# Patient Record
Sex: Female | Born: 1950
Health system: Southern US, Community
[De-identification: ages and names within clinical notes are randomized; demographics above are authoritative.]

## PROBLEM LIST (undated history)

## (undated) DIAGNOSIS — E7800 Pure hypercholesterolemia, unspecified: Secondary | ICD-10-CM

## (undated) DIAGNOSIS — A048 Other specified bacterial intestinal infections: Secondary | ICD-10-CM

## (undated) DIAGNOSIS — Z923 Personal history of irradiation: Secondary | ICD-10-CM

## (undated) DIAGNOSIS — J302 Other seasonal allergic rhinitis: Secondary | ICD-10-CM

## (undated) DIAGNOSIS — M4800 Spinal stenosis, site unspecified: Secondary | ICD-10-CM

## (undated) DIAGNOSIS — M5136 Other intervertebral disc degeneration, lumbar region: Secondary | ICD-10-CM

## (undated) DIAGNOSIS — I10 Essential (primary) hypertension: Secondary | ICD-10-CM

## (undated) DIAGNOSIS — R7303 Prediabetes: Secondary | ICD-10-CM

## (undated) DIAGNOSIS — E785 Hyperlipidemia, unspecified: Secondary | ICD-10-CM

## (undated) DIAGNOSIS — D126 Benign neoplasm of colon, unspecified: Secondary | ICD-10-CM

## (undated) DIAGNOSIS — IMO0001 Reserved for inherently not codable concepts without codable children: Secondary | ICD-10-CM

## (undated) DIAGNOSIS — C801 Malignant (primary) neoplasm, unspecified: Secondary | ICD-10-CM

## (undated) DIAGNOSIS — D563 Thalassemia minor: Secondary | ICD-10-CM

## (undated) DIAGNOSIS — R112 Nausea with vomiting, unspecified: Secondary | ICD-10-CM

## (undated) DIAGNOSIS — D259 Leiomyoma of uterus, unspecified: Secondary | ICD-10-CM

## (undated) DIAGNOSIS — L509 Urticaria, unspecified: Secondary | ICD-10-CM

## (undated) DIAGNOSIS — E039 Hypothyroidism, unspecified: Secondary | ICD-10-CM

## (undated) DIAGNOSIS — D242 Benign neoplasm of left breast: Secondary | ICD-10-CM

## (undated) DIAGNOSIS — M5430 Sciatica, unspecified side: Secondary | ICD-10-CM

## (undated) DIAGNOSIS — M51369 Other intervertebral disc degeneration, lumbar region without mention of lumbar back pain or lower extremity pain: Secondary | ICD-10-CM

## (undated) DIAGNOSIS — T7840XA Allergy, unspecified, initial encounter: Secondary | ICD-10-CM

## (undated) DIAGNOSIS — I4891 Unspecified atrial fibrillation: Secondary | ICD-10-CM

## (undated) DIAGNOSIS — K219 Gastro-esophageal reflux disease without esophagitis: Secondary | ICD-10-CM

## (undated) DIAGNOSIS — Z9889 Other specified postprocedural states: Secondary | ICD-10-CM

## (undated) HISTORY — DX: Nausea with vomiting, unspecified: R11.2

## (undated) HISTORY — DX: Gastro-esophageal reflux disease without esophagitis: K21.9

## (undated) HISTORY — DX: Thalassemia minor: D56.3

## (undated) HISTORY — DX: Other intervertebral disc degeneration, lumbar region: M51.36

## (undated) HISTORY — DX: Hyperlipidemia, unspecified: E78.5

## (undated) HISTORY — DX: Other specified postprocedural states: Z98.890

## (undated) HISTORY — DX: Reserved for inherently not codable concepts without codable children: IMO0001

## (undated) HISTORY — DX: Spinal stenosis, site unspecified: M48.00

## (undated) HISTORY — DX: Hypothyroidism, unspecified: E03.9

## (undated) HISTORY — DX: Personal history of irradiation: Z92.3

## (undated) HISTORY — DX: Allergy, unspecified, initial encounter: T78.40XA

## (undated) HISTORY — DX: Prediabetes: R73.03

## (undated) HISTORY — DX: Urticaria, unspecified: L50.9

## (undated) HISTORY — PX: POLYPECTOMY: SHX149

## (undated) HISTORY — DX: Benign neoplasm of colon, unspecified: D12.6

## (undated) HISTORY — DX: Unspecified atrial fibrillation: I48.91

## (undated) HISTORY — DX: Other seasonal allergic rhinitis: J30.2

## (undated) HISTORY — DX: Leiomyoma of uterus, unspecified: D25.9

## (undated) HISTORY — DX: Benign neoplasm of left breast: D24.2

## (undated) HISTORY — DX: Essential (primary) hypertension: I10

## (undated) HISTORY — DX: Other intervertebral disc degeneration, lumbar region without mention of lumbar back pain or lower extremity pain: M51.369

## (undated) HISTORY — PX: CHOLECYSTECTOMY: SHX55

## (undated) HISTORY — PX: COLONOSCOPY: SHX174

## (undated) HISTORY — DX: Sciatica, unspecified side: M54.30

## (undated) HISTORY — DX: Other specified bacterial intestinal infections: A04.8

---
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
## 2002-01-28 ENCOUNTER — Emergency Department (HOSPITAL_COMMUNITY): Admission: EM | Admit: 2002-01-28 | Discharge: 2002-01-29 | Payer: Self-pay | Admitting: Emergency Medicine

## 2002-01-28 ENCOUNTER — Encounter: Payer: Self-pay | Admitting: Emergency Medicine

## 2002-07-07 ENCOUNTER — Other Ambulatory Visit: Admission: RE | Admit: 2002-07-07 | Discharge: 2002-07-07 | Payer: Self-pay | Admitting: Obstetrics and Gynecology

## 2002-07-27 ENCOUNTER — Emergency Department (HOSPITAL_COMMUNITY): Admission: EM | Admit: 2002-07-27 | Discharge: 2002-07-27 | Payer: Self-pay | Admitting: Emergency Medicine

## 2002-07-27 ENCOUNTER — Encounter: Payer: Self-pay | Admitting: Emergency Medicine

## 2003-04-15 ENCOUNTER — Emergency Department (HOSPITAL_COMMUNITY): Admission: EM | Admit: 2003-04-15 | Discharge: 2003-04-15 | Payer: Self-pay | Admitting: Emergency Medicine

## 2003-04-15 ENCOUNTER — Encounter: Payer: Self-pay | Admitting: Emergency Medicine

## 2003-08-01 ENCOUNTER — Encounter: Payer: Self-pay | Admitting: Emergency Medicine

## 2003-08-01 ENCOUNTER — Emergency Department (HOSPITAL_COMMUNITY): Admission: AD | Admit: 2003-08-01 | Discharge: 2003-08-01 | Payer: Self-pay

## 2003-12-02 ENCOUNTER — Emergency Department (HOSPITAL_COMMUNITY): Admission: EM | Admit: 2003-12-02 | Discharge: 2003-12-03 | Payer: Self-pay

## 2003-12-30 ENCOUNTER — Encounter: Admission: RE | Admit: 2003-12-30 | Discharge: 2003-12-30 | Payer: Self-pay | Admitting: Internal Medicine

## 2004-05-31 ENCOUNTER — Encounter: Admission: RE | Admit: 2004-05-31 | Discharge: 2004-05-31 | Payer: Self-pay | Admitting: Internal Medicine

## 2004-06-01 ENCOUNTER — Encounter: Admission: RE | Admit: 2004-06-01 | Discharge: 2004-06-01 | Payer: Self-pay | Admitting: Internal Medicine

## 2004-10-13 ENCOUNTER — Ambulatory Visit: Payer: Self-pay | Admitting: Internal Medicine

## 2004-12-01 ENCOUNTER — Ambulatory Visit: Payer: Self-pay | Admitting: Internal Medicine

## 2005-03-05 ENCOUNTER — Ambulatory Visit: Payer: Self-pay | Admitting: Internal Medicine

## 2005-07-10 ENCOUNTER — Emergency Department (HOSPITAL_COMMUNITY): Admission: EM | Admit: 2005-07-10 | Discharge: 2005-07-11 | Payer: Self-pay | Admitting: Emergency Medicine

## 2005-07-17 ENCOUNTER — Ambulatory Visit: Payer: Self-pay

## 2005-07-19 ENCOUNTER — Ambulatory Visit: Payer: Self-pay | Admitting: Internal Medicine

## 2005-07-26 ENCOUNTER — Ambulatory Visit: Payer: Self-pay | Admitting: Internal Medicine

## 2005-10-29 ENCOUNTER — Ambulatory Visit: Payer: Self-pay | Admitting: Internal Medicine

## 2005-12-17 ENCOUNTER — Ambulatory Visit: Payer: Self-pay | Admitting: Internal Medicine

## 2005-12-19 ENCOUNTER — Ambulatory Visit: Payer: Self-pay | Admitting: Internal Medicine

## 2006-02-04 ENCOUNTER — Ambulatory Visit: Payer: Self-pay | Admitting: Internal Medicine

## 2006-03-01 ENCOUNTER — Ambulatory Visit: Payer: Self-pay | Admitting: Internal Medicine

## 2006-03-06 ENCOUNTER — Ambulatory Visit: Payer: Self-pay | Admitting: Internal Medicine

## 2006-03-13 ENCOUNTER — Ambulatory Visit: Payer: Self-pay | Admitting: Gastroenterology

## 2006-04-18 ENCOUNTER — Ambulatory Visit: Payer: Self-pay | Admitting: Internal Medicine

## 2006-04-26 ENCOUNTER — Ambulatory Visit: Payer: Self-pay | Admitting: Internal Medicine

## 2006-05-28 ENCOUNTER — Ambulatory Visit: Payer: Self-pay | Admitting: Internal Medicine

## 2006-09-17 ENCOUNTER — Ambulatory Visit: Payer: Self-pay | Admitting: Internal Medicine

## 2006-10-10 DIAGNOSIS — E669 Obesity, unspecified: Secondary | ICD-10-CM | POA: Insufficient documentation

## 2006-10-10 DIAGNOSIS — E785 Hyperlipidemia, unspecified: Secondary | ICD-10-CM | POA: Insufficient documentation

## 2006-10-10 DIAGNOSIS — K8020 Calculus of gallbladder without cholecystitis without obstruction: Secondary | ICD-10-CM | POA: Insufficient documentation

## 2006-10-10 DIAGNOSIS — D259 Leiomyoma of uterus, unspecified: Secondary | ICD-10-CM | POA: Insufficient documentation

## 2006-11-05 ENCOUNTER — Emergency Department (HOSPITAL_COMMUNITY): Admission: EM | Admit: 2006-11-05 | Discharge: 2006-11-06 | Payer: Self-pay | Admitting: Emergency Medicine

## 2006-11-08 ENCOUNTER — Encounter (INDEPENDENT_AMBULATORY_CARE_PROVIDER_SITE_OTHER): Payer: Self-pay | Admitting: *Deleted

## 2006-11-08 ENCOUNTER — Ambulatory Visit: Payer: Self-pay | Admitting: Internal Medicine

## 2006-11-08 LAB — CONVERTED CEMR LAB
ALT: 12 units/L (ref 0–35)
AST: 19 units/L (ref 0–37)
Basophils Absolute: 0 10*3/uL (ref 0.0–0.1)
Basophils percent auto: 0 % (ref 0–1)
Creatinine, Ser: 1 mg/dL (ref 0.40–1.20)
Eosinophils Relative: 1 % (ref 0–4)
HCT: 38.9 % (ref 34.4–43.3)
Hemoglobin: 12.7 g/dL (ref 11.7–14.8)
MCHC: 32.6 g/dL — ABNORMAL LOW (ref 33.1–35.4)
Monocytes Absolute: 0.4 10*3/uL (ref 0.2–0.7)
Neutro Abs: 2.4 10*3/uL (ref 1.8–6.8)
RDW: 18.7 % — ABNORMAL HIGH (ref 11.5–15.3)
Total Bilirubin: 0.8 mg/dL (ref 0.3–1.2)

## 2006-11-11 ENCOUNTER — Encounter (INDEPENDENT_AMBULATORY_CARE_PROVIDER_SITE_OTHER): Payer: Self-pay | Admitting: *Deleted

## 2006-11-11 ENCOUNTER — Ambulatory Visit: Payer: Self-pay | Admitting: Hospitalist

## 2006-11-11 LAB — CONVERTED CEMR LAB
Total CHOL/HDL Ratio: 4.8
VLDL: 13 mg/dL (ref 0–40)

## 2006-12-02 ENCOUNTER — Ambulatory Visit: Payer: Self-pay | Admitting: Internal Medicine

## 2007-01-28 DIAGNOSIS — K219 Gastro-esophageal reflux disease without esophagitis: Secondary | ICD-10-CM | POA: Insufficient documentation

## 2007-03-05 ENCOUNTER — Ambulatory Visit: Payer: Self-pay | Admitting: Internal Medicine

## 2007-05-06 ENCOUNTER — Ambulatory Visit: Payer: Self-pay | Admitting: Internal Medicine

## 2007-05-06 ENCOUNTER — Encounter (INDEPENDENT_AMBULATORY_CARE_PROVIDER_SITE_OTHER): Payer: Self-pay | Admitting: *Deleted

## 2007-05-06 DIAGNOSIS — M25569 Pain in unspecified knee: Secondary | ICD-10-CM | POA: Insufficient documentation

## 2007-05-06 LAB — CONVERTED CEMR LAB
AST: 13 units/L (ref 0–37)
Albumin: 4.3 g/dL (ref 3.5–5.2)
Alkaline Phosphatase: 91 units/L (ref 39–117)
BUN: 12 mg/dL (ref 6–23)
HDL: 72 mg/dL (ref >39)
Ketones, ur: NEGATIVE mg/dL
LDL Cholesterol: 187 mg/dL — ABNORMAL HIGH (ref 0–99)
Nitrite: NEGATIVE
Potassium: 3.7 meq/L (ref 3.5–5.3)
Protein, ur: NEGATIVE mg/dL
Sodium: 140 meq/L (ref 135–145)
Specific Gravity, Urine: 1.025 (ref 1.005–1.03)
TSH: 3.03 microintl units/mL (ref 0.350–5.50)
Total Protein: 7.5 g/dL (ref 6.0–8.3)
Urobilinogen, UA: 1 (ref 0.0–1.0)
VLDL: 16 mg/dL (ref 0–40)

## 2007-05-07 ENCOUNTER — Ambulatory Visit (HOSPITAL_COMMUNITY): Admission: RE | Admit: 2007-05-07 | Discharge: 2007-05-07 | Payer: Self-pay | Admitting: Internal Medicine

## 2007-07-11 ENCOUNTER — Encounter (INDEPENDENT_AMBULATORY_CARE_PROVIDER_SITE_OTHER): Payer: Self-pay | Admitting: *Deleted

## 2007-07-11 ENCOUNTER — Ambulatory Visit: Payer: Self-pay | Admitting: Internal Medicine

## 2007-12-11 ENCOUNTER — Ambulatory Visit: Payer: Self-pay | Admitting: Infectious Diseases

## 2007-12-11 ENCOUNTER — Encounter (INDEPENDENT_AMBULATORY_CARE_PROVIDER_SITE_OTHER): Payer: Self-pay | Admitting: *Deleted

## 2007-12-11 LAB — CONVERTED CEMR LAB
AST: 16 units/L (ref 0–37)
Albumin: 3.9 g/dL (ref 3.5–5.2)
Alkaline Phosphatase: 79 units/L (ref 39–117)
BUN: 15 mg/dL (ref 6–23)
Creatinine, Ser: 0.89 mg/dL (ref 0.40–1.20)
HDL: 71 mg/dL (ref >39)
LDL Cholesterol: 163 mg/dL — ABNORMAL HIGH (ref 0–99)
Potassium: 4.2 meq/L (ref 3.5–5.3)
Total Bilirubin: 0.5 mg/dL (ref 0.3–1.2)
Total CHOL/HDL Ratio: 3.5
VLDL: 13 mg/dL (ref 0–40)

## 2007-12-18 ENCOUNTER — Encounter (INDEPENDENT_AMBULATORY_CARE_PROVIDER_SITE_OTHER): Payer: Self-pay | Admitting: *Deleted

## 2007-12-18 ENCOUNTER — Ambulatory Visit: Payer: Self-pay | Admitting: Hospitalist

## 2007-12-29 ENCOUNTER — Telehealth (INDEPENDENT_AMBULATORY_CARE_PROVIDER_SITE_OTHER): Payer: Self-pay | Admitting: *Deleted

## 2008-01-06 ENCOUNTER — Encounter (INDEPENDENT_AMBULATORY_CARE_PROVIDER_SITE_OTHER): Payer: Self-pay | Admitting: *Deleted

## 2008-02-19 ENCOUNTER — Encounter (INDEPENDENT_AMBULATORY_CARE_PROVIDER_SITE_OTHER): Payer: Self-pay | Admitting: *Deleted

## 2008-02-19 ENCOUNTER — Ambulatory Visit: Payer: Self-pay | Admitting: Internal Medicine

## 2008-02-23 ENCOUNTER — Ambulatory Visit (HOSPITAL_COMMUNITY): Admission: RE | Admit: 2008-02-23 | Discharge: 2008-02-23 | Payer: Self-pay | Admitting: *Deleted

## 2008-02-23 LAB — CONVERTED CEMR LAB
ALT: 10 units/L (ref 0–35)
Alkaline Phosphatase: 83 units/L (ref 39–117)
Creatinine, Ser: 0.93 mg/dL (ref 0.40–1.20)
LDL Cholesterol: 150 mg/dL — ABNORMAL HIGH (ref 0–99)
Sodium: 141 meq/L (ref 135–145)
Total Bilirubin: 0.7 mg/dL (ref 0.3–1.2)
Total CHOL/HDL Ratio: 3.4
Total Protein: 7.1 g/dL (ref 6.0–8.3)
VLDL: 12 mg/dL (ref 0–40)

## 2008-03-03 ENCOUNTER — Encounter (INDEPENDENT_AMBULATORY_CARE_PROVIDER_SITE_OTHER): Payer: Self-pay | Admitting: *Deleted

## 2008-03-31 ENCOUNTER — Ambulatory Visit: Payer: Self-pay | Admitting: Internal Medicine

## 2008-05-03 ENCOUNTER — Telehealth: Payer: Self-pay | Admitting: Internal Medicine

## 2008-06-17 ENCOUNTER — Telehealth: Payer: Self-pay | Admitting: Internal Medicine

## 2008-06-24 ENCOUNTER — Encounter (INDEPENDENT_AMBULATORY_CARE_PROVIDER_SITE_OTHER): Payer: Self-pay | Admitting: *Deleted

## 2008-06-24 ENCOUNTER — Ambulatory Visit: Payer: Self-pay | Admitting: Internal Medicine

## 2008-06-24 LAB — CONVERTED CEMR LAB
AST: 16 units/L (ref 0–37)
Albumin: 4.2 g/dL (ref 3.5–5.2)
Alkaline Phosphatase: 81 units/L (ref 39–117)
BUN: 14 mg/dL (ref 6–23)
HDL: 67 mg/dL (ref >39)
LDL Cholesterol: 143 mg/dL — ABNORMAL HIGH (ref 0–99)
Potassium: 4.1 meq/L (ref 3.5–5.3)
Sodium: 141 meq/L (ref 135–145)
Total Bilirubin: 0.6 mg/dL (ref 0.3–1.2)
Total Protein: 7.1 g/dL (ref 6.0–8.3)
Triglycerides: 61 mg/dL (ref <150)
VLDL: 12 mg/dL (ref 0–40)

## 2008-07-14 ENCOUNTER — Ambulatory Visit: Payer: Self-pay | Admitting: Internal Medicine

## 2008-08-30 ENCOUNTER — Telehealth (INDEPENDENT_AMBULATORY_CARE_PROVIDER_SITE_OTHER): Payer: Self-pay | Admitting: *Deleted

## 2008-10-04 ENCOUNTER — Emergency Department (HOSPITAL_COMMUNITY): Admission: EM | Admit: 2008-10-04 | Discharge: 2008-10-04 | Payer: Self-pay | Admitting: Emergency Medicine

## 2008-10-04 ENCOUNTER — Ambulatory Visit: Payer: Self-pay | Admitting: *Deleted

## 2008-10-29 ENCOUNTER — Telehealth (INDEPENDENT_AMBULATORY_CARE_PROVIDER_SITE_OTHER): Payer: Self-pay | Admitting: *Deleted

## 2008-11-23 ENCOUNTER — Ambulatory Visit: Payer: Self-pay | Admitting: *Deleted

## 2009-03-12 ENCOUNTER — Emergency Department (HOSPITAL_COMMUNITY): Admission: EM | Admit: 2009-03-12 | Discharge: 2009-03-12 | Payer: Self-pay | Admitting: Family Medicine

## 2009-03-17 ENCOUNTER — Telehealth: Payer: Self-pay | Admitting: *Deleted

## 2009-05-31 ENCOUNTER — Ambulatory Visit: Payer: Self-pay | Admitting: Internal Medicine

## 2009-05-31 ENCOUNTER — Encounter (INDEPENDENT_AMBULATORY_CARE_PROVIDER_SITE_OTHER): Payer: Self-pay | Admitting: *Deleted

## 2009-06-10 ENCOUNTER — Ambulatory Visit: Payer: Self-pay | Admitting: Internal Medicine

## 2009-06-10 ENCOUNTER — Encounter (INDEPENDENT_AMBULATORY_CARE_PROVIDER_SITE_OTHER): Payer: Self-pay | Admitting: *Deleted

## 2009-06-10 LAB — CONVERTED CEMR LAB
CO2: 25 meq/L (ref 19–32)
Calcium: 9.1 mg/dL (ref 8.4–10.5)
Cholesterol: 221 mg/dL — ABNORMAL HIGH (ref 0–200)
Creatinine, Ser: 0.96 mg/dL (ref 0.40–1.20)
GFR calc non Af Amer: 60 mL/min — ABNORMAL LOW (ref >60)
Sodium: 144 meq/L (ref 135–145)
Triglycerides: 65 mg/dL (ref <150)
VLDL: 13 mg/dL (ref 0–40)

## 2009-06-13 ENCOUNTER — Ambulatory Visit: Payer: Self-pay | Admitting: Internal Medicine

## 2009-07-01 ENCOUNTER — Telehealth: Payer: Self-pay | Admitting: *Deleted

## 2009-07-01 ENCOUNTER — Ambulatory Visit: Payer: Self-pay | Admitting: Infectious Diseases

## 2009-07-01 ENCOUNTER — Encounter: Payer: Self-pay | Admitting: Internal Medicine

## 2009-07-01 DIAGNOSIS — R109 Unspecified abdominal pain: Secondary | ICD-10-CM | POA: Insufficient documentation

## 2009-07-01 LAB — CONVERTED CEMR LAB
Bilirubin Urine: NEGATIVE
Gardnerella vaginalis: NEGATIVE
Ketones, ur: NEGATIVE mg/dL
Protein, ur: NEGATIVE mg/dL
Urobilinogen, UA: 0.2 (ref 0.0–1.0)

## 2009-07-18 ENCOUNTER — Encounter: Payer: Self-pay | Admitting: Internal Medicine

## 2009-08-04 ENCOUNTER — Ambulatory Visit (HOSPITAL_COMMUNITY): Admission: RE | Admit: 2009-08-04 | Discharge: 2009-08-04 | Payer: Self-pay | Admitting: Internal Medicine

## 2009-08-09 ENCOUNTER — Telehealth: Payer: Self-pay | Admitting: Internal Medicine

## 2009-09-13 ENCOUNTER — Encounter: Payer: Self-pay | Admitting: Internal Medicine

## 2009-11-08 ENCOUNTER — Telehealth: Payer: Self-pay | Admitting: Internal Medicine

## 2009-11-29 ENCOUNTER — Encounter: Payer: Self-pay | Admitting: Internal Medicine

## 2010-02-23 ENCOUNTER — Ambulatory Visit: Payer: Self-pay | Admitting: Internal Medicine

## 2010-02-23 DIAGNOSIS — M545 Low back pain, unspecified: Secondary | ICD-10-CM | POA: Insufficient documentation

## 2010-02-23 LAB — CONVERTED CEMR LAB
Albumin: 4 g/dL (ref 3.5–5.2)
CO2: 27 meq/L (ref 19–32)
Cholesterol: 219 mg/dL — ABNORMAL HIGH (ref 0–200)
Glucose, Bld: 82 mg/dL (ref 70–99)
LDL Cholesterol: 146 mg/dL — ABNORMAL HIGH (ref 0–99)
Potassium: 4.3 meq/L (ref 3.5–5.3)
Sodium: 139 meq/L (ref 135–145)
Total Protein: 6.9 g/dL (ref 6.0–8.3)
Triglycerides: 58 mg/dL (ref <150)

## 2010-03-24 ENCOUNTER — Telehealth: Payer: Self-pay | Admitting: Internal Medicine

## 2010-04-07 ENCOUNTER — Telehealth: Payer: Self-pay | Admitting: Internal Medicine

## 2010-06-20 ENCOUNTER — Encounter: Payer: Self-pay | Admitting: Internal Medicine

## 2010-07-27 ENCOUNTER — Encounter: Admission: RE | Admit: 2010-07-27 | Discharge: 2010-07-27 | Payer: Self-pay | Admitting: Internal Medicine

## 2010-09-14 ENCOUNTER — Ambulatory Visit: Payer: Self-pay | Admitting: Internal Medicine

## 2010-09-14 LAB — CONVERTED CEMR LAB
HDL: 67 mg/dL (ref >39)
Total CHOL/HDL Ratio: 3.5
Triglycerides: 73 mg/dL (ref <150)

## 2011-01-04 ENCOUNTER — Emergency Department (HOSPITAL_COMMUNITY)
Admission: EM | Admit: 2011-01-04 | Discharge: 2011-01-04 | Payer: Self-pay | Source: Home / Self Care | Admitting: Emergency Medicine

## 2011-01-08 ENCOUNTER — Ambulatory Visit: Admission: RE | Admit: 2011-01-08 | Discharge: 2011-01-08 | Payer: Self-pay | Source: Home / Self Care

## 2011-01-08 DIAGNOSIS — G5620 Lesion of ulnar nerve, unspecified upper limb: Secondary | ICD-10-CM | POA: Insufficient documentation

## 2011-01-08 DIAGNOSIS — J069 Acute upper respiratory infection, unspecified: Secondary | ICD-10-CM | POA: Insufficient documentation

## 2011-01-22 LAB — CONVERTED CEMR LAB
ALT: 12 units/L (ref 0–35)
AST: 15 units/L (ref 0–37)
Alkaline Phosphatase: 90 units/L (ref 39–117)
CO2: 27 meq/L (ref 19–32)
Cholesterol: 245 mg/dL — ABNORMAL HIGH (ref 0–200)
LDL Cholesterol: 168 mg/dL — ABNORMAL HIGH (ref 0–99)
MCV: 75.8 fL — ABNORMAL LOW (ref 78.0–100.0)
Platelets: 241 10*3/uL (ref 150–400)
RDW: 16.3 % — ABNORMAL HIGH (ref 11.5–15.5)
Sodium: 141 meq/L (ref 135–145)
Total Bilirubin: 0.5 mg/dL (ref 0.3–1.2)
Total CHOL/HDL Ratio: 4
Total Protein: 6.8 g/dL (ref 6.0–8.3)
VLDL: 15 mg/dL (ref 0–40)
WBC: 5.2 10*3/uL (ref 4.0–10.5)

## 2011-01-30 NOTE — Medication Information (Signed)
Summary: [facility]   Imported By: Margie Billet 07/10/2010 15:58:32  _____________________________________________________________________  External Attachment:    Type:   Image     Comment:   External Document

## 2011-01-30 NOTE — Assessment & Plan Note (Signed)
Summary: CHECKUP/SB.   Vital Signs:  Patient profile:   61 year old female Menstrual status:  postmenopausal Height:      64 inches Weight:      247.4 pounds BMI:     42.62 Temp:     97.8 degrees F oral Pulse rate:   80 / minute BP sitting:   143 / 88  (right arm)  Vitals Entered By: Filomena Jungling NT II (September 14, 2010 1:45 PM) CC: checkup Is Patient Diabetic? No Pain Assessment Patient in pain? no      Nutritional Status BMI of > 30 = obese  Have you ever been in a relationship where you felt threatened, hurt or afraid?No   Does patient need assistance? Functional Status Self care Ambulation Normal   CC:  checkup.  History of Present Illness: Tina Stone is a 61 year old woman with pmh significant for Uterine Fibroids, HLD and GERD who presents to the clinic today for a general check-up.   Pt states she is feeling great. She has started swimming every morning. She also changed her diet to fish and chicken and increased her vegetable intake for the past 3 weeks.  She is at a new job and is enjoying it. No further episodes of back pain.      Preventive Screening-Counseling & Management  Alcohol-Tobacco     Alcohol drinks/day: 0     Smoking Status: quit     Year Quit: years ago-teenager  Caffeine-Diet-Exercise     Caffeine use/day: very little     Does Patient Exercise: yes before the back pain staarted     Type of exercise: walking,jogging     Exercise (avg: min/session): 30-60     Times/week: 5  Current Medications (verified): 1)  Pravachol 10 Mg Tabs (Pravastatin Sodium) .... Take 1 Tablet By Mouth Once A Day 2)  Calcium Carbonate 600 Mg Tabs (Calcium Carbonate) 3)  Garlic Oil 500 Mg Tabs (Garlic) .... Once Daily  Allergies (verified): 1)  ! * Shellfish  Past History:  Past Medical History: Last updated: 07/01/2009 GERD      resolved w/ PPI but stopped Prilosec b/c concerned about possible heart side-effects (TV)      resolved with lose of  weight Hyperlipidemia      Hypertension      Resolved after losing weight Cardiomegaly as per x-ray 2004 Cholelithiasis      Mild biliary colic Obesity      losing wt. 253 (Mar 2008) 245 (Jul 08) fibroids Health screening      Scheduling a Colonoscopy Mar 09  Family History: Last updated: 07/01/2009 Family History Diabetes 1st degree relative Family History Hypertension  Social History: Last updated: 09/14/2010 Widow/Widower Currently employed as caregiver at [employer] No tobacco, EtOH, or illicit drug use Regular exercise  Risk Factors: Alcohol Use: 0 (09/14/2010) Caffeine Use: very little (09/14/2010) Exercise: yes before the back pain staarted (09/14/2010)  Risk Factors: Smoking Status: quit (09/14/2010)  Social History: Widow/Widower Currently employed as caregiver at [employer] No tobacco, EtOH, or illicit drug use Regular exercise  Review of Systems      See HPI  Physical Exam  General:  alert and well-developed.   Head:  normocephalic and atraumatic.   Neck:  supple.   Lungs:  normal respiratory effort and normal breath sounds.   Heart:  normal rate and regular rhythm.   Abdomen:  soft, non-tender, and normal bowel sounds.   Msk:  normal ROM.  Pulses:  equal bilat Extremities:  no edema  Psych:  normally interactive and good eye contact.     Impression & Recommendations:  Problem # 1:  BACK PAIN, LUMBAR (ICD-724.2) resolved. Was work related, now at different job.   The following medications were removed from the medication list:    Flexeril 5 Mg Tabs (Cyclobenzaprine hcl) .Marland Kitchen... Take 1 tab by mouth at bedtime  Problem # 2:  HYPERLIPIDEMIA (ICD-272.4) Check lipid profile today and continue current regimen.   Her updated medication list for this problem includes:    Pravachol 10 Mg Tabs (Pravastatin sodium) .Marland Kitchen... Take 1 tablet by mouth once a day  Orders: T-Lipid Profile [phone])  Labs Reviewed: SGOT: 18  (02/23/2010)   SGPT: 12 (02/23/2010)   HDL:61 (02/23/2010), 62 (06/10/2009)  LDL:146 (02/23/2010), 146 (06/10/2009)  Chol:219 (02/23/2010), 221 (06/10/2009)  Trig:58 (02/23/2010), 65 (06/10/2009)  Problem # 3:  Preventive Health Care (ICD-V70.0) flu shot today PAP will be scheduled at next office visit.   Complete Medication List: 1)  Pravachol 10 Mg Tabs (Pravastatin sodium) .... Take 1 tablet by mouth once a day 2)  Calcium Carbonate 600 Mg Tabs (Calcium carbonate) 3)  Garlic Oil 500 Mg Tabs (Garlic) .... Once daily  Patient Instructions: 1)  Please schedule a follow-up appointment in 6 months. 2)  Please come in fasting for your appointment. 3)  Please have PAP Smear done at next office visit.  Process Orders Check Orders Results:     [facility]: ABN not required for this insurance Tests Sent for requisitioning (September 14, 2010 2:14 PM):     09/14/2010: [facility] -- T-Lipid Profile [phone] (signed)     Process Orders Check Orders Results:     [facility]: ABN not required for this insurance Tests Sent for requisitioning (September 14, 2010 2:14 PM):     09/14/2010: [facility] -- T-Lipid Profile [phone] (signed)     Prevention & Chronic Care Immunizations   Influenza vaccine: Not documented   Influenza vaccine deferral: Refused  (02/23/2010)    Tetanus booster: Not documented    Pneumococcal vaccine: Not documented  Colorectal Screening   Hemoccult: Not documented    Colonoscopy: Not documented  Other Screening   Pap smear: Not documented   Pap smear action/deferral: Refused  (09/14/2010)    Mammogram: BI-RADS CATEGORY 2:  Benign finding(s).^MM DIGITAL DIAGNOSTIC BILAT  (07/27/2010)   Mammogram action/deferral: Deferred  (02/23/2010)   Smoking status: quit  (09/14/2010)    Screening comments: States she will get one the next time she comes  Lipids   Total Cholesterol: 219   (02/23/2010)   Lipid panel action/deferral: Lipid Panel ordered   LDL: 146  (02/23/2010)   LDL Direct: Not documented   HDL: 61  (02/23/2010)   Triglycerides: 58  (02/23/2010)    SGOT (AST): 18  (02/23/2010)   BMP action: Ordered   SGPT (ALT): 12  (02/23/2010)   Alkaline phosphatase: 76  (02/23/2010)   Total bilirubin: 0.6  (02/23/2010)    Lipid flowsheet reviewed?: Yes   Progress toward LDL goal: At goal  Self-Management Support :   Personal Goals (by the next clinic visit) :      Personal LDL goal: 160  (02/23/2010)    Lipid self-management support: Written self-care plan, Education handout, Resources for patients handout  (02/23/2010)    Nursing Instructions: Give Flu vaccine today   Appended Document: flu vaccine//kg    Nurse Visit   Allergies: 1)  ! *  Shellfish  Orders Added: 1)  Admin 1st Vaccine [90471] 2)  Flu Vaccine 58yrs + [16109] Flu Vaccine Consent Questions     Do you have a history of severe allergic reactions to this vaccine? no    Any prior history of allergic reactions to egg and/or gelatin? no    Do you have a sensitivity to the preservative Thimersol? no    Do you have a past history of Guillan-Barre Syndrome? no    Do you currently have an acute febrile illness? no    Have you ever had a severe reaction to latex? no    Vaccine information given and explained to patient? yes    Are you currently pregnant? no    Lot Number:[id number]   Exp Date:06/30/2011   Manufacturer: [employer]    Site Given  Right Deltoid IM .Cynda Familia [facility])  September 14, 2010 2:15 PM  .opcflu

## 2011-01-30 NOTE — Progress Notes (Signed)
Summary: mammogram referral/gp  Phone Note Call from Patient   Summary of Call: Pt. called requesting a mammogram referral which is due in April.  Thanks Initial call taken by: Chinita Pester RN,  March 24, 2010 3:30 PM  Follow-up for Phone Call        Order given for mammogram.  Follow-up by: Melida Quitter MD,  March 26, 2010 11:06 PM  Additional Follow-up for Phone Call Additional follow up Details #1::        I talked to pt. She had been going to [facility] since the program thru the [facility] has no money, she wanted the mammogram schedu[facility]. Yolanda Bonine stated her last one was July 20,2010. Appt[facility]scheduled  at [facility] July 28. Additional Follow-up by: Chinita Pester RN,  March 29, 2010 2:08 PM

## 2011-01-30 NOTE — Assessment & Plan Note (Signed)
Summary: CHECKUP/SB.   Vital Signs:  Patient profile:   61 year old female Menstrual status:  postmenopausal Height:      64 inches (162.56 cm) Weight:      242.7 pounds (110.32 kg) BMI:     41.81 Temp:     97.0 degrees F (36.11 degrees C) oral Pulse rate:   78 / minute BP sitting:   140 / 84  (right arm)  Vitals Entered By: Chinita Pester RN (February 23, 2010 10:35 AM) CC: Lower back pain; started new job 4 weeks ago - lifting heavy items. Is Patient Diabetic? No Pain Assessment Patient in pain? yes     Location: back Intensity: 5 Type: aching Onset of pain  Intermittent Nutritional Status BMI of > 30 = obese  Have you ever been in a relationship where you felt threatened, hurt or afraid?No   Does patient need assistance? Functional Status Self care Ambulation Normal   CC:  Lower back pain; started new job 4 weeks ago - lifting heavy items..  History of Present Illness: Tina Stone is a 61 year old woman with pmh significant for Uterine Fibroids, HLD and GERD who presents to the clinic today for a general check-up.  1) Uterine Fibroids - Transvaginal and pelvic U/S revealed fibroid uterus, endometrium not visualized due to fibroids, and ovaries not visualized also. No other intervention was done. No further bleeding or spotting. Pt currently not experiencing any pain.   2) Left breast mass - Intraductal papilloma as seen by U/S guided biopsy. Patient was referred to [facility] [place] [facility] for further evaluation where excision was recommended. However, patient did not follow-up with excision. Pt scheduled to be seen by breast center April 2011.   3) Back pain - Patient reports she started a new job at [employer] where she is lifting 30-50 pounds daily. Patient reports she has been working for 4 weeks now. The pain is worse at night. Located over lower back, and in knees. Patient has support hoes and good shoes. Pt went to HR and there is no light duty for the job. She also  reports using both heating pads and cold packs, and the pain would temporarily be relieved but as soon as she is at work, the pain starts back up. Patient describes the pain as more achy than crampy, or spasm like. Pain is better when walking, worse with bending and getting up from chair. No fevers, chills. Patient reports she is lifting properly, and not straining her back.   Preventive Screening-Counseling & Management  Alcohol-Tobacco     Alcohol drinks/day: 0     Smoking Status: quit     Year Quit: years ago-teenager  Caffeine-Diet-Exercise     Caffeine use/day: very little     Does Patient Exercise: yes before the back pain staarted     Type of exercise: walking,jogging     Exercise (avg: min/session): 30-60     Times/week: 5  Current Medications (verified): 1)  Pravachol 10 Mg Tabs (Pravastatin Sodium) .... Take 1 Tablet By Mouth Once A Day 2)  Calcium Carbonate 600 Mg Tabs (Calcium Carbonate) 3)  Garlic Oil 500 Mg Tabs (Garlic) .... Once Daily 4)  Flexeril 5 Mg Tabs (Cyclobenzaprine Hcl) .... Take 1 Tab By Mouth At Bedtime  Allergies (verified): 1)  ! * Shellfish  Social History: Widow/Widower Currently employed at [employer], Orthoptist storageDoes Patient Exercise:  yes before the back pain staarted  Review of Systems General:  Denies chills, fatigue,  and fever. CV:  Denies chest pain or discomfort, difficulty breathing at night, difficulty breathing while lying down, and swelling of feet. GI:  Denies abdominal pain, change in bowel habits, dark tarry stools, diarrhea, nausea, and vomiting. GU:  Denies abnormal vaginal bleeding, dysuria, urinary frequency, and urinary hesitancy. MS:  Complains of low back pain, muscle aches, stiffness, and thoracic pain. Neuro:  Denies numbness and tingling.  Physical Exam  General:  alert and well-developed.   Head:  normocephalic and atraumatic.   Eyes:  vision grossly intact, pupils equal, pupils round, and pupils reactive  to light.   Mouth:  good dentition and pharynx pink and moist.   Neck:  supple, full ROM, and no masses.   Lungs:  normal respiratory effort and normal breath sounds.   Heart:  normal rate, regular rhythm, no murmur, no gallop, no rub, and no JVD.   Abdomen:  soft, non-tender, normal bowel sounds, no distention, no masses, and no guarding.   Msk:  difficulty getting up from chair no joint tenderness, no joint swelling, no joint warmth, no redness over joints, and no joint deformities.   Pulses:  R radial normal and L radial normal.   Extremities:  no lower extremity edema noted  Neurologic:  alert & oriented X3, cranial nerves II-XII intact, strength normal in all extremities, and sensation intact to light touch.     Impression & Recommendations:  Problem # 1:  BACK PAIN, LUMBAR (ICD-724.2) Assessment New Back pain is secondary to lumbar strain due to nature of her job. Patient is required to lift 30-50lbs as part of her job. As mentioned in HPI, there is no light duty available and patient would have to resign, and reapply for another job that is not strenous on her back and joints. Patient is aware of her options, and states she will likely resign, as her back pain is too significant. It is not associated with any neurologic deficits, no numbness, tingling, sensation is intact, gait normal, no urinary incontinence, no fever or chills. No imaging study is required at this time. Advised patient to continue using heating pads, as well as Ibuprofen for pain management and inflammation. Will also give patient Flexeril.  A work note was also provided to the patient today.   Her updated medication list for this problem includes:    Flexeril 5 Mg Tabs (Cyclobenzaprine hcl) .Marland Kitchen... Take 1 tab by mouth at bedtime  Problem # 2:  HYPERLIPIDEMIA (ICD-272.4) Assessment: Improved Lipids are well controlled, will check Lipid panel and LFTs today.   Her updated medication list for this problem includes:     Pravachol 10 Mg Tabs (Pravastatin sodium) .Marland Kitchen... Take 1 tablet by mouth once a day  Orders: T-Lipid Profile ([id number])  Problem # 3:  PELVIC  PAIN (ICD-789.09) Assessment: Comment Only Uterine fibroids as indicated by U/S. Patient reports pelvic pain and bleeding has resolved. No further intervention was done, however patient reports using herbal supplements which has controlled her symptoms pretty well.   Her updated medication list for this problem includes:    Flexeril 5 Mg Tabs (Cyclobenzaprine hcl) .Marland Kitchen... Take 1 tab by mouth at bedtime  Problem # 4:  Preventive Health Care (ICD-V70.0) Immunizations and screening tests reviewed. PAP last done 06/2009. Patient is followed by breast center and is scheduled for mammogram and check-up in April 2011.   Complete Medication List: 1)  Pravachol 10 Mg Tabs (Pravastatin sodium) .... Take 1 tablet by mouth once a day 2)  Calcium  Carbonate 600 Mg Tabs (Calcium carbonate) 3)  Garlic Oil 500 Mg Tabs (Garlic) .... Once daily 4)  Flexeril 5 Mg Tabs (Cyclobenzaprine hcl) .... Take 1 tab by mouth at bedtime  Other Orders: T-Comprehensive Metabolic Panel ([id number])  Patient Instructions: 1)  Please schedule a follow-up appointment in 6 months. Prescriptions: FLEXERIL 5 MG TABS (CYCLOBENZAPRINE HCL) Take 1 tab by mouth at bedtime  #30 x 0   Entered and Authorized by:   Melida Quitter MD   Signed by:   Melida Quitter MD on 02/23/2010   Method used:   Print then Give to Patient   RxID:   [id number]   Prevention & Chronic Care Immunizations   Influenza vaccine: Not documented   Influenza vaccine deferral: Refused  (02/23/2010)    Tetanus booster: Not documented    Pneumococcal vaccine: Not documented  Colorectal Screening   Hemoccult: Not documented    Colonoscopy: Not documented  Other Screening   Pap smear: Not documented   Pap smear action/deferral: Deferred  (02/23/2010)    Mammogram: Not documented   Mammogram  action/deferral: Deferred  (02/23/2010)   Smoking status: quit  (02/23/2010)  Lipids   Total Cholesterol: 221  (06/10/2009)   Lipid panel action/deferral: Lipid Panel ordered   LDL: 146  (06/10/2009)   LDL Direct: Not documented   HDL: 62  (06/10/2009)   Triglycerides: 65  (06/10/2009)    SGOT (AST): 16  (06/24/2008)   BMP action: Ordered   SGPT (ALT): 12  (06/24/2008) CMP ordered    Alkaline phosphatase: 81  (06/24/2008)   Total bilirubin: 0.6  (06/24/2008)    Lipid flowsheet reviewed?: Yes   Progress toward LDL goal: Unchanged  Self-Management Support :   Personal Goals (by the next clinic visit) :      Personal LDL goal: 160  (02/23/2010)    Patient will work on the following items until the next clinic visit to reach self-care goals:     Medications and monitoring: take my medicines every day  (02/23/2010)     Eating: drink diet soda or water instead of juice or soda, eat more vegetables, use fresh or frozen vegetables, eat foods that are low in salt, eat baked foods instead of fried foods, eat fruit for snacks and desserts, limit or avoid alcohol  (02/23/2010)    Lipid self-management support: Written self-care plan, Education handout, Resources for patients handout  (02/23/2010)   Lipid self-care plan printed.   Lipid education handout printed      Resource handout printed.  Process Orders Check Orders Results:     [facility]: ABN not required for this insurance Tests Sent for requisitioning (February 23, 2010 11:23 AM):     02/23/2010: [facility] -- T-Lipid Profile [phone] (signed)     02/23/2010: [facility] -- T-Comprehensive Metabolic Panel [phone] (signed)   Process Orders Check Orders Results:     [facility]: ABN not required for this insurance Tests Sent for requisitioning (February 23, 2010 11:23 AM):     02/23/2010: [facility] -- T-Lipid Profile  [phone] (signed)     02/23/2010: [facility] -- T-Comprehensive Metabolic Panel [phone] (signed)

## 2011-01-30 NOTE — Progress Notes (Signed)
Summary: med refill/gp  Phone Note Refill Request Message from:  Patient on April 07, 2010 4:10 PM  Refills Requested: Medication #1:  PRAVACHOL 10 MG TABS Take 1 tablet by mouth once a day  Method Requested: Electronic Initial call taken by: Chinita Pester RN,  April 07, 2010 4:10 PM  Follow-up for Phone Call        Rx called to pharmacy Follow-up by: Melida Quitter MD,  April 07, 2010 7:54 PM    Prescriptions: PRAVACHOL 10 MG TABS (PRAVASTATIN SODIUM) Take 1 tablet by mouth once a day  #30 x 11   Entered and Authorized by:   Melida Quitter MD   Signed by:   Melida Quitter MD on 04/07/2010   Method used:   Electronically to        [facility]  [address] [phone]* (retail)       [address]       [place], [place]  [address]       Ph: [phone]       Fax: [phone]   RxID:   [id number]

## 2011-02-01 NOTE — Assessment & Plan Note (Addendum)
Summary: LUMP IN BREAST AND TENDERNESS/SIDHU/DS   Vital Signs:  Patient profile:   61 year old female Menstrual status:  postmenopausal Height:      64 inches (162.56 cm) Weight:      255.8 pounds (116.27 kg) BMI:     44.07 Temp:     98.5 degrees F (36.94 degrees C) oral Pulse rate:   72 / minute BP sitting:   149 / 87  (left arm)  Vitals Entered By: Stanton Kidney Ditzler RN (January 08, 2011 8:51 AM) Is Patient Diabetic? No Pain Assessment Patient in pain? no      Nutritional Status BMI of > 30 = obese Nutritional Status Detail appetite good  Have you ever been in a relationship where you felt threatened, hurt or afraid?denies   Does patient need assistance? Functional Status Self care Ambulation Normal Comments ER FU last week - numbness left arm. Pt is fasting - wants labs. Past 2 days hoarse and clear to gray productive cough. About 1 1/2 weeks ago tenderness left breast - better.   Primary Care Provider:  Melida Quitter MD   History of Present Illness: 61yo W with HL, obesity, GERD presents for evaluation of: 1. Cold. Sore throat, cough, sinus congestion for past 3-4 days. Recently saw cousin who has similar symptoms. Cough is productive of scant clear-gray sputum and is increasingly bothersome, making her hoarse and making it difficult to sleep at night.   2. L arm tingling. Tingling/numbness in L hand/forearm has occurred intermittently for past few weeks. These episodes typically occur after she has been resting on her L elbow. The sensation in that arm returns to normal in about . She went to the ED last week for evaluation of these symptoms but has had no more such episodes in the past few days. She is left hand dominant and reports heavy use of that arm (does lots of swimming).  3. L breast tenderness. Made the appointment because of L breast tenderness for the past few weeks. No masses, skin changes, nipple discharge, etc. She had a normal mammogram a few months ago (she  gets mammograms annually). She now attributes the breast tenderness to caffeine use, as the tenderness resolved several days ago after quitting caffeine.   Depression History:      The patient denies a depressed mood most of the day and a diminished interest in her usual daily activities.         Preventive Screening-Counseling & Management  Alcohol-Tobacco     Alcohol drinks/day: 0     Smoking Status: quit     Year Quit: years ago-teenager  Caffeine-Diet-Exercise     Caffeine use/day: very little     Does Patient Exercise: yes before the back pain staarted     Type of exercise: walking,jogging     Exercise (avg: min/session): 30-60     Times/week: 5  Current Medications (verified): 1)  Pravachol 10 Mg Tabs (Pravastatin Sodium) .... Take 1 Tablet By Mouth Once A Day 2)  Calcium Carbonate 600 Mg Tabs (Calcium Carbonate) 3)  Garlic Oil 500 Mg Tabs (Garlic) .... Once Daily 4)  Cheratussin Ac 100-10 Mg/1ml Syrp (Guaifenesin-Codeine) .... Take 5ml By Mouth Before Bed As Needed For Cough  Allergies: 1)  ! * Shellfish  Past History:  Past Medical History: Last updated: 07/01/2009 GERD      resolved w/ PPI but stopped Prilosec b/c concerned about possible heart side-effects (TV)      resolved with lose of weight  Hyperlipidemia      Hypertension      Resolved after losing weight Cardiomegaly as per x-ray 2004 Cholelithiasis      Mild biliary colic Obesity      losing wt. 253 (Mar 2008) 245 (Jul 08) fibroids Health screening      Scheduling a Colonoscopy Mar 09  Family History: Last updated: 07/01/2009 Family History Diabetes 1st degree relative Family History Hypertension  Social History: Last updated: 09/14/2010 Widow/Widower Currently employed as caregiver at [employer] No tobacco, EtOH, or illicit drug use Regular exercise  Review of Systems      See HPI General:  Complains of malaise; denies chills and fever. ENT:  Complains of nasal congestion,  sinus pressure, and sore throat. CV:  Denies chest pain or discomfort. Resp:  Complains of cough; denies shortness of breath. GI:  Denies abdominal pain and change in bowel habits. Neuro:  Complains of numbness and tingling; denies brief paralysis, visual disturbances, and weakness.  Physical Exam  General:  alert and cooperative to examination.   Head:  normocephalic and atraumatic.   Eyes:  vision grossly intact, pupils equal, pupils round, and pupils reactive to light.   Mouth:  pharynx pink and moist, no erythema, and no exudates.   Neck:  supple and no masses.   Breasts:  skin/areolae normal, no masses, and no tenderness.   Lungs:  normal breath sounds, no crackles, and no wheezes.   Heart:  normal rate, regular rhythm, no murmur, no gallop, and no rub.   Abdomen:  soft and non-tender.   Extremities:  No edema.  Neurologic:  alert & oriented X3, cranial nervesgrossly intact, strength normal in all extremities, and sensation intact to light touch.   Skin:  turgor normal and no rashes.   Psych:  Oriented X3, memory intact for recent and remote, normally interactive, good eye contact, not anxious appearing, and not depressed appearing.     Impression & Recommendations:  Problem # 1:  UPPER RESPIRATORY INFECTION (ICD-465.9) Symptoms consistent with common cold. Advised patient to rest, drink lots of fluids. Will prescribe Cheratussin to help relieve coughing at night. She is instructed to call our office if symptoms do not improve.   Her updated medication list for this problem includes:    Cheratussin Ac 100-10 Mg/66ml Syrp (Guaifenesin-codeine) .Marland Kitchen... Take 5ml by mouth before bed as needed for cough  Problem # 2:  ULNAR NERVE ENTRAPMENT (ICD-354.2) Symptoms of transient, intermittent numbness/tingling in L hand that is positional (occurs after resting on elbow) is consistent with mild ulnar nerve compression. Patient was advised to avoid overuse of that arm and avoid positions that  cause paresthesias. Will monitor conservatively at this point.   Problem # 3:  HYPERLIPIDEMIA (ICD-272.4) Patient fasting today. Lipids elevated when last checked but patient reports not fasting at that time. Will recheck lipid panel, CMET today.   Her updated medication list for this problem includes:    Pravachol 10 Mg Tabs (Pravastatin sodium) .Marland Kitchen... Take 1 tablet by mouth once a day  Orders: T-Lipid Profile ([id number]) T-Comprehensive Metabolic Panel ([id number])  Problem # 4:  Preventive Health Care (ICD-V70.0) Breast tenderness has resolved completely. No abnormalities on breast exam. Patient reassured and encouraged to continue annual screening mammography.   Complete Medication List: 1)  Pravachol 10 Mg Tabs (Pravastatin sodium) .... Take 1 tablet by mouth once a day 2)  Calcium Carbonate 600 Mg Tabs (Calcium carbonate) 3)  Garlic Oil 500 Mg Tabs (Garlic) .... Once daily 4)  Cheratussin Ac 100-10 Mg/46ml Syrp (Guaifenesin-codeine) .... Take 5ml by mouth before bed as needed for cough  Other Orders: T-CBC No Diff ([id number])  Patient Instructions: 1)  Please call our office if your symptoms worsen or do not get better over the next 1-2 weeks. 2)  Get plenty of rest, drink lots of clear liquids (broth based soups are great!), and use Tylenol or Ibuprofen for fever and comfort. Return in 7-10 days if you're not better:sooner if you're feeling worse. 3)  Over the counter cough syrups that contain dextromethorphan may also help your cough.  Prescriptions: CHERATUSSIN AC 100-10 MG/5ML SYRP (GUAIFENESIN-CODEINE) Take 5mL by mouth before bed as needed for cough  #1 bottle x 0   Entered and Authorized by:   Whitney Post MD   Signed by:   Whitney Post MD on 01/08/2011   Method used:   Print then Give to Patient   RxID:   [id number]    Orders Added: 1)  T-Lipid Profile [[id number]] 2)  T-Comprehensive Metabolic Panel [[id number]] 3)  T-CBC No Diff [[id number]] 4)   Est. Patient Level IV [44010]   Process Orders Check Orders Results:     [facility]: ABN not required for this insurance Tests Sent for requisitioning (January 08, 2011 9:01 PM):     01/08/2011: [facility] -- T-Lipid Profile [id number] (signed)     01/08/2011: [facility] -- T-Comprehensive Metabolic Panel [[id number]] (signed)     01/08/2011: [facility] -- T-CBC No Diff [[id number]] (signed)     Prevention & Chronic Care Immunizations   Influenza vaccine: Fluvax 3+  (09/14/2010)   Influenza vaccine deferral: Refused  (02/23/2010)    Tetanus booster: Not documented    Pneumococcal vaccine: Not documented  Colorectal Screening   Hemoccult: Not documented    Colonoscopy: Not documented  Other Screening   Pap smear: Not documented   Pap smear action/deferral: Refused  (09/14/2010)    Mammogram: BI-RADS CATEGORY 2:  Benign finding(s).^MM DIGITAL DIAGNOSTIC BILAT  (07/27/2010)   Mammogram action/deferral: Deferred  (02/23/2010)   Smoking status: quit  (01/08/2011)  Lipids   Total Cholesterol: 236  (09/14/2010)   Lipid panel action/deferral: Lipid Panel ordered   LDL: 154  (09/14/2010)   LDL Direct: Not documented   HDL: 67  (09/14/2010)   Triglycerides: 73  (09/14/2010)    SGOT (AST): 18  (02/23/2010)   BMP action: Ordered   SGPT (ALT): 12  (02/23/2010) CMP ordered    Alkaline phosphatase: 76  (02/23/2010)   Total bilirubin: 0.6  (02/23/2010)    Lipid flowsheet reviewed?: Yes   Progress toward LDL goal: Unchanged  Self-Management Support :   Personal Goals (by the next clinic visit) :      Personal LDL goal: 160  (02/23/2010)    Patient will work on the following items until the next clinic visit to reach self-care goals:     Medications and monitoring: take my medicines every day, check my blood pressure, bring all of my medications to every visit  (01/08/2011)     Eating: eat more  vegetables, use fresh or frozen vegetables, eat foods that are low in salt, eat baked foods instead of fried foods, eat fruit for snacks and desserts  (01/08/2011)     Activity: take a 30 minute walk every day  (01/08/2011)    Lipid self-management support: Written self-care plan, Education handout, Resources for patients handout  (01/08/2011)   Lipid self-care plan printed.   Lipid education  handout printed      Resource handout printed.  Process Orders Check Orders Results:     [facility]: ABN not required for this insurance Tests Sent for requisitioning (January 08, 2011 9:01 PM):     01/08/2011: [facility] -- T-Lipid Profile [id number] (signed)     01/08/2011: [facility] -- T-Comprehensive Metabolic Panel [id number] (signed)     01/08/2011: [facility] -- T-CBC No Diff [[id number]] (signed)     Appended Document: LUMP IN BREAST AND TENDERNESS/SIDHU/DS Fasting lipid panel demonstrated significantly elevated total and LDL cholesterol (245 and 168, respectively). Called patient to discuss results. She is on a very low dose of pravastatin (10mg ). I recommended that she increase this dose; however, patient refused and insisted that she would rather continue current dose and try to lower cholesterol through lifestyle modifications. I recommend that she have her lipids checked again at follow-up and that, if they remain high, that she again consider increasing dose of pravastatin.   She requested that we mail a copy of her lab results to her address: [address] [place] [address]

## 2011-05-08 ENCOUNTER — Other Ambulatory Visit: Payer: Self-pay | Admitting: Internal Medicine

## 2011-06-01 ENCOUNTER — Encounter: Payer: Self-pay | Admitting: Internal Medicine

## 2011-06-18 ENCOUNTER — Other Ambulatory Visit: Payer: Self-pay | Admitting: Internal Medicine

## 2011-06-18 DIAGNOSIS — Z1231 Encounter for screening mammogram for malignant neoplasm of breast: Secondary | ICD-10-CM

## 2011-07-12 ENCOUNTER — Ambulatory Visit (INDEPENDENT_AMBULATORY_CARE_PROVIDER_SITE_OTHER): Payer: Self-pay | Admitting: Internal Medicine

## 2011-07-12 ENCOUNTER — Encounter: Payer: Self-pay | Admitting: Internal Medicine

## 2011-07-12 ENCOUNTER — Other Ambulatory Visit (HOSPITAL_COMMUNITY)
Admission: RE | Admit: 2011-07-12 | Discharge: 2011-07-12 | Disposition: A | Discharge status: Home / Self Care | Payer: Self-pay | Source: Ambulatory Visit | Attending: Internal Medicine | Admitting: Internal Medicine

## 2011-07-12 VITALS — BP 132/88 | HR 77 | Temp 97.9°F | Ht 64.0 in | Wt 256.7 lb

## 2011-07-12 DIAGNOSIS — Z0000 Encounter for general adult medical examination without abnormal findings: Secondary | ICD-10-CM

## 2011-07-12 DIAGNOSIS — Z01419 Encounter for gynecological examination (general) (routine) without abnormal findings: Secondary | ICD-10-CM | POA: Insufficient documentation

## 2011-07-12 DIAGNOSIS — E785 Hyperlipidemia, unspecified: Secondary | ICD-10-CM

## 2011-07-12 LAB — LIPID PANEL
Cholesterol: 230 mg/dL — ABNORMAL HIGH (ref 0–200)
HDL: 69 mg/dL (ref >39)
Triglycerides: 66 mg/dL (ref <150)

## 2011-07-12 MED ORDER — PRAVASTATIN SODIUM 10 MG PO TABS: 10.0000 mg | ORAL_TABLET | Freq: Every day | ORAL | Status: DC

## 2011-07-12 NOTE — Progress Notes (Signed)
  Subjective:    Patient ID: Tina Stone, female    DOB: 02-18-1950, 61 y.o.   MRN: [id number]  HPI  Tina Stone is a pleasant 61 year old woman with pmh significant for HLD, and GERD who presents to the clinic today for general follow-up.  1.) HLD - On pravastatin for several years. Denies side effects of muscle aches/pain related to medications. States she is working on her diet, and swims regularly for exercise.   2.) Preventative Care - mammogram scheduled for 08/30/2011, and would like PAP today.   Patient has no other complaints or concerns today. She denies chest pain, cough, sob, headache, N/V, changes in abdominal and urinary character.   Review of Systems  All other systems reviewed and are negative.       Objective:   Physical Exam  Constitutional: She is oriented to person, place, and time. She appears well-developed.  HENT:  Head: Normocephalic and atraumatic.  Eyes: Pupils are equal, round, and reactive to light.  Neck: Normal range of motion. Neck supple.  Cardiovascular: Normal rate and regular rhythm.   Pulmonary/Chest: Effort normal and breath sounds normal.  Abdominal: Soft. Bowel sounds are normal. She exhibits no distension and no mass.  Musculoskeletal: Normal range of motion.  Neurological: She is alert and oriented to person, place, and time.          Assessment & Plan:

## 2011-07-12 NOTE — Assessment & Plan Note (Signed)
Routine PAP performed today.  Colonoscopy will be scheduled today. Mammogram already scheduled for 08/30/2011

## 2011-07-12 NOTE — Patient Instructions (Signed)
Please follow up in 6 months to 1 year. Please continue current medications. Please follow up with colonoscopy and mammogram scheduled.

## 2011-07-12 NOTE — Progress Notes (Signed)
  Subjective:    Patient ID: Tina Stone, female    DOB: 1950/03/08, 61 y.o.   MRN: [id number]  HPI   Tina Stone is a pleasant 61 year old woman with pmh significant for HLD, and GERD who presents to the clinic today for general follow-up.  1.) HLD - On pravastatin for several years. Denies side effects of muscle aches/pain related to medications. States she is working on her diet, and swims regularly for exercise.   2.) Preventative Care - mammogram scheduled for 08/30/2011, and would like PAP today. Would also like colonoscopy in near future.   Patient has no other complaints or concerns today. She denies chest pain, cough, sob, headache, N/V, changes in abdominal and urinary character.   Review of Systems  All other systems reviewed and are negative.       Objective:   Physical Exam  Constitutional: She is oriented to person, place, and time. She appears well-developed.  HENT:  Head: Normocephalic and atraumatic.  Eyes: Pupils are equal, round, and reactive to light.  Neck: Normal range of motion. Neck supple.  Cardiovascular: Normal rate and regular rhythm.   Pulmonary/Chest: Effort normal and breath sounds normal.  Abdominal: Soft. Bowel sounds are normal. She exhibits no distension and no mass.  Genitourinary: Vagina normal and uterus normal. No vaginal discharge found.  Musculoskeletal: Normal range of motion.  Neurological: She is alert and oriented to person, place, and time.          Assessment & Plan:

## 2011-07-12 NOTE — Assessment & Plan Note (Signed)
Subjective:    Tina Stone is here for follow up of dyslipidemia.  Compliance with treatment has been good. The patient exercises daily. Patient denies muscle pain associated with her medications.  Lab Review Lab Results  Component Value Date   CHOL 245* 01/08/2011   CHOL 236* 09/14/2010   CHOL 219* 02/23/2010   TRIG 74 01/08/2011   TRIG 73 09/14/2010   TRIG 58 02/23/2010   HDL 62 01/08/2011   HDL 67 09/14/2010   HDL 61 1/61/0960      Assessment:    Dyslipidemia under good control.    Plan:    1. Continue dietary measures. 2. Continue regular exercise. 3. Lipid-lowering medications: Pravastatin at current dose.  4. Check CMP and FLP today since patient is fasting.

## 2011-07-13 LAB — COMPLETE METABOLIC PANEL WITH GFR
BUN: 11 mg/dL (ref 6–23)
CO2: 28 mEq/L (ref 19–32)
Calcium: 9.2 mg/dL (ref 8.4–10.5)
Chloride: 102 mEq/L (ref 96–112)
Creat: 0.86 mg/dL (ref 0.50–1.10)
GFR, Est African American: >60 mL/min (ref >60)
GFR, Est Non African American: >60 mL/min (ref >60)
Glucose, Bld: 87 mg/dL (ref 70–99)
Total Bilirubin: 0.7 mg/dL (ref 0.3–1.2)

## 2011-07-18 ENCOUNTER — Telehealth: Payer: Self-pay | Admitting: *Deleted

## 2011-07-18 NOTE — Telephone Encounter (Signed)
Pt requested lab results from 07/12/11; results given.

## 2011-08-02 ENCOUNTER — Ambulatory Visit: Payer: Self-pay

## 2011-08-30 ENCOUNTER — Ambulatory Visit
Admission: RE | Admit: 2011-08-30 | Discharge: 2011-08-30 | Disposition: A | Discharge status: Home / Self Care | Payer: Self-pay | Source: Ambulatory Visit | Attending: Internal Medicine | Admitting: Internal Medicine

## 2011-08-30 DIAGNOSIS — Z1231 Encounter for screening mammogram for malignant neoplasm of breast: Secondary | ICD-10-CM

## 2011-10-11 ENCOUNTER — Ambulatory Visit: Payer: Self-pay

## 2011-10-25 ENCOUNTER — Ambulatory Visit (INDEPENDENT_AMBULATORY_CARE_PROVIDER_SITE_OTHER): Payer: Self-pay | Admitting: *Deleted

## 2011-10-25 DIAGNOSIS — Z23 Encounter for immunization: Secondary | ICD-10-CM

## 2011-11-21 ENCOUNTER — Telehealth: Payer: Self-pay | Admitting: *Deleted

## 2011-11-21 NOTE — Telephone Encounter (Signed)
Needs to go to UC-cannot treat over the phone.

## 2011-11-21 NOTE — Telephone Encounter (Signed)
Returned pt's call. States she might have an UTI or Vaginal infection. Symptoms are:  Pain lower abd/"uterus" area, slight discharge w/sl odor x 1.5 weeks. No pain/burning upon urination.  States she has had these symptoms before.  Please advise.  Thanks

## 2011-11-21 NOTE — Telephone Encounter (Signed)
Pt was called and instructed to go to Urgent Care; she agreed.

## 2011-12-06 ENCOUNTER — Ambulatory Visit (INDEPENDENT_AMBULATORY_CARE_PROVIDER_SITE_OTHER): Payer: Self-pay | Admitting: Internal Medicine

## 2011-12-06 ENCOUNTER — Encounter: Payer: Self-pay | Admitting: Internal Medicine

## 2011-12-06 VITALS — BP 139/84 | HR 76 | Temp 97.2°F | Ht 64.0 in | Wt 263.3 lb

## 2011-12-06 DIAGNOSIS — N898 Other specified noninflammatory disorders of vagina: Secondary | ICD-10-CM

## 2011-12-06 NOTE — Progress Notes (Signed)
  Subjective:    Patient ID: Tina Stone, female    DOB: 12-16-1950, 61 y.o.   MRN: [id number]  HPI  Patient is 61 year old female with a past medical history listed below, presents to the outpatient clinic with complaints of vaginal discharge for the past 3 weeks. She describes the discharge as clear to brown no associated with any specific odor, denies any abdominal pain. Denies any recent sexual contacts, and states that she has not had sex since her husband died 9 years ago. Denies any fever or chills  Patient Active Problem List  Diagnoses  . FIBROIDS, UTERUS  . HYPERLIPIDEMIA  . OBESITY  . GERD  . CHOLELITHIASIS  . Pain in Joint, Lower Leg  . BACK PAIN, LUMBAR  . PELVIC  PAIN  . ULNAR NERVE ENTRAPMENT  . UPPER RESPIRATORY INFECTION  . Preventative health care   Current Outpatient Prescriptions on File Prior to Visit  Medication Sig Dispense Refill  . pravastatin (PRAVACHOL) 10 MG tablet Take 1 tablet (10 mg total) by mouth daily.  30 tablet  6   Allergies  Allergen Reactions  . Shellfish Allergy Nausea Only     Review of Systems  All other systems reviewed and are negative.       Objective:   Physical Exam  Vitals reviewed. Constitutional: She is oriented to person, place, and time. She appears well-developed and well-nourished.  HENT:  Head: Normocephalic and atraumatic.  Eyes: Pupils are equal, round, and reactive to light.  Neck: Normal range of motion. Neck supple. No JVD present. No thyromegaly present.  Cardiovascular: Normal rate, regular rhythm and normal heart sounds.   No murmur heard. Pulmonary/Chest: Effort normal and breath sounds normal. She has no wheezes. She has no rales.  Abdominal: Soft. Bowel sounds are normal.  Genitourinary: Vaginal discharge found.  Musculoskeletal: Normal range of motion. She exhibits no edema.  Neurological: She is alert and oriented to person, place, and time.  Skin: Skin is warm and dry.          Assessment  & Plan:

## 2011-12-06 NOTE — Assessment & Plan Note (Signed)
Patient has been having vaginal discharge last 3 weeks describes it as clear to brown, denies sexual intercourse since her husband died 9 years ago. Wet prep performed today, based on the results we'll manage further accordingly

## 2011-12-06 NOTE — Patient Instructions (Signed)
We will call you in about 2 days with the results of the wet prep.

## 2011-12-07 LAB — WET PREP BY MOLECULAR PROBE
Candida species: NEGATIVE
Trichomonas vaginosis: NEGATIVE

## 2012-05-28 ENCOUNTER — Encounter: Payer: Self-pay | Admitting: Internal Medicine

## 2012-05-28 ENCOUNTER — Encounter: Payer: Self-pay | Admitting: Gastroenterology

## 2012-05-28 ENCOUNTER — Ambulatory Visit (INDEPENDENT_AMBULATORY_CARE_PROVIDER_SITE_OTHER): Payer: Self-pay | Admitting: Internal Medicine

## 2012-05-28 VITALS — BP 127/82 | HR 74 | Temp 96.6°F | Ht 64.0 in | Wt 254.7 lb

## 2012-05-28 DIAGNOSIS — J302 Other seasonal allergic rhinitis: Secondary | ICD-10-CM | POA: Insufficient documentation

## 2012-05-28 DIAGNOSIS — E785 Hyperlipidemia, unspecified: Secondary | ICD-10-CM

## 2012-05-28 DIAGNOSIS — D259 Leiomyoma of uterus, unspecified: Secondary | ICD-10-CM

## 2012-05-28 DIAGNOSIS — Z0000 Encounter for general adult medical examination without abnormal findings: Secondary | ICD-10-CM

## 2012-05-28 DIAGNOSIS — Z79899 Other long term (current) drug therapy: Secondary | ICD-10-CM

## 2012-05-28 LAB — LIPID PANEL
LDL Cholesterol: 193 mg/dL — ABNORMAL HIGH (ref 0–99)
Triglycerides: 58 mg/dL (ref <150)
VLDL: 12 mg/dL (ref 0–40)

## 2012-05-28 LAB — POCT GLYCOSYLATED HEMOGLOBIN (HGB A1C): Hemoglobin A1C: 5.8

## 2012-05-28 LAB — COMPREHENSIVE METABOLIC PANEL
ALT: 10 U/L (ref 0–35)
AST: 14 U/L (ref 0–37)
CO2: 28 mEq/L (ref 19–32)
Calcium: 8.9 mg/dL (ref 8.4–10.5)
Chloride: 103 mEq/L (ref 96–112)
Creat: 0.92 mg/dL (ref 0.50–1.10)
Sodium: 140 mEq/L (ref 135–145)
Total Bilirubin: 0.5 mg/dL (ref 0.3–1.2)
Total Protein: 6.9 g/dL (ref 6.0–8.3)

## 2012-05-28 MED ORDER — FLUTICASONE PROPIONATE 50 MCG/ACT NA SUSP: 1.0000 | Freq: Every day | NASAL | Status: DC

## 2012-05-28 NOTE — Assessment & Plan Note (Signed)
Lab Results  Component Value Date   CHOL 230* 07/12/2011   HDL 69 07/12/2011   LDLCALC 148* 07/12/2011   TRIG 66 07/12/2011   CHOLHDL 3.3 07/12/2011   Lipids are under good control, at goal. Will check lipid profile today. Patient would also like for A1C to be checked given family history, and obesity.

## 2012-05-28 NOTE — Assessment & Plan Note (Signed)
Will schedule colonoscopy for patient today. Mammogram due in August, and PAP up to date.

## 2012-05-28 NOTE — Progress Notes (Signed)
Patient ID: Tina Stone, female   DOB: Feb 25, 1950, 62 y.o.   MRN: [id number]  Subjective:    Patient ID: Tina Stone, female    DOB: 17-Sep-1950, 62 y.o.   MRN: [id number]  HPI  Tina Stone is a pleasant 62 year old woman with pmh significant for HLD, and GERD who presents to the clinic today for general follow-up.  1.) HLD - On pravastatin for several years. Denies side effects of muscle aches/pain related to medications. States she is working on her diet, and swims regularly for exercise. Has also started taking herbal supplement with apple cider and vinegar that is supposed to help reduce cholesterol.   2.) Preventative Care - mammogram due in August, and PAP done in 12/2011. Would also like colonoscopy scheduled.  Patient has no other complaints or concerns today. She denies chest pain, cough, sob, headache, N/V, changes in abdominal and urinary character.   Review of Systems  All other systems reviewed and are negative.       Objective:   Physical Exam  Constitutional: She is oriented to person, place, and time. She appears well-developed.  HENT:  Head: Normocephalic and atraumatic.  Mouth/Throat: No oropharyngeal exudate.       Nasal turbinates inflamed  Eyes: Pupils are equal, round, and reactive to light.  Neck: Normal range of motion. Neck supple.  Cardiovascular: Normal rate and regular rhythm.   Pulmonary/Chest: Effort normal and breath sounds normal.  Abdominal: Soft. Bowel sounds are normal. She exhibits no distension and no mass.  Genitourinary: Vagina normal and uterus normal. No vaginal discharge found.  Musculoskeletal: Normal range of motion.  Neurological: She is alert and oriented to person, place, and time.          Assessment & Plan:

## 2012-05-28 NOTE — Patient Instructions (Signed)
Please follow up in 1 year, or as needed.

## 2012-05-28 NOTE — Assessment & Plan Note (Signed)
Stable, no evidence of uterine bleeding.

## 2012-06-30 NOTE — Addendum Note (Signed)
Addended by: Neomia Dear on: 06/30/2012 04:09 PM   Modules accepted: Orders

## 2012-07-10 ENCOUNTER — Encounter: Payer: Self-pay | Admitting: Gastroenterology

## 2012-07-22 ENCOUNTER — Other Ambulatory Visit: Payer: Self-pay | Admitting: *Deleted

## 2012-07-22 DIAGNOSIS — E785 Hyperlipidemia, unspecified: Secondary | ICD-10-CM

## 2012-07-23 MED ORDER — PRAVASTATIN SODIUM 10 MG PO TABS: 10.0000 mg | ORAL_TABLET | Freq: Every day | ORAL | Status: DC

## 2012-08-08 ENCOUNTER — Other Ambulatory Visit: Payer: Self-pay | Admitting: Internal Medicine

## 2012-08-08 DIAGNOSIS — Z1231 Encounter for screening mammogram for malignant neoplasm of breast: Secondary | ICD-10-CM

## 2012-09-03 ENCOUNTER — Ambulatory Visit
Admission: RE | Admit: 2012-09-03 | Discharge: 2012-09-03 | Disposition: A | Discharge status: Home / Self Care | Payer: Self-pay | Source: Ambulatory Visit | Attending: Internal Medicine | Admitting: Internal Medicine

## 2012-09-03 ENCOUNTER — Ambulatory Visit: Payer: Self-pay

## 2012-09-03 DIAGNOSIS — Z1231 Encounter for screening mammogram for malignant neoplasm of breast: Secondary | ICD-10-CM

## 2012-11-19 ENCOUNTER — Ambulatory Visit (INDEPENDENT_AMBULATORY_CARE_PROVIDER_SITE_OTHER): Payer: Self-pay | Admitting: Internal Medicine

## 2012-11-19 ENCOUNTER — Encounter: Payer: Self-pay | Admitting: Internal Medicine

## 2012-11-19 VITALS — BP 137/85 | HR 74 | Temp 97.2°F | Ht 64.0 in | Wt 257.9 lb

## 2012-11-19 DIAGNOSIS — Z0000 Encounter for general adult medical examination without abnormal findings: Secondary | ICD-10-CM

## 2012-11-19 DIAGNOSIS — L989 Disorder of the skin and subcutaneous tissue, unspecified: Secondary | ICD-10-CM | POA: Insufficient documentation

## 2012-11-19 DIAGNOSIS — E785 Hyperlipidemia, unspecified: Secondary | ICD-10-CM

## 2012-11-19 DIAGNOSIS — IMO0001 Reserved for inherently not codable concepts without codable children: Secondary | ICD-10-CM

## 2012-11-19 DIAGNOSIS — E669 Obesity, unspecified: Secondary | ICD-10-CM

## 2012-11-19 HISTORY — DX: Reserved for inherently not codable concepts without codable children: IMO0001

## 2012-11-19 NOTE — Assessment & Plan Note (Signed)
Patient has a skin bruise on right forearm as described in history of present illness and physical exam. Old bruise.  Patient reports having about 2 skin lesions like this every year for past 3 years or so. No other bleeding or history of prolonged bleeding. No family history of prolonged bleeding. Discussed with her about benign etiology and possible association with unwitnessed mild trauma. Discussed to watch out for new lesions and come to clinic to check it if she has more or has any other bleeding from other sites. Will need to get PT/INR and further workup in that case if needed. She verbalized understanding.

## 2012-11-19 NOTE — Assessment & Plan Note (Addendum)
Patient trying to lose weight with herbal medication. Tina Stone.  Reviewed it on line on Web M.D. with patient. There is no mention of significant side effects except nausea and GI problems. It seems to be safe up to 12 weeks. Asked patient to make her own choice and discussed with her that I would not recommend taking or continuing this herbal medication- but is up to her. Also I cannot say that there will be no interaction with her other medications as we don't have any data.

## 2012-11-19 NOTE — Assessment & Plan Note (Signed)
Patient still has not done colonoscopy. She said she will make an appointment.

## 2012-11-19 NOTE — Progress Notes (Signed)
  Subjective:    Patient ID: Tina Stone, female    DOB: April 02, 1950, 62 y.o.   MRN: [id number]  HPI patient is a pleasant 62 year old woman with hyperlipidemia, fibroid uterus and other medical problems as per problem list who comes the clinic for skin bruising. She has right forearm skin breaks since Sunday. She is unclear if she hit her forearm anywhere. She says it started with blue color and now it's brown. Does not have any itching, pain. She reports having This kind of bruising about 2 times a year and mostly in an and around knees. Does not remember if it's associated trauma, but she says it might be. She does not have history of epistaxis, GI bleeding or prolonged bleeding. Also does not have any family history of prolonged bleeding.  She denies any fever, chills, nausea vomiting or abdominal pain, chest pain, short of breath, diarrhea, palpitations.  She also was wondering if her new herbal medication for weight loss- Felipa Furnace- would have an interaction with her current medications.   Review of Systems    as per history of present illness. Objective:   Physical Exam  General: NAD. HEENT: PERRL, EOMI, no scleral icterus Cardiac: S1, S2, RRR, no rubs, murmurs or gallops Pulm: clear to auscultation bilaterally, moving normal volumes of air Abd: soft, nontender, nondistended, BS present Ext: warm and well perfused, no pedal edema Skin: Right mid forearm 2 x 2 centimeter old Bruise- brown color. No other skin lesions seen on leg, knees, back. No tenderness, itching. Neuro: alert and oriented X3, cranial nerves II-XII grossly intact       Assessment & Plan:

## 2012-11-19 NOTE — Patient Instructions (Signed)
Followup with your primary Dr. In January 2014. Your bruises seems to be normal. If you have multiple bruises or you start bleeding from somewhere else- make an early appointment. Continue taking all medications regularly.

## 2012-11-19 NOTE — Assessment & Plan Note (Signed)
-  Continue Pravachol ?

## 2012-12-31 HISTORY — PX: BREAST EXCISIONAL BIOPSY: SUR124

## 2013-01-06 ENCOUNTER — Encounter: Payer: Self-pay | Admitting: Internal Medicine

## 2013-01-12 ENCOUNTER — Telehealth: Payer: Self-pay | Admitting: Internal Medicine

## 2013-01-12 NOTE — Telephone Encounter (Signed)
Rec'd phone call from the patient stating that she wishes not to keep her appointment with you on 01/13/2013 as she now has insurance.  Patient stated that she now has the Affordable Care Act "[employer]" insurance.  She stated that she understood that she will be changing providers and we are no longer her PCP as of this notice from her.  Encouraged patient keep the [facility] as her provider until she knows who her new Dr. Stann Mainland be.  She refused.  Will remove your name as PCP per the patient.

## 2013-01-13 ENCOUNTER — Encounter: Payer: Self-pay | Admitting: Internal Medicine

## 2013-03-10 ENCOUNTER — Encounter: Payer: Self-pay | Admitting: Internal Medicine

## 2013-03-10 ENCOUNTER — Encounter: Payer: Self-pay | Admitting: Gastroenterology

## 2013-03-10 ENCOUNTER — Ambulatory Visit (INDEPENDENT_AMBULATORY_CARE_PROVIDER_SITE_OTHER): Payer: Self-pay | Admitting: Internal Medicine

## 2013-03-10 VITALS — BP 130/78 | HR 91 | Temp 97.2°F | Ht 64.0 in | Wt 258.4 lb

## 2013-03-10 DIAGNOSIS — E785 Hyperlipidemia, unspecified: Secondary | ICD-10-CM

## 2013-03-10 DIAGNOSIS — Z0000 Encounter for general adult medical examination without abnormal findings: Secondary | ICD-10-CM

## 2013-03-10 DIAGNOSIS — E669 Obesity, unspecified: Secondary | ICD-10-CM

## 2013-03-10 LAB — LIPID PANEL
HDL: 62 mg/dL (ref >39)
LDL Cholesterol: 168 mg/dL — ABNORMAL HIGH (ref 0–99)
Triglycerides: 75 mg/dL (ref <150)
VLDL: 15 mg/dL (ref 0–40)

## 2013-03-10 NOTE — Assessment & Plan Note (Signed)
Referral made to GI today for screening colonoscopy. Pt in agreement. Pap, MMG up to date.

## 2013-03-10 NOTE — Assessment & Plan Note (Signed)
Patient continues to be frustrated with weight loss attempts. Aware of poor habits (including nighttime snacking, unhealthy foods). Discussed importance of diet and exercise. Offered referral to [facility] for further discussion of lifestyle modifications and interventions to assist with weight loss. Patient refused at this time.  Patient was counseled extensively on healthy eating/exercise habits. Pamphlets on diet/exercise provided. Encouraged to call clinic if changes mind and interested in meeting w Lupita Leash Plyler or to follow up on weight loss strategies.

## 2013-03-10 NOTE — Assessment & Plan Note (Addendum)
Lab Results  Component Value Date   CHOL 266* 05/28/2012   HDL 61 05/28/2012   LDLCALC 193* 05/28/2012   TRIG 58 05/28/2012   CHOLHDL 4.4 05/28/2012    Patient on pravachol 10mg  daily. Will check lipid panel today. Patient was educated that she may need uptitration of therapy or change to more potent statin pending results. Framingham CV risk is low (3%).  ADDENDUM Cholesterol panel returned: Lab Results  Component Value Date   CHOL 245* 03/10/2013   HDL 62 03/10/2013   LDLCALC 168* 03/10/2013   TRIG 75 03/10/2013   CHOLHDL 4.0 03/10/2013   Discussed results with patient. Instructed patient to increase pravastatin dose from 10 to 20 mg daily. Sent to pharmacy. Pt in agreement.

## 2013-03-10 NOTE — Progress Notes (Signed)
Patient ID: Tina Stone, female   DOB: Jun 04, 1950, 63 y.o.   MRN: [id number]  Subjective:   Patient ID: Tina Stone female   DOB: 21-Oct-1950 63 y.o.   MRN: [id number]  HPI: [name] is a 63 y.o. female w pmh of HLD and GERD presenting today for routine follow-up. She has no complaints today.  Continues to take pravachol 10mg  daily for hyperlipidemia. Is not on any therapy for GERD currently. She reports she sometimes feels bloated or gaseous after meals but that these symptoms only occur 1-2 days a month. Denies any heartburn pain, dysphagia, abdominal pain, diarrhea, constipation, hematochezia or melena.    She reports some frustration with difficulty achieving weight loss, but acknowledges that often snacks nightly and doesn't eat foods that she knows are healthy for her. Says that she gets exercise with work as Secretary/administrator, but no exercise outside of work. Weight unchanged from last visit. Is not ready to meet with nutritionist at this time.  In terms of age-appropriate screening, she is up to date with mammography and Pap smear. She has not yet received screening for colon cancer and wishes to schedule screening today.   Past Medical History  Diagnosis Date  . HLD (hyperlipidemia)   . GERD (gastroesophageal reflux disease)   . Cholelithiasis    Current Outpatient Prescriptions  Medication Sig Dispense Refill  . fluticasone (FLONASE) 50 MCG/ACT nasal spray Place 1 spray into the nose daily.  16 g  2  . pravastatin (PRAVACHOL) 10 MG tablet Take 1 tablet (10 mg total) by mouth daily.  90 tablet  3   No current facility-administered medications for this visit.   Family History  Problem Relation Age of Onset  . Diabetes Mother   . Diabetes Father    History   Social History  . Marital Status: Widowed    Spouse Name: N/A    Number of Children: N/A  . Years of Education: N/A   Social History Main Topics  . Smoking status: Never Smoker   . Smokeless tobacco: None  .  Alcohol Use: No  . Drug Use: No  . Sexually Active: None   Other Topics Concern  . 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  Patient is an overweight female in no acute distress and cooperative with exam. Alert and oriented x3.  Head: Normocephalic and atraumatic Mouth: no erythema or exudates, MMM Eyes: PERRL, EOMI, conjunctivae normal, No scleral icterus.  Neck: Supple, Trachea midline normal ROM, No JVD, mass, thyromegaly Cardiovascular: RRR, S1 normal, S2 normal, no MRG, pulses symmetric and intact bilaterally Pulmonary/Chest: CTAB, no wheezes, rales, or rhonchi Abdominal: Soft. Non-tender, non-distended, bowel sounds are normal, no masses, organomegaly, or guarding present.  GU: no CVA tenderness Musculoskeletal: No joint deformities, erythema, or stiffness, ROM full and no nontender Hematology: no cervical LAD Neurological: A&O x3, Strength is normal and symmetric bilaterally, cranial nerve II-XII are grossly intact, no focal motor deficit, sensory intact to light touch bilaterally.  Skin: Warm, dry and  intact. No rash, cyanosis, or clubbing.  Psychiatric: Normal mood and affect. speech and behavior is normal. Judgment and thought content normal. Cognition and memory are normal.   Assessment & Plan:

## 2013-03-10 NOTE — Patient Instructions (Addendum)
1. You will be called to schedule an appointment for a screening colonoscopy. 2. Keep taking your pravastatin. 3. Keep up your efforts at diet change and weight loss. Please call us if you would like to schedule an appointment with our nutritionist to further discuss  strategies for weight loss.   It was a pleasure to meet you. Come back for a routine exam in a year.

## 2013-03-11 MED ORDER — PRAVASTATIN SODIUM 10 MG PO TABS: 20.0000 mg | ORAL_TABLET | Freq: Every day | ORAL | Status: DC

## 2013-03-11 NOTE — Addendum Note (Signed)
Addended by: Ignacia Palma on: 03/11/2013 11:57 AM   Modules accepted: Orders

## 2013-03-30 ENCOUNTER — Encounter: Payer: Self-pay | Admitting: Gastroenterology

## 2013-03-30 ENCOUNTER — Ambulatory Visit (AMBULATORY_SURGERY_CENTER): Payer: BC Managed Care – PPO | Admitting: *Deleted

## 2013-03-30 VITALS — Ht 64.0 in | Wt 258.0 lb

## 2013-03-30 DIAGNOSIS — Z1211 Encounter for screening for malignant neoplasm of colon: Secondary | ICD-10-CM

## 2013-03-30 MED ORDER — NA SULFATE-K SULFATE-MG SULF 17.5-3.13-1.6 GM/177ML PO SOLN: ORAL | Status: DC

## 2013-04-14 ENCOUNTER — Encounter: Payer: Self-pay | Admitting: Gastroenterology

## 2013-06-02 ENCOUNTER — Telehealth: Payer: Self-pay | Admitting: Gastroenterology

## 2013-06-02 NOTE — Telephone Encounter (Signed)
Phone call returned to patient. Will leave free prep for patient at the 4th floor check in for pickup. Pt will be by today or tomorrow to pick up.

## 2013-06-09 ENCOUNTER — Encounter: Payer: Self-pay | Admitting: Gastroenterology

## 2013-07-09 ENCOUNTER — Encounter: Payer: Self-pay | Admitting: Gastroenterology

## 2013-07-28 ENCOUNTER — Encounter: Payer: Self-pay | Admitting: Gastroenterology

## 2013-08-04 ENCOUNTER — Encounter: Payer: Self-pay | Admitting: Internal Medicine

## 2013-08-04 ENCOUNTER — Other Ambulatory Visit: Payer: Self-pay | Admitting: Internal Medicine

## 2013-08-04 ENCOUNTER — Ambulatory Visit (INDEPENDENT_AMBULATORY_CARE_PROVIDER_SITE_OTHER): Payer: BC Managed Care – PPO | Admitting: Internal Medicine

## 2013-08-04 VITALS — BP 129/79 | HR 66 | Temp 97.1°F | Ht 64.0 in | Wt 257.6 lb

## 2013-08-04 DIAGNOSIS — Z0000 Encounter for general adult medical examination without abnormal findings: Secondary | ICD-10-CM

## 2013-08-04 DIAGNOSIS — E669 Obesity, unspecified: Secondary | ICD-10-CM

## 2013-08-04 DIAGNOSIS — H6120 Impacted cerumen, unspecified ear: Secondary | ICD-10-CM

## 2013-08-04 DIAGNOSIS — N63 Unspecified lump in unspecified breast: Secondary | ICD-10-CM

## 2013-08-04 DIAGNOSIS — E785 Hyperlipidemia, unspecified: Secondary | ICD-10-CM

## 2013-08-04 DIAGNOSIS — H6121 Impacted cerumen, right ear: Secondary | ICD-10-CM

## 2013-08-04 DIAGNOSIS — N6320 Unspecified lump in the left breast, unspecified quadrant: Secondary | ICD-10-CM

## 2013-08-04 DIAGNOSIS — Z23 Encounter for immunization: Secondary | ICD-10-CM

## 2013-08-04 LAB — CBC
HCT: 37.2 % (ref 36.0–46.0)
MCHC: 32.8 g/dL (ref 30.0–36.0)
Platelets: 239 10*3/uL (ref 150–400)
RDW: 17.5 % — ABNORMAL HIGH (ref 11.5–15.5)
WBC: 4.5 10*3/uL (ref 4.0–10.5)

## 2013-08-04 MED ORDER — ZOSTER VACCINE LIVE 19400 UNT/0.65ML ~~LOC~~ SOLR: 0.6500 mL | Freq: Once | SUBCUTANEOUS | Status: DC

## 2013-08-04 NOTE — Assessment & Plan Note (Signed)
Assessment:  Patient found lump in her left breast a couple of months ago. She has not had any pain, discharge, erythema, or skin changes. She had her last screening mammogram in 9/13 which was normal.  Her mammogram from 7/11 notes a heterogeneous hypoechoic mass with lobulated margins at the 7 o'clock position 3 cm from the left nipple (same location as the current lump), considered benign.  On exam today, a marble-sized lump inferior to 7 o'clock position of left breast was appreciated, no skin changes.   Plan:Will set up diagnostic left breast, screening right breast mammograms today.

## 2013-08-04 NOTE — Assessment & Plan Note (Addendum)
Patient given Tdap in clinic today, given prescription for Zostavax to take to her pharmacy and have administered there.  Patient will get flu shot there next month as well.  Patient has colonoscopy scheduled for 9/28, states she has had to reschedule several times due to needing someone to drive her home after the procedure.  We will get her left diagnostic, right screening mammograms set up today.

## 2013-08-04 NOTE — Assessment & Plan Note (Signed)
Assessment: Patient BMI is 44.3 today, unchanged from 3/14. She continues to be frustrated with her weight loss attempts and was asking about a diet pill that she could take. We discussed healthy eating at length today, including a sample daily diet, and the importance of regular exercise. Patient is already exercising regularly, mainly by swimming. We discussed orlistat as a pharmacologic therapy but that this medication is not appropriate given that she is on pravastatin.  She is not interested in meeting with Norm Parcel for discussion of healthy lifestyle choices.  Plan: Patient will continue to work on her eating habits and exercise. She was encouraged to call if she would like to meet with the dietitian but if not, we will reevaluate at her checkup in approximately one year.

## 2013-08-04 NOTE — Progress Notes (Signed)
Patient ID: Tina Stone, female   DOB: [birth year], 63 y.o.   MRN: [id number]   Subjective:   Patient ID: Tina Stone female   DOB: [birth year] 63 y.o.   MRN: [id number]  HPI: [name] is a 63 y.o. woman with past medical history of hyperlipidemia, GERD, obesity who presents for her annual checkup. Patient's only complaint is that she is concerned about a lump she found in her left breast during self breast exam a couple of months ago. She has not noticed any pain, discharge, erythema, or skin changes; pt is post-menopausal. She had her last screening mammogram in 9/13 which was normal. She notes that she had a biopsy several years ago and states that a marker was placed in the location of the current lump. Her mammogram from 7/11 notes a heterogeneous hypoechoic mass with lobulated margins at the 7 o'clock position 3 cm from the left nipple (same location as the current lump).  Patient also endorses occasional right ear pain.  She states that she is swimming regularly and notices that it is worse after swimming.  The pain only lasts several seconds and then goes away and does not happen often.  She has not had any ear pain or drainage.   Patient states she is trying hard to lose weight and is asking if there is a diet pill she could take. She is not interested in meeting with the dietitian as she feels that she is already aware of healthy food choices. She is also exercising by swimming regularly.  Patient is also very on top of her health maintenance. She has a colonoscopy scheduled for 09/27/13 and she is interested in having "lab work" today. She is concerned about her A1C and TSH given her family history of diabetes and hypothyroidism.  She would also like to get T.dap and Zostavax today and wants to schedule her annual mammogram.   Past Medical History  Diagnosis Date  . HLD (hyperlipidemia)   . GERD (gastroesophageal reflux disease)   . Cholelithiasis   . Allergy    Current  Outpatient Prescriptions  Medication Sig Dispense Refill  . pravastatin (PRAVACHOL) 10 MG tablet Take 2 tablets (20 mg total) by mouth daily.  90 tablet  3  . Red Yeast Rice Extract (RED YEAST RICE PO) Take 2 tablets by mouth daily.       No current facility-administered medications for this visit.   Family History  Problem Relation Age of Onset  . Diabetes Mother   . Hypertension Mother   . Colon polyps Mother 34  . Diabetes Father   . Pancreatic cancer Maternal Aunt   . Colon cancer Neg Hx    History   Social History  . Marital Status: Widowed    Spouse Name: N/A    Number of Children: N/A  . Years of Education: N/A   Social History Main Topics  . Smoking status: Never Smoker   . Smokeless tobacco: Never Used  . Alcohol Use: No  . Drug Use: No  . Sexually Active: None   Other Topics Concern  . 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  HENT: Positive for ear pain. Negative for hearing loss, congestion, sore throat, neck pain and ear discharge.   Eyes: Negative for blurred vision.  Respiratory: Negative for cough and shortness of breath.   Cardiovascular: Negative for chest pain and palpitations.  Gastrointestinal: Negative for heartburn, nausea, vomiting, abdominal pain, diarrhea and constipation.  Genitourinary: Negative for dysuria, urgency and frequency.  Musculoskeletal: Negative for joint pain.  Skin: Negative for rash.  Neurological: Negative for dizziness, tingling, weakness and headaches.  Endo/Heme/Allergies: Negative for polydipsia.    Objective:  Physical Exam: Filed Vitals:   08/04/13 0829  BP: 129/79  Pulse: 66  Temp: 97.1 F (36.2 C)  TempSrc: Oral  Height: 5\' 4"  (1.626 m)  Weight: 257 lb 9.6 oz (116.847 kg)  SpO2: 100%   General:very pleasant, alert,  cooperative, and in no apparent distress HEENT: vision grossly intact, oropharynx clear and non-erythematous; right ear with large amount of cerumen obstructing view of TM Neck: supple, no lymphadenopathy, JVD, or carotid bruits Lungs: clear to ascultation bilaterally, normal work of respiration, no wheezes, rales, ronchi Heart: regular rate and rhythm, no murmurs, gallops, or rubs Breast: exam of left breast with marble-sized rubbery mass inferior to the 7 o'clock position of the left nipple, no retraction or other skin changes; exam of the right breast was unremarkable Abdomen: soft, non-tender, non-distended, normal bowel sounds Extremities: 2+ DP/PT pulses bilaterally, no cyanosis, clubbing, or edema Neurologic: alert & oriented X3, cranial nerves II-XII intact, strength grossly intact, sensation intact to light touch  Assessment & Plan:  Patient discussed with Dr. Criselda Peaches.  Please see problem-based assessment and plan.

## 2013-08-04 NOTE — Assessment & Plan Note (Addendum)
Assessment: Patient is complaining of intermittent right ear pain, mainly after swimming. No erythema, drainage, or tenderness to touch. On exam, there was a large amount of cerumen in the right ear canal which is likely blocking drainage of water out of the ear after swimming, leading to her pain.  Plan: Patient had ear wax removed by the nurse in clinic today. She states that her ear felt much better afterwards and will call if she has continued pain.

## 2013-08-04 NOTE — Assessment & Plan Note (Signed)
Assessment: Patient on pravastatin 20 mg daily and tolerating well. Dosage was increased from 10 mg to 20 mg at last visit in 3/14. Patient would like to have routine lab work done today. She is specifically requesting to have her A1c and TSH checked; however she has had two normal A1Cs since 2012 and two normal TSHs since 2008 thus we will not check these today.  This reasoning was explained to the patient who voiced understanding.  We will provide her with symptoms of hypothyroidism in her AVS, and she will let us know before her next visit if she experiences any of these.   Plan: -CBC, CMP today  -FLP at next visit

## 2013-08-04 NOTE — Patient Instructions (Addendum)
Follow-up with Dr. Aundria Rud in about a year.  Keep up the good work! We are getting your mammogram set up, and you will get a call with the appointment time.  Be sure to go to your colonoscopy in September. We are giving you a prescription for Zostavax today, go to your pharmacy and have it administered whenever is convenient for you. Please also get your flu shot when they arrive (usually by September), you can go to your pharmacy or call and make a nurse appointment to have it done here if you would like.  Symptoms of low thyroid are below, feel free to call if you experience any of these before your next appoitment.   Calorie Counting Diet A calorie counting diet requires you to eat the number of calories that are right for you in a day. Calories are the measurement of how much energy you get from the food you eat. Eating the right amount of calories is important for staying at a healthy weight. If you eat too many calories, your body will store them as fat and you may gain weight. If you eat too few calories, you may lose weight. Counting the number of calories you eat during a day will help you know if you are eating the right amount. A Registered Dietitian can determine how many calories you need in a day. The amount of calories needed varies from person to person. If your goal is to lose weight, you will need to eat fewer calories. Losing weight can benefit you if you are overweight or have health problems such as heart disease, high blood pressure, or diabetes. If your goal is to gain weight, you will need to eat more calories. Gaining weight may be necessary if you have a certain health problem that causes your body to need more energy. TIPS Whether you are increasing or decreasing the number of calories you eat during a day, it may be hard to get used to changes in what you eat and drink. The following are tips to help you keep track of the number of calories you eat.  Measure foods at home with  measuring cups. This helps you know the amount of food and number of calories you are eating.  Restaurants often serve food in amounts that are larger than 1 serving. While eating out, estimate how many servings of a food you are given. For example, a serving of cooked rice is  cup or about the size of half of a fist. Knowing serving sizes will help you be aware of how much food you are eating at restaurants.  Ask for smaller portion sizes or child-size portions at restaurants.  Plan to eat half of a meal at a restaurant. Take the rest home or share the other half with a friend.  Read the Nutrition Facts panel on food labels for calorie content and serving size. You can find out how many servings are in a package, the size of a serving, and the number of calories each serving has.  For example, a package might contain 3 cookies. The Nutrition Facts panel on that package says that 1 serving is 1 cookie. Below that, it will say there are 3 servings in the container. The calories section of the Nutrition Facts label says there are 90 calories. This means there are 90 calories in 1 cookie (1 serving). If you eat 1 cookie you have eaten 90 calories. If you eat all 3 cookies, you have eaten 270 calories (  3 servings x 90 calories = 270 calories). The list below tells you how big or small some common portion sizes are.  1 oz.........4 stacked dice.  3 oz........Marland KitchenDeck of cards.  1 tsp.......Marland KitchenTip of little finger.  1 tbs......Marland KitchenMarland KitchenThumb.  2 tbs.......Marland KitchenGolf ball.   cup......Marland KitchenHalf of a fist.  1 cup.......Marland KitchenA fist. KEEP A FOOD LOG Write down every food item you eat, the amount you eat, and the number of calories in each food you eat during the day. At the end of the day, you can add up the total number of calories you have eaten. It may help to keep a list like the one below. Find out the calorie information by reading the Nutrition Facts panel on food labels. Breakfast  Bran cereal (1 cup, 110  calories).  Fat-free milk ( cup, 45 calories). Snack  Apple (1 medium, 80 calories). Lunch  Spinach (1 cup, 20 calories).  Tomato ( medium, 20 calories).  Chicken breast strips (3 oz, 165 calories).  Shredded cheddar cheese ( cup, 110 calories).  Light [place] dressing (2 tbs, 60 calories).  Whole-wheat bread (1 slice, 80 calories).  Tub margarine (1 tsp, 35 calories).  Vegetable soup (1 cup, 160 calories). Dinner  Pork chop (3 oz, 190 calories).  Brown rice (1 cup, 215 calories).  Steamed broccoli ( cup, 20 calories).  Strawberries (1  cup, 65 calories).  Whipped cream (1 tbs, 50 calories). Daily Calorie Total: 1425 Document Released: 12/17/2005 Document Revised: 03/10/2012 Document Reviewed: 06/13/2007 [facility] Patient Information 2014 [place], [place].  Hypothyroidism Hypothyroidism is a condition due to under activity of the thyroid gland. CAUSES  Hypothyroidism may be due to thyroid surgery or disease.  SYMPTOMS  Symptoms are often vague. They may include:  Fatigue.  Mental dullness.  Weakness.  Hoarseness.  Constipation.  Swelling in the face, hands or feet.  Dry skin.  Hair loss.  Sensitivity to cold.  Menstrual problems.  Patients with hypothyroidism are also more likely to have problems with infections. DIAGNOSIS  The diagnosis is confirmed by lab tests for levels of thyroid hormones (TSH, T3, and T4). These tests are also used to monitor treatment. TREATMENT  Treatment includes the gradual replacement of thyroid hormones. It is very important that you take your thyroid medicine as prescribed daily, even after you begin to feel better. You will probably require thyroid medicine for the rest of your life. To reduce constipation, eat a diet high in fruits and vegetables. Try to get some exercise every day. Please see your doctor for follow up care as recommended so your treatment can be adjusted as your condition improves.  SEEK  IMMEDIATE MEDICAL CARE IF:  You have chest pain, palpitations, shortness of breath, or any other serious symptoms. Document Released: 01/24/2005 Document Revised: 03/10/2012 Document Reviewed: 12/17/2005 [facility] Patient Information 2014 [place], [place].

## 2013-08-05 LAB — COMPLETE METABOLIC PANEL WITH GFR
ALT: 13 U/L (ref 0–35)
AST: 18 U/L (ref 0–37)
Albumin: 3.8 g/dL (ref 3.5–5.2)
Alkaline Phosphatase: 89 U/L (ref 39–117)
Calcium: 9 mg/dL (ref 8.4–10.5)
Chloride: 102 mEq/L (ref 96–112)
Potassium: 4 mEq/L (ref 3.5–5.3)
Sodium: 138 mEq/L (ref 135–145)
Total Protein: 6.6 g/dL (ref 6.0–8.3)

## 2013-08-05 NOTE — Progress Notes (Signed)
I saw and evaluated the patient.  I personally confirmed the key portions of the history and exam documented by Dr. Aundria Rud and I reviewed pertinent patient test results.  The assessment, diagnosis, and plan were formulated together and I agree with the documentation in the resident's note.  There is no direct contraindication to Orlistat therapy and taking a statin, though Orlistat will likely not give the results she desires; she can attempt the lower dose of Alli if desired (OTC), however, continued focus on daily physical activity and dietary changes is the most appropriate option for weight loss.

## 2013-08-12 ENCOUNTER — Telehealth: Payer: Self-pay | Admitting: *Deleted

## 2013-08-12 NOTE — Telephone Encounter (Signed)
Discuss CBC and CMP results with pt per Dr Judie Petit. Tina Stone - pleased with results. Stanton Kidney Magalie Almon RN 08/12/13 11AM

## 2013-08-17 ENCOUNTER — Ambulatory Visit
Admission: RE | Admit: 2013-08-17 | Discharge: 2013-08-17 | Disposition: A | Discharge status: Home / Self Care | Payer: BC Managed Care – PPO | Source: Ambulatory Visit | Attending: Internal Medicine | Admitting: Internal Medicine

## 2013-08-17 DIAGNOSIS — N6320 Unspecified lump in the left breast, unspecified quadrant: Secondary | ICD-10-CM

## 2013-08-19 ENCOUNTER — Telehealth: Payer: Self-pay | Admitting: Internal Medicine

## 2013-08-19 NOTE — Telephone Encounter (Signed)
A user error has taken place: encounter opened in error, closed for administrative reasons.

## 2013-09-01 ENCOUNTER — Encounter (INDEPENDENT_AMBULATORY_CARE_PROVIDER_SITE_OTHER): Payer: Self-pay | Admitting: Surgery

## 2013-09-01 ENCOUNTER — Ambulatory Visit (INDEPENDENT_AMBULATORY_CARE_PROVIDER_SITE_OTHER): Payer: BC Managed Care – PPO | Admitting: Surgery

## 2013-09-01 ENCOUNTER — Telehealth (INDEPENDENT_AMBULATORY_CARE_PROVIDER_SITE_OTHER): Payer: Self-pay | Admitting: Surgery

## 2013-09-01 VITALS — BP 142/86 | HR 80 | Temp 97.9°F | Resp 16 | Ht 64.0 in | Wt 256.2 lb

## 2013-09-01 DIAGNOSIS — N6320 Unspecified lump in the left breast, unspecified quadrant: Secondary | ICD-10-CM

## 2013-09-01 DIAGNOSIS — N63 Unspecified lump in unspecified breast: Secondary | ICD-10-CM

## 2013-09-01 NOTE — Telephone Encounter (Signed)
Patient met with surgery scheduling, went over financial responsibility, placed orders in pending, face sheet placed in pending.

## 2013-09-01 NOTE — Progress Notes (Signed)
Patient ID: Tina Stone, female   DOB: 05/24/1950, 62 y.o.   MRN: [id number]  Chief Complaint  Patient presents with  . New Evaluation    eval lt br papilloma    HPI Tina Stone is a 62 y.o. female.  Referred by Dr. Morgan for evaluation of enlarging left breast mass HPI This is a 62-year-old female in good health who presents with a 4 year history of a palpable mass in her lower inner quadrant of her left breast. This was biopsied in 2010 and was shown to be A. Intraductal papilloma with usual ductal hyperplasia. The decision was made that time not to resect this area. It has enlarged slightly as confirmed by mammogram and ultrasound.  It now measures 1.5 x 1.3 x 1.2 cm at the 8:00 position 2 cm from the nipple. There is an adjacent 0.5 x 0.5 x 0.4 cm mass. She presents now to discuss excision.  Past Medical History  Diagnosis Date  . HLD (hyperlipidemia)   . GERD (gastroesophageal reflux disease)   . Cholelithiasis   . Allergy     Past Surgical History  Procedure Laterality Date  . Ankle fracture surgery  1966    right    Family History  Problem Relation Age of Onset  . Diabetes Mother   . Hypertension Mother   . Colon polyps Mother 80  . Diabetes Father   . Pancreatic cancer Maternal Aunt   . Colon cancer Neg Hx     Social History History  Substance Use Topics  . Smoking status: Never Smoker   . Smokeless tobacco: Never Used  . Alcohol Use: No    Allergies  Allergen Reactions  . Shellfish Allergy Nausea Only    Current Outpatient Prescriptions  Medication Sig Dispense Refill  . NON FORMULARY Raw vinegar      . Red Yeast Rice Extract (RED YEAST RICE PO) Take 2 tablets by mouth daily.       No current facility-administered medications for this visit.    Review of Systems Review of Systems  Constitutional: Negative for fever, chills and unexpected weight change.  HENT: Negative for hearing loss, congestion, sore throat, trouble swallowing and voice  change.   Eyes: Negative for visual disturbance.  Respiratory: Negative for cough and wheezing.   Cardiovascular: Negative for chest pain, palpitations and leg swelling.  Gastrointestinal: Negative for nausea, vomiting, abdominal pain, diarrhea, constipation, blood in stool, abdominal distention and anal bleeding.  Genitourinary: Negative for hematuria, vaginal bleeding and difficulty urinating.  Musculoskeletal: Negative for arthralgias.  Skin: Negative for rash and wound.  Neurological: Negative for seizures, syncope and headaches.  Hematological: Negative for adenopathy. Does not bruise/bleed easily.  Psychiatric/Behavioral: Negative for confusion.    Blood pressure 142/86, pulse 80, temperature 97.9 F (36.6 C), temperature source Oral, resp. rate 16, height 5' 4" (1.626 m), weight 256 lb 3.2 oz (116.212 kg).  Menarche age 14 First pregnancy age 34 Breastfeed no Hormones - no Family history - no  Physical Exam Physical Exam WDWN in NAD HEENT:  EOMI, sclera anicteric Neck:  No masses, no thyromegaly Lungs:  CTA bilaterally; normal respiratory effort Breasts:  Symmetric; no lymphadenopathy; no palpable masses in right breast; left breast at 8:00 - small 1 cm palpable mass;  No nipple discharge or retraction CV:  Regular rate and rhythm; no murmurs Abd:  +bowel sounds, soft, non-tender, no masses Ext:  Well-perfused; no edema Skin:  Warm, dry; no sign of jaundice  Data Reviewed RADIOLOGY REPORT*    Clinical Data: Mass felt by the patient in the lower inner left  breast in the region of a previously biopsied mass. The patient  has not been able to feel a mass at that location before. The mass  was biopsied at [facility] on 08/01/2009 with a  pathological diagnosis of intraductal papilloma with usual ductal  hyperplasia.  DIGITAL DIAGNOSTIC LEFT MAMMOGRAM WITH CAD AND LEFT BREAST  ULTRASOUND:  Comparison: Previous examinations, the most recent dated  09/03/2012.   Findings:  ACR Breast Density Category a: The breast tissue is almost  entirely fatty.  A minimally lobulated and slightly ill-defined rounded mass  containing a biopsy marker clip in the lower inner left breast is  minimally larger compared to the previous examinations. There has  also been a minimal increase in size of an adjacent small, rounded  mass. These are at the location of the mass felt by the patient,  marked with a metallic marker.  Mammographic images were processed with CAD.  On physical exam, there is a faintly palpable 1 cm rounded mass in  the 8 o'clock position of the left breast 2 cm from the nipple.  Ultrasound is performed, showing a 1.5 x 1.3 x 1.2 cm rounded,  lobulated, smoothly marginated solid mass in the 8 o'clock position  of the left breast, 2 cm from the nipple. This measured 1.4 x 1.1  x 1.0 cm on 07/27/2010. There is an adjacent 0.5 x 0.5 x 0.4 cm  similar appearing mass.  IMPRESSION:  Minimal interval increase in size of the previously biopsied left  breast intraductal papilloma and adjacent small satellite lesion.  Surgical consultation for possible surgical excision is  recommended. This has been discussed with the patient and she was  given an appointment with Dr. Kamaljit Hizer at 9:30 a.m. on 09/01/2013.  She will be due for a bilateral screening mammogram in 1 month.  RECOMMENDATION:  1. Surgical consultation for possible surgical excision of the  previously biopsied papilloma in the left breast. This has been  arranged, as described above.  2. Bilateral screening mammogram in 1 month.  I have discussed the findings and recommendations with the patient.  Results were also provided in writing at the conclusion of the  visit. If applicable, a reminder letter will be sent to the  patient regarding the next appointment.  BI-RADS CATEGORY 4: Suspicious abnormality - biopsy should be  considered.  Original Report Authenticated By: Steven Reid,  M.D.   Assessment    Left breast mass     Plan    Left needle-localized lumpectomy.  The surgical procedure has been discussed with the patient.  Potential risks, benefits, alternative treatments, and expected outcomes have been explained.  All of the patient's questions at this time have been answered.  The likelihood of reaching the patient's treatment goal is good.  The patient understand the proposed surgical procedure and wishes to proceed.         Everett Ehrler K. 09/01/2013, 9:44 AM    

## 2013-09-02 ENCOUNTER — Encounter (HOSPITAL_COMMUNITY): Payer: Self-pay | Admitting: Pharmacy Technician

## 2013-09-04 ENCOUNTER — Other Ambulatory Visit: Payer: Self-pay

## 2013-09-04 DIAGNOSIS — Z1231 Encounter for screening mammogram for malignant neoplasm of breast: Secondary | ICD-10-CM

## 2013-09-08 ENCOUNTER — Encounter (HOSPITAL_COMMUNITY): Payer: Self-pay

## 2013-09-08 ENCOUNTER — Encounter (HOSPITAL_COMMUNITY)
Admission: RE | Admit: 2013-09-08 | Discharge: 2013-09-08 | Disposition: A | Discharge status: Home / Self Care | Payer: BC Managed Care – PPO | Source: Ambulatory Visit | Attending: Surgery | Admitting: Surgery

## 2013-09-08 HISTORY — DX: Malignant (primary) neoplasm, unspecified: C80.1

## 2013-09-08 LAB — CBC
MCH: 24.7 pg — ABNORMAL LOW (ref 26.0–34.0)
Platelets: 211 10*3/uL (ref 150–400)
RBC: 4.82 MIL/uL (ref 3.87–5.11)
RDW: 16.1 % — ABNORMAL HIGH (ref 11.5–15.5)
WBC: 5.5 10*3/uL (ref 4.0–10.5)

## 2013-09-08 LAB — BASIC METABOLIC PANEL
CO2: 25 mEq/L (ref 19–32)
Calcium: 9.1 mg/dL (ref 8.4–10.5)
Chloride: 102 mEq/L (ref 96–112)
Creatinine, Ser: 0.98 mg/dL (ref 0.50–1.10)
GFR calc Af Amer: 71 mL/min — ABNORMAL LOW (ref >90)
Sodium: 137 mEq/L (ref 135–145)

## 2013-09-08 MED ORDER — CEFAZOLIN SODIUM-DEXTROSE 2-3 GM-% IV SOLR
2.0000 g | INTRAVENOUS | Status: AC
  Administered 2013-09-09: 2 g via INTRAVENOUS
  Filled 2013-09-08: qty 50

## 2013-09-08 NOTE — Pre-Procedure Instructions (Signed)
Halo Shevlin  09/08/2013   Your procedure is scheduled on:  09/09/13  Report to [facility] at 745 AM.  Call this number if you have problems the morning of surgery: [phone]   Remember:   Do not eat food or drink liquids after midnight.   Take these medicines the morning of surgery with A SIP OF WATER: none   Do not wear jewelry, make-up or nail polish.  Do not wear lotions, powders, or perfumes. You may wear deodorant.  Do not shave 48 hours prior to surgery. Men may shave face and neck.  Do not bring valuables to the hospital.  [facility] is not responsible                   for any belongings or valuables.  Contacts, dentures or bridgework may not be worn into surgery.  Leave suitcase in the car. After surgery it may be brought to your room.  For patients admitted to the hospital, checkout time is 11:00 AM the day of  discharge.   Patients discharged the day of surgery will not be allowed to drive  home.  Name and phone number of your driver: family  Special Instructions: Incentive Spirometry - Practice and bring it with you on the day of surgery.   Please read over the following fact sheets that you were given: Pain Booklet, Coughing and Deep Breathing and Surgical Site Infection Prevention

## 2013-09-09 ENCOUNTER — Ambulatory Visit (HOSPITAL_COMMUNITY): Payer: BC Managed Care – PPO | Admitting: Certified Registered"

## 2013-09-09 ENCOUNTER — Ambulatory Visit
Admission: RE | Admit: 2013-09-09 | Discharge: 2013-09-09 | Disposition: A | Discharge status: Home / Self Care | Payer: BC Managed Care – PPO | Source: Ambulatory Visit | Attending: Surgery | Admitting: Surgery

## 2013-09-09 ENCOUNTER — Encounter (HOSPITAL_COMMUNITY)
Admission: RE | Disposition: A | Discharge status: Home / Self Care | Payer: Self-pay | Source: Ambulatory Visit | Attending: Surgery

## 2013-09-09 ENCOUNTER — Encounter (HOSPITAL_COMMUNITY): Payer: Self-pay | Admitting: Certified Registered"

## 2013-09-09 ENCOUNTER — Ambulatory Visit (HOSPITAL_COMMUNITY)
Admission: RE | Admit: 2013-09-09 | Discharge: 2013-09-09 | Disposition: A | Discharge status: Home / Self Care | Payer: BC Managed Care – PPO | Source: Ambulatory Visit | Attending: Surgery | Admitting: Surgery

## 2013-09-09 ENCOUNTER — Encounter (HOSPITAL_COMMUNITY): Payer: Self-pay | Admitting: *Deleted

## 2013-09-09 DIAGNOSIS — D249 Benign neoplasm of unspecified breast: Secondary | ICD-10-CM

## 2013-09-09 DIAGNOSIS — N6320 Unspecified lump in the left breast, unspecified quadrant: Secondary | ICD-10-CM

## 2013-09-09 DIAGNOSIS — K219 Gastro-esophageal reflux disease without esophagitis: Secondary | ICD-10-CM | POA: Insufficient documentation

## 2013-09-09 DIAGNOSIS — E785 Hyperlipidemia, unspecified: Secondary | ICD-10-CM | POA: Insufficient documentation

## 2013-09-09 HISTORY — PX: BREAST LUMPECTOMY WITH NEEDLE LOCALIZATION: SHX5759

## 2013-09-09 SURGERY — BREAST LUMPECTOMY WITH NEEDLE LOCALIZATION
Anesthesia: General | Site: Breast | Laterality: Left | Wound class: Clean

## 2013-09-09 MED ORDER — CHLORHEXIDINE GLUCONATE 4 % EX LIQD: 1.0000 "application " | Freq: Once | CUTANEOUS | Status: DC

## 2013-09-09 MED ORDER — HYDROMORPHONE HCL PF 1 MG/ML IJ SOLN
INTRAMUSCULAR | Status: AC
  Filled 2013-09-09: qty 1

## 2013-09-09 MED ORDER — LACTATED RINGERS IV SOLN
INTRAVENOUS | Status: DC
  Administered 2013-09-09 (×2): via INTRAVENOUS

## 2013-09-09 MED ORDER — MORPHINE SULFATE 2 MG/ML IJ SOLN: 2.0000 mg | INTRAMUSCULAR | Status: DC | PRN

## 2013-09-09 MED ORDER — OXYCODONE HCL 5 MG PO TABS: 5.0000 mg | ORAL_TABLET | Freq: Once | ORAL | Status: DC | PRN

## 2013-09-09 MED ORDER — PROPOFOL 10 MG/ML IV BOLUS
INTRAVENOUS | Status: DC | PRN
  Administered 2013-09-09: 200 mg via INTRAVENOUS

## 2013-09-09 MED ORDER — FENTANYL CITRATE 0.05 MG/ML IJ SOLN
INTRAMUSCULAR | Status: DC | PRN
  Administered 2013-09-09: 100 ug via INTRAVENOUS

## 2013-09-09 MED ORDER — ONDANSETRON HCL 4 MG/2ML IJ SOLN
4.0000 mg | Freq: Once | INTRAMUSCULAR | Status: AC | PRN
  Administered 2013-09-09: 4 mg via INTRAVENOUS

## 2013-09-09 MED ORDER — HYDROCODONE-ACETAMINOPHEN 5-325 MG PO TABS: 1.0000 | ORAL_TABLET | ORAL | Status: DC | PRN

## 2013-09-09 MED ORDER — HYDROMORPHONE HCL PF 1 MG/ML IJ SOLN
0.2500 mg | INTRAMUSCULAR | Status: DC | PRN
  Administered 2013-09-09: 0.5 mg via INTRAVENOUS

## 2013-09-09 MED ORDER — BUPIVACAINE-EPINEPHRINE PF 0.25-1:200000 % IJ SOLN
INTRAMUSCULAR | Status: AC
  Filled 2013-09-09: qty 30

## 2013-09-09 MED ORDER — OXYCODONE HCL 5 MG/5ML PO SOLN: 5.0000 mg | Freq: Once | ORAL | Status: DC | PRN

## 2013-09-09 MED ORDER — MEPERIDINE HCL 25 MG/ML IJ SOLN: 6.2500 mg | INTRAMUSCULAR | Status: DC | PRN

## 2013-09-09 MED ORDER — ONDANSETRON HCL 4 MG/2ML IJ SOLN: 4.0000 mg | INTRAMUSCULAR | Status: DC | PRN

## 2013-09-09 MED ORDER — ONDANSETRON HCL 4 MG/2ML IJ SOLN
INTRAMUSCULAR | Status: DC | PRN
  Administered 2013-09-09: 4 mg via INTRAVENOUS

## 2013-09-09 MED ORDER — MIDAZOLAM HCL 5 MG/5ML IJ SOLN
INTRAMUSCULAR | Status: DC | PRN
  Administered 2013-09-09: 2 mg via INTRAVENOUS

## 2013-09-09 MED ORDER — 0.9 % SODIUM CHLORIDE (POUR BTL) OPTIME
TOPICAL | Status: DC | PRN
  Administered 2013-09-09: 1000 mL

## 2013-09-09 MED ORDER — LIDOCAINE HCL (CARDIAC) 20 MG/ML IV SOLN
INTRAVENOUS | Status: DC | PRN
  Administered 2013-09-09: 100 mg via INTRAVENOUS

## 2013-09-09 MED ORDER — BUPIVACAINE-EPINEPHRINE 0.25% -1:200000 IJ SOLN
INTRAMUSCULAR | Status: DC | PRN
  Administered 2013-09-09: 10 mL

## 2013-09-09 MED ORDER — ONDANSETRON HCL 4 MG/2ML IJ SOLN
INTRAMUSCULAR | Status: AC
  Administered 2013-09-09: 4 mg via INTRAVENOUS
  Filled 2013-09-09: qty 2

## 2013-09-09 SURGICAL SUPPLY — 47 items
APL SKNCLS STERI-STRIP NONHPOA (GAUZE/BANDAGES/DRESSINGS) ×1
APPLIER CLIP 9.375 MED OPEN (MISCELLANEOUS)
APR CLP MED 9.3 20 MLT OPN (MISCELLANEOUS)
BENZOIN TINCTURE PRP APPL 2/3 (GAUZE/BANDAGES/DRESSINGS) ×2 IMPLANT
BINDER BREAST LRG (GAUZE/BANDAGES/DRESSINGS) IMPLANT
BINDER BREAST XLRG (GAUZE/BANDAGES/DRESSINGS) IMPLANT
BLADE SURG ROTATE 9660 (MISCELLANEOUS) IMPLANT
CHLORAPREP W/TINT 26ML (MISCELLANEOUS) ×2 IMPLANT
CLIP APPLIE 9.375 MED OPEN (MISCELLANEOUS) IMPLANT
CLIP TI WIDE RED SMALL 24 (CLIP) IMPLANT
CLOTH BEACON ORANGE TIMEOUT ST (SAFETY) ×2 IMPLANT
CONT SPEC 4OZ CLIKSEAL STRL BL (MISCELLANEOUS) ×2 IMPLANT
COVER SURGICAL LIGHT HANDLE (MISCELLANEOUS) ×2 IMPLANT
DECANTER SPIKE VIAL GLASS SM (MISCELLANEOUS) IMPLANT
DEVICE DUBIN SPECIMEN MAMMOGRA (MISCELLANEOUS) IMPLANT
DRAPE LAPAROTOMY TRNSV 102X78 (DRAPE) ×2 IMPLANT
DRAPE UTILITY 15X26 W/TAPE STR (DRAPE) ×4 IMPLANT
DRSG TEGADERM 4X4.75 (GAUZE/BANDAGES/DRESSINGS) ×1 IMPLANT
ELECT REM PT RETURN 9FT ADLT (ELECTROSURGICAL) ×2
ELECTRODE REM PT RTRN 9FT ADLT (ELECTROSURGICAL) ×1 IMPLANT
GAUZE SPONGE 4X4 16PLY XRAY LF (GAUZE/BANDAGES/DRESSINGS) ×2 IMPLANT
GLOVE BIO SURGEON STRL SZ7 (GLOVE) ×2 IMPLANT
GLOVE BIO SURGEON STRL SZ7.5 (GLOVE) ×1 IMPLANT
GLOVE BIOGEL PI IND STRL 7.0 (GLOVE) IMPLANT
GLOVE BIOGEL PI IND STRL 7.5 (GLOVE) ×1 IMPLANT
GLOVE BIOGEL PI INDICATOR 7.0 (GLOVE) ×1
GLOVE BIOGEL PI INDICATOR 7.5 (GLOVE) ×2
GOWN STRL NON-REIN LRG LVL3 (GOWN DISPOSABLE) ×4 IMPLANT
KIT BASIN OR (CUSTOM PROCEDURE TRAY) ×2 IMPLANT
KIT MARKER MARGIN INK (KITS) IMPLANT
KIT ROOM TURNOVER OR (KITS) ×2 IMPLANT
NDL HYPO 25GX1X1/2 BEV (NEEDLE) ×1 IMPLANT
NEEDLE HYPO 25GX1X1/2 BEV (NEEDLE) ×2 IMPLANT
NS IRRIG 1000ML POUR BTL (IV SOLUTION) ×2 IMPLANT
PACK GENERAL/GYN (CUSTOM PROCEDURE TRAY) ×2 IMPLANT
PAD ARMBOARD 7.5X6 YLW CONV (MISCELLANEOUS) ×4 IMPLANT
SPECIMEN JAR MEDIUM (MISCELLANEOUS) ×2 IMPLANT
SPONGE GAUZE 4X4 12PLY (GAUZE/BANDAGES/DRESSINGS) ×2 IMPLANT
STAPLER VISISTAT 35W (STAPLE) ×2 IMPLANT
STRIP CLOSURE SKIN 1/2X4 (GAUZE/BANDAGES/DRESSINGS) ×2 IMPLANT
SUT MNCRL AB 4-0 PS2 18 (SUTURE) ×2 IMPLANT
SUT VIC AB 3-0 SH 27 (SUTURE) ×2
SUT VIC AB 3-0 SH 27X BRD (SUTURE) ×1 IMPLANT
SYR CONTROL 10ML LL (SYRINGE) ×2 IMPLANT
TOWEL OR 17X24 6PK STRL BLUE (TOWEL DISPOSABLE) ×2 IMPLANT
TOWEL OR 17X26 10 PK STRL BLUE (TOWEL DISPOSABLE) ×2 IMPLANT
WATER STERILE IRR 1000ML POUR (IV SOLUTION) IMPLANT

## 2013-09-09 NOTE — Transfer of Care (Signed)
Immediate Anesthesia Transfer of Care Note  Patient: Tina Stone  Procedure(s) Performed: Procedure(s): BREAST LUMPECTOMY WITH NEEDLE LOCALIZATION (Left)  Patient Location: PACU  Anesthesia Type:General  Level of Consciousness: awake, alert , oriented and patient cooperative  Airway & Oxygen Therapy: Patient Spontanous Breathing and Patient connected to nasal cannula oxygen  Post-op Assessment: Report given to PACU RN, Post -op Vital signs reviewed and stable and Patient moving all extremities  Post vital signs: Reviewed and stable  Complications: No apparent anesthesia complications

## 2013-09-09 NOTE — Anesthesia Procedure Notes (Signed)
Procedure Name: LMA Insertion Date/Time: 09/09/2013 9:28 AM Performed by: Jerilee Hoh Pre-anesthesia Checklist: Patient identified, Emergency Drugs available, Suction available and Patient being monitored Patient Re-evaluated:Patient Re-evaluated prior to inductionOxygen Delivery Method: Circle system utilized Preoxygenation: Pre-oxygenation with 100% oxygen Intubation Type: IV induction LMA: LMA inserted LMA Size: 4.0 Tube type: Oral Number of attempts: 1 Placement Confirmation: positive ETCO2 and breath sounds checked- equal and bilateral Tube secured with: Tape Dental Injury: Teeth and Oropharynx as per pre-operative assessment

## 2013-09-09 NOTE — Preoperative (Signed)
Beta Blockers   Reason not to administer Beta Blockers:Not Applicable 

## 2013-09-09 NOTE — Interval H&P Note (Signed)
History and Physical Interval Note:  09/09/2013 8:01 AM  Tina Stone  has presented today for surgery, with the diagnosis of left breast mass  The various methods of treatment have been discussed with the patient and family. After consideration of risks, benefits and other options for treatment, the patient has consented to  Procedure(s): BREAST LUMPECTOMY WITH NEEDLE LOCALIZATION (Left) as a surgical intervention .  The patient's history has been reviewed, patient examined, no change in status, stable for surgery.  I have reviewed the patient's chart and labs.  Questions were answered to the patient's satisfaction.     [name]

## 2013-09-09 NOTE — Anesthesia Postprocedure Evaluation (Signed)
Anesthesia Post Note  Patient: Tina Stone  Procedure(s) Performed: Procedure(s) (LRB): BREAST LUMPECTOMY WITH NEEDLE LOCALIZATION (Left)  Anesthesia type: general  Patient location: PACU  Post pain: Pain level controlled  Post assessment: Patient's Cardiovascular Status Stable  Last Vitals:  Filed Vitals:   09/09/13 1130  BP: 162/70  Pulse: 73  Temp:   Resp: 19    Post vital signs: Reviewed and stable  Level of consciousness: sedated  Complications: No apparent anesthesia complications

## 2013-09-09 NOTE — H&P (View-Only) (Signed)
Patient ID: Tina Stone, female   DOB: [birth year], 63 y.o.   MRN: [id number]  Chief Complaint  Patient presents with  . New Evaluation    eval lt br papilloma    HPI Tina Stone is a 63 y.o. female.  Referred by Dr. Lequita Halt for evaluation of enlarging left breast mass HPI This is a 63 year old female in good health who presents with a 4 year history of a palpable mass in her lower inner quadrant of her left breast. This was biopsied in 2010 and was shown to be A. Intraductal papilloma with usual ductal hyperplasia. The decision was made that time not to resect this area. It has enlarged slightly as confirmed by mammogram and ultrasound.  It now measures 1.5 x 1.3 x 1.2 cm at the 8:00 position 2 cm from the nipple. There is an adjacent 0.5 x 0.5 x 0.4 cm mass. She presents now to discuss excision.  Past Medical History  Diagnosis Date  . HLD (hyperlipidemia)   . GERD (gastroesophageal reflux disease)   . Cholelithiasis   . Allergy     Past Surgical History  Procedure Laterality Date  . Ankle fracture surgery  1966    right    Family History  Problem Relation Age of Onset  . Diabetes Mother   . Hypertension Mother   . Colon polyps Mother 36  . Diabetes Father   . Pancreatic cancer Maternal Aunt   . Colon cancer Neg Hx     Social History History  Substance Use Topics  . Smoking status: Never Smoker   . Smokeless tobacco: Never Used  . Alcohol Use: No    Allergies  Allergen Reactions  . Shellfish Allergy Nausea Only    Current Outpatient Prescriptions  Medication Sig Dispense Refill  . NON FORMULARY Raw vinegar      . Red Yeast Rice Extract (RED YEAST RICE PO) Take 2 tablets by mouth daily.       No current facility-administered medications for this visit.    Review of Systems Review of Systems  Constitutional: Negative for fever, chills and unexpected weight change.  HENT: Negative for hearing loss, congestion, sore throat, trouble swallowing and voice  change.   Eyes: Negative for visual disturbance.  Respiratory: Negative for cough and wheezing.   Cardiovascular: Negative for chest pain, palpitations and leg swelling.  Gastrointestinal: Negative for nausea, vomiting, abdominal pain, diarrhea, constipation, blood in stool, abdominal distention and anal bleeding.  Genitourinary: Negative for hematuria, vaginal bleeding and difficulty urinating.  Musculoskeletal: Negative for arthralgias.  Skin: Negative for rash and wound.  Neurological: Negative for seizures, syncope and headaches.  Hematological: Negative for adenopathy. Does not bruise/bleed easily.  Psychiatric/Behavioral: Negative for confusion.    Blood pressure 142/86, pulse 80, temperature 97.9 F (36.6 C), temperature source Oral, resp. rate 16, height 5\' 4"  (1.626 m), weight 256 lb 3.2 oz (116.212 kg).  Menarche age 43 First pregnancy age 65 Breastfeed no Hormones - no Family history - no  Physical Exam Physical Exam WDWN in NAD HEENT:  EOMI, sclera anicteric Neck:  No masses, no thyromegaly Lungs:  CTA bilaterally; normal respiratory effort Breasts:  Symmetric; no lymphadenopathy; no palpable masses in right breast; left breast at 8:00 - small 1 cm palpable mass;  No nipple discharge or retraction CV:  Regular rate and rhythm; no murmurs Abd:  +bowel sounds, soft, non-tender, no masses Ext:  Well-perfused; no edema Skin:  Warm, dry; no sign of jaundice  Data Reviewed RADIOLOGY REPORT*  Clinical Data: Mass felt by the patient in the lower inner left  breast in the region of a previously biopsied mass. The patient  has not been able to feel a mass at that location before. The mass  was biopsied at [facility] on 08/01/2009 with a  pathological diagnosis of intraductal papilloma with usual ductal  hyperplasia.  DIGITAL DIAGNOSTIC LEFT MAMMOGRAM WITH CAD AND LEFT BREAST  ULTRASOUND:  Comparison: Previous examinations, the most recent dated  09/03/2012.   Findings:  ACR Breast Density Category a: The breast tissue is almost  entirely fatty.  A minimally lobulated and slightly ill-defined rounded mass  containing a biopsy marker clip in the lower inner left breast is  minimally larger compared to the previous examinations. There has  also been a minimal increase in size of an adjacent small, rounded  mass. These are at the location of the mass felt by the patient,  marked with a metallic marker.  Mammographic images were processed with CAD.  On physical exam, there is a faintly palpable 1 cm rounded mass in  the 8 o'clock position of the left breast 2 cm from the nipple.  Ultrasound is performed, showing a 1.5 x 1.3 x 1.2 cm rounded,  lobulated, smoothly marginated solid mass in the 8 o'clock position  of the left breast, 2 cm from the nipple. This measured 1.4 x 1.1  x 1.0 cm on 07/27/2010. There is an adjacent 0.5 x 0.5 x 0.4 cm  similar appearing mass.  IMPRESSION:  Minimal interval increase in size of the previously biopsied left  breast intraductal papilloma and adjacent small satellite lesion.  Surgical consultation for possible surgical excision is  recommended. This has been discussed with the patient and she was  given an appointment with Dr. Corliss Skains at 9:30 a.m. on 09/01/2013.  She will be due for a bilateral screening mammogram in 1 month.  RECOMMENDATION:  1. Surgical consultation for possible surgical excision of the  previously biopsied papilloma in the left breast. This has been  arranged, as described above.  2. Bilateral screening mammogram in 1 month.  I have discussed the findings and recommendations with the patient.  Results were also provided in writing at the conclusion of the  visit. If applicable, a reminder letter will be sent to the  patient regarding the next appointment.  BI-RADS CATEGORY 4: Suspicious abnormality - biopsy should be  considered.  Original Report Authenticated By: Beckie Salts,  M.D.   Assessment    Left breast mass     Plan    Left needle-localized lumpectomy.  The surgical procedure has been discussed with the patient.  Potential risks, benefits, alternative treatments, and expected outcomes have been explained.  All of the patient's questions at this time have been answered.  The likelihood of reaching the patient's treatment goal is good.  The patient understand the proposed surgical procedure and wishes to proceed.         Lonisha Bobby K. 09/01/2013, 9:44 AM

## 2013-09-09 NOTE — Anesthesia Preprocedure Evaluation (Signed)
Anesthesia Evaluation  Patient identified by MRN, date of birth, ID band Patient awake    Reviewed: Allergy & Precautions, H&P , NPO status , Patient's Chart, lab work & pertinent test results  Airway Mallampati: I TM Distance: >3 FB Neck ROM: Full    Dental   Pulmonary          Cardiovascular     Neuro/Psych    GI/Hepatic GERD-  Medicated and Controlled,  Endo/Other    Renal/GU      Musculoskeletal   Abdominal   Peds  Hematology   Anesthesia Other Findings   Reproductive/Obstetrics                           Anesthesia Physical Anesthesia Plan  ASA: II  Anesthesia Plan: General   Post-op Pain Management:    Induction: Intravenous  Airway Management Planned: LMA  Additional Equipment:   Intra-op Plan:   Post-operative Plan: Extubation in OR  Informed Consent: I have reviewed the patients History and Physical, chart, labs and discussed the procedure including the risks, benefits and alternatives for the proposed anesthesia with the patient or authorized representative who has indicated his/her understanding and acceptance.     Plan Discussed with: CRNA and Surgeon  Anesthesia Plan Comments:         Anesthesia Quick Evaluation  

## 2013-09-09 NOTE — Op Note (Signed)
Pre-op Diagnosis:  Enlarging intraductal papilloma - left breast Post-op Diagnosis:  Same Procedure:  Left needle-localized lumpectomy Surgeon:  Maysin Carstens K. Anesthesia: Gen. Via LMA Indications: This is a 64 year old female who presents with a 4 year history of an enlarging mass in the left lower inner quadrant of the breast. A previous biopsy showed this to be an intraductal papilloma with usual ductal hyperplasia. It has enlarged slightly and there is a new adjacent 5 mm mass. She comes in today for excision of both of these. A wire was placed by radiology to the smaller mass. The original mass is anterior to the wire.  Description of procedure: The patient brought to the operating room and placed in supine position on the operating room table. After an adequate level of general anesthesia was obtained the patient's left breast was prepped with chlor prep and draped in sterile fashion. A timeout was taken to ensure the proper patient and proper procedure. The wire is just above the inframammary crease oriented in a inferior to superior medial to lateral direction. We made a transverse incision around the wire. We raised skin flaps superiorly. We cut out a generous cylinder of breast tissue around the wire extending for a depth of about 6 cm. The lumpectomy was removed and was oriented with a paint kit. A specimen mammogram showed that the tip of the wire within the specimen as well as the previously biopsied papilloma. We inspected for hemostasis. We placed clips in the five margins of the biopsy cavity. We closed the wound with a deep layer of 3-0 Vicryl. 4-0 Monocryl was used to close the skin. Steri-Strips and clean dressings were applied. The patient was extubated and brought to the recovery room in stable condition. All sponge, instrument, and needle counts are correct.   Tina Stone. Corliss Skains, MD, [facility] Surgery  General/ Trauma Surgery  09/09/2013 10:37 AM

## 2013-09-11 ENCOUNTER — Encounter (HOSPITAL_COMMUNITY): Payer: Self-pay | Admitting: Surgery

## 2013-09-11 ENCOUNTER — Telehealth (INDEPENDENT_AMBULATORY_CARE_PROVIDER_SITE_OTHER): Payer: Self-pay | Admitting: General Surgery

## 2013-09-11 NOTE — Telephone Encounter (Signed)
Called patient to let her know that her path results is normal no sign of cancer. I remind her of her apt with Dr Corliss Skains on 09-24-13

## 2013-09-23 ENCOUNTER — Ambulatory Visit
Admission: RE | Admit: 2013-09-23 | Discharge: 2013-09-23 | Disposition: A | Discharge status: Home / Self Care | Payer: BC Managed Care – PPO | Source: Ambulatory Visit

## 2013-09-23 DIAGNOSIS — Z1231 Encounter for screening mammogram for malignant neoplasm of breast: Secondary | ICD-10-CM

## 2013-09-24 ENCOUNTER — Encounter (INDEPENDENT_AMBULATORY_CARE_PROVIDER_SITE_OTHER): Payer: Self-pay | Admitting: Surgery

## 2013-09-24 ENCOUNTER — Ambulatory Visit (INDEPENDENT_AMBULATORY_CARE_PROVIDER_SITE_OTHER): Payer: BC Managed Care – PPO | Admitting: Surgery

## 2013-09-24 VITALS — BP 122/76 | HR 68 | Temp 98.0°F | Resp 14 | Ht 64.0 in | Wt 254.6 lb

## 2013-09-24 DIAGNOSIS — D242 Benign neoplasm of left breast: Secondary | ICD-10-CM | POA: Insufficient documentation

## 2013-09-24 DIAGNOSIS — D249 Benign neoplasm of unspecified breast: Secondary | ICD-10-CM

## 2013-09-24 NOTE — Progress Notes (Signed)
Status post left needle localized lumpectomy on 09/09/13. The pathology showed only intraductal papilloma with no sign of malignancy. The incision is well healed. Minimal tenderness. She may resume full duty. She may have her regular mammogram in about a month. Follow-up PRN.  Wilmon Arms. Corliss Skains, MD, [facility] Surgery  General/ Trauma Surgery  09/24/2013 9:25 AM

## 2013-09-28 ENCOUNTER — Encounter: Payer: Self-pay | Admitting: Gastroenterology

## 2013-09-28 ENCOUNTER — Ambulatory Visit (AMBULATORY_SURGERY_CENTER): Payer: BC Managed Care – PPO | Admitting: Gastroenterology

## 2013-09-28 ENCOUNTER — Telehealth (INDEPENDENT_AMBULATORY_CARE_PROVIDER_SITE_OTHER): Payer: Self-pay | Admitting: General Surgery

## 2013-09-28 VITALS — BP 120/74 | HR 58 | Temp 98.8°F | Resp 14 | Ht 64.0 in | Wt 258.0 lb

## 2013-09-28 DIAGNOSIS — D126 Benign neoplasm of colon, unspecified: Secondary | ICD-10-CM

## 2013-09-28 DIAGNOSIS — Z1211 Encounter for screening for malignant neoplasm of colon: Secondary | ICD-10-CM

## 2013-09-28 MED ORDER — SODIUM CHLORIDE 0.9 % IV SOLN: 500.0000 mL | INTRAVENOUS | Status: DC

## 2013-09-28 NOTE — Telephone Encounter (Signed)
Pt called to report the small lump in her breast, noted by Dr. Corliss Skains at OV last week, has "blown up" to the "size of a baseball" since Saturday.  It is not painful, but is hard.  Offered appt at Urgent office, but was declined for today because she just had a colonoscopy.  Pt will call in early a m to schedule into Urgent for tomorrow.

## 2013-09-28 NOTE — Progress Notes (Signed)
Pt. Had lumpectomy 09/09/13 of left breast.  Breast is swollen.

## 2013-09-28 NOTE — Op Note (Signed)
[facility] [address] [place], [address]   COLONOSCOPY PROCEDURE REPORT  PATIENT: Tina Stone, Tina Stone  MR#: [id number] BIRTHDATE: Dec 01, 1950 , 62  yrs. old GENDER: Female ENDOSCOPIST: Louis Meckel, MD REFERRED [id number] Izora Ribas, M.D. PROCEDURE DATE:  09/28/2013 PROCEDURE:   Colonoscopy with cold biopsy polypectomy and Colonoscopy with snare polypectomy First Screening Colonoscopy - Avg.  risk and is 50 yrs.  old or older Yes.  Prior Negative Screening - Now for repeat screening. N/A  History of Adenoma - Now for follow-up colonoscopy & has been > or = to 3 yrs.  N/A  Polyps Removed Today? Yes. ASA CLASS:   Class II INDICATIONS:Average risk patient for colon cancer. MEDICATIONS: MAC sedation, administered by CRNA and propofol (Diprivan) 250mg  IV  DESCRIPTION OF PROCEDURE:   After the risks benefits and alternatives of the procedure were thoroughly explained, informed consent was obtained.  A digital rectal exam revealed no abnormalities of the rectum.   The [id number]  endoscope was introduced through the anus and advanced to the cecum, which was identified by both the appendix and ileocecal valve. No adverse events experienced.   The quality of the prep was excellent using Suprep  The instrument was then slowly withdrawn as the colon was fully examined.      COLON FINDINGS: A sessile polyp measuring 2 mm in size was found at the cecum.  A polypectomy was performed with cold forceps.   A sessile polyp measuring 3 mm in size was found in the descending colon.  A polypectomy was performed with a cold snare.  The resection was complete and the polyp tissue was completely retrieved.   Mild diverticulosis was noted in the ascending colon. The colon mucosa was otherwise normal.  Retroflexed views revealed no abnormalities. The time to cecum=2 minutes 79 seconds. Withdrawal time=14 minutes 06 seconds.  The scope was withdrawn and the procedure  completed. COMPLICATIONS: There were no complications.  ENDOSCOPIC IMPRESSION: 1.   Sessile polyp measuring 2 mm in size was found at the cecum; polypectomy was performed with cold forceps 2.   Sessile polyp measuring 3 mm in size was found in the descending colon; polypectomy was performed with a cold snare 3.   Mild diverticulosis was noted in the ascending colon 4.   The colon mucosa was otherwise normal  RECOMMENDATIONS: If the polyp(s) removed today are proven to be adenomatous (pre-cancerous) polyps, you will need a repeat colonoscopy in 5 years.  Otherwise you should continue to follow colorectal cancer screening guidelines for "routine risk" patients with colonoscopy in 10 years.  You will receive a letter within 1-2 weeks with the results of your biopsy as well as final recommendations.  Please call my office if you have not received a letter after 3 weeks.   eSigned:  Louis Meckel, MD 09/28/2013 10:59 AM   cc:   PATIENT NAME:  [name], [name] MR#: [id number]

## 2013-09-28 NOTE — Progress Notes (Signed)
Patient did not experience any of the following events: a burn prior to discharge; a fall within the facility; wrong site/side/patient/procedure/implant event; or a hospital transfer or hospital admission upon discharge from the facility. (G8907) Patient did not have preoperative order for IV antibiotic SSI prophylaxis. ([id number])  

## 2013-09-28 NOTE — Progress Notes (Signed)
Called to room to assist during endoscopic procedure.  Patient ID and intended procedure confirmed with present staff. Received instructions for my participation in the procedure from the performing physician.  

## 2013-09-28 NOTE — Patient Instructions (Addendum)
YOU HAD AN ENDOSCOPIC PROCEDURE TODAY AT THE [facility]: Refer to the procedure report that was given to you for any specific questions about what was found during the examination.  If the procedure report does not answer your questions, please call your gastroenterologist to clarify.  If you requested that your care partner not be given the details of your procedure findings, then the procedure report has been included in a sealed envelope for you to review at your convenience later.  YOU SHOULD EXPECT: Some feelings of bloating in the abdomen. Passage of more gas than usual.  Walking can help get rid of the air that was put into your GI tract during the procedure and reduce the bloating. If you had a lower endoscopy (such as a colonoscopy or flexible sigmoidoscopy) you may notice spotting of blood in your stool or on the toilet paper. If you underwent a bowel prep for your procedure, then you may not have a normal bowel movement for a few days.  DIET: Your first meal following the procedure should be a light meal and then it is ok to progress to your normal diet.  A half-sandwich or bowl of soup is an example of a good first meal.  Heavy or fried foods are harder to digest and may make you feel nauseous or bloated.  Likewise meals heavy in dairy and vegetables can cause extra gas to form and this can also increase the bloating.  Drink plenty of fluids but you should avoid alcoholic beverages for 24 hours.  ACTIVITY: Your care partner should take you home directly after the procedure.  You should plan to take it easy, moving slowly for the rest of the day.  You can resume normal activity the day after the procedure however you should NOT DRIVE or use heavy machinery for 24 hours (because of the sedation medicines used during the test).    SYMPTOMS TO REPORT IMMEDIATELY: A gastroenterologist can be reached at any hour.  During normal business hours, 8:30 AM to 5:00 PM Monday through Friday,  call [phone].  After hours and on weekends, please call the GI answering service at [phone] who will take a message and have the physician on call contact you.   Following lower endoscopy (colonoscopy or flexible sigmoidoscopy):  Excessive amounts of blood in the stool  Significant tenderness or worsening of abdominal pains  Swelling of the abdomen that is new, acute  Fever of 100F or higher   FOLLOW UP: If any biopsies were taken you will be contacted by phone or by letter within the next 1-3 weeks.  Call your gastroenterologist if you have not heard about the biopsies in 3 weeks.  Our staff will call the home number listed on your records the next business day following your procedure to check on you and address any questions or concerns that you may have at that time regarding the information given to you following your procedure. This is a courtesy call and so if there is no answer at the home number and we have not heard from you through the emergency physician on call, we will assume that you have returned to your regular daily activities without incident.  SIGNATURES/CONFIDENTIALITY: You and/or your care partner have signed paperwork which will be entered into your electronic medical record.  These signatures attest to the fact that that the information above on your After Visit Summary has been reviewed and is understood.  Full responsibility of the confidentiality of   this discharge information lies with you and/or your care-partner.   Resume medications. Information given on polyps,diverticulosis and high fiber diet with discharge instructions. 

## 2013-09-29 ENCOUNTER — Telehealth: Payer: Self-pay | Admitting: *Deleted

## 2013-09-29 ENCOUNTER — Ambulatory Visit (INDEPENDENT_AMBULATORY_CARE_PROVIDER_SITE_OTHER): Payer: BC Managed Care – PPO | Admitting: Surgery

## 2013-09-29 ENCOUNTER — Encounter (INDEPENDENT_AMBULATORY_CARE_PROVIDER_SITE_OTHER): Payer: Self-pay | Admitting: Surgery

## 2013-09-29 VITALS — BP 120/80 | HR 66 | Temp 99.0°F | Resp 14 | Ht 64.0 in | Wt 253.4 lb

## 2013-09-29 DIAGNOSIS — IMO0002 Reserved for concepts with insufficient information to code with codable children: Secondary | ICD-10-CM | POA: Insufficient documentation

## 2013-09-29 DIAGNOSIS — IMO0001 Reserved for inherently not codable concepts without codable children: Secondary | ICD-10-CM

## 2013-09-29 NOTE — Progress Notes (Signed)
The patient returns 3 weeks after a left breast lumpectomy by Dr. Corliss Skains  For papilloma. Over the weekend she developed left breast swelling and pain. No fever or chills. No drainage from incision.  Exam: She is afebrile stable vital signs.  Left breast shows baseball size seroma. No redness or drainage. I discussed aspiration and she agreed to let me do that. Under sterile conditions 55 cc of serous fluid was drawn off. She felt better after this.  Impression: Postop left breast seroma  Plan: Return next week for recheck. May require re\re aspiration. Wear supportive wall.

## 2013-09-29 NOTE — Patient Instructions (Signed)
WEAR COMPRESSIVE BRA.RETURN 1 WEEK.

## 2013-09-29 NOTE — Telephone Encounter (Signed)
  Follow up Call-  Call back number 09/28/2013  Post procedure Call Back phone  # [phone]  Permission to leave phone message Yes     Patient questions:  Do you have a fever, pain , or abdominal swelling? no Pain Score  0 *  Have you tolerated food without any problems? yes  Have you been able to return to your normal activities? yes  Do you have any questions about your discharge instructions: Diet   no Medications  no Follow up visit  no  Do you have questions or concerns about your Care? no  Actions: * If pain score is 4 or above: No action needed, pain <4.

## 2013-09-30 ENCOUNTER — Telehealth (INDEPENDENT_AMBULATORY_CARE_PROVIDER_SITE_OTHER): Payer: Self-pay | Admitting: *Deleted

## 2013-09-30 NOTE — Telephone Encounter (Signed)
Patient called to report that she was in late yesterday afternoon to have some fluid drained from her breast.  Patient state they told her it may have to be drained again.  Patient states this morning it is already up to a golf ball size but no pain.  Patient asking if she can wait until next week when she has an appt to see Dr. Corliss Skains 10/7.  Explained to patient to keep a close eye on the area but if it continues to retain fluid of golf ball size each day she will probably need to come in before then to have it redrained.  Patient states understanding and agreeable at this time.

## 2013-10-01 ENCOUNTER — Ambulatory Visit (INDEPENDENT_AMBULATORY_CARE_PROVIDER_SITE_OTHER): Payer: BC Managed Care – PPO | Admitting: General Surgery

## 2013-10-01 ENCOUNTER — Encounter (INDEPENDENT_AMBULATORY_CARE_PROVIDER_SITE_OTHER): Payer: Self-pay | Admitting: General Surgery

## 2013-10-01 VITALS — BP 132/78 | HR 68 | Temp 97.8°F | Resp 14 | Ht 64.0 in | Wt 253.4 lb

## 2013-10-01 DIAGNOSIS — Z09 Encounter for follow-up examination after completed treatment for conditions other than malignant neoplasm: Secondary | ICD-10-CM | POA: Insufficient documentation

## 2013-10-01 NOTE — Progress Notes (Signed)
The patient comes in today having redeveloped a seroma at the incision site from a previous left wire localization breast biopsy. Approximately 3 days ago she had 55 cc of serosanguineous fluid drained from the same site. Over a short period of time the fluid has reaccumulated.  The patient comes in asymptomatic however because the balloon out over a short period of time she wants to have it reevaluated. On examination there is a ballotable medial aspect of the left breast with no evidence of erythema, skin changes, or infection. It is completely nontender. The incision site is well-healed and there is no drainage from the breast at all.  My recommendation to the patient was to not drain the origination of the stroma and then likely would reaccumulate and that over time these tend to go away as the body reabsorbs the fluid. She should continue to wear her brassiere as she normally has, and keep her appointment to see her primary surgeon next Tuesday. She does not require any further pain medication.

## 2013-10-02 ENCOUNTER — Encounter: Payer: Self-pay | Admitting: Internal Medicine

## 2013-10-02 DIAGNOSIS — D126 Benign neoplasm of colon, unspecified: Secondary | ICD-10-CM | POA: Insufficient documentation

## 2013-10-05 ENCOUNTER — Encounter: Payer: Self-pay | Admitting: Gastroenterology

## 2013-10-06 ENCOUNTER — Encounter (INDEPENDENT_AMBULATORY_CARE_PROVIDER_SITE_OTHER): Payer: Self-pay | Admitting: Surgery

## 2013-10-06 ENCOUNTER — Ambulatory Visit (INDEPENDENT_AMBULATORY_CARE_PROVIDER_SITE_OTHER): Payer: BC Managed Care – PPO | Admitting: Surgery

## 2013-10-06 VITALS — BP 110/80 | HR 88 | Temp 99.2°F | Resp 14 | Ht 64.0 in | Wt 253.0 lb

## 2013-10-06 DIAGNOSIS — IMO0001 Reserved for inherently not codable concepts without codable children: Secondary | ICD-10-CM

## 2013-10-06 DIAGNOSIS — Z5189 Encounter for other specified aftercare: Secondary | ICD-10-CM

## 2013-10-06 NOTE — Progress Notes (Signed)
Recurrence of seroma in left breast. We prepped with alcohol and anesthetized with 1% lidocaine. We aspirated 120 cc of clear straw-colored fluid. There was complete resolution of the seroma. We'll recheck her in one week. She will call us sooner if her reaccumulate. Otherwise the incision looks great with no sign of infection.  Wilmon Arms. Corliss Skains, MD, [facility]  General/ Trauma Surgery  10/06/2013 10:53 AM

## 2013-10-06 NOTE — Patient Instructions (Addendum)
Call [phone] and ask for [place] if the fluid reaccumulates this Friday.

## 2013-10-13 ENCOUNTER — Ambulatory Visit (INDEPENDENT_AMBULATORY_CARE_PROVIDER_SITE_OTHER): Payer: BC Managed Care – PPO | Admitting: Surgery

## 2013-10-13 ENCOUNTER — Encounter (INDEPENDENT_AMBULATORY_CARE_PROVIDER_SITE_OTHER): Payer: Self-pay | Admitting: Surgery

## 2013-10-13 VITALS — BP 120/74 | HR 68 | Temp 98.3°F | Resp 16 | Ht 64.0 in | Wt 256.4 lb

## 2013-10-13 DIAGNOSIS — D249 Benign neoplasm of unspecified breast: Secondary | ICD-10-CM

## 2013-10-13 DIAGNOSIS — D242 Benign neoplasm of left breast: Secondary | ICD-10-CM

## 2013-10-13 DIAGNOSIS — Z5189 Encounter for other specified aftercare: Secondary | ICD-10-CM

## 2013-10-13 DIAGNOSIS — IMO0001 Reserved for inherently not codable concepts without codable children: Secondary | ICD-10-CM

## 2013-10-13 NOTE — Progress Notes (Signed)
Followup of her left breast seroma. It has reaccumulated slightly. It is still smaller than previously. We prepped with alcohol and aspirated 45 cc of serosanguineous fluid. No sign of infection. The wound is completely healed. She may followup with [place] as needed if the seroma recurs.    Continue annual mammograms.  Wilmon Arms. Corliss Skains, MD, [facility] Surgery  General/ Trauma Surgery  10/13/2013 9:27 AM

## 2013-10-14 ENCOUNTER — Ambulatory Visit (INDEPENDENT_AMBULATORY_CARE_PROVIDER_SITE_OTHER): Payer: BC Managed Care – PPO

## 2013-10-14 DIAGNOSIS — Z23 Encounter for immunization: Secondary | ICD-10-CM

## 2013-11-02 ENCOUNTER — Ambulatory Visit
Admission: RE | Admit: 2013-11-02 | Discharge: 2013-11-02 | Disposition: A | Discharge status: Home / Self Care | Payer: BC Managed Care – PPO | Source: Ambulatory Visit | Attending: Internal Medicine | Admitting: Internal Medicine

## 2013-11-02 DIAGNOSIS — N6320 Unspecified lump in the left breast, unspecified quadrant: Secondary | ICD-10-CM

## 2013-11-04 ENCOUNTER — Encounter: Payer: Self-pay | Admitting: Internal Medicine

## 2013-11-04 ENCOUNTER — Ambulatory Visit (INDEPENDENT_AMBULATORY_CARE_PROVIDER_SITE_OTHER): Payer: BC Managed Care – PPO | Admitting: Internal Medicine

## 2013-11-04 DIAGNOSIS — R030 Elevated blood-pressure reading, without diagnosis of hypertension: Secondary | ICD-10-CM

## 2013-11-04 NOTE — Progress Notes (Signed)
Case discussed with Dr. Kollar at the time of the visit.  We reviewed the resident's history and exam and pertinent patient test results.  I agree with the assessment, diagnosis, and plan of care documented in the resident's note.     

## 2013-11-04 NOTE — Patient Instructions (Signed)
We think that your blood pressure was elevated due to increased salt and exercise but do not think that you have high blood pressure. Good job on exercising and combining this with diet will help you to work on your weight loss goals.   Come back as previously scheduled and call with questions or problems at [phone].  1500 Calorie Diet The 1500 calorie diet limits calories to 1500 each day. Following this diet and making healthy meal choices can help improve overall health. It controls blood glucose (sugar) levels and can also help lower blood pressure and cholesterol.  SERVING SIZES Measuring foods and serving sizes helps to make sure you are getting the right amount of food. The list below tells how big or small some common serving sizes are.  1 oz.........4 stacked dice.  3 oz........Marland KitchenDeck of cards.  1 tsp.......Marland KitchenTip of little finger.  1 tbs......Marland KitchenMarland KitchenThumb.  2 tbs.......Marland KitchenGolf ball.   cup......Marland KitchenHalf of a fist.  1 cup.......Marland KitchenA fist. GUIDELINES FOR CHOOSING FOODS The goal of this diet is to eat a variety of foods and limit calories to 1500 each day. This can be done by choosing foods that are low in calories and fat. The diet also suggests eating small amounts of food frequently. Doing this helps control your blood glucose levels, so they do not get too high or too low. Each meal or snack may include a protein food source to help you feel more satisfied. Try to eat about the same amount of food around the same time each day. This includes weekend days, travel days, and days off work. Space your meals about 4 to 5 hours apart, and add a snack between them, if you wish.  For example, a daily food plan could include breakfast, a morning snack, lunch, dinner, and an evening snack. Healthy meals and snacks have different types of foods, including whole grains, vegetables, fruits, lean meats, poultry, fish, and dairy products. As you plan your meals, select a variety of foods. Choose from the  bread and starch, vegetable, fruit, dairy, and meat/protein groups. Examples of foods from each group are listed below, with their suggested serving sizes. Use measuring cups and spoons to become familiar with what a healthy portion looks like. Bread and Starch Each serving equals 15 grams of carbohydrate.  1 slice bread.   bagel.   cup cold cereal (unsweetened).   cup hot cereal or mashed potatoes.  1 small potato (size of a computer mouse).   cup cooked pasta or rice.   English muffin.  1 cup broth-based soup.  3 cups of popcorn.  4 to 6 whole-wheat crackers.   cup cooked beans, peas, or corn. Vegetables Each serving equals 5 grams of carbohydrate.   cup cooked vegetables.  1 cup raw vegetables.   cup tomato or vegetable juice. Fruit Each serving equals 15 grams of carbohydrate.  1 small apple or orange.  1  cup watermelon or strawberries.   cup applesauce (no sugar added).  2 tbs raisins.   banana.   cup canned fruit, packed in water or in its own juice.   cup unsweetened fruit juice. Dairy Each serving equals 12 to 15 grams of carbohydrate.  1 cup fat-free milk.  6 oz artificially sweetened yogurt or plain yogurt.  1 cup low-fat buttermilk.  1 cup soy milk.  1 cup almond milk. Meat/Protein  1 large egg.  2 to 3 oz meat, poultry, or fish.   cup low-fat cottage cheese.  1 tbs peanut butter.  1 oz low-fat cheese.   cup tuna, packed in water.   cup tofu. Fat  1 tsp oil.  1 tsp trans-fat-free margarine.  1 tsp butter.  1 tsp mayonnaise.  2 tbs avocado.  1 tbs salad dressing.  1 tbs cream cheese.  2 tbs sour cream. SAMPLE 1500 CALORIE DIET PLAN Breakfast   whole-wheat English muffin (1 carb serving).  1 tsp trans-fat-free margarine.  1 scrambled egg.  1 cup fat-free milk (1 carb serving).  1 small orange (1 carb serving). Lunch  Chicken wrap.  1 whole-wheat tortilla, 8-inch (1 carb  servings).  2 oz chicken breast, sliced.  2 tbs low-fat salad dressing, such as [place].   cup shredded lettuce.  2 slices tomato.   cup carrot sticks.  1 small apple (1 carb serving). Afternoon Snack  3 graham cracker squares (1 carb serving).  1 tbs peanut butter. Dinner  2 oz lean pork chop, broiled.  1 cup brown rice (3 carb servings).   cup steamed carrots.   cup green beans.  1 cup fat-free milk (1 carb serving).  1 tsp trans-fat-free margarine. Evening Snack   cup low-fat cottage cheese.  1 small peach or pear, sliced (or  cup canned in water) (1 carb serving). MEAL PLAN You can use this worksheet to help you make a daily meal plan based on the 1500 calorie diabetic diet suggestions. If you are using this plan to help you control your blood glucose, you may interchange carbohydrate containing foods (dairy, starches, and fruits). Select a variety of fresh foods of varying colors and flavors. The total amount of carbohydrate in your meals or snacks is more important than making sure you include all of the food groups every time you eat. You can choose from approximately this many of the following foods to build your day's meals:  6 Starches.  3 Vegetables.  2 Fruits.  2 Dairy.  4 to 6 oz Meat/Protein.  Up to 3 Fats. Your dietician can use this worksheet to help you decide how many servings and which types of foods are right for you. BREAKFAST Food Group and Servings / Food Choice Starch _________________________________________________________ Dairy __________________________________________________________ Fruit ___________________________________________________________ Meat/Protein____________________________________________________ Fat ____________________________________________________________ LUNCH Food Group and Servings / Food Choice  Starch _________________________________________________________ Meat/Protein  ___________________________________________________ Vegetables _____________________________________________________ Fruit __________________________________________________________ Dairy __________________________________________________________ Fat ____________________________________________________________ Aura Fey Food Group and Servings / Food Choice Dairy __________________________________________________________ Starch _________________________________________________________ Meat/Protein____________________________________________________ Zada Girt ___________________________________________________________ Laural Golden Food Group and Servings / Food Choice Starch _________________________________________________________ Meat/Protein ___________________________________________________ Dairy __________________________________________________________ Vegetable ______________________________________________________ Fruit ___________________________________________________________ Fat ____________________________________________________________ Lollie Sails Food Group and Servings / Food Choice Fruit ___________________________________________________________ Meat/Protein ____________________________________________________ Dairy __________________________________________________________ Starch __________________________________________________________ DAILY TOTALS Starches _________________________ Vegetables _______________________ Fruits ____________________________ Dairy ____________________________ Meat/Protein_____________________ Fats _____________________________ Document Released: 07/09/2005 Document Revised: 03/10/2012 Document Reviewed: 11/03/2009 [employer] Patient Information 2014 [employer].   Calorie Counting Diet A calorie counting diet requires you to eat the number of calories that are right for you in a day. Calories are the measurement of how much energy  you get from the food you eat. Eating the right amount of calories is important for staying at a healthy weight. If you eat too many calories, your body will store them as fat and you may gain weight. If you eat too few calories, you may lose weight. Counting the number of calories you eat during a day will help you know if you are eating the right amount. A Registered Dietitian can determine how many calories you need in a day. The  amount of calories needed varies from person to person. If your goal is to lose weight, you will need to eat fewer calories. Losing weight can benefit you if you are overweight or have health problems such as heart disease, high blood pressure, or diabetes. If your goal is to gain weight, you will need to eat more calories. Gaining weight may be necessary if you have a certain health problem that causes your body to need more energy. TIPS Whether you are increasing or decreasing the number of calories you eat during a day, it may be hard to get used to changes in what you eat and drink. The following are tips to help you keep track of the number of calories you eat.  Measure foods at home with measuring cups. This helps you know the amount of food and number of calories you are eating.  Restaurants often serve food in amounts that are larger than 1 serving. While eating out, estimate how many servings of a food you are given. For example, a serving of cooked rice is  cup or about the size of half of a fist. Knowing serving sizes will help you be aware of how much food you are eating at restaurants.  Ask for smaller portion sizes or child-size portions at restaurants.  Plan to eat half of a meal at a restaurant. Take the rest home or share the other half with a friend.  Read the Nutrition Facts panel on food labels for calorie content and serving size. You can find out how many servings are in a package, the size of a serving, and the number of calories each serving  has.  For example, a package might contain 3 cookies. The Nutrition Facts panel on that package says that 1 serving is 1 cookie. Below that, it will say there are 3 servings in the container. The calories section of the Nutrition Facts label says there are 90 calories. This means there are 90 calories in 1 cookie (1 serving). If you eat 1 cookie you have eaten 90 calories. If you eat all 3 cookies, you have eaten 270 calories (3 servings x 90 calories = 270 calories). The list below tells you how big or small some common portion sizes are.  1 oz.........4 stacked dice.  3 oz........Marland KitchenDeck of cards.  1 tsp.......Marland KitchenTip of little finger.  1 tbs......Marland KitchenMarland KitchenThumb.  2 tbs.......Marland KitchenGolf ball.   cup......Marland KitchenHalf of a fist.  1 cup.......Marland KitchenA fist. KEEP A FOOD LOG Write down every food item you eat, the amount you eat, and the number of calories in each food you eat during the day. At the end of the day, you can add up the total number of calories you have eaten. It may help to keep a list like the one below. Find out the calorie information by reading the Nutrition Facts panel on food labels. Breakfast  Bran cereal (1 cup, 110 calories).  Fat-free milk ( cup, 45 calories). Snack  Apple (1 medium, 80 calories). Lunch  Spinach (1 cup, 20 calories).  Tomato ( medium, 20 calories).  Chicken breast strips (3 oz, 165 calories).  Shredded cheddar cheese ( cup, 110 calories).  Light [place] dressing (2 tbs, 60 calories).  Whole-wheat bread (1 slice, 80 calories).  Tub margarine (1 tsp, 35 calories).  Vegetable soup (1 cup, 160 calories). Dinner  Pork chop (3 oz, 190 calories).  Brown rice (1 cup, 215 calories).  Steamed broccoli ( cup, 20 calories).  Strawberries (1  cup, 65 calories).  Whipped cream (1 tbs, 50 calories). Daily Calorie Total: 1425 Document Released: 12/17/2005 Document Revised: 03/10/2012 Document Reviewed: 06/13/2007 [facility] Patient Information 2014 [place],  [place].

## 2013-11-04 NOTE — Progress Notes (Signed)
Subjective:     Patient ID: Tina Stone, female   DOB: 1950/12/29, 63 y.o.   MRN: [id number]  HPI The patient is a 63 YO female who is coming in today for an acute visit for some high BP after exercising. She states that she was eating a lot of extra salty foods this weekend and was not drinking enough water today. When she was done exercising (she does water stuff for 30 minutes then jogging for 15 minutes) her BP was 163/103. She then got concerned and came into the clinic. She feels well otherwise. She is not a smoker and takes no medications. She has been trying to lose weight and has increased her exercise to 4 days a week however has not changed her diet significantly. She does not have palpitations with exercise and no chest tightness or discomfort.   Review of Systems  Constitutional: Positive for activity change. Negative for fever, chills, diaphoresis, appetite change and fatigue.       Exercising more  HENT: Negative.   Eyes: Negative.   Respiratory: Negative for cough, chest tightness, shortness of breath and wheezing.   Cardiovascular: Negative for chest pain, palpitations and leg swelling.  Gastrointestinal: Negative for nausea, vomiting, abdominal pain, diarrhea and constipation.  Endocrine: Negative.   Musculoskeletal: Negative.   Skin: Negative.   Neurological: Negative.  Negative for dizziness, tremors, syncope, weakness, numbness and headaches.       Objective:   Physical Exam  Constitutional: She is oriented to person, place, and time. She appears well-developed and well-nourished. No distress.  HENT:  Head: Normocephalic and atraumatic.  Eyes: EOM are normal. Pupils are equal, round, and reactive to light.  Neck: Normal range of motion. Neck supple.  Cardiovascular: Normal rate and regular rhythm.   No murmur heard. Pulmonary/Chest: Effort normal and breath sounds normal. No respiratory distress. She has no wheezes. She has no rales. She exhibits no tenderness.   Abdominal: Soft. Bowel sounds are normal. She exhibits no distension. There is no tenderness. There is no rebound and no guarding.  Musculoskeletal: Normal range of motion. She exhibits no edema and no tenderness.  Neurological: She is alert and oriented to person, place, and time. No cranial nerve deficit.  Skin: Skin is warm and dry. She is not diaphoretic.  Psychiatric: She has a normal mood and affect.       Assessment/Plan:   1. Acute High BP - initial BP 153/96 and recheck after sitting was 126/84. Will not start on BP medication today as she has not had high BP before. She is already scheduled visit right before Thanksgiving with her PCP and will have her keep appointment at that time and if BP high may need to start medication (if she will agree, she seems as though she does not like to take medications).   2. Disposition - Patient has already had flu shot and no other health maintenance concerns addressed today. She will come back as previously scheduled with PCP.

## 2013-11-14 ENCOUNTER — Emergency Department (HOSPITAL_COMMUNITY)
Admission: EM | Admit: 2013-11-14 | Discharge: 2013-11-14 | Disposition: A | Discharge status: Home / Self Care | Payer: BC Managed Care – PPO | Attending: Emergency Medicine | Admitting: Emergency Medicine

## 2013-11-14 ENCOUNTER — Encounter (HOSPITAL_COMMUNITY): Payer: Self-pay | Admitting: Emergency Medicine

## 2013-11-14 DIAGNOSIS — R1084 Generalized abdominal pain: Secondary | ICD-10-CM | POA: Insufficient documentation

## 2013-11-14 DIAGNOSIS — R197 Diarrhea, unspecified: Secondary | ICD-10-CM | POA: Insufficient documentation

## 2013-11-14 DIAGNOSIS — Z853 Personal history of malignant neoplasm of breast: Secondary | ICD-10-CM | POA: Insufficient documentation

## 2013-11-14 DIAGNOSIS — R110 Nausea: Secondary | ICD-10-CM | POA: Insufficient documentation

## 2013-11-14 DIAGNOSIS — Z8719 Personal history of other diseases of the digestive system: Secondary | ICD-10-CM | POA: Insufficient documentation

## 2013-11-14 DIAGNOSIS — Z8639 Personal history of other endocrine, nutritional and metabolic disease: Secondary | ICD-10-CM | POA: Insufficient documentation

## 2013-11-14 DIAGNOSIS — Z862 Personal history of diseases of the blood and blood-forming organs and certain disorders involving the immune mechanism: Secondary | ICD-10-CM | POA: Insufficient documentation

## 2013-11-14 LAB — URINE MICROSCOPIC-ADD ON

## 2013-11-14 LAB — COMPREHENSIVE METABOLIC PANEL
ALT: 11 U/L (ref 0–35)
AST: 16 U/L (ref 0–37)
Albumin: 3.5 g/dL (ref 3.5–5.2)
Alkaline Phosphatase: 102 U/L (ref 39–117)
BUN: 9 mg/dL (ref 6–23)
CO2: 23 mEq/L (ref 19–32)
Calcium: 8.9 mg/dL (ref 8.4–10.5)
Chloride: 102 mEq/L (ref 96–112)
Creatinine, Ser: 0.89 mg/dL (ref 0.50–1.10)
GFR calc Af Amer: 80 mL/min — ABNORMAL LOW (ref >90)
GFR calc non Af Amer: 69 mL/min — ABNORMAL LOW (ref >90)
Glucose, Bld: 96 mg/dL (ref 70–99)
Potassium: 3.6 mEq/L (ref 3.5–5.1)
Sodium: 136 mEq/L (ref 135–145)
Total Bilirubin: 0.3 mg/dL (ref 0.3–1.2)
Total Protein: 7.3 g/dL (ref 6.0–8.3)

## 2013-11-14 LAB — CBC WITH DIFFERENTIAL/PLATELET
Basophils Absolute: 0 10*3/uL (ref 0.0–0.1)
Basophils Relative: 0 % (ref 0–1)
Eosinophils Absolute: 0.1 10*3/uL (ref 0.0–0.7)
Eosinophils Relative: 2 % (ref 0–5)
HCT: 36.8 % (ref 36.0–46.0)
Hemoglobin: 12.1 g/dL (ref 12.0–15.0)
Lymphocytes Relative: 33 % (ref 12–46)
Lymphs Abs: 1.1 10*3/uL (ref 0.7–4.0)
MCH: 24.3 pg — ABNORMAL LOW (ref 26.0–34.0)
MCHC: 32.9 g/dL (ref 30.0–36.0)
MCV: 74 fL — ABNORMAL LOW (ref 78.0–100.0)
Monocytes Absolute: 0.3 10*3/uL (ref 0.1–1.0)
Monocytes Relative: 8 % (ref 3–12)
Neutro Abs: 1.8 10*3/uL (ref 1.7–7.7)
Neutrophils Relative %: 57 % (ref 43–77)
Platelets: 214 10*3/uL (ref 150–400)
RBC: 4.97 MIL/uL (ref 3.87–5.11)
RDW: 16.2 % — ABNORMAL HIGH (ref 11.5–15.5)
WBC: 3.2 10*3/uL — ABNORMAL LOW (ref 4.0–10.5)

## 2013-11-14 LAB — URINALYSIS, ROUTINE W REFLEX MICROSCOPIC
Bilirubin Urine: NEGATIVE
Glucose, UA: NEGATIVE mg/dL
Ketones, ur: NEGATIVE mg/dL
Nitrite: NEGATIVE
Protein, ur: NEGATIVE mg/dL
Specific Gravity, Urine: 1.022 (ref 1.005–1.030)
Urobilinogen, UA: 0.2 mg/dL (ref 0.0–1.0)
pH: 5.5 (ref 5.0–8.0)

## 2013-11-14 LAB — LIPASE, BLOOD: Lipase: 30 U/L (ref 11–59)

## 2013-11-14 MED ORDER — ONDANSETRON HCL 4 MG PO TABS
4.0000 mg | ORAL_TABLET | Freq: Once | ORAL | Status: AC
  Administered 2013-11-14: 4 mg via ORAL
  Filled 2013-11-14: qty 1

## 2013-11-14 MED ORDER — OMEPRAZOLE 20 MG PO CPDR: 20.0000 mg | DELAYED_RELEASE_CAPSULE | Freq: Two times a day (BID) | ORAL | Status: DC

## 2013-11-14 NOTE — ED Notes (Signed)
Pt c/o gen abd cramping and diarrhea since eating a piece of fish on Thursday.

## 2013-11-14 NOTE — ED Provider Notes (Signed)
CSN: [id number]     Arrival date & time 11/14/13  4098 History   First MD Initiated Contact with Patient 11/14/13 [phone]     Chief Complaint  Patient presents with  . Abdominal Pain  . Diarrhea   (Consider location/radiation/quality/duration/timing/severity/associated sxs/prior Treatment) Patient is a 63 y.o. female presenting with abdominal pain and diarrhea. The history is provided by the patient. No language interpreter was used.  Abdominal Pain Pain location:  Generalized Pain quality: cramping   Pain radiates to:  Does not radiate Pain severity:  Mild Timing:  Intermittent Progression:  Waxing and waning Associated symptoms: diarrhea and nausea   Associated symptoms: no chills, no dysuria, no fever and no vomiting   Diarrhea:    Quality:  Mucous   Duration:  2 days Nausea:    Severity:  Moderate   Onset quality:  Gradual   Duration:  2 days Diarrhea Associated symptoms: abdominal pain   Associated symptoms: no chills, no fever and no vomiting   pt is a 63 year old female who presents with a 2-day history of crampy, diarrhea for the last two days after eating some fish Thursday evening. She reports that the diarrhea started approx to 1 hour after she ate some fish. She reports that he diarrhea is foul smelling and loose. She also reports that she has been having nausea. She reports a periodic cramping that waxes and wanes over the last couple days and slight abdominal tenderness. She denies fever, chills, vomiting or bloody stools. She thinks that it is probably the fish that she ate a couple days ago and has started to have a gradual slowing of diarrhea and started to feel a little better.     Past Medical History  Diagnosis Date  . HLD (hyperlipidemia)   . GERD (gastroesophageal reflux disease)   . Cholelithiasis   . Allergy   . Cancer     breast   Past Surgical History  Procedure Laterality Date  . Ankle fracture surgery  1966    right  . Breast lumpectomy  with needle localization Left 09/09/2013    Procedure: BREAST LUMPECTOMY WITH NEEDLE LOCALIZATION;  Surgeon: Wilmon Arms. Corliss Skains, MD;  Location: [facility] OR;  Service: General;  Laterality: Left;   Family History  Problem Relation Age of Onset  . Diabetes Mother   . Hypertension Mother   . Colon polyps Mother 44  . Diabetes Father   . Pancreatic cancer Maternal Aunt   . Colon cancer Neg Hx    History  Substance Use Topics  . Smoking status: Never Smoker   . Smokeless tobacco: Never Used  . Alcohol Use: No   OB History   Grav Para Term Preterm Abortions TAB SAB Ect Mult Living                 Review of Systems  Constitutional: Negative for fever and chills.  Gastrointestinal: Positive for nausea, abdominal pain and diarrhea. Negative for vomiting.  Genitourinary: Negative for dysuria.  Neurological: Negative for weakness.  All other systems reviewed and are negative.    Allergies  Shellfish allergy  Home Medications   Current Outpatient Rx  Name  Route  Sig  Dispense  Refill  . APPLE CIDER VINEGAR PO   Oral   Take by mouth.          BP 154/91  Pulse 85  Temp(Src) 97.2 F (36.2 C) (Oral)  Resp 16  SpO2 100% Physical Exam  Nursing note and vitals  reviewed. Constitutional: She is oriented to person, place, and time. She appears well-developed and well-nourished. No distress.  Well-appearing  HENT:  Head: Normocephalic.  Mouth/Throat: Oropharynx is clear and moist.  Eyes: EOM are normal.  Neck: Normal range of motion. Neck supple. No JVD present. No tracheal deviation present. No thyromegaly present.  Cardiovascular: Normal rate, regular rhythm and normal heart sounds.   Pulmonary/Chest: Effort normal and breath sounds normal. No respiratory distress. She has no wheezes.  Abdominal: Soft. Bowel sounds are normal. She exhibits no distension. There is no hepatosplenomegaly. There is generalized tenderness. There is no rigidity, no rebound, no guarding, no tenderness at  McBurney's point and negative Murphy's sign.  Musculoskeletal: Normal range of motion.  Lymphadenopathy:    She has no cervical adenopathy.  Neurological: She is alert and oriented to person, place, and time.  Skin: Skin is warm and dry.  Psychiatric: She has a normal mood and affect. Her behavior is normal. Judgment and thought content normal.    ED Course  Procedures (including critical care time) Labs Review Labs Reviewed  COMPREHENSIVE METABOLIC PANEL  CBC WITH DIFFERENTIAL  LIPASE, BLOOD  URINALYSIS, ROUTINE W REFLEX MICROSCOPIC   Imaging Review No results found.  EKG Interpretation   None       MDM   1. Diarrhea     History of diarrhea for 2 days, started abruptly after eating some "bad fish". Abdominal exam reassuring. Generalized tenderness and periodic cramps. No peritoneal signs. No fever, chills or vomiting. No exposure to recent illness. Non-toxic appearing. Labs unremarkable. Increase oral fluids and advance diet as tolerated.       Irish Elders, NP 11/14/13 1030

## 2013-11-15 LAB — URINE CULTURE

## 2013-11-15 NOTE — ED Provider Notes (Signed)
Medical screening examination/treatment/procedure(s) were performed by non-physician practitioner and as supervising physician I was immediately available for consultation/collaboration.  EKG Interpretation   None        Raeford Razor, MD 11/15/13 [phone]

## 2013-11-25 ENCOUNTER — Ambulatory Visit (INDEPENDENT_AMBULATORY_CARE_PROVIDER_SITE_OTHER): Payer: BC Managed Care – PPO | Admitting: Internal Medicine

## 2013-11-25 ENCOUNTER — Encounter: Payer: Self-pay | Admitting: Internal Medicine

## 2013-11-25 VITALS — BP 132/91 | HR 76 | Temp 98.2°F | Wt 256.6 lb

## 2013-11-25 DIAGNOSIS — E785 Hyperlipidemia, unspecified: Secondary | ICD-10-CM

## 2013-11-25 DIAGNOSIS — K219 Gastro-esophageal reflux disease without esophagitis: Secondary | ICD-10-CM

## 2013-11-25 DIAGNOSIS — D563 Thalassemia minor: Secondary | ICD-10-CM | POA: Insufficient documentation

## 2013-11-25 DIAGNOSIS — D126 Benign neoplasm of colon, unspecified: Secondary | ICD-10-CM

## 2013-11-25 DIAGNOSIS — R718 Other abnormality of red blood cells: Secondary | ICD-10-CM

## 2013-11-25 DIAGNOSIS — D242 Benign neoplasm of left breast: Secondary | ICD-10-CM

## 2013-11-25 DIAGNOSIS — R110 Nausea: Secondary | ICD-10-CM | POA: Insufficient documentation

## 2013-11-25 HISTORY — DX: Thalassemia minor: D56.3

## 2013-11-25 MED ORDER — OMEPRAZOLE 20 MG PO CPDR: 20.0000 mg | DELAYED_RELEASE_CAPSULE | Freq: Every day | ORAL | Status: DC

## 2013-11-25 NOTE — Assessment & Plan Note (Signed)
CBC:    Component Value Date/Time   WBC 3.2* 11/14/2013 0929   HGB 12.1 11/14/2013 0929   HCT 36.8 11/14/2013 0929   PLT 214 11/14/2013 0929   MCV 74.0* 11/14/2013 0929   NEUTROABS 1.8 11/14/2013 0929   LYMPHSABS 1.1 11/14/2013 0929   MONOABS 0.3 11/14/2013 0929   EOSABS 0.1 11/14/2013 0929   EOSABS 0.1 K/UL 11/08/2006 1431   BASOSABS 0.0 11/14/2013 0929    Assessment: A recent CBC done 11/14/2013 showed a normal hemoglobin of 12.1, with a low MCV of 74 and a mildly low WBC of 3.2.  Patient has had low MCV values in the past.  She reports a history of uterine fibroids with heavy menstrual bleeding when she was premenopausal.  She denies any current or recent vaginal bleeding.  Her microcytosis is likely due to iron deficiency.  Plan: Will obtain a CBC with differential, ferritin, serum iron, and TIBC when patient returns for labs next week.

## 2013-11-25 NOTE — Assessment & Plan Note (Signed)
Assessment: Patient reports mild episodes of nausea that occur after eating, last for 5-10 minutes, and then resolve.  These may be due to the apple cider vinegar that she has been taking before meals, and I advised her to stop taking the vinegar.  She has a history of GERD, and she stopped her omeprazole on her own several months ago.  It is possible that her symptoms relate to her GERD.  Plan: I advised patient to stop taking apple supper vinegar with meals; the plan is to resume omeprazole 20 mg once daily.

## 2013-11-25 NOTE — Assessment & Plan Note (Signed)
Assessment: Patient underwent left needle-localized lumpectomy by Dr. Wilmon Arms. [place] on 09/09/2013; pathology showed intraductal papilloma with no atypia or malignancy identified.  Plan: Continue annual mammography screening.

## 2013-11-25 NOTE — Patient Instructions (Signed)
Please stop taking the apple cider vinegar. Start omeprazole (Prilosec) 20 mg once a day. Please return next week for fasting lab work.

## 2013-11-25 NOTE — Assessment & Plan Note (Signed)
Assessment: Patient had a colon polyp removed on screening colonoscopy by Dr. Melvia Heaps 09/28/2013; pathology showed a tubular adenoma.  Plan: Dr. Arlyce Dice recommends followup colonoscopy in 5 years.

## 2013-11-25 NOTE — Assessment & Plan Note (Signed)
Assessment: Patient has been off of omeprazole for several months; she stopped that medication on her own.  Her recent episodes of nausea after eating might be related to GERD.  Plan: Resume omeprazole 20 mg daily.

## 2013-11-25 NOTE — Assessment & Plan Note (Signed)
Lipids:    Component Value Date/Time   CHOL 245* 03/10/2013 1444   TRIG 75 03/10/2013 1444   HDL 62 03/10/2013 1444   LDLCALC 168* 03/10/2013 1444   VLDL 15 03/10/2013 1444   CHOLHDL 4.0 03/10/2013 1444    Assessment: Patient stopped taking pravastatin on her own a few months ago, because of reported muscle aches that she felt might have been a side effect of the pravastatin.  Plan: Patient will return next week for a fasting lipid panel; will then treat based upon that result.  I discussed with her the option of using a different statin at a low dose with close attention for any possible side effects, if indicated by the lipid panel.

## 2013-11-25 NOTE — Progress Notes (Signed)
  Subjective:    Patient ID: Tina Stone, female    DOB: 03-28-1950, 63 y.o.   MRN: [id number]  HPI This is the first visit to my clinic for Tina Stone, a 63 year old woman with GERD, hyperlipidemia, and other problems as outlined in the problem list and medical history who was previously followed by Dr. Heloise Beecham in our clinic.  Patient reports that she has been doing well; her only complaint today is some episodes of mild nausea which occur after eating, last for 5-10 minutes, and then resolve.  Patient has been taking a teaspoon of apple cider vinegar before meals, and she raised the question of whether that might be provoking her nausea.  She was previously on omeprazole for GERD, but reports that she has not taken the omeprazole for several months.  Patient was seen in the emergency department on 11/14/2013 with complaint of diarrhea; she says that the diarrhea has completely resolved.  She has been prescribed pravastatin for hypercholesterolemia, but discontinued that medication on her own about 3 months ago; she reports that she stopped the pravastatin because of some muscle aches which she thought might have been related to the medication.  She has no other complaints today.  Current medications, past medical history, past surgical history, family history, and social history were reviewed and updated.  Review of Systems  Constitutional: Negative for fever, chills, diaphoresis, appetite change and unexpected weight change.  HENT: Negative for ear discharge, ear pain, hearing loss and sinus pressure.   Eyes: Negative for pain and visual disturbance.  Respiratory: Negative for cough, chest tightness, shortness of breath and wheezing.   Cardiovascular: Negative for chest pain, palpitations and leg swelling.  Gastrointestinal: Positive for nausea (Mild nausea after eating for about 2 months, 2/10, lasts 5-10 minutes then goes away). Negative for vomiting, abdominal pain, diarrhea, constipation and  blood in stool.  Endocrine: Negative for polydipsia, polyphagia and polyuria.  Genitourinary: Negative for dysuria, frequency, vaginal bleeding, vaginal discharge, difficulty urinating and pelvic pain.  Musculoskeletal: Negative for arthralgias and myalgias.  Skin: Negative for rash.  Neurological: Negative for dizziness, seizures, syncope, weakness, light-headedness, numbness and headaches.       Objective:   Physical Exam  Constitutional: No distress.  HENT:  Head: Normocephalic and atraumatic.  Neck: Normal range of motion. Neck supple. No thyromegaly present.  Cardiovascular: Normal rate, regular rhythm and normal heart sounds.  Exam reveals no gallop and no friction rub.   No murmur heard. Pulmonary/Chest: Effort normal and breath sounds normal. No respiratory distress. She has no wheezes. She has no rales.  Abdominal: Soft. Bowel sounds are normal. She exhibits no distension. There is no hepatosplenomegaly. There is no tenderness. There is no rebound and no guarding.  Musculoskeletal: She exhibits no edema.  Lymphadenopathy:    She has no cervical adenopathy.       Assessment & Plan:

## 2013-12-02 ENCOUNTER — Other Ambulatory Visit (INDEPENDENT_AMBULATORY_CARE_PROVIDER_SITE_OTHER): Payer: BC Managed Care – PPO

## 2013-12-02 DIAGNOSIS — E785 Hyperlipidemia, unspecified: Secondary | ICD-10-CM

## 2013-12-02 DIAGNOSIS — R110 Nausea: Secondary | ICD-10-CM

## 2013-12-02 DIAGNOSIS — R718 Other abnormality of red blood cells: Secondary | ICD-10-CM

## 2013-12-02 LAB — IRON AND TIBC
%SAT: 22 % (ref 20–55)
TIBC: 314 ug/dL (ref 250–470)

## 2013-12-02 LAB — LIPID PANEL
Cholesterol: 263 mg/dL — ABNORMAL HIGH (ref 0–200)
HDL: 61 mg/dL (ref >39)
LDL Cholesterol: 186 mg/dL — ABNORMAL HIGH (ref 0–99)
Triglycerides: 80 mg/dL (ref <150)
VLDL: 16 mg/dL (ref 0–40)

## 2013-12-02 LAB — CBC WITH DIFFERENTIAL/PLATELET
Basophils Absolute: 0 10*3/uL (ref 0.0–0.1)
Basophils Relative: 0 % (ref 0–1)
Eosinophils Absolute: 0.1 10*3/uL (ref 0.0–0.7)
HCT: 38.5 % (ref 36.0–46.0)
Lymphocytes Relative: 40 % (ref 12–46)
MCH: 24.3 pg — ABNORMAL LOW (ref 26.0–34.0)
MCHC: 32.5 g/dL (ref 30.0–36.0)
MCV: 74.8 fL — ABNORMAL LOW (ref 78.0–100.0)
Monocytes Absolute: 0.3 10*3/uL (ref 0.1–1.0)
Monocytes Relative: 6 % (ref 3–12)
Neutro Abs: 2.3 10*3/uL (ref 1.7–7.7)
RDW: 17.3 % — ABNORMAL HIGH (ref 11.5–15.5)

## 2013-12-03 ENCOUNTER — Telehealth: Payer: Self-pay | Admitting: Internal Medicine

## 2013-12-03 DIAGNOSIS — E785 Hyperlipidemia, unspecified: Secondary | ICD-10-CM

## 2013-12-03 LAB — URINALYSIS, ROUTINE W REFLEX MICROSCOPIC
Bilirubin Urine: NEGATIVE
Glucose, UA: NEGATIVE mg/dL
Hgb urine dipstick: NEGATIVE
Ketones, ur: NEGATIVE mg/dL
Leukocytes, UA: NEGATIVE
Protein, ur: NEGATIVE mg/dL
pH: 8 (ref 5.0–8.0)

## 2013-12-03 NOTE — Telephone Encounter (Signed)
Lipids:    Component Value Date/Time   CHOL 263* 12/02/2013 0925   TRIG 80 12/02/2013 0925   HDL 61 12/02/2013 0925   LDLCALC 186* 12/02/2013 0925   VLDL 16 12/02/2013 0925   CHOLHDL 4.3 12/02/2013 0925   I called patient and discussed her lipid panel results with her.  According to current AHA guidelines for lipid management, her 10 year risk is estimated at 6.8%, and the guideline recommendation is to discuss risks and benefits of statin therapy with the patient and consider moderate intensity statin therapy.  According to the ATP III calculator, her 10 year risk is 3%, and the recommendation is also to consider statin therapy.  I discussed this at length with patient, and discussed the alternative of therapeutic lifestyle changes including exercise and low-fat diet, or starting a low dose of a statin other than pravastatin with close attention to any potential side effects.  Patient prefers at this time to try therapeutic lifestyle changes but not to start an alternative statin at this time.  I think this is reasonable plan, and we will recheck a lipid panel in about 6 months.

## 2013-12-03 NOTE — Assessment & Plan Note (Signed)
Telephone Contact Note  Lipids:    Component Value Date/Time   CHOL 263* 12/02/2013 0925   TRIG 80 12/02/2013 0925   HDL 61 12/02/2013 0925   LDLCALC 186* 12/02/2013 0925   VLDL 16 12/02/2013 0925   CHOLHDL 4.3 12/02/2013 0925    I called patient and discussed her lipid panel results with her.  According to current AHA guidelines for lipid management, her 10 year risk is estimated at 6.8%, and the guideline recommendation is to discuss risks and benefits of statin therapy with the patient and consider moderate intensity statin therapy.  According to the ATP III calculator, her 10 year risk is 3%, and the recommendation is also to consider statin therapy.  I discussed this at length with patient, and discussed the alternative of therapeutic lifestyle changes including exercise and low-fat diet, or starting a low dose of a statin other than pravastatin with close attention to any potential side effects.  Patient prefers at this time to try therapeutic lifestyle changes but not to start an alternative statin at this time.  I think this is reasonable plan, and we will recheck a lipid panel in about 6 months.

## 2013-12-15 ENCOUNTER — Encounter: Payer: Self-pay | Admitting: *Deleted

## 2014-01-14 ENCOUNTER — Ambulatory Visit: Payer: BC Managed Care – PPO | Admitting: Internal Medicine

## 2014-02-02 ENCOUNTER — Ambulatory Visit (INDEPENDENT_AMBULATORY_CARE_PROVIDER_SITE_OTHER): Payer: BC Managed Care – PPO | Admitting: Internal Medicine

## 2014-02-02 VITALS — BP 127/88 | HR 74 | Temp 97.1°F | Wt 255.2 lb

## 2014-02-02 DIAGNOSIS — R059 Cough, unspecified: Secondary | ICD-10-CM

## 2014-02-02 DIAGNOSIS — J309 Allergic rhinitis, unspecified: Secondary | ICD-10-CM

## 2014-02-02 DIAGNOSIS — R051 Acute cough: Secondary | ICD-10-CM | POA: Insufficient documentation

## 2014-02-02 DIAGNOSIS — J302 Other seasonal allergic rhinitis: Secondary | ICD-10-CM

## 2014-02-02 DIAGNOSIS — E785 Hyperlipidemia, unspecified: Secondary | ICD-10-CM

## 2014-02-02 DIAGNOSIS — R05 Cough: Secondary | ICD-10-CM | POA: Insufficient documentation

## 2014-02-02 DIAGNOSIS — G8918 Other acute postprocedural pain: Secondary | ICD-10-CM | POA: Insufficient documentation

## 2014-02-02 MED ORDER — FLUTICASONE PROPIONATE 50 MCG/ACT NA SUSP: 2.0000 | Freq: Every day | NASAL | Status: DC

## 2014-02-02 NOTE — Assessment & Plan Note (Signed)
Lipids:    Component Value Date/Time   CHOL 263* 12/02/2013 0925   TRIG 80 12/02/2013 0925   HDL 61 12/02/2013 0925   LDLCALC 186* 12/02/2013 0925   VLDL 16 12/02/2013 0925   CHOLHDL 4.3 12/02/2013 0925    Assessment: As previously noted, according to current AHA guidelines for lipid management, patient's 10 year risk is estimated at 6.8%, and the guideline recommendation is to discuss risks and benefits of statin therapy with the patient and consider moderate intensity statin therapy.  According to the ATP III calculator, her 10 year risk is 3%, and the recommendation is also to consider statin therapy.  I previously discussed this at length with patient, and I discussed the alternatives of therapeutic lifestyle changes including exercise and low-fat diet, or starting a low dose of a statin other than pravastatin with close attention to any potential side effects.  Patient decided to try therapeutic lifestyle changes.  We discussed this plan again today.  Plan: Therapeutic lifestyle changes; will recheck a lipid panel in about 4 months.

## 2014-02-02 NOTE — Patient Instructions (Signed)
Start fluticasone (Flonase) nasal spray 2 sprays in each nostril once a day for allergic rhinitis. Please call or return if your cough worsens or does not improve within 3 weeks.

## 2014-02-02 NOTE — Assessment & Plan Note (Signed)
Assessment: Patient reports intermittent brief episodes of mild pain at the site of a left breast biopsy.  Exam is unremarkable, and shows a well-healed surgical scar with no tenderness, induration, erythema, warmth, or palpable abnormality.  Plan: Plan is symptomatic treatment; I advised patient to schedule a visit with her surgeon if the pain persists or worsens.

## 2014-02-02 NOTE — Assessment & Plan Note (Signed)
Assessment: Patient complains of a nonproductive cough which has persisted for about one month, with associated symptoms of sinus drainage.  She is afebrile, and her lung exam is clear.  Her cough is likely due to postnasal drip; she has a history of allergic rhinitis but is currently on no medications.  Plan: Plan is to treat presumptively for allergic rhinitis with fluticasone nasal spray.  I advised her to call or return if her symptoms persist or worsen.

## 2014-02-02 NOTE — Assessment & Plan Note (Signed)
Assessment: Patient complains of a nonproductive cough which has persisted for about one month, with associated symptoms of sinus drainage.  She is afebrile, and her lung exam is clear.  Her cough is likely due to postnasal drip; she has a history of allergic rhinitis but is currently on no medications.  Plan: Plan is to treat presumptively for allergic rhinitis with fluticasone nasal spray.  I advised her to call or return if her symptoms persist or worsen.  

## 2014-02-02 NOTE — Progress Notes (Signed)
   Subjective:    Patient ID: Tina Stone, female    DOB: 10-May-1950, 64 y.o.   MRN: [id number]  HPI Patient returns for followup of her hyperlipidemia, GERD, and other chronic medical problems.  Today her main complaint is a persisting cough which began about one month ago with associated symptoms of congestion; the cough is nonproductive, and bothers her mainly in the late evening or early morning.  She reports that the cough is improving.  She does have sinus drainage, and thinks that may be triggering the cough; she denies sinus pain, sore throat, myalgias, fever, chills, sweats, shortness of breath, or chest pain.  Patient also complains of occasional brief pains at the healed site of a left breast biopsy; these do not occur on a regular or daily basis, and when the pain occurs it is mild (1 - 2 out of 10) and lasts only for a couple of minutes.  She has had no associated redness, swelling, or discharge, and no palpable abnormality.  She reports that her nausea, noted at the time of her last visit, has resolved.  She has not been taking omeprazole.   Review of Systems  Constitutional: Negative for fever, chills and diaphoresis.  Respiratory: Negative for shortness of breath.   Cardiovascular: Negative for chest pain and leg swelling.  Gastrointestinal: Negative for nausea, vomiting and abdominal pain.  Genitourinary: Negative for dysuria and difficulty urinating.       Objective:   Physical Exam  Constitutional: No distress.  HENT:  Head: Normocephalic and atraumatic.  Nose: Right sinus exhibits no maxillary sinus tenderness and no frontal sinus tenderness. Left sinus exhibits no maxillary sinus tenderness and no frontal sinus tenderness.  Mouth/Throat: Oropharynx is clear and moist. No oropharyngeal exudate.  Neck: Neck supple.  Cardiovascular: Normal rate, regular rhythm and normal heart sounds.  Exam reveals no gallop and no friction rub.   No murmur heard. Pulmonary/Chest:  Effort normal and breath sounds normal. No respiratory distress. She has no wheezes. She has no rales.    Abdominal: Soft. Bowel sounds are normal. She exhibits no distension. There is no tenderness. There is no guarding.  Musculoskeletal: She exhibits no edema.        Assessment & Plan:

## 2014-02-08 ENCOUNTER — Encounter: Payer: Self-pay | Admitting: Internal Medicine

## 2014-02-18 ENCOUNTER — Encounter: Payer: Self-pay | Admitting: Internal Medicine

## 2014-02-18 NOTE — Telephone Encounter (Signed)
Spoke to pt via ph, she is going to thoroughly read the email and decide what she feels best with and then call back

## 2014-02-18 NOTE — Telephone Encounter (Signed)
I am answering on your PCP, Dr. Marinda Elk behalf as he is away for the time being. I will try to answer your query to the best of my knowledge.   Adults need to be vaccinated for measles if they fall under high risk category. High risk category includes -   People who are mingling a lot, like college students.  People who do patient care like doctors, nurses etc.  International travelers to countries where measles is highly present.  People who might have children who can be infected around.  Most adults in [place] born before 1957 might have natural immunity to measles because it was highly present in [place] at that time. Also, if you have had measles as a kid, then you are immune.   If you really want to get the vaccine (as per the nature of your job and your discussion with Dr. Marinda Elk), then we can first check for your immunity status by doing a simple blood test. The test is expensive. If we find that you do not have much immunity, we can go ahead and vaccinate you. If you are severely allergic to gelatin, or neomycin, you will not be a candidate for the vaccine.

## 2014-02-24 ENCOUNTER — Ambulatory Visit: Payer: BC Managed Care – PPO | Admitting: Internal Medicine

## 2014-03-02 ENCOUNTER — Encounter: Payer: Self-pay | Admitting: Internal Medicine

## 2014-03-05 ENCOUNTER — Telehealth: Payer: Self-pay | Admitting: Internal Medicine

## 2014-03-05 NOTE — Telephone Encounter (Signed)
Patient sent a my chart message (see below) raising a concern about the billing she received for an office visit on 02/02/2014.  I called her Wednesday afternoon 03/03/2014 and discussed this with her.  I explained the coding for the level of visit and the problems addressed.  I encouraged her to follow up with [facility] regarding their review of the billing to make sure it was correct.  She seemed satisfied with the explanation.  ----- Message ----- From: Tina Stone Sent: 03/02/2014 9:37 AM To: [facility] Triage Nurse Pool Subject: Non-Urgent Medical Question Dear Dr Marinda Elk, I recently reviewed my [facility] insurance claims and found that my last visit with you was coded for billing of $122.00. I was under the impression that this outpatient was a routine preventive visit with a complaint of a cough. I called [facility] about this concern and it is under review. Please get back with me regarding this matter. Thanks. Tina Stone

## 2014-06-01 ENCOUNTER — Encounter: Payer: Self-pay | Admitting: *Deleted

## 2014-06-02 ENCOUNTER — Ambulatory Visit: Payer: BC Managed Care – PPO | Admitting: Internal Medicine

## 2014-06-09 ENCOUNTER — Encounter: Payer: Self-pay | Admitting: Internal Medicine

## 2014-06-09 ENCOUNTER — Ambulatory Visit (INDEPENDENT_AMBULATORY_CARE_PROVIDER_SITE_OTHER): Payer: BC Managed Care – PPO | Admitting: Internal Medicine

## 2014-06-09 VITALS — BP 130/80 | HR 70 | Temp 97.2°F | Ht 64.0 in | Wt 256.1 lb

## 2014-06-09 DIAGNOSIS — J302 Other seasonal allergic rhinitis: Secondary | ICD-10-CM

## 2014-06-09 DIAGNOSIS — Z0000 Encounter for general adult medical examination without abnormal findings: Secondary | ICD-10-CM

## 2014-06-09 DIAGNOSIS — G8918 Other acute postprocedural pain: Secondary | ICD-10-CM

## 2014-06-09 DIAGNOSIS — E785 Hyperlipidemia, unspecified: Secondary | ICD-10-CM

## 2014-06-09 DIAGNOSIS — J309 Allergic rhinitis, unspecified: Secondary | ICD-10-CM

## 2014-06-09 LAB — LIPID PANEL
Cholesterol: 249 mg/dL — ABNORMAL HIGH (ref 0–200)
HDL: 58 mg/dL (ref >39)
LDL CALC: 174 mg/dL — AB (ref 0–99)
TRIGLYCERIDES: 86 mg/dL (ref <150)
Total CHOL/HDL Ratio: 4.3 Ratio
VLDL: 17 mg/dL (ref 0–40)

## 2014-06-09 NOTE — Assessment & Plan Note (Signed)
Assessment: Patient had previously asked whether she should get the MMR vaccine (see MyChart discussion).  The current CDC recommendation is that adults born before 1955 are generally considered immune to measles and mumps. However, the CDC advises that vaccination should be considered for unvaccinated health care personnel born before 1955 who lack laboratory evidence of immunity to measles, mumps, and/or rubella, assuming there is no contraindication to the vaccine. 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  We discussed this again today, and she would like to proceed with antibody testing for immunity to measles,mumps, and rubella.  Plan: Check antibody panel for immunity to measles, mumps, and rubella.

## 2014-06-09 NOTE — Progress Notes (Signed)
   Subjective:    Patient ID: Tina Stone, female    DOB: [birth year], 64 y.o.   MRN: [id number]  HPI Patient returns for followup and management of her seasonal allergic rhinitis, hyperlipidemia, and other medical problems.  She reports improvement in her seasonal allergies symptoms on the fluticasone nasal spray; she uses this at times when her allergy symptoms are worse but not continually.  Her cough has resolved.  The previously noted pain at the side of her breast biopsy has also resolved.  Overall, she is doing well.     Review of Systems  Respiratory: Negative for cough, shortness of breath and wheezing.   Cardiovascular: Negative for chest pain and leg swelling.  Gastrointestinal: Negative for nausea, vomiting and abdominal pain.    I reviewed and updated the medication list, allergies, past medical history, and past surgical history.     Objective:   Physical Exam  Constitutional: No distress.  Cardiovascular: Normal rate and regular rhythm.  Exam reveals no gallop and no friction rub.   No murmur heard. Pulmonary/Chest: Effort normal and breath sounds normal. No respiratory distress. She has no wheezes. She has no rales.  Abdominal: Soft. Bowel sounds are normal. She exhibits no distension. There is no tenderness. There is no rebound and no guarding.  Musculoskeletal: She exhibits no edema.        Assessment & Plan:

## 2014-06-09 NOTE — Assessment & Plan Note (Addendum)
Assessment: Patient reports improvement in her symptoms and resolution of her cough on fluticasone nasal spray since her last visit.  She is only using the nasal spray during periods when her allergies are active.  Plan: Plan is continue fluticasone nasal spray during allergy season.

## 2014-06-09 NOTE — Assessment & Plan Note (Signed)
Assessment: Patient's mild pain at the site of a left breast biopsy has resolved completely.  Plan: No further workup unless pain recurs.

## 2014-06-09 NOTE — Assessment & Plan Note (Signed)
Lipids:    Component Value Date/Time   CHOL 263* 12/02/2013 0925   TRIG 80 12/02/2013 0925   HDL 61 12/02/2013 0925   LDLCALC 186* 12/02/2013 0925   VLDL 16 12/02/2013 0925   CHOLHDL 4.3 12/02/2013 0925    Assessment: Based upon her last lipid panel results, current guidelines recommend considering moderate intensity statin therapy after discussing the risks and benefits of statin therapy with patient.  We previously discussed this, and patient elected therapeutic lifestyle changes.  She reports muscle aching when taking pravastatin in the past.  She reports exercising regularly and following a low-fat diet. We discussed this plan again today.  Plan: Continue therapeutic lifestyle changes; recheck lipid panel today (patient reports that she is fasting).

## 2014-06-10 LAB — MEASLES/MUMPS/RUBELLA IMMUNITY
Mumps IgG: >300 AU/mL — ABNORMAL HIGH (ref <9.00)
Rubella: 26.1 Index — ABNORMAL HIGH (ref <0.90)
Rubeola IgG: >300 AU/mL — ABNORMAL HIGH (ref <25.00)

## 2014-06-24 ENCOUNTER — Other Ambulatory Visit: Payer: Self-pay

## 2014-06-24 DIAGNOSIS — Z1231 Encounter for screening mammogram for malignant neoplasm of breast: Secondary | ICD-10-CM

## 2014-06-30 ENCOUNTER — Encounter: Payer: Self-pay | Admitting: *Deleted

## 2014-07-13 ENCOUNTER — Ambulatory Visit
Admission: RE | Admit: 2014-07-13 | Discharge: 2014-07-13 | Disposition: A | Discharge status: Home / Self Care | Payer: BC Managed Care – PPO | Source: Ambulatory Visit

## 2014-07-13 ENCOUNTER — Telehealth: Payer: Self-pay | Admitting: Internal Medicine

## 2014-07-13 DIAGNOSIS — Z1231 Encounter for screening mammogram for malignant neoplasm of breast: Secondary | ICD-10-CM

## 2014-07-13 DIAGNOSIS — E785 Hyperlipidemia, unspecified: Secondary | ICD-10-CM

## 2014-07-13 NOTE — Telephone Encounter (Signed)
Telephone Contact Note  Lipids:    Component Value Date/Time   CHOL 249* 06/09/2014 0916   TRIG 86 06/09/2014 0916   HDL 58 06/09/2014 0916   LDLCALC 174* 06/09/2014 0916   VLDL 17 06/09/2014 0916   CHOLHDL 4.3 06/09/2014 0916    Assessment: Lipid panel shows some improvement in total cholesterol and LDL on therapeutic lifestyle management; based on estimated 10-yr risk of 6.9% of ASCVD, current ACC guidelines recommend considering moderate intensity statin therapy after discussing the risks and benefits of statin therapy with patient.  I discussed this again with patient, including the option of using a different statin since she reports muscle aching when taking pravastatin in the past.  She again elected therapeutic lifestyle changes.  I advised her to continue exercising regularly and following a low-fat diet.   Plan: Continue therapeutic lifestyle changes including regular exercise and a low fat diet; I recommended that we recheck lipid panel in about 6 months.

## 2014-07-13 NOTE — Assessment & Plan Note (Signed)
Telephone Contact Note  Lipids:    Component Value Date/Time   CHOL 249* 06/09/2014 0916   TRIG 86 06/09/2014 0916   HDL 58 06/09/2014 0916   LDLCALC 174* 06/09/2014 0916   VLDL 17 06/09/2014 0916   CHOLHDL 4.3 06/09/2014 0916    Assessment: Lipid panel shows some improvement in total cholesterol and LDL on therapeutic lifestyle management; based on estimated 10-yr risk of 6.9% of ASCVD, current ACC guidelines recommend considering moderate intensity statin therapy after discussing the risks and benefits of statin therapy with patient.  I discussed this again with patient, including the option of using a different statin since she reports muscle aching when taking pravastatin in the past.  She again elected therapeutic lifestyle changes.  I advised her to continue exercising regularly and following a low-fat diet.   Plan: Continue therapeutic lifestyle changes including regular exercise and a low fat diet; I recommended that we recheck lipid panel in about 6 months.

## 2014-07-13 NOTE — Progress Notes (Signed)
Quick Note:  Results show immunity to Measles/Mumps/Rubella, so vaccine is not indicated. I discussed by phone with patient on 07/13/2014. ______

## 2014-08-11 ENCOUNTER — Encounter (HOSPITAL_COMMUNITY): Payer: Self-pay

## 2014-10-14 ENCOUNTER — Ambulatory Visit (INDEPENDENT_AMBULATORY_CARE_PROVIDER_SITE_OTHER): Payer: BC Managed Care – PPO | Admitting: *Deleted

## 2014-10-14 DIAGNOSIS — Z23 Encounter for immunization: Secondary | ICD-10-CM

## 2014-10-25 ENCOUNTER — Ambulatory Visit: Payer: BC Managed Care – PPO

## 2014-10-28 ENCOUNTER — Telehealth: Payer: Self-pay | Admitting: *Deleted

## 2014-10-28 NOTE — Telephone Encounter (Signed)
Call from pt - for 4 -5 days, she has cough/chest congestion with no fever/body aches; producing some mucous,using OTC Robitussin DM. Pt asked about Z-pack. Suggested trying Mucinex first. States she's able to go to work. I told I told her I would inform her doctor. Thanks

## 2014-10-28 NOTE — Telephone Encounter (Signed)
She will be able to come tomorrow at 1:30PM.  Thanks

## 2014-10-28 NOTE — Telephone Encounter (Signed)
It would be best for patient to be seen and evaluated.  I can see her tomorrow afternoon at 1:30, or urgent care would be an option.

## 2014-10-29 ENCOUNTER — Encounter: Payer: Self-pay | Admitting: Internal Medicine

## 2014-10-29 ENCOUNTER — Encounter: Payer: BC Managed Care – PPO | Admitting: Internal Medicine

## 2014-11-09 ENCOUNTER — Ambulatory Visit: Payer: BC Managed Care – PPO | Admitting: Internal Medicine

## 2015-01-06 ENCOUNTER — Encounter: Payer: Self-pay | Admitting: Internal Medicine

## 2015-01-06 ENCOUNTER — Ambulatory Visit (INDEPENDENT_AMBULATORY_CARE_PROVIDER_SITE_OTHER): Payer: 59 | Admitting: Internal Medicine

## 2015-01-06 VITALS — BP 140/80 | HR 80 | Temp 97.9°F | Ht 64.0 in | Wt 251.4 lb

## 2015-01-06 DIAGNOSIS — E785 Hyperlipidemia, unspecified: Secondary | ICD-10-CM | POA: Diagnosis not present

## 2015-01-06 DIAGNOSIS — Z114 Encounter for screening for human immunodeficiency virus [HIV]: Secondary | ICD-10-CM

## 2015-01-06 DIAGNOSIS — K219 Gastro-esophageal reflux disease without esophagitis: Secondary | ICD-10-CM | POA: Diagnosis not present

## 2015-01-06 DIAGNOSIS — J302 Other seasonal allergic rhinitis: Secondary | ICD-10-CM

## 2015-01-06 DIAGNOSIS — Z0000 Encounter for general adult medical examination without abnormal findings: Secondary | ICD-10-CM

## 2015-01-06 DIAGNOSIS — R718 Other abnormality of red blood cells: Secondary | ICD-10-CM | POA: Diagnosis not present

## 2015-01-06 DIAGNOSIS — E669 Obesity, unspecified: Secondary | ICD-10-CM

## 2015-01-06 LAB — LIPID PANEL
CHOL/HDL RATIO: 4 ratio
Cholesterol: 225 mg/dL — ABNORMAL HIGH (ref 0–200)
HDL: 56 mg/dL (ref >39)
LDL CALC: 157 mg/dL — AB (ref 0–99)
Triglycerides: 58 mg/dL (ref <150)
VLDL: 12 mg/dL (ref 0–40)

## 2015-01-06 LAB — CBC WITH DIFFERENTIAL/PLATELET
BASOS ABS: 0 10*3/uL (ref 0.0–0.1)
Basophils Relative: 0 % (ref 0–1)
EOS PCT: 2 % (ref 0–5)
Eosinophils Absolute: 0.1 10*3/uL (ref 0.0–0.7)
HEMATOCRIT: 37.6 % (ref 36.0–46.0)
Hemoglobin: 12 g/dL (ref 12.0–15.0)
LYMPHS ABS: 1.6 10*3/uL (ref 0.7–4.0)
Lymphocytes Relative: 37 % (ref 12–46)
MCH: 24.2 pg — ABNORMAL LOW (ref 26.0–34.0)
MCHC: 31.9 g/dL (ref 30.0–36.0)
MCV: 75.8 fL — AB (ref 78.0–100.0)
MONOS PCT: 6 % (ref 3–12)
MPV: 9.4 fL (ref 8.6–12.4)
Monocytes Absolute: 0.3 10*3/uL (ref 0.1–1.0)
Neutro Abs: 2.4 10*3/uL (ref 1.7–7.7)
Neutrophils Relative %: 55 % (ref 43–77)
Platelets: 239 10*3/uL (ref 150–400)
RBC: 4.96 MIL/uL (ref 3.87–5.11)
RDW: 17.2 % — AB (ref 11.5–15.5)
WBC: 4.4 10*3/uL (ref 4.0–10.5)

## 2015-01-06 LAB — COMPLETE METABOLIC PANEL WITH GFR
ALK PHOS: 78 U/L (ref 39–117)
ALT: 10 U/L (ref 0–35)
AST: 14 U/L (ref 0–37)
Albumin: 3.6 g/dL (ref 3.5–5.2)
BUN: 12 mg/dL (ref 6–23)
CALCIUM: 8.7 mg/dL (ref 8.4–10.5)
CO2: 26 mEq/L (ref 19–32)
CREATININE: 0.85 mg/dL (ref 0.50–1.10)
Chloride: 104 mEq/L (ref 96–112)
GFR, EST AFRICAN AMERICAN: 85 mL/min
GFR, Est Non African American: 74 mL/min
Glucose, Bld: 81 mg/dL (ref 70–99)
Potassium: 3.9 mEq/L (ref 3.5–5.3)
SODIUM: 138 meq/L (ref 135–145)
Total Bilirubin: 0.5 mg/dL (ref 0.2–1.2)
Total Protein: 6.4 g/dL (ref 6.0–8.3)

## 2015-01-06 LAB — GLUCOSE, CAPILLARY: Glucose-Capillary: 80 mg/dL (ref 70–99)

## 2015-01-06 LAB — FERRITIN: FERRITIN: 90 ng/mL (ref 10–291)

## 2015-01-06 LAB — POCT GLYCOSYLATED HEMOGLOBIN (HGB A1C): Hemoglobin A1C: 5.6

## 2015-01-06 MED ORDER — ZOSTER VACCINE LIVE 19400 UNT/0.65ML ~~LOC~~ SOLR: 0.6500 mL | Freq: Once | SUBCUTANEOUS | Status: DC

## 2015-01-06 NOTE — Assessment & Plan Note (Signed)
Assessment: I discussed the benefits of weight loss and walking for exercise with patient.  Plan: Patient seems committed to increasing her activity and gradually losing weight.

## 2015-01-06 NOTE — Assessment & Plan Note (Signed)
Assessment: I discussed with patient the CDC recommendation for universal HIV screening, and she would like to proceed with HIV screening.  I also discussed the potential benefit of Zostavax to prevent shingles, and discussed with her the possible side effects of the vaccine.  She would like to pursue this, and I provided a prescription for Zostavax today.  Plan: HIV screening; prescription given for Zostavax.Marland Kitchen

## 2015-01-06 NOTE — Assessment & Plan Note (Signed)
Lipids:    Component Value Date/Time   CHOL 249* 06/09/2014 0916   TRIG 86 06/09/2014 0916   HDL 58 06/09/2014 0916   LDLCALC 174* 06/09/2014 0916   VLDL 17 06/09/2014 0916   CHOLHDL 4.3 06/09/2014 0916    Assessment: Based upon her last lipid panel results, current guidelines recommend considering moderate intensity statin therapy after discussing the risks and benefits of statin therapy with patient.  Following that discussion, patient elected therapeutic lifestyle changes.    Plan: Check a lipid panel and comprehensive metabolic panel today; continue therapeutic lifestyle changes pending those results.

## 2015-01-06 NOTE — Patient Instructions (Signed)
Continue current medication.

## 2015-01-06 NOTE — Progress Notes (Signed)
   Subjective:    Patient ID: Tina Stone, female    DOB: September 11, 1950, 65 y.o.   MRN: [id number]  HPI Patient returns for management of her hyperlipidemia, seasonal allergic rhinitis, microcytosis, GERD, and other chronic medical problems.  She reports that she is doing well, without any complaints today.  Her only medication is Flonase nasal spray which she takes as needed during the allergy season.  The pain that she previously reported at her breast biopsy site has resolved completely.   Review of Systems  Constitutional: Negative for fever, chills and diaphoresis.  Respiratory: Negative for cough, shortness of breath and wheezing.   Cardiovascular: Negative for chest pain and leg swelling.  Gastrointestinal: Negative for nausea, vomiting, abdominal pain and blood in stool.  Genitourinary: Negative for dysuria and frequency.  Musculoskeletal: Negative for arthralgias.  Skin: Negative for rash.  Neurological: Negative for dizziness, syncope, weakness and numbness.    I reviewed and updated the medication list, allergies, past medical history, past surgical history, family history, and social history.     Objective:   Physical Exam  Constitutional: No distress.  Cardiovascular: Normal rate, regular rhythm, S1 normal, S2 normal and normal heart sounds.  Exam reveals no gallop and no friction rub.   No murmur heard. No leg edema.  Pulmonary/Chest: Effort normal and breath sounds normal. No respiratory distress. She has no wheezes. She has no rales.  Abdominal: Soft. Bowel sounds are normal. She exhibits no distension. There is no hepatosplenomegaly. There is no tenderness. There is no rebound and no guarding.        Assessment & Plan:

## 2015-01-06 NOTE — Assessment & Plan Note (Signed)
Assessment: Symptoms are well controlled on no medication.  Plan: Follow for recurrence of symptoms.

## 2015-01-06 NOTE — Assessment & Plan Note (Signed)
HEMOGLOBIN  Date Value Ref Range Status  12/02/2013 12.5 12.0 - 15.0 g/dL Final   FERRITIN  Date Value Ref Range Status  12/02/2013 80 10 - 291 ng/mL Final     Assessment: Patient has history of microcytosis with low MCV but normal hemoglobin.    Plan: Check CBC with differential and ferritin.Marland Kitchen

## 2015-01-07 ENCOUNTER — Telehealth: Payer: Self-pay | Admitting: Internal Medicine

## 2015-01-07 DIAGNOSIS — E785 Hyperlipidemia, unspecified: Secondary | ICD-10-CM

## 2015-01-07 LAB — URINALYSIS, ROUTINE W REFLEX MICROSCOPIC
BILIRUBIN URINE: NEGATIVE
Glucose, UA: NEGATIVE mg/dL
Hgb urine dipstick: NEGATIVE
KETONES UR: NEGATIVE mg/dL
Leukocytes, UA: NEGATIVE
NITRITE: NEGATIVE
PROTEIN: NEGATIVE mg/dL
SPECIFIC GRAVITY, URINE: 1.017 (ref 1.005–1.030)
UROBILINOGEN UA: 0.2 mg/dL (ref 0.0–1.0)
pH: 6 (ref 5.0–8.0)

## 2015-01-07 LAB — HIV ANTIBODY (ROUTINE TESTING W REFLEX): HIV 1&2 Ab, 4th Generation: NONREACTIVE

## 2015-01-07 MED ORDER — PRAVASTATIN SODIUM 10 MG PO TABS: 10.0000 mg | ORAL_TABLET | Freq: Every day | ORAL | Status: DC

## 2015-01-07 NOTE — Assessment & Plan Note (Signed)
Telephone Note  Lipids:    Component Value Date/Time   CHOL 225* 01/06/2015 0941   TRIG 58 01/06/2015 0941   HDL 56 01/06/2015 0941   LDLCALC 157* 01/06/2015 0941   VLDL 12 01/06/2015 0941   CHOLHDL 4.0 01/06/2015 0941    Patient is currently managing her hypercholesterolemia with diet and lifestyle measures, on no medications.  Her total cholesterol and LDL have improved compared to prior values from 7 months ago, but are still within the range where current guidelines recommend a moderate intensity statin.  I spoke with her by telephone about this today.  She was previously on pravastatin; according to the chart, she tolerated 10 mg daily without problems, but stopped taking the pravastatin after her dose was increased to 20 mg daily because of muscle aches.  She reports that these were not constant but were intermittent when they occurred.  I discussed with her the option of using a low-dose of an alternative statin, butt she would prefer trying pravastatin 10 mg daily again.  I discussed the potential muscle and hepatic side effects of statins, and I advised her to stop the medication and call immediately if she develops any problems on the pravastatin.

## 2015-01-07 NOTE — Telephone Encounter (Signed)
Lipids:    Component Value Date/Time   CHOL 225* 01/06/2015 0941   TRIG 58 01/06/2015 0941   HDL 56 01/06/2015 0941   LDLCALC 157* 01/06/2015 0941   VLDL 12 01/06/2015 0941   CHOLHDL 4.0 01/06/2015 0941    Patient is currently managing her hypercholesterolemia with diet and lifestyle measures, on no medications.  Her total cholesterol and LDL have improved compared to prior values from 7 months ago, but are still within the range where current guidelines recommend a moderate intensity statin.  I spoke with her by telephone about this today.  She was previously on pravastatin; according to the chart, she tolerated 10 mg daily without problems, but stopped taking the pravastatin after her dose was increased to 20 mg daily because of muscle aches.  She reports that these were not constant but were intermittent when they occurred.  I discussed with her the option of using a low-dose of an alternative statin, butt she would prefer trying pravastatin 10 mg daily again.  I discussed the potential muscle and hepatic side effects of statins, and I advised her to stop the medication and call immediately if she develops any problems on the pravastatin.

## 2015-01-14 ENCOUNTER — Encounter: Payer: Self-pay | Admitting: *Deleted

## 2015-02-08 ENCOUNTER — Telehealth: Payer: Self-pay | Admitting: *Deleted

## 2015-02-08 NOTE — Telephone Encounter (Signed)
Pt called past 2 weeks painful urination. Pt denies freq urination or abnormal vag discharge. Appt made for 02/09/15 9:15AM Dr Algis Liming. Hilda Blades Brendan Gadson RN 02/08/15 3:50PM

## 2015-02-09 ENCOUNTER — Encounter: Payer: Self-pay | Admitting: Internal Medicine

## 2015-02-09 ENCOUNTER — Ambulatory Visit (INDEPENDENT_AMBULATORY_CARE_PROVIDER_SITE_OTHER): Payer: 59 | Admitting: Internal Medicine

## 2015-02-09 VITALS — BP 133/87 | HR 64 | Temp 97.5°F | Ht 64.0 in | Wt 251.8 lb

## 2015-02-09 DIAGNOSIS — R1031 Right lower quadrant pain: Secondary | ICD-10-CM | POA: Insufficient documentation

## 2015-02-09 DIAGNOSIS — N3090 Cystitis, unspecified without hematuria: Secondary | ICD-10-CM

## 2015-02-09 DIAGNOSIS — B9689 Other specified bacterial agents as the cause of diseases classified elsewhere: Secondary | ICD-10-CM

## 2015-02-09 DIAGNOSIS — R1032 Left lower quadrant pain: Secondary | ICD-10-CM

## 2015-02-09 LAB — POCT URINALYSIS DIPSTICK
Bilirubin, UA: NEGATIVE
GLUCOSE UA: NEGATIVE
Ketones, UA: NEGATIVE
NITRITE UA: NEGATIVE
Protein, UA: NEGATIVE
Spec Grav, UA: 1.015
Urobilinogen, UA: 0.2
pH, UA: 7

## 2015-02-09 MED ORDER — SULFAMETHOXAZOLE-TRIMETHOPRIM 400-80 MG PO TABS: 1.0000 | ORAL_TABLET | Freq: Two times a day (BID) | ORAL | Status: DC

## 2015-02-09 NOTE — Progress Notes (Signed)
Case discussed with Dr. Sadek soon after the resident saw the patient.  We reviewed the resident's history and exam and pertinent patient test results.  I agree with the assessment, diagnosis, and plan of care documented in the resident's note. 

## 2015-02-09 NOTE — Patient Instructions (Signed)
General Instructions:   Please bring your medicines with you each time you come to clinic.  Medicines may include prescription medications, over-the-counter medications, herbal remedies, eye drops, vitamins, or other pills.   Progress Toward Treatment Goals:  No flowsheet data found.  Self Care Goals & Plans:  No flowsheet data found.  No flowsheet data found.   Care Management & Community Referrals:  Referral 02/02/2014  Referrals made to community resources none

## 2015-02-09 NOTE — Assessment & Plan Note (Addendum)
Symptoms not very typical for UTI but Urine dipstick shows positive for leukocytes and some blood indicating uncomplicated cystitis.  -Bactrim DS for 3 days -UA, UCx

## 2015-02-09 NOTE — Progress Notes (Signed)
   Subjective:   Patient ID: Tina Stone female   DOB: [birth year] 65 y.o.   MRN: [id number]  HPI: [name] is a 65 y.o. woman pmh as listed below presents for concern for possible UTI .   Pt states that actually some lower abdominal cramping started after eating red meat that has been associated with some acid reflux/burning sensation and subsides with exercise. She things that this is similar to her fibroid uterus pain. The patient has not tried any OTCs and says that the heartburn relieves on its of and that the lower abdominal cramping has also gotten better. The patient does not complain of complain of urinary frequency, urgency or dysuria. She also has not had flank pain, fever, chills, or abnormal vaginal discharge or bleeding.    Past Medical History  Diagnosis Date  . HLD (hyperlipidemia)   . GERD (gastroesophageal reflux disease)   . Cholelithiasis   . Allergic rhinitis, seasonal   . Intraductal papilloma of left breast     Patient underwent left needle-localized lumpectomy by Dr. Imogene Burn. Tsuei on 09/09/2013; pathology showed intraductal papilloma with no atypia or malignancy identified.   Current Outpatient Prescriptions  Medication Sig Dispense Refill  . fluticasone (FLONASE) 50 MCG/ACT nasal spray Place 2 sprays into both nostrils daily. 16 g 2  . pravastatin (PRAVACHOL) 10 MG tablet Take 1 tablet (10 mg total) by mouth daily. 30 tablet 3  . zoster vaccine live, PF, (ZOSTAVAX) 14431 UNT/0.65ML injection Inject 19,400 Units into the skin once. 1 each 0   No current facility-administered medications for this visit.   Family History  Problem Relation Age of Onset  . Diabetes Mother   . Hypertension Mother   . Colon polyps Mother 54  . Diabetes Father   . Congestive Heart Failure Father   . Pancreatic cancer Paternal Aunt   . Colon cancer Neg Hx   . Breast cancer Neg Hx   . Lung cancer Neg Hx   . Dementia Mother 17   History   Social History  . Marital  Status: Widowed    Spouse Name: N/A  . Number of Children: N/A  . Years of Education: N/A   Social History Main Topics  . Smoking status: Never Smoker   . Smokeless tobacco: Never Used  . Alcohol Use: No  . Drug Use: No  . Sexual Activity: Not on file   Other Topics Concern  . 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 Objective:  Physical Exam: Filed Vitals:   02/09/15 0905  BP: 133/87  Pulse: 64  Temp: 97.5 F (36.4 C)  TempSrc: Oral  Height: 5\' 4"  (1.626 m)  Weight: 251 lb 12.8 oz (114.216 kg)  SpO2: 100%   General: sitting in chair, NAD  Cardiac: RRR, no rubs, murmurs or gallops Pulm: clear to auscultation bilaterally, moving normal volumes of air Abd: soft, nontender, no CVA tenderness, no suprapubic tenderness, nondistended, BS present, no organomegaly  Ext: warm and well perfused, no pedal edema Neuro: cranial nerves II-XII grossly intact  Assessment & Plan:  Please see problem oriented charting  Pt discussed with Dr. Eppie Gibson

## 2015-02-10 ENCOUNTER — Telehealth: Payer: Self-pay | Admitting: *Deleted

## 2015-02-10 LAB — URINALYSIS, ROUTINE W REFLEX MICROSCOPIC
Bilirubin Urine: NEGATIVE
GLUCOSE, UA: NEGATIVE mg/dL
HGB URINE DIPSTICK: NEGATIVE
Ketones, ur: NEGATIVE mg/dL
LEUKOCYTES UA: NEGATIVE
Nitrite: NEGATIVE
PH: 7.5 (ref 5.0–8.0)
Protein, ur: NEGATIVE mg/dL
Specific Gravity, Urine: 1.013 (ref 1.005–1.030)
Urobilinogen, UA: 0.2 mg/dL (ref 0.0–1.0)

## 2015-02-10 LAB — URINE CULTURE
COLONY COUNT: NO GROWTH
ORGANISM ID, BACTERIA: NO GROWTH

## 2015-02-10 NOTE — Telephone Encounter (Signed)
I tried to reach patient by phone but got no answer.  I agree with stopping the Bactrim.  If the abdominal pain and dizziness recur, she should come in to Urgent Care or the ED.  Otherwise we will follow up on the result of the urine culture to see if any additional treatment for UTI is needed; the urinalysis sent yesterday was negative.

## 2015-02-10 NOTE — Telephone Encounter (Signed)
Pt calls and states she took one of the bactrim and started experiencing abdominal pain and having dizziness. She feels she is allergic to the bactrim. Could you please advise her on what to do, she has stopped the med

## 2015-02-11 NOTE — Telephone Encounter (Signed)
Pt returned dr Marinda Elk and my calls to her, i explained that so far it didn't look like she would need a replacement for the bactrim, she was agreeable and states she feels much better. Advised if her problems return to please call for an appt or go to urg care or ED, she was agreeable

## 2015-02-11 NOTE — Telephone Encounter (Signed)
Urine culture showed no growth, so there is no indication for antibiotic.

## 2015-02-15 NOTE — Telephone Encounter (Signed)
Spoke w/ pt and she is doing well

## 2015-03-16 ENCOUNTER — Encounter: Payer: Self-pay | Admitting: *Deleted

## 2015-03-23 ENCOUNTER — Encounter: Payer: Self-pay | Admitting: Internal Medicine

## 2015-03-23 ENCOUNTER — Ambulatory Visit (INDEPENDENT_AMBULATORY_CARE_PROVIDER_SITE_OTHER): Payer: 59 | Admitting: Internal Medicine

## 2015-03-23 VITALS — BP 134/72 | HR 64 | Temp 97.7°F | Ht 64.0 in | Wt 247.7 lb

## 2015-03-23 DIAGNOSIS — K219 Gastro-esophageal reflux disease without esophagitis: Secondary | ICD-10-CM | POA: Diagnosis not present

## 2015-03-23 DIAGNOSIS — E785 Hyperlipidemia, unspecified: Secondary | ICD-10-CM | POA: Diagnosis not present

## 2015-03-23 DIAGNOSIS — J302 Other seasonal allergic rhinitis: Secondary | ICD-10-CM

## 2015-03-23 DIAGNOSIS — Z0000 Encounter for general adult medical examination without abnormal findings: Secondary | ICD-10-CM

## 2015-03-23 DIAGNOSIS — R718 Other abnormality of red blood cells: Secondary | ICD-10-CM

## 2015-03-23 DIAGNOSIS — Z1159 Encounter for screening for other viral diseases: Secondary | ICD-10-CM

## 2015-03-23 LAB — COMPLETE METABOLIC PANEL WITH GFR
ALBUMIN: 3.9 g/dL (ref 3.5–5.2)
ALK PHOS: 78 U/L (ref 39–117)
ALT: 8 U/L (ref 0–35)
AST: 13 U/L (ref 0–37)
BUN: 15 mg/dL (ref 6–23)
CO2: 27 mEq/L (ref 19–32)
Calcium: 9.2 mg/dL (ref 8.4–10.5)
Chloride: 103 mEq/L (ref 96–112)
Creat: 0.81 mg/dL (ref 0.50–1.10)
GFR, Est African American: >89 mL/min
GFR, Est Non African American: 78 mL/min
Glucose, Bld: 91 mg/dL (ref 70–99)
Potassium: 4 mEq/L (ref 3.5–5.3)
Sodium: 140 mEq/L (ref 135–145)
Total Bilirubin: 0.4 mg/dL (ref 0.2–1.2)
Total Protein: 6.7 g/dL (ref 6.0–8.3)

## 2015-03-23 LAB — CBC WITH DIFFERENTIAL/PLATELET
Basophils Absolute: 0 10*3/uL (ref 0.0–0.1)
Basophils Relative: 0 % (ref 0–1)
Eosinophils Absolute: 0.1 10*3/uL (ref 0.0–0.7)
Eosinophils Relative: 2 % (ref 0–5)
HEMATOCRIT: 39.6 % (ref 36.0–46.0)
HEMOGLOBIN: 12.4 g/dL (ref 12.0–15.0)
Lymphocytes Relative: 29 % (ref 12–46)
Lymphs Abs: 1.2 10*3/uL (ref 0.7–4.0)
MCH: 24.2 pg — AB (ref 26.0–34.0)
MCHC: 31.3 g/dL (ref 30.0–36.0)
MCV: 77.3 fL — ABNORMAL LOW (ref 78.0–100.0)
MONOS PCT: 5 % (ref 3–12)
MPV: 9.4 fL (ref 8.6–12.4)
Monocytes Absolute: 0.2 10*3/uL (ref 0.1–1.0)
NEUTROS ABS: 2.7 10*3/uL (ref 1.7–7.7)
Neutrophils Relative %: 64 % (ref 43–77)
Platelets: 256 10*3/uL (ref 150–400)
RBC: 5.12 MIL/uL — ABNORMAL HIGH (ref 3.87–5.11)
RDW: 17 % — ABNORMAL HIGH (ref 11.5–15.5)
WBC: 4.2 10*3/uL (ref 4.0–10.5)

## 2015-03-23 LAB — IRON AND TIBC
%SAT: 13 % — ABNORMAL LOW (ref 20–55)
IRON: 40 ug/dL — AB (ref 42–145)
TIBC: 307 ug/dL (ref 250–470)
UIBC: 267 ug/dL (ref 125–400)

## 2015-03-23 LAB — FERRITIN: Ferritin: 78 ng/mL (ref 10–291)

## 2015-03-23 NOTE — Assessment & Plan Note (Signed)
Assessment: Symptoms are well controlled on no medication.  Plan: I advised patient to let me know she has any recurrent symptoms.

## 2015-03-23 NOTE — Assessment & Plan Note (Signed)
HEMOGLOBIN  Date Value Ref Range Status  01/06/2015 12.0 12.0 - 15.0 g/dL Final   FERRITIN  Date Value Ref Range Status  01/06/2015 90 10 - 291 ng/mL Final     Assessment: Patient has history of microcytosis with low MCV but normal hemoglobin.  Ferritin level does not indicate iron deficiency as a cause.  Per discussion with Dr. Beryle Beams, this is likely thalassemia trait.  Plan: I discussed this finding with patient today.  The plan is to obtain hemoglobin electrophoresis, a pathologist smear review, and repeat CBC and iron studies, and then review results with Dr. Beryle Beams.

## 2015-03-23 NOTE — Assessment & Plan Note (Signed)
Telephone Contact Note  Lipids:    Component Value Date/Time   CHOL 225* 01/06/2015 0941   TRIG 58 01/06/2015 0941   HDL 56 01/06/2015 0941   LDLCALC 157* 01/06/2015 0941   VLDL 12 01/06/2015 0941   CHOLHDL 4.0 01/06/2015 0941    Assessment: Patient stopped taking pravastatin because of lower abdominal pain which she attributes to the pravastatin; she previously reported muscle aches on a higher dose of pravastatin 20 mg daily.  I discussed with her the option of trying an alternative statin or other medication for her hypercholesterolemia.  At this time she would prefer not to start another medication but to continue her dietary and lifestyle management.  She plans to increase her exercise by walking and using an exercise bike.   Plan: Continue therapeutic lifestyle changes including regular exercise and a low fat diet; she will need a follow-up lipid panel in about 6 months.

## 2015-03-23 NOTE — Assessment & Plan Note (Signed)
Assessment: Patient is doing well with PRN use of fluticasone nasal spray during allergy season.  Plan: Plan is continue fluticasone nasal spray during allergy season.

## 2015-03-23 NOTE — Progress Notes (Signed)
   Subjective:    Patient ID: Tina Stone, female    DOB: 1950/10/03, 65 y.o.   MRN: [id number]  HPI Patient returns for management of her hyperlipidemia, GERD, seasonal allergic rhinitis, and other medical problems.  She has no acute complaints today.  Following her last visit, she started taking pravastatin 10 mg daily but says that she developed lower abdominal pain after about 2 weeks of therapy which she attributed to pravastatin; she stopped the pravastatin and the abdominal pain resolved.  She has had no further abdominal pain or other pain.  She is currently not taking a medication for GERD on a regular basis, and reports no recent symptoms.  She uses Flonase nasal spray when needed for seasonal allergies.   Review of Systems  Constitutional: Negative for fever, chills and diaphoresis.  Respiratory: Negative for cough, shortness of breath and wheezing.   Cardiovascular: Negative for chest pain and leg swelling.  Gastrointestinal: Negative for nausea, vomiting, abdominal pain, diarrhea, blood in stool and anal bleeding.  Genitourinary: Negative for dysuria and frequency.  Musculoskeletal: Negative for myalgias and arthralgias.  Skin: Negative for rash.  Neurological: Negative for dizziness, weakness and numbness.       Objective:   Physical Exam  Constitutional: No distress.  Cardiovascular: Normal rate, regular rhythm and normal heart sounds.  Exam reveals no gallop and no friction rub.   No murmur heard. No lower extremity edema  Pulmonary/Chest: Effort normal and breath sounds normal. No respiratory distress. She has no wheezes. She has no rales.  Abdominal: Soft. Bowel sounds are normal. She exhibits no distension. There is no tenderness. There is no rebound and no guarding.       Assessment & Plan:

## 2015-03-23 NOTE — Assessment & Plan Note (Signed)
Patient agreed to hepatitis C screening.

## 2015-03-23 NOTE — Patient Instructions (Addendum)
Continue current medications.  Cholesterol Cholesterol is a white, waxy, fat-like substance needed by your body in small amounts. The liver makes all the cholesterol you need. Cholesterol is carried from the liver by the blood through the blood vessels. Deposits of cholesterol (plaque) may build up on blood vessel walls. These make the arteries narrower and stiffer. Cholesterol plaques increase the risk for heart attack and stroke.  You cannot feel your cholesterol level even if it is very high. The only way to know it is high is with a blood test. Once you know your cholesterol levels, you should keep a record of the test results. Work with your health care provider to keep your levels in the desired range.  WHAT DO THE RESULTS MEAN?  Total cholesterol is a rough measure of all the cholesterol in your blood.   LDL is the so-called bad cholesterol. This is the type that deposits cholesterol in the walls of the arteries. You want this level to be low.   HDL is the good cholesterol because it cleans the arteries and carries the LDL away. You want this level to be high.  Triglycerides are fat that the body can either burn for energy or store. High levels are closely linked to heart disease.  WHAT ARE THE DESIRED LEVELS OF CHOLESTEROL?  Total cholesterol below 200.   LDL below 100 for people at risk, below 70 for those at very high risk.   HDL above 50 is good, above 60 is best.   Triglycerides below 150.  HOW CAN I LOWER MY CHOLESTEROL?  Diet. Follow your diet programs as directed by your health care provider.   Choose fish or white meat chicken and [place], roasted or baked. Limit fatty cuts of red meat, fried foods, and processed meats, such as sausage and lunch meats.   Eat lots of fresh fruits and vegetables.  Choose whole grains, beans, pasta, potatoes, and cereals.   Use only small amounts of olive, corn, or canola oils.   Avoid butter, mayonnaise, shortening, or palm  kernel oils.  Avoid foods with trans fats.   Drink skim or nonfat milk and eat low-fat or nonfat yogurt and cheeses. Avoid whole milk, cream, ice cream, egg yolks, and full-fat cheeses.   Healthy desserts include angel food cake, ginger snaps, animal crackers, hard candy, popsicles, and low-fat or nonfat frozen yogurt. Avoid pastries, cakes, pies, and cookies.   Exercise. Follow your exercise programs as directed by your health care provider.   A regular program helps decrease LDL and raise HDL.   A regular program helps with weight control.   Do things that increase your activity level like gardening, walking, or taking the stairs. Ask your health care provider about how you can be more active in your daily life.   Medicine. Take medicine only as directed by your health care provider.   Medicine may be prescribed by your health care provider to help lower cholesterol and decrease the risk for heart disease.   If you have several risk factors, you may need medicine even if your levels are normal. Document Released: 09/11/2001 Document Revised: 05/03/2014 Document Reviewed: 09/30/2013 [employer] Patient Information 2015 [place], [place]. This information is not intended to replace advice given to you by your health care provider. Make sure you discuss any questions you have with your health care provider.

## 2015-03-24 LAB — HEPATITIS C ANTIBODY: HCV AB: NEGATIVE

## 2015-03-24 LAB — PATHOLOGIST SMEAR REVIEW

## 2015-03-25 LAB — HEMOGLOBINOPATHY EVALUATION
HGB F QUANT: 0 % (ref 0.0–2.0)
HGB S QUANTITAION: 0 %
Hemoglobin Other: 0 %
Hgb A2 Quant: 2.5 % (ref 2.2–3.2)
Hgb A: 97.5 % (ref 96.8–97.8)

## 2015-04-04 ENCOUNTER — Telehealth: Payer: Self-pay | Admitting: Internal Medicine

## 2015-04-04 DIAGNOSIS — R718 Other abnormality of red blood cells: Secondary | ICD-10-CM

## 2015-04-04 NOTE — Assessment & Plan Note (Signed)
Telephone Contact Note   I reviewed findings of labs including hemoglobin electrophoresis and pathologist smear review with Dr. Beryle Beams, who felt that patient likely has beta thalassemia trait; he advised no further workup.  I called patient and discussed this with her, and reassured her that most patients with beta thalassemia trait are asymptomatic and that no treatment is indicated.  She does not have children, so genetic counseling is not an issue.

## 2015-04-04 NOTE — Telephone Encounter (Signed)
I reviewed findings of labs including hemoglobin electrophoresis and pathologist smear review with Dr. Beryle Beams, who felt that patient likely has beta thalassemia trait; he advised no further workup.  I called patient and discussed this with her, and reassured her that most patients with beta thalassemia trait are asymptomatic and that no treatment is indicated.  She does not have children, so genetic counseling is not an issue.

## 2015-04-04 NOTE — Progress Notes (Signed)
Quick Note:  I reviewed findings of labs including hemoglobin electrophoresis and pathologist smear review with Dr. Beryle Beams, who felt that patient likely has beta thalassemia trait; he advised no further workup. I called patient and discussed this with her, and reassured her that most patients with beta thalassemia trait are asymptomatic and that no treatment is indicated. She does not have children, so genetic counseling is not an issue. ______

## 2015-05-04 ENCOUNTER — Ambulatory Visit: Payer: 59 | Admitting: Internal Medicine

## 2015-06-02 ENCOUNTER — Ambulatory Visit (INDEPENDENT_AMBULATORY_CARE_PROVIDER_SITE_OTHER): Payer: 59 | Admitting: Internal Medicine

## 2015-06-02 ENCOUNTER — Encounter: Payer: Self-pay | Admitting: Internal Medicine

## 2015-06-02 VITALS — BP 142/73 | HR 80 | Temp 98.0°F | Ht 64.0 in | Wt 253.2 lb

## 2015-06-02 DIAGNOSIS — M2669 Other specified disorders of temporomandibular joint: Secondary | ICD-10-CM

## 2015-06-02 DIAGNOSIS — M26649 Arthritis of unspecified temporomandibular joint: Secondary | ICD-10-CM | POA: Insufficient documentation

## 2015-06-02 DIAGNOSIS — R51 Headache: Secondary | ICD-10-CM | POA: Diagnosis not present

## 2015-06-02 DIAGNOSIS — K219 Gastro-esophageal reflux disease without esophagitis: Secondary | ICD-10-CM | POA: Diagnosis not present

## 2015-06-02 MED ORDER — OMEPRAZOLE 20 MG PO CPDR: 20.0000 mg | DELAYED_RELEASE_CAPSULE | Freq: Every day | ORAL | Status: DC

## 2015-06-02 MED ORDER — NAPROXEN SODIUM 220 MG PO TABS: 440.0000 mg | ORAL_TABLET | Freq: Two times a day (BID) | ORAL | Status: DC

## 2015-06-02 NOTE — Patient Instructions (Signed)
Please take Naprosyn 440 mg twice a day for 14 days Please come back if you don't improve after 7 days  Temporomandibular Problems  Temporomandibular joint (TMJ) dysfunction means there are problems with the joint between your jaw and your skull. This is a joint lined by cartilage like other joints in your body but also has a small disc in the joint which keeps the bones from rubbing on each other. These joints are like other joints and can get inflamed (sore) from arthritis and other problems. When this joint gets sore, it can cause headaches and pain in the jaw and the face. CAUSES  Usually the arthritic types of problems are caused by soreness in the joint. Soreness in the joint can also be caused by overuse. This may come from grinding your teeth. It may also come from mis-alignment in the joint. DIAGNOSIS Diagnosis of this condition can often be made by history and exam. Sometimes your caregiver may need X-rays or an MRI scan to determine the exact cause. It may be necessary to see your dentist to determine if your teeth and jaws are lined up correctly. TREATMENT  Most of the time this problem is not serious; however, sometimes it can persist (become chronic). When this happens medications that will cut down on inflammation (soreness) help. Sometimes a shot of cortisone into the joint will be helpful. If your teeth are not aligned it may help for your dentist to make a splint for your mouth that can help this problem. If no physical problems can be found, the problem may come from tension. If tension is found to be the cause, biofeedback or relaxation techniques may be helpful. HOME CARE INSTRUCTIONS   Later in the day, applications of ice packs may be helpful. Ice can be used in a plastic bag with a towel around it to prevent frostbite to skin. This may be used about every 2 hours for 20 to 30 minutes, as needed while awake, or as directed by your caregiver.  Only take over-the-counter or  prescription medicines for pain, discomfort, or fever as directed by your caregiver.  If physical therapy was prescribed, follow your caregiver's directions.  Wear mouth appliances as directed if they were given. Document Released: 09/11/2001 Document Revised: 03/10/2012 Document Reviewed: 12/19/2008 [facility] Patient Information 2015 [place], [place]. This information is not intended to replace advice given to you by your health care provider. Make sure you discuss any questions you have with your health care provider.

## 2015-06-03 NOTE — Assessment & Plan Note (Signed)
Assessment: Most likely diagnosis is right TMJ arthritis. However, this might have been triggered by some kind of a post viral phenomenon given her history of a sore throat with some dizziness a few days prior to the onset of her right-sided face pain. She also reports history of a swollen gland, though this is not palpable on my exam. Dental infection is not suspected based on her physical exam. I do not suspect  URI. I considered vestibular neuritis or labyrinthitis. However, she does not have vertigo, nausea, vomiting.    Plan: 1. Labs/imaging: none 2. Therapy: Naproxen 440 mg twice a day for 10-14 days. Provided her with home instructions on managing pain. 3. Follow up: As needed.

## 2015-06-03 NOTE — Assessment & Plan Note (Signed)
Symptoms are mild. However, since she is going to be on a short course of anti-inflammatory, I will refill her omeprazole.

## 2015-06-03 NOTE — Progress Notes (Signed)
Patient ID: Tina Stone, female   DOB: 16-Apr-1950, 65 y.o.   MRN: [id number]   Subjective:   HPI: [name] is a 65 y.o. with past medical history as listed below presents for an acute visit with right ear ache.    Right ear ache: She states that she developed pain around her right ear a week ago. This was preceded by sore throat, which was mild in nature. She also states that she had a swollen gland on the right side of her face, which has since resolved. She also reports some on and off dizziness but without falls. She has no vertigo, tinnitus, reduced hearing, headache, vomiting or nausea. She lacks fevers or chills. She denies cough. She denied report any drainage from her ear. She did not have a sensation of popping of her ears. No other URI symptoms.  She also requests for refill of her Prolisec due to symptoms of reflux.   Past Medical History  Diagnosis Date  . HLD (hyperlipidemia)   . GERD (gastroesophageal reflux disease)   . Cholelithiasis   . Allergic rhinitis, seasonal   . Intraductal papilloma of left breast     Patient underwent left needle-localized lumpectomy by Dr. Imogene Burn. Tsuei on 09/09/2013; pathology showed intraductal papilloma with no atypia or malignancy identified.    ROS: Constitutional:  Denies fevers, chills, diaphoresis, appetite change and fatigue.  Respiratory: Denies SOB, DOE, cough, chest tightness, and wheezing.  CVS: No chest pain, palpitations and leg swelling.  GI: No abdominal pain, nausea, vomiting, bloody stools GU: No dysuria, frequency, hematuria, or flank pain.  MSK: No myalgias, back pain, joint swelling, arthralgias  Psych: No depression symptoms. No SI or SA.    Objective:  Physical Exam: Filed Vitals:   06/02/15 1447 06/02/15 1448  BP:  142/73  Pulse:  80  Temp:  98 F (36.7 C)  TempSrc:  Oral  Height: 5\' 4"  (1.626 m)   Weight: 253 lb 3.2 oz (114.851 kg)   SpO2:  100%   General: Well nourished. No acute distress.    HEENT: Normal oral mucosa. MMM. Tympanic membranes appear normal bilaterally. She does have tenderness on the right side of her face with focal right TMJ tenderness. I don't appreciate any swelling of the TMJ. However, pain is increased with opening of her mouth. Dental exam is not suspicious for an oral infection. Oropharynx is clear without exudates. I do not appreciate any swollen lymph nodes. Lungs: CTA bilaterally. No wheezing. Heart: RRR; no extra sounds or murmurs  Abdomen: Non-distended, normal bowel sounds, soft, nontender; no hepatosplenomegaly  Extremities: No pedal edema. No joint swelling or tenderness. Neurologic: Normal EOM,  Alert and oriented x3. No obvious neurologic/cranial nerve deficits.  Assessment & Plan:  Discussed case with Dr Daryll Drown See problem based charting for assessment and plan.

## 2015-06-07 ENCOUNTER — Other Ambulatory Visit: Payer: Self-pay

## 2015-06-07 DIAGNOSIS — Z1231 Encounter for screening mammogram for malignant neoplasm of breast: Secondary | ICD-10-CM

## 2015-06-07 NOTE — Progress Notes (Signed)
Internal Medicine Clinic Attending  Case discussed with Dr. Kazibwe soon after the resident saw the patient.  We reviewed the resident's history and exam and pertinent patient test results.  I agree with the assessment, diagnosis, and plan of care documented in the resident's note. 

## 2015-06-07 NOTE — Addendum Note (Signed)
Addended by: Gilles Chiquito B on: 06/07/2015 01:21 PM   Modules accepted: Level of Service

## 2015-07-18 ENCOUNTER — Ambulatory Visit
Admission: RE | Admit: 2015-07-18 | Discharge: 2015-07-18 | Disposition: A | Discharge status: Home / Self Care | Payer: 59 | Source: Ambulatory Visit

## 2015-07-18 DIAGNOSIS — Z1231 Encounter for screening mammogram for malignant neoplasm of breast: Secondary | ICD-10-CM

## 2015-09-28 ENCOUNTER — Ambulatory Visit (INDEPENDENT_AMBULATORY_CARE_PROVIDER_SITE_OTHER): Payer: 59 | Admitting: Internal Medicine

## 2015-09-28 ENCOUNTER — Encounter: Payer: Self-pay | Admitting: Internal Medicine

## 2015-09-28 VITALS — BP 132/80 | HR 99 | Temp 97.7°F | Ht 64.0 in | Wt 253.4 lb

## 2015-09-28 DIAGNOSIS — M545 Low back pain, unspecified: Secondary | ICD-10-CM | POA: Insufficient documentation

## 2015-09-28 MED ORDER — NAPROXEN SODIUM 220 MG PO TABS: 440.0000 mg | ORAL_TABLET | Freq: Two times a day (BID) | ORAL | Status: DC

## 2015-09-28 NOTE — Assessment & Plan Note (Addendum)
No radicular S&S to suggest nerve compression; no red flag symptoms to suggest cauda equina, spinal or vertebral infection.  Intraductal papilloma w/o atypia or malignant cells in 2014 and recent normal mammo in 2016, no Ca hx and no S&S suggestive of malignancy.  No rash in dermatomal distribution to suggest shingles.  She is able to go about usual exercise routine w/o pain and her sleep is not affected.  Likely MSK etiology.  She has only tried heat which gave little relief.  Given short duration, reassuring exam and lack of red flags, there is no indication for imaging at this time.  Will treat conservatively as below: - Aleve 440mg  BID with meals x 7days - ice (wrapped in towel) to affected area prn for 10 min at a time - activity as tolerated, can continue to walk and swim - RTC in 2 weeks for follow-up, may need to try muscle relaxant and/or PT if still having pain

## 2015-09-28 NOTE — Patient Instructions (Signed)
1. Please take Aleve twice per day for the next week.  Take this med with food so it does not aggravate your stomach.  Try ice wrapped in a towel to the affected area for 10 minutes at a time several times per day.  Rest when you can but keep up your activity as you can tolerate.  You can continue to swim and exercise if it does not hurt your back.  Please come back to see [place] in 1-2 weeks if you are still having pain.  We may be able to try another type of med at that time, such as a muscle relaxant.   2. Please take all medications as prescribed.    3. If you have worsening of your symptoms or new symptoms arise, please call the clinic ([phone]), or go to the ER immediately if symptoms are severe.   Back Pain, Adult Low back pain is very common. About 1 in 5 people have back pain.The cause of low back pain is rarely dangerous. The pain often gets better over time.About half of people with a sudden onset of back pain feel better in just 2 weeks. About 8 in 10 people feel better by 6 weeks.  CAUSES Some common causes of back pain include:  Strain of the muscles or ligaments supporting the spine.  Wear and tear (degeneration) of the spinal discs.  Arthritis.  Direct injury to the back. DIAGNOSIS Most of the time, the direct cause of low back pain is not known.However, back pain can be treated effectively even when the exact cause of the pain is unknown.Answering your caregiver's questions about your overall health and symptoms is one of the most accurate ways to make sure the cause of your pain is not dangerous. If your caregiver needs more information, he or she may order lab work or imaging tests (X-rays or MRIs).However, even if imaging tests show changes in your back, this usually does not require surgery. HOME CARE INSTRUCTIONS For many people, back pain returns.Since low back pain is rarely dangerous, it is often a condition that people can learn to [facility] their own.   Remain  active. It is stressful on the back to sit or stand in one place. Do not sit, drive, or stand in one place for more than 30 minutes at a time. Take short walks on level surfaces as soon as pain allows.Try to increase the length of time you walk each day.  Do not stay in bed.Resting more than 1 or 2 days can delay your recovery.  Do not avoid exercise or work.Your body is made to move.It is not dangerous to be active, even though your back may hurt.Your back will likely heal faster if you return to being active before your pain is gone.  Pay attention to your body when you bend and lift. Many people have less discomfortwhen lifting if they bend their knees, keep the load close to their bodies,and avoid twisting. Often, the most comfortable positions are those that put less stress on your recovering back.  Find a comfortable position to sleep. Use a firm mattress and lie on your side with your knees slightly bent. If you lie on your back, put a pillow under your knees.  Only take over-the-counter or prescription medicines as directed by your caregiver. Over-the-counter medicines to reduce pain and inflammation are often the most helpful.Your caregiver may prescribe muscle relaxant drugs.These medicines help dull your pain so you can more quickly return to your normal activities  and healthy exercise.  Put ice on the injured area.  Put ice in a plastic bag.  Place a towel between your skin and the bag.  Leave the ice on for 15-20 minutes, 03-04 times a day for the first 2 to 3 days. After that, ice and heat may be alternated to reduce pain and spasms.  Ask your caregiver about trying back exercises and gentle massage. This may be of some benefit.  Avoid feeling anxious or stressed.Stress increases muscle tension and can worsen back pain.It is important to recognize when you are anxious or stressed and learn ways to manage it.Exercise is a great option. SEEK MEDICAL CARE IF:  You  have pain that is not relieved with rest or medicine.  You have pain that does not improve in 1 week.  You have new symptoms.  You are generally not feeling well. SEEK IMMEDIATE MEDICAL CARE IF:   You have pain that radiates from your back into your legs.  You develop new bowel or bladder control problems.  You have unusual weakness or numbness in your arms or legs.  You develop nausea or vomiting.  You develop abdominal pain.  You feel faint. Document Released: 12/17/2005 Document Revised: 06/17/2012 Document Reviewed: 04/20/2014 [facility] Patient Information 2015 [place], [place]. This information is not intended to replace advice given to you by your health care provider. Make sure you discuss any questions you have with your health care provider.

## 2015-09-28 NOTE — Progress Notes (Signed)
Subjective:    Patient ID: Tina Stone, female    DOB: November 17, 1950, 65 y.o.   MRN: [id number]  HPI Comments: Tina Stone is a 65 year old woman with PMH as below here with c/o acute non-traumatic low back pain x 5 days.    Back Pain This is a new problem. The current episode started in the past 7 days (started five days ago at 2/10 and increased two days ago 7/10 ). The problem has been gradually worsening since onset. The pain does not radiate. The pain is at a severity of 7/10. The pain is severe. The pain is the same all the time. Exacerbated by: change of position - rising and sitting. Pertinent negatives include no abdominal pain, bladder incontinence, bowel incontinence, chest pain, dysuria, fever, headaches, leg pain, numbness, paresis, paresthesias, pelvic pain, perianal numbness, tingling, weakness or weight loss. She has tried heat for the symptoms. The treatment provided no relief.  Swims 2-3 x per week.  Walks or jogs 1-2x per week.  She has continued to go to gym.   Past Medical History  Diagnosis Date  . HLD (hyperlipidemia)   . GERD (gastroesophageal reflux disease)   . Cholelithiasis   . Allergic rhinitis, seasonal   . Intraductal papilloma of left breast     Patient underwent left needle-localized lumpectomy by Dr. Imogene Burn. Tsuei on 09/09/2013; pathology showed intraductal papilloma with no atypia or malignancy identified.   Current Outpatient Prescriptions on File Prior to Visit  Medication Sig Dispense Refill  . fluticasone (FLONASE) 50 MCG/ACT nasal spray Place 2 sprays into both nostrils daily. 16 g 2  . naproxen sodium (ALEVE) 220 MG tablet Take 2 tablets (440 mg total) by mouth 2 (two) times daily with a meal. 56 tablet 0  . omeprazole (PRILOSEC) 20 MG capsule Take 1 capsule (20 mg total) by mouth daily. 30 capsule 1  . zoster vaccine live, PF, (ZOSTAVAX) 14970 UNT/0.65ML injection Inject 19,400 Units into the skin once. 1 each 0   No current facility-administered  medications on file prior to visit.    Review of Systems  Constitutional: Negative for fever, weight loss, appetite change and unexpected weight change.  Cardiovascular: Negative for chest pain and palpitations.  Gastrointestinal: Negative for nausea, vomiting, abdominal pain and bowel incontinence.  Genitourinary: Negative for bladder incontinence, dysuria and pelvic pain.  Musculoskeletal: Positive for back pain.  Neurological: Negative for tingling, weakness, numbness, headaches and paresthesias.       Filed Vitals:   09/28/15 1418  BP: 132/80  Pulse: 99  Temp: 97.7 F (36.5 C)  TempSrc: Oral  Height: 5\' 4"  (1.626 m)  Weight: 253 lb 6.4 oz (114.941 kg)  SpO2: 99%    Objective:   Physical Exam  Constitutional: She is oriented to person, place, and time. She appears well-developed. No distress.  HENT:  Head: Normocephalic and atraumatic.  Mouth/Throat: Oropharynx is clear and moist. No oropharyngeal exudate.  Eyes: Conjunctivae and EOM are normal. Pupils are equal, round, and reactive to light. Right eye exhibits no discharge. Left eye exhibits no discharge. No scleral icterus.  Cardiovascular: Normal rate, regular rhythm and normal heart sounds.  Exam reveals no gallop and no friction rub.   No murmur heard. Pulmonary/Chest: Effort normal and breath sounds normal. No respiratory distress. She has no wheezes. She has no rales.  Abdominal: Soft. Bowel sounds are normal. She exhibits no distension. There is no tenderness. There is no rebound and no guarding.  Musculoskeletal: She exhibits  tenderness. She exhibits no edema.  She is TTP left upper lumbar paraspinal musculature.  There is no central spine TTP.  Significant left sided low back pain with rising from chair.  Once up she is able to stand and walk without pain.    Neurological: She is alert and oriented to person, place, and time. No cranial nerve deficit.  5/5 LE and UE muscle strength.  Sensation is grossly intact.   Negative straight leg raise B/L.  Skin: Skin is warm. No rash noted. She is not diaphoretic.  There is no rash in the affected area.  Psychiatric: She has a normal mood and affect. Her behavior is normal. Judgment and thought content normal.  Vitals reviewed.         Assessment & Plan:  Please see problem based charting for assessment and plan.

## 2015-10-05 ENCOUNTER — Telehealth: Payer: Self-pay | Admitting: *Deleted

## 2015-10-05 NOTE — Telephone Encounter (Signed)
RTC to patient message left that Clinics had returned her call.  Regino Schultze Tina Lawrie,RN 10/05/2015 9:12 AM

## 2015-10-10 NOTE — Progress Notes (Signed)
Internal Medicine Clinic Attending  Case discussed with Dr. Wilson soon after the resident saw the patient.  We reviewed the resident's history and exam and pertinent patient test results.  I agree with the assessment, diagnosis, and plan of care documented in the resident's note.  

## 2015-10-12 ENCOUNTER — Ambulatory Visit: Payer: 59

## 2015-10-12 ENCOUNTER — Ambulatory Visit (INDEPENDENT_AMBULATORY_CARE_PROVIDER_SITE_OTHER): Payer: 59 | Admitting: *Deleted

## 2015-10-12 DIAGNOSIS — Z23 Encounter for immunization: Secondary | ICD-10-CM | POA: Diagnosis not present

## 2015-10-17 ENCOUNTER — Ambulatory Visit: Payer: 59 | Admitting: Internal Medicine

## 2015-12-28 ENCOUNTER — Ambulatory Visit (INDEPENDENT_AMBULATORY_CARE_PROVIDER_SITE_OTHER): Payer: 59 | Admitting: Internal Medicine

## 2015-12-28 ENCOUNTER — Encounter: Payer: Self-pay | Admitting: Internal Medicine

## 2015-12-28 VITALS — BP 135/82 | HR 75 | Temp 97.8°F | Wt 262.5 lb

## 2015-12-28 DIAGNOSIS — M2669 Other specified disorders of temporomandibular joint: Secondary | ICD-10-CM | POA: Diagnosis not present

## 2015-12-28 DIAGNOSIS — M26649 Arthritis of unspecified temporomandibular joint: Secondary | ICD-10-CM

## 2015-12-28 DIAGNOSIS — J302 Other seasonal allergic rhinitis: Secondary | ICD-10-CM | POA: Diagnosis not present

## 2015-12-28 NOTE — Patient Instructions (Signed)
Thank you for your visit today.   Please return to the internal medicine clinic at your scheduled visit.  Please return sooner if needed.      I have made the following additions/changes to your medications:  You likely have TMJ.  You may take ibuprofen as directed on the bottle for pain.    I have made the following referrals for you: You need to see a dentist.   You need the following test(s) for regular health maintenance: Shingles vaccine.   Please be sure to bring all of your medications with you to every visit; this includes herbal supplements, vitamins, eye drops, and any over-the-counter medications.   Should you have any questions regarding your medications and/or any new or worsening symptoms, please be sure to call the clinic at [phone].   If you believe that you are suffering from a life threatening condition or one that may result in the loss of limb or function, then you should call 911 and proceed to the nearest Emergency Department.   A healthy lifestyle and preventative care can promote health and wellness.   Maintain regular health, dental, and eye exams.  Eat a healthy diet. Foods like vegetables, fruits, whole grains, low-fat dairy products, and lean protein foods contain the nutrients you need without too many calories. Decrease your intake of foods high in solid fats, added sugars, and salt. Get information about a proper diet from your caregiver, if necessary.  Regular physical exercise is one of the most important things you can do for your health. Most adults should get at least 150 minutes of moderate-intensity exercise (any activity that increases your heart rate and causes you to sweat) each week. In addition, most adults need muscle-strengthening exercises on 2 or more days a week.   Maintain a healthy weight. The body mass index (BMI) is a screening tool to identify possible weight problems. It provides an estimate of body fat based on height and  weight. Your caregiver can help determine your BMI, and can help you achieve or maintain a healthy weight. For adults 20 years and older:  A BMI below 18.5 is considered underweight.  A BMI of 18.5 to 24.9 is normal.  A BMI of 25 to 29.9 is considered overweight.  A BMI of 30 and above is considered obese.  Otitis Externa Otitis externa is a germ infection in the outer ear. The outer ear is the area from the eardrum to the outside of the ear. Otitis externa is sometimes called "swimmer's ear." HOME CARE  Put drops in the ear as told by your doctor.  Only take medicine as told by your doctor.  If you have diabetes, your doctor may give you more directions. Follow your doctor's directions.  Keep all doctor visits as told. To avoid another infection:  Keep your ear dry. Use the corner of a towel to dry your ear after swimming or bathing.  Avoid scratching or putting things inside your ear.  Avoid swimming in lakes, dirty water, or pools that use a chemical called chlorine poorly.  You may use ear drops after swimming. Combine equal amounts of white vinegar and alcohol in a bottle. Put 3 or 4 drops in each ear. GET HELP IF:   You have a fever.  Your ear is still red, puffy (swollen), or painful after 3 days.  You still have yellowish-white fluid (pus) coming from the ear after 3 days.  Your redness, puffiness, or pain gets worse.  You have  a really bad headache.  You have redness, puffiness, pain, or tenderness behind your ear. MAKE SURE YOU:   Understand these instructions.  Will watch your condition.  Will get help right away if you are not doing well or get worse.   This information is not intended to replace advice given to you by your health care provider. Make sure you discuss any questions you have with your health care provider.   Document Released: 06/04/2008 Document Revised: 01/07/2015 Document Reviewed: 01/03/2012 Elsevier Interactive Patient Education  [employer].

## 2015-12-28 NOTE — Progress Notes (Signed)
Internal Medicine Clinic Attending  Case discussed with Dr. Gill soon after the resident saw the patient.  We reviewed the resident's history and exam and pertinent patient test results.  I agree with the assessment, diagnosis, and plan of care documented in the resident's note.  

## 2015-12-28 NOTE — Progress Notes (Signed)
Patient ID: Tina Stone, female   DOB: Apr 29, 1950, 65 y.o.   MRN: [id number]     Subjective:   Patient ID: Tina Stone female    DOB: [birth year] 65 y.o.    MRN: [id number] Health Maintenance Due: Health Maintenance Due  Topic Date Due  . ZOSTAVAX  12/19/2012    _________________________________________________  HPI: Tina Stone is a 65 y.o. female here for a acute visit for ear pain.  Pt has a PMH outlined below.  Please see problem-based charting assessment and plan for further status of patient's chronic medical problems addressed at today's visit.  PMH: Past Medical History  Diagnosis Date  . HLD (hyperlipidemia)   . GERD (gastroesophageal reflux disease)   . Cholelithiasis   . Allergic rhinitis, seasonal   . Intraductal papilloma of left breast     Patient underwent left needle-localized lumpectomy by Dr. Imogene Burn. Tsuei on 09/09/2013; pathology showed intraductal papilloma with no atypia or malignancy identified.    Medications: Current Outpatient Prescriptions on File Prior to Visit  Medication Sig Dispense Refill  . fluticasone (FLONASE) 50 MCG/ACT nasal spray Place 2 sprays into both nostrils daily. (Patient not taking: Reported on 09/28/2015) 16 g 2  . naproxen sodium (ALEVE) 220 MG tablet Take 2 tablets (440 mg total) by mouth 2 (two) times daily with a meal. 30 tablet 0  . omeprazole (PRILOSEC) 20 MG capsule Take 1 capsule (20 mg total) by mouth daily. (Patient not taking: Reported on 09/28/2015) 30 capsule 1  . zoster vaccine live, PF, (ZOSTAVAX) 09811 UNT/0.65ML injection Inject 19,400 Units into the skin once. (Patient not taking: Reported on 09/28/2015) 1 each 0   No current facility-administered medications on file prior to visit.    Allergies: Allergies  Allergen Reactions  . Bactrim [Sulfamethoxazole-Trimethoprim] Other (See Comments)    Abdominal pain, dizziness  . Pravastatin Other (See Comments)    Lower abdominal pain  . Shellfish Allergy  Nausea Only    FH: Family History  Problem Relation Age of Onset  . Diabetes Mother   . Hypertension Mother   . Colon polyps Mother 58  . Diabetes Father   . Congestive Heart Failure Father   . Pancreatic cancer Paternal Aunt   . Colon cancer Neg Hx   . Breast cancer Neg Hx   . Lung cancer Neg Hx   . Dementia Mother 52    SH: Social History   Social History  . Marital Status: Widowed    Spouse Name: N/A  . Number of Children: N/A  . Years of Education: N/A   Social History Main Topics  . Smoking status: Never Smoker   . Smokeless tobacco: Never Used  . Alcohol Use: No  . Drug Use: No  . Sexual Activity: Not Asked   Other Topics Concern  . 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  Eyes: Negative for blurred vision.  Respiratory: Negative for cough and shortness of breath.  Cardiovascular: Negative for chest pain, palpitations and leg swelling.  Gastrointestinal: Negative for nausea, vomiting, abdominal pain, diarrhea, constipation and blood in stool.  Genitourinary: Negative for dysuria, urgency and frequency.  Musculoskeletal: Negative for myalgias and back pain.  Neurological: Negative for dizziness, weakness and headaches.     Objective:   Vital Signs: Filed Vitals:  12/28/15 0913  BP: 135/82  Pulse: 75  Temp: 97.8 F (36.6 C)  TempSrc: Oral  Weight: 262 lb 8 oz (119.069 kg)  SpO2: 100%      BP Readings from Last 3 Encounters:  12/28/15 135/82  09/28/15 132/80  06/02/15 142/73    Physical Exam: Constitutional: Vital signs reviewed.  Patient is in NAD and cooperative with exam.  Head: Normocephalic and atraumatic. Eyes: EOMI, conjunctivae nl, no scleral icterus.  Ears: External canal without erythema or drainage.  TMs normal.   Neck: Supple. Cardiovascular:  RRR Pulmonary/Chest: normal effort, Neurological: A&O x3, cranial nerves II-XII are grossly intact, moving all extremities. Skin: Warm, dry and intact.    Assessment & Plan:   Assessment and plan was discussed and formulated with my attending.

## 2015-12-28 NOTE — Assessment & Plan Note (Addendum)
Pt diagnosed with possible TMJ in the past.  States that for the past 3-4 wks she has experienced on and off left ear pain that radiates into her jaw.  Describes as a shooting/stabbing pain that comes and goes.  States it is worse after chewing.  No fever/chills, ear drainage.  No HA.  Does report some tooth sensitivity on the left upper molar as well.  Does report bruxism.  Also does swim several times weekly and has not been using any ear plugs.  Likely TMJ exacerbation and will need to see a dentist.  Also may have a cavity given her increased tooth sensitivity and pain may be referred.  Normal exam.  -advised to see a dentist -will take ibuprofen PRN for pain -provided information on preventing otitis externa (making sure ear canal is dry and using equal parts alcohol/white vinegar after swimming)  -return precautions given

## 2016-02-13 ENCOUNTER — Ambulatory Visit (INDEPENDENT_AMBULATORY_CARE_PROVIDER_SITE_OTHER): Payer: BLUE CROSS/BLUE SHIELD | Admitting: Internal Medicine

## 2016-02-13 ENCOUNTER — Ambulatory Visit (HOSPITAL_COMMUNITY)
Admission: RE | Admit: 2016-02-13 | Discharge: 2016-02-13 | Disposition: A | Discharge status: Home / Self Care | Payer: BLUE CROSS/BLUE SHIELD | Source: Ambulatory Visit | Attending: Internal Medicine | Admitting: Internal Medicine

## 2016-02-13 ENCOUNTER — Encounter: Payer: Self-pay | Admitting: Internal Medicine

## 2016-02-13 VITALS — BP 139/54 | HR 72 | Temp 98.2°F | Resp 18 | Ht 64.0 in | Wt 258.5 lb

## 2016-02-13 DIAGNOSIS — R05 Cough: Secondary | ICD-10-CM | POA: Insufficient documentation

## 2016-02-13 DIAGNOSIS — R042 Hemoptysis: Secondary | ICD-10-CM | POA: Diagnosis not present

## 2016-02-13 DIAGNOSIS — J209 Acute bronchitis, unspecified: Secondary | ICD-10-CM

## 2016-02-13 MED ORDER — GUAIFENESIN-CODEINE 100-10 MG/5ML PO SOLN: 5.0000 mL | Freq: Four times a day (QID) | ORAL | Status: DC | PRN

## 2016-02-13 MED FILL — CHERATUSSIN AC SYRUP: 100-10 | 6 days supply | Qty: 120 | Fill #0

## 2016-02-13 NOTE — Assessment & Plan Note (Signed)
A: Acute bronchitis  P:  RX Guaifenisen-Codiene cough syrup -Given hemoptysis will obtain CXR but suspect this is due to her bronchitis.

## 2016-02-13 NOTE — Patient Instructions (Signed)
General Instructions:  I am going to send you for a chest xray today.  I think you have bronchitis and I want you to take Guaifensein codeine cough syrup.  This may make your sleepy.  Please bring your medicines with you each time you come to clinic.  Medicines may include prescription medications, over-the-counter medications, herbal remedies, eye drops, vitamins, or other pills.   Progress Toward Treatment Goals:  No flowsheet data found.  Self Care Goals & Plans:  No flowsheet data found.  No flowsheet data found.   Care Management & Community Referrals:  Referral 02/02/2014  Referrals made to community resources none

## 2016-02-13 NOTE — Progress Notes (Signed)
[facility] Subjective:   Patient ID: Tina Stone female   DOB: December 13, 1950 66 y.o.   MRN: [id number]  HPI: [name] is a 66 y.o. female with a PMH detailed below who presents for evaluation of a cough.  She reports 2-3 weeks of productive cough of white sputum. She has tried OTC cough syrups, as well as mucinex DM and has had no relief from her cough.  She reports that her chest hurts some from how much she is coughing.  Her mother has been coughing too and she figured it was a similar viral issue however this morning her sputum was blood tinged.  She has had no recent travel. She has no fevers or chills. No weight loss. No myalgias.    Past Medical History  Diagnosis Date  . HLD (hyperlipidemia)   . GERD (gastroesophageal reflux disease)   . Cholelithiasis   . Allergic rhinitis, seasonal   . Intraductal papilloma of left breast     Patient underwent left needle-localized lumpectomy by Dr. Imogene Burn. Tsuei on 09/09/2013; pathology showed intraductal papilloma with no atypia or malignancy identified.   Current Outpatient Prescriptions  Medication Sig Dispense Refill  . fluticasone (FLONASE) 50 MCG/ACT nasal spray Place 2 sprays into both nostrils daily. (Patient not taking: Reported on 09/28/2015) 16 g 2  . guaiFENesin-codeine 100-10 MG/5ML syrup Take 5 mLs by mouth every 6 (six) hours as needed for cough. 120 mL 1  . naproxen sodium (ALEVE) 220 MG tablet Take 2 tablets (440 mg total) by mouth 2 (two) times daily with a meal. 30 tablet 0  . omeprazole (PRILOSEC) 20 MG capsule Take 1 capsule (20 mg total) by mouth daily. (Patient not taking: Reported on 09/28/2015) 30 capsule 1  . zoster vaccine live, PF, (ZOSTAVAX) 65784 UNT/0.65ML injection Inject 19,400 Units into the skin once. (Patient not taking: Reported on 09/28/2015) 1 each 0   No current facility-administered medications for this visit.   Family History  Problem Relation Age of Onset  . Diabetes  Mother   . Hypertension Mother   . Colon polyps Mother 37  . Diabetes Father   . Congestive Heart Failure Father   . Pancreatic cancer Paternal Aunt   . Colon cancer Neg Hx   . Breast cancer Neg Hx   . Lung cancer Neg Hx   . Dementia Mother 79   Social History   Social History  . Marital Status: Widowed    Spouse Name: N/A  . Number of Children: N/A  . Years of Education: N/A   Social History Main Topics  . Smoking status: Never Smoker   . Smokeless tobacco: Never Used  . Alcohol Use: No  . Drug Use: No  . Sexual Activity: Not Asked   Other Topics Concern  . 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  HENT: Positive for congestion.   Respiratory: Positive for cough, hemoptysis and sputum production. Negative for shortness of breath.   Cardiovascular: Positive for chest pain.  Gastrointestinal: Negative for abdominal pain.  Genitourinary: Negative for dysuria.  Musculoskeletal: Negative for myalgias.     Objective:  Physical Exam: Filed Vitals:   02/13/16 0958  BP: 139/54  Pulse: 72  Temp: 98.2 F (36.8 C)  TempSrc: Oral  Resp:  18  Height: 5\' 4"  (1.626 m)  Weight: 258 lb 8 oz (117.255 kg)  SpO2: 100%  Physical Exam  Constitutional: She is well-developed, well-nourished, and in no distress.  Cardiovascular: Normal rate and regular rhythm.   Pulmonary/Chest: Effort normal and breath sounds normal. No respiratory distress. She has no wheezes.  Musculoskeletal: She exhibits no edema.  Nursing note and vitals reviewed.   Assessment & Plan:  Case discussed with Dr. Daryll Drown  Acute bronchitis A: Acute bronchitis  P:  RX Guaifenisen-Codiene cough syrup -Given hemoptysis will obtain CXR but suspect this is due to her bronchitis.    Medications Ordered Meds ordered this  encounter  Medications  . guaiFENesin-codeine 100-10 MG/5ML syrup    Sig: Take 5 mLs by mouth every 6 (six) hours as needed for cough.    Dispense:  120 mL    Refill:  1   Other Orders Orders Placed This Encounter  Procedures  . DG Chest 2 View    Standing Status: Future     Number of Occurrences: 1     Standing Expiration Date: 04/12/2017    Order Specific Question:  Reason for Exam (SYMPTOM  OR DIAGNOSIS REQUIRED)    Answer:  productive cough x 2 weeks. blood streaked this morning.    Order Specific Question:  Preferred imaging location?    Answer:  [facility]   Follow Up: Return if symptoms worsen or fail to improve.

## 2016-02-18 NOTE — Progress Notes (Signed)
Internal Medicine Clinic Attending  Case discussed with Dr. Hoffman at the time of the visit.  We reviewed the resident's history and exam and pertinent patient test results.  I agree with the assessment, diagnosis, and plan of care documented in the resident's note.  

## 2016-03-05 ENCOUNTER — Encounter: Payer: Self-pay | Admitting: Student in an Organized Health Care Education/Training Program

## 2016-03-05 ENCOUNTER — Ambulatory Visit (INDEPENDENT_AMBULATORY_CARE_PROVIDER_SITE_OTHER): Payer: BLUE CROSS/BLUE SHIELD | Admitting: Student in an Organized Health Care Education/Training Program

## 2016-03-05 VITALS — BP 125/61 | HR 78 | Temp 97.8°F | Ht 64.0 in | Wt 257.1 lb

## 2016-03-05 DIAGNOSIS — Z0000 Encounter for general adult medical examination without abnormal findings: Secondary | ICD-10-CM

## 2016-03-05 DIAGNOSIS — E785 Hyperlipidemia, unspecified: Secondary | ICD-10-CM

## 2016-03-05 DIAGNOSIS — Z6841 Body Mass Index (BMI) 40.0 and over, adult: Secondary | ICD-10-CM

## 2016-03-05 DIAGNOSIS — E669 Obesity, unspecified: Secondary | ICD-10-CM | POA: Diagnosis not present

## 2016-03-05 NOTE — Assessment & Plan Note (Signed)
Last lipids 2016. ASCVD 10 year risk was 6.2%. She eats a healthy diet and exercises regularly. We discussed the risks and benefits of statin therapy for primary prevention of CVD, but based on her low risk I recommended against it for now. Would recheck lipids in 2018.

## 2016-03-05 NOTE — Progress Notes (Signed)
   See Encounters tab for problem-based medical decision making  __________________________________________________________  HPI:  66 year old woman here for annual physical exam and follow-up of obesity. Patient is doing well with no acute complaints. We updated her medications including vitamin supplements that she takes at home. She reports that her weight has been stable since last seen here. She eats a diet that is mostly homemade. Says she avoids fast food and processed foods. Says she avoids Cokes, CT, cookies and cake. She walks for exercises several times a week. Her mother is in her 35s and has a diagnosis of Alzheimer's dementia. The patient now lives with her mother and her sister in her mother's house. She says that her mother is still independent in activities of daily living but needs a lot of help with cooking, cleaning, and finances. This has been a source of stress for her.  No hospitalizations since she was last seen. She had a upper respiratory infection a few months ago that is now resolved. No recent fevers or chills. Denies any chest pain or dyspnea with exertion. Sleeping well at night. Reports her mood is good.  __________________________________________________________  Problem List: Patient Active Problem List   Diagnosis Date Noted  . Beta thalassemia trait 11/25/2013  . Obesity 11/19/2012  . Preventative health care 07/12/2011  . Hyperlipidemia 10/10/2006    Medications: Reconciled today in Epic __________________________________________________________  Physical Exam:  Vital Signs: Filed Vitals:   03/05/16 1015  BP: 125/61  Pulse: 78  Temp: 97.8 F (36.6 C)  TempSrc: Oral  Height: 5\' 4"  (1.626 m)  Weight: 257 lb 1.6 oz (116.62 kg)  SpO2: 100%    Gen: Well appearing, NAD Neck: No cervical LAD, No thyromegaly or nodules, No JVD. CV: RRR, no murmurs Pulm: Normal effort, CTA throughout, no wheezing Ext: Warm, no edema, normal joints Skin: No  atypical appearing moles. No rashes

## 2016-03-05 NOTE — Assessment & Plan Note (Signed)
-   Colon cancer screening due in 2019 (has done colonoscopy before) - Mammography due in 2018 - Cervical cancer screen with pap and cytology in 2018 - DEXA after age 64 - Pneumococcal vaccines after age 70 - HIV and HCV negative in 2016

## 2016-03-05 NOTE — Assessment & Plan Note (Addendum)
Weight is stable since 2014. BMI is 44. She has a relatively healthy diet with the exception of sugar containing products. We talked about limiting sugar intake to 30-40 grams daily. I advised avoiding all processed food and eat a diet of vegetables, fruits, whole grains, and legumes. She exercises modestly and has no limitations.

## 2016-06-29 ENCOUNTER — Other Ambulatory Visit: Payer: Self-pay | Admitting: Student in an Organized Health Care Education/Training Program

## 2016-06-29 DIAGNOSIS — Z1231 Encounter for screening mammogram for malignant neoplasm of breast: Secondary | ICD-10-CM

## 2016-07-23 ENCOUNTER — Ambulatory Visit
Admission: RE | Admit: 2016-07-23 | Discharge: 2016-07-23 | Disposition: A | Discharge status: Home / Self Care | Payer: BLUE CROSS/BLUE SHIELD | Source: Ambulatory Visit | Attending: Student in an Organized Health Care Education/Training Program | Admitting: Student in an Organized Health Care Education/Training Program

## 2016-07-23 DIAGNOSIS — Z1231 Encounter for screening mammogram for malignant neoplasm of breast: Secondary | ICD-10-CM

## 2016-10-01 ENCOUNTER — Ambulatory Visit (INDEPENDENT_AMBULATORY_CARE_PROVIDER_SITE_OTHER): Payer: BLUE CROSS/BLUE SHIELD | Admitting: *Deleted

## 2016-10-01 DIAGNOSIS — Z23 Encounter for immunization: Secondary | ICD-10-CM | POA: Diagnosis not present

## 2017-03-15 ENCOUNTER — Telehealth: Payer: Self-pay | Admitting: Student in an Organized Health Care Education/Training Program

## 2017-03-15 NOTE — Telephone Encounter (Signed)
APT. REMINDER CALL, LMTCB °

## 2017-03-18 ENCOUNTER — Encounter: Payer: Self-pay | Admitting: Student in an Organized Health Care Education/Training Program

## 2017-03-18 ENCOUNTER — Ambulatory Visit (INDEPENDENT_AMBULATORY_CARE_PROVIDER_SITE_OTHER): Payer: BLUE CROSS/BLUE SHIELD | Admitting: Student in an Organized Health Care Education/Training Program

## 2017-03-18 VITALS — BP 144/74 | HR 74 | Temp 97.9°F | Wt 246.2 lb

## 2017-03-18 DIAGNOSIS — S0300XA Dislocation of jaw, unspecified side, initial encounter: Secondary | ICD-10-CM | POA: Insufficient documentation

## 2017-03-18 DIAGNOSIS — Z01419 Encounter for gynecological examination (general) (routine) without abnormal findings: Secondary | ICD-10-CM

## 2017-03-18 DIAGNOSIS — I10 Essential (primary) hypertension: Secondary | ICD-10-CM

## 2017-03-18 DIAGNOSIS — R7303 Prediabetes: Secondary | ICD-10-CM | POA: Insufficient documentation

## 2017-03-18 DIAGNOSIS — Z6841 Body Mass Index (BMI) 40.0 and over, adult: Secondary | ICD-10-CM

## 2017-03-18 DIAGNOSIS — Z0000 Encounter for general adult medical examination without abnormal findings: Secondary | ICD-10-CM

## 2017-03-18 DIAGNOSIS — K219 Gastro-esophageal reflux disease without esophagitis: Secondary | ICD-10-CM

## 2017-03-18 DIAGNOSIS — IMO0001 Reserved for inherently not codable concepts without codable children: Secondary | ICD-10-CM

## 2017-03-18 DIAGNOSIS — R0789 Other chest pain: Secondary | ICD-10-CM

## 2017-03-18 DIAGNOSIS — R51 Headache: Secondary | ICD-10-CM | POA: Diagnosis not present

## 2017-03-18 DIAGNOSIS — Z8249 Family history of ischemic heart disease and other diseases of the circulatory system: Secondary | ICD-10-CM | POA: Diagnosis not present

## 2017-03-18 DIAGNOSIS — R6884 Jaw pain: Secondary | ICD-10-CM | POA: Diagnosis not present

## 2017-03-18 DIAGNOSIS — E785 Hyperlipidemia, unspecified: Secondary | ICD-10-CM

## 2017-03-18 HISTORY — DX: Essential (primary) hypertension: I10

## 2017-03-18 MED ORDER — FAMOTIDINE 40 MG PO TABS: 40.0000 mg | ORAL_TABLET | Freq: Every evening | ORAL | 5 refills | Status: DC

## 2017-03-18 MED FILL — FAMOTIDINE 40 MG TABLET: 40 | 30 days supply | Qty: 30 | Fill #0

## 2017-03-18 NOTE — Assessment & Plan Note (Signed)
Patient with symptoms that are consistent with esophageal reflux disease. She is resistant to the idea of prescription medication, however after discussion she is going to try famotidine. I encouraged her that if we can't get her reflux under control it could place her at a elevated risk for esophageal cancer. Plan for famotidine 40 mg once daily.

## 2017-03-18 NOTE — Assessment & Plan Note (Signed)
Last lipids were in 2016 and mildly elevated. Plan to recheck today.

## 2017-03-18 NOTE — Assessment & Plan Note (Signed)
Clinical course and exam are consistent with temporomandibular disorder. No history of jaw trauma. Headache would be inconsistent with GCA. I advised follow-up with dentistry, but the patient tells me she cannot afford a dentist at this time. I think treatment is going to be supportive currently, I gave her printed information about relaxation techniques, teeth grinding, and anti-inflammatories.

## 2017-03-18 NOTE — Assessment & Plan Note (Signed)
Blood pressure elevated today on initial and repeat check. Hypertension is running the patient's family. She is very resistant to the idea of starting an antihypertensive. I asked her to check blood pressure readings at home to rule out white coat hypertension. Plan is to continue lifestyle modifications.

## 2017-03-18 NOTE — Patient Instructions (Signed)
Temporomandibular Joint Syndrome Temporomandibular joint (TMJ) syndrome is a condition that affects the joints between your jaw and your skull. The TMJs are located near your ears and allow your jaw to open and close. These joints and the nearby muscles are involved in all movements of the jaw. People with TMJ syndrome have pain in the area of these joints and muscles. Chewing, biting, or other movements of the jaw can be difficult or painful. TMJ syndrome can be caused by various things. In many cases, the condition is mild and goes away within a few weeks. For some people, the condition can become a long-term problem. What are the causes? Possible causes of TMJ syndrome include:  Grinding your teeth or clenching your jaw. Some people do this when they are under stress.  Arthritis.  Injury to the jaw.  Head or neck injury.  Teeth or dentures that are not aligned well. In some cases, the cause of TMJ syndrome may not be known. What are the signs or symptoms? The most common symptom is an aching pain on the side of the head in the area of the TMJ. Other symptoms may include:  Pain when moving your jaw, such as when chewing or biting.  Being unable to open your jaw all the way.  Making a clicking sound when you open your mouth.  Headache.  Earache.  Neck or shoulder pain. How is this diagnosed? Diagnosis can usually be made based on your symptoms, your medical history, and a physical exam. Your health care provider may check the range of motion of your jaw. Imaging tests, such as X-rays or an MRI, are sometimes done. You may need to see your dentist to determine if your teeth and jaw are lined up correctly. How is this treated? TMJ syndrome often goes away on its own. If treatment is needed, the options may include:  Eating soft foods and applying ice or heat.  Medicines to relieve pain or inflammation.  Medicines to relax the muscles.  A splint, bite plate, or mouthpiece to  prevent teeth grinding or jaw clenching.  Relaxation techniques or counseling to help reduce stress.  Transcutaneous electrical nerve stimulation (TENS). This helps to relieve pain by applying an electrical current through the skin.  Acupuncture. This is sometimes helpful to relieve pain.  Jaw surgery. This is rarely needed. Follow these instructions at home:  Take medicines only as directed by your health care provider.  Eat a soft diet if you are having trouble chewing.  Apply ice to the painful area.  Put ice in a plastic bag.  Place a towel between your skin and the bag.  Leave the ice on for 20 minutes, 2-3 times a day.  Apply a warm compress to the painful area as directed.  Massage your jaw area and perform any jaw stretching exercises as recommended by your health care provider.  If you were given a mouthpiece or bite plate, wear it as directed.  Avoid foods that require a lot of chewing. Do not chew gum.  Keep all follow-up visits as directed by your health care provider. This is important. Contact a health care provider if:  You are having trouble eating.  You have new or worsening symptoms. Get help right away if:  Your jaw locks open or closed. This information is not intended to replace advice given to you by your health care provider. Make sure you discuss any questions you have with your health care provider. Document Released: 09/11/2001 Document  Revised: 08/16/2016 Document Reviewed: 07/22/2014 Elsevier Interactive Patient Education  2017 [employer].

## 2017-03-18 NOTE — Assessment & Plan Note (Signed)
Patient with an 11 pound weight loss in the last 1 year. I encouraged her that weight loss would be very good for her long-term health. Today her weight is 246 pounds with a BMI around 43. I encouraged her to exercise daily and continue to eat a diet that focuses on whole grains, fruits, vegetables, and legumes.

## 2017-03-18 NOTE — Progress Notes (Signed)
Assessment and Plan:  See Encounters tab for problem-based medical decision making.   __________________________________________________________  HPI:  67 year old woman here for a new complaint of esophageal reflux. Patient reports that she has felt this sensation of reflux most of her adult life intermittently. Over the last few months she has felt a daily sensation of lower chest burning. She says it's worse when she eats certain foods like tomatoes, garlic, and onions. She tries to avoid trigger foods but still has persistent symptoms. Has not tried any over the counters for it. She is resistant to medications and prefers to only use natural vitamins. Second acute complaint today is of pain at the sides of both of her jaws. This is been going on for several months as well. It causes a low level headache around her eyes, jaw, and sinuses. It hurts when she opens her jaw widely and when she chews food. She continues to live an active life. She denies any fatigue or depressed mood. Denies anxiety. She works as a Quarry manager at a [employer]. She is a never smoker, doesn't drink alcohol.  __________________________________________________________  Problem List: Patient Active Problem List   Diagnosis Date Noted  . Hypertension 03/18/2017    Priority: High  . Class 3 obesity without serious comorbidity with body mass index (BMI) of 40.0 to 44.9 in adult [facility]) 11/19/2012    Priority: High  . Prediabetes 03/18/2017    Priority: Medium  . Gastroesophageal reflux disease 01/28/2007    Priority: Medium  . Hyperlipidemia 10/10/2006    Priority: Medium  . TMJ 03/18/2017    Priority: Low  . Beta thalassemia trait 11/25/2013    Priority: Low  . Preventative health care 07/12/2011    Priority: Low    Medications: Reconciled today in Epic __________________________________________________________  Physical Exam:  Vital Signs: Vitals:   03/18/17 0834 03/18/17 0915  BP: (!) 150/91 (!)  144/74  Pulse: 71 74  Temp: 97.9 F (36.6 C)   TempSrc: Oral   SpO2: 100%   Weight: 246 lb 3.2 oz (111.7 kg)     Gen: Well appearing, NAD ENT: OP clear without erythema or exudate. Tenderness over bilateral TM joints with pain with jaw opening.  Neck: No cervical LAD, No thyromegaly or nodules, No JVD. CV: RRR, no murmurs Pulm: Normal effort, CTA throughout, no wheezing Gen: normal external genitalia, normal vaginal walls, normal appearing cervix, there was small bleeding from the cervix after pap obtained. Ext: Warm, no edema, normal joints Skin: No atypical appearing moles. No rashes

## 2017-03-18 NOTE — Assessment & Plan Note (Signed)
Pap smear obtained today, will be the patient's last if normal. Up-to-date on annual mammography. We will talk about pneumococcal vaccination and DEXA screening at next visit when she is over 58.

## 2017-03-19 LAB — HEMOGLOBIN A1C
ESTIMATED AVERAGE GLUCOSE: 114 mg/dL
HEMOGLOBIN A1C: 5.6 % (ref 4.8–5.6)

## 2017-03-19 LAB — LIPID PANEL
CHOL/HDL RATIO: 4.3 ratio (ref 0.0–4.4)
CHOLESTEROL TOTAL: 272 mg/dL — AB (ref 100–199)
HDL: 64 mg/dL (ref >39)
LDL Calculated: 191 mg/dL — ABNORMAL HIGH (ref 0–99)
TRIGLYCERIDES: 84 mg/dL (ref 0–149)
VLDL Cholesterol Cal: 17 mg/dL (ref 5–40)

## 2017-03-21 ENCOUNTER — Encounter: Payer: Self-pay | Admitting: Student in an Organized Health Care Education/Training Program

## 2017-03-21 LAB — CYTOLOGY - PAP
Diagnosis: NEGATIVE
HPV: NOT DETECTED

## 2017-06-11 ENCOUNTER — Other Ambulatory Visit: Payer: Self-pay | Admitting: Student in an Organized Health Care Education/Training Program

## 2017-06-11 DIAGNOSIS — Z1231 Encounter for screening mammogram for malignant neoplasm of breast: Secondary | ICD-10-CM

## 2017-06-17 ENCOUNTER — Ambulatory Visit (INDEPENDENT_AMBULATORY_CARE_PROVIDER_SITE_OTHER): Payer: BLUE CROSS/BLUE SHIELD | Admitting: Student in an Organized Health Care Education/Training Program

## 2017-06-17 ENCOUNTER — Encounter: Payer: Self-pay | Admitting: Student in an Organized Health Care Education/Training Program

## 2017-06-17 VITALS — BP 148/73 | HR 77 | Temp 98.3°F | Ht 64.0 in | Wt 245.9 lb

## 2017-06-17 DIAGNOSIS — I10 Essential (primary) hypertension: Secondary | ICD-10-CM | POA: Diagnosis not present

## 2017-06-17 DIAGNOSIS — Z6841 Body Mass Index (BMI) 40.0 and over, adult: Secondary | ICD-10-CM

## 2017-06-17 DIAGNOSIS — S0300XA Dislocation of jaw, unspecified side, initial encounter: Secondary | ICD-10-CM

## 2017-06-17 DIAGNOSIS — E785 Hyperlipidemia, unspecified: Secondary | ICD-10-CM

## 2017-06-17 DIAGNOSIS — IMO0001 Reserved for inherently not codable concepts without codable children: Secondary | ICD-10-CM

## 2017-06-17 DIAGNOSIS — K219 Gastro-esophageal reflux disease without esophagitis: Secondary | ICD-10-CM | POA: Diagnosis not present

## 2017-06-17 DIAGNOSIS — M26602 Left temporomandibular joint disorder, unspecified: Secondary | ICD-10-CM | POA: Diagnosis not present

## 2017-06-17 DIAGNOSIS — M25561 Pain in right knee: Secondary | ICD-10-CM

## 2017-06-17 NOTE — Assessment & Plan Note (Signed)
Symptomatically improved with nutrition interventions. She is not taking famotidine consistently, which is fine for [place] to discontinue.

## 2017-06-17 NOTE — Assessment & Plan Note (Signed)
We discussed her 10 year ASCVD risk of around 12% and the potential benefits of statins for primary prevention. She experience myalgias many years ago with statin therapy. She is not interested in resuming statins for now, instead would prefer to focus on nutrition and activity, which is fine. I advised I would plan to recheck lipids in 3-4 years.

## 2017-06-17 NOTE — Assessment & Plan Note (Signed)
Weight is stable at 245lbs, but down from 262 pounds in 2016. She is discouraged by the plateau of weight loss despite continued diet and exercise interventions. I encouraged her that she is seeing health and joint benefits from current weightloss and to continue with her current diet and exercise plans.

## 2017-06-17 NOTE — Progress Notes (Signed)
Assessment and Plan:  See Encounters tab for problem-based medical decision making.   __________________________________________________________  HPI:  67 year old woman here for follow up of hyperlipidemia. Patient tells me she has been doing well at home. At her last visit we checked her cholesterol which was mildly elevated, I sent her a letter recommending that we discuss cholesterol-lowering medications. Patient tells me that she would prefer to take a naturalistic approach. At home she has been using large amounts of garlic, roasted and in her food. She also uses about 1-2 tablespoons of apple cider vinegar every day. And takes co-Q10 supplementation. Her energy is great, she feels like these interventions have improved her overall health. She exercises every day at the [facility]. She didn't doing exercises in the pool as well as some walking and jogging. Reports improved nutrition and dieting. Currently does not check blood pressures at home, but says that when she goes to the dental clinic and [place] often the blood pressure is much better than what we measured here. No recent illnesses, hospitalizations, or emergency department visits. No chest pain, no angina, no dyspnea on exertion.  __________________________________________________________  Problem List: Patient Active Problem List   Diagnosis Date Noted  . Hypertension 03/18/2017    Priority: High  . Class 3 obesity without serious comorbidity with body mass index (BMI) of 40.0 to 44.9 in adult [facility]) 11/19/2012    Priority: High  . Prediabetes 03/18/2017    Priority: Medium  . Gastroesophageal reflux disease 01/28/2007    Priority: Medium  . Hyperlipidemia 10/10/2006    Priority: Medium  . Patellofemoral arthralgia of right knee 06/17/2017    Priority: Low  . TMJ 03/18/2017    Priority: Low  . Beta thalassemia trait 11/25/2013    Priority: Low  . Preventative health care 07/12/2011    Priority: Low    Medications:  Reconciled today in Epic __________________________________________________________  Physical Exam:  Vital Signs: Vitals:   06/17/17 0816  BP: (!) 148/73  Pulse: 77  Temp: 98.3 F (36.8 C)  TempSrc: Oral  SpO2: 100%  Weight: 245 lb 14.4 oz (111.5 kg)  Height: 5\' 4"  (1.626 m)    Gen: Well appearing, NAD Neck: No cervical LAD, No thyromegaly or nodules, No JVD. CV: RRR, no murmurs Pulm: Normal effort, CTA throughout, no wheezing Ext: Warm, no edema, minimal crepitus in the right knee without effusion Skin: No atypical appearing moles. No rashes

## 2017-06-17 NOTE — Assessment & Plan Note (Addendum)
Blood pressure elevated today at 144/72 even on recheck. I think antihypertensives would be reasonable, but Tina Stone would like to continue with nutrition and exercise for now. I asked her to obtain a home BP cuff for more data points which we can review in three months. Last BMP was normal in 2016, we should recheck at next visit to see if she is developing CKD as a complication of untreated hypertension.

## 2017-06-17 NOTE — Patient Instructions (Signed)
1. You are doing a great job with your exercise, nutrition, and your weight. Keep up the great work.   2. I would like to see you back in three months to monitor your blood pressure. Please purchase a blood pressure cuff to use at home. Check your blood pressure 3-4 times a week, at a variety of times of day/night, and keep a journal for me. We can review all these blood pressures at our next visit and talk about potential interventions.

## 2017-06-17 NOTE — Assessment & Plan Note (Signed)
Mild discomfort of the right knee, worse when walking down steps and after prolonged sitting (theater sign). History consistent with early arthritis of the patellofemoral joint. Minimal crepitus felt on exam below the patella, no effusion. Not function limiting right now. We talked about supportive care with NSAIDs as needed. If worsens, we can do sunrise xrays of the right knee to assess cartilage losses.

## 2017-06-17 NOTE — Assessment & Plan Note (Signed)
Left sided TMJ still symptomatic. Pt unable to get to dentistry until next year when she establishes with medicare. Plan for NSAIDs as needed for discomfort.

## 2017-07-04 ENCOUNTER — Ambulatory Visit (INDEPENDENT_AMBULATORY_CARE_PROVIDER_SITE_OTHER): Payer: BLUE CROSS/BLUE SHIELD | Admitting: Internal Medicine

## 2017-07-04 VITALS — BP 144/82 | HR 74 | Temp 98.2°F | Wt 246.5 lb

## 2017-07-04 DIAGNOSIS — E785 Hyperlipidemia, unspecified: Secondary | ICD-10-CM

## 2017-07-04 DIAGNOSIS — R51 Headache: Secondary | ICD-10-CM

## 2017-07-04 DIAGNOSIS — I10 Essential (primary) hypertension: Secondary | ICD-10-CM

## 2017-07-04 DIAGNOSIS — R519 Headache, unspecified: Secondary | ICD-10-CM | POA: Insufficient documentation

## 2017-07-04 NOTE — Progress Notes (Addendum)
CC: Hypertension follow up  HPI: [name] is a 67 y.o. with a PMH of HTN and HLD who presents today for evaluation of headache and hypertension follow up. This morning at 3:15 am the patient was awoken by a frontal headache. She initially attributed this to nasal congestion and a sinus headache, as these have increased in frequency over the recent summer months. Since the just received a blood pressure monitor from a friend, so she decided to take her blood pressure. At this time the machine read 173/90. She was nervous about this reading and continued taking her blood pressure for the next few hours and saw it return to her normal BP of 142/95 around 7:15 am. She denies dizziness and changes in vision during these episodes.   The patient has continued her exercise regimen of 1.5 hours of pool workout 3 times per week. She also incorporates strength training out the pool and jogging for 20 minutes at least once a week, sometimes up to 3 times a week. She states that her energy levels for the past few months have been good, but is worried that she is not getting enough vitamin D in her diet. She continues to take apple cider vinegar, coenzyme B09, and garlic supplements for dietary management of HTN and HLD.She also manages her HTN by decreasing her daily salt intake.   Past Medical History:  Diagnosis Date  . Adenomatous colon polyp   . Allergic rhinitis, seasonal   . Beta thalassemia trait 11/25/2013  . Cholelithiasis   . Class 3 obesity without serious comorbidity with body mass index (BMI) of 40.0 to 44.9 in adult ([place]) 11/19/2012  . GERD (gastroesophageal reflux disease)   . HLD (hyperlipidemia)   . Hypertension 03/18/2017  . Intraductal papilloma of left breast    Patient underwent left needle-localized lumpectomy by Dr. Imogene Burn. Tsuei on 09/09/2013; pathology showed intraductal papilloma with no atypia or malignancy identified.  . Prediabetes 03/18/2017   History of elevated  glucose with A1c of 5.8% in 2013. This is consistent with obesity and possible metabolic syndrome. Plan to check hemoglobin A1c today and annually.  Marland Kitchen Uterine fibroid    Review of Systems:   Patient endorses congestion with occasional clear nasal discharge, worse in summer months Patient denies chest pain, SOB, weakness in upper or lower extremities, difficulties with speech, nausea, chest pain and diaphoresis.   Physical Exam:  Vitals:   07/04/17 0829 07/04/17 0920  BP: (!) 143/70 (!) 144/82  Pulse: 74   Temp: 98.2 F (36.8 C)   TempSrc: Oral   SpO2: 100%   Weight: 246 lb 8 oz (111.8 kg)    Physical Exam  Constitutional: She is oriented to person, place, and time.  Eyes: Conjunctivae and EOM are normal. Pupils are equal, round, and reactive to light.  Cardiovascular: Normal rate, regular rhythm and intact distal pulses.   No murmur heard. Pulmonary/Chest: Effort normal and breath sounds normal. She has no wheezes.  Neurological: She is alert and oriented to person, place, and time. No cranial nerve deficit or sensory deficit.  Skin: Skin is warm and dry. Capillary refill takes less than 2 seconds.    Assessment & Plan:   See Encounters Tab for problem based charting.  Patient seen with Dr. Evette Doffing.  Addendum: The patient was called on 07/05/2017 with the results of her BMP, which showed no electrolyte abnormalities or indication of kidney dysfunction. The patient was told that her blood work was normal and instructed  to continue her diet and exercise regimen for management of her diabetes and HTN.

## 2017-07-04 NOTE — Assessment & Plan Note (Addendum)
The patients SBP was slightly elevated above goal of 140 on multiple readings today (taken both manually and with the BP machine). She was instructed that headaches often result from elevated BP and that tighter management with medications could prevent attacks like this in the future. The patient was reluctant to start medications at this time and wanted to continue diet and exercise regimens for BP management. The patient was told to continue taking daily BP measurements at home and to obtain a larger BP cuff, since her current one does not fit properly. A BMP was obtained today to assess for kidney damage and the patient will be contacted with these results. She will follow up in 2 months for reassessment.   Importantly, at the end of the visit she seemed open to the idea of starting a medication. She asked questions about the common side effects of BP medications and was particularly worried about a link between BP meds and Alzheimer's disease. I reassured the patient that there is not strong proof of association between BP medication and Alzheimer's disease. I informed her of potential side effects that patient's sometimes see with common BP medications. She seems open to starting medication after this morning's headache, so it should be followed up at next visit.

## 2017-07-04 NOTE — Assessment & Plan Note (Signed)
The patient's LDL was elevated at her last visit and I reiterated the importance of regulating cholesterol levels, especially in patients with HTN, for preventing heart disease and stroke. She wishes to continue with current diet and lifestyle modifications at this time instead of starting medications.

## 2017-07-04 NOTE — Assessment & Plan Note (Signed)
The patient describes worsening sinus headaches and congestion over the past month or so. She believes it is worse with the change in weather. She was instructed to start nasal irrigation to help with sinus congestion and relieve potential sinus headaches. The patient was given education on how to do this at home and will follow up for reevaluation in 2 months.

## 2017-07-04 NOTE — Patient Instructions (Addendum)
It was a pleasure seeing you today.  We recommend that you continue your current diet and exercise regimen to help control your blood pressure. You can continue to use your blood pressure machine at home to check your blood pressure, but please obtain a larger blood pressure cuff. You can get one at a local pharmacy and ask the pharmacist or other provider to help you if you have trouble. If you continue to have headaches, especially at night, please let your provider know.   Please see instruction for nasal irrigation to help with your sinus headaches.  Please return to clinic in 2 months to follow up on your blood pressure.    BUFFERED ISOTONIC SALINE NASAL IRRIGATION  The Benefits: 1. When you irrigate, the isotonic saline (salt water) acts as a solvent and washes the mucus crusts and other debris from your nose.  2. This decongests and improves the airflow into your nose. The sinus passages begin to open.  3. Studies have also shown that a salt water and an alkaline (baking soda) irrigation solution improves nasal membrane cell function (mucociliary flow of mucus debris).  The Recipe: 1. Choose a 1-quart glass jar that is thoroughly cleansed.  2. Fill with sterile or distilled water, or you can boil water from the tap.  3. Add 1 to 2 heaping teaspoons of "pickling/canning/sea" salt (NOT table salt as it contains a large number of additives). This salt is available at the grocery store in the food canning section.  4. Add 1 teaspoon of Arm & Hammer Baking Soda (pure bicarbonate).  5. Mix ingredients together and store at room temperature. Discard after one week. If you find this solution too strong, you may decrease the amount of salt added to 1 to 1  teaspoons. With children it is often best to start with a milder solution and advance slowly. Irrigate with 240 ml (8 oz) twice daily.  The Instructions: You should plan to irrigate your nose with buffered isotonic saline 2 times per  day. Many people prefer to warm the solution slightly in the microwave - but be sure that the solution is NOT HOT. Stand over the sink (some do this in the shower) and squirt the solution into each side of your nose, keeping your mouth open. This allows you to spit the saltwater out of your mouth. It will not harm you if you swallow a little.  If you have been told to use a nasal steroid such as Flonase, Nasonex, or Nasacort, you should always use isortonic saline solution first, then use your nasal steroid product. The nasal steroid is much more effective when sprayed onto clean nasal membranes and the steroid medicine will reach deeper into the nose.  Most people experience a little burning sensation the first few times they use a isotonic saline solution, but this usually goes away within a few days.

## 2017-07-05 LAB — BMP8+ANION GAP
Anion Gap: 15 mmol/L (ref 10.0–18.0)
BUN/Creatinine Ratio: 12 (ref 12–28)
BUN: 10 mg/dL (ref 8–27)
CALCIUM: 9 mg/dL (ref 8.7–10.3)
CHLORIDE: 100 mmol/L (ref 96–106)
CO2: 25 mmol/L (ref 20–29)
Creatinine, Ser: 0.85 mg/dL (ref 0.57–1.00)
GFR calc non Af Amer: 73 mL/min/{1.73_m2} (ref >59)
GFR, EST AFRICAN AMERICAN: 84 mL/min/{1.73_m2} (ref >59)
GLUCOSE: 88 mg/dL (ref 65–99)
Potassium: 4 mmol/L (ref 3.5–5.2)
Sodium: 140 mmol/L (ref 134–144)

## 2017-07-05 NOTE — Progress Notes (Signed)
Internal Medicine Clinic Attending  I saw and evaluated the patient.  I personally confirmed the key portions of the history and exam documented by Dr. Berneice Gandy and I reviewed pertinent patient test results.  The assessment, diagnosis, and plan were formulated together and I agree with the documentation in the resident's note.  Headache is a new problem and seems to be sinus related. I advised return to clinic if she continues to have headache that awakens her from sleep and we will think about imaging. Hopefully the intermittent headache will resolve with better sinus hygiene. Blood pressure also addressed and still poorly controlled, but she continues to decline medications.

## 2017-07-24 ENCOUNTER — Ambulatory Visit
Admission: RE | Admit: 2017-07-24 | Discharge: 2017-07-24 | Disposition: A | Discharge status: Home / Self Care | Payer: BLUE CROSS/BLUE SHIELD | Source: Ambulatory Visit | Attending: Student in an Organized Health Care Education/Training Program | Admitting: Student in an Organized Health Care Education/Training Program

## 2017-07-24 DIAGNOSIS — Z1231 Encounter for screening mammogram for malignant neoplasm of breast: Secondary | ICD-10-CM

## 2017-07-29 ENCOUNTER — Ambulatory Visit: Payer: BLUE CROSS/BLUE SHIELD

## 2017-08-05 ENCOUNTER — Ambulatory Visit (INDEPENDENT_AMBULATORY_CARE_PROVIDER_SITE_OTHER): Payer: BLUE CROSS/BLUE SHIELD | Admitting: Internal Medicine

## 2017-08-05 ENCOUNTER — Ambulatory Visit (HOSPITAL_COMMUNITY): Payer: BLUE CROSS/BLUE SHIELD

## 2017-08-05 VITALS — BP 158/94 | HR 103 | Temp 98.3°F | Wt 240.6 lb

## 2017-08-05 DIAGNOSIS — Z79899 Other long term (current) drug therapy: Secondary | ICD-10-CM

## 2017-08-05 DIAGNOSIS — R1013 Epigastric pain: Secondary | ICD-10-CM | POA: Diagnosis not present

## 2017-08-05 DIAGNOSIS — R109 Unspecified abdominal pain: Secondary | ICD-10-CM | POA: Diagnosis not present

## 2017-08-05 LAB — CBC WITH DIFFERENTIAL/PLATELET
BASOS ABS: 0 10*3/uL (ref 0.0–0.1)
BASOS PCT: 0 %
EOS ABS: 0 10*3/uL (ref 0.0–0.7)
EOS PCT: 1 %
HCT: 36.7 % (ref 36.0–46.0)
Hemoglobin: 12 g/dL (ref 12.0–15.0)
Lymphocytes Relative: 11 %
Lymphs Abs: 0.7 10*3/uL (ref 0.7–4.0)
MCH: 24 pg — ABNORMAL LOW (ref 26.0–34.0)
MCHC: 32.7 g/dL (ref 30.0–36.0)
MCV: 73.5 fL — AB (ref 78.0–100.0)
MONO ABS: 0.3 10*3/uL (ref 0.1–1.0)
Monocytes Relative: 5 %
Neutro Abs: 5.6 10*3/uL (ref 1.7–7.7)
Neutrophils Relative %: 83 %
PLATELETS: 234 10*3/uL (ref 150–400)
RBC: 4.99 MIL/uL (ref 3.87–5.11)
RDW: 15.5 % (ref 11.5–15.5)
WBC: 6.7 10*3/uL (ref 4.0–10.5)

## 2017-08-05 LAB — COMPREHENSIVE METABOLIC PANEL
ALT: 16 U/L (ref 14–54)
AST: 27 U/L (ref 15–41)
Albumin: 3.7 g/dL (ref 3.5–5.0)
Alkaline Phosphatase: 71 U/L (ref 38–126)
Anion gap: 10 (ref 5–15)
BILIRUBIN TOTAL: 0.9 mg/dL (ref 0.3–1.2)
BUN: 10 mg/dL (ref 6–20)
CALCIUM: 8.9 mg/dL (ref 8.9–10.3)
CHLORIDE: 105 mmol/L (ref 101–111)
CO2: 24 mmol/L (ref 22–32)
CREATININE: 1.02 mg/dL — AB (ref 0.44–1.00)
GFR, EST NON AFRICAN AMERICAN: 57 mL/min — AB (ref >60)
Glucose, Bld: 96 mg/dL (ref 65–99)
Potassium: 3.6 mmol/L (ref 3.5–5.1)
Sodium: 139 mmol/L (ref 135–145)
TOTAL PROTEIN: 6.5 g/dL (ref 6.5–8.1)

## 2017-08-05 LAB — LIPASE, BLOOD: LIPASE: 31 U/L (ref 11–51)

## 2017-08-05 MED ORDER — PANTOPRAZOLE SODIUM 40 MG PO TBEC: 40.0000 mg | DELAYED_RELEASE_TABLET | Freq: Every day | ORAL | 0 refills | Status: DC

## 2017-08-05 MED FILL — PANTOPRAZOLE SOD DR 40 MG T: 40 | 90 days supply | Qty: 90 | Fill #0

## 2017-08-05 NOTE — Progress Notes (Signed)
   CC: Abdominal pain  HPI:  [name] is a 67 y.o. female with a past medical history of conditions listed below presenting to the clinic complaining of acute onset abdominal pain. Please see problem based charting for the status of the patient's current and chronic medical conditions.   Past Medical History:  Diagnosis Date  . Adenomatous colon polyp   . Allergic rhinitis, seasonal   . Beta thalassemia trait 11/25/2013  . Cholelithiasis   . Class 3 obesity without serious comorbidity with body mass index (BMI) of 40.0 to 44.9 in adult ([place]) 11/19/2012  . GERD (gastroesophageal reflux disease)   . HLD (hyperlipidemia)   . Hypertension 03/18/2017  . Intraductal papilloma of left breast    Patient underwent left needle-localized lumpectomy by Dr. Imogene Burn. Tsuei on 09/09/2013; pathology showed intraductal papilloma with no atypia or malignancy identified.  . Prediabetes 03/18/2017   History of elevated glucose with A1c of 5.8% in 2013. This is consistent with obesity and possible metabolic syndrome. Plan to check hemoglobin A1c today and annually.  Marland Kitchen Uterine fibroid    Review of Systems: Pertinent positives mentioned in HPI. Remainder of all ROS negative.   Physical Exam:  Vitals:   08/05/17 1356  BP: (!) 158/94  Pulse: (!) 103  Temp: 98.3 F (36.8 C)  TempSrc: Oral  SpO2: 98%  Weight: 240 lb 9.6 oz (109.1 kg)   Physical Exam  Constitutional: She is oriented to person, place, and time. She appears well-developed and well-nourished. She appears distressed.  HENT:  Head: Normocephalic and atraumatic.  Mouth/Throat: Oropharynx is clear and moist.  Eyes: Right eye exhibits no discharge. Left eye exhibits no discharge.  Cardiovascular: Regular rhythm and intact distal pulses.  Exam reveals no gallop and no friction rub.   No murmur heard. Tachycardic  Pulmonary/Chest: Effort normal and breath sounds normal. No respiratory distress. She has no wheezes. She has no rales.    Abdominal: Soft. Bowel sounds are normal. She exhibits no distension. There is tenderness. There is guarding. There is no rebound.  On initial exam, abdomen tender to palpation in epigastrium, right upper quadrant, periumbilical region, and right lower quadrant. On repeat exam done by attending, abdomen tender to palpation in the epigastrium, periumbilical region, and right lower quadrant only.   Musculoskeletal: She exhibits no edema.  Neurological: She is alert and oriented to person, place, and time.  Skin: Skin is warm and dry. She is not diaphoretic.    Assessment & Plan:   See Encounters Tab for problem based charting.  Patient seen with Dr. Dareen Piano

## 2017-08-05 NOTE — Assessment & Plan Note (Addendum)
History of present illness Patient is presenting with acute onset epigastric abdominal pain and cramping which started after she ate at [employer] this morning. She ate grilled chicken, eggs, and cheese. Her pain started 1 hour after she ate. Soon after, she had a very large solid, well-formed bowel movement. Patient reports having regular bowel movements. Denies having any nausea, vomiting, diarrhea, or constipation. She describes abdominal pain as achy in nature, intermittent, and 8 out of 10 in intensity. The pain radiates from the epigastrium to the right upper quadrant and periumbilical region. No exacerbating factors for the pain. The pain is slightly better with Pepto-Bismol. Reports having a history of GERD for which she is not currently on any medication. Reports having GERD symptoms a few days ago. Patient reports being on a "Keto diet" for the past 3 weeks and has cut out sugar and starch from her diet. She is now eating more fats, meats, and vegetables. States she is also exercising more and has lost weight intentionally over the past few weeks. Denies any ethanol use. She is not a current smoker. Denies having any back or flank pain. Denies having any fevers or chills. Denies having any melena, hematochezia, or hematemesis. States she had her menopause in her 38s. Denies any history of abdominal or gynecologic surgeries. Denies noticing any vaginal discharge. Denies having any dysuria, urinary frequency or urgency.  Assessment Unclear etiology of patient's acute onset abdominal pain. Possible etiologies include peptic ulcer disease, dyspepsia, pancreatitis, cholelithiasis/cholecystitis, and appendicitis. Gastroenteritis less likely as patient is not having any nausea, vomiting, or diarrhea and symptoms started 1 hour after hour after eating at the fast food restaurant. Based on the history, pain does not appear to be related to the genitourinary system. Colonoscopy done in September 2014 showing  colon polyps and mild diverticulosis of the ascending colon. Diverticulosis is a less likely etiology to explain her current presentation. On exam, she initially had pain in the epigastrium, right upper quadrant, periumbilical region, and right lower quadrant. On repeat exam, she did not have pain in the right upper quadrant. No rebound or rigidity.  Plan -Stat CBC, CMP, and lipase -Stat CT abdomen/ pelvis with contrast to rule out perforated peptic ulcer, pancreatitis, cholelithiasis/cholecystitis, and appendicitis.  Addendum 08/05/2017: Lab showing no leukocytosis and hemoglobin stable at 12.0. Lipase normal. LFTs and Tbili normal. Serum creatinine 1.0, slightly above baseline of 0.8. Patient is able to tolerate by mouth intake. Patient states her abdominal pain has now completely resolved. She has no pain on repeat abdominal exam. -Cancel CT scan -Start Protonix 40 mg once daily for dyspepsia -Advised her to call the internal medicine on-call physician if she experiences abdominal pain again. If the pain is severe or she has any nausea or vomiting, she should go to an urgent care center or emergency room immediately. Patient agreed with the plan.

## 2017-08-05 NOTE — Addendum Note (Signed)
Addended by: Shela Leff on: 08/05/2017 04:27 PM   Modules accepted: Orders

## 2017-08-05 NOTE — Patient Instructions (Signed)
Ms. Sossamon it was nice meeting you today  -Start taking Protonix 40 mg once daily  -If you start experiencing abdominal pain again, please call [phone] and ask for the internal medicine teaching service physician on-call. If your pain is severe or you experience any nausea or vomiting, please go to an emergency room or urgent care center immediately.  -Return for a follow-up visit in one month

## 2017-08-07 NOTE — Progress Notes (Signed)
Internal Medicine Clinic Attending  I saw and evaluated the patient.  I personally confirmed the key portions of the history and exam documented by Dr. Rathore and I reviewed pertinent patient test results.  The assessment, diagnosis, and plan were formulated together and I agree with the documentation in the resident's note.  

## 2017-09-16 ENCOUNTER — Encounter: Payer: Self-pay | Admitting: Student in an Organized Health Care Education/Training Program

## 2017-09-16 ENCOUNTER — Ambulatory Visit (INDEPENDENT_AMBULATORY_CARE_PROVIDER_SITE_OTHER): Payer: BLUE CROSS/BLUE SHIELD | Admitting: Student in an Organized Health Care Education/Training Program

## 2017-09-16 VITALS — BP 140/70 | HR 66 | Temp 97.9°F | Ht 64.0 in | Wt 237.6 lb

## 2017-09-16 DIAGNOSIS — Z6841 Body Mass Index (BMI) 40.0 and over, adult: Secondary | ICD-10-CM | POA: Diagnosis not present

## 2017-09-16 DIAGNOSIS — Z23 Encounter for immunization: Secondary | ICD-10-CM

## 2017-09-16 DIAGNOSIS — I10 Essential (primary) hypertension: Secondary | ICD-10-CM

## 2017-09-16 DIAGNOSIS — M25561 Pain in right knee: Secondary | ICD-10-CM

## 2017-09-16 DIAGNOSIS — IMO0001 Reserved for inherently not codable concepts without codable children: Secondary | ICD-10-CM

## 2017-09-16 DIAGNOSIS — E669 Obesity, unspecified: Secondary | ICD-10-CM | POA: Diagnosis not present

## 2017-09-16 DIAGNOSIS — K219 Gastro-esophageal reflux disease without esophagitis: Secondary | ICD-10-CM

## 2017-09-16 NOTE — Patient Instructions (Addendum)
1. Keep up the great work with your diet and weight loss. You are doing a great job.   2. I will see you again in three months, and we will keep a close eye on your blood pressure.   3. You may take Ibuprofen 600mg  for occasional knee pain. Call me if this is not effective or if you are having to take this daily.

## 2017-09-16 NOTE — Assessment & Plan Note (Signed)
Blood pressure is consistently above goal at our visits. The patient consistently declines medication management. We talked about the natural progression of untreated hypertension, and she understands. I am watching her renal function closely, creatinine recently increased from 0.85 to 1.02, in the setting of weight loss, which may indicate developing CKD. She still wants to continue with lifestyle treatments, weight loss, dieting, and declines medication. Follow up in three months for BP recheck, and repeat renal function.

## 2017-09-16 NOTE — Assessment & Plan Note (Signed)
Patient is enjoying fantastic weight loss, down to 237 from 265 last year. She is doing this with lifestyle changes, exercise, nutrition, no medications. I congratulated her, we talked about reasonable goals for the future.

## 2017-09-16 NOTE — Assessment & Plan Note (Signed)
Prescribed pantoprazole in August, but she did not take this medicine for long. Instead she is using an herbal tea made of fennel seeds daily which she finds to be controlling her symptoms. I think this is fine, weight loss is also helping, can offer as needed famotidine in the future if she wants.

## 2017-09-16 NOTE — Assessment & Plan Note (Signed)
Occasional right knee pain is stable, history and exam are most consistent with patellofemoral osteoarthritis. We will continue conservative management, talked about as needed NSAID therapy, maintaining functional status, she is doing great with weight loss.

## 2017-09-16 NOTE — Progress Notes (Signed)
   Assessment and Plan:  See Encounters tab for problem-based medical decision making.   __________________________________________________________  HPI:   67 year old woman here for follow-up of hypertension. Patient reports doing very well at home over the last few months. She has cut out all starches and sugars and has noticed significant weight loss. Goes to the [facility] and exercises several days a week. No chest pain, pressure, or DOE with exercise. As not been checking her blood pressure at home recently, her cuff was giving her falsely elevated readings a few months ago and she has not replaced it yet. Still having some mild reflux symptoms, using fennel seed herbal tea with some relief. Continues to strongly desire not to take prescription medications. Since her last usual visit she had 2 acute visits, one for a headache that woke her up from sleep, but has not recurred. Now resolved. She also had a visit for acute abdominal pain which also spontaneously resolved. Eating and drinking well. No tobacco use.   __________________________________________________________  Problem List: Patient Active Problem List   Diagnosis Date Noted  . Hypertension 03/18/2017    Priority: High  . Class 3 obesity without serious comorbidity with body mass index (BMI) of 40.0 to 44.9 in adult [facility]) 11/19/2012    Priority: High  . Prediabetes 03/18/2017    Priority: Medium  . Gastroesophageal reflux disease 01/28/2007    Priority: Medium  . Hyperlipidemia 10/10/2006    Priority: Medium  . Patellofemoral arthralgia of right knee 06/17/2017    Priority: Low  . TMJ 03/18/2017    Priority: Low  . Beta thalassemia trait 11/25/2013    Priority: Low  . Preventative health care 07/12/2011    Priority: Low    Medications: Reconciled today in Epic __________________________________________________________  Physical Exam:  Vital Signs: Vitals:   09/16/17 0815 09/16/17 0818  BP: (!) 150/77 140/70    Pulse: 66 66  Temp: 97.9 F (36.6 C)   TempSrc: Oral   SpO2: 100%   Weight: 237 lb 9.6 oz (107.8 kg)   Height: 5\' 4"  (1.626 m)     Gen: Well appearing, NAD ENT: OP clear without erythema or exudate.  Neck: No cervical LAD, No thyromegaly or nodules, No JVD. CV: RRR, no murmurs Pulm: Normal effort, CTA throughout, no wheezing Ext: Warm, no edema, mild crepitus of the right knee Skin: No atypical appearing moles. No rashes

## 2017-10-24 ENCOUNTER — Emergency Department (HOSPITAL_COMMUNITY)
Admission: EM | Admit: 2017-10-24 | Discharge: 2017-10-24 | Discharge status: Against Medical Advice | Payer: BLUE CROSS/BLUE SHIELD

## 2017-10-24 ENCOUNTER — Ambulatory Visit (INDEPENDENT_AMBULATORY_CARE_PROVIDER_SITE_OTHER): Payer: BLUE CROSS/BLUE SHIELD | Admitting: Internal Medicine

## 2017-10-24 ENCOUNTER — Encounter: Payer: Self-pay | Admitting: Internal Medicine

## 2017-10-24 VITALS — BP 166/78 | HR 68 | Temp 97.9°F | Ht 64.0 in | Wt 237.6 lb

## 2017-10-24 DIAGNOSIS — R12 Heartburn: Secondary | ICD-10-CM | POA: Diagnosis not present

## 2017-10-24 DIAGNOSIS — I10 Essential (primary) hypertension: Secondary | ICD-10-CM | POA: Diagnosis not present

## 2017-10-24 MED ORDER — HYDROCHLOROTHIAZIDE 12.5 MG PO CAPS: 12.5000 mg | ORAL_CAPSULE | Freq: Every day | ORAL | 0 refills | Status: DC

## 2017-10-24 MED FILL — HYDROCHLOROTHIAZIDE 12.5 MG: 12.5 | 60 days supply | Qty: 60 | Fill #0

## 2017-10-24 NOTE — Progress Notes (Signed)
   CC: HTN  HPI:  Tina Stone is a 67 y.o. female with PMH as listed below who presents for follow up management of her HTN.  HTN: Patient is currently managing her HTN with dietary changes and weight loss as she has been wanting to avoid medical management. She was previously checking her BP at home, however her home BP cuff has malfunctioned. She has noticed that over the last 3-4 months her systolic blood pressures have increased to 140s-160s. She checked her blood pressure this morning prior to her morning exercise routine and says it was elevated to the 165/90s. She did actually go to the ED today only to have her BP checked, which was 166/75. She is interested in starting medical therapy for her HTN now. She does think she may be using extra salt in her diet when seasoning her food.  Past Medical History:  Diagnosis Date  . Adenomatous colon polyp   . Allergic rhinitis, seasonal   . Beta thalassemia trait 11/25/2013  . Cholelithiasis   . Class 3 obesity without serious comorbidity with body mass index (BMI) of 40.0 to 44.9 in adult 11/19/2012  . GERD (gastroesophageal reflux disease)   . HLD (hyperlipidemia)   . Hypertension 03/18/2017  . Intraductal papilloma of left breast    Patient underwent left needle-localized lumpectomy by Dr. Imogene Burn. Tsuei on 09/09/2013; pathology showed intraductal papilloma with no atypia or malignancy identified.  . Prediabetes 03/18/2017   History of elevated glucose with A1c of 5.8% in 2013. This is consistent with obesity and possible metabolic syndrome. Plan to check hemoglobin A1c today and annually.  Marland Kitchen Uterine fibroid    Review of Systems:   Review of Systems  Respiratory: Negative for shortness of breath.   Cardiovascular: Negative for chest pain and leg swelling.  Gastrointestinal: Positive for heartburn.     Physical Exam:  Vitals:   10/24/17 0841  BP: (!) 166/78  Pulse: 68  Temp: 97.9 F (36.6 C)  TempSrc: Oral  SpO2: 100%    Weight: 237 lb 9.6 oz (107.8 kg)  Height: 5\' 4"  (1.626 m)   Physical Exam  Constitutional: She is oriented to person, place, and time. She appears well-developed and well-nourished. No distress.  Cardiovascular: Normal rate and regular rhythm.   No murmur heard. Pulmonary/Chest: Effort normal. No respiratory distress. She has no wheezes. She has no rales.  Musculoskeletal: Normal range of motion.  Neurological: She is alert and oriented to person, place, and time.  Skin: She is not diaphoretic.    Assessment & Plan:   See Encounters Tab for problem based charting.  Patient discussed with Dr. Dareen Piano  Hypertension BP Readings from Last 3 Encounters:  10/24/17 (!) 166/78  09/16/17 140/70  08/05/17 (!) 158/94   Patient with blood pressures that are above goal. She has been wanting to avoid medical therapy, however has agreed to start management of her HTN with antihypertensives. She is interested in seeing a nutritionist and will continue to work on healthy eating patterns and exercise. - Start HCTZ 12.5 mg daily - DASH diet information provided - Refer to our nutritionist, Ms. Butch Penny Plyler, RD  - f/u in 6 weeks for BP check and BMET

## 2017-10-24 NOTE — Patient Instructions (Signed)
It was a pleasure to see you Ms. Zackery.  We will start a medicine for your blood pressure called Hydrochlorothiazide 12.5 mg once daily. I have sent this to the [facility].  Please continue to work on healthy eating patterns and exercise as tolerated.  I have referred you to our nutritionist, Ms. Debera Lat, RD.  Follow up with Dr. Evette Doffing as scheduled or see [place] sooner if needed.   DASH Eating Plan DASH stands for "Dietary Approaches to Stop Hypertension." The DASH eating plan is a healthy eating plan that has been shown to reduce high blood pressure (hypertension). It may also reduce your risk for type 2 diabetes, heart disease, and stroke. The DASH eating plan may also help with weight loss. What are tips for following this plan? General guidelines  Avoid eating more than 2,300 mg (milligrams) of salt (sodium) a day. If you have hypertension, you may need to reduce your sodium intake to 1,500 mg a day.  Limit alcohol intake to no more than 1 drink a day for nonpregnant women and 2 drinks a day for men. One drink equals 12 oz of beer, 5 oz of wine, or 1 oz of hard liquor.  Work with your health care provider to maintain a healthy body weight or to lose weight. Ask what an ideal weight is for you.  Get at least 30 minutes of exercise that causes your heart to beat faster (aerobic exercise) most days of the week. Activities may include walking, swimming, or biking.  Work with your health care provider or diet and nutrition specialist (dietitian) to adjust your eating plan to your individual calorie needs. Reading food labels  Check food labels for the amount of sodium per serving. Choose foods with less than 5 percent of the Daily Value of sodium. Generally, foods with less than 300 mg of sodium per serving fit into this eating plan.  To find whole grains, look for the word "whole" as the first word in the ingredient list. Shopping  Buy products labeled as  "low-sodium" or "no salt added."  Buy fresh foods. Avoid canned foods and premade or frozen meals. Cooking  Avoid adding salt when cooking. Use salt-free seasonings or herbs instead of table salt or sea salt. Check with your health care provider or pharmacist before using salt substitutes.  Do not fry foods. Cook foods using healthy methods such as baking, boiling, grilling, and broiling instead.  Cook with heart-healthy oils, such as olive, canola, soybean, or sunflower oil. Meal planning   Eat a balanced diet that includes: ? 5 or more servings of fruits and vegetables each day. At each meal, try to fill half of your plate with fruits and vegetables. ? Up to 6-8 servings of whole grains each day. ? Less than 6 oz of lean meat, poultry, or fish each day. A 3-oz serving of meat is about the same size as a deck of cards. One egg equals 1 oz. ? 2 servings of low-fat dairy each day. ? A serving of nuts, seeds, or beans 5 times each week. ? Heart-healthy fats. Healthy fats called Omega-3 fatty acids are found in foods such as flaxseeds and coldwater fish, like sardines, salmon, and mackerel.  Limit how much you eat of the following: ? Canned or prepackaged foods. ? Food that is high in trans fat, such as fried foods. ? Food that is high in saturated fat, such as fatty meat. ? Sweets, desserts, sugary drinks, and other foods  with added sugar. ? Full-fat dairy products.  Do not salt foods before eating.  Try to eat at least 2 vegetarian meals each week.  Eat more home-cooked food and less restaurant, buffet, and fast food.  When eating at a restaurant, ask that your food be prepared with less salt or no salt, if possible. What foods are recommended? The items listed may not be a complete list. Talk with your dietitian about what dietary choices are best for you. Grains Whole-grain or whole-wheat bread. Whole-grain or whole-wheat pasta. Brown rice. [place] Morrow. Bulgur. Whole-grain  and low-sodium cereals. Pita bread. Low-fat, low-sodium crackers. Whole-wheat flour tortillas. Vegetables Fresh or frozen vegetables (raw, steamed, roasted, or grilled). Low-sodium or reduced-sodium tomato and vegetable juice. Low-sodium or reduced-sodium tomato sauce and tomato paste. Low-sodium or reduced-sodium canned vegetables. Fruits All fresh, dried, or frozen fruit. Canned fruit in natural juice (without added sugar). Meat and other protein foods Skinless chicken or [place]. Ground chicken or [place]. Pork with fat trimmed off. Fish and seafood. Egg whites. Dried beans, peas, or lentils. Unsalted nuts, nut butters, and seeds. Unsalted canned beans. Lean cuts of beef with fat trimmed off. Low-sodium, lean deli meat. Dairy Low-fat (1%) or fat-free (skim) milk. Fat-free, low-fat, or reduced-fat cheeses. Nonfat, low-sodium ricotta or cottage cheese. Low-fat or nonfat yogurt. Low-fat, low-sodium cheese. Fats and oils Soft margarine without trans fats. Vegetable oil. Low-fat, reduced-fat, or light mayonnaise and salad dressings (reduced-sodium). Canola, safflower, olive, soybean, and sunflower oils. Avocado. Seasoning and other foods Herbs. Spices. Seasoning mixes without salt. Unsalted popcorn and pretzels. Fat-free sweets. What foods are not recommended? The items listed may not be a complete list. Talk with your dietitian about what dietary choices are best for you. Grains Baked goods made with fat, such as croissants, muffins, or some breads. Dry pasta or rice meal packs. Vegetables Creamed or fried vegetables. Vegetables in a cheese sauce. Regular canned vegetables (not low-sodium or reduced-sodium). Regular canned tomato sauce and paste (not low-sodium or reduced-sodium). Regular tomato and vegetable juice (not low-sodium or reduced-sodium). Angie Fava. Olives. Fruits Canned fruit in a light or heavy syrup. Fried fruit. Fruit in cream or butter sauce. Meat and other protein foods Fatty cuts  of meat. Ribs. Fried meat. Berniece Salines. Sausage. Bologna and other processed lunch meats. Salami. Fatback. Hotdogs. Bratwurst. Salted nuts and seeds. Canned beans with added salt. Canned or smoked fish. Whole eggs or egg yolks. Chicken or [place] with skin. Dairy Whole or 2% milk, cream, and half-and-half. Whole or full-fat cream cheese. Whole-fat or sweetened yogurt. Full-fat cheese. Nondairy creamers. Whipped toppings. Processed cheese and cheese spreads. Fats and oils Butter. Stick margarine. Lard. Shortening. Ghee. Bacon fat. Tropical oils, such as coconut, palm kernel, or palm oil. Seasoning and other foods Salted popcorn and pretzels. Onion salt, garlic salt, seasoned salt, table salt, and sea salt. Worcestershire sauce. Tartar sauce. Barbecue sauce. Teriyaki sauce. Soy sauce, including reduced-sodium. Steak sauce. Canned and packaged gravies. Fish sauce. Oyster sauce. Cocktail sauce. Horseradish that you find on the shelf. Ketchup. Mustard. Meat flavorings and tenderizers. Bouillon cubes. Hot sauce and Tabasco sauce. Premade or packaged marinades. Premade or packaged taco seasonings. Relishes. Regular salad dressings. Where to find more information:  National Heart, Lung, and [place]: https://wilson-eaton.com/  American Heart Association: www.heart.org Summary  The DASH eating plan is a healthy eating plan that has been shown to reduce high blood pressure (hypertension). It may also reduce your risk for type 2 diabetes, heart disease, and stroke.  With the  DASH eating plan, you should limit salt (sodium) intake to 2,300 mg a day. If you have hypertension, you may need to reduce your sodium intake to 1,500 mg a day.  When on the DASH eating plan, aim to eat more fresh fruits and vegetables, whole grains, lean proteins, low-fat dairy, and heart-healthy fats.  Work with your health care provider or diet and nutrition specialist (dietitian) to adjust your eating plan to your individual calorie  needs. This information is not intended to replace advice given to you by your health care provider. Make sure you discuss any questions you have with your health care provider. Document Released: 12/06/2011 Document Revised: 12/10/2016 Document Reviewed: 12/10/2016 Elsevier Interactive Patient Education  2017 [place].   Hydrochlorothiazide, HCTZ capsules or tablets What is this medicine? HYDROCHLOROTHIAZIDE (hye droe klor oh THYE a zide) is a diuretic. It increases the amount of urine passed, which causes the body to lose salt and water. This medicine is used to treat high blood pressure. It is also reduces the swelling and water retention caused by various medical conditions, such as heart, liver, or kidney disease. This medicine may be used for other purposes; ask your health care provider or pharmacist if you have questions. COMMON BRAND NAME(S): Esidrix, Ezide, HydroDIURIL, Microzide, Oretic, Zide What should I tell my health care provider before I take this medicine? They need to know if you have any of these conditions: -diabetes -gout -immune system problems, like lupus -kidney disease or kidney stones -liver disease -pancreatitis -small amount of urine or difficulty passing urine -an unusual or allergic reaction to hydrochlorothiazide, sulfa drugs, other medicines, foods, dyes, or preservatives -pregnant or trying to get pregnant -breast-feeding How should I use this medicine? Take this medicine by mouth with a glass of water. Follow the directions on the prescription label. Take your medicine at regular intervals. Remember that you will need to pass urine frequently after taking this medicine. Do not take your doses at a time of day that will cause you problems. Do not stop taking your medicine unless your doctor tells you to. Talk to your pediatrician regarding the use of this medicine in children. Special care may be needed. Overdosage: If you think you have taken too much  of this medicine contact a poison control center or emergency room at once. NOTE: This medicine is only for you. Do not share this medicine with others. What if I miss a dose? If you miss a dose, take it as soon as you can. If it is almost time for your next dose, take only that dose. Do not take double or extra doses. What may interact with this medicine? -cholestyramine -colestipol -digoxin -dofetilide -lithium -medicines for blood pressure -medicines for diabetes -medicines that relax muscles for surgery -other diuretics -steroid medicines like prednisone or cortisone This list may not describe all possible interactions. Give your health care provider a list of all the medicines, herbs, non-prescription drugs, or dietary supplements you use. Also tell them if you smoke, drink alcohol, or use illegal drugs. Some items may interact with your medicine. What should I watch for while using this medicine? Visit your doctor or health care professional for regular checks on your progress. Check your blood pressure as directed. Ask your doctor or health care professional what your blood pressure should be and when you should contact him or her. You may need to be on a special diet while taking this medicine. Ask your doctor. Check with your doctor or health  care professional if you get an attack of severe diarrhea, nausea and vomiting, or if you sweat a lot. The loss of too much body fluid can make it dangerous for you to take this medicine. You may get drowsy or dizzy. Do not drive, use machinery, or do anything that needs mental alertness until you know how this medicine affects you. Do not stand or sit up quickly, especially if you are an older patient. This reduces the risk of dizzy or fainting spells. Alcohol may interfere with the effect of this medicine. Avoid alcoholic drinks. This medicine may affect your blood sugar level. If you have diabetes, check with your doctor or health care  professional before changing the dose of your diabetic medicine. This medicine can make you more sensitive to the sun. Keep out of the sun. If you cannot avoid being in the sun, wear protective clothing and use sunscreen. Do not use sun lamps or tanning beds/booths. What side effects may I notice from receiving this medicine? Side effects that you should report to your doctor or health care professional as soon as possible: -allergic reactions such as skin rash or itching, hives, swelling of the lips, mouth, tongue, or throat -changes in vision -chest pain -eye pain -fast or irregular heartbeat -feeling faint or lightheaded, falls -gout attack -muscle pain or cramps -pain or difficulty when passing urine -pain, tingling, numbness in the hands or feet -redness, blistering, peeling or loosening of the skin, including inside the mouth -unusually weak or tired Side effects that usually do not require medical attention (report to your doctor or health care professional if they continue or are bothersome): -change in sex drive or performance -dry mouth -headache -stomach upset This list may not describe all possible side effects. Call your doctor for medical advice about side effects. You may report side effects to FDA at 1-800-FDA-1088. Where should I keep my medicine? Keep out of the reach of children. Store at room temperature between 15 and 30 degrees C (59 and 86 degrees F). Do not freeze. Protect from light and moisture. Keep container closed tightly. Throw away any unused medicine after the expiration date. NOTE: This sheet is a summary. It may not cover all possible information. If you have questions about this medicine, talk to your doctor, pharmacist, or health care provider.  2018 Elsevier/Gold Standard (2010-08-11 12:57:37)

## 2017-10-24 NOTE — ED Notes (Signed)
Took patient back to room patient stated that she only wanted her "blood pressure taken and didn't want a charge". Patient stated that she would be seeing her Dr. Later today if it was still high.  Patients blood pressure was 166/75.  Patient stated that she didn't want to see a physician here that she again only wanted her blood pressure taken.

## 2017-10-24 NOTE — ED Notes (Signed)
Pt reports not wanting to see a provider, only wanted her blood pressure taken because she had just left the gym where her bp read high

## 2017-10-24 NOTE — Assessment & Plan Note (Addendum)
BP Readings from Last 3 Encounters:  10/24/17 (!) 166/78  09/16/17 140/70  08/05/17 (!) 158/94   Patient with blood pressures that are above goal. She has been wanting to avoid medical therapy, however has agreed to start management of her HTN with antihypertensives. She is interested in seeing a nutritionist and will continue to work on healthy eating patterns and exercise. - Start HCTZ 12.5 mg daily - DASH diet information provided - Refer to our nutritionist, Ms. Butch Penny Plyler, RD  - f/u in 6 weeks for BP check and BMET

## 2017-10-25 NOTE — Progress Notes (Signed)
Internal Medicine Clinic Attending  Case discussed with Dr. Patel at the time of the visit.  We reviewed the resident's history and exam and pertinent patient test results.  I agree with the assessment, diagnosis, and plan of care documented in the resident's note.  

## 2017-11-01 ENCOUNTER — Telehealth: Payer: Self-pay | Admitting: *Deleted

## 2017-11-01 ENCOUNTER — Emergency Department (HOSPITAL_COMMUNITY): Payer: BLUE CROSS/BLUE SHIELD

## 2017-11-01 ENCOUNTER — Emergency Department (HOSPITAL_COMMUNITY)
Admission: EM | Admit: 2017-11-01 | Discharge: 2017-11-01 | Disposition: A | Discharge status: Home / Self Care | Payer: BLUE CROSS/BLUE SHIELD | Attending: Emergency Medicine | Admitting: Emergency Medicine

## 2017-11-01 ENCOUNTER — Encounter (HOSPITAL_COMMUNITY): Payer: Self-pay | Admitting: Emergency Medicine

## 2017-11-01 DIAGNOSIS — K219 Gastro-esophageal reflux disease without esophagitis: Secondary | ICD-10-CM

## 2017-11-01 DIAGNOSIS — R7303 Prediabetes: Secondary | ICD-10-CM | POA: Diagnosis not present

## 2017-11-01 DIAGNOSIS — Z8639 Personal history of other endocrine, nutritional and metabolic disease: Secondary | ICD-10-CM | POA: Insufficient documentation

## 2017-11-01 DIAGNOSIS — Z79899 Other long term (current) drug therapy: Secondary | ICD-10-CM | POA: Insufficient documentation

## 2017-11-01 DIAGNOSIS — R202 Paresthesia of skin: Secondary | ICD-10-CM

## 2017-11-01 DIAGNOSIS — I10 Essential (primary) hypertension: Secondary | ICD-10-CM | POA: Diagnosis not present

## 2017-11-01 LAB — COMPREHENSIVE METABOLIC PANEL
ALK PHOS: 97 U/L (ref 38–126)
ALT: 12 U/L — ABNORMAL LOW (ref 14–54)
ANION GAP: 7 (ref 5–15)
AST: 19 U/L (ref 15–41)
Albumin: 3.7 g/dL (ref 3.5–5.0)
BUN: 12 mg/dL (ref 6–20)
CALCIUM: 9.1 mg/dL (ref 8.9–10.3)
CO2: 25 mmol/L (ref 22–32)
Chloride: 104 mmol/L (ref 101–111)
Creatinine, Ser: 0.97 mg/dL (ref 0.44–1.00)
GFR calc non Af Amer: >60 mL/min (ref >60)
Glucose, Bld: 114 mg/dL — ABNORMAL HIGH (ref 65–99)
Potassium: 4.1 mmol/L (ref 3.5–5.1)
SODIUM: 136 mmol/L (ref 135–145)
Total Bilirubin: 0.5 mg/dL (ref 0.3–1.2)
Total Protein: 7 g/dL (ref 6.5–8.1)

## 2017-11-01 LAB — I-STAT CHEM 8, ED
BUN: 12 mg/dL (ref 6–20)
CALCIUM ION: 1.17 mmol/L (ref 1.15–1.40)
CHLORIDE: 102 mmol/L (ref 101–111)
Creatinine, Ser: 0.9 mg/dL (ref 0.44–1.00)
Glucose, Bld: 112 mg/dL — ABNORMAL HIGH (ref 65–99)
HCT: 41 % (ref 36.0–46.0)
HEMOGLOBIN: 13.9 g/dL (ref 12.0–15.0)
Potassium: 4.1 mmol/L (ref 3.5–5.1)
SODIUM: 140 mmol/L (ref 135–145)
TCO2: 29 mmol/L (ref 22–32)

## 2017-11-01 LAB — DIFFERENTIAL
BASOS PCT: 0 %
Basophils Absolute: 0 10*3/uL (ref 0.0–0.1)
EOS PCT: 3 %
Eosinophils Absolute: 0.1 10*3/uL (ref 0.0–0.7)
LYMPHS PCT: 31 %
Lymphs Abs: 1.7 10*3/uL (ref 0.7–4.0)
MONO ABS: 0.3 10*3/uL (ref 0.1–1.0)
Monocytes Relative: 5 %
Neutro Abs: 3.4 10*3/uL (ref 1.7–7.7)
Neutrophils Relative %: 61 %

## 2017-11-01 LAB — I-STAT TROPONIN, ED: TROPONIN I, POC: 0 ng/mL (ref 0.00–0.08)

## 2017-11-01 LAB — TSH: TSH: 4.375 u[IU]/mL (ref 0.350–4.500)

## 2017-11-01 LAB — PROTIME-INR
INR: 0.96
PROTHROMBIN TIME: 12.7 s (ref 11.4–15.2)

## 2017-11-01 LAB — CBC
HCT: 38.2 % (ref 36.0–46.0)
Hemoglobin: 12.4 g/dL (ref 12.0–15.0)
MCH: 24.5 pg — AB (ref 26.0–34.0)
MCHC: 32.5 g/dL (ref 30.0–36.0)
MCV: 75.3 fL — AB (ref 78.0–100.0)
PLATELETS: 235 10*3/uL (ref 150–400)
RBC: 5.07 MIL/uL (ref 3.87–5.11)
RDW: 15.8 % — AB (ref 11.5–15.5)
WBC: 5.5 10*3/uL (ref 4.0–10.5)

## 2017-11-01 LAB — APTT: aPTT: 30 seconds (ref 24–36)

## 2017-11-01 MED ORDER — GI COCKTAIL ~~LOC~~
30.0000 mL | Freq: Once | ORAL | Status: AC
  Administered 2017-11-01: 30 mL via ORAL
  Filled 2017-11-01: qty 30

## 2017-11-01 MED ORDER — ASPIRIN 81 MG PO CHEW
324.0000 mg | CHEWABLE_TABLET | Freq: Once | ORAL | Status: AC
  Administered 2017-11-01: 324 mg via ORAL
  Filled 2017-11-01: qty 4

## 2017-11-01 MED ORDER — SODIUM CHLORIDE 0.9 % IV BOLUS (SEPSIS)
1000.0000 mL | Freq: Once | INTRAVENOUS | Status: AC
  Administered 2017-11-01: 1000 mL via INTRAVENOUS

## 2017-11-01 MED ORDER — PANTOPRAZOLE SODIUM 20 MG PO TBEC: 20.0000 mg | DELAYED_RELEASE_TABLET | Freq: Every day | ORAL | 0 refills | Status: DC

## 2017-11-01 MED ORDER — PANTOPRAZOLE SODIUM 40 MG PO TBEC
40.0000 mg | DELAYED_RELEASE_TABLET | Freq: Once | ORAL | Status: AC
  Administered 2017-11-01: 40 mg via ORAL
  Filled 2017-11-01: qty 1

## 2017-11-01 NOTE — ED Triage Notes (Addendum)
Pt states around 1230pm went to bed with no problems. Pt states around 130pm she was woken up with left sided hand numbness that lasted for 1 min or so. Pt states she also had a pain in the left side of her head. That pain is gone. PT states she feels like her heart is racing. Has no numbness currently, and no headache. No visual disturbances, no speech disturbances, no weakness. VAN negative.

## 2017-11-01 NOTE — Telephone Encounter (Signed)
Patient walked in front desk around 3pm, c/o L hand numbness, L sided head discomfort, heart palpitations that started around 1-2 pm. Had noticed urination changes since started on HCTZ 10/24/17. She has been taking med on & off. Noticed no urination the second day after taking it. Now stated urine output is back to normal. She appeared restless, radial pulse was 102/min. She said these symptoms are all new to her. Preferred to be seen @ ED. Patient taken to ED via w/c (was taken by triage nurse right away).

## 2017-11-01 NOTE — ED Provider Notes (Signed)
[facility]ER[facility]ENCY DEPARTMENT Provider Note   CSN: [id number] Arrival date & time: 11/01/17  1527     History   Chief Complaint Chief Complaint  Patient presents with  . Palpitations  . Numbness    HPI Tina Stone is a 67 y.o. female.  Pt presents to the ED today with left hand numbness.  Pt said she took a nap around 12:30 and woke up about 1 hour later with some numbness to her left hand.  Pt also had some pain to the left side of her head.  The pt said her arm was bent, and sx went away after she straightened it out.  The pt said sx lasted for about a minute.  She had no trouble with moving her hand or with speaking/swallowing.  She has no sx now.  Pt also felt like her heart was racing.  Pt denies cp or sob.      Past Medical History:  Diagnosis Date  . Adenomatous colon polyp   . Allergic rhinitis, seasonal   . Beta thalassemia trait 11/25/2013  . Cholelithiasis   . Class 3 obesity without serious comorbidity with body mass index (BMI) of 40.0 to 44.9 in adult 11/19/2012  . GERD (gastroesophageal reflux disease)   . HLD (hyperlipidemia)   . Hypertension 03/18/2017  . Intraductal papilloma of left breast    Patient underwent left needle-localized lumpectomy by Dr. Imogene Burn. Tsuei on 09/09/2013; pathology showed intraductal papilloma with no atypia or malignancy identified.  . Prediabetes 03/18/2017   History of elevated glucose with A1c of 5.8% in 2013. This is consistent with obesity and possible metabolic syndrome. Plan to check hemoglobin A1c today and annually.  Marland Kitchen Uterine fibroid     Patient Active Problem List   Diagnosis Date Noted  . Patellofemoral arthralgia of right knee 06/17/2017  . Prediabetes 03/18/2017  . TMJ 03/18/2017  . Hypertension 03/18/2017  . Beta thalassemia trait 11/25/2013  . Class 3 obesity without serious comorbidity with body mass index (BMI) of 40.0 to 44.9 in adult 11/19/2012  . Preventative health care  07/12/2011  . Gastroesophageal reflux disease 01/28/2007  . Hyperlipidemia 10/10/2006    Past Surgical History:  Procedure Laterality Date  . [place]   right  . BREAST EXCISIONAL BIOPSY Left 2014   benign  . BREAST LUMPECTOMY WITH NEEDLE LOCALIZATION Left 09/09/2013   Procedure: BREAST LUMPECTOMY WITH NEEDLE LOCALIZATION;  Surgeon: Imogene Burn. Georgette Dover, MD;  Location: [facility];  Service: General;  Laterality: Left;    OB History    No data available       Home Medications    Prior to Admission medications   Medication Sig Start Date End Date Taking? Authorizing Provider  hydrochlorothiazide (MICROZIDE) 12.5 MG capsule Take 1 capsule (12.5 mg total) by mouth daily. 10/24/17   Zada Finders, MD  pantoprazole (PROTONIX) 20 MG tablet Take 1 tablet (20 mg total) by mouth daily. 11/01/17   Isla Pence, MD    Family History Family History  Problem Relation Age of Onset  . Diabetes Mother   . Hypertension Mother   . Colon polyps Mother 66  . Dementia Mother 65  . Diabetes Father   . Congestive Heart Failure Father   . Pancreatic cancer Paternal Aunt   . Colon cancer Neg Hx   . Breast cancer Neg Hx   . Lung cancer Neg Hx     Social History Social History  Substance Use Topics  .  Smoking status: Never Smoker  . Smokeless tobacco: Never Used  . Alcohol use No     Allergies   Bactrim [sulfamethoxazole-trimethoprim]; Pravastatin; and Shellfish allergy   Review of Systems Review of Systems  Cardiovascular: Positive for palpitations.  Neurological: Positive for numbness.  All other systems reviewed and are negative.    Physical Exam Updated Vital Signs BP (!) 154/93   Pulse 81   Temp 98.1 F (36.7 C)   Resp 14   SpO2 100%   Physical Exam  Constitutional: She is oriented to person, place, and time. She appears well-developed and well-nourished.  HENT:  Head: Normocephalic and atraumatic.  Right Ear: External ear normal.  Left Ear:  External ear normal.  Nose: Nose normal.  Mouth/Throat: Oropharynx is clear and moist.  Eyes: Pupils are equal, round, and reactive to light. Conjunctivae and EOM are normal.  Neck: Normal range of motion. Neck supple.  Cardiovascular: Normal rate, regular rhythm, normal heart sounds and intact distal pulses.   Pulmonary/Chest: Effort normal and breath sounds normal.  Abdominal: Soft. Bowel sounds are normal.  Musculoskeletal: Normal range of motion.  Neurological: She is alert and oriented to person, place, and time.  Skin: Skin is warm. Capillary refill takes less than 2 seconds.  Psychiatric: She has a normal mood and affect. Her behavior is normal. Judgment and thought content normal.  Nursing note and vitals reviewed.    ED Treatments / Results  Labs (all labs ordered are listed, but only abnormal results are displayed) Labs Reviewed  CBC - Abnormal; Notable for the following:       Result Value   MCV 75.3 (*)    MCH 24.5 (*)    RDW 15.8 (*)    All other components within normal limits  COMPREHENSIVE METABOLIC PANEL - Abnormal; Notable for the following:    Glucose, Bld 114 (*)    ALT 12 (*)    All other components within normal limits  I-STAT CHEM 8, ED - Abnormal; Notable for the following:    Glucose, Bld 112 (*)    All other components within normal limits  PROTIME-INR  APTT  DIFFERENTIAL  TSH  I-STAT TROPONIN, ED  CBG MONITORING, ED    EKG  EKG Interpretation  Date/Time:  Friday November 01 2017 15:33:47 EDT Ventricular Rate:  105 PR Interval:  172 QRS Duration: 70 QT Interval:  330 QTC Calculation: 436 R Axis:   71 Text Interpretation:  Sinus tachycardia Cannot rule out Anterior infarct , age undetermined Abnormal ECG Confirmed by Isla Pence [phone]) on 11/01/2017 6:43:55 PM       Radiology Ct Head Wo Contrast  Result Date: 11/01/2017 CLINICAL DATA:  67 y/o  F; right hand numbness. EXAM: CT HEAD WITHOUT CONTRAST TECHNIQUE: Contiguous axial  images were obtained from the base of the skull through the vertex without intravenous contrast. COMPARISON:  None. FINDINGS: Brain: No evidence of acute infarction, hemorrhage, hydrocephalus, extra-axial collection or mass lesion/mass effect. Small lucency in left caudate head may represent a chronic lacunar infarction or prominent perivascular space. Vascular: Calcific atherosclerosis of carotid siphons. Skull: Normal. Negative for fracture or focal lesion. Sinuses/Orbits: No acute finding. Other: None. IMPRESSION: 1. No acute intracranial abnormality identified. 2. Left caudate head small chronic lacunar infarct versus prominent perivascular space. Electronically Signed   By: Kristine Garbe M.D.   On: 11/01/2017 17:21    Procedures Procedures (including critical care time)  Medications Ordered in ED Medications  gi cocktail (Maalox,Lidocaine,Donnatal) (not administered)  pantoprazole (PROTONIX) EC tablet 40 mg (not administered)  aspirin chewable tablet 324 mg (324 mg Oral Given 11/01/17 1900)  sodium chloride 0.9 % bolus 1,000 mL (1,000 mLs Intravenous New Bag/Given 11/01/17 1940)     Initial Impression / Assessment and Plan / ED Course  I have reviewed the triage vital signs and the nursing notes.  Pertinent labs & imaging results that were available during my care of the patient were reviewed by me and considered in my medical decision making (see chart for details).     Sx not c/w CVA.  They are likely from sleeping on her arm.  Pt is concerned about her GERD sx.  She's been using apple cider vinegar, but wants a rx for med.  She said that would help her. Pt given rx for protonix.   Pt knows to f/u with her pcp.  Final Clinical Impressions(s) / ED Diagnoses   Final diagnoses:  Paresthesia  Gastroesophageal reflux disease without esophagitis    New Prescriptions New Prescriptions   PANTOPRAZOLE (PROTONIX) 20 MG TABLET    Take 1 tablet (20 mg total) by mouth daily.       Isla Pence, MD 11/01/17 [phone]

## 2017-11-01 NOTE — ED Notes (Signed)
CBG 96, RN notified

## 2017-11-04 LAB — CBG MONITORING, ED: Glucose-Capillary: 96 mg/dL (ref 65–99)

## 2017-12-09 ENCOUNTER — Ambulatory Visit: Payer: BLUE CROSS/BLUE SHIELD | Admitting: Student in an Organized Health Care Education/Training Program

## 2017-12-09 ENCOUNTER — Encounter: Payer: BLUE CROSS/BLUE SHIELD | Admitting: Dietician

## 2017-12-16 ENCOUNTER — Ambulatory Visit: Payer: BLUE CROSS/BLUE SHIELD | Admitting: Student in an Organized Health Care Education/Training Program

## 2017-12-20 ENCOUNTER — Telehealth: Payer: Self-pay | Admitting: Student in an Organized Health Care Education/Training Program

## 2017-12-20 NOTE — Telephone Encounter (Signed)
Just attempted to contact patient about rescheduling her appointment with Butch Penny Plyler from 12/09/17.  No answer left detailed message asking to please call the clinic back and she could ask to speak with me.Marland Kitchen

## 2018-01-27 NOTE — Telephone Encounter (Signed)
Thank you :)

## 2018-02-10 ENCOUNTER — Encounter: Payer: Self-pay | Admitting: Dietician

## 2018-02-10 ENCOUNTER — Ambulatory Visit: Payer: PPO | Admitting: Dietician

## 2018-02-10 ENCOUNTER — Ambulatory Visit (INDEPENDENT_AMBULATORY_CARE_PROVIDER_SITE_OTHER): Payer: PPO | Admitting: Student in an Organized Health Care Education/Training Program

## 2018-02-10 ENCOUNTER — Encounter: Payer: Self-pay | Admitting: Student in an Organized Health Care Education/Training Program

## 2018-02-10 VITALS — BP 145/86 | HR 77 | Temp 97.9°F | Wt 234.6 lb

## 2018-02-10 DIAGNOSIS — Z791 Long term (current) use of non-steroidal anti-inflammatories (NSAID): Secondary | ICD-10-CM | POA: Diagnosis not present

## 2018-02-10 DIAGNOSIS — Z6841 Body Mass Index (BMI) 40.0 and over, adult: Secondary | ICD-10-CM | POA: Diagnosis not present

## 2018-02-10 DIAGNOSIS — Z79899 Other long term (current) drug therapy: Secondary | ICD-10-CM

## 2018-02-10 DIAGNOSIS — E2839 Other primary ovarian failure: Secondary | ICD-10-CM | POA: Insufficient documentation

## 2018-02-10 DIAGNOSIS — K219 Gastro-esophageal reflux disease without esophagitis: Secondary | ICD-10-CM | POA: Diagnosis not present

## 2018-02-10 DIAGNOSIS — E669 Obesity, unspecified: Secondary | ICD-10-CM

## 2018-02-10 DIAGNOSIS — M26609 Unspecified temporomandibular joint disorder, unspecified side: Secondary | ICD-10-CM | POA: Diagnosis not present

## 2018-02-10 DIAGNOSIS — H6121 Impacted cerumen, right ear: Secondary | ICD-10-CM

## 2018-02-10 DIAGNOSIS — I10 Essential (primary) hypertension: Secondary | ICD-10-CM

## 2018-02-10 DIAGNOSIS — M25561 Pain in right knee: Secondary | ICD-10-CM

## 2018-02-10 DIAGNOSIS — S0300XA Dislocation of jaw, unspecified side, initial encounter: Secondary | ICD-10-CM

## 2018-02-10 MED ORDER — OMEPRAZOLE 20 MG PO CPDR: 20.0000 mg | DELAYED_RELEASE_CAPSULE | Freq: Every day | ORAL | 1 refills | Status: DC

## 2018-02-10 NOTE — Assessment & Plan Note (Signed)
Weight down to 234 pounds today, BMI 40.  She done a really good job with weight loss of the last 2 years and is successfully maintaining that weight loss with diet and exercise.  I encouraged her to continue to make these healthy choices.  She is going to meet with our nutritionist directly after this visit.

## 2018-02-10 NOTE — Assessment & Plan Note (Signed)
Continues to have symptomatic reflux.  She was intolerant of pantoprazole because of confusion.  Patient is very sensitive to medication side effects.  Reflux symptoms are still very bothersome to her, she is asking if endoscopy was an option.  Plan is to try omeprazole 20 mg daily or every other day, whatever she can tolerate.  Advised that there are no red flags to do endoscopy now, rather only if she fails a good trial of maximum medical therapy first.

## 2018-02-10 NOTE — Patient Instructions (Signed)
Some things to try to help with your blood pressure:  Getting 7-9 hours sleep each and every day. This may take a while, but you can do it as you have made all those other changes already AND you are worth it!   If that doesn't;t work, you can try...  - decreasing the salt in your diet-  the garlic pepper seasoning and other sources of salt like crackers, use frozen vegetables intead of canned more often. Read labels for sodium and try to limit yourself to 800 mg sodium three times daily (morning time, afternoon and eveing)  - Meditation or yoga to relax you   For your REFLUX-   Consider smaller portions at mealtime and snacking or eating the other half of your meal as a snack between meals.  Decreasing the amount of oil/fat like ranch dressing, oil you use in cooking, etc... Do not lie down for 1-2 hours after eating or drinking.   Calcium Absorbtion Prilosec can prevent your body from absorbing calcium, which can lead to decreased bone density. Calcium is the structural component in bone, but it is also essential for cell signaling, which your body needs to regulate nerve impulse transmission and muscle contractions. Without enough calcium, your bones lose mass and become brittle. You can prevent this by taking calcium supplements while on Prilosec. However, Prilosec also prevents calcium carbonate from being effective, so use another calcium form, such as calcium citrate.  Folic Acid Absorbtion Many antacids, like Prilosec, inhibit your body's absorption of folic acid. You need folic acid to help make new cells, which helps your body grow and repair itself. It also helps make healthy red blood cells and prevents anemia. Add folic acid to your diet through foods like leafy green vegetables, citrus fruits and legumes. Take a dietary supplement, with a dosage between 400 and 1,000 micrograms.  Vitamin B-12 Prilosec can inhibit your body's absorption of vitamin B-12 from food, However, you can  absorb B-12 supplements. Sufficient B-12 levels are important to make hemoglobin, the component of red blood cells that carries oxygen. B-12 also helps your body metabolize energy from proteins and protects your nerves from damage. Take a dose between 10 and 50 micrograms daily.  So nice to meet you today!  Call anytime,  Butch Penny [phone]

## 2018-02-10 NOTE — Assessment & Plan Note (Signed)
Right knee pain is persistent but only mild.  Not function limiting.  Plan to continue with as needed NSAIDs and supportive care.

## 2018-02-10 NOTE — Assessment & Plan Note (Signed)
Blood pressure above goal today.  She continues to decline medication management.  She was to continue with lifestyle modifications and weight loss.  She could take home blood pressure cuff readings and bring in her cuff at next visit so can correlate with ours.  Possible she has whitecoat hypertension so getting ambulatory readings might be helpful.

## 2018-02-10 NOTE — Assessment & Plan Note (Signed)
Being managed by her dentist.  She is asking me if her general feelings have mercury and if she is having pain in her jaw from a reaction to that.  I advised her to follow-up closely with her dentist as that person with no best the components and possible side effects of fillings.

## 2018-02-10 NOTE — Progress Notes (Signed)
  Medical Nutrition Therapy:  Appt start time: 0930 end time:  1020. Visit # 1  Assessment:  Primary concerns today: blood pressure Ms. Mccue desires help with her blood pressure. She eats 3-4 servings of fruit and 3-4 servings of vegetables a day with whole grains, nuts and beans. She has been gradually decreasing her weight and also complains of GERD problems. She exercises at least 3 days a week for 60 minutes, but her blood pressure remains elevated. She works 3 nights a week from 8 PM to 8 AM and on those nights may sleep a total of 4 hours (2 hours at a time) total.   BP Readings from Last 3 Encounters:  02/10/18 (!) 145/86  11/01/17 (!) 154/90  10/24/17 (!) 166/78    Preferred Learning Style: No preference indicated  Learning Readiness:Ready /Change in progress  ANTHROPOMETRICS: weight-234.6#, BMI-40 WEIGHT HISTORY: Highest:254# Lowest-234# SLEEP: 8 hours sleep 3-4 nights a week , the other nights may be a total of 4 hours broken into two 2 hour naps  MEDICATIONS: prilosec daily may inhibit absorption of nutrients- folic acid, Q76, calcium  DIETARY INTAKE: Usual eating pattern includes 3 meals and 3 snacks per day. Everyday foods include fruits, vegetables, nuts, meat, whole grains.  Avoided foods include oranges, bananas, tomatoes, onioins.  Food Intolerances: onions, oranges, bananas, tomatoes Constipation: no problems with bowels Dining Out (times/week): a few times week to [employer] for salad 24-hr recall:  Arises 5 am- drinks homemade ginger tea & beet juice x 2 tablespoons B ( AM): 2 cups raisin bran & ~ 1 cup almond milk Snk ( AM): pistachio nuts  L ( PM): large salad with ranch dressing and whole grain club crackers or meat, vegetables and starch Snk ( PM): graham crackers D ( PM): [place], chicken or fish( never fried), vegetables, sweet potato Snk ( PM): homemade cool whip and yogurt pie with  Beverages: tea, beet juice, water  Usual physical activity: swims 3  days a week, walks/run at [employer] at least one day a week and 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   Nutritional Diagnosis:  NI-5.10.2 Excessive mineral intake (specify): sodium As related to canned foods and seasoning salt use.  As evidenced by her increased blood pressure.    Intervention:  Nutrition education about healthy lifestyle to assist with lowering blood pressure including- healthy diet, healthy amounts of sleep, lowering her sodium intake, decreasing GERD symptoms and being sure to take sufficient calcium, folic acid and vitamin B12. Coordination of care:   Teaching Method Utilized: Visual, Auditory, Hands on Handouts given during visit include:importance of potassium,  Barriers to learning/adherence to lifestyle change: competing values Demonstrated degree of understanding via:  Teach Back   Monitoring/Evaluation:  Dietary intake, exercise, and body weight prn. Debera Lat, RD Diabetes Educator 02/10/2018 11:38 AM.

## 2018-02-10 NOTE — Assessment & Plan Note (Signed)
Age greater than 21 years in postmenopausal woman, we discussed benefits of screening for osteoporosis, patient is amenable, I have ordered a DEXA scan.

## 2018-02-10 NOTE — Progress Notes (Signed)
   Assessment and Plan:  See Encounters tab for problem-based medical decision making.   __________________________________________________________  HPI:   68 year old woman here for follow-up of hypertension.  Over the last few years the patient has had stage II hypertension with systolics consistently over 140.  I have recommended medication intervention she has consistently declined and instead preferring only lifestyle modifications.  She came to the acute care clinic for headache in October and they were able to convince her to start HCTZ.  She took this medication for about 2 days and then noticed that she had a decrease in urination so she discontinued the medication.  She reports her blood pressure was well controlled when she was at the dentist office recently, systolic around 811, she thinks that it is only when she comes to this clinic visit blood pressure is high.  She does not check blood pressures at home right now, though she remembers that we have talked about this in the past.  She had one emergency department visit for sudden onset paresthesia of her left arm, blood work was normal, symptoms resolved when her blood pressure improved, and she was discharged without further testing.  She reports good exertional capacity, still exercising at the gym a couple times a week.  No chest pain or chest pressure.  No recent fevers or chills.  No shortness of breath, DOE, orthopnea, PND.  She brought a list of questions for [place] to discuss, we talked about intermittent leg cramps that last for about 1 minute and resolved with mustard.  She asked about beet root juice, I warned about high sugar contents.  She has been on probiotics which I think are fine.  She has needed if her dental amalgam might be leaking potentially toxic mercury into her system, I informed her that I am not an expert on this issue but it appears the FDA has certify that this form of elemental mercury in dental fillings is safe.   She complained about right ear stuffiness, which was impacted with wax and we were able to remove with irrigation.  __________________________________________________________  Problem List: Patient Active Problem List   Diagnosis Date Noted  . Hypertension 03/18/2017    Priority: High  . Obesity 11/19/2012    Priority: High  . Prediabetes 03/18/2017    Priority: Medium  . Gastroesophageal reflux disease 01/28/2007    Priority: Medium  . Hyperlipidemia 10/10/2006    Priority: Medium  . Patellofemoral arthralgia of right knee 06/17/2017    Priority: Low  . TMJ 03/18/2017    Priority: Low  . Beta thalassemia trait 11/25/2013    Priority: Low  . Preventative health care 07/12/2011    Priority: Low  . Estrogen deficiency 02/10/2018    Medications: Reconciled today in Epic __________________________________________________________  Physical Exam:  Vital Signs: Vitals:   02/10/18 0837  BP: (!) 145/86  Pulse: 77  Temp: 97.9 F (36.6 C)  SpO2: 100%  Weight: 234 lb 9.6 oz (106.4 kg)    Gen: Well appearing, NAD ENT: OP clear without erythema or exudate. Right ear impacted with wax. Neck: No cervical LAD, No thyromegaly or nodules, No JVD. CV: RRR, no murmurs Pulm: Normal effort, CTA throughout, no wheezing Ext: Warm, no edema, normal joints

## 2018-02-24 ENCOUNTER — Encounter (HOSPITAL_COMMUNITY): Payer: Self-pay | Admitting: *Deleted

## 2018-02-24 ENCOUNTER — Emergency Department (HOSPITAL_COMMUNITY): Payer: PPO

## 2018-02-24 ENCOUNTER — Other Ambulatory Visit: Payer: Self-pay

## 2018-02-24 ENCOUNTER — Emergency Department (HOSPITAL_COMMUNITY)
Admission: EM | Admit: 2018-02-24 | Discharge: 2018-02-24 | Disposition: A | Discharge status: Home / Self Care | Payer: PPO | Attending: Emergency Medicine | Admitting: Emergency Medicine

## 2018-02-24 DIAGNOSIS — Z8639 Personal history of other endocrine, nutritional and metabolic disease: Secondary | ICD-10-CM | POA: Diagnosis not present

## 2018-02-24 DIAGNOSIS — R200 Anesthesia of skin: Secondary | ICD-10-CM | POA: Diagnosis not present

## 2018-02-24 DIAGNOSIS — R202 Paresthesia of skin: Secondary | ICD-10-CM | POA: Diagnosis not present

## 2018-02-24 DIAGNOSIS — I10 Essential (primary) hypertension: Secondary | ICD-10-CM | POA: Diagnosis not present

## 2018-02-24 DIAGNOSIS — Z79899 Other long term (current) drug therapy: Secondary | ICD-10-CM | POA: Insufficient documentation

## 2018-02-24 DIAGNOSIS — R519 Headache, unspecified: Secondary | ICD-10-CM

## 2018-02-24 DIAGNOSIS — R51 Headache: Secondary | ICD-10-CM | POA: Insufficient documentation

## 2018-02-24 LAB — CBC
HEMATOCRIT: 37.4 % (ref 36.0–46.0)
HEMOGLOBIN: 12.3 g/dL (ref 12.0–15.0)
MCH: 24.8 pg — ABNORMAL LOW (ref 26.0–34.0)
MCHC: 32.9 g/dL (ref 30.0–36.0)
MCV: 75.6 fL — ABNORMAL LOW (ref 78.0–100.0)
PLATELETS: 237 10*3/uL (ref 150–400)
RBC: 4.95 MIL/uL (ref 3.87–5.11)
RDW: 15.2 % (ref 11.5–15.5)
WBC: 5.1 10*3/uL (ref 4.0–10.5)

## 2018-02-24 LAB — COMPREHENSIVE METABOLIC PANEL
ALK PHOS: 87 U/L (ref 38–126)
ALT: 13 U/L — ABNORMAL LOW (ref 14–54)
AST: 20 U/L (ref 15–41)
Albumin: 3.5 g/dL (ref 3.5–5.0)
Anion gap: 10 (ref 5–15)
BILIRUBIN TOTAL: 0.7 mg/dL (ref 0.3–1.2)
BUN: 11 mg/dL (ref 6–20)
CALCIUM: 9.1 mg/dL (ref 8.9–10.3)
CO2: 24 mmol/L (ref 22–32)
Chloride: 103 mmol/L (ref 101–111)
Creatinine, Ser: 0.9 mg/dL (ref 0.44–1.00)
GFR calc non Af Amer: >60 mL/min (ref >60)
Glucose, Bld: 98 mg/dL (ref 65–99)
POTASSIUM: 3.7 mmol/L (ref 3.5–5.1)
SODIUM: 137 mmol/L (ref 135–145)
TOTAL PROTEIN: 7 g/dL (ref 6.5–8.1)

## 2018-02-24 LAB — DIFFERENTIAL
Basophils Absolute: 0 10*3/uL (ref 0.0–0.1)
Basophils Relative: 0 %
EOS ABS: 0.1 10*3/uL (ref 0.0–0.7)
Eosinophils Relative: 1 %
LYMPHS ABS: 1 10*3/uL (ref 0.7–4.0)
LYMPHS PCT: 19 %
MONO ABS: 0.3 10*3/uL (ref 0.1–1.0)
Monocytes Relative: 5 %
Neutro Abs: 3.8 10*3/uL (ref 1.7–7.7)
Neutrophils Relative %: 75 %

## 2018-02-24 LAB — PROTIME-INR
INR: 1
Prothrombin Time: 13.1 seconds (ref 11.4–15.2)

## 2018-02-24 LAB — APTT: APTT: 29 s (ref 24–36)

## 2018-02-24 LAB — I-STAT CHEM 8, ED
BUN: 12 mg/dL (ref 6–20)
CALCIUM ION: 1.14 mmol/L — AB (ref 1.15–1.40)
Chloride: 102 mmol/L (ref 101–111)
Creatinine, Ser: 0.9 mg/dL (ref 0.44–1.00)
Glucose, Bld: 101 mg/dL — ABNORMAL HIGH (ref 65–99)
HCT: 39 % (ref 36.0–46.0)
HEMOGLOBIN: 13.3 g/dL (ref 12.0–15.0)
Potassium: 3.7 mmol/L (ref 3.5–5.1)
Sodium: 139 mmol/L (ref 135–145)
TCO2: 27 mmol/L (ref 22–32)

## 2018-02-24 LAB — I-STAT TROPONIN, ED: Troponin i, poc: 0.01 ng/mL (ref 0.00–0.08)

## 2018-02-24 MED ORDER — PANTOPRAZOLE SODIUM 40 MG PO TBEC
40.0000 mg | DELAYED_RELEASE_TABLET | Freq: Once | ORAL | Status: AC
  Administered 2018-02-24: 40 mg via ORAL
  Filled 2018-02-24: qty 1

## 2018-02-24 MED ORDER — CYCLOBENZAPRINE HCL 10 MG PO TABS
5.0000 mg | ORAL_TABLET | Freq: Once | ORAL | Status: AC
  Administered 2018-02-24: 5 mg via ORAL
  Filled 2018-02-24: qty 1

## 2018-02-24 NOTE — ED Notes (Signed)
Pt stable, ambulatory, and verbalizes understanding of d/c instructions.  

## 2018-02-24 NOTE — Discharge Instructions (Signed)
Take tylenol, motrin for headaches.   Get enough rest at night.   See your doctor. See neurologist if you have persistent headaches   Return to ER if you have worse headaches, vomiting, weakness, numbness

## 2018-02-24 NOTE — ED Triage Notes (Signed)
Pt woke up this morning with a frontal headache that has since resolved. Pain is now located to R posterior head and R side of neck. Also reports intermittent numbness to L arm and face that has since resolved. Denies hx of migraines or stroke. No neuro deficits at present

## 2018-02-24 NOTE — ED Notes (Signed)
ED Provider at bedside. 

## 2018-02-24 NOTE — ED Provider Notes (Signed)
[facility] Provider Note   CSN: [id number] Arrival date & time: 02/24/18  0603     History   Chief Complaint Chief Complaint  Patient presents with  . Headache  . Numbness    HPI Tina Stone is a 68 y.o. female history of hypertension, hyperlipidemia, here presenting with headache, left arm numbness.  Patient states that this morning she woke up and had right-sided headache associated with left arm numbness.  States that it started with right frontal headache now migrated to the right upper neck.  Denies any blurry vision or loss of vision denies any neck stiffness or fevers.  Patient had some left-sided arm numbness at that time now has completely resolved.  Denies any history of cerebral aneurysms.   The history is provided by the patient.    Past Medical History:  Diagnosis Date  . Adenomatous colon polyp   . Allergic rhinitis, seasonal   . Beta thalassemia trait 11/25/2013  . Cholelithiasis   . Class 3 obesity without serious comorbidity with body mass index (BMI) of 40.0 to 44.9 in adult 11/19/2012  . GERD (gastroesophageal reflux disease)   . HLD (hyperlipidemia)   . Hypertension 03/18/2017  . Intraductal papilloma of left breast    Patient underwent left needle-localized lumpectomy by Dr. Imogene Burn. Tsuei on 09/09/2013; pathology showed intraductal papilloma with no atypia or malignancy identified.  . Prediabetes 03/18/2017   History of elevated glucose with A1c of 5.8% in 2013. This is consistent with obesity and possible metabolic syndrome. Plan to check hemoglobin A1c today and annually.  Marland Kitchen Uterine fibroid     Patient Active Problem List   Diagnosis Date Noted  . Estrogen deficiency 02/10/2018  . Patellofemoral arthralgia of right knee 06/17/2017  . Prediabetes 03/18/2017  . TMJ 03/18/2017  . Hypertension 03/18/2017  . Beta thalassemia trait 11/25/2013  . Obesity 11/19/2012  . Preventative health care 07/12/2011  .  Gastroesophageal reflux disease 01/28/2007  . Hyperlipidemia 10/10/2006    Past Surgical History:  Procedure Laterality Date  . [place]   right  . BREAST EXCISIONAL BIOPSY Left 2014   benign  . BREAST LUMPECTOMY WITH NEEDLE LOCALIZATION Left 09/09/2013   Procedure: BREAST LUMPECTOMY WITH NEEDLE LOCALIZATION;  Surgeon: Imogene Burn. Georgette Dover, MD;  Location: [facility];  Service: General;  Laterality: Left;    OB History    No data available       Home Medications    Prior to Admission medications   Medication Sig Start Date End Date Taking? Authorizing Provider  omeprazole (PRILOSEC) 20 MG capsule Take 1 capsule (20 mg total) by mouth daily. 02/10/18 02/10/19 Yes Axel Filler, MD    Family History Family History  Problem Relation Age of Onset  . Diabetes Mother   . Hypertension Mother   . Colon polyps Mother 31  . Dementia Mother 74  . Diabetes Father   . Congestive Heart Failure Father   . Pancreatic cancer Paternal Aunt   . Colon cancer Neg Hx   . Breast cancer Neg Hx   . Lung cancer Neg Hx     Social History Social History   Tobacco Use  . Smoking status: Never Smoker  . Smokeless tobacco: Never Used  Substance Use Topics  . Alcohol use: No    Alcohol/week: 0.0 oz  . Drug use: No     Allergies   Bactrim [sulfamethoxazole-trimethoprim]; Pravastatin; and Shellfish allergy   Review of Systems Review  of Systems  Neurological: Positive for numbness and headaches.  All other systems reviewed and are negative.    Physical Exam Updated Vital Signs BP (!) 166/88 (BP Location: Right Arm)   Pulse 95   Temp 98.2 F (36.8 C) (Oral)   Resp 18   SpO2 98%   Physical Exam  Constitutional: She is oriented to person, place, and time. She appears well-developed.  HENT:  Head: Normocephalic.  Mouth/Throat: Oropharynx is clear and moist.  Eyes: EOM are normal. Pupils are equal, round, and reactive to light.  Neck: Normal range of motion.  Neck supple.  R upper paracervical tenderness. Nl ROM, no meningeal signs   Cardiovascular: Normal rate, regular rhythm and normal heart sounds.  Pulmonary/Chest: Effort normal and breath sounds normal.  Abdominal: Soft. Bowel sounds are normal.  Musculoskeletal: Normal range of motion.  Neurological: She is alert and oriented to person, place, and time. She has normal strength.  CN 2-12 intact, nl strength throughout, no pronator drift, nl finger to nose, nl gait   Skin: Skin is warm.  Psychiatric: She has a normal mood and affect.  Nursing note and vitals reviewed.    ED Treatments / Results  Labs (all labs ordered are listed, but only abnormal results are displayed) Labs Reviewed  CBC - Abnormal; Notable for the following components:      Result Value   MCV 75.6 (*)    MCH 24.8 (*)    All other components within normal limits  COMPREHENSIVE METABOLIC PANEL - Abnormal; Notable for the following components:   ALT 13 (*)    All other components within normal limits  I-STAT CHEM 8, ED - Abnormal; Notable for the following components:   Glucose, Bld 101 (*)    Calcium, Ion 1.14 (*)    All other components within normal limits  PROTIME-INR  APTT  DIFFERENTIAL  I-STAT TROPONIN, ED  CBG MONITORING, ED    EKG  EKG Interpretation None       Radiology Ct Head Wo Contrast  Result Date: 02/24/2018 CLINICAL DATA:  Woke up with headache and pain and right-sided neck. Numbness in the left arm and hand. EXAM: CT HEAD WITHOUT CONTRAST TECHNIQUE: Contiguous axial images were obtained from the base of the skull through the vertex without intravenous contrast. COMPARISON:  11/01/2017 FINDINGS: Brain: No evidence of acute infarction, hemorrhage, hydrocephalus, extra-axial collection or mass lesion/mass effect. Stable low-density in the left caudate head best seen on coronal reformats. As noted previously this could be dilated perivascular space or remote lacunar infarct. Vascular: No  hyperdense vessel or unexpected calcification. Skull: Normal. Negative for fracture or focal lesion. Sinuses/Orbits: No acute finding. IMPRESSION: No acute finding or change from 11/01/2017. Electronically Signed   By: Monte Fantasia M.D.   On: 02/24/2018 08:15    Procedures Procedures (including critical care time)  Medications Ordered in ED Medications  pantoprazole (PROTONIX) EC tablet 40 mg (40 mg Oral Given 02/24/18 0810)  cyclobenzaprine (FLEXERIL) tablet 5 mg (5 mg Oral Given 02/24/18 0810)     Initial Impression / Assessment and Plan / ED Course  I have reviewed the triage vital signs and the nursing notes.  Pertinent labs & imaging results that were available during my care of the patient were reviewed by me and considered in my medical decision making (see chart for details).    Buffie Herne is a 68 y.o. female here with headache, numbness that resolved. Low suspicion for [facility] or stroke. Likely complex migraine. She  is within 6 hr window so if CT head negative, will not do LP. Will give migraine cocktail, flexeril for neck muscle pain.   9:19 AM Felt better. Labs and CT head unremarkable. 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 No numbness or weakness currently. Recommend tylenol, motrin for headaches. Neurology outpatient if she has persistent headaches.    Final Clinical Impressions(s) / ED Diagnoses   Final diagnoses:  None    ED Discharge Orders    None       Drenda Freeze, MD 02/24/18 [phone]

## 2018-04-01 ENCOUNTER — Other Ambulatory Visit: Payer: Self-pay

## 2018-04-01 ENCOUNTER — Ambulatory Visit (INDEPENDENT_AMBULATORY_CARE_PROVIDER_SITE_OTHER): Payer: PPO | Admitting: Internal Medicine

## 2018-04-01 DIAGNOSIS — B9789 Other viral agents as the cause of diseases classified elsewhere: Secondary | ICD-10-CM | POA: Insufficient documentation

## 2018-04-01 DIAGNOSIS — J069 Acute upper respiratory infection, unspecified: Secondary | ICD-10-CM | POA: Diagnosis not present

## 2018-04-01 NOTE — Assessment & Plan Note (Signed)
Patient complaints of cough and congestion 3 days. Reports her mother and sister were recently sick with similar symptoms that have resolved. She denies fevers and shortness of breath. She reports a similar illness 2 years ago for which she was prescribed guaifenesin-codeine syrup. She has the prescription with her, the bottle is mostly full. The prescription expired in 2017 and she is requesting a new prescription. Informed patient that prescription will not harm her, although may be slightly less efficacious, should still be safe to take. Instructed her to use her old prescription PRN until symptoms improved. -- Guaifenesin - codeine syrup q6h PRN (old prescription)  -- Work note provided -- Follow up as needed

## 2018-04-01 NOTE — Progress Notes (Signed)
   CC: cough, congestion   HPI:  Tina Stone is a 68 y.o. female with past medical history outlined below here for cough & congestion. For the details of today's visit, please refer to the assessment and plan.  Past Medical History:  Diagnosis Date  . Adenomatous colon polyp   . Allergic rhinitis, seasonal   . Beta thalassemia trait 11/25/2013  . Cholelithiasis   . Class 3 obesity without serious comorbidity with body mass index (BMI) of 40.0 to 44.9 in adult 11/19/2012  . GERD (gastroesophageal reflux disease)   . HLD (hyperlipidemia)   . Hypertension 03/18/2017  . Intraductal papilloma of left breast    Patient underwent left needle-localized lumpectomy by Dr. Imogene Burn. Tsuei on 09/09/2013; pathology showed intraductal papilloma with no atypia or malignancy identified.  . Prediabetes 03/18/2017   History of elevated glucose with A1c of 5.8% in 2013. This is consistent with obesity and possible metabolic syndrome. Plan to check hemoglobin A1c today and annually.  Marland Kitchen Uterine fibroid     Review of Systems  Constitutional: Negative for fever.  HENT: Positive for congestion.   Respiratory: Positive for cough. Negative for shortness of breath.     Physical Exam:  Vitals:   04/01/18 0922  BP: 138/70  Pulse: 78  Temp: 98.2 F (36.8 C)  TempSrc: Oral  SpO2: 100%  Weight: 234 lb 11.2 oz (106.5 kg)  Height: 5\' 4"  (1.626 m)    Constitutional: NAD, appears comfortable HEENT: Normal oropharynx, normal tympanic membranes bilaterally  Neck: Supple, trachea midline. No lymphadenopathy.  Cardiovascular: RRR, no murmurs, rubs, or gallops.  Pulmonary/Chest: CTAB, no wheezes, rales, or rhonchi.  Psychiatric: Normal mood and affect  Assessment & Plan:   See Encounters Tab for problem based charting.  Patient discussed with Dr. Lynnae January

## 2018-04-01 NOTE — Patient Instructions (Addendum)
Tina Stone,  It was a pleasure to meet you. You may take your cough syrup every 6 hours as needed for your cough. Please get plenty of rest and stay hydrated. I hope you feel better! If you do not improve after a week, please return for follow up.  Please keep your scheduled appointment with your PCP on 05/12/18.   If you have any questions or concerns, call our clinic at [phone] or after hours call [phone] and ask for the internal medicine resident on call. Thank you!  - Dr. Philipp Ovens

## 2018-04-01 NOTE — Progress Notes (Signed)
Internal Medicine Clinic Attending  Case discussed with Dr. Guilloud at the time of the visit.  We reviewed the resident's history and exam and pertinent patient test results.  I agree with the assessment, diagnosis, and plan of care documented in the resident's note.  

## 2018-04-04 DIAGNOSIS — H52223 Regular astigmatism, bilateral: Secondary | ICD-10-CM | POA: Diagnosis not present

## 2018-04-04 DIAGNOSIS — H35033 Hypertensive retinopathy, bilateral: Secondary | ICD-10-CM | POA: Diagnosis not present

## 2018-04-04 DIAGNOSIS — H2513 Age-related nuclear cataract, bilateral: Secondary | ICD-10-CM | POA: Diagnosis not present

## 2018-04-04 DIAGNOSIS — H25013 Cortical age-related cataract, bilateral: Secondary | ICD-10-CM | POA: Diagnosis not present

## 2018-04-04 DIAGNOSIS — I7090 Unspecified atherosclerosis: Secondary | ICD-10-CM | POA: Diagnosis not present

## 2018-04-04 DIAGNOSIS — H35363 Drusen (degenerative) of macula, bilateral: Secondary | ICD-10-CM | POA: Diagnosis not present

## 2018-05-12 ENCOUNTER — Ambulatory Visit (INDEPENDENT_AMBULATORY_CARE_PROVIDER_SITE_OTHER): Payer: PPO | Admitting: Student in an Organized Health Care Education/Training Program

## 2018-05-12 ENCOUNTER — Encounter: Payer: Self-pay | Admitting: Student in an Organized Health Care Education/Training Program

## 2018-05-12 ENCOUNTER — Other Ambulatory Visit: Payer: Self-pay

## 2018-05-12 VITALS — BP 136/70 | HR 71 | Ht 64.0 in | Wt 236.7 lb

## 2018-05-12 DIAGNOSIS — K219 Gastro-esophageal reflux disease without esophagitis: Secondary | ICD-10-CM

## 2018-05-12 DIAGNOSIS — I10 Essential (primary) hypertension: Secondary | ICD-10-CM | POA: Diagnosis not present

## 2018-05-12 DIAGNOSIS — Z79899 Other long term (current) drug therapy: Secondary | ICD-10-CM

## 2018-05-12 DIAGNOSIS — Z23 Encounter for immunization: Secondary | ICD-10-CM | POA: Diagnosis not present

## 2018-05-12 MED ORDER — LISINOPRIL 10 MG PO TABS
10.0000 mg | ORAL_TABLET | Freq: Every day | ORAL | 3 refills | Status: DC
Start: 2018-05-12 — End: 2018-07-14

## 2018-05-12 MED FILL — LISINOPRIL 10 MG TABLET: 10 | 90 days supply | Qty: 90 | Fill #0

## 2018-05-12 NOTE — Patient Instructions (Signed)
Today for your elevated blood pressure we are going to start a low-dose of a medication to lower the blood pressure.  We talked about the rare side effects, but overall this is an effective medication in helping to prevent heart attacks and strokes.  Please give me a call if you have any problems with the medication and can always try something else.

## 2018-05-12 NOTE — Progress Notes (Signed)
   Assessment and Plan:  See Encounters tab for problem-based medical decision making.   __________________________________________________________  HPI:   68 year old woman here for follow-up of hypertension.  Patient reports doing very well over the last few months.  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  She reports some inconsistent blood pressure readings outside of the visit.  Says at the dentist office her blood pressures usually range around 120 - 130.  Does not check blood pressures at home.  has been exercising with water aerobics.  Denies chest pain or dyspnea on exertion.  Does not currently take any active medications.  Also complaining of some increasing burning chest discomfort of the right side of her chest only after eating meals.  Says is a little bit different than her past reflux symptoms.  It improves with using ginger tea.  Does not feel this chest pain with exertion.  It goes away after she burps, only lasts a few hours.  No unintentional weight loss, no early satiety, no vomiting, no night sweats.   __________________________________________________________  Problem List: Patient Active Problem List   Diagnosis Date Noted  . Hypertension 03/18/2017    Priority: High  . Obesity 11/19/2012    Priority: High  . Prediabetes 03/18/2017    Priority: Medium  . Gastroesophageal reflux disease 01/28/2007    Priority: Medium  . Hyperlipidemia 10/10/2006    Priority: Medium  . Beta thalassemia trait 11/25/2013    Priority: Low  . Preventative health care 07/12/2011    Priority: Low    Medications: Reconciled today in Epic __________________________________________________________  Physical Exam:  Vital Signs: Vitals:   05/12/18 0850 05/12/18 0912  BP: (!) 179/87 136/70  Pulse: 72 71  SpO2: 100%   Weight: 236 lb 11.2 oz (107.4 kg)   Height: 5\' 4"  (1.626 m)     Gen: Well appearing, NAD ENT: OP clear without erythema or exudate.  Good dentition. Neck: No cervical LAD, No thyromegaly or nodules, No JVD. CV: RRR, no murmurs Pulm: Normal effort, CTA throughout, no wheezing Abd: Soft, NT, ND. Ext: Warm, no edema, normal joints Skin: No atypical appearing moles. No rashes

## 2018-05-12 NOTE — Assessment & Plan Note (Addendum)
Blood pressure very elevated when walking into the clinic.  On recheck after sitting for a while it came back to only moderately elevated at 709 systolic.  We have been working on her hypertension for a while, unfortunately lifestyle interventions have not worked over the last few years.  She is more amenable to medication intervention today.  We try to thiazide diuretic last year but she had side effects and discontinued.  Plan to start lisinopril 10 mg once daily today.  We talked about the side effects including dry cough and angioedema.  Patient is amenable to trying, call me with side effects, she is very sensitive to medications so this is higher risk than average.  Follow-up in 2 months for renal function check.

## 2018-05-12 NOTE — Addendum Note (Signed)
Addended by: Marcelino Duster on: 05/12/2018 11:15 AM   Modules accepted: Orders, SmartSet

## 2018-05-12 NOTE — Assessment & Plan Note (Signed)
Persistent reflux symptoms, mild with no red flags.  We will use the PPI in the past, but she is been unable to tolerate this due to perceived side effects.  I recommended simple over-the-counter agents like calcium carbonate, famotidine, she will try those. If no relief we can retry a PPI. I advised EGD in the future if we can't control symptoms with good medication management.

## 2018-05-14 ENCOUNTER — Ambulatory Visit: Payer: PPO | Admitting: Student in an Organized Health Care Education/Training Program

## 2018-06-10 ENCOUNTER — Encounter: Payer: Self-pay | Admitting: Internal Medicine

## 2018-06-10 ENCOUNTER — Ambulatory Visit (INDEPENDENT_AMBULATORY_CARE_PROVIDER_SITE_OTHER): Payer: PPO | Admitting: Internal Medicine

## 2018-06-10 DIAGNOSIS — W1849XA Other slipping, tripping and stumbling without falling, initial encounter: Secondary | ICD-10-CM

## 2018-06-10 DIAGNOSIS — W2209XA Striking against other stationary object, initial encounter: Secondary | ICD-10-CM

## 2018-06-10 DIAGNOSIS — S0990XA Unspecified injury of head, initial encounter: Secondary | ICD-10-CM | POA: Diagnosis not present

## 2018-06-10 NOTE — Patient Instructions (Addendum)
It was a pleasure to see you [name].  You have a little bit of soft tissue inflammation after bumping your head. This should resolve on its own without issue.  You can try over the counter anti-inflammatory medicine like Ibuprofen (Advil) for pain and swelling as needed.  Please be sure to keep enough lights on in the house and wear proper foot wear to avoid falls.  Please follow up with Dr. Evette Doffing in about 1 month as planned.

## 2018-06-10 NOTE — Progress Notes (Signed)
   CC: Bump on head  HPI:  [name] is a 68 y.o. female with PMH as listed below who presents for evaluation after a slip and bump to her head.  Minor head injury without loss of consciousness [name] states that she was getting up around 4 AM this morning wishing to go to the gym.  She went into her kitchen which was poorly lit and tripped on a rug while wearing flip flops.  She lost her balance but was able to brace herself at the edge of the stove but bumped her head on the exhaust fan above the stove.  She had no symptoms prior to her loss of balance.  She noticed a small bump on the right side of her forehead which was tender, but denies any headache, change in vision, lightheadedness, dizziness, or loss of consciousness.  She did not notice any open wound or bleeding.  She is currently not taking any medications including lisinopril or any blood thinners. A/P: She had a minor head injury from bumping her head on the edge of a stove exhaust fan after a mechanical loss of balance.  She has a small area of tender soft tissue swelling which is minimally raised at the right side of her forehead without any open wound or bruising.  She is otherwise asymptomatic.  I recommended conservative management with as needed over-the-counter NSAIDs.   Past Medical History:  Diagnosis Date  . Adenomatous colon polyp   . Allergic rhinitis, seasonal   . Beta thalassemia trait 11/25/2013  . Cholelithiasis   . Class 3 obesity without serious comorbidity with body mass index (BMI) of 40.0 to 44.9 in adult 11/19/2012  . GERD (gastroesophageal reflux disease)   . HLD (hyperlipidemia)   . Hypertension 03/18/2017  . Intraductal papilloma of left breast    Patient underwent left needle-localized lumpectomy by Dr. Imogene Burn. Tsuei on 09/09/2013; pathology showed intraductal papilloma with no atypia or malignancy identified.  . Prediabetes 03/18/2017   History of elevated glucose with A1c of 5.8% in 2013.  This is consistent with obesity and possible metabolic syndrome. Plan to check hemoglobin A1c today and annually.  Marland Kitchen Uterine fibroid    Review of Systems:   Review of Systems  Constitutional: Negative for diaphoresis.  Eyes: Negative for blurred vision and double vision.  Respiratory: Negative for shortness of breath.   Cardiovascular: Negative for chest pain and palpitations.  Musculoskeletal: Negative for falls.  Neurological: Negative for dizziness and loss of consciousness.    Physical Exam:  Vitals:   06/10/18 1034  BP: 132/83  Pulse: 67  Temp: 98.2 F (36.8 C)  TempSrc: Oral  SpO2: 100%  Weight: 234 lb 8 oz (106.4 kg)   Physical Exam  Constitutional: She is oriented to person, place, and time. She appears well-developed and well-nourished. No distress.  HENT:  ~1.5 cm minimally raised soft tissue swelling on right side of forehead tender to touch. No open wound or bruising.  Eyes: Pupils are equal, round, and reactive to light. EOM are normal.  Cardiovascular: Normal rate and regular rhythm.  No murmur heard. Pulmonary/Chest: Effort normal. No respiratory distress. She has no wheezes. She has no rales.  Musculoskeletal: Normal range of motion. She exhibits no edema.  Neurological: She is alert and oriented to person, place, and time.  Skin: She is not diaphoretic.     Assessment & Plan:   See Encounters Tab for problem based charting.  Patient discussed with Dr. Rebeca Alert

## 2018-06-10 NOTE — Assessment & Plan Note (Signed)
Tina Stone states that she was getting up around 4 AM this morning wishing to go to the gym.  She went into her kitchen which was poorly lit and tripped on a rug while wearing flip flops.  She lost her balance but was able to brace herself at the edge of the stove but bumped her head on the exhaust fan above the stove.  She had no symptoms prior to her loss of balance.  She noticed a small bump on the right side of her forehead which was tender, but denies any headache, change in vision, lightheadedness, dizziness, or loss of consciousness.  She did not notice any open wound or bleeding.  She is currently not taking any medications including lisinopril or any blood thinners. A/P: She had a minor head injury from bumping her head on the edge of a stove exhaust fan after a mechanical loss of balance.  She has a small area of tender soft tissue swelling which is minimally raised at the right side of her forehead without any open wound or bruising.  She is otherwise asymptomatic.  I recommended conservative management with as needed over-the-counter NSAIDs.

## 2018-06-13 NOTE — Progress Notes (Signed)
Internal Medicine Clinic Attending  Case discussed with Dr. Posey Pronto  soon after the resident saw the patient.  We reviewed the resident's history and exam and pertinent patient test results.  I agree with the assessment, diagnosis, and plan of care documented in the resident's note.  Oda Kilts, MD

## 2018-06-16 ENCOUNTER — Other Ambulatory Visit: Payer: Self-pay | Admitting: Student in an Organized Health Care Education/Training Program

## 2018-06-16 DIAGNOSIS — Z1231 Encounter for screening mammogram for malignant neoplasm of breast: Secondary | ICD-10-CM

## 2018-07-14 ENCOUNTER — Encounter: Payer: Self-pay | Admitting: Student in an Organized Health Care Education/Training Program

## 2018-07-14 ENCOUNTER — Other Ambulatory Visit: Payer: Self-pay

## 2018-07-14 ENCOUNTER — Ambulatory Visit (INDEPENDENT_AMBULATORY_CARE_PROVIDER_SITE_OTHER): Payer: PPO | Admitting: Student in an Organized Health Care Education/Training Program

## 2018-07-14 DIAGNOSIS — E669 Obesity, unspecified: Secondary | ICD-10-CM

## 2018-07-14 DIAGNOSIS — Z6841 Body Mass Index (BMI) 40.0 and over, adult: Secondary | ICD-10-CM | POA: Diagnosis not present

## 2018-07-14 DIAGNOSIS — M79606 Pain in leg, unspecified: Secondary | ICD-10-CM

## 2018-07-14 DIAGNOSIS — K219 Gastro-esophageal reflux disease without esophagitis: Secondary | ICD-10-CM

## 2018-07-14 DIAGNOSIS — I10 Essential (primary) hypertension: Secondary | ICD-10-CM

## 2018-07-14 DIAGNOSIS — E785 Hyperlipidemia, unspecified: Secondary | ICD-10-CM | POA: Diagnosis not present

## 2018-07-14 DIAGNOSIS — Z0000 Encounter for general adult medical examination without abnormal findings: Secondary | ICD-10-CM

## 2018-07-14 NOTE — Assessment & Plan Note (Signed)
I ordered a DEXA scan at our last visit, patient was reminded to call and set up an appointment.  Patient declined zoster vaccination today.

## 2018-07-14 NOTE — Progress Notes (Signed)
   Assessment and Plan:  See Encounters tab for problem-based medical decision making.   __________________________________________________________  HPI:   68 year old woman living with obesity and hypertension here for follow-up of hypertension.  Doing well with no complaints today.  She has been exercising regularly at the [facility].  Eating a healthy diet.  We talked about starting lisinopril at her last visit and she was initially minimal.  However on further thought she decided that she did not want to take this medication.  Has not started it.  Has been checking her blood pressure at the gym after exercising and said it is consistently in the 140s-150s.  Denies any headaches, chest pain, shortness of breath.  Good exertional capacity.  Good mood.  Things are going well for her at home.  No tobacco.  __________________________________________________________  Problem List: Patient Active Problem List   Diagnosis Date Noted  . Hypertension 03/18/2017    Priority: High  . Obesity 11/19/2012    Priority: High  . Prediabetes 03/18/2017    Priority: Medium  . Gastroesophageal reflux disease 01/28/2007    Priority: Medium  . Hyperlipidemia 10/10/2006    Priority: Medium  . Beta thalassemia trait 11/25/2013    Priority: Low  . Preventative health care 07/12/2011    Priority: Low    Medications: Reconciled today in Epic __________________________________________________________  Physical Exam:  Vital Signs: Vitals:   07/14/18 0819  BP: 133/86  Pulse: 75  Temp: 97.9 F (36.6 C)  TempSrc: Oral  SpO2: 100%  Weight: 235 lb 11.2 oz (106.9 kg)    Gen: Well appearing, NAD ENT: OP clear without erythema or exudate.  Neck: No cervical LAD, No thyromegaly or nodules, No JVD. CV: RRR, no murmurs Pulm: Normal effort, CTA throughout, no wheezing Ext: Warm, no edema, normal joints, normal bilateral DP pulses.  No neuropathy on monofilament exam.

## 2018-07-14 NOTE — Assessment & Plan Note (Signed)
Symptomatically stable.  Patient uses over-the-counter omeprazole as needed.

## 2018-07-14 NOTE — Patient Instructions (Signed)
1. We talked about your blood pressure today. Please continue to check it at home or at the gym a few times a week. Ideally, we want your blood pressure to be below 120/80.   2. We talked about your high cholesterol. We decided to keep working on diet and exercise. We will recheck your cholesterol levels in the spring.   3.  We talked about your leg pain, you have good blood flow and no signs of neuropathy so maybe it is sore muscles related to exercise.  4.  We talked about your reflux, and you are going to use omeprazole for 2 weeks at a time as needed for flares of reflux symptoms.  5.  We talked about scheduling a bone density test, please call the bone density center to schedule that at your convenience.

## 2018-07-14 NOTE — Assessment & Plan Note (Signed)
Blood pressure mildly elevated today.  Patient did not start taking lisinopril as we discussed at her last visit, still worried about potential for side effects.  She is doing a great job with diet and exercise, going to the gym.  Still having some elevated blood pressures in the ambulatory setting.  Declining medication today.  Advised her to continue to limit healthy lifestyle, follow-up with me regularly.

## 2018-07-14 NOTE — Assessment & Plan Note (Signed)
Patient asked about her hyperlipidemia.  She has an elevated risk for ischemic vascular events based on hyperlipidemia and hypertension, currently not wanting to go on medication therapy.  We talked about the benefits.  She wants to try natural remedies including diet and lifestyle modifications.  I advised that we should recheck her cholesterol in the spring, about 2 years after last

## 2018-07-28 ENCOUNTER — Ambulatory Visit
Admission: RE | Admit: 2018-07-28 | Discharge: 2018-07-28 | Disposition: A | Discharge status: Home / Self Care | Payer: PPO | Source: Ambulatory Visit | Attending: Student in an Organized Health Care Education/Training Program | Admitting: Student in an Organized Health Care Education/Training Program

## 2018-07-28 DIAGNOSIS — Z1231 Encounter for screening mammogram for malignant neoplasm of breast: Secondary | ICD-10-CM | POA: Diagnosis not present

## 2018-08-08 ENCOUNTER — Encounter: Payer: Self-pay | Admitting: Gastroenterology

## 2018-08-14 ENCOUNTER — Ambulatory Visit (INDEPENDENT_AMBULATORY_CARE_PROVIDER_SITE_OTHER): Payer: PPO | Admitting: Internal Medicine

## 2018-08-14 ENCOUNTER — Other Ambulatory Visit: Payer: Self-pay

## 2018-08-14 ENCOUNTER — Encounter: Payer: Self-pay | Admitting: Internal Medicine

## 2018-08-14 DIAGNOSIS — M25562 Pain in left knee: Secondary | ICD-10-CM | POA: Diagnosis not present

## 2018-08-14 DIAGNOSIS — M25569 Pain in unspecified knee: Secondary | ICD-10-CM | POA: Insufficient documentation

## 2018-08-14 NOTE — Patient Instructions (Addendum)
Tina Stone,   I order an ultrasound for your left leg to check on your knee pain. In the meantime you can take tylenol and or Ibuprofen for the pain.   I will call you with the results of your ultrasound soon as I receive them.  Please make an appointment with your doctor (Dr. Evette Doffing) in 2 months.  You can also make an appointment with [place] sooner if you need to.  Please call us if you have any questions or concerns.  - Dr. Frederico Hamman

## 2018-08-15 ENCOUNTER — Encounter: Payer: Self-pay | Admitting: Internal Medicine

## 2018-08-15 NOTE — Progress Notes (Signed)
   CC: Knee pain   HPI:  [name] is a 68 y.o. female with PMH listed below who presents to clinic for evaluation of left knee pain.  L knee pain: [name] presents with a 3 day history of of acute onset of posterior L knee pain.  She woke up with this pain 3 days ago and describes it as constant and sharp.  She is unable to extend her knee fully due to pain.  Pain worsens when standing up and with ambulation.  She has not tried anything for pain relief.  She is very active and jogs several days a week, but denies recent increase in activity or trauma/falls.  On exam range of motion of the left knee is limited by pain.  No palpable masses or pulsatile masses on palpation of posterior knee.  Bilateral knee x-ray from 2008 showed degenerative arthritic changes with mild medial compartment narrowing in the left knee.  Etiology unclear at this time, differential diagnosis includes primary OA, Baker's cyst, tendon/meniscal injury though no falls or trauma recently.  Will order left lower extremity ultrasound for further evaluation.  DME order for knee brace placed per patient request.  Recommended alternating with ibuprofen and Tylenol for pain control (normal renal function at baseline). - Follow up LLE ultrasound - DME order for knee brace - Ibuprofen and Tylenol PRN for pain   Past Medical History:  Diagnosis Date  . Adenomatous colon polyp   . Allergic rhinitis, seasonal   . Beta thalassemia trait 11/25/2013  . Cholelithiasis   . Class 3 obesity without serious comorbidity with body mass index (BMI) of 40.0 to 44.9 in adult 11/19/2012  . GERD (gastroesophageal reflux disease)   . HLD (hyperlipidemia)   . Hypertension 03/18/2017  . Intraductal papilloma of left breast    Patient underwent left needle-localized lumpectomy by Dr. Imogene Burn. Tsuei on 09/09/2013; pathology showed intraductal papilloma with no atypia or malignancy identified.  . Prediabetes 03/18/2017   History of elevated  glucose with A1c of 5.8% in 2013. This is consistent with obesity and possible metabolic syndrome. Plan to check hemoglobin A1c today and annually.  Marland Kitchen Uterine fibroid    Review of Systems:   Review of Systems  Constitutional: Negative for chills, fever and malaise/fatigue.  Musculoskeletal: Positive for joint pain. Negative for back pain, falls and myalgias.   Physical Exam: Vitals:   08/14/18 1343  BP: (!) 143/73  Pulse: 95  Temp: 98.7 F (37.1 C)  TempSrc: Oral  SpO2: 98%  Weight: 235 lb 4.8 oz (106.7 kg)  Height: 5\' 4"  (1.626 m)   General: Well-appearing female who appears younger stated age in no acute distress LLE: Warm and well perfused with no cyanosis or edema.  Tenderness to palpation over posterior aspect of knee.  No masses or pulsatile masses palpated on exam.  Limited range of motion on extension of knee on extension due to pain.   Assessment & Plan:   See Encounters Tab for problem based charting.  Patient discussed with Dr. Lynnae January

## 2018-08-15 NOTE — Assessment & Plan Note (Signed)
L knee pain: [name] presents with a 3 day history of of acute onset of posterior L knee pain.  She woke up with this pain 3 days ago and describes it as constant and sharp.  She is unable to extend her knee fully due to pain.  Pain worsens when standing up and with ambulation.  She has not tried anything for pain relief.  She is very active and jogs several days a week, but denies recent increase in activity or trauma/falls.  On exam range of motion of the left knee is limited by pain.  No palpable masses or pulsatile masses on palpation of posterior knee.  Bilateral knee x-ray from 2008 showed degenerative arthritic changes with mild medial compartment narrowing in the left knee.  Etiology unclear at this time, differential diagnosis includes primary OA, Baker's cyst, tendon/meniscal injury though no falls or trauma recently.  Will order left lower extremity ultrasound for further evaluation.  DME order for knee brace placed per patient request.  Recommended alternating with ibuprofen and Tylenol for pain control (normal renal function at baseline). - Follow up LLE ultrasound - DME order for knee brace - Ibuprofen and Tylenol PRN for pain

## 2018-08-16 ENCOUNTER — Ambulatory Visit (HOSPITAL_COMMUNITY)
Admission: EM | Admit: 2018-08-16 | Discharge: 2018-08-16 | Disposition: A | Discharge status: Other Acute Inpatient Hospital | Payer: PPO

## 2018-08-18 ENCOUNTER — Telehealth: Payer: Self-pay | Admitting: *Deleted

## 2018-08-18 ENCOUNTER — Ambulatory Visit (HOSPITAL_COMMUNITY)
Admission: RE | Admit: 2018-08-18 | Discharge: 2018-08-18 | Disposition: A | Discharge status: Home / Self Care | Payer: PPO | Source: Ambulatory Visit | Attending: Internal Medicine | Admitting: Internal Medicine

## 2018-08-18 DIAGNOSIS — M25562 Pain in left knee: Secondary | ICD-10-CM | POA: Insufficient documentation

## 2018-08-18 NOTE — Telephone Encounter (Signed)
pls sch appt Tina Stone 2 months

## 2018-08-18 NOTE — Progress Notes (Signed)
LLE venous duplex prelim: negative for DVT.  Landry Mellow, RDMS, RVT     Called results to [facility].

## 2018-08-18 NOTE — Telephone Encounter (Signed)
Tina Stone, [facility] lab calls and states pt is NEG for dvt LEFT leg

## 2018-08-18 NOTE — Progress Notes (Signed)
Internal Medicine Clinic Attending  Case discussed with Dr. Santos-Sanchez at the time of the visit.  We reviewed the resident's history and exam and pertinent patient test results.  I agree with the assessment, diagnosis, and plan of care documented in the resident's note.    

## 2018-08-20 ENCOUNTER — Telehealth: Payer: Self-pay | Admitting: Student in an Organized Health Care Education/Training Program

## 2018-08-20 NOTE — Telephone Encounter (Signed)
I placed a DME order for it when I saw her. I will send a message to [place].

## 2018-08-20 NOTE — Telephone Encounter (Signed)
Spoke with patient and relayed directions for alternating ibuprofen and tylenol and ice and heat. Patient will try this. Confirmed she never received knee brace and didn't realize one had been ordered. Patient would like this as soon as possible. Hubbard Hartshorn, RN, BSN

## 2018-08-20 NOTE — Telephone Encounter (Signed)
Patient aware that test was negative for DVT. Appt made with PCP for 10/13/2018. Patient is wondering what she should do in meantime. Still with pain in knee when walking. Feels better when swimming and especially when in whirlpool. Hubbard Hartshorn, RN, BSN

## 2018-08-20 NOTE — Telephone Encounter (Signed)
Pt is checking on the results from knee results; pt contact# [phone]

## 2018-08-20 NOTE — Telephone Encounter (Signed)
If it is getting better I recommend continuing Ibuprofen and alternate it with Tylenol as well as ice and heat. She can also keep using the knee brace. Recommend coming back to [facility] for further evaluation if pain is worsening or follow up in 2-4 weeks.

## 2018-08-22 NOTE — Telephone Encounter (Signed)
DME order for knee brace printed and given to [facility] on 08/21/2018. Hubbard Hartshorn, RN, BSN

## 2018-08-23 ENCOUNTER — Encounter (HOSPITAL_COMMUNITY): Payer: Self-pay | Admitting: Emergency Medicine

## 2018-08-23 ENCOUNTER — Emergency Department (HOSPITAL_COMMUNITY)
Admission: EM | Admit: 2018-08-23 | Discharge: 2018-08-23 | Disposition: A | Discharge status: Home / Self Care | Payer: PPO | Attending: Emergency Medicine | Admitting: Emergency Medicine

## 2018-08-23 DIAGNOSIS — R531 Weakness: Secondary | ICD-10-CM | POA: Diagnosis not present

## 2018-08-23 DIAGNOSIS — R42 Dizziness and giddiness: Secondary | ICD-10-CM | POA: Diagnosis present

## 2018-08-23 DIAGNOSIS — H81399 Other peripheral vertigo, unspecified ear: Secondary | ICD-10-CM

## 2018-08-23 HISTORY — DX: Pure hypercholesterolemia, unspecified: E78.00

## 2018-08-23 LAB — COMPREHENSIVE METABOLIC PANEL
ALT: 11 U/L (ref 0–44)
AST: 15 U/L (ref 15–41)
Albumin: 3.5 g/dL (ref 3.5–5.0)
Alkaline Phosphatase: 81 U/L (ref 38–126)
Anion gap: 8 (ref 5–15)
BILIRUBIN TOTAL: 0.8 mg/dL (ref 0.3–1.2)
BUN: 13 mg/dL (ref 8–23)
CALCIUM: 8.8 mg/dL — AB (ref 8.9–10.3)
CO2: 23 mmol/L (ref 22–32)
Chloride: 106 mmol/L (ref 98–111)
Creatinine, Ser: 0.91 mg/dL (ref 0.44–1.00)
GFR calc non Af Amer: >60 mL/min (ref >60)
GLUCOSE: 101 mg/dL — AB (ref 70–99)
Potassium: 3.9 mmol/L (ref 3.5–5.1)
Sodium: 137 mmol/L (ref 135–145)
Total Protein: 6.6 g/dL (ref 6.5–8.1)

## 2018-08-23 LAB — DIFFERENTIAL
Abs Immature Granulocytes: 0 10*3/uL (ref 0.0–0.1)
BASOS PCT: 1 %
Basophils Absolute: 0 10*3/uL (ref 0.0–0.1)
EOS ABS: 0.1 10*3/uL (ref 0.0–0.7)
Eosinophils Relative: 3 %
Immature Granulocytes: 1 %
LYMPHS ABS: 1.4 10*3/uL (ref 0.7–4.0)
Lymphocytes Relative: 33 %
MONO ABS: 0.3 10*3/uL (ref 0.1–1.0)
Monocytes Relative: 8 %
NEUTROS ABS: 2.3 10*3/uL (ref 1.7–7.7)
NEUTROS PCT: 54 %

## 2018-08-23 LAB — I-STAT CHEM 8, ED
BUN: 14 mg/dL (ref 8–23)
CHLORIDE: 105 mmol/L (ref 98–111)
Calcium, Ion: 1.03 mmol/L — ABNORMAL LOW (ref 1.15–1.40)
Creatinine, Ser: 0.9 mg/dL (ref 0.44–1.00)
Glucose, Bld: 102 mg/dL — ABNORMAL HIGH (ref 70–99)
HEMATOCRIT: 39 % (ref 36.0–46.0)
Hemoglobin: 13.3 g/dL (ref 12.0–15.0)
Potassium: 3.8 mmol/L (ref 3.5–5.1)
SODIUM: 138 mmol/L (ref 135–145)
TCO2: 23 mmol/L (ref 22–32)

## 2018-08-23 LAB — CBC
HCT: 39.9 % (ref 36.0–46.0)
Hemoglobin: 12.4 g/dL (ref 12.0–15.0)
MCH: 24.2 pg — AB (ref 26.0–34.0)
MCHC: 31.1 g/dL (ref 30.0–36.0)
MCV: 77.9 fL — AB (ref 78.0–100.0)
PLATELETS: 241 10*3/uL (ref 150–400)
RBC: 5.12 MIL/uL — ABNORMAL HIGH (ref 3.87–5.11)
RDW: 15.4 % (ref 11.5–15.5)
WBC: 4.1 10*3/uL (ref 4.0–10.5)

## 2018-08-23 LAB — I-STAT TROPONIN, ED: Troponin i, poc: 0.01 ng/mL (ref 0.00–0.08)

## 2018-08-23 LAB — APTT: aPTT: 31 seconds (ref 24–36)

## 2018-08-23 LAB — PROTIME-INR
INR: 1.01
PROTHROMBIN TIME: 13.2 s (ref 11.4–15.2)

## 2018-08-23 MED ORDER — MECLIZINE HCL 25 MG PO TABS: 25.0000 mg | ORAL_TABLET | Freq: Three times a day (TID) | ORAL | 0 refills | Status: DC | PRN

## 2018-08-23 NOTE — ED Provider Notes (Signed)
[facility] Emergency Department Provider Note MRN:  [id number]  Arrival date & time: 08/23/18     Chief Complaint   Dizziness   History of Present Illness   Tina Stone is a 68 y.o. year-old female with a history of GERD presenting to the ED with chief complaint of dizziness.  Patient concerned that she took a new GERD medication last night, went to bed feeling her normal self, woke up this morning feeling normal.  While turning her head this morning, she felt a sudden sensation of the room spinning.  The sensation was abated when she moved her head back to the original position.  This occurred one other instance, again related to head positioning.  She denies any headache or vision change, no chest pain or shortness of breath, no abdominal pain, no dysuria.  Mild nausea with the dizziness.  Review of Systems  A complete 10 system review of systems was obtained and all systems are negative except as noted in the HPI and PMH.   Patient's Health History    Past Medical History:  Diagnosis Date  . GERD (gastroesophageal reflux disease)   . Hypercholesterolemia     History reviewed. No pertinent surgical history.  No family history on file.  Social History   Socioeconomic History  . Marital status: Widowed    Spouse name: Not on file  . Number of children: Not on file  . Years of education: Not on file  . Highest education level: Not on file  Occupational History  . Not on file  Social Needs  . Financial resource strain: Not on file  . Food insecurity:    Worry: Not on file    Inability: Not on file  . Transportation needs:    Medical: Not on file    Non-medical: Not on file  Tobacco Use  . Smoking status: Former Research scientist (life sciences)  . Smokeless tobacco: Never Used  Substance and Sexual Activity  . Alcohol use: Never    Frequency: Never  . Drug use: Never  . Sexual activity: Not on file  Lifestyle  . Physical activity:    Days per week: Not on file   Minutes per session: Not on file  . Stress: Not on file  Relationships  . Social connections:    Talks on phone: Not on file    Gets together: Not on file    Attends religious service: Not on file    Active member of club or organization: Not on file    Attends meetings of clubs or organizations: Not on file    Relationship status: Not on file  . Intimate partner violence:    Fear of current or ex partner: Not on file    Emotionally abused: Not on file    Physically abused: Not on file    Forced sexual activity: Not on file  Other Topics Concern  . Not on file  Social History Narrative  . Not on file     Physical Exam  Vital Signs and Nursing Notes reviewed Vitals:   08/23/18 0514 08/23/18 0730  BP: (!) 160/89 (!) 158/89  Pulse: 92 74  Resp: 16 18  Temp: 98.3 F (36.8 C)   SpO2: 100% 100%    CONSTITUTIONAL: Well-appearing, NAD NEURO:  Alert and oriented x 3, no focal deficits EYES:  eyes equal and reactive ENT/NECK:  no LAD, no JVD CARDIO: Regular rate, well-perfused, normal S1 and S2 PULM:  CTAB no wheezing or rhonchi GI/GU:  normal bowel sounds, non-distended, non-tender MSK/SPINE:  No gross deformities, no edema SKIN:  no rash, atraumatic PSYCH:  Appropriate speech and behavior  Diagnostic and Interventional Summary    EKG Interpretation  Date/Time:  Saturday August 23 2018 05:09:40 EDT Ventricular Rate:  84 PR Interval:  174 QRS Duration: 70 QT Interval:  360 QTC Calculation: 425 R Axis:   13 Text Interpretation:  Normal sinus rhythm Possible Left atrial enlargement Cannot rule out Anterior infarct , age undetermined Abnormal ECG Confirmed by Gerlene Fee [phone]) on 08/23/2018 6:57:06 AM      Labs Reviewed  CBC - Abnormal; Notable for the following components:      Result Value   RBC 5.12 (*)    MCV 77.9 (*)    MCH 24.2 (*)    All other components within normal limits  COMPREHENSIVE METABOLIC PANEL - Abnormal; Notable for the following components:     Glucose, Bld 101 (*)    Calcium 8.8 (*)    All other components within normal limits  I-STAT CHEM 8, ED - Abnormal; Notable for the following components:   Glucose, Bld 102 (*)    Calcium, Ion 1.03 (*)    All other components within normal limits  PROTIME-INR  APTT  DIFFERENTIAL  I-STAT TROPONIN, ED    No orders to display    Medications - No data to display   Procedures Critical Care  ED Course and Medical Decision Making  I have reviewed the triage vital signs and the nursing notes.  Pertinent labs & imaging results that were available during my care of the patient were reviewed by me and considered in my medical decision making (see below for details). Clinical Course as of Aug 23 1642  Sat Aug 23, 2018  2202 Dizziness related to head position consistent with benign vertigo in the 68 year old female who is generally very healthy with few cardiovascular risk factors.  Currently asymptomatic, hemodynamically stable, well-appearing, no focal neurological deficits, nothing to suggest central vertigo.  EKG unremarkable, troponin negative, electrolytes within normal limits.  Provided reassurance, education, will follow-up with PCP.After the discussed management above, the patient was determined to be safe for discharge.  The patient was in agreement with this plan and all questions regarding their care were answered.  ED return precautions were discussed and the patient will return to the ED with any significant worsening of condition.   [MB]    Clinical Course User Index [MB] Sedonia Small Barth Kirks, MD     Barth Kirks. Sedonia Small, [place] [email or web address]  Final Clinical Impressions(s) / ED Diagnoses     ICD-10-CM   1. Peripheral vertigo, unspecified laterality H81.399     ED Discharge Orders         Ordered    meclizine (ANTIVERT) 25 MG tablet  3 times daily PRN     08/23/18 0703             Maudie Flakes, MD 08/23/18  1643

## 2018-08-23 NOTE — ED Triage Notes (Signed)
Reports feeling good at bedtime last night.  Took omeprazole 20mg  and reports feeling dizzy 20-30 minutes afterwards.  No other complaints voiced at this time.

## 2018-08-23 NOTE — Discharge Instructions (Signed)
You were evaluated in the Emergency Department and after careful evaluation, we did not find any emergent condition requiring admission or further testing in the hospital.  Your symptoms today seem to be due to vertigo.  Please take the medication provided as needed if you experience recurrence of symptoms.  Please return to the Emergency Department if you experience any worsening of your condition.  We encourage you to follow up with a primary care provider.  Thank you for allowing [place] to be a part of your care.

## 2018-08-26 ENCOUNTER — Encounter: Payer: Self-pay | Admitting: Internal Medicine

## 2018-08-27 NOTE — Telephone Encounter (Signed)
Called patient and LM to f/u with her knee brace.

## 2018-08-27 NOTE — Telephone Encounter (Signed)
Pt called back in reference to her Brace.  She has still no rec'd the brace.  Refaxed patient's order to the [facility] ([place]) @ [phone].  Patient to call our office back if they have not contacted her to be fitted.  Pt. States she was told she could get it at a [facility] or [facility] but prefers to use [facility] so she can be properly fitted for this brace.

## 2018-09-02 ENCOUNTER — Other Ambulatory Visit: Payer: PPO

## 2018-09-22 ENCOUNTER — Encounter: Payer: Self-pay | Admitting: Gastroenterology

## 2018-09-22 ENCOUNTER — Ambulatory Visit (AMBULATORY_SURGERY_CENTER): Payer: Self-pay | Admitting: *Deleted

## 2018-09-22 VITALS — Ht 64.0 in | Wt 234.0 lb

## 2018-09-22 DIAGNOSIS — Z86010 Personal history of colonic polyps: Secondary | ICD-10-CM

## 2018-09-22 NOTE — Progress Notes (Signed)
No egg or soy allergy known to patient   issues with past sedation with any surgeries  or procedures of PONV , no intubation problems  No diet pills per patient No home 02 use per patient  No blood thinners per patient  Pt denies issues with constipation  No A fib or A flutter  EMMI video sent to pt's e mail - pt declined   

## 2018-09-24 ENCOUNTER — Encounter: Payer: Self-pay | Admitting: Internal Medicine

## 2018-09-24 ENCOUNTER — Other Ambulatory Visit: Payer: Self-pay

## 2018-09-24 ENCOUNTER — Ambulatory Visit (INDEPENDENT_AMBULATORY_CARE_PROVIDER_SITE_OTHER): Payer: PPO | Admitting: Internal Medicine

## 2018-09-24 VITALS — BP 150/83 | HR 76 | Temp 98.3°F | Ht 64.0 in | Wt 233.9 lb

## 2018-09-24 DIAGNOSIS — Z833 Family history of diabetes mellitus: Secondary | ICD-10-CM

## 2018-09-24 DIAGNOSIS — R7303 Prediabetes: Secondary | ICD-10-CM

## 2018-09-24 DIAGNOSIS — I10 Essential (primary) hypertension: Secondary | ICD-10-CM

## 2018-09-24 LAB — GLUCOSE, CAPILLARY: Glucose-Capillary: 88 mg/dL (ref 70–99)

## 2018-09-24 LAB — POCT GLYCOSYLATED HEMOGLOBIN (HGB A1C): Hemoglobin A1C: 5.5 % (ref 4.0–5.6)

## 2018-09-24 NOTE — Patient Instructions (Addendum)
Tina Stone,  It was a pleasure to meet you! Your glucose today was 88, which is normal. Keep up the good work with your exercising. Please follow up with your primary care doctor next month for your scheduled appointment. If you have any questions or concerns, call our clinic at [phone] or after hours call [phone] and ask for the internal medicine resident on call. Thank you!  - Dr. Philipp Ovens

## 2018-09-24 NOTE — Progress Notes (Signed)
   CC: High blood glucose  HPI:  [name] is a 68 y.o. female with past medical history outlined below here for evaluation for high blood glucose. For the details of today's visit, please refer to the assessment and plan.  Past Medical History:  Diagnosis Date  . Adenomatous colon polyp   . Allergic rhinitis, seasonal   . Allergy   . Beta thalassemia trait 11/25/2013  . Cholelithiasis   . Class 3 obesity without serious comorbidity with body mass index (BMI) of 40.0 to 44.9 in adult 11/19/2012  . GERD (gastroesophageal reflux disease)   . HLD (hyperlipidemia)   . Hypercholesterolemia   . Hypertension 03/18/2017   no meds   . Intraductal papilloma of left breast    Patient underwent left needle-localized lumpectomy by Dr. Imogene Burn. Tsuei on 09/09/2013; pathology showed intraductal papilloma with no atypia or malignancy identified.  Marland Kitchen PONV (postoperative nausea and vomiting)   . Prediabetes 03/18/2017   History of elevated glucose with A1c of 5.8% in 2013. This is consistent with obesity and possible metabolic syndrome. Plan to check hemoglobin A1c today and annually.  Marland Kitchen Uterine fibroid     Review of Systems  Genitourinary: Negative for frequency.  Endo/Heme/Allergies: Negative for polydipsia.     Physical Exam:  Vitals:   09/24/18 0915 09/24/18 0944  BP: (!) 145/87 (!) 150/83  Pulse: 74 76  Temp: 98.3 F (36.8 C)   TempSrc: Oral   SpO2: 99%   Weight: 233 lb 14.4 oz (106.1 kg)   Height: 5\' 4"  (1.626 m)     Constitutional: NAD, appears comfortable Cardiovascular: RRR, no murmurs, rubs, or gallops.  Pulmonary/Chest: CTAB, no wheezes, rales, or rhonchi.  Extremities: Warm and well perfused. No edema.  Psychiatric: Normal mood and affect  Assessment & Plan:   See Encounters Tab for problem based charting.  Patient discussed with Dr. Angelia Mould

## 2018-09-24 NOTE — Assessment & Plan Note (Addendum)
Blood pressure is elevated today, 145/87 and 150/83 on recheck. Patient does have a history of hypertension but has previously declined pharmacologic therapy. She is currently exercising at the [facility] three times a week. Patient has follow up scheduled next month with her PCP. Medication management can be readdressed at that visit. -- Follow up PCP

## 2018-09-24 NOTE — Assessment & Plan Note (Signed)
Patient has a history of prediabetes with A1c of 5.8 in 2013. Last A1c check in March of 2018 was 5.6. Patient reports a history of diabetes in her family and checks her blood sugar occasionally at home. This morning, patient checked a fast glucose after exercising at the [facility] and it was 218. Patient was concerned and came in for evaluation. CBG here is 88 (fasting) and repeat A1c 5.5. On further questioning, patient reports using her mom's old meter and likely expired testing strips. Reassured patient that her glucose here is normal. Encouraged her to keep up the good work with her exercise. Reports swimming at the [facility] three times a week.  -- Monitor yearly Hgb A1c -- Follow up PCP

## 2018-09-29 NOTE — Progress Notes (Signed)
Internal Medicine Clinic Attending  Case discussed with Dr. Guilloud at the time of the visit.  We reviewed the resident's history and exam and pertinent patient test results.  I agree with the assessment, diagnosis, and plan of care documented in the resident's note.  

## 2018-10-06 ENCOUNTER — Ambulatory Visit (AMBULATORY_SURGERY_CENTER): Payer: PPO | Admitting: Gastroenterology

## 2018-10-06 ENCOUNTER — Encounter: Payer: Self-pay | Admitting: Gastroenterology

## 2018-10-06 VITALS — BP 146/86 | HR 65 | Temp 97.5°F | Resp 19 | Ht 64.0 in | Wt 234.0 lb

## 2018-10-06 DIAGNOSIS — Z86010 Personal history of colonic polyps: Secondary | ICD-10-CM | POA: Diagnosis not present

## 2018-10-06 DIAGNOSIS — D122 Benign neoplasm of ascending colon: Secondary | ICD-10-CM | POA: Diagnosis not present

## 2018-10-06 DIAGNOSIS — I10 Essential (primary) hypertension: Secondary | ICD-10-CM | POA: Diagnosis not present

## 2018-10-06 MED ORDER — SODIUM CHLORIDE 0.9 % IV SOLN: 500.0000 mL | Freq: Once | INTRAVENOUS | Status: DC

## 2018-10-06 NOTE — Patient Instructions (Signed)
Handouts given on polyps and diverticulosis and hemorrhoids. Repeat colonoscopy in 5 years.   YOU HAD AN ENDOSCOPIC PROCEDURE TODAY AT [facility]:   Refer to the procedure report that was given to you for any specific questions about what was found during the examination.  If the procedure report does not answer your questions, please call your gastroenterologist to clarify.  If you requested that your care partner not be given the details of your procedure findings, then the procedure report has been included in a sealed envelope for you to review at your convenience later.  YOU SHOULD EXPECT: Some feelings of bloating in the abdomen. Passage of more gas than usual.  Walking can help get rid of the air that was put into your GI tract during the procedure and reduce the bloating. If you had a lower endoscopy (such as a colonoscopy or flexible sigmoidoscopy) you may notice spotting of blood in your stool or on the toilet paper. If you underwent a bowel prep for your procedure, you may not have a normal bowel movement for a few days.  Please Note:  You might notice some irritation and congestion in your nose or some drainage.  This is from the oxygen used during your procedure.  There is no need for concern and it should clear up in a day or so.  SYMPTOMS TO REPORT IMMEDIATELY:   Following lower endoscopy (colonoscopy or flexible sigmoidoscopy):  Excessive amounts of blood in the stool  Significant tenderness or worsening of abdominal pains  Swelling of the abdomen that is new, acute  Fever of 100F or higher   For urgent or emergent issues, a gastroenterologist can be reached at any hour by calling [phone].   DIET:  We do recommend a small meal at first, but then you may proceed to your regular diet.  Drink plenty of fluids but you should avoid alcoholic beverages for 24 hours.  ACTIVITY:  You should plan to take it easy for the rest of today and you should NOT DRIVE  or use heavy machinery until tomorrow (because of the sedation medicines used during the test).    FOLLOW UP: Our staff will call the number listed on your records the next business day following your procedure to check on you and address any questions or concerns that you may have regarding the information given to you following your procedure. If we do not reach you, we will leave a message.  However, if you are feeling well and you are not experiencing any problems, there is no need to return our call.  We will assume that you have returned to your regular daily activities without incident.  If any biopsies were taken you will be contacted by phone or by letter within the next 1-3 weeks.  Please call us at [phone] if you have not heard about the biopsies in 3 weeks.    SIGNATURES/CONFIDENTIALITY: You and/or your care partner have signed paperwork which will be entered into your electronic medical record.  These signatures attest to the fact that that the information above on your After Visit Summary has been reviewed and is understood.  Full responsibility of the confidentiality of this discharge information lies with you and/or your care-partner.

## 2018-10-06 NOTE — Progress Notes (Signed)
To recovery, report to RN, VSS. 

## 2018-10-06 NOTE — Progress Notes (Signed)
Called to room to assist during endoscopic procedure.  Patient ID and intended procedure confirmed with present staff. Received instructions for my participation in the procedure from the performing physician.  

## 2018-10-06 NOTE — Progress Notes (Signed)
Pt's states no medical or surgical changes since previsit or office visit. 

## 2018-10-06 NOTE — Op Note (Signed)
[place] Patient Name: Tina Stone Procedure Date: 10/06/2018 8:35 AM MRN: [id number] Endoscopist: Mauri Pole , MD Age: 68 Referring MD:  Date of Birth: 09/19/1950 Gender: Female Account #: [id number] Procedure:                Colonoscopy Indications:              High risk colon cancer surveillance: Personal                            history of adenoma less than 10 mm in size, Last                            colonoscopy: 2014 Medicines:                Monitored Anesthesia Care Procedure:                Pre-Anesthesia Assessment:                           - Prior to the procedure, a History and Physical                            was performed, and patient medications and                            allergies were reviewed. The patient's tolerance of                            previous anesthesia was also reviewed. The risks                            and benefits of the procedure and the sedation                            options and risks were discussed with the patient.                            All questions were answered, and informed consent                            was obtained. Prior Anticoagulants: The patient has                            taken no previous anticoagulant or antiplatelet                            agents. ASA Grade Assessment: II - A patient with                            mild systemic disease. After reviewing the risks                            and benefits, the patient was deemed in  satisfactory condition to undergo the procedure.                           After obtaining informed consent, the colonoscope                            was passed under direct vision. Throughout the                            procedure, the patient's blood pressure, pulse, and                            oxygen saturations were monitored continuously. The                            Colonoscope was introduced through the  anus and                            advanced to the the cecum, identified by                            appendiceal orifice and ileocecal valve. The                            colonoscopy was performed without difficulty. The                            patient tolerated the procedure well. The quality                            of the bowel preparation was adequate. The                            ileocecal valve, appendiceal orifice, and rectum                            were photographed. Scope In: 8:41:20 AM Scope Out: 9:01:02 AM Scope Withdrawal Time: 0 hours 15 minutes 12 seconds  Total Procedure Duration: 0 hours 19 minutes 42 seconds  Findings:                 The perianal and digital rectal examinations were                            normal.                           A 3 mm polyp was found in the ascending colon. The                            polyp was sessile. The polyp was removed with a                            cold biopsy forceps. Resection and retrieval were  complete.                           A 8 mm polyp was found in the ascending colon. The                            polyp was sessile. The polyp was removed with a                            cold snare. Resection and retrieval were complete.                           Multiple small and large-mouthed diverticula were                            found in the sigmoid colon and descending colon.                           Non-bleeding internal hemorrhoids were found during                            retroflexion. The hemorrhoids were small. Complications:            No immediate complications. Estimated Blood Loss:     Estimated blood loss was minimal. Impression:               - One 3 mm polyp in the ascending colon, removed                            with a cold biopsy forceps. Resected and retrieved.                           - One 8 mm polyp in the ascending colon, removed                             with a cold snare. Resected and retrieved.                           - Diverticulosis in the sigmoid colon and in the                            descending colon.                           - Non-bleeding internal hemorrhoids. Recommendation:           - Patient has a contact number available for                            emergencies. The signs and symptoms of potential                            delayed complications were discussed with the  patient. Return to normal activities tomorrow.                            Written discharge instructions were provided to the                            patient.                           - Resume previous diet.                           - Continue present medications.                           - Await pathology results.                           - Repeat colonoscopy in 5 years for surveillance                            based on pathology results. Mauri Pole, MD 10/06/2018 9:07:26 AM This report has been signed electronically.

## 2018-10-07 ENCOUNTER — Telehealth: Payer: Self-pay | Admitting: *Deleted

## 2018-10-07 NOTE — Telephone Encounter (Signed)
  Follow up Call-  Call back number 10/06/2018  Post procedure Call Back phone  # [phone]  Permission to leave phone message Yes  Some recent data might be hidden     Patient questions:  Do you have a fever, pain , or abdominal swelling? No. Pain Score  0 *  Have you tolerated food without any problems? Yes.    Have you been able to return to your normal activities? Yes.    Do you have any questions about your discharge instructions: Diet   No. Medications  No. Follow up visit  No.  Do you have questions or concerns about your Care? No.  Actions: * If pain score is 4 or above: No action needed, pain <4.

## 2018-10-13 ENCOUNTER — Encounter: Payer: Self-pay | Admitting: Student in an Organized Health Care Education/Training Program

## 2018-10-13 ENCOUNTER — Ambulatory Visit (INDEPENDENT_AMBULATORY_CARE_PROVIDER_SITE_OTHER): Payer: PPO | Admitting: Student in an Organized Health Care Education/Training Program

## 2018-10-13 ENCOUNTER — Other Ambulatory Visit: Payer: Self-pay

## 2018-10-13 VITALS — BP 153/87 | HR 72 | Temp 97.8°F | Ht 64.0 in | Wt 233.8 lb

## 2018-10-13 DIAGNOSIS — R197 Diarrhea, unspecified: Secondary | ICD-10-CM | POA: Diagnosis not present

## 2018-10-13 DIAGNOSIS — Z23 Encounter for immunization: Secondary | ICD-10-CM | POA: Diagnosis not present

## 2018-10-13 DIAGNOSIS — K219 Gastro-esophageal reflux disease without esophagitis: Secondary | ICD-10-CM

## 2018-10-13 DIAGNOSIS — I10 Essential (primary) hypertension: Secondary | ICD-10-CM | POA: Diagnosis not present

## 2018-10-13 NOTE — Assessment & Plan Note (Signed)
Blood pressure above our goal.  Patient declines the use of antihypertensive medications.  Plan is to continue to monitor this on outpatient basis.  Continue with diet and exercise.  I am hopeful in the near future that she will accept medication intervention.

## 2018-10-13 NOTE — Assessment & Plan Note (Signed)
Mild acute diarrheal illness, Likely viral in origin.  No signs of dehydration, clinically looks well.  No need for labs today.  I advised supportive care, would expect this to resolve in the coming 1-2 days.  Low risk for C. difficile.  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

## 2018-10-13 NOTE — Progress Notes (Signed)
   Assessment and Plan:  See Encounters tab for problem-based medical decision making.   __________________________________________________________  HPI:   68 year old woman here for follow-up of knee pain, and has an acute complaint of diarrhea.  Seen in the clinic a few weeks ago for left knee pain, now resolved.  Says it was over exertion.  She got herself more supportive shoes and feels like pain is better.  Acute diarrhea started last night.  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  Says she was having loose stools about every 15 minutes.  In a few hours and she is had one.  No fever, no vomiting, no abdominal pain.  No recent antibiotic use.  __________________________________________________________  Problem List: Patient Active Problem List   Diagnosis Date Noted  . Hypertension 03/18/2017    Priority: High  . Obesity 11/19/2012    Priority: High  . Prediabetes 03/18/2017    Priority: Medium  . Gastroesophageal reflux disease 01/28/2007    Priority: Medium  . Hyperlipidemia 10/10/2006    Priority: Medium  . Beta thalassemia trait 11/25/2013    Priority: Low  . Preventative health care 07/12/2011    Priority: Low  . Acute diarrhea 10/13/2018    Medications: Reconciled today in Epic __________________________________________________________  Physical Exam:  Vital Signs: Vitals:   10/13/18 0834  BP: (!) 153/87  Pulse: 72  Temp: 97.8 F (36.6 C)  TempSrc: Oral  SpO2: 100%  Weight: 233 lb 12.8 oz (106.1 kg)  Height: 5\' 4"  (1.626 m)    Gen: Well appearing, NAD ENT: OP clear without erythema or exudate.  CV: RRR, no murmurs Pulm: Normal effort, CTA throughout, no wheezing Abd: Soft, NT, ND. Ext: Warm, no edema, normal joints

## 2018-10-13 NOTE — Assessment & Plan Note (Signed)
Symptomatically stable.  She over-the-counter vitamins and had me look at a proprietary enzyme packet which I told her was probably not harmful.  She feels like it is helpful, so okay to continue.  Okay to use omeprazole as well as needed, but she understands that it takes often 2 weeks for its effects to be felt.

## 2018-10-13 NOTE — Patient Instructions (Signed)
Continue to exercise at least 5 days a week, and eat a diet healthy with fruits, vegetables, whole grains, and leukocytes.  I think your stomach illness is probably a virus that should be self-limited.  I do want you to stay out of work on 10/14 so that you do not pass it onto anyone else.  Come and see me again in 6 months to have your blood pressure rechecked, or sooner if any acute issues arise.

## 2018-10-14 ENCOUNTER — Encounter: Payer: Self-pay | Admitting: Gastroenterology

## 2018-10-22 ENCOUNTER — Ambulatory Visit
Admission: RE | Admit: 2018-10-22 | Discharge: 2018-10-22 | Disposition: A | Discharge status: Home / Self Care | Payer: PPO | Source: Ambulatory Visit | Attending: Student in an Organized Health Care Education/Training Program | Admitting: Student in an Organized Health Care Education/Training Program

## 2018-10-22 DIAGNOSIS — Z13820 Encounter for screening for osteoporosis: Secondary | ICD-10-CM | POA: Diagnosis not present

## 2018-10-22 DIAGNOSIS — Z780 Asymptomatic menopausal state: Secondary | ICD-10-CM | POA: Diagnosis not present

## 2018-10-22 DIAGNOSIS — E2839 Other primary ovarian failure: Secondary | ICD-10-CM

## 2018-10-23 ENCOUNTER — Encounter: Payer: Self-pay | Admitting: Student in an Organized Health Care Education/Training Program

## 2018-11-18 ENCOUNTER — Telehealth: Payer: Self-pay | Admitting: Student in an Organized Health Care Education/Training Program

## 2018-11-18 NOTE — Telephone Encounter (Signed)
Pt stated she's having "bad" acid reflux;tried Nexium. She wants to know if she can have a refill on Omeprazole but  a lesser dosage? Or maybe something else. Thanks

## 2018-11-18 NOTE — Telephone Encounter (Signed)
Pt has a question regarding her medicine, pt contact [phone]

## 2018-11-18 NOTE — Telephone Encounter (Signed)
Pt called - no answer; left message to call the office.

## 2018-11-19 MED ORDER — OMEPRAZOLE 20 MG PO CPDR: 20.0000 mg | DELAYED_RELEASE_CAPSULE | Freq: Every day | ORAL | 2 refills | Status: DC

## 2018-11-19 MED FILL — OMEPRAZOLE 20 MG CPDR: 20 | 30 days supply | Qty: 30 | Fill #0

## 2018-11-19 NOTE — Telephone Encounter (Signed)
Sure. Omeprazole sent to outpatient pharmacy.

## 2018-11-20 NOTE — Telephone Encounter (Signed)
Left message on pt's vm of refill.

## 2019-01-22 ENCOUNTER — Encounter: Payer: Self-pay | Admitting: *Deleted

## 2019-01-29 ENCOUNTER — Encounter: Payer: Self-pay | Admitting: Internal Medicine

## 2019-01-29 ENCOUNTER — Ambulatory Visit (INDEPENDENT_AMBULATORY_CARE_PROVIDER_SITE_OTHER): Payer: Medicare Other | Admitting: Internal Medicine

## 2019-01-29 ENCOUNTER — Telehealth: Payer: Self-pay | Admitting: *Deleted

## 2019-01-29 ENCOUNTER — Other Ambulatory Visit: Payer: Self-pay

## 2019-01-29 VITALS — BP 157/89 | HR 90 | Temp 97.9°F | Ht 64.0 in | Wt 234.6 lb

## 2019-01-29 DIAGNOSIS — R1011 Right upper quadrant pain: Secondary | ICD-10-CM | POA: Diagnosis not present

## 2019-01-29 DIAGNOSIS — Z8719 Personal history of other diseases of the digestive system: Secondary | ICD-10-CM | POA: Diagnosis not present

## 2019-01-29 DIAGNOSIS — R1012 Left upper quadrant pain: Secondary | ICD-10-CM | POA: Diagnosis not present

## 2019-01-29 DIAGNOSIS — K219 Gastro-esophageal reflux disease without esophagitis: Secondary | ICD-10-CM | POA: Diagnosis not present

## 2019-01-29 DIAGNOSIS — R1013 Epigastric pain: Secondary | ICD-10-CM

## 2019-01-29 MED ORDER — ONDANSETRON HCL 4 MG PO TABS: 4.0000 mg | ORAL_TABLET | Freq: Once | ORAL | Status: DC

## 2019-01-29 MED ORDER — ONDANSETRON HCL 4 MG PO TABS: 4.0000 mg | ORAL_TABLET | Freq: Three times a day (TID) | ORAL | 0 refills | Status: DC | PRN

## 2019-01-29 MED ORDER — ALUM & MAG HYDROXIDE-SIMETH 200-200-20 MG/5ML PO SUSP
30.0000 mL | Freq: Once | ORAL | Status: AC
  Administered 2019-01-29: 30 mL via ORAL

## 2019-01-29 MED ORDER — HYOSCYAMINE SULFATE 0.125 MG SL SUBL
0.2500 mg | SUBLINGUAL_TABLET | Freq: Once | SUBLINGUAL | Status: AC
  Administered 2019-01-29: 0.25 mg via SUBLINGUAL

## 2019-01-29 MED FILL — ONDANSETRON HCL 4 MG TABLET: 4 | 5 days supply | Qty: 15 | Fill #0

## 2019-01-29 NOTE — Assessment & Plan Note (Signed)
Patient with acute onset of epigastric pain radiating to bil upper quadrants associated with nausea w/o vomiting. She was observed in clinic for a couple of hours and treated with GI cocktail which did improve her symptoms. On re-evaluation pain had decreased from 10 to 5, she endorsed lots of belching and flatus after which she feels further improvement;she did notice onset of chills which was new. She was able to tolerate PO fluid intake. She was afebrile with stable VS.   Etiology is likely gastroenteritis; less likely symptomatic cholelithiasis. No signs of acute abdomen.   She was provided with PRN zofran and work note for today. We discussed return precautions of worsening of pain, PO intolerance, signs of obstruction.

## 2019-01-29 NOTE — Progress Notes (Signed)
   CC: abd pain  HPI:  [name] is a 69 y.o. with a PMH of GERD, cholelithiasis, presenting to clinic for abd pain.  Patient states she ate small amount of grapes this morning like usual and about an hour later she developed severe upper abdominal pain with cramping and bloating. She denies having nausea prior to her appointment, however had a couple of episodes during her visit today w/o vomiting. She endorses passing flatus and having regular BMs. Previous to today, she states her GERD had been well controlled and she denies melena or hematochezia.  Please see problem based Assessment and Plan for status of patients chronic conditions.  Past Medical History:  Diagnosis Date  . Adenomatous colon polyp   . Allergic rhinitis, seasonal   . Allergy   . Beta thalassemia trait 11/25/2013  . Cholelithiasis   . Class 3 obesity without serious comorbidity with body mass index (BMI) of 40.0 to 44.9 in adult 11/19/2012  . GERD (gastroesophageal reflux disease)   . HLD (hyperlipidemia)   . Hypercholesterolemia   . Hypertension 03/18/2017   no meds   . Intraductal papilloma of left breast    Patient underwent left needle-localized lumpectomy by Dr. Imogene Burn. Tsuei on 09/09/2013; pathology showed intraductal papilloma with no atypia or malignancy identified.  Marland Kitchen PONV (postoperative nausea and vomiting)   . Prediabetes 03/18/2017   History of elevated glucose with A1c of 5.8% in 2013. This is consistent with obesity and possible metabolic syndrome. Plan to check hemoglobin A1c today and annually.  Marland Kitchen Uterine fibroid     Review of Systems:   Per HPI  Physical Exam:  Vitals:   01/29/19 1124  BP: (!) 157/89  Pulse: 90  Temp: 97.9 F (36.6 C)  TempSrc: Oral  SpO2: 99%  Weight: 234 lb 9.6 oz (106.4 kg)  Height: 5\' 4"  (1.626 m)   GENERAL- alert, co-operative, appears as stated age, initially in moderate distress. HEENT- oral mucosa appears moist. CARDIAC- RRR, no murmurs, rubs or  gallops. RESP- Moving equal volumes of air, and clear to auscultation bilaterally, no wheezes or crackles. ABDOMEN- Soft, hyperactive bowel sounds, no rebound, no guarding. TTP of epigastric region with radiation to bil upper quadrant to lesser extent.   Assessment & Plan:   See Encounters Tab for problem based charting.   Patient discussed with Dr. Verdie Drown, MD Internal Medicine PGY-3

## 2019-01-29 NOTE — Telephone Encounter (Signed)
Walk-in. C/o abd pain which started this am around 7 AM; she thinks it maybe food poisoning. Only ate black grapes and water this morning. She tried Pepto-Bismol which has not helped. Talked to Dr Darnell Level, attending. Dr Jari Favre agreed to see pt in [facility]. Lauren RN informed.

## 2019-01-30 NOTE — Progress Notes (Signed)
Medicine attending: Medical history, presenting problems, physical findings, and medications, reviewed with resident physician Dr Gorica Svalina on the day of the patient visit and I concur with her evaluation and management plan. 

## 2019-02-07 ENCOUNTER — Encounter (HOSPITAL_COMMUNITY): Payer: Self-pay

## 2019-02-07 ENCOUNTER — Other Ambulatory Visit: Payer: Self-pay

## 2019-02-07 ENCOUNTER — Emergency Department (HOSPITAL_COMMUNITY)
Admission: EM | Admit: 2019-02-07 | Discharge: 2019-02-07 | Disposition: A | Discharge status: Home / Self Care | Payer: Medicare Other | Attending: Emergency Medicine | Admitting: Emergency Medicine

## 2019-02-07 ENCOUNTER — Emergency Department (HOSPITAL_COMMUNITY): Payer: Medicare Other

## 2019-02-07 DIAGNOSIS — I160 Hypertensive urgency: Secondary | ICD-10-CM | POA: Diagnosis not present

## 2019-02-07 DIAGNOSIS — Z79899 Other long term (current) drug therapy: Secondary | ICD-10-CM | POA: Diagnosis not present

## 2019-02-07 DIAGNOSIS — Z87891 Personal history of nicotine dependence: Secondary | ICD-10-CM | POA: Insufficient documentation

## 2019-02-07 DIAGNOSIS — I10 Essential (primary) hypertension: Secondary | ICD-10-CM | POA: Insufficient documentation

## 2019-02-07 DIAGNOSIS — R200 Anesthesia of skin: Secondary | ICD-10-CM | POA: Diagnosis present

## 2019-02-07 LAB — CBC
HCT: 39.8 % (ref 36.0–46.0)
Hemoglobin: 12.6 g/dL (ref 12.0–15.0)
MCH: 24.6 pg — ABNORMAL LOW (ref 26.0–34.0)
MCHC: 31.7 g/dL (ref 30.0–36.0)
MCV: 77.6 fL — ABNORMAL LOW (ref 80.0–100.0)
NRBC: 0 % (ref 0.0–0.2)
Platelets: 242 10*3/uL (ref 150–400)
RBC: 5.13 MIL/uL — ABNORMAL HIGH (ref 3.87–5.11)
RDW: 15.4 % (ref 11.5–15.5)
WBC: 5 10*3/uL (ref 4.0–10.5)

## 2019-02-07 LAB — URINALYSIS, ROUTINE W REFLEX MICROSCOPIC
Bilirubin Urine: NEGATIVE
Glucose, UA: NEGATIVE mg/dL
Ketones, ur: NEGATIVE mg/dL
Leukocytes, UA: NEGATIVE
Nitrite: NEGATIVE
PROTEIN: NEGATIVE mg/dL
Specific Gravity, Urine: 1.004 — ABNORMAL LOW (ref 1.005–1.030)
pH: 7 (ref 5.0–8.0)

## 2019-02-07 LAB — RAPID URINE DRUG SCREEN, HOSP PERFORMED
Amphetamines: NOT DETECTED
Barbiturates: NOT DETECTED
Benzodiazepines: NOT DETECTED
Cocaine: NOT DETECTED
OPIATES: NOT DETECTED
Tetrahydrocannabinol: NOT DETECTED

## 2019-02-07 LAB — COMPREHENSIVE METABOLIC PANEL
ALBUMIN: 3.5 g/dL (ref 3.5–5.0)
ALK PHOS: 84 U/L (ref 38–126)
ALT: 13 U/L (ref 0–44)
AST: 15 U/L (ref 15–41)
Anion gap: 10 (ref 5–15)
BILIRUBIN TOTAL: 0.6 mg/dL (ref 0.3–1.2)
BUN: 7 mg/dL — ABNORMAL LOW (ref 8–23)
CO2: 26 mmol/L (ref 22–32)
Calcium: 9.5 mg/dL (ref 8.9–10.3)
Chloride: 103 mmol/L (ref 98–111)
Creatinine, Ser: 0.97 mg/dL (ref 0.44–1.00)
GFR calc Af Amer: >60 mL/min (ref >60)
GFR calc non Af Amer: >60 mL/min (ref >60)
Glucose, Bld: 84 mg/dL (ref 70–99)
Potassium: 4 mmol/L (ref 3.5–5.1)
SODIUM: 139 mmol/L (ref 135–145)
TOTAL PROTEIN: 6.9 g/dL (ref 6.5–8.1)

## 2019-02-07 LAB — DIFFERENTIAL
Abs Immature Granulocytes: 0.01 10*3/uL (ref 0.00–0.07)
Basophils Absolute: 0 10*3/uL (ref 0.0–0.1)
Basophils Relative: 1 %
Eosinophils Absolute: 0.1 10*3/uL (ref 0.0–0.5)
Eosinophils Relative: 2 %
Immature Granulocytes: 0 %
Lymphocytes Relative: 27 %
Lymphs Abs: 1.4 10*3/uL (ref 0.7–4.0)
Monocytes Absolute: 0.5 10*3/uL (ref 0.1–1.0)
Monocytes Relative: 10 %
Neutro Abs: 3 10*3/uL (ref 1.7–7.7)
Neutrophils Relative %: 60 %

## 2019-02-07 LAB — PROTIME-INR
INR: 0.98
Prothrombin Time: 12.9 seconds (ref 11.4–15.2)

## 2019-02-07 LAB — CBG MONITORING, ED: Glucose-Capillary: 86 mg/dL (ref 70–99)

## 2019-02-07 LAB — APTT: aPTT: 29 seconds (ref 24–36)

## 2019-02-07 MED ORDER — SODIUM CHLORIDE 0.9 % IV SOLN
100.0000 mL/h | INTRAVENOUS | Status: DC
  Administered 2019-02-07: 100 mL/h via INTRAVENOUS

## 2019-02-07 MED ORDER — SODIUM CHLORIDE 0.9 % IV BOLUS
500.0000 mL | Freq: Once | INTRAVENOUS | Status: AC
  Administered 2019-02-07: 500 mL via INTRAVENOUS

## 2019-02-07 NOTE — ED Notes (Signed)
Patient verbalizes understanding of discharge instructions. Opportunity for questioning and answers were provided. Armband removed by staff, pt discharged from ED.  

## 2019-02-07 NOTE — ED Provider Notes (Signed)
[facility] EMERGENCY DEPARTMENT Provider Note   CSN: [id number] Arrival date & time: 02/07/19  1532     History   Chief Complaint Chief Complaint  Patient presents with  . Tachycardia  . Numbness    HPI Tina Stone is a 69 y.o. female.  HPI Presents with concern of numbness, tingling in her left distal biceps region, left distal lower extremity.  She also has a concern about tachycardia. Patient works as a Chartered loss adjuster. She notes that upon returning home about 5 hours prior to ED arrival the patient noticed numbness in her extremities, tachycardia appreciable with palpation of her radial artery. She denies any sense of palpitations, or any chest pain or dyspnea. No clear precipitant for the symptoms, which have improved substantially prior to ED arrival. Patient acknowledges a history of hypercholesterolemia, for which she does not take medication currently. She denies history of hypertension.    Past Medical History:  Diagnosis Date  . Adenomatous colon polyp   . Allergic rhinitis, seasonal   . Allergy   . Beta thalassemia trait 11/25/2013  . Cholelithiasis   . Class 3 obesity without serious comorbidity with body mass index (BMI) of 40.0 to 44.9 in adult 11/19/2012  . GERD (gastroesophageal reflux disease)   . HLD (hyperlipidemia)   . Hypercholesterolemia   . Hypertension 03/18/2017   no meds   . Intraductal papilloma of left breast    Patient underwent left needle-localized lumpectomy by Dr. Imogene Burn. Tsuei on 09/09/2013; pathology showed intraductal papilloma with no atypia or malignancy identified.  Marland Kitchen PONV (postoperative nausea and vomiting)   . Prediabetes 03/18/2017   History of elevated glucose with A1c of 5.8% in 2013. This is consistent with obesity and possible metabolic syndrome. Plan to check hemoglobin A1c today and annually.  Marland Kitchen Uterine fibroid     Patient Active Problem List   Diagnosis Date Noted  . Epigastric pain  01/29/2019  . Acute diarrhea 10/13/2018  . Prediabetes 03/18/2017  . Hypertension 03/18/2017  . Beta thalassemia trait 11/25/2013  . Obesity 11/19/2012  . Preventative health care 07/12/2011  . Gastroesophageal reflux disease 01/28/2007  . Hyperlipidemia 10/10/2006    Past Surgical History:  Procedure Laterality Date  . [place]   right  . BREAST EXCISIONAL BIOPSY Left 2014   benign  . BREAST LUMPECTOMY WITH NEEDLE LOCALIZATION Left 09/09/2013   Procedure: BREAST LUMPECTOMY WITH NEEDLE LOCALIZATION;  Surgeon: Imogene Burn. Georgette Dover, MD;  Location: [facility];  Service: General;  Laterality: Left;  . COLONOSCOPY    . POLYPECTOMY       OB History   No obstetric history on file.      Home Medications    Prior to Admission medications   Medication Sig Start Date End Date Taking? Authorizing Provider  ALOE VERA PO Take by mouth. 2 tbsp prn    [provider]  APPLE CIDER VINEGAR PO Take by mouth. 1 tsp daily    [provider]  calcium carbonate (TUMS - DOSED IN MG ELEMENTAL CALCIUM) 500 MG chewable tablet Chew 1 tablet by mouth as needed for indigestion or heartburn.    [provider]  meclizine (ANTIVERT) 25 MG tablet Take 1 tablet (25 mg total) by mouth 3 (three) times daily as needed for dizziness. Patient not taking: Reported on 09/22/2018 08/23/18   Maudie Flakes, MD  omeprazole (PRILOSEC) 20 MG capsule Take 1 capsule (20 mg total) by mouth daily. 11/19/18  Axel Filler, MD  ondansetron [facility]) 4 MG tablet Take 1 tablet (4 mg total) by mouth every 8 (eight) hours as needed for nausea or vomiting. 01/29/19 01/29/20  Alphonzo Grieve, MD    Family History Family History  Problem Relation Age of Onset  . Diabetes Mother   . Hypertension Mother   . Colon polyps Mother 39  . Dementia Mother 63  . Diabetes Father   . Congestive Heart Failure Father   . Pancreatic cancer Paternal Aunt   . Colon cancer Neg Hx   . Breast cancer  Neg Hx   . Lung cancer Neg Hx   . Esophageal cancer Neg Hx   . Rectal cancer Neg Hx   . Stomach cancer Neg Hx     Social History Social History   Tobacco Use  . Smoking status: Former Research scientist (life sciences)  . Smokeless tobacco: Never Used  . Tobacco comment: few puffs but not a true smoker   Substance Use Topics  . Alcohol use: Never    Frequency: Never  . Drug use: Never     Allergies   Bactrim [sulfamethoxazole-trimethoprim]; Pravastatin; Shellfish allergy; and Shellfish allergy   Review of Systems Review of Systems  Constitutional:       Per HPI, otherwise negative  HENT:       Per HPI, otherwise negative  Respiratory:       Per HPI, otherwise negative  Cardiovascular:       Per HPI, otherwise negative  Gastrointestinal: Negative for vomiting.  Endocrine:       Negative aside from HPI  Genitourinary:       Neg aside from HPI   Musculoskeletal:       Per HPI, otherwise negative  Skin: Negative.   Neurological: Positive for numbness. Negative for syncope.     Physical Exam Updated Vital Signs BP 127/76   Pulse 79   Temp 98.1 F (36.7 C) (Oral)   Resp 12   Ht 5\' 4"  (1.626 m)   Wt 106 kg   SpO2 100%   BMI 40.11 kg/m   Physical Exam Vitals signs and nursing note reviewed.  Constitutional:      General: She is not in acute distress.    Appearance: She is well-developed.  HENT:     Head: Normocephalic and atraumatic.  Eyes:     Conjunctiva/sclera: Conjunctivae normal.  Cardiovascular:     Rate and Rhythm: Normal rate and regular rhythm.  Pulmonary:     Effort: Pulmonary effort is normal. No respiratory distress.     Breath sounds: Normal breath sounds. No stridor.  Abdominal:     General: There is no distension.  Skin:    General: Skin is warm and dry.  Neurological:     Mental Status: She is alert and oriented to person, place, and time.     Cranial Nerves: No cranial nerve deficit.     Sensory: No sensory deficit.     Motor: No weakness, tremor or  pronator drift.     Coordination: Coordination normal.      ED Treatments / Results  Labs (all labs ordered are listed, but only abnormal results are displayed) Labs Reviewed  CBC - Abnormal; Notable for the following components:      Result Value   RBC 5.13 (*)    MCV 77.6 (*)    MCH 24.6 (*)    All other components within normal limits  COMPREHENSIVE METABOLIC PANEL - Abnormal; Notable for the following components:  BUN 7 (*)    All other components within normal limits  URINALYSIS, ROUTINE W REFLEX MICROSCOPIC - Abnormal; Notable for the following components:   Color, Urine STRAW (*)    Specific Gravity, Urine 1.004 (*)    Hgb urine dipstick SMALL (*)    Bacteria, UA RARE (*)    All other components within normal limits  PROTIME-INR  APTT  DIFFERENTIAL  RAPID URINE DRUG SCREEN, HOSP PERFORMED  CBG MONITORING, ED    EKG Sinus rhythm, rate 89, unremarkable EKG  Radiology Ct Head Wo Contrast  Result Date: 02/07/2019 CLINICAL DATA:  Focal neurologic deficit with left foot numbness and tingling. EXAM: CT HEAD WITHOUT CONTRAST TECHNIQUE: Contiguous axial images were obtained from the base of the skull through the vertex without intravenous contrast. COMPARISON:  02/24/2018 FINDINGS: Brain: The brainstem, cerebellum, cerebral peduncles, thalami, basal ganglia, basilar cisterns, and ventricular system appear within normal limits. No intracranial hemorrhage, mass lesion, or acute CVA. Stable dilated perivascular space or small remote lacunar infarct in the left caudate head on image 27/5, unchanged from prior exams. Vascular: There is atherosclerotic calcification of the cavernous carotid arteries bilaterally. Skull: Unremarkable Sinuses/Orbits: Unremarkable Other: No supplemental non-categorized findings. IMPRESSION: 1. No acute intracranial findings. 2. There is atherosclerotic calcification of the cavernous carotid arteries bilaterally. 3. Dilated perivascular space or small remote  lacunar infarct of the left caudate head as shown on prior exams. Electronically Signed   By: Van Clines M.D.   On: 02/07/2019 17:25    Procedures Procedures (including critical care time)  Medications Ordered in ED Medications  sodium chloride 0.9 % bolus 500 mL (0 mLs Intravenous Stopped 02/07/19 1645)    Followed by  0.9 %  sodium chloride infusion (100 mL/hr Intravenous New Bag/Given 02/07/19 1645)     Initial Impression / Assessment and Plan / ED Course  I have reviewed the triage vital signs and the nursing notes.  Pertinent labs & imaging results that were available during my care of the patient were reviewed by me and considered in my medical decision making (see chart for details).     6:35 PM Patient in no distress on repeat exam, blood pressure normal, no ongoing complaints per With a lengthy conversation about labs, CT, no evidence for stroke, no evidence for endorgan damage from her mildly elevated blood pressure. We discussed possibilities for her episode of numbness, some suspicion for changes related to her blood pressure Absent evidence for stroke, but with history of hypercholesterolemia, and with at least episodic hypertension, the patient will follow-up with primary care for additional consideration of medications, appropriate stroke prophylaxis interventions. With no ongoing complaints, no hemodynamic instability, patient is appropriate for close outpatient follow-up.  Final Clinical Impressions(s) / ED Diagnoses  Numbness Hypertensive urgency   Carmin Muskrat, MD 02/07/19 1836

## 2019-02-07 NOTE — Discharge Instructions (Addendum)
As discussed, your evaluation today has been largely reassuring.  But, it is important that you monitor your condition carefully, and do not hesitate to return to the ED if you develop new, or concerning changes in your condition.  Otherwise, please follow-up with your physician for appropriate ongoing care.  These be sure to discuss today's evaluation, and consideration of medication for your cholesterol levels, as well as for today's episodes of hypertension.

## 2019-02-07 NOTE — ED Notes (Signed)
Patient transported to CT 

## 2019-02-07 NOTE — ED Triage Notes (Signed)
Pt to ED per POV w/ a c/o of a fast pulse rate and left arm and left foot numbness/tingling. Symptoms began at approximately 1030 today when she got off of work. Pt denied left sided weakness. No slurred speech noted. CAOx4. Denied PMH of stroke or cardiac events.

## 2019-02-09 MED FILL — LOSARTAN-HCTZ 50-12.5 MG TA: 50-12.5 | 30 days supply | Qty: 30 | Fill #0

## 2019-02-10 ENCOUNTER — Emergency Department (HOSPITAL_COMMUNITY)
Admission: EM | Admit: 2019-02-10 | Discharge: 2019-02-11 | Disposition: A | Discharge status: Home / Self Care | Payer: Medicare Other | Attending: Emergency Medicine | Admitting: Emergency Medicine

## 2019-02-10 ENCOUNTER — Other Ambulatory Visit: Payer: Self-pay

## 2019-02-10 ENCOUNTER — Other Ambulatory Visit: Payer: Self-pay | Admitting: Internal Medicine

## 2019-02-10 ENCOUNTER — Encounter (HOSPITAL_COMMUNITY): Payer: Self-pay | Admitting: Emergency Medicine

## 2019-02-10 DIAGNOSIS — R002 Palpitations: Secondary | ICD-10-CM | POA: Diagnosis not present

## 2019-02-10 DIAGNOSIS — R55 Syncope and collapse: Secondary | ICD-10-CM | POA: Diagnosis present

## 2019-02-10 DIAGNOSIS — I6381 Other cerebral infarction due to occlusion or stenosis of small artery: Secondary | ICD-10-CM

## 2019-02-10 DIAGNOSIS — Z79899 Other long term (current) drug therapy: Secondary | ICD-10-CM | POA: Diagnosis not present

## 2019-02-10 DIAGNOSIS — I10 Essential (primary) hypertension: Secondary | ICD-10-CM | POA: Diagnosis not present

## 2019-02-10 DIAGNOSIS — Z87891 Personal history of nicotine dependence: Secondary | ICD-10-CM | POA: Diagnosis not present

## 2019-02-10 MED ORDER — SODIUM CHLORIDE 0.9% FLUSH: 3.0000 mL | Freq: Once | INTRAVENOUS | Status: DC

## 2019-02-10 NOTE — ED Triage Notes (Signed)
Patient here after starting medication today.  She states that she took a blood pressure medication and she started having fainty feelings, she had palpitations and shortness of breath.  She still has the fainty feeling, mild shortness of breath.

## 2019-02-11 ENCOUNTER — Emergency Department (HOSPITAL_COMMUNITY): Payer: Medicare Other

## 2019-02-11 LAB — CBC
HEMATOCRIT: 40.8 % (ref 36.0–46.0)
HEMOGLOBIN: 12.9 g/dL (ref 12.0–15.0)
MCH: 24.2 pg — ABNORMAL LOW (ref 26.0–34.0)
MCHC: 31.6 g/dL (ref 30.0–36.0)
MCV: 76.4 fL — ABNORMAL LOW (ref 80.0–100.0)
Platelets: 250 10*3/uL (ref 150–400)
RBC: 5.34 MIL/uL — ABNORMAL HIGH (ref 3.87–5.11)
RDW: 14.8 % (ref 11.5–15.5)
WBC: 4.8 10*3/uL (ref 4.0–10.5)
nRBC: 0 % (ref 0.0–0.2)

## 2019-02-11 LAB — BASIC METABOLIC PANEL
Anion gap: 12 (ref 5–15)
BUN: 9 mg/dL (ref 8–23)
CHLORIDE: 105 mmol/L (ref 98–111)
CO2: 23 mmol/L (ref 22–32)
Calcium: 9.5 mg/dL (ref 8.9–10.3)
Creatinine, Ser: 0.95 mg/dL (ref 0.44–1.00)
GFR calc Af Amer: >60 mL/min (ref >60)
GFR calc non Af Amer: >60 mL/min (ref >60)
Glucose, Bld: 126 mg/dL — ABNORMAL HIGH (ref 70–99)
POTASSIUM: 3.3 mmol/L — AB (ref 3.5–5.1)
Sodium: 140 mmol/L (ref 135–145)

## 2019-02-11 LAB — I-STAT TROPONIN, ED: Troponin i, poc: 0.01 ng/mL (ref 0.00–0.08)

## 2019-02-11 MED FILL — POTASSIUM CHLORIDE CRYS ER: 10 | 5 days supply | Qty: 5 | Fill #0

## 2019-02-11 MED FILL — LOSARTAN POTASSIUM 25 MG TA: 25 | 30 days supply | Qty: 30 | Fill #0

## 2019-02-11 NOTE — ED Notes (Signed)
ED Provider at bedside. 

## 2019-02-11 NOTE — Discharge Instructions (Addendum)
We suspect that your symptoms were due to the start of your new blood pressure medication.  We recommend that you discontinue this medication and consult your doctor about the symptoms you experienced.  Drink plenty of water to prevent dehydration.  Try to limit your sodium intake by decreasing your consumption of salt and processed foods as well as canned goods.  You may return to the emergency department for new or concerning symptoms.

## 2019-02-11 NOTE — ED Notes (Signed)
Patient verbalized understanding of dc instructions, vss, ambulatory with nad.   

## 2019-02-11 NOTE — ED Provider Notes (Signed)
[facility] EMERGENCY DEPARTMENT Provider Note   CSN: [id number] Arrival date & time: 02/10/19  2322     History   Chief Complaint Chief Complaint  Patient presents with  . Near Syncope    HPI Tina Stone is a 69 y.o. female.  69 year old female with a history of esophageal reflux, dyslipidemia, hypertension presents to the emergency department for palpitations.  The patient saw her primary care doctor 2 days ago in follow-up following last ED visit 4 days ago.  Was noted to be hypertensive in the office and was started on losartan 50 mg/hydrochlorothiazide 12.5 mg by her PCP.  Patient took the first dose of this medicine in the morning.  She tried to hydrate throughout the day, but noticed increased urinary frequency in the afternoon.  States that she was needing to void approximately every 30 minutes.  Shortly after onset of frequency, she began to notice palpitations characterized as a rapid heart rate.  Patient further endorses associated lightheadedness without syncope as well as vague shortness of breath.  Notes that her symptoms have spontaneously resolved.  Denies any associated fevers, chest pain, cough, vomiting.  Does endorse poor sleeping habits, but overall does not feel that she has been under increased stress.  Notes that she could improve her diet by eating less processed/salty foods.  No prior hx of ACS.  The history is provided by the patient. No language interpreter was used.  Near Syncope     Past Medical History:  Diagnosis Date  . Adenomatous colon polyp   . Allergic rhinitis, seasonal   . Allergy   . Beta thalassemia trait 11/25/2013  . Cholelithiasis   . Class 3 obesity without serious comorbidity with body mass index (BMI) of 40.0 to 44.9 in adult 11/19/2012  . GERD (gastroesophageal reflux disease)   . HLD (hyperlipidemia)   . Hypercholesterolemia   . Hypertension 03/18/2017   no meds   . Intraductal papilloma of left breast    Patient underwent left needle-localized lumpectomy by Dr. Imogene Burn. Tsuei on 09/09/2013; pathology showed intraductal papilloma with no atypia or malignancy identified.  Marland Kitchen PONV (postoperative nausea and vomiting)   . Prediabetes 03/18/2017   History of elevated glucose with A1c of 5.8% in 2013. This is consistent with obesity and possible metabolic syndrome. Plan to check hemoglobin A1c today and annually.  Marland Kitchen Uterine fibroid     Patient Active Problem List   Diagnosis Date Noted  . Epigastric pain 01/29/2019  . Acute diarrhea 10/13/2018  . Prediabetes 03/18/2017  . Hypertension 03/18/2017  . Beta thalassemia trait 11/25/2013  . Obesity 11/19/2012  . Preventative health care 07/12/2011  . Gastroesophageal reflux disease 01/28/2007  . Hyperlipidemia 10/10/2006    Past Surgical History:  Procedure Laterality Date  . [place]   right  . BREAST EXCISIONAL BIOPSY Left 2014   benign  . BREAST LUMPECTOMY WITH NEEDLE LOCALIZATION Left 09/09/2013   Procedure: BREAST LUMPECTOMY WITH NEEDLE LOCALIZATION;  Surgeon: Imogene Burn. Georgette Dover, MD;  Location: [facility];  Service: General;  Laterality: Left;  . COLONOSCOPY    . POLYPECTOMY       OB History   No obstetric history on file.      Home Medications    Prior to Admission medications   Medication Sig Start Date End Date Taking? Authorizing Provider  ALOE VERA PO Take by mouth. 2 tbsp prn    [provider]  APPLE CIDER VINEGAR PO Take by  mouth. 1 tsp daily    [provider]  calcium carbonate (TUMS - DOSED IN MG ELEMENTAL CALCIUM) 500 MG chewable tablet Chew 1 tablet by mouth as needed for indigestion or heartburn.    [provider]  meclizine (ANTIVERT) 25 MG tablet Take 1 tablet (25 mg total) by mouth 3 (three) times daily as needed for dizziness. Patient not taking: Reported on 09/22/2018 08/23/18   Maudie Flakes, MD  omeprazole (PRILOSEC) 20 MG capsule Take 1 capsule (20 mg total) by mouth  daily. 11/19/18   Axel Filler, MD  ondansetron (ZOFRAN) 4 MG tablet Take 1 tablet (4 mg total) by mouth every 8 (eight) hours as needed for nausea or vomiting. 01/29/19 01/29/20  Alphonzo Grieve, MD    Family History Family History  Problem Relation Age of Onset  . Diabetes Mother   . Hypertension Mother   . Colon polyps Mother 77  . Dementia Mother 17  . Diabetes Father   . Congestive Heart Failure Father   . Pancreatic cancer Paternal Aunt   . Colon cancer Neg Hx   . Breast cancer Neg Hx   . Lung cancer Neg Hx   . Esophageal cancer Neg Hx   . Rectal cancer Neg Hx   . Stomach cancer Neg Hx     Social History Social History   Tobacco Use  . Smoking status: Former Research scientist (life sciences)  . Smokeless tobacco: Never Used  . Tobacco comment: few puffs but not a true smoker   Substance Use Topics  . Alcohol use: Never    Frequency: Never  . Drug use: Never     Allergies   Bactrim [sulfamethoxazole-trimethoprim]; Pravastatin; Shellfish allergy; and Shellfish allergy   Review of Systems Review of Systems  Cardiovascular: Positive for near-syncope.  Ten systems reviewed and are negative for acute change, except as noted in the HPI.    Physical Exam Updated Vital Signs BP 129/77   Pulse 74   Temp 97.6 F (36.4 C) (Oral)   Resp 18   SpO2 98%   Physical Exam Vitals signs and nursing note reviewed.  Constitutional:      General: She is not in acute distress.    Appearance: She is well-developed. She is not diaphoretic.     Comments: Nontoxic appearing and in NAD  HENT:     Head: Normocephalic and atraumatic.  Eyes:     General: No scleral icterus.    Conjunctiva/sclera: Conjunctivae normal.  Neck:     Musculoskeletal: Normal range of motion.  Cardiovascular:     Rate and Rhythm: Normal rate and regular rhythm.     Pulses: Normal pulses.  Pulmonary:     Effort: Pulmonary effort is normal. No respiratory distress.     Breath sounds: No stridor. No wheezing or  rales.     Comments: Lungs CTAB. Respirations even and unlabored. Musculoskeletal: Normal range of motion.  Skin:    General: Skin is warm and dry.     Coloration: Skin is not pale.     Findings: No erythema or rash.  Neurological:     General: No focal deficit present.     Mental Status: She is alert and oriented to person, place, and time.     Coordination: Coordination normal.  Psychiatric:        Behavior: Behavior normal.      ED Treatments / Results  Labs (all labs ordered are listed, but only abnormal results are displayed) Labs Reviewed  BASIC METABOLIC  PANEL - Abnormal; Notable for the following components:      Result Value   Potassium 3.3 (*)    Glucose, Bld 126 (*)    All other components within normal limits  CBC - Abnormal; Notable for the following components:   RBC 5.34 (*)    MCV 76.4 (*)    MCH 24.2 (*)    All other components within normal limits  I-STAT TROPONIN, ED    EKG EKG Interpretation  Date/Time:  Tuesday February 10 2019 23:27:48 EST Ventricular Rate:  104 PR Interval:  176 QRS Duration: 68 QT Interval:  328 QTC Calculation: 431 R Axis:   64 Text Interpretation:  Sinus tachycardia Biatrial enlargement Low voltage QRS Abnormal ECG When compared with ECG of 02/07/2019, HEART RATE has increased Confirmed by Delora Fuel ([id number]) on 02/10/2019 11:45:48 PM   Radiology Dg Chest 2 View  Result Date: 02/11/2019 CLINICAL DATA:  Syncope EXAM: CHEST - 2 VIEW COMPARISON:  None. FINDINGS: The heart size and mediastinal contours are within normal limits. Both lungs are clear. The visualized skeletal structures are unremarkable. IMPRESSION: No active cardiopulmonary disease. Electronically Signed   By: Ulyses Jarred M.D.   On: 02/11/2019 00:29    Procedures Procedures (including critical care time)  Medications Ordered in ED Medications  sodium chloride flush (NS) 0.9 % injection 3 mL (has no administration in time range)     Initial Impression /  Assessment and Plan / ED Course  I have reviewed the triage vital signs and the nursing notes.  Pertinent labs & imaging results that were available during my care of the patient were reviewed by me and considered in my medical decision making (see chart for details).     69 year old female presenting for palpitations and lightheadedness which began after taking first dose of a new blood pressure medication.  Was recently prescribed losartan 50 mg/hydrochlorothiazide 12.5 mg by her PCP.  Given half-life of hydrochlorothiazide, suspect that patient experienced an episode of hypotension and palpitations secondary to hypovolemia; likely from increased urinary frequency and limited oral fluid intake.  Her symptoms have spontaneously subsided.  She experienced no associated chest pain and denies syncope.  Patient initially tachycardic in triage, though this has resolved.  Her exam is reassuring and her cardiac work-up today does not suggest acute ischemic event.  Troponin is negative.  I have encouraged that the primary discontinue her blood pressure medication and reach out to her primary care doctor regarding recommendations for continued management.  Return precautions discussed and provided. Patient discharged in stable condition with no unaddressed concerns.   Final Clinical Impressions(s) / ED Diagnoses   Final diagnoses:  Palpitations    ED Discharge Orders    None       Antonietta Breach, PA-C 02/11/19 0321    Ripley Fraise, MD 02/11/19 [phone]

## 2019-02-13 ENCOUNTER — Ambulatory Visit (INDEPENDENT_AMBULATORY_CARE_PROVIDER_SITE_OTHER): Payer: Medicare Other | Admitting: Family Medicine

## 2019-02-13 ENCOUNTER — Encounter: Payer: Self-pay | Admitting: Family Medicine

## 2019-02-13 VITALS — BP 162/88 | HR 96 | Temp 97.6°F | Ht 64.0 in | Wt 226.0 lb

## 2019-02-13 DIAGNOSIS — I10 Essential (primary) hypertension: Secondary | ICD-10-CM | POA: Diagnosis not present

## 2019-02-13 DIAGNOSIS — K219 Gastro-esophageal reflux disease without esophagitis: Secondary | ICD-10-CM | POA: Diagnosis not present

## 2019-02-13 DIAGNOSIS — E876 Hypokalemia: Secondary | ICD-10-CM | POA: Diagnosis not present

## 2019-02-13 DIAGNOSIS — R7309 Other abnormal glucose: Secondary | ICD-10-CM | POA: Diagnosis not present

## 2019-02-13 LAB — BASIC METABOLIC PANEL
BUN: 12 mg/dL (ref 6–23)
CO2: 29 mEq/L (ref 19–32)
Calcium: 9.3 mg/dL (ref 8.4–10.5)
Chloride: 100 mEq/L (ref 96–112)
Creatinine, Ser: 1.04 mg/dL (ref 0.40–1.20)
GFR: 64.12 mL/min (ref >60.00)
Glucose, Bld: 87 mg/dL (ref 70–99)
Potassium: 4.1 mEq/L (ref 3.5–5.1)
Sodium: 138 mEq/L (ref 135–145)

## 2019-02-13 LAB — HEMOGLOBIN A1C: Hgb A1c MFr Bld: 5.5 % (ref 4.6–6.5)

## 2019-02-13 LAB — TSH: TSH: 2.34 u[IU]/mL (ref 0.35–4.50)

## 2019-02-13 MED ORDER — AMLODIPINE BESYLATE 5 MG PO TABS: 5.0000 mg | ORAL_TABLET | Freq: Every day | ORAL | 0 refills | Status: DC

## 2019-02-13 MED FILL — AMLODIPINE BESYLATE 5 MG TA: 5 | 90 days supply | Qty: 90 | Fill #0

## 2019-02-13 NOTE — Assessment & Plan Note (Signed)
Intermittent flares, worse with acidic foods.  She will continues tums prn.

## 2019-02-13 NOTE — Progress Notes (Signed)
Tina Stone - 69 y.o. female MRN [id number]  Date of birth: [birth year]  Subjective Chief Complaint  Patient presents with  . Hypertension    wants to discuss her elevated BP-has tried taking BP meds-cannot take due to side effects    HPI Tina Stone is a 69 y.o. female with history of HTN, HLD and prediabetes here today for initial visit and to discuss HTN.  She was previously treated with losartan however she reports that she had reaction to this medication that caused her to have palpitations.  She is hesitant to start on new medication.  She wants to try natural supplements to lower her blood pressure.  She goes to the [facility] a couple times per week for exercise and is a non-smoker.  She denies symptoms related to HTN including chest pain, shortness of breath, palpitations, headache or vision changes.   ROS:  A comprehensive ROS was completed and negative except as noted per HPI  Allergies  Allergen Reactions  . Bactrim [Sulfamethoxazole-Trimethoprim] Other (See Comments)    Abdominal pain, dizziness  . Pravastatin Other (See Comments)    Lower abdominal pain  . Shellfish Allergy Nausea And Vomiting    Past Medical History:  Diagnosis Date  . Adenomatous colon polyp   . Allergic rhinitis, seasonal   . Allergy   . Beta thalassemia trait 11/25/2013  . Cholelithiasis   . Class 3 obesity without serious comorbidity with body mass index (BMI) of 40.0 to 44.9 in adult 11/19/2012  . GERD (gastroesophageal reflux disease)   . HLD (hyperlipidemia)   . Hypercholesterolemia   . Hypertension 03/18/2017   no meds   . Intraductal papilloma of left breast    Patient underwent left needle-localized lumpectomy by Dr. Imogene Burn. Tsuei on 09/09/2013; pathology showed intraductal papilloma with no atypia or malignancy identified.  Marland Kitchen PONV (postoperative nausea and vomiting)   . Prediabetes 03/18/2017   History of elevated glucose with A1c of 5.8% in 2013. This is consistent with obesity and  possible metabolic syndrome. Plan to check hemoglobin A1c today and annually.  Marland Kitchen Uterine fibroid     Past Surgical History:  Procedure Laterality Date  . [place]   right  . BREAST EXCISIONAL BIOPSY Left 2014   benign  . BREAST LUMPECTOMY WITH NEEDLE LOCALIZATION Left 09/09/2013   Procedure: BREAST LUMPECTOMY WITH NEEDLE LOCALIZATION;  Surgeon: Imogene Burn. Georgette Dover, MD;  Location: [facility];  Service: General;  Laterality: Left;  . COLONOSCOPY    . POLYPECTOMY      Social History   Socioeconomic History  . Marital status: Widowed    Spouse name: Not on file  . Number of children: Not on file  . Years of education: Not on file  . Highest education level: Not on file  Occupational History  . Not on file  Social Needs  . Financial resource strain: Not on file  . Food insecurity:    Worry: Not on file    Inability: Not on file  . Transportation needs:    Medical: Not on file    Non-medical: Not on file  Tobacco Use  . Smoking status: Former Research scientist (life sciences)  . Smokeless tobacco: Never Used  . Tobacco comment: few puffs but not a true smoker   Substance and Sexual Activity  . Alcohol use: Never    Frequency: Never  . Drug use: Never  . Sexual activity: Not on file  Lifestyle  . Physical activity:    Days  per week: Not on file    Minutes per session: Not on file  . Stress: Not on file  Relationships  . Social connections:    Talks on phone: Not on file    Gets together: Not on file    Attends religious service: Not on file    Active member of club or organization: Not on file    Attends meetings of clubs or organizations: Not on file    Relationship status: Not on file  Other Topics Concern  . 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 Diabetes Mother   . Hypertension Mother   . Colon  polyps Mother 53  . Dementia Mother 1  . Diabetes Father   . Congestive Heart Failure Father   . Pancreatic cancer Paternal Aunt   . Colon cancer Neg Hx   . Breast cancer Neg Hx   . Lung cancer Neg Hx   . Esophageal cancer Neg Hx   . Rectal cancer Neg Hx   . Stomach cancer Neg Hx     Health Maintenance  Topic Date Due  . PNA vac Low Risk Adult (2 of 2 - PPSV23) 05/13/2019  . MAMMOGRAM  07/28/2020  . TETANUS/TDAP  08/05/2023  . COLONOSCOPY  10/07/2023  . INFLUENZA VACCINE  Completed  . DEXA SCAN  Completed  . Hepatitis C Screening  Completed    ----------------------------------------------------------------------------------------------------------------------------------------------------------------------------------------------------------------- Physical Exam BP (!) 162/88   Pulse 96   Temp 97.6 F (36.4 C) (Oral)   Ht 5\' 4"  (1.626 m)   Wt 226 lb (102.5 kg)   SpO2 100%   BMI 38.79 kg/m   Physical Exam Constitutional:      Appearance: Normal appearance.  HENT:     Head: Normocephalic and atraumatic.     Mouth/Throat:     Mouth: Mucous membranes are moist.  Eyes:     General: No scleral icterus. Neck:     Musculoskeletal: Neck supple.  Cardiovascular:     Rate and Rhythm: Normal rate and regular rhythm.  Pulmonary:     Effort: Pulmonary effort is normal.     Breath sounds: Normal breath sounds.  Musculoskeletal:     Right lower leg: No edema.     Left lower leg: No edema.  Lymphadenopathy:     Cervical: No cervical adenopathy.  Skin:    General: Skin is warm and dry.     Findings: No rash.  Neurological:     General: No focal deficit present.     Mental Status: She is alert.  Psychiatric:        Mood and Affect: Mood normal.        Behavior: Behavior normal.      ------------------------------------------------------------------------------------------------------------------------------------------------------------------------------------------------------------------- Assessment and Plan  Gastroesophageal reflux disease Intermittent flares, worse with acidic foods.  She will continues tums prn.   Hypertension -BP elevated today, hesitant to start medications but willing to try amlodipine.  -Discussed healthy diet and exercise, high potassium and low sodium diet.  -Continue regular exercise.   Elevated glucose Check a1c

## 2019-02-13 NOTE — Assessment & Plan Note (Addendum)
-  BP elevated today, hesitant to start medications but willing to try amlodipine.  -Discussed healthy diet and exercise, high potassium and low sodium diet.  -Continue regular exercise.

## 2019-02-13 NOTE — Assessment & Plan Note (Signed)
Check a1c 

## 2019-02-13 NOTE — Patient Instructions (Signed)
-  Great to meet you -Start amlodipine for your blood pressure -See me again in 6 weeks.

## 2019-02-19 ENCOUNTER — Other Ambulatory Visit: Payer: Medicare Other

## 2019-02-25 ENCOUNTER — Ambulatory Visit (INDEPENDENT_AMBULATORY_CARE_PROVIDER_SITE_OTHER): Payer: Medicare Other | Admitting: Family Medicine

## 2019-02-25 ENCOUNTER — Ambulatory Visit: Payer: Self-pay

## 2019-02-25 ENCOUNTER — Encounter: Payer: Self-pay | Admitting: Family Medicine

## 2019-02-25 ENCOUNTER — Telehealth: Payer: Self-pay | Admitting: Family Medicine

## 2019-02-25 DIAGNOSIS — I10 Essential (primary) hypertension: Secondary | ICD-10-CM | POA: Diagnosis not present

## 2019-02-25 MED ORDER — OLMESARTAN MEDOXOMIL 20 MG PO TABS: 20.0000 mg | ORAL_TABLET | Freq: Every day | ORAL | 0 refills | Status: DC

## 2019-02-25 MED FILL — OLMESARTAN MEDOXOMIL 20 MG: 20 | 90 days supply | Qty: 90 | Fill #0

## 2019-02-25 NOTE — Telephone Encounter (Signed)
Attempted to call pt. To discuss her reported side effects of Amlodipine.  Left vm to return call to office to speak to a nurse.

## 2019-02-25 NOTE — Assessment & Plan Note (Addendum)
-  Uncontrolled at this time.  -Amlodipine d/c'ed and added to allergy/intolerance list.  -Start olmesartan 20mg  -f/u in 4-6 weeks.

## 2019-02-25 NOTE — Telephone Encounter (Signed)
Copied from [place] [id number]. Topic: Quick Communication - Rx Refill/Question >> Feb 25, 2019  9:54 AM Gustavus Messing wrote: Medication: amLODipine (NORVASC) 5 MG tablet  Has the patient contacted their pharmacy? No. (Agent: If no, request that the patient contact the pharmacy for the refill.) Patient is having side effects with the blood Pressure medicine and wants to change medicine. She is having heart palpitations ans weakness. She feels that the medication is not correct for her blood type   Preferred Pharmacy (with phone number or street name): [facility], [place] - [address]. [phone] (Phone) [phone] (Fax)    Agent: Please be advised that RX refills may take up to 3 business days. We ask that you follow-up with your pharmacy.

## 2019-02-25 NOTE — Telephone Encounter (Signed)
Pt called to say that she has had weakness and heart palpitations since starting Amlodipine.  She states that she has stopped the medication and is requesting a substitute. She states her BP has been running 140/80's 150/80's.  She is unable to tell me her heart rate.  She states when the palpations come it is a fast beat not slow or skipping beats. She has no symptoms at this time.  Appointment scheduled per protocol.  Care advice read to patient. Pt verbalized understanding of all instructions.  Reason for Disposition . [1] Palpitations AND [2] no improvement after using CARE ADVICE  Answer Assessment - Initial Assessment Questions 1. SYMPTOMS: "Do you have any symptoms?"     Heart palpations and some weakness 2. SEVERITY: If symptoms are present, ask "Are they mild, moderate or severe?"    SOB does normal activities  Answer Assessment - Initial Assessment Questions 1. DESCRIPTION: "Please describe your heart rate or heart beat that you are having" (e.g., fast/slow, regular/irregular, skipped or extra beats, "palpitations")     fast 2. ONSET: "When did it start?" (Minutes, hours or days)      With Amlodipine 3. DURATION: "How long does it last" (e.g., seconds, minutes, hours)     Comes ad goes 4. PATTERN "Does it come and go, or has it been constant since it started?"  "Does it get worse with exertion?"   "Are you feeling it now?"     Comes and goes, normal now 5. TAP: "Using your hand, can you tap out what you are feeling on a chair or table in front of you, so that I can hear?" (Note: not all patients can do this)       Normal rate now 6. HEART RATE: "Can you tell me your heart rate?" "How many beats in 15 seconds?"  (Note: not all patients can do this)       No 7. RECURRENT SYMPTOM: "Have you ever had this before?" If so, ask: "When was the last time?" and "What happened that time?"      No 8. CAUSE: "What do you think is causing the palpitations?"     medication 9. CARDIAC HISTORY:  "Do you have any history of heart disease?" (e.g., heart attack, angina, bypass surgery, angioplasty, arrhythmia)      no 10. OTHER SYMPTOMS: "Do you have any other symptoms?" (e.g., dizziness, chest pain, sweating, difficulty breathing)      Sometimes SOB and weakness 11. PREGNANCY: "Is there any chance you are pregnant?" "When was your last menstrual period?"       N/A  Protocols used: HEART RATE AND HEARTBEAT QUESTIONS-A-AH, MEDICATION QUESTION CALL-A-AH

## 2019-02-25 NOTE — Progress Notes (Signed)
Tina Stone - 69 y.o. female MRN [id number]  Date of birth: 05-May-1950  Subjective Chief Complaint  Patient presents with  . Other    has discontinued Norvasc one week ago, she was experiencing palpatations. Denies SOB or chest pain. Symptoms have improved.     HPI Tina Stone is a 69 y.o. female here today for f/u of HTN.  Started on amlodipine at previous visit however discontinued 1 week ago due to palpitations.  Palpitations have resolved since stopping medication.  BP remains elevated today.  Admits that she doesn't get enough sleep due to work schedule.  Denies chest pain, shortness of breath, palpitations headache or vision changes.   ROS:  A comprehensive ROS was completed and negative except as noted per HPI  Allergies  Allergen Reactions  . Bactrim [Sulfamethoxazole-Trimethoprim] Other (See Comments)    Abdominal pain, dizziness  . Pravastatin Other (See Comments)    Lower abdominal pain  . Shellfish Allergy Nausea And Vomiting  . Amlodipine Palpitations    Past Medical History:  Diagnosis Date  . Adenomatous colon polyp   . Allergic rhinitis, seasonal   . Allergy   . Beta thalassemia trait 11/25/2013  . Cholelithiasis   . Class 3 obesity without serious comorbidity with body mass index (BMI) of 40.0 to 44.9 in adult 11/19/2012  . GERD (gastroesophageal reflux disease)   . HLD (hyperlipidemia)   . Hypercholesterolemia   . Hypertension 03/18/2017   no meds   . Intraductal papilloma of left breast    Patient underwent left needle-localized lumpectomy by Dr. Imogene Burn. Tsuei on 09/09/2013; pathology showed intraductal papilloma with no atypia or malignancy identified.  Marland Kitchen PONV (postoperative nausea and vomiting)   . Prediabetes 03/18/2017   History of elevated glucose with A1c of 5.8% in 2013. This is consistent with obesity and possible metabolic syndrome. Plan to check hemoglobin A1c today and annually.  Marland Kitchen Uterine fibroid     Past Surgical History:    Procedure Laterality Date  . [place]   right  . BREAST EXCISIONAL BIOPSY Left 2014   benign  . BREAST LUMPECTOMY WITH NEEDLE LOCALIZATION Left 09/09/2013   Procedure: BREAST LUMPECTOMY WITH NEEDLE LOCALIZATION;  Surgeon: Imogene Burn. Georgette Dover, MD;  Location: [place];  Service: General;  Laterality: Left;  . COLONOSCOPY    . POLYPECTOMY      Social History   Socioeconomic History  . Marital status: Widowed    Spouse name: Not on file  . Number of children: Not on file  . Years of education: Not on file  . Highest education level: Not on file  Occupational History  . Not on file  Social Needs  . Financial resource strain: Not on file  . Food insecurity:    Worry: Not on file    Inability: Not on file  . Transportation needs:    Medical: Not on file    Non-medical: Not on file  Tobacco Use  . Smoking status: Former Research scientist (life sciences)  . Smokeless tobacco: Never Used  . Tobacco comment: few puffs but not a true smoker   Substance and Sexual Activity  . Alcohol use: Never    Frequency: Never  . Drug use: Never  . Sexual activity: Not on file  Lifestyle  . Physical activity:    Days per week: Not on file    Minutes per session: Not on file  . Stress: Not on file  Relationships  . Social connections:  Talks on phone: Not on file    Gets together: Not on file    Attends religious service: Not on file    Active member of club or organization: Not on file    Attends meetings of clubs or organizations: Not on file    Relationship status: Not on file  Other Topics Concern  . 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 Diabetes Mother   . Hypertension Mother   . Colon polyps Mother 46  . Dementia Mother 42  . Diabetes Father   . Congestive Heart Failure Father   . Pancreatic cancer  Paternal Aunt   . Colon cancer Neg Hx   . Breast cancer Neg Hx   . Lung cancer Neg Hx   . Esophageal cancer Neg Hx   . Rectal cancer Neg Hx   . Stomach cancer Neg Hx     Health Maintenance  Topic Date Due  . PNA vac Low Risk Adult (2 of 2 - PPSV23) 05/13/2019  . MAMMOGRAM  07/28/2020  . TETANUS/TDAP  08/05/2023  . COLONOSCOPY  10/07/2023  . INFLUENZA VACCINE  Completed  . DEXA SCAN  Completed  . Hepatitis C Screening  Completed    ----------------------------------------------------------------------------------------------------------------------------------------------------------------------------------------------------------------- Physical Exam BP (!) 152/88   Pulse 75   Temp 98 F (36.7 C) (Oral)   Ht 5\' 4"  (1.626 m)   Wt 226 lb (102.5 kg)   SpO2 98%   BMI 38.79 kg/m   Physical Exam Constitutional:      Appearance: Normal appearance.  HENT:     Head: Normocephalic and atraumatic.     Mouth/Throat:     Mouth: Mucous membranes are moist.  Eyes:     General: No scleral icterus. Cardiovascular:     Rate and Rhythm: Normal rate and regular rhythm.     Heart sounds: Normal heart sounds.  Pulmonary:     Effort: Pulmonary effort is normal.     Breath sounds: Normal breath sounds.  Skin:    General: Skin is warm and dry.     Findings: No rash.  Neurological:     General: No focal deficit present.     Mental Status: She is alert.  Psychiatric:        Mood and Affect: Mood normal.        Behavior: Behavior normal.     ------------------------------------------------------------------------------------------------------------------------------------------------------------------------------------------------------------------- Assessment and Plan  Hypertension -Uncontrolled at this time.  -Amlodipine d/c'ed and added to allergy/intolerance list.  -Start olmesartan 20mg  -f/u in 4-6 weeks.

## 2019-02-25 NOTE — Patient Instructions (Signed)

## 2019-03-02 ENCOUNTER — Telehealth: Payer: Self-pay

## 2019-03-02 NOTE — Telephone Encounter (Signed)
Copied from [place] [id number]. Topic: General - Other >> Mar 02, 2019  9:25 AM Ivar Drape wrote: Reason for CRM:  Patient would like a return call from [place] concerning her olmesartan (BENICAR) 20 MG tablet medication.

## 2019-03-02 NOTE — Telephone Encounter (Signed)
Left vm to call back

## 2019-03-02 NOTE — Telephone Encounter (Signed)
Please have her try 1/2 tab and see if this improves her symptoms.

## 2019-03-02 NOTE — Telephone Encounter (Signed)
Spoke with pt and informed her of the message, per Dr. Zigmund Daniel, pt understood and had no additional questions at that time.

## 2019-03-02 NOTE — Telephone Encounter (Signed)
Dr. Zigmund Daniel please advise,  Pt has been taking her benicar for the past 2 days and per pt she has been experiencing headaches, felt like bp was low, but was not able to check it, and also was concerned about low pulse rate. Pt feels dose may be too high for.   [facility]

## 2019-03-04 ENCOUNTER — Ambulatory Visit: Payer: Medicare Other | Admitting: Family Medicine

## 2019-03-05 ENCOUNTER — Encounter (HOSPITAL_COMMUNITY): Payer: Self-pay | Admitting: Emergency Medicine

## 2019-03-05 ENCOUNTER — Emergency Department (HOSPITAL_COMMUNITY)
Admission: EM | Admit: 2019-03-05 | Discharge: 2019-03-05 | Disposition: A | Discharge status: Home / Self Care | Payer: Medicare Other | Attending: Emergency Medicine | Admitting: Emergency Medicine

## 2019-03-05 ENCOUNTER — Other Ambulatory Visit: Payer: Self-pay

## 2019-03-05 DIAGNOSIS — I10 Essential (primary) hypertension: Secondary | ICD-10-CM | POA: Diagnosis not present

## 2019-03-05 DIAGNOSIS — Z79899 Other long term (current) drug therapy: Secondary | ICD-10-CM | POA: Insufficient documentation

## 2019-03-05 DIAGNOSIS — T7840XA Allergy, unspecified, initial encounter: Secondary | ICD-10-CM | POA: Insufficient documentation

## 2019-03-05 MED ORDER — EPINEPHRINE 0.3 MG/0.3ML IJ SOAJ: 0.3000 mg | INTRAMUSCULAR | 1 refills | Status: DC | PRN

## 2019-03-05 NOTE — ED Provider Notes (Signed)
[facility] Emergency Department Provider Note MRN:  [id number]  Arrival date & time: 03/05/19     Chief Complaint   Allergic Reaction   History of Present Illness   Tina Stone is a 69 y.o. year-old female with a history of hypertension, allergic reaction presenting to the ED with chief complaint of allergic reaction.   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  Denies vomiting or diarrhea, no chest pain, no abdominal pain.  Came straight to the emergency department for evaluation.  Symptoms have since resolved and she feels back to normal.  Patient explains that she has had a similar event occur in the past and was told that she has an allergy to cayenne pepper.  Explained that she was preparing food with a Mediterranean seasoning blend that she has not tried before.  Review of Systems  A complete 10 system review of systems was obtained and all systems are negative except as noted in the HPI and PMH.   Patient's Health History    Past Medical History:  Diagnosis Date  . Adenomatous colon polyp   . Allergic rhinitis, seasonal   . Allergy   . Beta thalassemia trait 11/25/2013  . Cholelithiasis   . Class 3 obesity without serious comorbidity with body mass index (BMI) of 40.0 to 44.9 in adult 11/19/2012  . GERD (gastroesophageal reflux disease)   . HLD (hyperlipidemia)   . Hypercholesterolemia   . Hypertension 03/18/2017   no meds   . Intraductal papilloma of left breast    Patient underwent left needle-localized lumpectomy by Dr. Imogene Burn. Tsuei on 09/09/2013; pathology showed intraductal papilloma with no atypia or malignancy identified.  Marland Kitchen PONV (postoperative nausea and vomiting)   . Prediabetes 03/18/2017   History of elevated glucose with A1c  of 5.8% in 2013. This is consistent with obesity and possible metabolic syndrome. Plan to check hemoglobin A1c today and annually.  Marland Kitchen Uterine fibroid     Past Surgical History:  Procedure Laterality Date  . [place]   right  . BREAST EXCISIONAL BIOPSY Left 2014   benign  . BREAST LUMPECTOMY WITH NEEDLE LOCALIZATION Left 09/09/2013   Procedure: BREAST LUMPECTOMY WITH NEEDLE LOCALIZATION;  Surgeon: Imogene Burn. Georgette Dover, MD;  Location: [place];  Service: General;  Laterality: Left;  . COLONOSCOPY    . POLYPECTOMY      Family History  Problem Relation Age of Onset  . Diabetes Mother   . Hypertension Mother   . Colon polyps Mother 44  . Dementia Mother 70  . Diabetes Father   . Congestive Heart Failure Father   . Pancreatic cancer Paternal Aunt   . Colon cancer Neg Hx   . Breast cancer Neg Hx   . Lung cancer Neg Hx   . Esophageal cancer Neg Hx   . Rectal cancer Neg Hx   . Stomach cancer Neg Hx     Social History   Socioeconomic History  . Marital status: Widowed    Spouse name: Not on file  . Number of children: Not on file  . Years of education: Not on file  . Highest education level: Not on file  Occupational History  . Not on file  Social Needs  . Financial resource strain: Not on  file  . Food insecurity:    Worry: Not on file    Inability: Not on file  . Transportation needs:    Medical: Not on file    Non-medical: Not on file  Tobacco Use  . Smoking status: Former Research scientist (life sciences)  . Smokeless tobacco: Never Used  . Tobacco comment: few puffs but not a true smoker   Substance and Sexual Activity  . Alcohol use: Never    Frequency: Never  . Drug use: Never  . Sexual activity: Not on file  Lifestyle  . Physical activity:    Days per week: Not on file    Minutes per session: Not on file  . Stress: Not on file  Relationships  . Social connections:    Talks on phone: Not on file    Gets together: Not on file    Attends religious service: Not on file     Active member of club or organization: Not on file    Attends meetings of clubs or organizations: Not on file    Relationship status: Not on file  . Intimate partner violence:    Fear of current or ex partner: Not on file    Emotionally abused: Not on file    Physically abused: Not on file    Forced sexual activity: Not on file  Other Topics Concern  . 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 Vitals:   03/05/19 2130 03/05/19 2145  BP: (!) 140/92 (!) 148/95  Pulse: 75 75  Resp: 14 13  Temp:    SpO2: 100% 99%    CONSTITUTIONAL: Well-appearing, NAD NEURO:  Alert and oriented x 3, no focal deficits EYES:  eyes equal and reactive ENT/NECK:  no LAD, no JVD CARDIO: Regular rate, well-perfused, normal S1 and S2 PULM:  CTAB no wheezing or rhonchi GI/GU:  normal bowel sounds, non-distended, non-tender MSK/SPINE:  No gross deformities, no edema SKIN:  no rash, atraumatic PSYCH: Mildly anxious speech and behavior  Diagnostic and Interventional Summary    EKG Interpretation  Date/Time:  Thursday March 05 2019 20:00:31 EST Ventricular Rate:  127 PR Interval:  160 QRS Duration: 80 QT Interval:  286 QTC Calculation: 415 R Axis:   69 Text Interpretation:  Sinus tachycardia Right atrial enlargement Borderline ECG Confirmed by Gerlene Fee [phone]) on 03/05/2019 8:38:49 PM      Labs Reviewed - No data to display  No orders to display    Medications - No data to display   Procedures Critical Care  ED Course and Medical Decision Making  I have reviewed the triage vital signs and the nursing notes.  Pertinent labs & imaging results that were available during my care of the patient were reviewed by me and considered in my medical decision making (see below for details).  Suspect allergic reaction,  most likely repeat exposure to cayenne pepper in this 70 year old female with history of the same.  No objective evidence of allergy at this time, mildly tachycardic on arrival, seems to be a large component of anxiety.  Will observe her for period of time and likely discharge with reassurance and EpiPen.  No hypoxia, no evidence of DVT, nothing to suggest PE.  EKG is without concerns.  Patient observed in the emergency department with cardiac monitoring for 2+ hours with no objective signs of allergic reaction, complete normalization of vital signs.  Appropriate for discharge, patient advised to go straight to the pharmacy to pick up your EpiPen, to be used for emergencies such as severe shortness of breath with facial or tongue swelling in the setting of possible recurrent allergic reaction.  After the discussed management above, the patient was determined to be safe for discharge.  The patient was in agreement with this plan and all questions regarding their care were answered.  ED return precautions were discussed and the patient will return to the ED with any significant worsening of condition.  Barth Kirks. Sedonia Small, [place] [email or web address]  Final Clinical Impressions(s) / ED Diagnoses     ICD-10-CM   1. Allergic reaction, initial encounter T78.40XA     ED Discharge Orders         Ordered    EPINEPHrine (EPIPEN 2-PAK) 0.3 mg/0.3 mL IJ SOAJ injection  As needed     03/05/19 2207             Maudie Flakes, MD 03/05/19 2211

## 2019-03-05 NOTE — ED Triage Notes (Signed)
Patient stating she took her BP medication this morning at 7am and she started having SOB, palpitations, tingling on her face and felt her "tongue was thick" about 40 min ago. She was started on this medication about 3-4 days ago. Patient hypertensive in triage, no trouble breathing or airway obstruction. NAD.

## 2019-03-05 NOTE — Discharge Instructions (Addendum)
You were evaluated in the Emergency Department and after careful evaluation, we did not find any emergent condition requiring admission or further testing in the hospital.  Your symptoms today seem to be due to an allergic reaction.  Please go to the pharmacy tonight and pick up your prescription for the EpiPen and read the instructions on administration.  Please return to the Emergency Department if you experience any worsening of your condition.  We encourage you to follow up with a primary care provider.  Thank you for allowing [place] to be a part of your care.

## 2019-03-23 ENCOUNTER — Other Ambulatory Visit: Payer: Medicare Other

## 2019-03-25 ENCOUNTER — Ambulatory Visit: Payer: Medicare Other | Admitting: Family Medicine

## 2019-04-08 ENCOUNTER — Ambulatory Visit: Payer: Medicare Other | Admitting: Family Medicine

## 2019-04-11 ENCOUNTER — Ambulatory Visit: Payer: Medicare Other | Admitting: Family Medicine

## 2019-04-11 ENCOUNTER — Encounter (HOSPITAL_COMMUNITY): Payer: Self-pay | Admitting: Emergency Medicine

## 2019-04-11 ENCOUNTER — Telehealth: Payer: Self-pay

## 2019-04-11 ENCOUNTER — Emergency Department (HOSPITAL_COMMUNITY)
Admission: EM | Admit: 2019-04-11 | Discharge: 2019-04-11 | Disposition: A | Discharge status: Home / Self Care | Payer: Medicare Other | Attending: Emergency Medicine | Admitting: Emergency Medicine

## 2019-04-11 ENCOUNTER — Other Ambulatory Visit: Payer: Self-pay

## 2019-04-11 DIAGNOSIS — Z79899 Other long term (current) drug therapy: Secondary | ICD-10-CM | POA: Insufficient documentation

## 2019-04-11 DIAGNOSIS — I10 Essential (primary) hypertension: Secondary | ICD-10-CM | POA: Diagnosis not present

## 2019-04-11 DIAGNOSIS — Z20828 Contact with and (suspected) exposure to other viral communicable diseases: Secondary | ICD-10-CM | POA: Insufficient documentation

## 2019-04-11 DIAGNOSIS — Z87891 Personal history of nicotine dependence: Secondary | ICD-10-CM | POA: Diagnosis not present

## 2019-04-11 DIAGNOSIS — R7303 Prediabetes: Secondary | ICD-10-CM | POA: Insufficient documentation

## 2019-04-11 DIAGNOSIS — R05 Cough: Secondary | ICD-10-CM | POA: Insufficient documentation

## 2019-04-11 DIAGNOSIS — R059 Cough, unspecified: Secondary | ICD-10-CM

## 2019-04-11 DIAGNOSIS — R0602 Shortness of breath: Secondary | ICD-10-CM | POA: Diagnosis present

## 2019-04-11 MED ORDER — BENZONATATE 100 MG PO CAPS: 100.0000 mg | ORAL_CAPSULE | Freq: Three times a day (TID) | ORAL | 0 refills | Status: DC

## 2019-04-11 NOTE — Discharge Instructions (Addendum)
We have limited coronavirus testing at this time. We are instructing patient's with cough to quarantine themselves for 14 days. You may be able to discontinue self quarantine if the following conditions are met:   Persons with COVID-19 who have symptoms and were directed to care for themselves at home may discontinue home isolation under the  following conditions: - It has been at least 7 days have passed since symptoms first appeared. - AND at least 3 days (72 hours) have passed since recovery defined as resolution of fever without the use of fever-reducing medications and improvement in respiratory symptoms (e.g., cough, shortness of breath)  Please follow the below quarantine instructions.   Please follow up with primary care within 3-5 days for re-evaluation- call prior to going to the office to make them aware of your symptoms. Return to the ER for new or worsening symptoms including but not limited to increased work of breathing, fever, chest pain, passing out, or any other concerns.       Person Under Monitoring Name: Tina Stone  Location: [address] [place] [address]   Infection Prevention Recommendations for Individuals Confirmed to have, or Being Evaluated for, 2019 Novel Coronavirus (COVID-19) Infection Who Receive Care at Home  Individuals who are confirmed to have, or are being evaluated for, COVID-19 should follow the prevention steps below until a healthcare provider or local or state health department says they can return to normal activities.  Stay home except to get medical care You should restrict activities outside your home, except for getting medical care. Do not go to work, school, or public areas, and do not use public transportation or taxis.  Call ahead before visiting your doctor Before your medical appointment, call the healthcare provider and tell them that you have, or are being evaluated for, COVID-19 infection. This will help  the healthcare providers office take steps to keep other people from getting infected. Ask your healthcare provider to call the local or state health department.  Monitor your symptoms Seek prompt medical attention if your illness is worsening (e.g., difficulty breathing). Before going to your medical appointment, call the healthcare provider and tell them that you have, or are being evaluated for, COVID-19 infection. Ask your healthcare provider to call the local or state health department.  Wear a facemask You should wear a facemask that covers your nose and mouth when you are in the same room with other people and when you visit a healthcare provider. People who live with or visit you should also wear a facemask while they are in the same room with you.  Separate yourself from other people in your home As much as possible, you should stay in a different room from other people in your home. Also, you should use a separate bathroom, if available.  Avoid sharing household items You should not share dishes, drinking glasses, cups, eating utensils, towels, bedding, or other items with other people in your home. After using these items, you should wash them thoroughly with soap and water.  Cover your coughs and sneezes Cover your mouth and nose with a tissue when you cough or sneeze, or you can cough or sneeze into your sleeve. Throw used tissues in a lined trash can, and immediately wash your hands with soap and water for at least 20 seconds or use an alcohol-based hand rub.  Wash your [facility] your hands often and thoroughly with soap and water for at least 20 seconds. You  can use an alcohol-based hand sanitizer if soap and water are not available and if your hands are not visibly dirty. Avoid touching your eyes, nose, and mouth with unwashed hands.   Prevention Steps for Caregivers and Household Members of Individuals Confirmed to have, or Being Evaluated for, COVID-19 Infection  Being Cared for in the Home  If you live with, or provide care at home for, a person confirmed to have, or being evaluated for, COVID-19 infection please follow these guidelines to prevent infection:  Follow healthcare providers instructions Make sure that you understand and can help the patient follow any healthcare provider instructions for all care.  Provide for the patients basic needs You should help the patient with basic needs in the home and provide support for getting groceries, prescriptions, and other personal needs.  Monitor the patients symptoms If they are getting sicker, call his or her medical provider and tell them that the patient has, or is being evaluated for, COVID-19 infection. This will help the healthcare providers office take steps to keep other people from getting infected. Ask the healthcare provider to call the local or state health department.  Limit the number of people who have contact with the patient If possible, have only one caregiver for the patient. Other household members should stay in another home or place of residence. If this is not possible, they should stay in another room, or be separated from the patient as much as possible. Use a separate bathroom, if available. Restrict visitors who do not have an essential need to be in the home.  Keep older adults, very young children, and other sick people away from the patient Keep older adults, very young children, and those who have compromised immune systems or chronic health conditions away from the patient. This includes people with chronic heart, lung, or kidney conditions, diabetes, and cancer.  Ensure good ventilation Make sure that shared spaces in the home have good air flow, such as from an air conditioner or an opened window, weather permitting.  Wash your hands often Wash your hands often and thoroughly with soap and water for at least 20 seconds. You can use an alcohol based hand  sanitizer if soap and water are not available and if your hands are not visibly dirty. Avoid touching your eyes, nose, and mouth with unwashed hands. Use disposable paper towels to dry your hands. If not available, use dedicated cloth towels and replace them when they become wet.  Wear a facemask and gloves Wear a disposable facemask at all times in the room and gloves when you touch or have contact with the patients blood, body fluids, and/or secretions or excretions, such as sweat, saliva, sputum, nasal mucus, vomit, urine, or feces.  Ensure the mask fits over your nose and mouth tightly, and do not touch it during use. Throw out disposable facemasks and gloves after using them. Do not reuse. Wash your hands immediately after removing your facemask and gloves. If your personal clothing becomes contaminated, carefully remove clothing and launder. Wash your hands after handling contaminated clothing. Place all used disposable facemasks, gloves, and other waste in a lined container before disposing them with other household waste. Remove gloves and wash your hands immediately after handling these items.  Do not share dishes, glasses, or other household items with the patient Avoid sharing household items. You should not share dishes, drinking glasses, cups, eating utensils, towels, bedding, or other items with a patient who is confirmed to have, or being  evaluated for, COVID-19 infection. After the person uses these items, you should wash them thoroughly with soap and water.  Wash laundry thoroughly Immediately remove and wash clothes or bedding that have blood, body fluids, and/or secretions or excretions, such as sweat, saliva, sputum, nasal mucus, vomit, urine, or feces, on them. Wear gloves when handling laundry from the patient. Read and follow directions on labels of laundry or clothing items and detergent. In general, wash and dry with the warmest temperatures recommended on the  label.  Clean all areas the individual has used often Clean all touchable surfaces, such as counters, tabletops, doorknobs, bathroom fixtures, toilets, phones, keyboards, tablets, and bedside tables, every day. Also, clean any surfaces that may have blood, body fluids, and/or secretions or excretions on them. Wear gloves when cleaning surfaces the patient has come in contact with. Use a diluted bleach solution (e.g., dilute bleach with 1 part bleach and 10 parts water) or a household disinfectant with a label that says EPA-registered for coronaviruses. To make a bleach solution at home, add 1 tablespoon of bleach to 1 quart (4 cups) of water. For a larger supply, add  cup of bleach to 1 gallon (16 cups) of water. Read labels of cleaning products and follow recommendations provided on product labels. Labels contain instructions for safe and effective use of the cleaning product including precautions you should take when applying the product, such as wearing gloves or eye protection and making sure you have good ventilation during use of the product. Remove gloves and wash hands immediately after cleaning.  Monitor yourself for signs and symptoms of illness Caregivers and household members are considered close contacts, should monitor their health, and will be asked to limit movement outside of the home to the extent possible. Follow the monitoring steps for close contacts listed on the symptom monitoring form.   ? If you have additional questions, contact your local health department or call the epidemiologist on call at [phone] (available 24/7). ? This guidance is subject to change. For the most up-to-date guidance from [facility], please refer to their website: [email or web address]

## 2019-04-11 NOTE — ED Triage Notes (Signed)
Pt reports her mother is in Hospice dying and her brother from [place] came to visit and was tested positive this week for Covid 19. Pt reports she had cough and SOB for about week.

## 2019-04-11 NOTE — ED Notes (Signed)
Bed: WA12 Expected date:  Expected time:  Means of arrival:  Comments: Neg pressure 

## 2019-04-11 NOTE — ED Provider Notes (Signed)
[facility] DEPT Provider Note   CSN: [id number] Arrival date & time: 04/11/19  3818    History   Chief Complaint Chief Complaint  Patient presents with  . Shortness of Breath  . Cough  . exposure to Covid 19    HPI Tina Stone is a 69 y.o. female.     69 year old female presents with concern for possible CO VID exposure.  Patient had a relative who tested positive.  For several days she has had mild nonproductive cough without fever or chills or myalgias.  She is not had any loss of taste.  She has had some dyspnea but not severe.  Mild headache.  No vomiting or diarrhea.  No urinary symptoms.  No rashes.  No over-the-counter medications used for this not makes her symptoms better or worse.     Past Medical History:  Diagnosis Date  . Adenomatous colon polyp   . Allergic rhinitis, seasonal   . Allergy   . Beta thalassemia trait 11/25/2013  . Cholelithiasis   . Class 3 obesity without serious comorbidity with body mass index (BMI) of 40.0 to 44.9 in adult 11/19/2012  . GERD (gastroesophageal reflux disease)   . HLD (hyperlipidemia)   . Hypercholesterolemia   . Hypertension 03/18/2017   no meds   . Intraductal papilloma of left breast    Patient underwent left needle-localized lumpectomy by Dr. Imogene Burn. [place]ue[place] on 09/09/2013; pathology showed intraductal papilloma with no atypia or malignancy identified.  Marland Kitchen PONV (postoperative nausea and vomiting)   . Prediabetes 03/18/2017   History of elevated glucose with A1c of 5.8% in 2013. This is consistent with obesity and possible metabolic syndrome. Plan to check hemoglobin A1c today and annually.  Marland Kitchen Uterine fibroid     Patient Active Problem List   Diagnosis Date Noted  . Elevated glucose 02/13/2019  . Epigastric pain 01/29/2019  . Prediabetes 03/18/2017  . Hypertension 03/18/2017  . Beta thalassemia trait 11/25/2013  . Obesity 11/19/2012  . Preventative health care 07/12/2011  .  Gastroesophageal reflux disease 01/28/2007  . Hyperlipidemia 10/10/2006    Past Surgical History:  Procedure Laterality Date  . [place]   right  . BREAST EXCISIONAL BIOPSY Left 2014   benign  . BREAST LUMPECTOMY WITH NEEDLE LOCALIZATION Left 09/09/2013   Procedure: BREAST LUMPECTOMY WITH NEEDLE LOCALIZATION;  Surgeon: Imogene Burn. Georgette Dover, MD;  Location: [facility];  Service: General;  Laterality: Left;  . COLONOSCOPY    . POLYPECTOMY       OB History   No obstetric history on file.      Home Medications    Prior to Admission medications   Medication Sig Start Date End Date Taking? Authorizing Provider  ALOE VERA PO Take by mouth. 2 tbsp prn    [provider]  APPLE CIDER VINEGAR PO Take by mouth. 1 tsp daily    [provider]  EPINEPHrine (EPIPEN 2-PAK) 0.3 mg/0.3 mL IJ SOAJ injection Inject 0.3 mLs (0.3 mg total) into the muscle as needed for anaphylaxis. 03/05/19   Maudie Flakes, MD  olmesartan (BENICAR) 20 MG tablet Take 1 tablet (20 mg total) by mouth daily. 02/25/19   Luetta Nutting, DO    Family History Family History  Problem Relation Age of Onset  . Diabetes Mother   . Hypertension Mother   . Colon polyps Mother 94  . Dementia Mother 99  . Diabetes Father   . Congestive Heart Failure Father   .  Pancreatic cancer Paternal Aunt   . Colon cancer Neg Hx   . Breast cancer Neg Hx   . Lung cancer Neg Hx   . Esophageal cancer Neg Hx   . Rectal cancer Neg Hx   . Stomach cancer Neg Hx     Social History Social History   Tobacco Use  . Smoking status: Former Research scientist (life sciences)  . Smokeless tobacco: Never Used  . Tobacco comment: few puffs but not a true smoker   Substance Use Topics  . Alcohol use: Never    Frequency: Never  . Drug use: Never     Allergies   Benicar hct [olmesartan medoxomil-hctz]; Bactrim [sulfamethoxazole-trimethoprim]; Pravastatin; Shellfish allergy; and Amlodipine   Review of Systems Review of Systems  All  other systems reviewed and are negative.    Physical Exam Updated Vital Signs BP (!) 144/91 (BP Location: Left Arm)   Pulse (!) 102   Temp 98.1 F (36.7 C) (Oral)   Resp 19   SpO2 98%   Physical Exam Vitals signs and nursing note reviewed.  Constitutional:      General: She is not in acute distress.    Appearance: Normal appearance. She is well-developed. She is not toxic-appearing.  HENT:     Head: Normocephalic and atraumatic.  Eyes:     General: Lids are normal.     Conjunctiva/sclera: Conjunctivae normal.     Pupils: Pupils are equal, round, and reactive to light.  Neck:     Musculoskeletal: Normal range of motion and neck supple.     Thyroid: No thyroid mass.     Trachea: No tracheal deviation.  Cardiovascular:     Rate and Rhythm: Normal rate and regular rhythm.     Heart sounds: Normal heart sounds. No murmur. No gallop.   Pulmonary:     Effort: Pulmonary effort is normal. No respiratory distress.     Breath sounds: Normal breath sounds. No stridor or decreased air movement. No decreased breath sounds, wheezing, rhonchi or rales.  Abdominal:     General: Bowel sounds are normal. There is no distension.     Palpations: Abdomen is soft.     Tenderness: There is no abdominal tenderness. There is no rebound.  Musculoskeletal: Normal range of motion.        General: No tenderness.  Skin:    General: Skin is warm and dry.     Findings: No abrasion or rash.  Neurological:     Mental Status: She is alert and oriented to person, place, and time.     GCS: GCS eye subscore is 4. GCS verbal subscore is 5. GCS motor subscore is 6.     Cranial Nerves: No cranial nerve deficit.     Sensory: No sensory deficit.  Psychiatric:        Speech: Speech normal.        Behavior: Behavior normal.      ED Treatments / Results  Labs (all labs ordered are listed, but only abnormal results are displayed) Labs Reviewed - No data to display  EKG None  Radiology No results  found.  Procedures Procedures (including critical care time)  Medications Ordered in ED Medications - No data to display   Initial Impression / Assessment and Plan / ED Course  I have reviewed the triage vital signs and the nursing notes.  Pertinent labs & imaging results that were available during my care of the patient were reviewed by me and considered in my medical decision making (  see chart for details).        Patient's pulse oximetry is stable here.  She is resting comfortably at this time.  Her lungs are clear.  No indication for chest x-ray at this time and she has not had a productive cough or fever.  Patient will be discharged with quarantine instructions, given something for cough, and strict return precautions  Final Clinical Impressions(s) / ED Diagnoses   Final diagnoses:  None    ED Discharge Orders    None       Lacretia Leigh, MD 04/11/19 1035

## 2019-04-11 NOTE — Telephone Encounter (Signed)
Pt had appt on 04/11/2019 for [facility], Pt went to ER instead. Called Pt to check on her. She needs 14 day quarantine note for work. Dx COVID19.

## 2019-04-13 ENCOUNTER — Other Ambulatory Visit: Payer: Self-pay | Admitting: Family Medicine

## 2019-04-13 ENCOUNTER — Telehealth: Payer: Self-pay

## 2019-04-13 NOTE — Telephone Encounter (Signed)
[employer] note received via fax  Call Date: 04/13/2019 Time: 9:03 AM   Caller: Tina Stone Return number: [phone]  Reason for call: Request for General Office Information  Initial Comment: Caller wanted to extend the quarantine for a week and a half  Additional Comment: Please reach out to her for a doctor's note to extend her time off from work. She still has symptoms.

## 2019-04-13 NOTE — Telephone Encounter (Signed)
Informed Dr. Zigmund Daniel. Letter sent today.

## 2019-04-13 NOTE — Telephone Encounter (Signed)
Pt requested to change appt for next week , due to her not feeling well. Pt stated she is "Having a hard time talking" .

## 2019-04-14 NOTE — Telephone Encounter (Signed)
Patient called and request that letter from yesterday 04/13/2019 be sent via her My Chart please.

## 2019-04-15 ENCOUNTER — Ambulatory Visit: Payer: Medicare Other | Admitting: Family Medicine

## 2019-04-15 NOTE — Telephone Encounter (Signed)
[employer]. Did [place] MAIL the original that Dr. Zigmund Daniel completed.

## 2019-04-22 ENCOUNTER — Ambulatory Visit: Payer: Medicare Other | Admitting: Family Medicine

## 2019-05-04 ENCOUNTER — Ambulatory Visit: Payer: Self-pay | Admitting: *Deleted

## 2019-05-04 NOTE — Telephone Encounter (Signed)
Message from Rutherford Nail, [place] sent at 05/04/2019 4:57 PM EDT   Summary: Headache on and off x 3 days   Patient calling and states that she has had a headache on and off for 3 days. Would like to know what she could take or do to alleviate the pain? Please advise.           Returned call to patient regarding having a headache off and on for last  3 days, She has a hx of HTN but has not checked her b/p. She also has missed taking some of her b/p medications, she stated because of some of the side effects she was having. She also thinks it may be from her sinuses. Because of the location of the headache and pain around her eyes. Having some nasal congestion. She denies having a fever. No hx of migraines. She remembers her last b/p of 140/?. Advised to go have her b/p checked and talk with a pharmacist what they recommend for sinus problems. Advised if her b/p is elevated and with these symptoms, she need to be seen more urgently at an ED. Pt voiced understanding. Routing to LB at [facility] for review and call back in the morning.    Reason for Disposition . [1] MILD-MODERATE headache AND [2] present > 72 hours  Answer Assessment - Initial Assessment Questions 1. LOCATION: "Where does it hurt?"      Frontal part of her head 2. ONSET: "When did the headache start?" (Minutes, hours or days)      Around Saturday 3. PATTERN: "Does the pain come and go, or has it been constant since it started?"     Come and goes 4. SEVERITY: "How bad is the pain?" and "What does it keep you from doing?"  (e.g., Scale 1-10; mild, moderate, or severe)   - MILD (1-3): doesn't interfere with normal activities    - MODERATE (4-7): interferes with normal activities or awakens from sleep    - SEVERE (8-10): excruciating pain, unable to do any normal activities        Mild but annoying 5. RECURRENT SYMPTOM: "Have you ever had headaches before?" If so, ask: "When was the last time?" and "What happened that  time?"      no 6. CAUSE: "What do you think is causing the headache?"     Maybe sinus or b/p problems 7. MIGRAINE: "Have you been diagnosed with migraine headaches?" If so, ask: "Is this headache similar?"      no 8. HEAD INJURY: "Has there been any recent injury to the head?"      no 9. OTHER SYMPTOMS: "Do you have any other symptoms?" (fever, stiff neck, eye pain, sore throat, cold symptoms)     Stiff neck at times maybe from sleeping on several pillows, pain around her eyes 10. PREGNANCY: "Is there any chance you are pregnant?" "When was your last menstrual period?"       n/a  Protocols used: HEADACHE-A-AH

## 2019-05-05 ENCOUNTER — Emergency Department (HOSPITAL_COMMUNITY)
Admission: EM | Admit: 2019-05-05 | Discharge: 2019-05-05 | Disposition: A | Discharge status: Home / Self Care | Payer: Medicare Other | Attending: Emergency Medicine | Admitting: Emergency Medicine

## 2019-05-05 ENCOUNTER — Other Ambulatory Visit: Payer: Self-pay

## 2019-05-05 DIAGNOSIS — Z79899 Other long term (current) drug therapy: Secondary | ICD-10-CM | POA: Diagnosis not present

## 2019-05-05 DIAGNOSIS — R7303 Prediabetes: Secondary | ICD-10-CM | POA: Diagnosis not present

## 2019-05-05 DIAGNOSIS — R42 Dizziness and giddiness: Secondary | ICD-10-CM | POA: Diagnosis not present

## 2019-05-05 DIAGNOSIS — R51 Headache: Secondary | ICD-10-CM | POA: Insufficient documentation

## 2019-05-05 DIAGNOSIS — Z87891 Personal history of nicotine dependence: Secondary | ICD-10-CM | POA: Diagnosis not present

## 2019-05-05 DIAGNOSIS — R519 Headache, unspecified: Secondary | ICD-10-CM

## 2019-05-05 DIAGNOSIS — I10 Essential (primary) hypertension: Secondary | ICD-10-CM | POA: Insufficient documentation

## 2019-05-05 NOTE — ED Provider Notes (Signed)
[facility] Provider Note   CSN: [id number] Arrival date & time: 05/05/19  1145    History   Chief Complaint Chief Complaint  Patient presents with  . Dizziness    vital signs checks    HPI Tina Stone is a 69 y.o. female.     Patient is a 69 year old female with a history of hyperlipidemia, hypertension, GERD, prediabetes presenting today requesting to have her blood pressure checked.  Patient states in the last 2 to 3 weeks she has stopped taking her Benicar because of side effects she was not comfortable with.  For the last 4 days she has had a headache in the morning when she wakes up which usually resolves spontaneously and then comes back in the evenings.  He is also occasionally felt lightheaded but none of these symptoms last very long.  She denies any vision changes, unilateral weakness or numbness.  She is having no shortness of breath or chest pain at this time.  She states that she is scheduled to see her doctor tomorrow but when she went around to the drugstores trying to use a monitor to check her blood pressure there were none available.  She has had no abdominal pain, nausea or vomiting.  She recently lost her mother who had been on hospice care so she has been more stressed than normal and also states that her allergies do bother her with nasal congestion but she is not currently taking anything.  The history is provided by the patient.  Dizziness  Quality:  Lightheadedness Severity:  Mild Onset quality:  Sudden Duration:  4 days Timing:  Sporadic Progression:  Resolved Chronicity:  Recurrent Relieved by:  Being still and lying down Worsened by:  Standing up Ineffective treatments:  None tried Associated symptoms: headaches   Associated symptoms: no chest pain, no shortness of breath, no vision changes, no vomiting and no weakness   Risk factors comment:  Hx of htn   Past Medical History:  Diagnosis Date  .  Adenomatous colon polyp   . Allergic rhinitis, seasonal   . Allergy   . Beta thalassemia trait 11/25/2013  . Cholelithiasis   . Class 3 obesity without serious comorbidity with body mass index (BMI) of 40.0 to 44.9 in adult 11/19/2012  . GERD (gastroesophageal reflux disease)   . HLD (hyperlipidemia)   . Hypercholesterolemia   . Hypertension 03/18/2017   no meds   . Intraductal papilloma of left breast    Patient underwent left needle-localized lumpectomy by Dr. Imogene Burn. Tsuei on 09/09/2013; pathology showed intraductal papilloma with no atypia or malignancy identified.  Marland Kitchen PONV (postoperative nausea and vomiting)   . Prediabetes 03/18/2017   History of elevated glucose with A1c of 5.8% in 2013. This is consistent with obesity and possible metabolic syndrome. Plan to check hemoglobin A1c today and annually.  Marland Kitchen Uterine fibroid     Patient Active Problem List   Diagnosis Date Noted  . Elevated glucose 02/13/2019  . Epigastric pain 01/29/2019  . Prediabetes 03/18/2017  . Hypertension 03/18/2017  . Beta thalassemia trait 11/25/2013  . Obesity 11/19/2012  . Preventative health care 07/12/2011  . Gastroesophageal reflux disease 01/28/2007  . Hyperlipidemia 10/10/2006    Past Surgical History:  Procedure Laterality Date  . [place]   right  . BREAST EXCISIONAL BIOPSY Left 2014   benign  . BREAST LUMPECTOMY WITH NEEDLE LOCALIZATION Left 09/09/2013   Procedure: BREAST LUMPECTOMY WITH NEEDLE LOCALIZATION;  Surgeon: Imogene Burn. Georgette Dover, MD;  Location: [facility];  Service: General;  Laterality: Left;  . COLONOSCOPY    . POLYPECTOMY       OB History   No obstetric history on file.      Home Medications    Prior to Admission medications   Medication Sig Start Date End Date Taking? Authorizing Provider  ALOE VERA PO Take by mouth. 2 tbsp prn    [provider]  APPLE CIDER VINEGAR PO Take by mouth. 1 tsp daily    [provider]  benzonatate  (TESSALON) 100 MG capsule Take 1 capsule (100 mg total) by mouth every 8 (eight) hours. 04/11/19   Lacretia Leigh, MD  EPINEPHrine (EPIPEN 2-PAK) 0.3 mg/0.3 mL IJ SOAJ injection Inject 0.3 mLs (0.3 mg total) into the muscle as needed for anaphylaxis. 03/05/19   Maudie Flakes, MD  olmesartan (BENICAR) 20 MG tablet Take 1 tablet (20 mg total) by mouth daily. 02/25/19   Luetta Nutting, DO    Family History Family History  Problem Relation Age of Onset  . Diabetes Mother   . Hypertension Mother   . Colon polyps Mother 27  . Dementia Mother 33  . Diabetes Father   . Congestive Heart Failure Father   . Pancreatic cancer Paternal Aunt   . Colon cancer Neg Hx   . Breast cancer Neg Hx   . Lung cancer Neg Hx   . Esophageal cancer Neg Hx   . Rectal cancer Neg Hx   . Stomach cancer Neg Hx     Social History Social History   Tobacco Use  . Smoking status: Former Research scientist (life sciences)  . Smokeless tobacco: Never Used  . Tobacco comment: few puffs but not a true smoker   Substance Use Topics  . Alcohol use: Never    Frequency: Never  . Drug use: Never     Allergies   Benicar hct [olmesartan medoxomil-hctz]; Bactrim [sulfamethoxazole-trimethoprim]; Pravastatin; Shellfish allergy; and Amlodipine   Review of Systems Review of Systems  Respiratory: Negative for shortness of breath.   Cardiovascular: Negative for chest pain.  Gastrointestinal: Negative for vomiting.  Neurological: Positive for dizziness and headaches. Negative for weakness.  All other systems reviewed and are negative.    Physical Exam Updated Vital Signs BP (!) 152/94 (BP Location: Right Arm)   Pulse 89   Temp 97.9 F (36.6 C) (Oral)   Resp 16   Ht 5\' 4"  (1.626 m)   Wt 99.8 kg   SpO2 98%   BMI 37.76 kg/m   Physical Exam Vitals signs and nursing note reviewed.  Constitutional:      General: She is not in acute distress.    Appearance: She is well-developed.  HENT:     Head: Normocephalic and atraumatic.     Right  Ear: There is impacted cerumen.     Left Ear: Tympanic membrane normal.  No middle ear effusion.     Nose: Mucosal edema present.  Eyes:     Pupils: Pupils are equal, round, and reactive to light.  Cardiovascular:     Rate and Rhythm: Normal rate and regular rhythm.     Heart sounds: Normal heart sounds. No murmur. No friction rub.  Pulmonary:     Effort: Pulmonary effort is normal.     Breath sounds: Normal breath sounds. No wheezing or rales.  Musculoskeletal: Normal range of motion.        General: No tenderness.     Comments: No edema  Skin:    General: Skin is warm and dry.     Findings: No rash.  Neurological:     General: No focal deficit present.     Mental Status: She is alert and oriented to person, place, and time. Mental status is at baseline.     Cranial Nerves: No cranial nerve deficit.     Gait: Gait normal.  Psychiatric:        Mood and Affect: Mood normal.        Behavior: Behavior normal.        Thought Content: Thought content normal.      ED Treatments / Results  Labs (all labs ordered are listed, but only abnormal results are displayed) Labs Reviewed - No data to display  EKG None  Radiology No results found.  Procedures Procedures (including critical care time)  Medications Ordered in ED Medications - No data to display   Initial Impression / Assessment and Plan / ED Course  I have reviewed the triage vital signs and the nursing notes.  Pertinent labs & imaging results that were available during my care of the patient were reviewed by me and considered in my medical decision making (see chart for details).       69 year old female presenting today requesting to have her blood pressure checked due to morning headaches and some mild intermittent dizziness.  She states she is not currently dizzy and is not currently having a headache at this time.  She went to multiple drugstores but was unable to find one that would check her blood pressure.   She is currently denying any chest pain or shortness of breath and has no COVID-like symptoms.  Patient has been out of her blood pressure medication for at least 2 weeks because she did not like some of the side effects.  She has a follow-up appointment with her doctor tomorrow but just wanted to make sure her blood pressure was okay.  Here her blood pressure has ranged from 133/96-164/94.  Heart rate initially with some mild tachycardia of 102 but improved with rest to 89.  She is otherwise well-appearing.  Her headaches may be related to allergies as they are present in the morning and at night and she is not currently using any medications.  Recommended she try some saline spray at least.  Also encouraged her to follow-up at her appointment tomorrow as planned.  Final Clinical Impressions(s) / ED Diagnoses   Final diagnoses:  Hypertension, unspecified type  Acute nonintractable headache, unspecified headache type    ED Discharge Orders    None       Blanchie Dessert, MD 05/05/19 1225

## 2019-05-05 NOTE — Discharge Instructions (Signed)
Follow-up with with your doctor tomorrow as planned to resume blood pressure medication.  Today her blood pressure was 133/96.  Try to eat a low-salt diet and also you can try Ocean nasal spray or saline spray at night and in the morning which may help with the headaches.

## 2019-05-05 NOTE — ED Triage Notes (Signed)
Pt here for a blood pressure check. Pt reports that she tried to go to [facility] but was unable to get her blood pressure checked there. Pt reports she did have some dizziness that started yesterday and wanted to get her blood pressure checked. Pt denies any current shortness of breath or chest pain. Pt did state that she did have a brief period of pain from indigestion about 30 minutes ago.

## 2019-05-05 NOTE — Telephone Encounter (Signed)
Ok for F2F

## 2019-05-05 NOTE — ED Notes (Signed)
Patient verbalizes understanding of discharge instructions. Opportunity for questioning and answers were provided. Armband removed by staff, pt discharged from ED.  

## 2019-05-05 NOTE — Telephone Encounter (Signed)
PT is scheduled. Task completed.

## 2019-05-06 ENCOUNTER — Encounter: Payer: Self-pay | Admitting: Family Medicine

## 2019-05-06 ENCOUNTER — Ambulatory Visit (INDEPENDENT_AMBULATORY_CARE_PROVIDER_SITE_OTHER): Payer: Medicare Other | Admitting: Family Medicine

## 2019-05-06 VITALS — BP 140/92 | HR 80 | Temp 97.7°F | Resp 18 | Wt 215.0 lb

## 2019-05-06 DIAGNOSIS — I10 Essential (primary) hypertension: Secondary | ICD-10-CM | POA: Diagnosis not present

## 2019-05-06 LAB — BASIC METABOLIC PANEL
BUN: 9 mg/dL (ref 6–23)
CO2: 26 mEq/L (ref 19–32)
Calcium: 8.7 mg/dL (ref 8.4–10.5)
Chloride: 102 mEq/L (ref 96–112)
Creatinine, Ser: 0.9 mg/dL (ref 0.40–1.20)
GFR: 75.71 mL/min (ref >60.00)
Glucose, Bld: 98 mg/dL (ref 70–99)
Potassium: 4 mEq/L (ref 3.5–5.1)
Sodium: 138 mEq/L (ref 135–145)

## 2019-05-06 MED ORDER — OLMESARTAN MEDOXOMIL 5 MG PO TABS: 5.0000 mg | ORAL_TABLET | Freq: Every day | ORAL | 1 refills | Status: DC

## 2019-05-06 MED FILL — OLMESARTAN MEDOXOMIL 5 MG T: 5 | 90 days supply | Qty: 90 | Fill #0

## 2019-05-06 NOTE — Patient Instructions (Signed)
Reduce benicar to 5mg  We'll give you a call with lab results.

## 2019-05-06 NOTE — Progress Notes (Signed)
Tina Stone - 69 y.o. female MRN [id number]  Date of birth: 05-25-1950  Subjective Chief Complaint  Patient presents with  . Headache    onset 4 days , None today, recent ER visit for BP check    HPI Tina Stone is a 69 y.o. female here today for follow up of HTN.  Seen in ED yesterday for headache and BP check.  Reports that she has stopped her benicar as she felt that her BP was getting too low.  She was having some dizziness and confusion.  BP elevated in ED yesterday and remains mildly elevated today.  Her headaches have resolved as of today.   She denies chest pain, shortness of breath, vision changes, nausea, vomiting, or palpitations.    ROS:  A comprehensive ROS was completed and negative except as noted per HPI    Allergies  Allergen Reactions  . Benicar Hct [Olmesartan Medoxomil-Hctz] Shortness Of Breath and Palpitations  . Bactrim [Sulfamethoxazole-Trimethoprim] Other (See Comments)    Abdominal pain, dizziness  . Pravastatin Other (See Comments)    Lower abdominal pain  . Shellfish Allergy Nausea And Vomiting  . Amlodipine Palpitations    Past Medical History:  Diagnosis Date  . Adenomatous colon polyp   . Allergic rhinitis, seasonal   . Allergy   . Beta thalassemia trait 11/25/2013  . Cholelithiasis   . Class 3 obesity without serious comorbidity with body mass index (BMI) of 40.0 to 44.9 in adult 11/19/2012  . GERD (gastroesophageal reflux disease)   . HLD (hyperlipidemia)   . Hypercholesterolemia   . Hypertension 03/18/2017   no meds   . Intraductal papilloma of left breast    Patient underwent left needle-localized lumpectomy by Dr. Imogene Burn. Tsuei on 09/09/2013; pathology showed intraductal papilloma with no atypia or malignancy identified.  Marland Kitchen PONV (postoperative nausea and vomiting)   . Prediabetes 03/18/2017   History of elevated glucose with A1c of 5.8% in 2013. This is consistent with obesity and possible metabolic syndrome. Plan to check  hemoglobin A1c today and annually.  Marland Kitchen Uterine fibroid     Past Surgical History:  Procedure Laterality Date  . [place]   right  . BREAST EXCISIONAL BIOPSY Left 2014   benign  . BREAST LUMPECTOMY WITH NEEDLE LOCALIZATION Left 09/09/2013   Procedure: BREAST LUMPECTOMY WITH NEEDLE LOCALIZATION;  Surgeon: Imogene Burn. Georgette Dover, MD;  Location: [facility];  Service: General;  Laterality: Left;  . COLONOSCOPY    . POLYPECTOMY      Social History   Socioeconomic History  . Marital status: Widowed    Spouse name: Not on file  . Number of children: Not on file  . Years of education: Not on file  . Highest education level: Not on file  Occupational History  . Not on file  Social Needs  . Financial resource strain: Not on file  . Food insecurity:    Worry: Not on file    Inability: Not on file  . Transportation needs:    Medical: Not on file    Non-medical: Not on file  Tobacco Use  . Smoking status: Former Research scientist (life sciences)  . Smokeless tobacco: Never Used  . Tobacco comment: few puffs but not a true smoker   Substance and Sexual Activity  . Alcohol use: Never    Frequency: Never  . Drug use: Never  . Sexual activity: Not on file  Lifestyle  . Physical activity:    Days per week: Not  on file    Minutes per session: Not on file  . Stress: Not on file  Relationships  . Social connections:    Talks on phone: Not on file    Gets together: Not on file    Attends religious service: Not on file    Active member of club or organization: Not on file    Attends meetings of clubs or organizations: Not on file    Relationship status: Not on file  Other Topics Concern  . 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 Diabetes Mother   . Hypertension Mother   . Colon polyps Mother 61  . Dementia Mother 37   . Diabetes Father   . Congestive Heart Failure Father   . Pancreatic cancer Paternal Aunt   . Colon cancer Neg Hx   . Breast cancer Neg Hx   . Lung cancer Neg Hx   . Esophageal cancer Neg Hx   . Rectal cancer Neg Hx   . Stomach cancer Neg Hx     Health Maintenance  Topic Date Due  . PNA vac Low Risk Adult (2 of 2 - PPSV23) 05/13/2019  . INFLUENZA VACCINE  08/01/2019  . MAMMOGRAM  07/28/2020  . TETANUS/TDAP  08/05/2023  . COLONOSCOPY  10/07/2023  . DEXA SCAN  Completed  . Hepatitis C Screening  Completed    ----------------------------------------------------------------------------------------------------------------------------------------------------------------------------------------------------------------- Physical Exam BP (!) 140/92   Pulse 80   Temp 97.7 F (36.5 C) (Oral)   Resp 18   Wt 215 lb (97.5 kg)   SpO2 98%   BMI 36.90 kg/m   Physical Exam Constitutional:      General: She is not in acute distress.    Appearance: She is well-developed.  HENT:     Head: Normocephalic and atraumatic.     Mouth/Throat:     Mouth: Mucous membranes are moist.  Eyes:     General: No scleral icterus. Neck:     Musculoskeletal: Neck supple.  Cardiovascular:     Rate and Rhythm: Normal rate and regular rhythm.     Heart sounds: Normal heart sounds.  Pulmonary:     Effort: Pulmonary effort is normal.     Breath sounds: Normal breath sounds.  Lymphadenopathy:     Cervical: No cervical adenopathy.  Skin:    General: Skin is warm and dry.     Findings: No rash.  Neurological:     General: No focal deficit present.     Mental Status: She is alert.  Psychiatric:        Mood and Affect: Mood normal.        Behavior: Behavior normal.     ------------------------------------------------------------------------------------------------------------------------------------------------------------------------------------------------------------------- Assessment and Plan   Hypertension -headache has resolved today -BP remains mildly elevated, will add benicar back on at 5mg .  -Update BMP -She will continue to keep an eye on BP at home.

## 2019-05-06 NOTE — Assessment & Plan Note (Addendum)
-  headache has resolved today -BP remains mildly elevated, will add benicar back on at 5mg .  -Update BMP -She will continue to keep an eye on BP at home.

## 2019-05-07 NOTE — Progress Notes (Signed)
Labs including blood sugar are normal.

## 2019-05-11 ENCOUNTER — Ambulatory Visit (INDEPENDENT_AMBULATORY_CARE_PROVIDER_SITE_OTHER): Payer: Medicare Other | Admitting: Family Medicine

## 2019-05-11 ENCOUNTER — Encounter: Payer: Self-pay | Admitting: Family Medicine

## 2019-05-11 VITALS — BP 154/92 | HR 103 | Temp 97.8°F | Wt 214.0 lb

## 2019-05-11 DIAGNOSIS — I10 Essential (primary) hypertension: Secondary | ICD-10-CM | POA: Diagnosis not present

## 2019-05-11 DIAGNOSIS — M62838 Other muscle spasm: Secondary | ICD-10-CM | POA: Diagnosis not present

## 2019-05-11 MED ORDER — TIZANIDINE HCL 4 MG PO TABS: 4.0000 mg | ORAL_TABLET | Freq: Three times a day (TID) | ORAL | 0 refills | Status: DC | PRN

## 2019-05-11 NOTE — Assessment & Plan Note (Signed)
-  BP elevated today, has been better controlled at home.  Pain likely contributing today.

## 2019-05-11 NOTE — Assessment & Plan Note (Signed)
-  Possibly related to stress of her brother recently passing away. Offered condolences. -Discussed relaxation techniques -Given handout of HEP -Rx for tizanidine as needed for spasm.  -She will let me know if not improving or worsening symptoms.

## 2019-05-11 NOTE — Patient Instructions (Signed)
Cervical Strain and Sprain Rehab Ask your health care provider which exercises are safe for you. Do exercises exactly as told by your health care provider and adjust them as directed. It is normal to feel mild stretching, pulling, tightness, or discomfort as you do these exercises, but you should stop right away if you feel sudden pain or your pain gets worse.Do not begin these exercises until told by your health care provider. Stretching and range of motion exercises These exercises warm up your muscles and joints and improve the movement and flexibility of your neck. These exercises also help to relieve pain, numbness, and tingling. Exercise A: Cervical side bend  1. Using good posture, sit on a stable chair or stand up. 2. Without moving your shoulders, slowly tilt your left / right ear to your shoulder until you feel a stretch in your neck muscles. You should be looking straight ahead. 3. Hold for __________ seconds. 4. Repeat with the other side of your neck. Repeat __________ times. Complete this exercise __________ times a day. Exercise B: Cervical rotation  1. Using good posture, sit on a stable chair or stand up. 2. Slowly turn your head to the side as if you are looking over your left / right shoulder. ? Keep your eyes level with the ground. ? Stop when you feel a stretch along the side and the back of your neck. 3. Hold for __________ seconds. 4. Repeat this by turning to your other side. Repeat __________ times. Complete this exercise __________ times a day. Exercise C: Thoracic extension and pectoral stretch 1. Roll a towel or a small blanket so it is about 4 inches (10 cm) in diameter. 2. Lie down on your back on a firm surface. 3. Put the towel lengthwise, under your spine in the middle of your back. It should not be not under your shoulder blades. The towel should line up with your spine from your middle back to your lower back. 4. Put your hands behind your head and let your  elbows fall out to your sides. 5. Hold for __________ seconds. Repeat __________ times. Complete this exercise __________ times a day. Strengthening exercises These exercises build strength and endurance in your neck. Endurance is the ability to use your muscles for a long time, even after your muscles get tired. Exercise D: Upper cervical flexion, isometric 1. Lie on your back with a thin pillow behind your head and a small rolled-up towel under your neck. 2. Gently tuck your chin toward your chest and nod your head down to look toward your feet. Do not lift your head off the pillow. 3. Hold for __________ seconds. 4. Release the tension slowly. Relax your neck muscles completely before you repeat this exercise. Repeat __________ times. Complete this exercise __________ times a day. Exercise E: Cervical extension, isometric  1. Stand about 6 inches (15 cm) away from a wall, with your back facing the wall. 2. Place a soft object, about 6-8 inches (15-20 cm) in diameter, between the back of your head and the wall. A soft object could be a small pillow, a ball, or a folded towel. 3. Gently tilt your head back and press into the soft object. Keep your jaw and forehead relaxed. 4. Hold for __________ seconds. 5. Release the tension slowly. Relax your neck muscles completely before you repeat this exercise. Repeat __________ times. Complete this exercise __________ times a day. Posture and body mechanics Body mechanics refers to the movements and positions of your   body while you do your daily activities. Posture is part of body mechanics. Good posture and healthy body mechanics can help to relieve stress in your body's tissues and joints. Good posture means that your spine is in its natural S-curve position (your spine is neutral), your shoulders are pulled back slightly, and your head is not tipped forward. The following are general guidelines for applying improved posture and body mechanics to your  everyday activities. Standing   When standing, keep your spine neutral and keep your feet about hip-width apart. Keep a slight bend in your knees. Your ears, shoulders, and hips should line up.  When you do a task in which you stand in one place for a long time, place one foot up on a stable object that is 2-4 inches (5-10 cm) high, such as a footstool. This helps keep your spine neutral. Sitting   When sitting, keep your spine neutral and your keep feet flat on the floor. Use a footrest, if necessary, and keep your thighs parallel to the floor. Avoid rounding your shoulders, and avoid tilting your head forward.  When working at a desk or a computer, keep your desk at a height where your hands are slightly lower than your elbows. Slide your chair under your desk so you are close enough to maintain good posture.  When working at a computer, place your monitor at a height where you are looking straight ahead and you do not have to tilt your head forward or downward to look at the screen. Resting When lying down and resting, avoid positions that are most painful for you. Try to support your neck in a neutral position. You can use a contour pillow or a small rolled-up towel. Your pillow should support your neck but not push on it. This information is not intended to replace advice given to you by your health care provider. Make sure you discuss any questions you have with your health care provider. Document Released: 12/17/2005 Document Revised: 08/23/2016 Document Reviewed: 11/23/2015 [employer]vier Interactive Patient Education  2019 [employer]  

## 2019-05-11 NOTE — Progress Notes (Signed)
Tina Stone - 69 y.o. female MRN [id number]  Date of birth: [birth year]  Subjective Chief Complaint  Patient presents with  . Neck Pain    R.< L , brother recent passing, stress related ?     HPI Tina Stone is a 69 y.o. female here today with complaint of neck pain.  Pain is located on the right side of her neck.  Her brother recently passed away and she thinks it could be related to stress.  She has not tried anything at home so far. Pain worsened with positional changes of her head.   She denies radiation of pain, numbness, tingling or weakness into the arm. Pain is affecting her sleep.    ROS:  A comprehensive ROS was completed and negative except as noted per HPI   Allergies  Allergen Reactions  . Benicar Hct [Olmesartan Medoxomil-Hctz] Shortness Of Breath and Palpitations  . Bactrim [Sulfamethoxazole-Trimethoprim] Other (See Comments)    Abdominal pain, dizziness  . Pravastatin Other (See Comments)    Lower abdominal pain  . Shellfish Allergy Nausea And Vomiting  . Amlodipine Palpitations    Past Medical History:  Diagnosis Date  . Adenomatous colon polyp   . Allergic rhinitis, seasonal   . Allergy   . Beta thalassemia trait 11/25/2013  . Cholelithiasis   . Class 3 obesity without serious comorbidity with body mass index (BMI) of 40.0 to 44.9 in adult 11/19/2012  . GERD (gastroesophageal reflux disease)   . HLD (hyperlipidemia)   . Hypercholesterolemia   . Hypertension 03/18/2017   no meds   . Intraductal papilloma of left breast    Patient underwent left needle-localized lumpectomy by Dr. Imogene Burn. Tsuei on 09/09/2013; pathology showed intraductal papilloma with no atypia or malignancy identified.  Marland Kitchen PONV (postoperative nausea and vomiting)   . Prediabetes 03/18/2017   History of elevated glucose with A1c of 5.8% in 2013. This is consistent with obesity and possible metabolic syndrome. Plan to check hemoglobin A1c today and annually.  Marland Kitchen Uterine fibroid      Past Surgical History:  Procedure Laterality Date  . [place]   right  . BREAST EXCISIONAL BIOPSY Left 2014   benign  . BREAST LUMPECTOMY WITH NEEDLE LOCALIZATION Left 09/09/2013   Procedure: BREAST LUMPECTOMY WITH NEEDLE LOCALIZATION;  Surgeon: Imogene Burn. Georgette Dover, MD;  Location: [facility];  Service: General;  Laterality: Left;  . COLONOSCOPY    . POLYPECTOMY      Social History   Socioeconomic History  . Marital status: Widowed    Spouse name: Not on file  . Number of children: Not on file  . Years of education: Not on file  . Highest education level: Not on file  Occupational History  . Not on file  Social Needs  . Financial resource strain: Not on file  . Food insecurity:    Worry: Not on file    Inability: Not on file  . Transportation needs:    Medical: Not on file    Non-medical: Not on file  Tobacco Use  . Smoking status: Former Research scientist (life sciences)  . Smokeless tobacco: Never Used  . Tobacco comment: few puffs but not a true smoker   Substance and Sexual Activity  . Alcohol use: Never    Frequency: Never  . Drug use: Never  . Sexual activity: Not on file  Lifestyle  . Physical activity:    Days per week: Not on file    Minutes per session: Not  on file  . Stress: Not on file  Relationships  . Social connections:    Talks on phone: Not on file    Gets together: Not on file    Attends religious service: Not on file    Active member of club or organization: Not on file    Attends meetings of clubs or organizations: Not on file    Relationship status: Not on file  Other Topics Concern  . 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 Diabetes Mother   . Hypertension Mother   . Colon polyps Mother 39  . Dementia Mother 38  . Diabetes Father   . Congestive Heart Failure Father   .  Pancreatic cancer Paternal Aunt   . Colon cancer Neg Hx   . Breast cancer Neg Hx   . Lung cancer Neg Hx   . Esophageal cancer Neg Hx   . Rectal cancer Neg Hx   . Stomach cancer Neg Hx     Health Maintenance  Topic Date Due  . PNA vac Low Risk Adult (2 of 2 - PPSV23) 05/13/2019  . INFLUENZA VACCINE  08/01/2019  . MAMMOGRAM  07/28/2020  . TETANUS/TDAP  08/05/2023  . COLONOSCOPY  10/07/2023  . DEXA SCAN  Completed  . Hepatitis C Screening  Completed    ----------------------------------------------------------------------------------------------------------------------------------------------------------------------------------------------------------------- Physical Exam BP (!) 154/92   Pulse (!) 103   Temp 97.8 F (36.6 C) (Oral)   Wt 214 lb (97.1 kg)   SpO2 98%   BMI 36.73 kg/m   Physical Exam Constitutional:      Appearance: Normal appearance.  HENT:     Head: Normocephalic and atraumatic.     Mouth/Throat:     Mouth: Mucous membranes are moist.  Eyes:     General: No scleral icterus. Cardiovascular:     Rate and Rhythm: Normal rate and regular rhythm.  Pulmonary:     Effort: Pulmonary effort is normal.     Breath sounds: Normal breath sounds.  Musculoskeletal:     Comments: ttp and spasm noted in upper trapezius and paracervicals on the R.  ROM of arm and neck are normal.    Neurological:     General: No focal deficit present.     Mental Status: She is alert.  Psychiatric:        Mood and Affect: Mood normal.        Behavior: Behavior normal.     ------------------------------------------------------------------------------------------------------------------------------------------------------------------------------------------------------------------- Assessment and Plan  Trapezius muscle spasm -Possibly related to stress of her brother recently passing away. Offered condolences. -Discussed relaxation techniques -Given handout of HEP -Rx for  tizanidine as needed for spasm.  -She will let me know if not improving or worsening symptoms.   Hypertension -BP elevated today, has been better controlled at home.  Pain likely contributing today.

## 2019-05-14 ENCOUNTER — Telehealth: Payer: Self-pay | Admitting: Family Medicine

## 2019-05-14 NOTE — Telephone Encounter (Signed)
Copied from [place]. Topic: Referral - Request for Referral >> May 14, 2019  8:21 AM Virl Axe D wrote: Has patient seen PCP for this complaint? No *If NO, is insurance requiring patient see PCP for this issue before PCP can refer them? Referral for which specialty: Mammogram Preferred provider/office: [facility] Reason for referral: Tenderness in left breast

## 2019-05-14 NOTE — Telephone Encounter (Signed)
Copied from [place] [id number]. Topic: Referral - Request for Referral >> May 14, 2019  3:39 PM Mathis Bud wrote: Has patient seen PCP for this complaint? No, patient wants to go Referral for which specialty: The breast center  Preferred provider/office: The breast center of [facility]  Reason for referral: Tenderness on left breast. Tenderness is off and on. Noticeable.

## 2019-05-15 NOTE — Telephone Encounter (Signed)
She just had mammogram last year.  Recommend F2F visit for breast exam to evaluate for mass and discuss.

## 2019-05-15 NOTE — Telephone Encounter (Signed)
Pt was called . Scheduled F2F for 05/18/2019 @ 9:30 am .

## 2019-05-18 ENCOUNTER — Ambulatory Visit: Payer: Medicare Other | Admitting: Family Medicine

## 2019-05-20 ENCOUNTER — Ambulatory Visit (INDEPENDENT_AMBULATORY_CARE_PROVIDER_SITE_OTHER): Payer: Medicare Other | Admitting: Family Medicine

## 2019-05-20 ENCOUNTER — Encounter: Payer: Self-pay | Admitting: Family Medicine

## 2019-05-20 VITALS — BP 140/96 | HR 75 | Temp 97.5°F | Resp 20 | Ht 64.0 in | Wt 213.0 lb

## 2019-05-20 DIAGNOSIS — N644 Mastodynia: Secondary | ICD-10-CM | POA: Insufficient documentation

## 2019-05-20 MED FILL — tiZANidine HCL 4 MG TABS: 4 | 10 days supply | Qty: 30 | Fill #0

## 2019-05-20 NOTE — Progress Notes (Signed)
Tina Stone - 69 y.o. female MRN [id number]  Date of birth: 07-02-1950  Subjective Chief Complaint  Patient presents with  . Breast Pain    Left breast - tenderness around same area as past biopsy     HPI Tina Stone is a 69 y.o. female here today with complaint of L breast pain.  She reports that pain started about 1 week ago.  Pain located around site of previous biopsy.  She has not noticed any lumps or changes to the breast.  She denies lymph node swelling or nipple discharge.  Prior bx was benign and she is up to date on mammograms.   ROS:  A comprehensive ROS was completed and negative except as noted per HPI  Allergies  Allergen Reactions  . Benicar Hct [Olmesartan Medoxomil-Hctz] Shortness Of Breath and Palpitations  . Bactrim [Sulfamethoxazole-Trimethoprim] Other (See Comments)    Abdominal pain, dizziness  . Pravastatin Other (See Comments)    Lower abdominal pain  . Shellfish Allergy Nausea And Vomiting  . Amlodipine Palpitations    Past Medical History:  Diagnosis Date  . Adenomatous colon polyp   . Allergic rhinitis, seasonal   . Allergy   . Beta thalassemia trait 11/25/2013  . Cholelithiasis   . Class 3 obesity without serious comorbidity with body mass index (BMI) of 40.0 to 44.9 in adult 11/19/2012  . GERD (gastroesophageal reflux disease)   . HLD (hyperlipidemia)   . Hypercholesterolemia   . Hypertension 03/18/2017   no meds   . Intraductal papilloma of left breast    Patient underwent left needle-localized lumpectomy by Dr. Imogene Burn. Tsuei on 09/09/2013; pathology showed intraductal papilloma with no atypia or malignancy identified.  Marland Kitchen PONV (postoperative nausea and vomiting)   . Prediabetes 03/18/2017   History of elevated glucose with A1c of 5.8% in 2013. This is consistent with obesity and possible metabolic syndrome. Plan to check hemoglobin A1c today and annually.  Marland Kitchen Uterine fibroid     Past Surgical History:  Procedure Laterality Date  .  [place]   right  . BREAST EXCISIONAL BIOPSY Left 2014   benign  . BREAST LUMPECTOMY WITH NEEDLE LOCALIZATION Left 09/09/2013   Procedure: BREAST LUMPECTOMY WITH NEEDLE LOCALIZATION;  Surgeon: Imogene Burn. Georgette Dover, MD;  Location: [facility];  Service: General;  Laterality: Left;  . COLONOSCOPY    . POLYPECTOMY      Social History   Socioeconomic History  . Marital status: Widowed    Spouse name: Not on file  . Number of children: Not on file  . Years of education: Not on file  . Highest education level: Not on file  Occupational History  . Not on file  Social Needs  . Financial resource strain: Not on file  . Food insecurity:    Worry: Not on file    Inability: Not on file  . Transportation needs:    Medical: Not on file    Non-medical: Not on file  Tobacco Use  . Smoking status: Former Research scientist (life sciences)  . Smokeless tobacco: Never Used  . Tobacco comment: few puffs but not a true smoker   Substance and Sexual Activity  . Alcohol use: Never    Frequency: Never  . Drug use: Never  . Sexual activity: Not on file  Lifestyle  . Physical activity:    Days per week: Not on file    Minutes per session: Not on file  . Stress: Not on file  Relationships  .  Social connections:    Talks on phone: Not on file    Gets together: Not on file    Attends religious service: Not on file    Active member of club or organization: Not on file    Attends meetings of clubs or organizations: Not on file    Relationship status: Not on file  Other Topics Concern  . 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 Diabetes Mother   . Hypertension Mother   . Colon polyps Mother 78  . Dementia Mother 61  . Diabetes Father   . Congestive Heart Failure Father   . Pancreatic cancer Paternal Aunt   . Colon cancer Neg Hx    . Breast cancer Neg Hx   . Lung cancer Neg Hx   . Esophageal cancer Neg Hx   . Rectal cancer Neg Hx   . Stomach cancer Neg Hx     Health Maintenance  Topic Date Due  . PNA vac Low Risk Adult (2 of 2 - PPSV23) 05/13/2019  . INFLUENZA VACCINE  08/01/2019  . MAMMOGRAM  07/28/2020  . TETANUS/TDAP  08/05/2023  . COLONOSCOPY  10/07/2023  . DEXA SCAN  Completed  . Hepatitis C Screening  Completed    ----------------------------------------------------------------------------------------------------------------------------------------------------------------------------------------------------------------- Physical Exam BP (!) 140/96   Pulse 75   Temp (!) 97.5 F (36.4 C) (Oral)   Resp 20   Ht 5\' 4"  (1.626 m)   Wt 213 lb (96.6 kg)   SpO2 100%   BMI 36.56 kg/m   Physical Exam Constitutional:      Appearance: Normal appearance.  HENT:     Head: Normocephalic and atraumatic.  Eyes:     General: No scleral icterus. Neck:     Musculoskeletal: Neck supple.  Chest:     Comments: Small scar from previous bx at ~5 o'clock on L breast.  Mildly tender no masses noted. No lymphadenopathy or nipple discharge.  R breast normal.  Skin:    General: Skin is warm.     Findings: No rash.  Neurological:     General: No focal deficit present.     Mental Status: She is alert.  Psychiatric:        Mood and Affect: Mood normal.        Behavior: Behavior normal.     ------------------------------------------------------------------------------------------------------------------------------------------------------------------------------------------------------------------- Assessment and Plan  Breast pain, left -Possibly related to scar tissue from previous bx given location.  -diagnostic mammogram ordered

## 2019-05-20 NOTE — Assessment & Plan Note (Addendum)
-  Possibly related to scar tissue from previous bx given location.  -diagnostic mammogram ordered

## 2019-05-20 NOTE — Patient Instructions (Signed)

## 2019-05-21 ENCOUNTER — Other Ambulatory Visit: Payer: Self-pay | Admitting: Family Medicine

## 2019-05-21 DIAGNOSIS — N644 Mastodynia: Secondary | ICD-10-CM

## 2019-06-11 ENCOUNTER — Other Ambulatory Visit: Payer: Self-pay

## 2019-06-11 ENCOUNTER — Ambulatory Visit
Admission: RE | Admit: 2019-06-11 | Discharge: 2019-06-11 | Disposition: A | Discharge status: Home / Self Care | Payer: Medicare Other | Source: Ambulatory Visit | Attending: Family Medicine | Admitting: Family Medicine

## 2019-06-11 DIAGNOSIS — R928 Other abnormal and inconclusive findings on diagnostic imaging of breast: Secondary | ICD-10-CM | POA: Diagnosis not present

## 2019-06-11 DIAGNOSIS — N644 Mastodynia: Secondary | ICD-10-CM

## 2019-06-15 ENCOUNTER — Other Ambulatory Visit: Payer: Self-pay | Admitting: Family Medicine

## 2019-06-15 DIAGNOSIS — Z1231 Encounter for screening mammogram for malignant neoplasm of breast: Secondary | ICD-10-CM

## 2019-06-16 DIAGNOSIS — H35033 Hypertensive retinopathy, bilateral: Secondary | ICD-10-CM | POA: Diagnosis not present

## 2019-06-16 DIAGNOSIS — H25013 Cortical age-related cataract, bilateral: Secondary | ICD-10-CM | POA: Diagnosis not present

## 2019-06-16 DIAGNOSIS — H40013 Open angle with borderline findings, low risk, bilateral: Secondary | ICD-10-CM | POA: Diagnosis not present

## 2019-06-16 DIAGNOSIS — H2513 Age-related nuclear cataract, bilateral: Secondary | ICD-10-CM | POA: Diagnosis not present

## 2019-07-15 ENCOUNTER — Telehealth: Payer: Self-pay | Admitting: Family Medicine

## 2019-07-15 NOTE — Telephone Encounter (Signed)
Is she sure she is looking on her [facility] MyChart?  It appears she is up to date on immunizations other than pneumovax and influenza this fall.  She had flu vaccine 09/2018, not due until fall of this year.  She received 1 pneumonia vaccine last year (prevnar 13). She should receive the 2nd pneumonia vaccine this year.  It would be ok to wait and do this at the same time as the flu vaccine She received tdap in 2014 and would not be due until 2024.

## 2019-07-15 NOTE — Telephone Encounter (Signed)
Sent a Therapist, music. Will F/U call tomorrow.

## 2019-07-15 NOTE — Telephone Encounter (Signed)
Please advise. I understand that Flu is not available for at least 2 more months.

## 2019-07-15 NOTE — Telephone Encounter (Signed)
Pt called and based on her mychart it said her Flu, pneumonia, and tetanus are do, I know the Flu shots aren't in yet and I was told probably wont be in till around October. She said she wanted to get them all done at the same time but doesn't want to wait until around October to get them done. She wants to know what she should do

## 2019-07-16 NOTE — Telephone Encounter (Signed)
Called Pt today to F/U MyChart message. Pt stated she was just worried about her immune system. Advised Pt to call/MyChart message to ask if our clinic has the Flu immunization in stock. Pt was happy about this call and information. Task completed.

## 2019-07-18 ENCOUNTER — Emergency Department (HOSPITAL_COMMUNITY)
Admission: EM | Admit: 2019-07-18 | Discharge: 2019-07-19 | Disposition: A | Discharge status: Home / Self Care | Payer: Medicare Other | Attending: Emergency Medicine | Admitting: Emergency Medicine

## 2019-07-18 ENCOUNTER — Emergency Department (HOSPITAL_COMMUNITY): Payer: Medicare Other

## 2019-07-18 ENCOUNTER — Encounter (HOSPITAL_COMMUNITY): Payer: Self-pay

## 2019-07-18 DIAGNOSIS — Z87891 Personal history of nicotine dependence: Secondary | ICD-10-CM | POA: Insufficient documentation

## 2019-07-18 DIAGNOSIS — R7309 Other abnormal glucose: Secondary | ICD-10-CM | POA: Diagnosis not present

## 2019-07-18 DIAGNOSIS — R1011 Right upper quadrant pain: Secondary | ICD-10-CM | POA: Diagnosis present

## 2019-07-18 DIAGNOSIS — Z79899 Other long term (current) drug therapy: Secondary | ICD-10-CM | POA: Insufficient documentation

## 2019-07-18 DIAGNOSIS — I10 Essential (primary) hypertension: Secondary | ICD-10-CM | POA: Insufficient documentation

## 2019-07-18 DIAGNOSIS — K8050 Calculus of bile duct without cholangitis or cholecystitis without obstruction: Secondary | ICD-10-CM

## 2019-07-18 DIAGNOSIS — K8020 Calculus of gallbladder without cholecystitis without obstruction: Secondary | ICD-10-CM | POA: Diagnosis not present

## 2019-07-18 MED ORDER — MORPHINE SULFATE (PF) 4 MG/ML IV SOLN
4.0000 mg | Freq: Once | INTRAVENOUS | Status: DC
  Filled 2019-07-18: qty 1

## 2019-07-18 MED ORDER — MORPHINE SULFATE (PF) 2 MG/ML IV SOLN
2.0000 mg | Freq: Once | INTRAVENOUS | Status: AC
  Administered 2019-07-19: 2 mg via INTRAVENOUS
  Filled 2019-07-18: qty 1

## 2019-07-18 MED ORDER — SODIUM CHLORIDE 0.9 % IV BOLUS
1000.0000 mL | Freq: Once | INTRAVENOUS | Status: AC
  Administered 2019-07-18: 1000 mL via INTRAVENOUS

## 2019-07-18 MED ORDER — ONDANSETRON HCL 4 MG/2ML IJ SOLN
4.0000 mg | Freq: Once | INTRAMUSCULAR | Status: AC
  Administered 2019-07-18: 4 mg via INTRAVENOUS
  Filled 2019-07-18: qty 2

## 2019-07-18 NOTE — ED Triage Notes (Signed)
Pt reports epigastric abdominal pain that started around 9a. She denies nausea or vomiting. No diarrhea. Denies sick contacts.

## 2019-07-18 NOTE — ED Provider Notes (Signed)
[facility] DEPT Provider Note   CSN: [id number] Arrival date & time: 07/18/19  2303    History   Chief Complaint Chief Complaint  Patient presents with  . Abdominal Pain    HPI Tina Stone is a 69 y.o. female.   The history is provided by the patient.  Abdominal Pain She has a history of hypertension, hyperlipidemia, pre-diabetes, cholelithiasis and comes in with upper abdominal pain since 9:00 AM. There is associated nausea without vomiting. There has been no constipation or diarrhea. Pain is crampy and severe - she rates it at 10/10. It sometimes radiates to her mid-abdomen, but not to her chest or shoulder or back. She took some mild of magnesia, with no relief. She had not eaten since 6:00 PM yesterday. She has not h ad pain like this before.  Past Medical History:  Diagnosis Date  . Adenomatous colon polyp   . Allergic rhinitis, seasonal   . Allergy   . Beta thalassemia trait 11/25/2013  . Cholelithiasis   . Class 3 obesity without serious comorbidity with body mass index (BMI) of 40.0 to 44.9 in adult 11/19/2012  . GERD (gastroesophageal reflux disease)   . HLD (hyperlipidemia)   . Hypercholesterolemia   . Hypertension 03/18/2017   no meds   . Intraductal papilloma of left breast    Patient underwent left needle-localized lumpectomy by Dr. Imogene Burn. Tsuei on 09/09/2013; pathology showed intraductal papilloma with no atypia or malignancy identified.  Marland Kitchen PONV (postoperative nausea and vomiting)   . Prediabetes 03/18/2017   History of elevated glucose with A1c of 5.8% in 2013. This is consistent with obesity and possible metabolic syndrome. Plan to check hemoglobin A1c today and annually.  Marland Kitchen Uterine fibroid     Patient Active Problem List   Diagnosis Date Noted  . Breast pain, left 05/20/2019  . Trapezius muscle spasm 05/11/2019  . Elevated glucose 02/13/2019  . Epigastric pain 01/29/2019  . Prediabetes 03/18/2017  .  Hypertension 03/18/2017  . Beta thalassemia trait 11/25/2013  . Obesity 11/19/2012  . Preventative health care 07/12/2011  . Gastroesophageal reflux disease 01/28/2007  . Hyperlipidemia 10/10/2006    Past Surgical History:  Procedure Laterality Date  . [place]   right  . BREAST EXCISIONAL BIOPSY Left 2014   benign  . BREAST LUMPECTOMY WITH NEEDLE LOCALIZATION Left 09/09/2013   Procedure: BREAST LUMPECTOMY WITH NEEDLE LOCALIZATION;  Surgeon: Imogene Burn. Georgette Dover, MD;  Location: [facility];  Service: General;  Laterality: Left;  . COLONOSCOPY    . POLYPECTOMY       OB History   No obstetric history on file.      Home Medications    Prior to Admission medications   Medication Sig Start Date End Date Taking? Authorizing Provider  ALOE VERA PO Take by mouth. 2 tbsp prn    [provider]  APPLE CIDER VINEGAR PO Take by mouth. 1 tsp daily    [provider]  EPINEPHrine (EPIPEN 2-PAK) 0.3 mg/0.3 mL IJ SOAJ injection Inject 0.3 mLs (0.3 mg total) into the muscle as needed for anaphylaxis. 03/05/19   Maudie Flakes, MD  olmesartan (BENICAR) 5 MG tablet Take 1 tablet (5 mg total) by mouth daily. 05/06/19   Luetta Nutting, DO  potassium chloride (K-DUR) 10 MEQ tablet  02/11/19   [provider]  tiZANidine (ZANAFLEX) 4 MG tablet Take 1 tablet (4 mg total) by mouth every 8 (eight) hours as needed for muscle  spasms. 05/11/19   Luetta Nutting, DO    Family History Family History  Problem Relation Age of Onset  . Diabetes Mother   . Hypertension Mother   . Colon polyps Mother 36  . Dementia Mother 59  . Diabetes Father   . Congestive Heart Failure Father   . Pancreatic cancer Paternal Aunt   . Colon cancer Neg Hx   . Breast cancer Neg Hx   . Lung cancer Neg Hx   . Esophageal cancer Neg Hx   . Rectal cancer Neg Hx   . Stomach cancer Neg Hx     Social History Social History   Tobacco Use  . Smoking status: Former Research scientist (life sciences)  . Smokeless  tobacco: Never Used  . Tobacco comment: few puffs but not a true smoker   Substance Use Topics  . Alcohol use: Never    Frequency: Never  . Drug use: Never     Allergies   Benicar hct [olmesartan medoxomil-hctz], Bactrim [sulfamethoxazole-trimethoprim], Pravastatin, Shellfish allergy, and Amlodipine   Review of Systems Review of Systems  Gastrointestinal: Positive for abdominal pain.  All other systems reviewed and are negative.    Physical Exam Updated Vital Signs BP (!) 173/106 (BP Location: Left Arm)   Pulse (!) 101   Temp 98.3 F (36.8 C) (Oral)   Resp 16   SpO2 100%   Physical Exam Vitals signs and nursing note reviewed.    69 year old female, appears uncomfortable, but is in no acute distress. Vital signs are significant for elevated blood pressure and borderline elevated heart rate. Oxygen saturation is 100%, which is normal. Head is normocephalic and atraumatic. PERRLA, EOMI. Oropharynx is clear. Neck is nontender and supple without adenopathy or JVD. Back is nontender and there is no CVA tenderness. Lungs are clear without rales, wheezes, or rhonchi. Chest is nontender. Heart has regular rate and rhythm without murmur. Abdomen is soft, flat, with well localized RUQ tenderness. There is no rebound or guarding. Murphy's sign is equivocal. There are no masses or hepatosplenomegaly and peristalsis is hypoactive. Extremities have no cyanosis or edema, full range of motion is present. Skin is warm and dry without rash. Neurologic: Mental status is normal, cranial nerves are intact, there are no motor or sensory deficits.  ED Treatments / Results  Labs (all labs ordered are listed, but only abnormal results are displayed) Labs Reviewed  COMPREHENSIVE METABOLIC PANEL - Abnormal; Notable for the following components:      Result Value   Glucose, Bld 129 (*)    All other components within normal limits  CBC WITH DIFFERENTIAL/PLATELET - Abnormal; Notable for the  following components:   MCV 78.4 (*)    MCH 25.1 (*)    Neutro Abs 7.9 (*)    All other components within normal limits  LIPASE, BLOOD  URINALYSIS, ROUTINE W REFLEX MICROSCOPIC    Radiology US Abdomen Limited  Result Date: 07/19/2019 CLINICAL DATA:  Abdominal pain. EXAM: ULTRASOUND ABDOMEN LIMITED RIGHT UPPER QUADRANT COMPARISON:  None. FINDINGS: Gallbladder: Physiologically distended. Multiple gallstones. No gallbladder wall thickening. No pericholecystic fluid. No sonographic Murphy sign noted by sonographer. Common bile duct: Diameter: 7 mm.  No visualized choledocholithiasis. Liver: No focal lesion identified. Within normal limits in parenchymal echogenicity. Portal vein is patent on color Doppler imaging with normal direction of blood flow towards the liver. IMPRESSION: 1. Gallstones without sonographic findings of acute cholecystitis. 2. Upper normal common bile duct at 7 mm. No visualized choledocholithiasis. Electronically Signed   By:  Keith Rake M.D.   On: 07/19/2019 00:37    Procedures Procedures   Medications Ordered in ED Medications  ondansetron (ZOFRAN) injection 4 mg (4 mg Intravenous Given 07/18/19 2354)  sodium chloride 0.9 % bolus 1,000 mL (1,000 mLs Intravenous New Bag/Given 07/18/19 2355)  morphine 2 MG/ML injection 2 mg (2 mg Intravenous Given 07/19/19 0021)     Initial Impression / Assessment and Plan / ED Course  I have reviewed the triage vital signs and the nursing notes.  Pertinent labs & imaging results that were available during my care of the patient were reviewed by me and considered in my medical decision making (see chart for details).  RUQ pain strongly suggestive of biliary colic. She has known gallstones. Old records were reviewed, confirming abdominal ultrasounds in 2005, 2007 and 2009 showing cholelithiasis. Will give IV fluids, morphine, ondansetron and check ultrasound to look for signs of cholecystitis. Other diagnostic possibilities include  PUD, pancreatitis, diverticulitis.  Patient was reluctant to take morphine, so she was given a very small dose.  This did give some partial improvement of her pain.  Ultrasound shows cholelithiasis without evidence of cholecystitis.  Labs are significant only for mildly elevated glucose.  She is discharged with prescription for hydrocodone-acetaminophen and ondansetron and is referred to general surgery for evaluation for elective cholecystectomy.  Patient advised of mildly elevated glucose and need to follow that through her primary care provider.  Return precautions discussed.  Final Clinical Impressions(s) / ED Diagnoses   Final diagnoses:  None    ED Discharge Orders    None       Delora Fuel, MD 41/28/78 [phone]

## 2019-07-19 DIAGNOSIS — K8020 Calculus of gallbladder without cholecystitis without obstruction: Secondary | ICD-10-CM | POA: Diagnosis not present

## 2019-07-19 LAB — CBC WITH DIFFERENTIAL/PLATELET
Abs Immature Granulocytes: 0.03 10*3/uL (ref 0.00–0.07)
Basophils Absolute: 0 10*3/uL (ref 0.0–0.1)
Basophils Relative: 0 %
Eosinophils Absolute: 0 10*3/uL (ref 0.0–0.5)
Eosinophils Relative: 0 %
HCT: 40 % (ref 36.0–46.0)
Hemoglobin: 12.8 g/dL (ref 12.0–15.0)
Immature Granulocytes: 0 %
Lymphocytes Relative: 8 %
Lymphs Abs: 0.8 10*3/uL (ref 0.7–4.0)
MCH: 25.1 pg — ABNORMAL LOW (ref 26.0–34.0)
MCHC: 32 g/dL (ref 30.0–36.0)
MCV: 78.4 fL — ABNORMAL LOW (ref 80.0–100.0)
Monocytes Absolute: 0.4 10*3/uL (ref 0.1–1.0)
Monocytes Relative: 4 %
Neutro Abs: 7.9 10*3/uL — ABNORMAL HIGH (ref 1.7–7.7)
Neutrophils Relative %: 88 %
Platelets: 243 10*3/uL (ref 150–400)
RBC: 5.1 MIL/uL (ref 3.87–5.11)
RDW: 14.9 % (ref 11.5–15.5)
WBC: 9.1 10*3/uL (ref 4.0–10.5)
nRBC: 0 % (ref 0.0–0.2)

## 2019-07-19 LAB — COMPREHENSIVE METABOLIC PANEL
ALT: 15 U/L (ref 0–44)
AST: 21 U/L (ref 15–41)
Albumin: 4 g/dL (ref 3.5–5.0)
Alkaline Phosphatase: 82 U/L (ref 38–126)
Anion gap: 12 (ref 5–15)
BUN: 10 mg/dL (ref 8–23)
CO2: 23 mmol/L (ref 22–32)
Calcium: 8.9 mg/dL (ref 8.9–10.3)
Chloride: 100 mmol/L (ref 98–111)
Creatinine, Ser: 0.78 mg/dL (ref 0.44–1.00)
GFR calc Af Amer: >60 mL/min (ref >60)
GFR calc non Af Amer: >60 mL/min (ref >60)
Glucose, Bld: 129 mg/dL — ABNORMAL HIGH (ref 70–99)
Potassium: 3.5 mmol/L (ref 3.5–5.1)
Sodium: 135 mmol/L (ref 135–145)
Total Bilirubin: 0.8 mg/dL (ref 0.3–1.2)
Total Protein: 7.5 g/dL (ref 6.5–8.1)

## 2019-07-19 LAB — LIPASE, BLOOD: Lipase: 34 U/L (ref 11–51)

## 2019-07-19 MED ORDER — HYDROCODONE-ACETAMINOPHEN 5-325 MG PO TABS: 1.0000 | ORAL_TABLET | ORAL | 0 refills | Status: DC | PRN

## 2019-07-19 MED ORDER — ONDANSETRON HCL 4 MG PO TABS: 4.0000 mg | ORAL_TABLET | Freq: Four times a day (QID) | ORAL | 0 refills | Status: DC | PRN

## 2019-07-19 NOTE — Discharge Instructions (Addendum)
Return if pain is getting worse.  Your blood sugar was a little high today - 129. Your primary care provider will need to monitor that to make sure you are not developing diabetes.

## 2019-07-21 MED FILL — HYDROCODON-APAP 5-325: 5-325 | 2 days supply | Qty: 10 | Fill #0

## 2019-07-22 MED FILL — ONDANSETRON HCL 4 MG TABLET: 4 | 4 days supply | Qty: 12 | Fill #0

## 2019-07-24 ENCOUNTER — Telehealth: Payer: Self-pay

## 2019-07-24 NOTE — Telephone Encounter (Signed)
Questions for Screening COVID-19  Symptom onset: No, Prescreen/ [phone]  Travel or Contacts: none  During this illness, did/does the patient experience any of the following symptoms? Fever >100.54F []   Yes [x]   No []   Unknown Subjective fever (felt feverish) []   Yes [x]   No []   Unknown Chills []   Yes [x]   No []   Unknown Muscle aches (myalgia) []   Yes [x]   No []   Unknown Runny nose (rhinorrhea) []   Yes [x]   No []   Unknown Sore throat []   Yes [x]   No []   Unknown Cough (new onset or worsening of chronic cough) []   Yes [x]   No []   Unknown Shortness of breath (dyspnea) []   Yes [x]   No []   Unknown Nausea or vomiting []   Yes [x]   No []   Unknown Headache []   Yes []   No [x]   Unknown Abdominal pain  []   Yes [x]   No []   Unknown Diarrhea (?3 loose/looser than normal stools/24hr period) []   Yes [x]   No []   Unknown Other, specify:  Patient risk factors: Smoker? []   Current []   Former [x]   Never If female, currently pregnant? []   Yes [x]   No  Patient Active Problem List   Diagnosis Date Noted  . Breast pain, left 05/20/2019  . Trapezius muscle spasm 05/11/2019  . Elevated glucose 02/13/2019  . Epigastric pain 01/29/2019  . Prediabetes 03/18/2017  . Hypertension 03/18/2017  . Beta thalassemia trait 11/25/2013  . Obesity 11/19/2012  . Preventative health care 07/12/2011  . Gastroesophageal reflux disease 01/28/2007  . Hyperlipidemia 10/10/2006    Plan:  []   High risk for COVID-19 with red flags go to ED (with CP, SOB, weak/lightheaded, or fever > 101.5). Call ahead.  []   High risk for COVID-19 but stable. Inform provider and coordinate time for [facility] visit.   [x]   No red flags but URI signs or symptoms okay for [facility] visit.

## 2019-07-27 ENCOUNTER — Encounter: Payer: Self-pay | Admitting: Family Medicine

## 2019-07-27 ENCOUNTER — Other Ambulatory Visit: Payer: Self-pay

## 2019-07-27 ENCOUNTER — Ambulatory Visit (INDEPENDENT_AMBULATORY_CARE_PROVIDER_SITE_OTHER): Payer: Medicare Other | Admitting: Family Medicine

## 2019-07-27 VITALS — BP 136/84 | HR 85 | Temp 98.0°F | Resp 18 | Ht 64.0 in | Wt 213.0 lb

## 2019-07-27 DIAGNOSIS — K8050 Calculus of bile duct without cholangitis or cholecystitis without obstruction: Secondary | ICD-10-CM | POA: Diagnosis not present

## 2019-07-27 DIAGNOSIS — R7309 Other abnormal glucose: Secondary | ICD-10-CM | POA: Diagnosis not present

## 2019-07-27 NOTE — Assessment & Plan Note (Signed)
Update a1c today . 

## 2019-07-27 NOTE — Assessment & Plan Note (Addendum)
-  She should continue low fat diet.  -Referral placed to surgery for elective cholecystectomy.   -Discussed red flags including severe abdominal pain, jaundice, uncontrolled vomiting or fever.

## 2019-07-27 NOTE — Patient Instructions (Signed)
Cholelithiasis  Cholelithiasis is a form of gallbladder disease in which gallstones form in the gallbladder. The gallbladder is an organ that stores bile. Bile is made in the liver, and it helps to digest fats. Gallstones begin as small crystals and slowly grow into stones. They may cause no symptoms until the gallbladder tightens (contracts) and a gallstone is blocking the duct (gallbladder attack), which can cause pain. Cholelithiasis is also referred to as gallstones. There are two main types of gallstones:  Cholesterol stones. These are made of hardened cholesterol and are usually yellow-green in color. They are the most common type of gallstone. Cholesterol is a white, waxy, fat-like substance that is made in the liver.  Pigment stones. These are dark in color and are made of a red-yellow substance that forms when hemoglobin from red blood cells breaks down (bilirubin). What are the causes? This condition may be caused by an imbalance in the substances that bile is made of. This can happen if the bile:  Has too much bilirubin.  Has too much cholesterol.  Does not have enough bile salts. These salts help the body absorb and digest fats. In some cases, this condition can also be caused by the gallbladder not emptying completely or often enough. What increases the risk? The following factors may make you more likely to develop this condition:  Being female.  Having multiple pregnancies. Health care providers sometimes advise removing diseased gallbladders before future pregnancies.  Eating a diet that is heavy in fried foods, fat, and refined carbohydrates, like white bread and white rice.  Being obese.  Being older than age 40.  Prolonged use of medicines that contain female hormones (estrogen).  Having diabetes mellitus.  Rapidly losing weight.  Having a family history of gallstones.  Being of American Indian or Mexican descent.  Having an intestinal disease such as Crohn  disease.  Having metabolic syndrome.  Having cirrhosis.  Having severe types of anemia such as sickle cell anemia. What are the signs or symptoms? In most cases, there are no symptoms. These are known as silent gallstones. If a gallstone blocks the bile ducts, it can cause a gallbladder attack. The main symptom of a gallbladder attack is sudden pain in the upper right abdomen. The pain usually comes at night or after eating a large meal. The pain can last for one or several hours and can spread to the right shoulder or chest. If the bile duct is blocked for more than a few hours, it can cause infection or inflammation of the gallbladder, liver, or pancreas, which may cause:  Nausea.  Vomiting.  Abdominal pain that lasts for 5 hours or more.  Fever or chills.  Yellowing of the skin or the whites of the eyes (jaundice).  Dark urine.  Light-colored stools. How is this diagnosed? This condition may be diagnosed based on:  A physical exam.  Your medical history.  An ultrasound of your gallbladder.  CT scan.  MRI.  Blood tests to check for signs of infection or inflammation.  A scan of your gallbladder and bile ducts (biliary system) using nonharmful radioactive material and special cameras that can see the radioactive material (cholescintigram). This test checks to see how your gallbladder contracts and whether bile ducts are blocked.  Inserting a small tube with a camera on the end (endoscope) through your mouth to inspect bile ducts and check for blockages (endoscopic retrograde cholangiopancreatogram). How is this treated? Treatment for gallstones depends on the severity of the condition.   Silent gallstones do not need treatment. If the gallstones cause a gallbladder attack or other symptoms, treatment may be required. Options for treatment include:  Surgery to remove the gallbladder (cholecystectomy). This is the most common treatment.  Medicines to dissolve gallstones.  These are most effective at treating small gallstones. You may need to take medicines for up to 6-12 months.  Shock wave treatment (extracorporeal biliary lithotripsy). In this treatment, an ultrasound machine sends shock waves to the gallbladder to break gallstones into smaller pieces. These pieces can then be passed into the intestines or be dissolved by medicine. This is rarely used.  Removing gallstones through endoscopic retrograde cholangiopancreatogram. A small basket can be attached to the endoscope and used to capture and remove gallstones. Follow these instructions at home:  Take over-the-counter and prescription medicines only as told by your health care provider.  Maintain a healthy weight and follow a healthy diet. This includes: ? Reducing fatty foods, such as fried food. ? Reducing refined carbohydrates, like white bread and white rice. ? Increasing fiber. Aim for foods like almonds, fruit, and beans.  Keep all follow-up visits as told by your health care provider. This is important. Contact a health care provider if:  You think you have had a gallbladder attack.  You have been diagnosed with silent gallstones and you develop abdominal pain or indigestion. Get help right away if:  You have pain from a gallbladder attack that lasts for more than 2 hours.  You have abdominal pain that lasts for more than 5 hours.  You have a fever or chills.  You have persistent nausea and vomiting.  You develop jaundice.  You have dark urine or light-colored stools. Summary  Cholelithiasis (also called gallstones) is a form of gallbladder disease in which gallstones form in the gallbladder.  This condition is caused by an imbalance in the substances that make up bile. This can happen if the bile has too much cholesterol, too much bilirubin, or not enough bile salts.  You are more likely to develop this condition if you are female, pregnant, using medicines with estrogen, obese,  older than age 40, or have a family history of gallstones. You may also develop gallstones if you have diabetes, an intestinal disease, cirrhosis, or metabolic syndrome.  Treatment for gallstones depends on the severity of the condition. Silent gallstones do not need treatment.  If gallstones cause a gallbladder attack or other symptoms, treatment may be needed. The most common treatment is surgery to remove the gallbladder. This information is not intended to replace advice given to you by your health care provider. Make sure you discuss any questions you have with your health care provider. Document Released: 12/13/2005 Document Revised: 11/29/2017 Document Reviewed: 09/02/2016 Elsevier Patient Education  2020 Elsevier Inc.  

## 2019-07-27 NOTE — Progress Notes (Signed)
Tina Stone - 69 y.o. female MRN [id number]  Date of birth: [birth year]  Subjective Chief Complaint  Patient presents with  . Hospitalization Follow-up    ED visit acute R rib pain, no further episodes    HPI Tina Stone is a 69 y.o. female here today for ER follow up.  She was seen in ER on 07/18/2019 for RUQ pain and nausea.  RUQ [place] with gallstones without cholecystitis.  Pain managed with small dose of morphine with some relief.  LFT's and lipase were normal.  Her blood glucose was elevated.  Since discharge she has not had any further pain but would like referral to surgeon for elective removal of the gallbladder.  She has changed her diet and is avoiding fatty/fried foods.    ROS:  A comprehensive ROS was completed and negative except as noted per HPI  Allergies  Allergen Reactions  . Benicar Hct [Olmesartan Medoxomil-Hctz] Shortness Of Breath and Palpitations  . Bactrim [Sulfamethoxazole-Trimethoprim] Other (See Comments)    Abdominal pain, dizziness  . Pravastatin Other (See Comments)    Lower abdominal pain  . Shellfish Allergy Nausea And Vomiting  . Amlodipine Palpitations    Past Medical History:  Diagnosis Date  . Adenomatous colon polyp   . Allergic rhinitis, seasonal   . Allergy   . Beta thalassemia trait 11/25/2013  . Cholelithiasis   . Class 3 obesity without serious comorbidity with body mass index (BMI) of 40.0 to 44.9 in adult 11/19/2012  . GERD (gastroesophageal reflux disease)   . HLD (hyperlipidemia)   . Hypercholesterolemia   . Hypertension 03/18/2017   no meds   . Intraductal papilloma of left breast    Patient underwent left needle-localized lumpectomy by Dr. Imogene Burn. Tsuei on 09/09/2013; pathology showed intraductal papilloma with no atypia or malignancy identified.  Marland Kitchen PONV (postoperative nausea and vomiting)   . Prediabetes 03/18/2017   History of elevated glucose with A1c of 5.8% in 2013. This is consistent with obesity and possible  metabolic syndrome. Plan to check hemoglobin A1c today and annually.  Marland Kitchen Uterine fibroid     Past Surgical History:  Procedure Laterality Date  . [place]   right  . BREAST EXCISIONAL BIOPSY Left 2014   benign  . BREAST LUMPECTOMY WITH NEEDLE LOCALIZATION Left 09/09/2013   Procedure: BREAST LUMPECTOMY WITH NEEDLE LOCALIZATION;  Surgeon: Imogene Burn. Georgette Dover, MD;  Location: [facility];  Service: General;  Laterality: Left;  . COLONOSCOPY    . POLYPECTOMY      Social History   Socioeconomic History  . Marital status: Widowed    Spouse name: Not on file  . Number of children: Not on file  . Years of education: Not on file  . Highest education level: Not on file  Occupational History  . Not on file  Social Needs  . Financial resource strain: Not on file  . Food insecurity    Worry: Not on file    Inability: Not on file  . Transportation needs    Medical: Not on file    Non-medical: Not on file  Tobacco Use  . Smoking status: Former Research scientist (life sciences)  . Smokeless tobacco: Never Used  . Tobacco comment: few puffs but not a true smoker   Substance and Sexual Activity  . Alcohol use: Never    Frequency: Never  . Drug use: Never  . Sexual activity: Not on file  Lifestyle  . Physical activity    Days per week:  Not on file    Minutes per session: Not on file  . Stress: Not on file  Relationships  . Social Herbalist on phone: Not on file    Gets together: Not on file    Attends religious service: Not on file    Active member of club or organization: Not on file    Attends meetings of clubs or organizations: Not on file    Relationship status: Not on file  Other Topics Concern  . 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 Diabetes Mother   . Hypertension Mother   . Colon polyps  Mother 41  . Dementia Mother 56  . Diabetes Father   . Congestive Heart Failure Father   . Pancreatic cancer Paternal Aunt   . Colon cancer Neg Hx   . Breast cancer Neg Hx   . Lung cancer Neg Hx   . Esophageal cancer Neg Hx   . Rectal cancer Neg Hx   . Stomach cancer Neg Hx     Health Maintenance  Topic Date Due  . PNA vac Low Risk Adult (2 of 2 - PPSV23) 05/13/2019  . INFLUENZA VACCINE  08/01/2019  . MAMMOGRAM  07/28/2020  . TETANUS/TDAP  08/05/2023  . COLONOSCOPY  10/07/2023  . DEXA SCAN  Completed  . Hepatitis C Screening  Completed    ----------------------------------------------------------------------------------------------------------------------------------------------------------------------------------------------------------------- Physical Exam BP 136/84   Pulse 85   Temp 98 F (36.7 C) (Oral)   Resp 18   Ht 5\' 4"  (1.626 m)   Wt 213 lb (96.6 kg)   SpO2 98%   BMI 36.56 kg/m   Physical Exam HENT:     Head: Normocephalic and atraumatic.     Right Ear: Tympanic membrane normal.     Left Ear: Tympanic membrane normal.     Mouth/Throat:     Mouth: Mucous membranes are moist.  Eyes:     General: No scleral icterus. Cardiovascular:     Rate and Rhythm: Regular rhythm.  Pulmonary:     Effort: Pulmonary effort is normal.  Abdominal:     General: Abdomen is flat. There is no distension.     Tenderness: There is no abdominal tenderness.  Skin:    General: Skin is warm and dry.  Neurological:     General: No focal deficit present.     Mental Status: She is alert.  Psychiatric:        Mood and Affect: Mood normal.        Behavior: Behavior normal.     ------------------------------------------------------------------------------------------------------------------------------------------------------------------------------------------------------------------- Assessment and Plan  Elevated random blood glucose level -Update a1c today.   Biliary  colic -She should continue low fat diet.  -Referral placed to surgery for elective cholecystectomy.   -Discussed red flags including severe abdominal pain, jaundice, uncontrolled vomiting or fever.     >58minutes spent with patient with 50% of time spent counseling and coordination of care as outlined above.

## 2019-07-28 LAB — HEMOGLOBIN A1C
Hgb A1c MFr Bld: 5.2 % of total Hgb (ref <5.7)
Mean Plasma Glucose: 103 (calc)
eAG (mmol/L): 5.7 (calc)

## 2019-07-30 ENCOUNTER — Telehealth: Payer: Self-pay

## 2019-07-30 NOTE — Telephone Encounter (Signed)
I spoke with pt. We went over the results & she verbalized understanding. Advised someone would contact her from the general surgery office for her appt.

## 2019-07-30 NOTE — Telephone Encounter (Signed)
Result note sent via mychart.  A1c was normal, she does not have diabetes.

## 2019-07-30 NOTE — Telephone Encounter (Signed)
Copied from [place] [phone]. Topic: General - Other >> Jul 30, 2019 10:07 AM Ivar Drape wrote: Reason for CRM:   Patient would like the results of her recent labs

## 2019-07-31 ENCOUNTER — Other Ambulatory Visit: Payer: Self-pay

## 2019-07-31 ENCOUNTER — Ambulatory Visit
Admission: RE | Admit: 2019-07-31 | Discharge: 2019-07-31 | Disposition: A | Discharge status: Home / Self Care | Payer: Medicare Other | Source: Ambulatory Visit | Attending: Family Medicine | Admitting: Family Medicine

## 2019-07-31 DIAGNOSIS — Z1231 Encounter for screening mammogram for malignant neoplasm of breast: Secondary | ICD-10-CM | POA: Diagnosis not present

## 2019-08-07 ENCOUNTER — Ambulatory Visit: Payer: Self-pay | Admitting: Surgery

## 2019-08-07 DIAGNOSIS — K8050 Calculus of bile duct without cholangitis or cholecystitis without obstruction: Secondary | ICD-10-CM | POA: Diagnosis not present

## 2019-08-07 NOTE — H&P (Signed)
Surgical H&P CC: biliary colic  HPI: this is a very pleasant 69 year old woman who is referred by the emergency room for biliary colic.  She presented there with persistent right upper quadrant and epigastric pain couple of weeks ago. The patient had begun her on in the morning and her ER evaluation started around 11 PM that day.  Ultimately did get better with small dose of morphine.  Pain was associated with nausea but no emesis, no fevers.  He was not relieved by milk of magnesia.  She denies any prior similar episodes of pain nor any since, but she does note a history of indigestion and heartburn.  Allergies  Allergen Reactions  . Benicar Hct [Olmesartan Medoxomil-Hctz] Shortness Of Breath and Palpitations  . Bactrim [Sulfamethoxazole-Trimethoprim] Other (See Comments)    Abdominal pain, dizziness  . Pravastatin Other (See Comments)    Lower abdominal pain  . Shellfish Allergy Nausea And Vomiting  . Amlodipine Palpitations    Past Medical History:  Diagnosis Date  . Adenomatous colon polyp   . Allergic rhinitis, seasonal   . Allergy   . Beta thalassemia trait 11/25/2013  . Cholelithiasis   . Class 3 obesity without serious comorbidity with body mass index (BMI) of 40.0 to 44.9 in adult 11/19/2012  . GERD (gastroesophageal reflux disease)   . HLD (hyperlipidemia)   . Hypercholesterolemia   . Hypertension 03/18/2017   no meds   . Intraductal papilloma of left breast    Patient underwent left needle-localized lumpectomy by Dr. Imogene Burn. Tsuei on 09/09/2013; pathology showed intraductal papilloma with no atypia or malignancy identified.  Marland Kitchen PONV (postoperative nausea and vomiting)   . Prediabetes 03/18/2017   History of elevated glucose with A1c of 5.8% in 2013. This is consistent with obesity and possible metabolic syndrome. Plan to check hemoglobin A1c today and annually.  Marland Kitchen Uterine fibroid     Past Surgical History:  Procedure Laterality Date  . [place]    right  . BREAST EXCISIONAL BIOPSY Left 2014   benign  . BREAST LUMPECTOMY WITH NEEDLE LOCALIZATION Left 09/09/2013   Procedure: BREAST LUMPECTOMY WITH NEEDLE LOCALIZATION;  Surgeon: Imogene Burn. Georgette Dover, MD;  Location: [facility];  Service: General;  Laterality: Left;  . COLONOSCOPY    . POLYPECTOMY      Family History  Problem Relation Age of Onset  . Diabetes Mother   . Hypertension Mother   . Colon polyps Mother 20  . Dementia Mother 30  . Diabetes Father   . Congestive Heart Failure Father   . Pancreatic cancer Paternal Aunt   . Colon cancer Neg Hx   . Breast cancer Neg Hx   . Lung cancer Neg Hx   . Esophageal cancer Neg Hx   . Rectal cancer Neg Hx   . Stomach cancer Neg Hx     Social History   Socioeconomic History  . Marital status: Widowed    Spouse name: Not on file  . Number of children: Not on file  . Years of education: Not on file  . Highest education level: Not on file  Occupational History  . Not on file  Social Needs  . Financial resource strain: Not on file  . Food insecurity    Worry: Not on file    Inability: Not on file  . Transportation needs    Medical: Not on file    Non-medical: Not on file  Tobacco Use  . Smoking status: Former Research scientist (life sciences)  .  Smokeless tobacco: Never Used  . Tobacco comment: few puffs but not a true smoker   Substance and Sexual Activity  . Alcohol use: Never    Frequency: Never  . Drug use: Never  . Sexual activity: Not on file  Lifestyle  . Physical activity    Days per week: Not on file    Minutes per session: Not on file  . Stress: Not on file  Relationships  . Social Herbalist on phone: Not on file    Gets together: Not on file    Attends religious service: Not on file    Active member of club or organization: Not on file    Attends meetings of clubs or organizations: Not on file    Relationship status: Not on file  Other Topics Concern  . 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 ALOE VERA PO Take by mouth. 2 tbsp prn    . APPLE CIDER VINEGAR PO Take by mouth. 1 tsp daily    . EPINEPHrine (EPIPEN 2-PAK) 0.3 mg/0.3 mL IJ SOAJ injection Inject 0.3 mLs (0.3 mg total) into the muscle as needed for anaphylaxis. 1 Device 1  . HYDROcodone-acetaminophen (NORCO) 5-325 MG tablet Take 1 tablet by mouth every 4 (four) hours as needed for moderate pain. 10 tablet 0  . olmesartan (BENICAR) 5 MG tablet Take 1 tablet (5 mg total) by mouth daily. 90 tablet 1  . ondansetron (ZOFRAN) 4 MG tablet Take 1 tablet (4 mg total) by mouth every 6 (six) hours as needed for nausea. 12 tablet 0  . potassium chloride (K-DUR) 10 MEQ tablet     . tiZANidine (ZANAFLEX) 4 MG tablet Take 1 tablet (4 mg total) by mouth every 8 (eight) hours as needed for muscle spasms. 30 tablet 0   No current facility-administered medications on file prior to visit.     Review of Systems: a complete, 10pt review of systems was completed with pertinent positives and negatives as documented in the HPI  Physical Exam: Vitals Emeline Gins CMA; 08/07/2019 2:41 PM) 08/07/2019 2:40 PM Weight: 213.2 lb Height: 64in Body Surface Area: 2.01 m Body Mass Index: 36.6 kg/m  Temp.: 98.73F  Pulse: 130 (Regular)  BP: 146/90 (Sitting, Left Arm, Standard)  Gen: alert and well appearing Eye: extraocular motion intact, no scleral icterus ENT: moist mucus membranes, dentition intact Neck: no mass or thyromegaly Chest: unlabored respirations, symmetrical air entry, clear bilaterally CV: regular rate and rhythm, no pedal edema Abdomen: soft, nontender, nondistended. No mass or organomegaly MSK: strength symmetrical throughout, no deformity Neuro: grossly intact, normal gait Psych: normal mood and affect, appropriate insight Skin:  warm and dry, no rash or lesion on limited exam   CBC Latest Ref Rng & Units 07/18/2019 02/11/2019 02/07/2019  WBC 4.0 - 10.5 K/uL 9.1 4.8 5.0  Hemoglobin 12.0 - 15.0 g/dL 12.8 12.9 12.6  Hematocrit 36.0 - 46.0 % 40.0 40.8 39.8  Platelets 150 - 400 K/uL 243 250 242    CMP Latest Ref Rng & Units 07/18/2019 05/06/2019 02/13/2019  Glucose 70 - 99 mg/dL 129(H) 98 87  BUN 8 - 23 mg/dL 10 9 12   Creatinine 0.44 - 1.00  mg/dL 0.78 0.90 1.04  Sodium 135 - 145 mmol/L 135 138 138  Potassium 3.5 - 5.1 mmol/L 3.5 4.0 4.1  Chloride 98 - 111 mmol/L 100 102 100  CO2 22 - 32 mmol/L 23 26 29   Calcium 8.9 - 10.3 mg/dL 8.9 8.7 9.3  Total Protein 6.5 - 8.1 g/dL 7.5 - -  Total Bilirubin 0.3 - 1.2 mg/dL 0.8 - -  Alkaline Phos 38 - 126 U/L 82 - -  AST 15 - 41 U/L 21 - -  ALT 0 - 44 U/L 15 - -    Lab Results  Component Value Date   INR 0.98 02/07/2019   INR 1.01 08/23/2018   INR 1.00 02/24/2018    Imaging: No results found.   A/P: BILIARY COLIC (H41.93) Story: Discussed the anatomy of the biliary tract, pathophysiology of gallstones and the technique of laparoscopic cholecystectomy using a diagram to demonstrate. Given the symptoms she experienced this likely represents an attack of biliary colic versus transient cholecystitis. I recommend proceeding with robotic or laparoscopic cholecystectomy with possible cholangiogram. Discussed risks of surgery including bleeding, pain, scarring, intraabdominal injury specifically to the common bile duct and sequelae, bile leak, conversion to open surgery, failure to resolve symptoms, blood clots/ pulmonary embolus, heart attack, pneumonia, stroke, death. Questions welcomed and answered to patient's satisfaction. We also discussed the option of nonoperative management and low-fat diet with risks of recurrent symptoms, cholecystitis, cholangitis or pancreatitis and signs and symptoms that should prompt her to seek emergency treatment.  She would like to proceed with  surgery.   Romana Juniper, MD [facility], [place] Pager [phone]

## 2019-08-20 ENCOUNTER — Ambulatory Visit (INDEPENDENT_AMBULATORY_CARE_PROVIDER_SITE_OTHER): Payer: Medicare Other | Admitting: Family Medicine

## 2019-08-20 ENCOUNTER — Encounter: Payer: Self-pay | Admitting: Family Medicine

## 2019-08-20 ENCOUNTER — Other Ambulatory Visit (HOSPITAL_COMMUNITY)
Admission: RE | Admit: 2019-08-20 | Discharge: 2019-08-20 | Disposition: A | Discharge status: Home / Self Care | Payer: Medicare Other | Source: Ambulatory Visit | Attending: Family Medicine | Admitting: Family Medicine

## 2019-08-20 VITALS — BP 150/90 | HR 80 | Temp 98.3°F | Ht 64.0 in | Wt 213.4 lb

## 2019-08-20 DIAGNOSIS — R1032 Left lower quadrant pain: Secondary | ICD-10-CM

## 2019-08-20 DIAGNOSIS — R1031 Right lower quadrant pain: Secondary | ICD-10-CM

## 2019-08-20 DIAGNOSIS — N898 Other specified noninflammatory disorders of vagina: Secondary | ICD-10-CM | POA: Diagnosis not present

## 2019-08-20 DIAGNOSIS — I10 Essential (primary) hypertension: Secondary | ICD-10-CM

## 2019-08-20 DIAGNOSIS — D219 Benign neoplasm of connective and other soft tissue, unspecified: Secondary | ICD-10-CM

## 2019-08-20 NOTE — Progress Notes (Signed)
Tina Stone is a 69 y.o. female  Chief Complaint  Patient presents with  . Vaginal Discharge    pink vaginal discharge/ itching/ burning/ 3 weeks     HPI: Tina Stone is a 69 y.o. female complains of 3-4 wk h/o vaginal discharge that she notes when she wipes. Discharge is pink in color. Discharge is intermittent and not always present.. She also had 1 day of white discharge. No fever, chills. No spotting or bleeding. She notes 1-2 episodes of external itching and burning in the past 3-4 weeks.  She also endorses "mild" lower abdominal cramping x 1 week. No n/v/d/c.  [facility]. She states her urine was negative and vaginal swab was negative for yeast and BV. They also did STD testing.   She does not wear pads or panty liners regularly. Denies dysuria, urgency, frequency, gross hematuria.  Last PAP - 2-3 years ago - normal/negative as per pt. She does not have GYN.  She notes a h/o fibroids. Pt is not sexually active x years.   Pt also wants to know if she can go back on her BP med benicar 5mg  daily since recent BP readings have been elevated. She stopped med previously after a few days d/t perceived side effect. She is now not certain symtpoms were due to med.   Past Medical History:  Diagnosis Date  . Adenomatous colon polyp   . Allergic rhinitis, seasonal   . Allergy   . Beta thalassemia trait 11/25/2013  . Cholelithiasis   . Class 3 obesity without serious comorbidity with body mass index (BMI) of 40.0 to 44.9 in adult 11/19/2012  . GERD (gastroesophageal reflux disease)   . HLD (hyperlipidemia)   . Hypercholesterolemia   . Hypertension 03/18/2017   no meds   . Intraductal papilloma of left breast    Patient underwent left needle-localized lumpectomy by Dr. Imogene Burn. Tsuei on 09/09/2013; pathology showed intraductal papilloma with no atypia or malignancy identified.  Marland Kitchen PONV (postoperative nausea and vomiting)   .  Prediabetes 03/18/2017   History of elevated glucose with A1c of 5.8% in 2013. This is consistent with obesity and possible metabolic syndrome. Plan to check hemoglobin A1c today and annually.  Marland Kitchen Uterine fibroid     Past Surgical History:  Procedure Laterality Date  . [place]   right  . BREAST EXCISIONAL BIOPSY Left 2014   benign  . BREAST LUMPECTOMY WITH NEEDLE LOCALIZATION Left 09/09/2013   Procedure: BREAST LUMPECTOMY WITH NEEDLE LOCALIZATION;  Surgeon: Imogene Burn. Georgette Dover, MD;  Location: [facility];  Service: General;  Laterality: Left;  . COLONOSCOPY    . POLYPECTOMY      Social History   Socioeconomic History  . Marital status: Widowed    Spouse name: Not on file  . Number of children: Not on file  . Years of education: Not on file  . Highest education level: Not on file  Occupational History  . Not on file  Social Needs  . Financial resource strain: Not on file  . Food insecurity    Worry: Not on file    Inability: Not on file  . Transportation needs    Medical: Not on file    Non-medical: Not on file  Tobacco Use  . Smoking status: Former Research scientist (life sciences)  . Smokeless tobacco: Never Used  . Tobacco comment: few puffs but not a true smoker   Substance and Sexual Activity  .  Alcohol use: Never    Frequency: Never  . Drug use: Never  . Sexual activity: Not on file  Lifestyle  . Physical activity    Days per week: Not on file    Minutes per session: Not on file  . Stress: Not on file  Relationships  . Social Herbalist on phone: Not on file    Gets together: Not on file    Attends religious service: Not on file    Active member of club or organization: Not on file    Attends meetings of clubs or organizations: Not on file    Relationship status: Not on file  . Intimate partner violence    Fear of current or ex partner: Not on file    Emotionally abused: Not on file    Physically abused: Not on file    Forced sexual activity: Not on file   Other Topics Concern  . 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 Diabetes Mother   . Hypertension Mother   . Colon polyps Mother 78  . Dementia Mother 36  . Diabetes Father   . Congestive Heart Failure Father   . Pancreatic cancer Paternal Aunt   . Colon cancer Neg Hx   . Breast cancer Neg Hx   . Lung cancer Neg Hx   . Esophageal cancer Neg Hx   . Rectal cancer Neg Hx   . Stomach cancer Neg Hx      Immunization History  Administered Date(s) Administered  . Influenza Split 10/25/2011, 10/22/2012  . Influenza Whole 09/14/2010  . Influenza,inj,Quad PF,6+ Mos 10/14/2013, 10/14/2014, 10/12/2015, 10/01/2016, 09/16/2017, 10/13/2018  . Pneumococcal Conjugate-13 05/12/2018  . Tdap 08/04/2013    Outpatient Encounter Medications as of 08/20/2019  Medication Sig  . ALOE VERA PO Take by mouth. 2 tbsp prn  . APPLE CIDER VINEGAR PO Take by mouth. 1 tsp daily  . EPINEPHrine (EPIPEN 2-PAK) 0.3 mg/0.3 mL IJ SOAJ injection Inject 0.3 mLs (0.3 mg total) into the muscle as needed for anaphylaxis.  Marland Kitchen HYDROcodone-acetaminophen (NORCO) 5-325 MG tablet Take 1 tablet by mouth every 4 (four) hours as needed for moderate pain.  Marland Kitchen ondansetron (ZOFRAN) 4 MG tablet Take 1 tablet (4 mg total) by mouth every 6 (six) hours as needed for nausea.  . potassium chloride (K-DUR) 10 MEQ tablet   . tiZANidine (ZANAFLEX) 4 MG tablet Take 1 tablet (4 mg total) by mouth every 8 (eight) hours as needed for muscle spasms.  . [DISCONTINUED] olmesartan (BENICAR) 5 MG tablet Take 1 tablet (5 mg total) by mouth daily. (Patient not taking: Reported on 08/20/2019)   No facility-administered encounter medications on file as of 08/20/2019.      ROS: Pertinent positives and negatives noted in HPI. Remainder of ROS non-contributory    Allergies   Allergen Reactions  . Benicar Hct [Olmesartan Medoxomil-Hctz] Shortness Of Breath and Palpitations  . Bactrim [Sulfamethoxazole-Trimethoprim] Other (See Comments)    Abdominal pain, dizziness  . Pravastatin Other (See Comments)    Lower abdominal pain  . Shellfish Allergy Nausea And Vomiting  . Amlodipine Palpitations    BP (!) 150/90   Pulse 80   Temp 98.3 F (36.8 C) (Oral)  Ht 5\' 4"  (1.626 m)   Wt 213 lb 6.4 oz (96.8 kg)   SpO2 98%   BMI 36.63 kg/m   BP Readings from Last 3 Encounters:  08/20/19 (!) 150/90  07/27/19 136/84  07/19/19 (!) 158/78    Physical Exam  Constitutional: She appears well-developed and well-nourished. No distress.  Abdominal: Soft. Bowel sounds are normal. She exhibits no distension and no mass. There is no abdominal tenderness. There is no rebound and no guarding.  Genitourinary:    Vagina and uterus normal.  There is no rash, tenderness or lesion on the right labia. There is no rash, tenderness or lesion on the left labia. Cervix exhibits no motion tenderness and no friability. Right adnexum displays no mass, no tenderness and no fullness. Left adnexum displays no mass, no tenderness and no fullness.     A/P:  1. Vaginal discharge 2. Bilateral lower abdominal cramping 3. Fibroids - Cervicovaginal ancillary only( [place]) - will r/o BV, yeast infection - pt unable to give urine sample and [facility] - PAP UTD, no h/o abnormal PAP - symptoms are intermittent/sporadic so doubt infection - if swab is negative, would consider pelvic US +/- referral to GYN  4. Essential hypertension - pt would like to restart benicar 5mg  daily, as recent BP readings have been elevated and pt is no longer certain she had side effects with this med previously. She will restart, check BP at home and keep log, and f/u with PCP in 2 wks   I personally spent 25 min with the patient today and greater than 50% was spent in counseling,  coordination of care, education

## 2019-08-24 ENCOUNTER — Telehealth: Payer: Self-pay | Admitting: Family Medicine

## 2019-08-24 NOTE — Telephone Encounter (Signed)
Checking on it with Levada Dy

## 2019-08-24 NOTE — Telephone Encounter (Signed)
Pt had pap done with Dr. Loletha Grayer on 08/20/2019, pt is checking on test result.    Copied from [place]. Topic: General - Other >> Aug 24, 2019  2:02 PM Rainey Pines A wrote: Patient would like a callback from Dr. Zigmund Daniel nurse in regards to exam results being sent over from her appt on 08/20/2019.

## 2019-08-24 NOTE — Telephone Encounter (Signed)
LVM for the pt to call back, need to inform the pt that they are working on the result. [place] for triage to give detail message.

## 2019-08-25 ENCOUNTER — Telehealth: Payer: Self-pay | Admitting: *Deleted

## 2019-08-25 LAB — CERVICOVAGINAL ANCILLARY ONLY
Bacterial vaginitis: NEGATIVE
Candida vaginitis: NEGATIVE
Chlamydia: NEGATIVE
Neisseria Gonorrhea: NEGATIVE
Trichomonas: NEGATIVE

## 2019-08-25 NOTE — Telephone Encounter (Signed)
Dr.C please advise

## 2019-08-25 NOTE — Telephone Encounter (Signed)
Patient called to get her pap smear result-  Patient notified of negative test for yeast,BV, and any associated STD. Explained per last pap/letter that she has aged out of routine pap smears and that she is not required to have them. Patient states she is not sexually active and menopausal with history of fibroids.She is still having pink tinged/light blood tinged discharge 1-2 times during the week usually seen when wiping.Advsied patient I would notify PCP for follow up to her symptom for next steps. Patient will expect a call back with plan.

## 2019-08-26 ENCOUNTER — Encounter: Payer: Self-pay | Admitting: Family Medicine

## 2019-08-26 NOTE — Telephone Encounter (Signed)
I sent pt a mychart message re: normal results. I would recommend f/u with GYN since symptoms, although intermittent, are persisting

## 2019-08-27 ENCOUNTER — Telehealth: Payer: Self-pay

## 2019-08-27 DIAGNOSIS — N898 Other specified noninflammatory disorders of vagina: Secondary | ICD-10-CM

## 2019-08-27 NOTE — Telephone Encounter (Signed)
Dr. Loletha Grayer please advise can we add urgent referral to GYN?  Copied from [place] [id number]. Topic: Referral - Request for Referral >> Aug 27, 2019 11:02 AM Tina Stone wrote: Pt was advised to see GYN for her ongoing issues, but pt does not have one.  Needs urgent referral, as she is having surgery 09/09/19. Pt request can this referral be done today?

## 2019-08-27 NOTE — Telephone Encounter (Signed)
Gave pt notes below per Dr. Loletha Grayer.

## 2019-08-27 NOTE — Telephone Encounter (Signed)
I will place GYN referral to [facility] [place],  [place]  [id number]  [phone]  Pt can call later this afternoon or tomorrow AM to schedule appt.   Pt is scheduled to cholecystectomy on 09/09/19. I do not think her current symptoms (intermittent light pink vaginal discharge when wiping) would be reason to postpone the planned surgery.

## 2019-08-31 ENCOUNTER — Telehealth: Payer: Self-pay | Admitting: Gastroenterology

## 2019-08-31 NOTE — Telephone Encounter (Signed)
Patient contacted. She was reporting this information because she was not certain if this was something we needed to know. Recall colonoscopy is 2024.

## 2019-08-31 NOTE — Telephone Encounter (Signed)
Pt is scheduled for gallbladder surgery 09/09/19 at [facility] with Dr. Windle Guard and requested to use propofol as the anesthesia.

## 2019-09-01 ENCOUNTER — Other Ambulatory Visit: Payer: Self-pay

## 2019-09-01 ENCOUNTER — Encounter: Payer: Self-pay | Admitting: Obstetrics and Gynecology

## 2019-09-01 ENCOUNTER — Ambulatory Visit (INDEPENDENT_AMBULATORY_CARE_PROVIDER_SITE_OTHER): Payer: Medicare Other | Admitting: Obstetrics and Gynecology

## 2019-09-01 VITALS — BP 131/80 | HR 99 | Ht 64.0 in | Wt 211.1 lb

## 2019-09-01 DIAGNOSIS — N950 Postmenopausal bleeding: Secondary | ICD-10-CM

## 2019-09-01 DIAGNOSIS — Z01419 Encounter for gynecological examination (general) (routine) without abnormal findings: Secondary | ICD-10-CM | POA: Diagnosis not present

## 2019-09-01 NOTE — Progress Notes (Signed)
Pt states that she has been experiencing pinkish vaginal discharge x 2 weeks.

## 2019-09-01 NOTE — Progress Notes (Signed)
GYNECOLOGY ENCOUNTER NOTE  History:     Tina Stone is a 69 y.o. G0P0000 female here with post menopausal vaginal bleeding.   She complains of vaginal spotting x 3 weeks, off and on. She is post menopausal. Last menstrual cycle was age 63. Has been seen by [facility] and her PCP in the last week; swabs collected at both and negative. She was referred to GYN. No pain.  Hx of uterine fibroids. No history of abnormal pap smears. Last pap was 2018.  Gynecologic History No LMP recorded. Patient is postmenopausal. Last Pap: 2018. Results were: normal with negative HPV Last mammogram: 2020. Results were: normal  Obstetric History OB History  Gravida Para Term Preterm AB Living  0 0 0 0 0 0  SAB TAB Ectopic Multiple Live Births  0 0 0 0 0    Past Medical History:  Diagnosis Date  . Adenomatous colon polyp   . Allergic rhinitis, seasonal   . Allergy   . Beta thalassemia trait 11/25/2013  . Cholelithiasis   . Class 3 obesity without serious comorbidity with body mass index (BMI) of 40.0 to 44.9 in adult 11/19/2012  . GERD (gastroesophageal reflux disease)   . HLD (hyperlipidemia)   . Hypercholesterolemia   . Hypertension 03/18/2017   no meds   . Intraductal papilloma of left breast    Patient underwent left needle-localized lumpectomy by Dr. Imogene Burn. Tsuei on 09/09/2013; pathology showed intraductal papilloma with no atypia or malignancy identified.  Marland Kitchen PONV (postoperative nausea and vomiting)   . Prediabetes 03/18/2017   History of elevated glucose with A1c of 5.8% in 2013. This is consistent with obesity and possible metabolic syndrome. Plan to check hemoglobin A1c today and annually.  Marland Kitchen Uterine fibroid     Past Surgical History:  Procedure Laterality Date  . [place]   right  . BREAST EXCISIONAL BIOPSY Left 2014   benign  . BREAST LUMPECTOMY WITH NEEDLE LOCALIZATION Left 09/09/2013   Procedure: BREAST LUMPECTOMY WITH NEEDLE LOCALIZATION;   Surgeon: Imogene Burn. Georgette Dover, MD;  Location: [facility];  Service: General;  Laterality: Left;  . COLONOSCOPY    . POLYPECTOMY      Current Outpatient Medications on File Prior to Visit  Medication Sig Dispense Refill  . APPLE CIDER VINEGAR PO Take 5 mLs by mouth daily.     Marland Kitchen EPINEPHrine (EPIPEN 2-PAK) 0.3 mg/0.3 mL IJ SOAJ injection Inject 0.3 mLs (0.3 mg total) into the muscle as needed for anaphylaxis. 1 Device 1  . Multiple Vitamin (MULTIVITAMIN WITH MINERALS) TABS tablet Take 1 tablet by mouth daily.    Marland Kitchen olmesartan (BENICAR) 5 MG tablet Take 5 mg by mouth daily.    Marland Kitchen HYDROcodone-acetaminophen (NORCO) 5-325 MG tablet Take 1 tablet by mouth every 4 (four) hours as needed for moderate pain. (Patient not taking: Reported on 09/01/2019) 10 tablet 0  . ondansetron (ZOFRAN) 4 MG tablet Take 1 tablet (4 mg total) by mouth every 6 (six) hours as needed for nausea. (Patient not taking: Reported on 09/01/2019) 12 tablet 0  . tiZANidine (ZANAFLEX) 4 MG tablet Take 1 tablet (4 mg total) by mouth every 8 (eight) hours as needed for muscle spasms. (Patient not taking: Reported on 09/01/2019) 30 tablet 0   No current facility-administered medications on file prior to visit.     Allergies  Allergen Reactions  . Benicar Hct [Olmesartan Medoxomil-Hctz] Shortness Of Breath and Palpitations  . Bactrim [Sulfamethoxazole-Trimethoprim] Other (See Comments)  Abdominal pain, dizziness  . Pravastatin Other (See Comments)    Lower abdominal pain  . Shellfish Allergy Nausea And Vomiting  . Amlodipine Palpitations    Social History:  reports that she has quit smoking. She has never used smokeless tobacco. She reports that she does not drink alcohol or use drugs.  Family History  Problem Relation Age of Onset  . Diabetes Mother   . Hypertension Mother   . Colon polyps Mother 61  . Dementia Mother 107  . Diabetes Father   . Congestive Heart Failure Father   . Pancreatic cancer Paternal Aunt   . Colon cancer Neg Hx    . Breast cancer Neg Hx   . Lung cancer Neg Hx   . Esophageal cancer Neg Hx   . Rectal cancer Neg Hx   . Stomach cancer Neg Hx     The following portions of the patient's history were reviewed and updated as appropriate: allergies, current medications, past family history, past medical history, past social history, past surgical history and problem list.  Review of Systems Pertinent items noted in HPI and remainder of comprehensive ROS otherwise negative.  Physical Exam:  BP 131/80   Pulse 99   Ht 5\' 4"  (1.626 m)   Wt 211 lb 1.3 oz (95.7 kg)   BMI 36.23 kg/m  CONSTITUTIONAL: Well-developed, well-nourished female in no acute distress.  HENT:  Normocephalic, atraumatic,  EYES: Conjunctivae and EOM are normal. Pupils are equal, round, and reactive to light. No scleral icterus.  NECK: Normal range of motion, supple, no masses. SKIN: Skin is warm and dry. No rash noted. Not diaphoretic. No erythema. No pallor. MUSCULOSKELETAL: Normal range of motion. No tenderness.  No cyanosis, clubbing, or edema.  2+ distal pulses. NEUROLOGIC: Alert and oriented to person, place, and time. Normal reflexes, muscle tone coordination. No cranial nerve deficit noted. PSYCHIATRIC: Normal mood and affect. Normal behavior. Normal judgment and thought content. CARDIOVASCULAR: Normal heart rate noted, regular rhythm RESPIRATORY: Clear to auscultation bilaterally. Effort and breath sounds normal, no problems with respiration noted. BREASTS: Symmetric in size. No masses, skin changes, nipple drainage, or lymphadenopathy. ABDOMEN: Soft, normal bowel sounds, no distention noted.  No tenderness, rebound or guarding.  PELVIC: Normal appearing external genitalia. No blood noted in the vaginal canal. Cervical changes noted with surface of cervix that bled easily when touched with a fox swab. No abnormal discharge noted.  .  Normal uterine size, no other palpable masses, no uterine or adnexal tenderness.   Assessment  and Plan:   1. Postmenopausal bleeding  - Needs to see MD for endometrial biopsy. Patient is having gall bladder surgery and will need to schedule this visit after surgery.  - STI Swabs negative collected with PCP   Jeannemarie Sawaya, Artist Pais, [place] for [employer], [place]

## 2019-09-02 ENCOUNTER — Ambulatory Visit: Payer: Medicare Other | Admitting: Family Medicine

## 2019-09-02 DIAGNOSIS — N950 Postmenopausal bleeding: Secondary | ICD-10-CM | POA: Insufficient documentation

## 2019-09-03 ENCOUNTER — Encounter (HOSPITAL_COMMUNITY): Admission: RE | Admit: 2019-09-03 | Payer: Medicare Other | Source: Ambulatory Visit

## 2019-09-03 ENCOUNTER — Telehealth: Payer: Self-pay

## 2019-09-03 NOTE — Telephone Encounter (Signed)
Questions for Screening COVID-19  Symptom onset:no  Travel or Contacts:no  During this illness, did/does the patient experience any of the following symptoms? Fever >100.28F []   Yes [x]   No []   Unknown Subjective fever (felt feverish) []   Yes [x]   No []   Unknown Chills []   Yes [x]   No []   Unknown Muscle aches (myalgia) []   Yes [x]   No []   Unknown Runny nose (rhinorrhea) []   Yes [x]   No []   Unknown Sore throat []   Yes [x]   No []   Unknown Cough (new onset or worsening of chronic cough) []   Yes [x]   No []   Unknown Shortness of breath (dyspnea) []   Yes [x]   No []   Unknown Nausea or vomiting []   Yes [x]   No []   Unknown Headache []   Yes [x]   No []   Unknown Abdominal pain  []   Yes [x]   No []   Unknown Diarrhea (?3 loose/looser than normal stools/24hr period) []   Yes [x]   No []   Unknown Other, specify:  Patient risk factors: Smoker? []   Current []   Former []   Never If female, currently pregnant? []   Yes []   No  Patient Active Problem List   Diagnosis Date Noted  . Postmenopausal bleeding 09/02/2019  . Biliary colic [id number]  . Breast pain, left 05/20/2019  . Trapezius muscle spasm 05/11/2019  . Elevated random blood glucose level 02/13/2019  . Epigastric pain 01/29/2019  . Prediabetes 03/18/2017  . Hypertension 03/18/2017  . Beta thalassemia trait 11/25/2013  . Obesity 11/19/2012  . Women's annual routine gynecological examination 07/12/2011  . Gastroesophageal reflux disease 01/28/2007  . Hyperlipidemia 10/10/2006    Plan:  []   High risk for COVID-19 with red flags go to ED (with CP, SOB, weak/lightheaded, or fever > 101.5). Call ahead.  []   High risk for COVID-19 but stable. Inform provider and coordinate time for [facility] visit.   []   No red flags but URI signs or symptoms okay for [facility] visit.

## 2019-09-04 ENCOUNTER — Other Ambulatory Visit: Payer: Self-pay

## 2019-09-04 ENCOUNTER — Ambulatory Visit (INDEPENDENT_AMBULATORY_CARE_PROVIDER_SITE_OTHER): Payer: Medicare Other | Admitting: Family Medicine

## 2019-09-04 ENCOUNTER — Encounter: Payer: Self-pay | Admitting: Family Medicine

## 2019-09-04 DIAGNOSIS — K8050 Calculus of bile duct without cholangitis or cholecystitis without obstruction: Secondary | ICD-10-CM | POA: Diagnosis not present

## 2019-09-04 DIAGNOSIS — I10 Essential (primary) hypertension: Secondary | ICD-10-CM | POA: Diagnosis not present

## 2019-09-04 MED ORDER — CHLORTHALIDONE 25 MG PO TABS: 12.5000 mg | ORAL_TABLET | Freq: Every day | ORAL | 1 refills | Status: DC

## 2019-09-04 MED FILL — CHLORTHALIDONE 25 MG TABS: 25 | 60 days supply | Qty: 30 | Fill #0

## 2019-09-04 NOTE — Patient Instructions (Addendum)
DUE TO COVID-19 ONLY ONE VISITOR IS ALLOWED TO COME WITH YOU AND STAY IN THE WAITING ROOM ONLY DURING PRE OP AND PROCEDURE DAY OF SURGERY. THE 1 VISITOR MAY VISIT WITH YOU AFTER SURGERY IN YOUR PRIVATE ROOM DURING VISITING HOURS ONLY!  YOU HAD, A COVID TEST ON 09-05-2019. ONCE YOUR COVID TEST IS COMPLETED, PLEASE BEGIN THE QUARANTINE INSTRUCTIONS AS OUTLINED IN YOUR HANDOUT.                Tina Stone    Your procedure is scheduled on: 09-09-2019   Report to [facility] Main  Entrance   Report to admitting at 900     Call this number if you have problems the morning of surgery [phone]    Remember: Do not eat food or drink liquids :After Midnight. BRUSH YOUR TEETH MORNING OF SURGERY AND RINSE YOUR MOUTH OUT, NO CHEWING GUM CANDY OR MINTS.     Take these medicines the morning of surgery with A SIP OF WATER: NONE                                You may not have any metal on your body including hair pins and              piercings  Do not wear jewelry, make-up, lotions, powders or perfumes, deodorant             Do not wear nail polish.  Do not shave  48 hours prior to surgery.               Do not bring valuables to the hospital. [place].  Contacts, dentures or bridgework may not be worn into surgery.  Leave suitcase in the car. After surgery it may be brought to your room.     Patients discharged the day of surgery will not be allowed to drive home. IF YOU ARE HAVING SURGERY AND GOING HOME THE SAME DAY, YOU MUST HAVE AN ADULT TO DRIVE YOU HOME AND BE WITH YOU FOR 24 HOURS. YOU MAY GO HOME BY TAXI OR UBER OR ORTHERWISE, BUT AN ADULT MUST ACCOMPANY YOU HOME AND STAY WITH YOU FOR 24 HOURS.  Name and phone number of your driver: COUSIN Tina Stone [phone]  Special Instructions: N/A              Please read over the following fact sheets you were  given: _____________________________________________________________________             [facility] - Preparing for Surgery Before surgery, you can play an important role.  Because skin is not sterile, your skin needs to be as free of germs as possible.  You can reduce the number of germs on your skin by washing with CHG (chlorahexidine gluconate) soap before surgery.  CHG is an antiseptic cleaner which kills germs and bonds with the skin to continue killing germs even after washing. Please DO NOT use if you have an allergy to CHG or antibacterial soaps.  If your skin becomes reddened/irritated stop using the CHG and inform your nurse when you arrive at Short Stay. Do not shave (including legs and underarms) for at least 48 hours prior to the first CHG shower.  You may shave your face/neck. Please follow these instructions carefully:  1.  Shower with  CHG Soap the night before surgery and the  morning of Surgery.  2.  If you choose to wash your hair, wash your hair first as usual with your  normal  shampoo.  3.  After you shampoo, rinse your hair and body thoroughly to remove the  shampoo.                           4.  Use CHG as you would any other liquid soap.  You can apply chg directly  to the skin and wash                       Gently with a scrungie or clean washcloth.  5.  Apply the CHG Soap to your body ONLY FROM THE NECK DOWN.   Do not use on face/ open                           Wound or open sores. Avoid contact with eyes, ears mouth and genitals (private parts).                       Wash face,  Genitals (private parts) with your normal soap.             6.  Wash thoroughly, paying special attention to the area where your surgery  will be performed.  7.  Thoroughly rinse your body with warm water from the neck down.  8.  DO NOT shower/wash with your normal soap after using and rinsing off  the CHG Soap.                9.  Pat yourself dry with a clean towel.            10.  Wear  clean pajamas.            11.  Place clean sheets on your bed the night of your first shower and do not  sleep with pets. Day of Surgery : Do not apply any lotions/deodorants the morning of surgery.  Please wear clean clothes to the hospital/surgery center.  FAILURE TO FOLLOW THESE INSTRUCTIONS MAY RESULT IN THE CANCELLATION OF YOUR SURGERY PATIENT SIGNATURE_________________________________  NURSE SIGNATURE__________________________________  ________________________________________________________________________

## 2019-09-04 NOTE — Assessment & Plan Note (Signed)
-  BP is uncontrolled at this time.  -She believes she has been having side effects related to olmesartan.  -Recommend trying chlorthalidone. She has f/u on 9/15, we'll recheck her BP at that time and repeat a BMP.  -Continue to cut back on sodium intake.

## 2019-09-04 NOTE — Progress Notes (Signed)
Tina Stone - 69 y.o. female MRN [id number]  Date of birth: 04-02-1950  Subjective Chief Complaint  Patient presents with  . Follow-up    follow up BP/ pt states stopped taking BP meds couldn't deal with side effects/     HPI Tina Stone is a 69 y.o. female here today for follow up of HTN.  She reports that she has discontinued benicar again.  She had restarted x 2 weeks but reports that she began to have muscle and joint aching.  She is scheduled to have gallbladder removal next week and would like to get BP under control prior to surgery so it doesn't get pushed out.  She denies any symptoms related to HTN including headache ,vision change, chest pain or shortness of breath.  She has tried amlodipine in the past and had palpitations while taking this.  She is willing to try other classes of medications.  She has worked on cutting back on salt intake.   ROS:  A comprehensive ROS was completed and negative except as noted per HPI  Allergies  Allergen Reactions  . Benicar Hct [Olmesartan Medoxomil-Hctz] Shortness Of Breath and Palpitations  . Bactrim [Sulfamethoxazole-Trimethoprim] Other (See Comments)    Abdominal pain, dizziness  . Pravastatin Other (See Comments)    Lower abdominal pain  . Shellfish Allergy Nausea And Vomiting  . Amlodipine Palpitations    Past Medical History:  Diagnosis Date  . Adenomatous colon polyp   . Allergic rhinitis, seasonal   . Allergy   . Beta thalassemia trait 11/25/2013  . Cholelithiasis   . Class 3 obesity without serious comorbidity with body mass index (BMI) of 40.0 to 44.9 in adult 11/19/2012  . GERD (gastroesophageal reflux disease)   . HLD (hyperlipidemia)   . Hypercholesterolemia   . Hypertension 03/18/2017   no meds   . Intraductal papilloma of left breast    Patient underwent left needle-localized lumpectomy by Dr. Imogene Burn. Tsuei on 09/09/2013; pathology showed intraductal papilloma with no atypia or malignancy  identified.  Marland Kitchen PONV (postoperative nausea and vomiting)   . Prediabetes 03/18/2017   History of elevated glucose with A1c of 5.8% in 2013. This is consistent with obesity and possible metabolic syndrome. Plan to check hemoglobin A1c today and annually.  Marland Kitchen Uterine fibroid     Past Surgical History:  Procedure Laterality Date  . [place]   right  . BREAST EXCISIONAL BIOPSY Left 2014   benign  . BREAST LUMPECTOMY WITH NEEDLE LOCALIZATION Left 09/09/2013   Procedure: BREAST LUMPECTOMY WITH NEEDLE LOCALIZATION;  Surgeon: Imogene Burn. Georgette Dover, MD;  Location: [facility];  Service: General;  Laterality: Left;  . COLONOSCOPY    . POLYPECTOMY      Social History   Socioeconomic History  . Marital status: Widowed    Spouse name: Not on file  . Number of children: Not on file  . Years of education: Not on file  . Highest education level: Not on file  Occupational History  . Not on file  Social Needs  . Financial resource strain: Not on file  . Food insecurity    Worry: Not on file    Inability: Not on file  . Transportation needs    Medical: Not on file    Non-medical: Not on file  Tobacco Use  . Smoking status: Former Research scientist (life sciences)  . Smokeless tobacco: Never Used  . Tobacco comment: few puffs but not a true smoker   Substance and Sexual  Activity  . Alcohol use: Never    Frequency: Never  . Drug use: Never  . Sexual activity: Not on file  Lifestyle  . Physical activity    Days per week: Not on file    Minutes per session: Not on file  . Stress: Not on file  Relationships  . Social Herbalist on phone: Not on file    Gets together: Not on file    Attends religious service: Not on file    Active member of club or organization: Not on file    Attends meetings of clubs or organizations: Not on file    Relationship status: Not on file  Other Topics Concern  . 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 Diabetes Mother   . Hypertension Mother   . Colon polyps Mother 60  . Dementia Mother 70  . Diabetes Father   . Congestive Heart Failure Father   . Pancreatic cancer Paternal Aunt   . Colon cancer Neg Hx   . Breast cancer Neg Hx   . Lung cancer Neg Hx   . Esophageal cancer Neg Hx   . Rectal cancer Neg Hx   . Stomach cancer Neg Hx     Health Maintenance  Topic Date Due  . PNA vac Low Risk Adult (2 of 2 - PPSV23) 05/13/2019  . INFLUENZA VACCINE  08/01/2019  . MAMMOGRAM  07/30/2021  . TETANUS/TDAP  08/05/2023  . COLONOSCOPY  10/07/2023  . DEXA SCAN  Completed  . Hepatitis C Screening  Completed    ----------------------------------------------------------------------------------------------------------------------------------------------------------------------------------------------------------------- Physical Exam BP (!) 152/90   Pulse 81   Temp 98 F (36.7 C) (Oral)   Ht 5\' 4"  (1.626 m)   Wt 215 lb 12.8 oz (97.9 kg)   SpO2 98%   BMI 37.04 kg/m   Physical Exam Constitutional:      Appearance: Normal appearance.  HENT:     Head: Normocephalic and atraumatic.     Mouth/Throat:     Mouth: Mucous membranes are moist.  Eyes:     General: No scleral icterus. Neck:     Musculoskeletal: Neck supple.  Cardiovascular:     Rate and Rhythm: Normal rate and regular rhythm.  Pulmonary:     Effort: Pulmonary effort is normal.     Breath sounds: Normal breath sounds.  Skin:    General: Skin is warm and dry.  Neurological:     General: No focal deficit present.     Mental Status: She is alert.  Psychiatric:        Mood and Affect: Mood normal.      ------------------------------------------------------------------------------------------------------------------------------------------------------------------------------------------------------------------- Assessment and Plan  Hypertension -BP is uncontrolled at this time.  -She believes she has been having side effects related to olmesartan.  -Recommend trying chlorthalidone. She has f/u on 9/15, we'll recheck her BP at that time and repeat a BMP.  -Continue to cut back on sodium intake.    Biliary colic - No recent pain, scheduled for elective cholecystectomy next week.

## 2019-09-04 NOTE — Assessment & Plan Note (Signed)
-   No recent pain, scheduled for elective cholecystectomy next week.

## 2019-09-04 NOTE — Patient Instructions (Signed)

## 2019-09-05 ENCOUNTER — Other Ambulatory Visit (HOSPITAL_COMMUNITY)
Admission: RE | Admit: 2019-09-05 | Discharge: 2019-09-05 | Disposition: A | Discharge status: Home / Self Care | Payer: Medicare Other | Source: Ambulatory Visit | Attending: Surgery | Admitting: Surgery

## 2019-09-05 DIAGNOSIS — Z20828 Contact with and (suspected) exposure to other viral communicable diseases: Secondary | ICD-10-CM | POA: Insufficient documentation

## 2019-09-05 DIAGNOSIS — Z01812 Encounter for preprocedural laboratory examination: Secondary | ICD-10-CM | POA: Insufficient documentation

## 2019-09-06 LAB — NOVEL CORONAVIRUS, NAA (HOSP ORDER, SEND-OUT TO REF LAB; TAT 18-24 HRS): SARS-CoV-2, NAA: NOT DETECTED

## 2019-09-08 ENCOUNTER — Encounter (HOSPITAL_COMMUNITY): Payer: Self-pay

## 2019-09-08 ENCOUNTER — Encounter: Payer: Self-pay | Admitting: Family Medicine

## 2019-09-08 ENCOUNTER — Other Ambulatory Visit: Payer: Self-pay

## 2019-09-08 ENCOUNTER — Encounter (HOSPITAL_COMMUNITY)
Admission: RE | Admit: 2019-09-08 | Discharge: 2019-09-08 | Disposition: A | Discharge status: Home / Self Care | Payer: Medicare Other | Source: Ambulatory Visit | Attending: Surgery | Admitting: Surgery

## 2019-09-08 DIAGNOSIS — I10 Essential (primary) hypertension: Secondary | ICD-10-CM | POA: Diagnosis not present

## 2019-09-08 DIAGNOSIS — K8010 Calculus of gallbladder with chronic cholecystitis without obstruction: Secondary | ICD-10-CM | POA: Diagnosis not present

## 2019-09-08 DIAGNOSIS — K8050 Calculus of bile duct without cholangitis or cholecystitis without obstruction: Secondary | ICD-10-CM | POA: Diagnosis present

## 2019-09-08 DIAGNOSIS — E785 Hyperlipidemia, unspecified: Secondary | ICD-10-CM | POA: Diagnosis not present

## 2019-09-08 DIAGNOSIS — Z87891 Personal history of nicotine dependence: Secondary | ICD-10-CM | POA: Diagnosis not present

## 2019-09-08 HISTORY — DX: Prediabetes: R73.03

## 2019-09-08 LAB — CBC WITH DIFFERENTIAL/PLATELET
Abs Immature Granulocytes: 0.03 10*3/uL (ref 0.00–0.07)
Basophils Absolute: 0 10*3/uL (ref 0.0–0.1)
Basophils Relative: 1 %
Eosinophils Absolute: 0.1 10*3/uL (ref 0.0–0.5)
Eosinophils Relative: 3 %
HCT: 42 % (ref 36.0–46.0)
Hemoglobin: 13.1 g/dL (ref 12.0–15.0)
Immature Granulocytes: 1 %
Lymphocytes Relative: 30 %
Lymphs Abs: 1.2 10*3/uL (ref 0.7–4.0)
MCH: 24.5 pg — ABNORMAL LOW (ref 26.0–34.0)
MCHC: 31.2 g/dL (ref 30.0–36.0)
MCV: 78.5 fL — ABNORMAL LOW (ref 80.0–100.0)
Monocytes Absolute: 0.3 10*3/uL (ref 0.1–1.0)
Monocytes Relative: 7 %
Neutro Abs: 2.3 10*3/uL (ref 1.7–7.7)
Neutrophils Relative %: 58 %
Platelets: 247 10*3/uL (ref 150–400)
RBC: 5.35 MIL/uL — ABNORMAL HIGH (ref 3.87–5.11)
RDW: 15.5 % (ref 11.5–15.5)
WBC: 4 10*3/uL (ref 4.0–10.5)
nRBC: 0 % (ref 0.0–0.2)

## 2019-09-08 LAB — COMPREHENSIVE METABOLIC PANEL
ALT: 12 U/L (ref 0–44)
AST: 19 U/L (ref 15–41)
Albumin: 3.8 g/dL (ref 3.5–5.0)
Alkaline Phosphatase: 79 U/L (ref 38–126)
Anion gap: 13 (ref 5–15)
BUN: 8 mg/dL (ref 8–23)
CO2: 26 mmol/L (ref 22–32)
Calcium: 9.2 mg/dL (ref 8.9–10.3)
Chloride: 99 mmol/L (ref 98–111)
Creatinine, Ser: 0.93 mg/dL (ref 0.44–1.00)
GFR calc Af Amer: >60 mL/min (ref >60)
GFR calc non Af Amer: >60 mL/min (ref >60)
Glucose, Bld: 92 mg/dL (ref 70–99)
Potassium: 3.8 mmol/L (ref 3.5–5.1)
Sodium: 138 mmol/L (ref 135–145)
Total Bilirubin: 0.4 mg/dL (ref 0.3–1.2)
Total Protein: 7.5 g/dL (ref 6.5–8.1)

## 2019-09-08 MED ORDER — BUPIVACAINE LIPOSOME 1.3 % IJ SUSP
20.0000 mL | INTRAMUSCULAR | Status: DC
  Filled 2019-09-08: qty 20

## 2019-09-08 NOTE — Progress Notes (Signed)
PCP - cody matthew Cardiologist - none  Chest x-ray -02-11-19 epic EKG - 03-05-19 epic Stress Test - none ECHO - none Cardiac Cath - none  Sleep Study - none CPAP - none  Fasting Blood Sugar - n/a Checks Blood Sugar _____ times a day  Blood Thinner Instructions:none Aspirin Instructions:none Last Dose:  Anesthesia review:   Patient denies shortness of breath, fever, cough and chest pain at PAT appointment   Patient verbalized understanding of instructions that were given to them at the PAT appointment. Patient was also instructed that they will need to review over the PAT instructions again at home before surgery.

## 2019-09-08 NOTE — Progress Notes (Signed)
HEMAGLOBIN A1C 77-27-20 EPIC

## 2019-09-09 ENCOUNTER — Ambulatory Visit (HOSPITAL_COMMUNITY): Payer: Medicare Other | Admitting: Physician Assistant

## 2019-09-09 ENCOUNTER — Ambulatory Visit (HOSPITAL_COMMUNITY)
Admission: RE | Admit: 2019-09-09 | Discharge: 2019-09-09 | Disposition: A | Discharge status: Home / Self Care | Payer: Medicare Other | Attending: Surgery | Admitting: Surgery

## 2019-09-09 ENCOUNTER — Ambulatory Visit (HOSPITAL_COMMUNITY): Payer: Medicare Other | Admitting: Anesthesiology

## 2019-09-09 ENCOUNTER — Encounter (HOSPITAL_COMMUNITY)
Admission: RE | Disposition: A | Discharge status: Home / Self Care | Payer: Self-pay | Source: Home / Self Care | Attending: Surgery

## 2019-09-09 ENCOUNTER — Encounter (HOSPITAL_COMMUNITY): Payer: Self-pay

## 2019-09-09 DIAGNOSIS — K8050 Calculus of bile duct without cholangitis or cholecystitis without obstruction: Secondary | ICD-10-CM | POA: Diagnosis not present

## 2019-09-09 DIAGNOSIS — E785 Hyperlipidemia, unspecified: Secondary | ICD-10-CM | POA: Diagnosis not present

## 2019-09-09 DIAGNOSIS — Z87891 Personal history of nicotine dependence: Secondary | ICD-10-CM | POA: Insufficient documentation

## 2019-09-09 DIAGNOSIS — I10 Essential (primary) hypertension: Secondary | ICD-10-CM | POA: Diagnosis not present

## 2019-09-09 DIAGNOSIS — K8010 Calculus of gallbladder with chronic cholecystitis without obstruction: Secondary | ICD-10-CM | POA: Diagnosis not present

## 2019-09-09 DIAGNOSIS — R7303 Prediabetes: Secondary | ICD-10-CM | POA: Diagnosis not present

## 2019-09-09 HISTORY — PX: ROBOTIC ASSISTED LAPAROSCOPIC CHOLECYSTECTOMY: SHX6521

## 2019-09-09 SURGERY — CHOLECYSTECTOMY, ROBOT-ASSISTED, LAPAROSCOPIC
Anesthesia: General | Site: Abdomen

## 2019-09-09 MED ORDER — MIDAZOLAM HCL 2 MG/2ML IJ SOLN
INTRAMUSCULAR | Status: AC
  Filled 2019-09-09: qty 2

## 2019-09-09 MED ORDER — OXYCODONE HCL 5 MG PO TABS: 5.0000 mg | ORAL_TABLET | ORAL | Status: DC | PRN

## 2019-09-09 MED ORDER — DEXAMETHASONE SODIUM PHOSPHATE 10 MG/ML IJ SOLN
INTRAMUSCULAR | Status: AC
  Filled 2019-09-09: qty 1

## 2019-09-09 MED ORDER — ONDANSETRON HCL 4 MG/2ML IJ SOLN
INTRAMUSCULAR | Status: DC | PRN
  Administered 2019-09-09: 4 mg via INTRAVENOUS

## 2019-09-09 MED ORDER — PROPOFOL 10 MG/ML IV BOLUS
INTRAVENOUS | Status: AC
  Filled 2019-09-09: qty 20

## 2019-09-09 MED ORDER — FENTANYL CITRATE (PF) 100 MCG/2ML IJ SOLN
25.0000 ug | INTRAMUSCULAR | Status: DC | PRN
  Administered 2019-09-09: 25 ug via INTRAVENOUS

## 2019-09-09 MED ORDER — PROPOFOL 10 MG/ML IV BOLUS
INTRAVENOUS | Status: DC | PRN
  Administered 2019-09-09: 170 mg via INTRAVENOUS

## 2019-09-09 MED ORDER — PROMETHAZINE HCL 25 MG/ML IJ SOLN
6.2500 mg | INTRAMUSCULAR | Status: DC | PRN
  Administered 2019-09-09: 12.5 mg via INTRAVENOUS

## 2019-09-09 MED ORDER — ROCURONIUM BROMIDE 100 MG/10ML IV SOLN
INTRAVENOUS | Status: DC | PRN
  Administered 2019-09-09: 10 mg via INTRAVENOUS
  Administered 2019-09-09: 50 mg via INTRAVENOUS

## 2019-09-09 MED ORDER — LIDOCAINE 2% (20 MG/ML) 5 ML SYRINGE
INTRAMUSCULAR | Status: AC
  Filled 2019-09-09: qty 5

## 2019-09-09 MED ORDER — ACETAMINOPHEN 500 MG PO TABS
1000.0000 mg | ORAL_TABLET | ORAL | Status: AC
  Administered 2019-09-09: 1000 mg via ORAL
  Filled 2019-09-09: qty 2

## 2019-09-09 MED ORDER — TRAMADOL HCL 50 MG PO TABS: 50.0000 mg | ORAL_TABLET | Freq: Four times a day (QID) | ORAL | 0 refills | Status: DC | PRN

## 2019-09-09 MED ORDER — ONDANSETRON HCL 4 MG/2ML IJ SOLN
INTRAMUSCULAR | Status: AC
  Filled 2019-09-09: qty 2

## 2019-09-09 MED ORDER — BUPIVACAINE HCL (PF) 0.25 % IJ SOLN
INTRAMUSCULAR | Status: DC | PRN
  Administered 2019-09-09: 30 mL

## 2019-09-09 MED ORDER — DOCUSATE SODIUM 100 MG PO CAPS: 100.0000 mg | ORAL_CAPSULE | Freq: Two times a day (BID) | ORAL | 0 refills | Status: DC

## 2019-09-09 MED ORDER — FENTANYL CITRATE (PF) 100 MCG/2ML IJ SOLN
INTRAMUSCULAR | Status: DC | PRN
  Administered 2019-09-09 (×6): 50 ug via INTRAVENOUS

## 2019-09-09 MED ORDER — CHLORHEXIDINE GLUCONATE 4 % EX LIQD: 60.0000 mL | Freq: Once | CUTANEOUS | Status: DC

## 2019-09-09 MED ORDER — FENTANYL CITRATE (PF) 100 MCG/2ML IJ SOLN: 25.0000 ug | INTRAMUSCULAR | Status: DC | PRN

## 2019-09-09 MED ORDER — SUGAMMADEX SODIUM 200 MG/2ML IV SOLN
INTRAVENOUS | Status: DC | PRN
  Administered 2019-09-09: 200 mg via INTRAVENOUS

## 2019-09-09 MED ORDER — PROMETHAZINE HCL 25 MG/ML IJ SOLN
INTRAMUSCULAR | Status: AC
  Filled 2019-09-09: qty 1

## 2019-09-09 MED ORDER — LACTATED RINGERS IV SOLN
INTRAVENOUS | Status: DC
  Administered 2019-09-09 (×2): via INTRAVENOUS

## 2019-09-09 MED ORDER — 0.9 % SODIUM CHLORIDE (POUR BTL) OPTIME
TOPICAL | Status: DC | PRN
  Administered 2019-09-09: 1000 mL

## 2019-09-09 MED ORDER — SODIUM CHLORIDE 0.9% FLUSH: 3.0000 mL | INTRAVENOUS | Status: DC | PRN

## 2019-09-09 MED ORDER — SODIUM CHLORIDE 0.9% FLUSH: 3.0000 mL | Freq: Two times a day (BID) | INTRAVENOUS | Status: DC

## 2019-09-09 MED ORDER — ACETAMINOPHEN 325 MG PO TABS: 650.0000 mg | ORAL_TABLET | ORAL | Status: DC | PRN

## 2019-09-09 MED ORDER — FENTANYL CITRATE (PF) 250 MCG/5ML IJ SOLN
INTRAMUSCULAR | Status: AC
  Filled 2019-09-09: qty 5

## 2019-09-09 MED ORDER — INDOCYANINE GREEN 25 MG IV SOLR
2.5000 mg | Freq: Once | INTRAVENOUS | Status: AC
  Administered 2019-09-09: 2.5 mg via INTRAVENOUS
  Filled 2019-09-09: qty 25

## 2019-09-09 MED ORDER — BUPIVACAINE LIPOSOME 1.3 % IJ SUSP
INTRAMUSCULAR | Status: DC | PRN
  Administered 2019-09-09: 20 mL

## 2019-09-09 MED ORDER — FENTANYL CITRATE (PF) 100 MCG/2ML IJ SOLN
INTRAMUSCULAR | Status: AC
  Filled 2019-09-09: qty 2

## 2019-09-09 MED ORDER — SCOPOLAMINE 1 MG/3DAYS TD PT72
1.0000 | MEDICATED_PATCH | TRANSDERMAL | Status: DC
  Administered 2019-09-09: 1.5 mg via TRANSDERMAL
  Filled 2019-09-09: qty 1

## 2019-09-09 MED ORDER — CEFAZOLIN SODIUM-DEXTROSE 2-4 GM/100ML-% IV SOLN
2.0000 g | INTRAVENOUS | Status: AC
  Administered 2019-09-09: 2 g via INTRAVENOUS
  Filled 2019-09-09: qty 100

## 2019-09-09 MED ORDER — DEXAMETHASONE SODIUM PHOSPHATE 10 MG/ML IJ SOLN
INTRAMUSCULAR | Status: DC | PRN
  Administered 2019-09-09: 4 mg via INTRAVENOUS

## 2019-09-09 MED ORDER — ROCURONIUM BROMIDE 10 MG/ML (PF) SYRINGE
PREFILLED_SYRINGE | INTRAVENOUS | Status: AC
  Filled 2019-09-09: qty 10

## 2019-09-09 MED ORDER — MIDAZOLAM HCL 5 MG/5ML IJ SOLN
INTRAMUSCULAR | Status: DC | PRN
  Administered 2019-09-09 (×2): 1 mg via INTRAVENOUS

## 2019-09-09 MED ORDER — BUPIVACAINE HCL (PF) 0.25 % IJ SOLN
INTRAMUSCULAR | Status: AC
  Filled 2019-09-09: qty 30

## 2019-09-09 MED ORDER — GABAPENTIN 300 MG PO CAPS
300.0000 mg | ORAL_CAPSULE | ORAL | Status: AC
  Administered 2019-09-09: 300 mg via ORAL
  Filled 2019-09-09: qty 1

## 2019-09-09 MED ORDER — LIDOCAINE HCL (CARDIAC) PF 100 MG/5ML IV SOSY
PREFILLED_SYRINGE | INTRAVENOUS | Status: DC | PRN
  Administered 2019-09-09: 30 mg via INTRAVENOUS

## 2019-09-09 MED ORDER — ACETAMINOPHEN 650 MG RE SUPP
650.0000 mg | RECTAL | Status: DC | PRN
  Filled 2019-09-09: qty 1

## 2019-09-09 MED ORDER — SODIUM CHLORIDE 0.9 % IV SOLN: 250.0000 mL | INTRAVENOUS | Status: DC | PRN

## 2019-09-09 MED FILL — traMADol HCL 50 MG TABS: 50 | 3 days supply | Qty: 15 | Fill #0

## 2019-09-09 SURGICAL SUPPLY — 53 items
ADH SKN CLS APL DERMABOND .7 (GAUZE/BANDAGES/DRESSINGS) ×1
APL PRP STRL LF DISP 70% ISPRP (MISCELLANEOUS) ×1
APPLIER CLIP 5 13 M/L LIGAMAX5 (MISCELLANEOUS)
APR CLP MED LRG 5 ANG JAW (MISCELLANEOUS)
BLADE SURG SZ11 CARB STEEL (BLADE) ×2 IMPLANT
CHLORAPREP W/TINT 26 (MISCELLANEOUS) ×2 IMPLANT
CLIP APPLIE 5 13 M/L LIGAMAX5 (MISCELLANEOUS) IMPLANT
CLIP VESOLOCK LG 6/CT PURPLE (CLIP) IMPLANT
COVER TIP SHEARS 8 DVNC (MISCELLANEOUS) ×1 IMPLANT
COVER TIP SHEARS 8MM DA VINCI (MISCELLANEOUS) ×1
COVER WAND RF STERILE (DRAPES) ×2 IMPLANT
DECANTER SPIKE VIAL GLASS SM (MISCELLANEOUS) ×2 IMPLANT
DERMABOND ADVANCED (GAUZE/BANDAGES/DRESSINGS) ×1
DERMABOND ADVANCED .7 DNX12 (GAUZE/BANDAGES/DRESSINGS) IMPLANT
DRAPE ARM DVNC X/XI (DISPOSABLE) ×4 IMPLANT
DRAPE COLUMN DVNC XI (DISPOSABLE) ×1 IMPLANT
DRAPE DA VINCI XI ARM (DISPOSABLE) ×4
DRAPE DA VINCI XI COLUMN (DISPOSABLE) ×1
ELECT REM PT RETURN 15FT ADLT (MISCELLANEOUS) ×2 IMPLANT
GAUZE 4X4 16PLY RFD (DISPOSABLE) ×2 IMPLANT
GAUZE SPONGE 2X2 8PLY STRL LF (GAUZE/BANDAGES/DRESSINGS) IMPLANT
GLOVE BIO SURGEON STRL SZ 6 (GLOVE) ×4 IMPLANT
GLOVE INDICATOR 6.5 STRL GRN (GLOVE) ×4 IMPLANT
GOWN STRL REUS W/TWL LRG LVL3 (GOWN DISPOSABLE) ×4 IMPLANT
GOWN STRL REUS W/TWL XL LVL3 (GOWN DISPOSABLE) IMPLANT
GRASPER SUT TROCAR 14GX15 (MISCELLANEOUS) IMPLANT
IRRIGATOR SUCT 8 DISP DVNC XI (IRRIGATION / IRRIGATOR) IMPLANT
IRRIGATOR SUCTION 8MM XI DISP (IRRIGATION / IRRIGATOR)
KIT BASIN OR (CUSTOM PROCEDURE TRAY) ×2 IMPLANT
KIT PROCEDURE DA VINCI SI (MISCELLANEOUS)
KIT PROCEDURE DVNC SI (MISCELLANEOUS) IMPLANT
KIT TURNOVER KIT A (KITS) IMPLANT
MANIFOLD NEPTUNE II (INSTRUMENTS) ×2 IMPLANT
NEEDLE HYPO 22GX1.5 SAFETY (NEEDLE) ×2 IMPLANT
PACK CARDIOVASCULAR III (CUSTOM PROCEDURE TRAY) ×2 IMPLANT
SEAL CANN UNIV 5-8 DVNC XI (MISCELLANEOUS) ×4 IMPLANT
SEAL XI 5MM-8MM UNIVERSAL (MISCELLANEOUS) ×4
SEALER VESSEL DA VINCI XI (MISCELLANEOUS)
SEALER VESSEL EXT DVNC XI (MISCELLANEOUS) IMPLANT
SET IRRIG TUBING LAPAROSCOPIC (IRRIGATION / IRRIGATOR) IMPLANT
SOL ANTI FOG 6CC (MISCELLANEOUS) ×1 IMPLANT
SOLUTION ANTI FOG 6CC (MISCELLANEOUS) ×1
SOLUTION ELECTROLUBE (MISCELLANEOUS) ×2 IMPLANT
SPONGE GAUZE 2X2 STER 10/PKG (GAUZE/BANDAGES/DRESSINGS)
SPONGE LAP 18X18 RF (DISPOSABLE) ×2 IMPLANT
SUT MNCRL AB 4-0 PS2 18 (SUTURE) ×2 IMPLANT
SYR CONTROL 10ML LL (SYRINGE) ×2 IMPLANT
SYS RETRIEVAL 5MM INZII UNIV (BASKET) ×2
SYSTEM RETRIEVL 5MM INZII UNIV (BASKET) ×1 IMPLANT
TOWEL OR 17X26 10 PK STRL BLUE (TOWEL DISPOSABLE) ×2 IMPLANT
TOWEL OR NON WOVEN STRL DISP B (DISPOSABLE) ×2 IMPLANT
TRAY FOLEY MTR SLVR 16FR STAT (SET/KITS/TRAYS/PACK) IMPLANT
TROCAR BLADELESS OPT 5 100 (ENDOMECHANICALS) ×2 IMPLANT

## 2019-09-09 NOTE — Anesthesia Procedure Notes (Signed)
Procedure Name: Intubation Date/Time: 09/09/2019 11:26 AM Performed by: Garrel Ridgel, CRNA Pre-anesthesia Checklist: Emergency Drugs available, Patient identified, Suction available, Patient being monitored and Timeout performed Patient Re-evaluated:Patient Re-evaluated prior to induction Oxygen Delivery Method: Circle system utilized Preoxygenation: Pre-oxygenation with 100% oxygen Induction Type: IV induction Ventilation: Mask ventilation without difficulty Laryngoscope Size: 3 Grade View: Grade I Tube type: Oral Tube size: 7.0 mm Number of attempts: 1 Airway Equipment and Method: Stylet Placement Confirmation: ETT inserted through vocal cords under direct vision and breath sounds checked- equal and bilateral Secured at: 22 cm Tube secured with: Tape Dental Injury: Teeth and Oropharynx as per pre-operative assessment

## 2019-09-09 NOTE — Anesthesia Postprocedure Evaluation (Signed)
Anesthesia Post Note  Patient: Tina Stone  Procedure(s) Performed: ROBOTIC CHOLECYSTECTOMY (N/A Abdomen)     Patient location during evaluation: PACU Anesthesia Type: General Level of consciousness: sedated Pain management: pain level controlled Vital Signs Assessment: post-procedure vital signs reviewed and stable Respiratory status: spontaneous breathing and respiratory function stable Cardiovascular status: stable Postop Assessment: no apparent nausea or vomiting Anesthetic complications: no    Last Vitals:  Vitals:   09/09/19 1415 09/09/19 1430  BP: 135/73 (!) 145/65  Pulse: 76 74  Resp: 11 12  Temp:  36.4 C  SpO2: 100% 100%    Last Pain:  Vitals:   09/09/19 1430  TempSrc:   PainSc: 3                  Demeshia Sherburne DANIEL

## 2019-09-09 NOTE — Anesthesia Preprocedure Evaluation (Signed)
Anesthesia Evaluation  Patient identified by MRN, date of birth, ID band Patient awake    Reviewed: Allergy & Precautions, NPO status , Patient's Chart, lab work & pertinent test results  History of Anesthesia Complications (+) PONV and history of anesthetic complications  Airway Mallampati: II  TM Distance: >3 FB Neck ROM: Full    Dental no notable dental hx. (+) Dental Advisory Given   Pulmonary neg pulmonary ROS, former smoker,    Pulmonary exam normal        Cardiovascular hypertension, Pt. on medications Normal cardiovascular exam     Neuro/Psych negative neurological ROS  negative psych ROS   GI/Hepatic Neg liver ROS, GERD  ,  Endo/Other  negative endocrine ROS  Renal/GU negative Renal ROS  negative genitourinary   Musculoskeletal negative musculoskeletal ROS (+)   Abdominal   Peds negative pediatric ROS (+)  Hematology negative hematology ROS (+)   Anesthesia Other Findings   Reproductive/Obstetrics negative OB ROS                             Anesthesia Physical Anesthesia Plan  ASA: II  Anesthesia Plan: General   Post-op Pain Management:    Induction: Intravenous  PONV Risk Score and Plan: 4 or greater and Ondansetron, Dexamethasone, Scopolamine patch - Pre-op and Diphenhydramine  Airway Management Planned: Oral ETT  Additional Equipment:   Intra-op Plan:   Post-operative Plan: Extubation in OR  Informed Consent: I have reviewed the patients History and Physical, chart, labs and discussed the procedure including the risks, benefits and alternatives for the proposed anesthesia with the patient or authorized representative who has indicated his/her understanding and acceptance.     Dental advisory given  Plan Discussed with: CRNA and Anesthesiologist  Anesthesia Plan Comments:         Anesthesia Quick Evaluation

## 2019-09-09 NOTE — Discharge Instructions (Signed)
LAPAROSCOPIC SURGERY: POST OP INSTRUCTIONS  ######################################################################  EAT Gradually transition to a high fiber diet with a fiber supplement over the next few weeks after discharge.  Start with a pureed / full liquid diet (see below)  WALK Walk an hour a day.  Control your pain to do that.    CONTROL PAIN Control pain so that you can walk, sleep, tolerate sneezing/coughing, go up/down stairs.  HAVE A BOWEL MOVEMENT DAILY Keep your bowels regular to avoid problems.  OK to try a laxative to override constipation.  OK to use an antidairrheal to slow down diarrhea.  Call if not better after 2 tries  CALL IF YOU HAVE PROBLEMS/CONCERNS Call if you are still struggling despite following these instructions. Call if you have concerns not answered by these instructions  ######################################################################    1. DIET: Follow a light bland diet & liquids the first 24 hours after arrival home, such as soup, liquids, starches, etc.  Be sure to drink plenty of fluids.  Quickly advance to a usual solid diet within a few days.  Avoid fast food or heavy meals as your are more likely to get nauseated or have irregular bowels.  A low-sugar, high-fiber diet for the rest of your life is ideal.  2. Take your usually prescribed home medications unless otherwise directed.  3. PAIN CONTROL: a. Pain is best controlled by a usual combination of three different methods TOGETHER: i. Ice/Heat ii. Over the counter pain medication iii. Prescription pain medication b. Most patients will experience some swelling and bruising around the incisions.  Ice packs or heating pads (30-60 minutes up to 6 times a day) will help. Use ice for the first few days to help decrease swelling and bruising, then switch to heat to help relax tight/sore spots and speed recovery.  Some people prefer to use ice alone, heat alone, alternating between ice & heat.   Experiment to what works for you.  Swelling and bruising can take several weeks to resolve.   c. It is helpful to take an over-the-counter pain medication regularly for the first few weeks.  Choose one of the following that works best for you: i. Naproxen (Aleve, etc)  Two 220mg  tabs twice a day OR Ibuprofen (Advil, etc) Three 200mg  tabs four times a day (every meal & bedtime) AND ii. Acetaminophen (Tylenol, etc) 500-650mg  four times a day (every meal & bedtime) d. A  prescription for pain medication (such as oxycodone, hydrocodone, tramadol, gabapentin, methocarbamol, etc) should be given to you upon discharge.  Take your pain medication as prescribed, if needed.  i. If you are having problems/concerns with the prescription medicine (does not control pain, nausea, vomiting, rash, itching, etc), please call us [phone] to see if we need to switch you to a different pain medicine that will work better for you and/or control your side effect better. ii. If you need a refill on your pain medication, please give [place] 48 hour notice.  contact your pharmacy.  They will contact our office to request authorization. Prescriptions will not be filled after 5 pm or on week-ends  4. Avoid getting constipated.   a. Between the surgery and the pain medications, it is common to experience some constipation.   b. Increasing fluid intake and taking a fiber supplement (such as Metamucil, Citrucel, FiberCon, MiraLax, etc) 1-2 times a day regularly will usually help prevent this problem from occurring.   c. A mild laxative (prune juice, Milk of Magnesia, MiraLax, etc) should be  taken according to package directions if there are no bowel movements after 48 hours.   5. Watch out for diarrhea.   a. If you have many loose bowel movements, simplify your diet to bland foods & liquids for a few days.   b. Stop any stool softeners and decrease your fiber supplement.   c. Switching to mild anti-diarrheal medications  (Kayopectate, Pepto Bismol) can help.   d. If this worsens or does not improve, please call us.  6. Wash / shower every day.  You may shower over the skin glue which is waterproof.  Continue to shower over incision(s) after the dressing is off.  7. Glue will flake off after 2 weeks.  You may leave the incisions open to air.  You may replace a dressing/Band-Aid to cover the incisions for comfort if you wish.   8. ACTIVITIES as tolerated:   a. You may resume regular (light) daily activities beginning the next day--such as daily self-care, walking, climbing stairs--gradually increasing activities as tolerated.  If you can walk 30 minutes without difficulty, it is safe to try more intense activity such as jogging, treadmill, bicycling, low-impact aerobics, swimming, etc. b. Save the most intensive and strenuous activity for last such as sit-ups, heavy lifting, contact sports, etc  Refrain from any heavy lifting or straining until you are off narcotics for pain control.   c. DO NOT PUSH THROUGH PAIN.  Let pain be your guide: If it hurts to do something, don't do it.  Pain is your body warning you to avoid that activity for another week until the pain goes down. d. You may drive when you are no longer taking prescription pain medication, you can comfortably wear a seatbelt, and you can safely maneuver your car and apply brakes. e. Dennis Bast may have sexual intercourse when it is comfortable.  9. FOLLOW UP in our office a. Please call [facility] at [phone] to set up an appointment to see your surgeon in the office for a follow-up appointment approximately 2-3 weeks after your surgery. b. Make sure that you call for this appointment the day you arrive home to insure a convenient appointment time.  10. IF YOU HAVE DISABILITY OR FAMILY LEAVE FORMS, BRING THEM TO THE OFFICE FOR PROCESSING.  DO NOT GIVE THEM TO YOUR DOCTOR.   WHEN TO CALL us [phone]: 1. Poor pain control 2. Reactions / problems with new  medications (rash/itching, nausea, etc)  3. Fever over 101.5 F (38.5 C) 4. Inability to urinate 5. Nausea and/or vomiting 6. Worsening swelling or bruising 7. Continued bleeding from incision. 8. Increased pain, redness, or drainage from the incision   The clinic staff is available to answer your questions during regular business hours (8:30am-5pm).  Please dont hesitate to call and ask to speak to one of our nurses for clinical concerns.   If you have a medical emergency, go to the nearest emergency room or call 911.  A surgeon from [facility] is always on call at the [facility], [place], [place], [place], [place]  [id number] ? MAIN: [phone] ? TOLL FREE: [phone] ?  FAX [phone] [email or web address]

## 2019-09-09 NOTE — Transfer of Care (Signed)
Immediate Anesthesia Transfer of Care Note  Patient: Tina Stone  Procedure(s) Performed: ROBOTIC CHOLECYSTECTOMY (N/A Abdomen)  Patient Location: PACU  Anesthesia Type:General  Level of Consciousness: awake, alert , oriented and patient cooperative  Airway & Oxygen Therapy: Patient Spontanous Breathing and Patient connected to face mask oxygen  Post-op Assessment: Report given to RN and Post -op Vital signs reviewed and stable  Post vital signs: Reviewed and stable  Last Vitals:  Vitals Value Taken Time  BP 152/86 09/09/19 1311  Temp    Pulse 95 09/09/19 1313  Resp 21 09/09/19 1313  SpO2 100 % 09/09/19 1313  Vitals shown include unvalidated device data.  Last Pain:  Vitals:   09/09/19 0847  TempSrc:   PainSc: 0-No pain         Complications: No apparent anesthesia complications

## 2019-09-09 NOTE — H&P (Signed)
Surgical H&P CC: biliary colic  HPI: this is a very pleasant 69 year old woman who is referred by the emergency room for biliary colic.  She presented there with persistent right upper quadrant and epigastric pain couple of weeks ago. The patient had begun her on in the morning and her ER evaluation started around 11 PM that day.  Ultimately did get better with small dose of morphine.  Pain was associated with nausea but no emesis, no fevers.  He was not relieved by milk of magnesia.  She denies any prior similar episodes of pain nor any since, but she does note a history of indigestion and heartburn.       Allergies  Allergen Reactions  . Benicar Hct [Olmesartan Medoxomil-Hctz] Shortness Of Breath and Palpitations  . Bactrim [Sulfamethoxazole-Trimethoprim] Other (See Comments)    Abdominal pain, dizziness  . Pravastatin Other (See Comments)    Lower abdominal pain  . Shellfish Allergy Nausea And Vomiting  . Amlodipine Palpitations        Past Medical History:  Diagnosis Date  . Adenomatous colon polyp   . Allergic rhinitis, seasonal   . Allergy   . Beta thalassemia trait 11/25/2013  . Cholelithiasis   . Class 3 obesity without serious comorbidity with body mass index (BMI) of 40.0 to 44.9 in adult 11/19/2012  . GERD (gastroesophageal reflux disease)   . HLD (hyperlipidemia)   . Hypercholesterolemia   . Hypertension 03/18/2017   no meds   . Intraductal papilloma of left breast    Patient underwent left needle-localized lumpectomy by Dr. Imogene Burn. Tsuei on 09/09/2013; pathology showed intraductal papilloma with no atypia or malignancy identified.  Marland Kitchen PONV (postoperative nausea and vomiting)   . Prediabetes 03/18/2017   History of elevated glucose with A1c of 5.8% in 2013. This is consistent with obesity and possible metabolic syndrome. Plan to check hemoglobin A1c today and annually.  Marland Kitchen Uterine fibroid          Past Surgical History:  Procedure Laterality  Date  . [place]   right  . BREAST EXCISIONAL BIOPSY Left 2014   benign  . BREAST LUMPECTOMY WITH NEEDLE LOCALIZATION Left 09/09/2013   Procedure: BREAST LUMPECTOMY WITH NEEDLE LOCALIZATION;  Surgeon: Imogene Burn. Georgette Dover, MD;  Location: [facility];  Service: General;  Laterality: Left;  . COLONOSCOPY    . POLYPECTOMY           Family History  Problem Relation Age of Onset  . Diabetes Mother   . Hypertension Mother   . Colon polyps Mother 76  . Dementia Mother 24  . Diabetes Father   . Congestive Heart Failure Father   . Pancreatic cancer Paternal Aunt   . Colon cancer Neg Hx   . Breast cancer Neg Hx   . Lung cancer Neg Hx   . Esophageal cancer Neg Hx   . Rectal cancer Neg Hx   . Stomach cancer Neg Hx     Social History        Socioeconomic History  . Marital status: Widowed    Spouse name: Not on file  . Number of children: Not on file  . Years of education: Not on file  . Highest education level: Not on file  Occupational History  . Not on file  Social Needs  . Financial resource strain: Not on file  . Food insecurity    Worry: Not on file    Inability: Not on file  . Transportation needs  Medical: Not on file    Non-medical: Not on file  Tobacco Use  . Smoking status: Former Research scientist (life sciences)  . Smokeless tobacco: Never Used  . Tobacco comment: few puffs but not a true smoker   Substance and Sexual Activity  . Alcohol use: Never    Frequency: Never  . Drug use: Never  . Sexual activity: Not on file  Lifestyle  . Physical activity    Days per week: Not on file    Minutes per session: Not on file  . Stress: Not on file  Relationships  . Social Herbalist on phone: Not on file    Gets together: Not on file    Attends religious service: Not on file    Active member of club or organization: Not on file    Attends meetings of clubs or organizations: Not on file    Relationship status: Not on  file  Other Topics Concern  . 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 ALOE VERA PO Take by mouth. 2 tbsp prn    . APPLE CIDER VINEGAR PO Take by mouth. 1 tsp daily    . EPINEPHrine (EPIPEN 2-PAK) 0.3 mg/0.3 mL IJ SOAJ injection Inject 0.3 mLs (0.3 mg total) into the muscle as needed for anaphylaxis. 1 Device 1  . HYDROcodone-acetaminophen (NORCO) 5-325 MG tablet Take 1 tablet by mouth every 4 (four) hours as needed for moderate pain. 10 tablet 0  . olmesartan (BENICAR) 5 MG tablet Take 1 tablet (5 mg total) by mouth daily. 90 tablet 1  . ondansetron (ZOFRAN) 4 MG tablet Take 1 tablet (4 mg total) by mouth every 6 (six) hours as needed for nausea. 12 tablet 0  . potassium chloride (K-DUR) 10 MEQ tablet     . tiZANidine (ZANAFLEX) 4 MG tablet Take 1 tablet (4 mg total) by mouth every 8 (eight) hours as needed for muscle spasms. 30 tablet 0   No current facility-administered medications on file prior to visit.     Review of Systems: a complete, 10pt review of systems was completed with pertinent positives and negatives as documented in the HPI  Physical Exam: Vitals Emeline Gins CMA; 08/07/2019 2:41 PM) 08/07/2019 2:40 PM Weight: 213.2 lb Height: 64in Body Surface Area: 2.01 m Body Mass Index: 36.6 kg/m  Temp.: 98.55F  Pulse: 130 (Regular)  BP: 146/90 (Sitting, Left Arm, Standard)  Gen: alert and well appearing Eye: extraocular motion intact, no scleral icterus ENT: moist mucus membranes, dentition intact Neck: no mass or thyromegaly Chest: unlabored respirations, symmetrical air entry, clear bilaterally CV: regular rate and rhythm, no pedal edema Abdomen: soft, nontender, nondistended. No mass or organomegaly MSK: strength  symmetrical throughout, no deformity Neuro: grossly intact, normal gait Psych: normal mood and affect, appropriate insight Skin: warm and dry, no rash or lesion on limited exam   CBC Latest Ref Rng & Units 07/18/2019 02/11/2019 02/07/2019  WBC 4.0 - 10.5 K/uL 9.1 4.8 5.0  Hemoglobin 12.0 - 15.0 g/dL 12.8 12.9 12.6  Hematocrit 36.0 - 46.0 % 40.0 40.8 39.8  Platelets 150 - 400 K/uL 243 250 242    CMP Latest Ref Rng &  Units 07/18/2019 05/06/2019 02/13/2019  Glucose 70 - 99 mg/dL 129(H) 98 87  BUN 8 - 23 mg/dL 10 9 12   Creatinine 0.44 - 1.00 mg/dL 0.78 0.90 1.04  Sodium 135 - 145 mmol/L 135 138 138  Potassium 3.5 - 5.1 mmol/L 3.5 4.0 4.1  Chloride 98 - 111 mmol/L 100 102 100  CO2 22 - 32 mmol/L 23 26 29   Calcium 8.9 - 10.3 mg/dL 8.9 8.7 9.3  Total Protein 6.5 - 8.1 g/dL 7.5 - -  Total Bilirubin 0.3 - 1.2 mg/dL 0.8 - -  Alkaline Phos 38 - 126 U/L 82 - -  AST 15 - 41 U/L 21 - -  ALT 0 - 44 U/L 15 - -    Recent Labs       Lab Results  Component Value Date   INR 0.98 02/07/2019   INR 1.01 08/23/2018   INR 1.00 02/24/2018      Imaging: Imaging Results (Last 48 hours)  No results found.     A/P: BILIARY COLIC ([id number]) Story: Discussed the anatomy of the biliary tract, pathophysiology of gallstones and the technique of laparoscopic cholecystectomy using a diagram to demonstrate. Given the symptoms she experienced this likely represents an attack of biliary colic versus transient cholecystitis. I recommend proceeding with robotic or laparoscopic cholecystectomy with possible cholangiogram. Discussed risks of surgery including bleeding, pain, scarring, intraabdominal injury specifically to the common bile duct and sequelae, bile leak, conversion to open surgery, failure to resolve symptoms, blood clots/ pulmonary embolus, heart attack, pneumonia, stroke, death. Questions welcomed and answered to patient's satisfaction. We also discussed the option of nonoperative management and  low-fat diet with risks of recurrent symptoms, cholecystitis, cholangitis or pancreatitis and signs and symptoms that should prompt her to seek emergency treatment.  She would like to proceed with surgery.   Romana Juniper, MD [facility], [place] Pager [phone]

## 2019-09-09 NOTE — Op Note (Signed)
Operative Note  Barrett Duchemin 69 y.o. female [id number]  09/09/2019  Surgeon: Clovis Riley MD FACS  Assistant: Carlena Hurl PA-C  Procedure performed: [facility] Robotic Cholecystectomy  Preop diagnosis: biliary colic Post-op diagnosis/intraop findings: same  Specimens: gallbladder  Retained items: none  EBL: minimal  Complications: none  Description of procedure: After obtaining informed consent the patient was brought to the operating room. Antibiotics were administered. SCD's were applied. General endotracheal anesthesia was initiated and a formal time-out was performed. The abdomen was prepped and draped in the usual sterile fashion and the abdomen was entered using visiport technique in the left upper quadrant after instilling the site with local. Insufflation to 66mmHg was obtained without issue and gross inspection revealed no evidence of injury from our entry or other intraabdominal abnormalities. Three 58mm robotic trocars were placed under direct visualization across the upper abdomen after infiltrating each port site with local (exparel mixed with marcaine). The entry port was exchanged for an 20mm robotic trocar. Bilateral laparoscopic assisted TAPS blocks were performed.   At this point the robot was docked and robotic instruments inserted under direct visualization. The gallbladder was retracted cephalad and the infundibulum was retracted laterally. A combination of hook electrocautery and blunt dissection was utilized to clear the peritoneum from the neck and cystic duct, circumferentially isolating the cystic artery and cystic duct and lifting the gallbladder from the cystic plate. The critical view of safety was achieved with the cystic artery, cystic duct, and liver bed visualized between them with no other structures. The common bile duct was also visible and well away from the area of dissection. The patient had been given ICG just before the procedure began, it was seen  faintly in the common bile duct and cystic duct but most of it remained in the liver parenchyma.  The artery was clipped with a single clip proximally and distally and divided as was the cystic duct with three clips on the proximal end. The gallbladder was dissected from the liver plate using electrocautery. Once freed the gallbladder was placed in an endocatch bag and removed intact through the left upper quadrant trocar site. Hemostasis was once again confirmed, and reinspection of the abdomen revealed no injuries. The clips were well opposed without any bile leak from the cystic duct or the liver bed.   The robotic instruments are removed under direct visualization, robot undocked, abdomen was desufflated and all trocars removed. The skin incisions were closed with subcuticular monocryl and Dermabond. The patient was awakened, extubated and transported to the recovery room in stable condition.    All counts were correct at the completion of the case.

## 2019-09-14 ENCOUNTER — Ambulatory Visit (HOSPITAL_BASED_OUTPATIENT_CLINIC_OR_DEPARTMENT_OTHER)
Admission: RE | Admit: 2019-09-14 | Discharge: 2019-09-14 | Disposition: A | Discharge status: Home / Self Care | Payer: Medicare Other | Source: Ambulatory Visit | Attending: Obstetrics and Gynecology | Admitting: Obstetrics and Gynecology

## 2019-09-14 ENCOUNTER — Other Ambulatory Visit: Payer: Self-pay

## 2019-09-14 ENCOUNTER — Encounter (HOSPITAL_BASED_OUTPATIENT_CLINIC_OR_DEPARTMENT_OTHER): Payer: Self-pay | Admitting: Radiology

## 2019-09-14 ENCOUNTER — Ambulatory Visit: Payer: Medicare Other | Admitting: Family Medicine

## 2019-09-14 DIAGNOSIS — N950 Postmenopausal bleeding: Secondary | ICD-10-CM | POA: Insufficient documentation

## 2019-09-15 ENCOUNTER — Telehealth: Payer: Self-pay | Admitting: Family Medicine

## 2019-09-15 ENCOUNTER — Ambulatory Visit: Payer: Medicare Other | Admitting: Family Medicine

## 2019-09-15 NOTE — Telephone Encounter (Signed)
Copied from [place] [id number]. Topic: Appointment Scheduling - Scheduling Inquiry for Clinic >> Sep 15, 2019  7:04 AM Rayann Heman wrote: Reason for CRM: pt called and stated that she needs to get a flu shot and pneumonia shot and tetanus shot to be able to return to work today.

## 2019-09-15 NOTE — Telephone Encounter (Signed)
PEC scheduled

## 2019-09-15 NOTE — Telephone Encounter (Signed)
I know the flu shot is ok to schedule but are the other two ok to schedule?

## 2019-09-15 NOTE — Telephone Encounter (Signed)
Tdap last given in 2014 Ok to give pneumonia and flu vaccine.

## 2019-09-16 ENCOUNTER — Ambulatory Visit: Payer: Medicare Other

## 2019-09-16 ENCOUNTER — Ambulatory Visit (INDEPENDENT_AMBULATORY_CARE_PROVIDER_SITE_OTHER): Payer: Medicare Other | Admitting: Behavioral Health

## 2019-09-16 ENCOUNTER — Other Ambulatory Visit: Payer: Self-pay

## 2019-09-16 DIAGNOSIS — Z23 Encounter for immunization: Secondary | ICD-10-CM | POA: Diagnosis not present

## 2019-09-16 NOTE — Progress Notes (Signed)
Patient presents in office today for Influenza & Pneumovax 23 vaccinations. IM injections were given in both the right & left deltoids. Patient tolerated both injections well. No signs or symptoms of a reaction were noted prior to patient leaving the nurse visit.

## 2019-09-18 ENCOUNTER — Encounter: Payer: Self-pay | Admitting: Family Medicine

## 2019-09-23 ENCOUNTER — Other Ambulatory Visit (HOSPITAL_COMMUNITY)
Admission: RE | Admit: 2019-09-23 | Discharge: 2019-09-23 | Disposition: A | Discharge status: Home / Self Care | Payer: Medicare Other | Source: Ambulatory Visit | Attending: Obstetrics & Gynecology | Admitting: Obstetrics & Gynecology

## 2019-09-23 ENCOUNTER — Other Ambulatory Visit: Payer: Self-pay

## 2019-09-23 ENCOUNTER — Encounter: Payer: Self-pay | Admitting: Obstetrics & Gynecology

## 2019-09-23 ENCOUNTER — Ambulatory Visit (INDEPENDENT_AMBULATORY_CARE_PROVIDER_SITE_OTHER): Payer: Medicare Other | Admitting: Obstetrics & Gynecology

## 2019-09-23 VITALS — BP 122/79 | Ht 64.0 in | Wt 211.0 lb

## 2019-09-23 DIAGNOSIS — N950 Postmenopausal bleeding: Secondary | ICD-10-CM

## 2019-09-23 DIAGNOSIS — C541 Malignant neoplasm of endometrium: Secondary | ICD-10-CM

## 2019-09-23 DIAGNOSIS — R9389 Abnormal findings on diagnostic imaging of other specified body structures: Secondary | ICD-10-CM | POA: Insufficient documentation

## 2019-09-23 NOTE — Patient Instructions (Signed)
Endometrial Biopsy  Endometrial biopsy is a procedure in which a tissue sample is taken from inside the uterus. The sample is taken from the endometrium, which is the lining of the uterus. The tissue sample is then checked under a microscope to see if the tissue is normal or abnormal. This procedure helps to determine where you are in your menstrual cycle and how hormone levels are affecting the lining of the uterus. This procedure may also be used to evaluate uterine bleeding or to diagnose endometrial cancer, endometrial tuberculosis, polyps, or other inflammatory conditions. Tell a health care provider about:  Any allergies you have.  All medicines you are taking, including vitamins, herbs, eye drops, creams, and over-the-counter medicines.  Any problems you or family members have had with anesthetic medicines.  Any blood disorders you have.  Any surgeries you have had.  Any medical conditions you have.  Whether you are pregnant or may be pregnant. What are the risks? Generally, this is a safe procedure. However, problems may occur, including:  Bleeding.  Pelvic infection.  Puncture of the wall of the uterus with the biopsy device (rare). What happens before the procedure?  Keep a record of your menstrual cycles as told by your health care provider. You may need to schedule your procedure for a specific time in your cycle.  You may want to bring a sanitary pad to wear after the procedure.  Ask your health care provider about: ? Changing or stopping your regular medicines. This is especially important if you are taking diabetes medicines or blood thinners. ? Taking medicines such as aspirin and ibuprofen. These medicines can thin your blood. Do not take these medicines before your procedure if your health care provider instructs you not to.  Plan to have someone take you home from the hospital or clinic. What happens during the procedure?  To lower your risk of infection: ?  Your health care team will wash or sanitize their hands.  You will lie on an exam table with your feet and legs supported as in a pelvic exam.  Your health care provider will insert an instrument (speculum) into your vagina to see your cervix.  Your cervix will be cleansed with an antiseptic solution.  A medicine (local anesthetic) will be used to numb the cervix.  A forceps instrument (tenaculum) will be used to hold your cervix steady for the biopsy.  A thin, rod-like instrument (uterine sound) will be inserted through your cervix to determine the length of your uterus and the location where the biopsy sample will be removed.  A thin, flexible tube (catheter) will be inserted through your cervix and into the uterus. The catheter will be used to collect the biopsy sample from your endometrial tissue.  The catheter and speculum will then be removed, and the tissue sample will be sent to a lab for examination. What happens after the procedure?  You will rest in a recovery area until you are ready to go home.  You may have mild cramping and a small amount of vaginal bleeding. This is normal.  It is up to you to get the results of your procedure. Ask your health care provider, or the department that is doing the procedure, when your results will be ready. Summary  Endometrial biopsy is a procedure in which a tissue sample is taken from the endometrium, which is the lining of the uterus.  This procedure may help to diagnose menstrual cycle problems, abnormal bleeding, or other conditions affecting  the endometrium.  Before the procedure, keep a record of your menstrual cycles as told by your health care provider.  The tissue sample that is removed will be checked under a microscope to see if it is normal or abnormal. This information is not intended to replace advice given to you by your health care provider. Make sure you discuss any questions you have with your health care provider.  Document Released: 04/19/2005 Document Revised: 11/29/2017 Document Reviewed: 01/02/2017 Elsevier Patient Education  2020 [employer]. Endometrial Biopsy, Care After This sheet gives you information about how to care for yourself after your procedure. Your health care provider may also give you more specific instructions. If you have problems or questions, contact your health care provider. What can I expect after the procedure? After the procedure, it is common to have:  Mild cramping.  A small amount of vaginal bleeding for a few days. This is normal. Follow these instructions at home:   Take over-the-counter and prescription medicines only as told by your health care provider.  Do not douche, use tampons, or have sexual intercourse until your health care provider approves.  Return to your normal activities as told by your health care provider. Ask your health care provider what activities are safe for you.  Follow instructions from your health care provider about any activity restrictions, such as restrictions on strenuous exercise or heavy lifting. Contact a health care provider if:  You have heavy bleeding, or bleed for longer than 2 days after the procedure.  You have bad smelling discharge from your vagina.  You have a fever or chills.  You have a burning sensation when urinating or you have difficulty urinating.  You have severe pain in your lower abdomen. Get help right away if:  You have severe cramps in your stomach or back.  You pass large blood clots.  Your bleeding increases.  You become weak or light-headed, or you pass out. Summary  After the procedure, it is common to have mild cramping and a small amount of vaginal bleeding for a few days.  Do not douche, use tampons, or have sexual intercourse until your health care provider approves.  Return to your normal activities as told by your health care provider. Ask your health care provider what  activities are safe for you. This information is not intended to replace advice given to you by your health care provider. Make sure you discuss any questions you have with your health care provider. Document Released: 10/07/2013 Document Revised: 11/29/2017 Document Reviewed: 01/02/2017 Elsevier Patient Education  2020 [employer].

## 2019-09-23 NOTE — Addendum Note (Signed)
Addended by: Lavonia Drafts on: 09/23/2019 03:00 PM   Modules accepted: Orders

## 2019-09-23 NOTE — Progress Notes (Signed)
Pt was seen prev with a complaint of PMPB. G1P0010 She is here for a endo bx. She reports that the bleeding is usually pinkish but has been present for a while. She denies pelvic pain. Her LNMP was at age [age]. The pt reports a ho uterine fibords.    09/14/2019 CLINICAL DATA:  Post menopausal bleeding  EXAM: TRANSABDOMINAL AND TRANSVAGINAL ULTRASOUND OF PELVIS  TECHNIQUE: Both transabdominal and transvaginal ultrasound examinations of the pelvis were performed. Transabdominal technique was performed for global imaging of the pelvis including uterus, ovaries, adnexal regions, and pelvic cul-de-sac. It was necessary to proceed with endovaginal exam following the transabdominal exam to visualize the endometrium and adnexa.  COMPARISON:  Ultrasound 08/04/2009  FINDINGS: Uterus  Measurements: 16.8 x 7.3 x 14.5 cm = volume: 922 mL. Lobulated uterine contour with numerous masses. The largest masses were measured. Right fundal exophytic mass measuring 5.5 x 4.1 x 5.7 cm. Left fundal exophytic mass measuring 4.8 by 4.5 x 5.5 cm. Left lower uterine segment exophytic mass measuring 5.2 x 5.4 x 5.3 cm. Anterior mid uterine mass measuring 5 x 3.6 x 5.2 cm. Multiple additional myometrial masses.  Endometrium  Thickness: 6.2 mm.  No focal abnormality visualized.  Right ovary  Not seen  Left ovary  Not seen  Other findings  No abnormal free fluid.  IMPRESSION: 1. Enlarged uterus with numerous myometrial masses, presumably fibroids. 2. Endometrial thickness of 6.2 mm. In the setting of post-menopausal bleeding, endometrial sampling is indicated to exclude carcinoma. If results are benign, sonohysterogram should be considered for focal lesion work-up. (Ref: Radiological Reasoning: Algorithmic Workup of Abnormal Vaginal Bleeding with Endovaginal Sonography and Sonohysterography. AJR 2008; [id number]) 3. Nonvisualized ovariesThe indications for endometrial biopsy were  reviewed.    Risks of the biopsy including cramping, bleeding, infection, uterine perforation, inadequate specimen and need for additional procedures  were discussed. The patient states she understands and agrees to undergo procedure today. Consent was signed. Time out was performed.  Pt uterus is enlarged and mobile approximately 14 weeks sized. The exam is limited by pts body habitus.  A sterile speculum was placed in the patient's vagina and the cervix was prepped with Betadine. A single-toothed tenaculum was placed on the anterior lip of the cervix to stabilize it. The 3 mm pipelle was introduced into the endometrial cavity without difficulty to a depth of 9cm, and a moderate amount of tissue was obtained and sent to pathology. The instruments were removed from the patient's vagina. Minimal bleeding from the cervix was noted. The patient tolerated the procedure well. Routine post-procedure instructions were given to the patient. The patient will follow up to review the results and for further management.    Tristine Langi L. Harraway-Smith, M.D., [facility]

## 2019-09-25 LAB — SURGICAL PATHOLOGY

## 2019-09-28 ENCOUNTER — Ambulatory Visit: Payer: Medicare Other | Admitting: Family Medicine

## 2019-10-02 ENCOUNTER — Ambulatory Visit (INDEPENDENT_AMBULATORY_CARE_PROVIDER_SITE_OTHER): Payer: Medicare Other | Admitting: Obstetrics & Gynecology

## 2019-10-02 ENCOUNTER — Encounter: Payer: Self-pay | Admitting: Obstetrics & Gynecology

## 2019-10-02 ENCOUNTER — Other Ambulatory Visit: Payer: Self-pay

## 2019-10-02 VITALS — BP 161/86 | HR 81 | Ht 64.0 in | Wt 210.0 lb

## 2019-10-02 DIAGNOSIS — C541 Malignant neoplasm of endometrium: Secondary | ICD-10-CM

## 2019-10-02 DIAGNOSIS — N950 Postmenopausal bleeding: Secondary | ICD-10-CM

## 2019-10-02 NOTE — Patient Instructions (Signed)
Uterine Cancer  Uterine cancer is an abnormal growth of cancer tissue (malignant tumor) in the uterus. Unlike noncancerous (benign) tumors, malignant tumors can spread to other parts of the body. Uterine cancer usually occurs after menopause. However, it may also occur around the time that menopause begins. The wall of the uterus has an inner layer of tissue (endometrium) and an outer layer of muscle tissue (myometrium). The most common type of uterine cancer begins in the endometrium (endometrial cancer). Cancer that begins in the myometrium (uterine sarcoma) is very rare. What are the causes? The exact cause of this condition is not known. What increases the risk? You are more likely to develop this condition if you:  Are older than 50.  Have an enlarged endometrium (endometrial hyperplasia).  Use hormone therapy.  Are severely overweight (obese).  Use the medicine tamoxifen.  You are white (Caucasian).  Cannot bear children (are infertile).  Have never been pregnant.  Started menstruating at an age younger than 12 years.  Are older than 52 and are still having menstrual periods.  Have a history of cancer of the ovaries, intestines, or colon or rectum (colorectal cancer).  Have a history of enlarged ovaries with small cysts (polycystic ovarian syndrome).  Have a family history of: ? Uterine cancer. ? Hereditary nonpolyposis colon cancer (HNPCC).  Have diabetes, high blood pressure, thyroid disease, or gallbladder disease.  Use long-term, high-dose birth control pills.  Have been exposed to radiation.  Smoke. What are the signs or symptoms? Symptoms of this condition include:  Abnormal vaginal bleeding or discharge. Bleeding may start as a watery, blood-streaked flow that gradually contains more blood. This is the most common symptom. If you experience abnormal vaginal bleeding, do not assume that it is part of menopause.  Vaginal bleeding after menopause.   Unexplained weight loss.  Bleeding between periods.  Urination that is difficult, painful, or more frequent than usual.  A lump (mass) in the vagina.  Pain, bloating, or fullness in the abdomen.  Pain in the pelvic area.  Pain during sex. How is this diagnosed? This condition may be diagnosed based on:  Your medical history and your symptoms.  A physical and pelvic exam. Your health care provider will feel your pelvis for any growths or enlarged lymph nodes.  Blood and urine tests.  Imaging tests, such as X-rays, CT scans, ultrasound, or MRIs.  A procedure in which a thin, flexible tube with a light and camera on the end is inserted through the vagina and used to look inside the uterus (hysteroscopy).  A Pap test to check for abnormal cells in the lower part of the uterus (cervix) and the upper vagina.  Removing a tissue sample (biopsy) from the uterine lining to check for cancer cells.  Dilation and curettage (D&C). This is a procedure that involves stretching (dilation) the cervix and scraping (curettage) the inside lining of the uterus to get a biopsy and check for cancer cells. Your cancer will be staged to determine its severity and extent. Staging is an assessment of:  The size of the tumor.  Whether the cancer has spread.  Where the cancer has spread. The stages of uterine cancer are as follows:  Stage I. The cancer is only found in the uterus.  Stage II. The cancer has spread to the cervix.  Stage III. The cancer has spread outside the uterus, but not outside the pelvis. The cancer may have spread to the lymph nodes in the pelvis. Lymph nodes are   part of your body's disease-fighting (immune) system. Lymph nodes are found in many locations in your body, including the neck, underarm, and groin.  Stage IV. The cancer has spread to other parts of the body, such as the bladder or rectum. How is this treated? This condition is often treated with surgery to remove:   The uterus, cervix, fallopian tubes, and ovaries (total hysterectomy).  The uterus and cervix (simple hysterectomy). The type of hysterectomy you will have depends on the extent of your cancer. Lymph nodes near the uterus may also be removed in some cases. Treatment may also include one or more of the following:  Chemotherapy. This uses medicines to kill the cancer cells and prevent their spread.  Radiation therapy. This uses high-energy rays to kill the cancer cells and prevent the spread of cancer.  Chemoradiation. This is a combination treatment that alternates chemotherapy with radiation treatments to enhance the way radiation works.  Brachytherapy. This involves placing radioactive materials inside the body where the cancer was removed.  Hormone therapy. This includes taking medicines that lower the levels of estrogen in the body. Follow these instructions at home: Activity  Return to your normal activities as told by your health care provider. Ask your health care provider what activities are safe for you.  Exercise regularly as told by your health care provider.  Do not drive or use heavy machinery while taking prescription pain medicine. General instructions  Take over-the-counter and prescription medicines only as told by your health care provider.  Maintain a healthy diet.  Work with your health care provider to: ? Manage any long-term (chronic) conditions you have, such as diabetes, high blood pressure, thyroid disease, or gallbladder disease. ? Manage any side effects of your treatment.  Do not use any products that contain nicotine or tobacco, such as cigarettes and e-cigarettes. If you need help quitting, ask your health care provider.  Consider joining a support group to help you cope with stress. Your health care provider may be able to recommend a local or online support group.  Keep all follow-up visits as told by your health care provider. This is important.  Where to find more information  American Cancer Society: https://www.cancer.[place]): https://www.cancer.gov Contact a health care provider if:  You have pain in your pelvis or abdomen that gets worse.  You cannot urinate.  You have abnormal bleeding.  You have a fever. Get help right away if:  You develop sudden or new severe symptoms, such as: ? Heavy bleeding. ? Severe weakness. ? Pain that is severe or does not get better with medicine. Summary  Uterine cancer is an abnormal growth of cancer tissue (malignant tumor) in the uterus. The most common type of uterine cancer begins in the endometrium (endometrial cancer).  This condition is often treated with surgery to remove the uterus, cervix, fallopian tubes, and ovaries (total hysterectomy) or the uterus and cervix (simple hysterectomy).  Work with your health care provider to manage any long-term (chronic) conditions you have, such as diabetes, high blood pressure, thyroid disease, or gallbladder disease.  Consider joining a support group to help you cope with stress. Your health care provider may be able to recommend a local or online support group. This information is not intended to replace advice given to you by your health care provider. Make sure you discuss any questions you have with your health care provider. Document Released: 12/17/2005 Document Revised: 11/29/2017 Document Reviewed: 12/14/2016 Elsevier Patient Education  2020 Elsevier Inc.  

## 2019-10-02 NOTE — Progress Notes (Signed)
History:  69 y.o. G1P0010 here today for f/u of endo bx. Pt reports a h/o PMPB that started in Sept. She reports that she followed up with a provider right away and was referred to me. She denies pain or any constitutional sx. She denies unexplained weight loss.  She cont to have very 'red blood' that is not heavy.    Pt denies FH of ovarian, breast or uterine cancer. She does not smoke tobacco products.    The following portions of the patient's history were reviewed and updated as appropriate: allergies, current medications, past family history, past medical history, past social history, past surgical history and problem list.  Review of Systems:  Pertinent items are noted in HPI.    Objective:  Physical Exam Blood pressure (!) 161/86, pulse 81, height 5\' 4"  (1.626 m), weight 210 lb (95.3 kg).  CONSTITUTIONAL: Well-developed, well-nourished female in no acute distress.  HENT:  Normocephalic, atraumatic EYES: Conjunctivae and EOM are normal. No scleral icterus.  NECK: Normal range of motion SKIN: Skin is warm and dry. No rash noted. Not diaphoretic.No pallor. [place]: Alert and oriented to person, place, and time. Normal coordination.  Pelvic: not done  Labs and Imaging US Pelvic Complete With Transvaginal  Result Date: 09/14/2019 CLINICAL DATA:  Post menopausal bleeding EXAM: TRANSABDOMINAL AND TRANSVAGINAL ULTRASOUND OF PELVIS TECHNIQUE: Both transabdominal and transvaginal ultrasound examinations of the pelvis were performed. Transabdominal technique was performed for global imaging of the pelvis including uterus, ovaries, adnexal regions, and pelvic cul-de-sac. It was necessary to proceed with endovaginal exam following the transabdominal exam to visualize the endometrium and adnexa. COMPARISON:  Ultrasound 08/04/2009 FINDINGS: Uterus Measurements: 16.8 x 7.3 x 14.5 cm = volume: 922 mL. Lobulated uterine contour with numerous masses. The largest masses were measured. Right fundal  exophytic mass measuring 5.5 x 4.1 x 5.7 cm. Left fundal exophytic mass measuring 4.8 by 4.5 x 5.5 cm. Left lower uterine segment exophytic mass measuring 5.2 x 5.4 x 5.3 cm. Anterior mid uterine mass measuring 5 x 3.6 x 5.2 cm. Multiple additional myometrial masses. Endometrium Thickness: 6.2 mm.  No focal abnormality visualized. Right ovary Not seen Left ovary Not seen Other findings No abnormal free fluid. IMPRESSION: 1. Enlarged uterus with numerous myometrial masses, presumably fibroids. 2. Endometrial thickness of 6.2 mm. In the setting of post-menopausal bleeding, endometrial sampling is indicated to exclude carcinoma. If results are benign, sonohysterogram should be considered for focal lesion work-up. (Ref: Radiological Reasoning: Algorithmic Workup of Abnormal Vaginal Bleeding with Endovaginal Sonography and Sonohysterography. AJR 2008; [id number]) 3. Nonvisualized ovaries Electronically Signed   By: Donavan Foil M.D.   On: 09/14/2019 14:45   09/23/2019 Clinical History: PMPB, lots of blood noted when obtaining specimen,  more than usual for PMP state (cm)      DIAGNOSIS:   A. ENDOMETRIUM, BIOPSY:  - Poorly differentiated carcinoma.  - See comment.   COMMENT:   There are fragments of high-grade, poorly differentiated carcinoma  consistent with FIGO grade 3.   Dr. Tresa Moore agrees.   Called to Dr. Ihor Dow on 09/25/27.    GROSS DESCRIPTION:   Received in formalin is a 2 x 2 x 0.6 cm aggregate of gray-white to dark  red soft tissue/material, entirely submitted in 2 blocks.     Final Diagnosis performed by Claudette Laws, MD.  Electronically signed  09/25/2019  Technical component performed at [facility]. [facility], [place] [address] [place], [place] [address].  Professional component performed at [facility],  [place] [address] [place], [place] [address].  Immunohistochemistry Technical component (if applicable) was performed  at [facility]. [address], [place],  [place], [place] [address].  IMMUNOHISTOCHEMISTRY DISCLAIMER (if applicable):  Some of these immunohistochemical stains may have been developed and the  performance characteristics determine by [facility]. Some  may not have been cleared or approved by the U.S. Food and Drug  Administration. The FDA has determined that such clearance or approval  is not necessary. This test is used for clinical purposes. It should not  be regarded as investigational or for research. This laboratory is  certified under the [place]  (CLIA-88) as qualified to perform high complexity clinical laboratory  testing. The controls stained appropriately. Assessment & Plan:  PMPB in 69 yo AA female. Pathology reveals high-grade, poorly differentiated carcinoma  consistent with FIGO grade 3.  I have reviewed with pt the diagnosis and need for referral to GYN ONC and surgery. We have already made the pt an appt with Dr. Denman George should she choose this option. She was grateful for the referral.     We discussed the implications of the dx and the potential treatment option but, she is clear that Dr. Denman George will recommend the best treatment for her based on her eval. Pt was concerned about losing her uterus and I explained the implications at her age. She is comfortable with the diagnosis and agrees with referral to [place]. All of her questions were answered.   Total face-to-face time with patient was 30 min.  Greater than 50% was spent in counseling and coordination of care with the patient.   Muriah Harsha L. Harraway-Smith, M.D., [place]

## 2019-10-05 ENCOUNTER — Ambulatory Visit: Payer: Medicare Other | Admitting: Family Medicine

## 2019-10-05 ENCOUNTER — Ambulatory Visit: Payer: Medicare Other | Admitting: Obstetrics & Gynecology

## 2019-10-06 ENCOUNTER — Encounter: Payer: Self-pay | Admitting: Obstetrics & Gynecology

## 2019-10-09 ENCOUNTER — Other Ambulatory Visit: Payer: Self-pay | Admitting: Gynecologic Oncology

## 2019-10-09 ENCOUNTER — Inpatient Hospital Stay: Payer: Medicare Other | Attending: Gynecologic Oncology | Admitting: Gynecologic Oncology

## 2019-10-09 ENCOUNTER — Other Ambulatory Visit: Payer: Self-pay

## 2019-10-09 ENCOUNTER — Encounter: Payer: Self-pay | Admitting: Gynecologic Oncology

## 2019-10-09 VITALS — BP 142/87 | HR 82 | Temp 98.5°F | Resp 18 | Ht 64.0 in | Wt 214.2 lb

## 2019-10-09 DIAGNOSIS — C541 Malignant neoplasm of endometrium: Secondary | ICD-10-CM

## 2019-10-09 DIAGNOSIS — E7800 Pure hypercholesterolemia, unspecified: Secondary | ICD-10-CM | POA: Insufficient documentation

## 2019-10-09 DIAGNOSIS — E785 Hyperlipidemia, unspecified: Secondary | ICD-10-CM | POA: Diagnosis not present

## 2019-10-09 DIAGNOSIS — D563 Thalassemia minor: Secondary | ICD-10-CM | POA: Insufficient documentation

## 2019-10-09 DIAGNOSIS — Z6841 Body Mass Index (BMI) 40.0 and over, adult: Secondary | ICD-10-CM | POA: Insufficient documentation

## 2019-10-09 DIAGNOSIS — D250 Submucous leiomyoma of uterus: Secondary | ICD-10-CM

## 2019-10-09 DIAGNOSIS — E669 Obesity, unspecified: Secondary | ICD-10-CM | POA: Insufficient documentation

## 2019-10-09 DIAGNOSIS — K219 Gastro-esophageal reflux disease without esophagitis: Secondary | ICD-10-CM | POA: Diagnosis not present

## 2019-10-09 DIAGNOSIS — D259 Leiomyoma of uterus, unspecified: Secondary | ICD-10-CM

## 2019-10-09 DIAGNOSIS — D252 Subserosal leiomyoma of uterus: Secondary | ICD-10-CM

## 2019-10-09 DIAGNOSIS — I10 Essential (primary) hypertension: Secondary | ICD-10-CM | POA: Insufficient documentation

## 2019-10-09 DIAGNOSIS — J309 Allergic rhinitis, unspecified: Secondary | ICD-10-CM | POA: Diagnosis not present

## 2019-10-09 DIAGNOSIS — Z79899 Other long term (current) drug therapy: Secondary | ICD-10-CM | POA: Insufficient documentation

## 2019-10-09 MED ORDER — SENNOSIDES-DOCUSATE SODIUM 8.6-50 MG PO TABS: 2.0000 | ORAL_TABLET | Freq: Every day | ORAL | 1 refills | Status: DC

## 2019-10-09 MED ORDER — HYDROCODONE-ACETAMINOPHEN 5-325 MG PO TABS: 1.0000 | ORAL_TABLET | ORAL | 0 refills | Status: DC | PRN

## 2019-10-09 MED ORDER — IBUPROFEN 600 MG PO TABS: 600.0000 mg | ORAL_TABLET | Freq: Four times a day (QID) | ORAL | 0 refills | Status: DC | PRN

## 2019-10-09 MED FILL — IBUPROFEN 600 MG TABLET: 600 | 8 days supply | Qty: 30 | Fill #0

## 2019-10-09 MED FILL — HYDROCODON-APAP 5-325: 5-325 | 2 days supply | Qty: 10 | Fill #0

## 2019-10-09 NOTE — H&P (View-Only) (Signed)
Consult Note: Gyn-Onc  Consult was requested by Dr. Ihor Dow for the evaluation of Tina Stone 69 y.o. female  CC:  Chief Complaint  Patient presents with  . endometrial ca    Assessment/Plan:  Tina Stone  is a 69 y.o.  year old with grade 3 endometrial cancer and a fibroid uterus.   We will obtain a CT chest/abd/pelvis in accordance with NCCN guidelines to determine if there is gross metastatic disease that would change surgical planning.  A detailed discussion was held with the patient and her family with regard to to her endometrial cancer diagnosis. We discussed the standard management options for uterine cancer which includes surgery followed possibly by adjuvant therapy depending on the results of surgery. The options for surgical management include a hysterectomy and removal of the tubes and ovaries possibly with removal of pelvic and para-aortic lymph nodes.If feasible, a minimally invasive approach including a robotic hysterectomy or laparoscopic hysterectomy have benefits including shorter hospital stay, recovery time and better wound healing than with open surgery. The patient has been counseled about these surgical options and the risks of surgery in general including infection, bleeding, damage to surrounding structures (including bowel, bladder, ureters, nerves or vessels), and the postoperative risks of PE/ DVT, and lymphedema. I extensively reviewed the additional risks of robotic hysterectomy including possible need for conversion to open laparotomy.  I discussed positioning during surgery of trendelenberg and risks of minor facial swelling and care we take in preoperative positioning.  After counseling and consideration of her options, she desires to proceed with robotic assisted total hysterectomy with bilateral sapingo-oophorectomy and SLN biopsy. She will require a minilap for specimen delivery due to the large size of her uterus.  She will be seen  by anesthesia for preoperative clearance and discussion of postoperative pain management.  She was given the opportunity to ask questions, which were answered to her satisfaction, and she is agreement with the above mentioned plan of care.   HPI: [name] is a 69 year old P0 who is seen in consultation at the request of Dr Ihor Dow for grade 3 poorly differentiated endometrial cancer.  The patient reported a history of postmenopausal bleeding that began 1 to 2 months before diagnosis.  She presented for a transvaginal ultrasound scan on September 23, 2019 which revealed a fibroid uterus measuring 6.8 x 7.3 x 14.5 cm.  The endometrium was thickened at 6.2 mm.  The left and ovary and right ovaries were not seen.  This prompted a endometrial biopsy to be performed on the same day, 09/23/2019, which revealed high-grade poorly differentiated carcinoma consistent with FIGO grade 3.  The patient has had a recent history of a robotic cholecystectomy in September 2020 with Dr. Josie Saunders.  This was an uncomplicated procedure.  She otherwise has not had any abdominal surgery.  Her gynecologic history is significant for her being nulliparous, she has never had surgical procedures for her fibroid uterus.  She transition through menopause in her 7s.  She continues to have intermittent menopausal symptoms.  Her medical history is otherwise remarkable for hypertension and gastroesophageal reflux disease.  Her primary care physician is Dr. Luetta Nutting.  Her family history is unremarkable for GYN or colonic malignancies.  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  Current Meds:  Outpatient Encounter Medications as of 10/09/2019  Medication Sig  . APPLE CIDER VINEGAR PO Take 5 mLs by mouth daily.   Marland Kitchen  chlorthalidone (HYGROTON) 25 MG tablet Take 0.5 tablets (12.5 mg total) by mouth daily.  . [DISCONTINUED] docusate sodium (COLACE) 100 MG capsule Take 1 capsule  (100 mg total) by mouth 2 (two) times daily. (Patient not taking: Reported on 10/02/2019)  . [DISCONTINUED] Multiple Vitamin (MULTIVITAMIN WITH MINERALS) TABS tablet Take 1 tablet by mouth daily.  . [DISCONTINUED] traMADol (ULTRAM) 50 MG tablet Take 1 tablet (50 mg total) by mouth every 6 (six) hours as needed (For pain not relieved by tylenol/ ibuprofen).   No facility-administered encounter medications on file as of 10/09/2019.     Allergy:  Allergies  Allergen Reactions  . Benicar Hct [Olmesartan Medoxomil-Hctz] Shortness Of Breath and Palpitations  . Bactrim [Sulfamethoxazole-Trimethoprim] Other (See Comments)    Abdominal pain, dizziness  . Pravastatin Other (See Comments)    Lower abdominal pain  . Shellfish Allergy Nausea And Vomiting  . Amlodipine Palpitations    Social Hx:   Social History   Socioeconomic History  . Marital status: Widowed    Spouse name: Not on file  . Number of children: Not on file  . Years of education: Not on file  . Highest education level: Not on file  Occupational History  . Not on file  Social Needs  . Financial resource strain: Not on file  . Food insecurity    Worry: Not on file    Inability: Not on file  . Transportation needs    Medical: Not on file    Non-medical: Not on file  Tobacco Use  . Smoking status: Former Research scientist (life sciences)  . Smokeless tobacco: Never Used  . Tobacco comment: few puffs but not a true smoker quit many yrs ago  Substance and Sexual Activity  . Alcohol use: Never    Frequency: Never  . Drug use: Never  . Sexual activity: Not on file  Lifestyle  . Physical activity    Days per week: Not on file    Minutes per session: Not on file  . Stress: Not on file  Relationships  . Social Herbalist on phone: Not on file    Gets together: Not on file    Attends religious service: Not on file    Active member of club or organization: Not on file    Attends meetings of clubs or organizations: Not on file     Relationship status: Not on file  . Intimate partner violence    Fear of current or ex partner: Not on file    Emotionally abused: Not on file    Physically abused: Not on file    Forced sexual activity: Not on file  Other Topics Concern  . 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 [place]   right  . BREAST EXCISIONAL BIOPSY Left 2014   benign  . BREAST LUMPECTOMY WITH NEEDLE LOCALIZATION Left 09/09/2013   Procedure: BREAST LUMPECTOMY WITH NEEDLE LOCALIZATION;  Surgeon: Imogene Burn. Georgette Dover, MD;  Location: [facility];  Service: General;  Laterality: Left;  . COLONOSCOPY    . POLYPECTOMY    . ROBOTIC ASSISTED LAPAROSCOPIC CHOLECYSTECTOMY  09/09/2019    Past Medical Hx:  Past Medical History:  Diagnosis Date  .  Adenomatous colon polyp   . Allergic rhinitis, seasonal   . Allergy   . Beta thalassemia trait 11/25/2013  . Cholelithiasis   . Class 3 obesity without serious comorbidity with body mass index (BMI) of 40.0 to 44.9 in adult 11/19/2012  . GERD (gastroesophageal reflux disease)   . HLD (hyperlipidemia)   . Hypercholesterolemia   . Hypertension 03/18/2017   no meds   . Intraductal papilloma of left breast    Patient underwent left needle-localized lumpectomy by Dr. Imogene Burn. [place] on 09/09/2013; pathology showed intraductal papilloma with no atypia or malignancy identified.  Marland Kitchen PONV (postoperative nausea and vomiting)   . Pre-diabetes    pt denies  . Uterine fibroid     Past Gynecological History:  See HPI No LMP recorded. Patient is postmenopausal.  Family Hx:  Family History  Problem Relation Age of Onset  . Diabetes Mother   . Hypertension Mother   . Colon polyps Mother 64  . Dementia Mother 71  . Diabetes Father   . Congestive Heart Failure Father    . Pancreatic cancer Paternal Aunt   . Colon cancer Neg Hx   . Breast cancer Neg Hx   . Lung cancer Neg Hx   . Esophageal cancer Neg Hx   . Rectal cancer Neg Hx   . Stomach cancer Neg Hx     Review of Systems:  Constitutional  Feels well,    ENT Normal appearing ears and nares bilaterally Skin/Breast  No rash, sores, jaundice, itching, dryness Cardiovascular  No chest pain, shortness of breath, or edema  Pulmonary  No cough or wheeze.  Gastro Intestinal  No nausea, vomitting, or diarrhoea. No bright red blood per rectum, no abdominal pain, change in bowel movement, or constipation.  Genito Urinary  No frequency, urgency, dysuria, + postmenopausal bleeding Musculo Skeletal  No myalgia, arthralgia, joint swelling or pain  Neurologic  No weakness, numbness, change in gait,  Psychology  No depression, anxiety, insomnia.   Vitals:  Blood pressure (!) 142/87, pulse 82, temperature 98.5 F (36.9 C), temperature source Temporal, resp. rate 18, height 5\' 4"  (1.626 m), weight 214 lb 4 oz (97.2 kg), SpO2 100 %.  Physical Exam: WD in NAD Neck  Supple NROM, without any enlargements.  Lymph Node Survey No cervical supraclavicular or inguinal adenopathy Cardiovascular  Pulse normal rate, regularity and rhythm. S1 and S2 normal.  Lungs  Clear to auscultation bilateraly, without wheezes/crackles/rhonchi. Good air movement.  Skin  No rash/lesions/breakdown  Psychiatry  Alert and oriented to person, place, and time  Abdomen  Normoactive bowel sounds, abdomen soft, non-tender and obese (BMI 36) without evidence of hernia.  Back No CVA tenderness Genito Urinary  Vulva/vagina: Normal external female genitalia.  No lesions. No discharge or bleeding.  Bladder/urethra:  No lesions or masses, well supported bladder  Vagina: normal  Cervix: Normal appearing, no lesions.  Uterus: Bulky, 16cmm mnimally mobile, no parametrial involvement or nodularity.  Adnexa: no palpable  masses. Rectal  Good tone, no masses no cul de sac nodularity.  Extremities  No bilateral cyanosis, clubbing or edema.   Thereasa Solo, MD  10/09/2019, 9:31 AM

## 2019-10-09 NOTE — Patient Instructions (Signed)
You will need to have a CT scan of the abdomen and pelvis prior to surgery and we will call you with the results.  Preparing for your Surgery  Plan for surgery on November 19, 2019 with Dr. Everitt Amber at [facility] will be scheduled for a robotic assisted total laparoscopic hysterectomy, bilateral salpingo-oophorectomy, sentinel lymph node biopsy, mini laparotomy for specimen delivery.   Pre-operative Testing -You will receive a phone call from presurgical testing at [facility] if you have not received a call already to arrange for a pre-operative testing appointment before your surgery.  This appointment normally occurs one to two weeks before your scheduled surgery.   -Bring your insurance card, copy of an advanced directive if applicable, medication list  -At that visit, you will be asked to sign a consent for a possible blood transfusion in case a transfusion becomes necessary during surgery.  The need for a blood transfusion is rare but having consent is a necessary part of your care.     -You should not be taking blood thinners or aspirin at least ten days prior to surgery unless instructed by your surgeon.  -Do not take supplements such as fish oil (omega 3), red yeast rice, tumeric before your surgery.  Day Before Surgery at [place] will be asked to take in a light diet the day before surgery.  Avoid carbonated beverages.  You will be advised to have nothing to eat or drink after midnight the evening before.    Eat a light diet the day before surgery.  Examples including soups, broths, toast, yogurt, mashed potatoes.  Things to avoid include carbonated beverages (fizzy beverages), raw fruits and raw vegetables, or beans.   If your bowels are filled with gas, your surgeon will have difficulty visualizing your pelvic organs which increases your surgical risks.  Your role in recovery Your role is to become active as soon as directed by your doctor, while still  giving yourself time to heal.  Rest when you feel tired. You will be asked to do the following in order to speed your recovery:  - Cough and breathe deeply. This helps toclear and expand your lungs and can prevent pneumonia.  - Do mild physical activity. Walking or moving your legs help your circulation and body functions return to normal. A staff member will help you when you try to walk and will provide you with simple exercises. Do not try to get up or walk alone the first time. - Actively manage your pain. Managing your pain lets you move in comfort. We will ask you to rate your pain on a scale of zero to 10. It is your responsibility to tell your doctor or nurse where and how much you hurt so your pain can be treated.  Special Considerations -If you are diabetic, you may be placed on insulin after surgery to have closer control over your blood sugars to promote healing and recovery.  This does not mean that you will be discharged on insulin.  If applicable, your oral antidiabetics will be resumed when you are tolerating a solid diet.  -Your final pathology results from surgery should be available around one week after surgery and the results will be relayed to you when available.  -Dr. Lahoma Crocker is the surgeon that assists your GYN Oncologist with surgery.  If you end up staying the night, the next day after your surgery you will either see Dr. Denman George or Dr. Lahoma Crocker.  -  FMLA forms can be faxed to [phone] and please allow 5-7 business days for completion.  Pain Management After Surgery -You have been prescribed your pain medication and bowel regimen medications before surgery so that you can have these available when you are discharged from the hospital. The pain medication is for use ONLY AFTER surgery and a new prescription will not be given.   -Make sure that you have Tylenol and Ibuprofen at home to use on a regular basis after surgery for pain control. We recommend  alternating the medications every hour to six hours since they work differently and are processed in the body differently for pain relief.  -Review the attached handout on narcotic use and their risks and side effects.   Bowel Regimen -You have been prescribed Sennakot-S to take nightly to prevent constipation especially if you are taking the narcotic pain medication intermittently.  It is important to prevent constipation and drink adequate amounts of liquids.   Blood Transfusion Information WHAT IS A BLOOD TRANSFUSION? A transfusion is the replacement of blood or some of its parts. Blood is made up of multiple cells which provide different functions.  Red blood cells carry oxygen and are used for blood loss replacement.  White blood cells fight against infection.  Platelets control bleeding.  Plasma helps clot blood.  Other blood products are available for specialized needs, such as hemophilia or other clotting disorders. BEFORE THE TRANSFUSION  Who gives blood for transfusions?   You may be able to donate blood to be used at a later date on yourself (autologous donation).  Relatives can be asked to donate blood. This is generally not any safer than if you have received blood from a stranger. The same precautions are taken to ensure safety when a relative's blood is donated.  Healthy volunteers who are fully evaluated to make sure their blood is safe. This is blood bank blood. Transfusion therapy is the safest it has ever been in the practice of medicine. Before blood is taken from a donor, a complete history is taken to make sure that person has no history of diseases nor engages in risky social behavior (examples are intravenous drug use or sexual activity with multiple partners). The donor's travel history is screened to minimize risk of transmitting infections, such as malaria. The donated blood is tested for signs of infectious diseases, such as HIV and hepatitis. The blood is  then tested to be sure it is compatible with you in order to minimize the chance of a transfusion reaction. If you or a relative donates blood, this is often done in anticipation of surgery and is not appropriate for emergency situations. It takes many days to process the donated blood. RISKS AND COMPLICATIONS Although transfusion therapy is very safe and saves many lives, the main dangers of transfusion include:   Getting an infectious disease.  Developing a transfusion reaction. This is an allergic reaction to something in the blood you were given. Every precaution is taken to prevent this. The decision to have a blood transfusion has been considered carefully by your caregiver before blood is given. Blood is not given unless the benefits outweigh the risks.  AFTER SURGERY INSTRUCTIONS 10/09/2019  Return to work: 4-6 weeks if applicable  Activity: 1. Be up and out of the bed during the day.  Take a nap if needed.  You may walk up steps but be careful and use the hand rail.  Stair climbing will tire you more than you think, you  may need to stop part way and rest.   2. No lifting or straining for 6 weeks.  3. No driving for 1 week(s).  Do not drive if you are taking narcotic pain medicine.  4. Shower daily.  Use soap and water on your incision and pat dry; don't rub.  No tub baths until cleared by your surgeon.   5. No sexual activity and nothing in the vagina for 8 weeks.  6. You may experience a small amount of clear drainage from your incisions, which is normal.  If the drainage persists or increases, please call the office.  7. You may experience vaginal spotting after surgery or around the 6-8 week mark from surgery when the stitches at the top of the vagina begin to dissolve.  The spotting is normal but if you experience heavy bleeding, call our office.  8. Take Tylenol or ibuprofen first for pain and only use narcotic pain medication for severe pain not relieved by the Tylenol or  Ibuprofen.  Monitor your Tylenol intake to a max of 4,000 mg.  Diet: 1. Low sodium Heart Healthy Diet is recommended.  2. It is safe to use a laxative, such as Miralax or Colace, if you have difficulty moving your bowels. You can take Sennakot at bedtime every evening to keep bowel movements regular and to prevent constipation.    Wound Care: 1. Keep clean and dry.  Shower daily.  Reasons to call the Doctor:  Fever - Oral temperature greater than 100.4 degrees Fahrenheit  Foul-smelling vaginal discharge  Difficulty urinating  Nausea and vomiting  Increased pain at the site of the incision that is unrelieved with pain medicine.  Difficulty breathing with or without chest pain  New calf pain especially if only on one side  Sudden, continuing increased vaginal bleeding with or without clots.   Contacts: For questions or concerns you should contact:  Dr. Everitt Amber at [phone]  Joylene John, NP at [phone]  After Hours: call [phone] and have the GYN Oncologist paged/contacted

## 2019-10-09 NOTE — Progress Notes (Signed)
Consult Note: Gyn-Onc  Consult was requested by Dr. Ihor Dow for the evaluation of Tina Stone 69 y.o. female  CC:  Chief Complaint  Patient presents with  . endometrial ca    Assessment/Plan:  Ms. Lauraine Desruisseaux  is a 69 y.o.  year old with grade 3 endometrial cancer and a fibroid uterus.   We will obtain a CT chest/abd/pelvis in accordance with NCCN guidelines to determine if there is gross metastatic disease that would change surgical planning.  A detailed discussion was held with the patient and her family with regard to to her endometrial cancer diagnosis. We discussed the standard management options for uterine cancer which includes surgery followed possibly by adjuvant therapy depending on the results of surgery. The options for surgical management include a hysterectomy and removal of the tubes and ovaries possibly with removal of pelvic and para-aortic lymph nodes.If feasible, a minimally invasive approach including a robotic hysterectomy or laparoscopic hysterectomy have benefits including shorter hospital stay, recovery time and better wound healing than with open surgery. The patient has been counseled about these surgical options and the risks of surgery in general including infection, bleeding, damage to surrounding structures (including bowel, bladder, ureters, nerves or vessels), and the postoperative risks of PE/ DVT, and lymphedema. I extensively reviewed the additional risks of robotic hysterectomy including possible need for conversion to open laparotomy.  I discussed positioning during surgery of trendelenberg and risks of minor facial swelling and care we take in preoperative positioning.  After counseling and consideration of her options, she desires to proceed with robotic assisted total hysterectomy with bilateral sapingo-oophorectomy and SLN biopsy. She will require a minilap for specimen delivery due to the large size of her uterus.  She will be seen  by anesthesia for preoperative clearance and discussion of postoperative pain management.  She was given the opportunity to ask questions, which were answered to her satisfaction, and she is agreement with the above mentioned plan of care.   HPI: [name] is a 69 year old P0 who is seen in consultation at the request of Dr Ihor Dow for grade 3 poorly differentiated endometrial cancer.  The patient reported a history of postmenopausal bleeding that began 1 to 2 months before diagnosis.  She presented for a transvaginal ultrasound scan on September 23, 2019 which revealed a fibroid uterus measuring 6.8 x 7.3 x 14.5 cm.  The endometrium was thickened at 6.2 mm.  The left and ovary and right ovaries were not seen.  This prompted a endometrial biopsy to be performed on the same day, 09/23/2019, which revealed high-grade poorly differentiated carcinoma consistent with FIGO grade 3.  The patient has had a recent history of a robotic cholecystectomy in September 2020 with Dr. Josie Saunders.  This was an uncomplicated procedure.  She otherwise has not had any abdominal surgery.  Her gynecologic history is significant for her being nulliparous, she has never had surgical procedures for her fibroid uterus.  She transition through menopause in her 69s.  She continues to have intermittent menopausal symptoms.  Her medical history is otherwise remarkable for hypertension and gastroesophageal reflux disease.  Her primary care physician is Dr. Luetta Nutting.  Her family history is unremarkable for GYN or colonic malignancies.  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  Current Meds:  Outpatient Encounter Medications as of 10/09/2019  Medication Sig  . APPLE CIDER VINEGAR PO Take 5 mLs by mouth daily.   Marland Kitchen  chlorthalidone (HYGROTON) 25 MG tablet Take 0.5 tablets (12.5 mg total) by mouth daily.  . [DISCONTINUED] docusate sodium (COLACE) 100 MG capsule Take 1 capsule  (100 mg total) by mouth 2 (two) times daily. (Patient not taking: Reported on 10/02/2019)  . [DISCONTINUED] Multiple Vitamin (MULTIVITAMIN WITH MINERALS) TABS tablet Take 1 tablet by mouth daily.  . [DISCONTINUED] traMADol (ULTRAM) 50 MG tablet Take 1 tablet (50 mg total) by mouth every 6 (six) hours as needed (For pain not relieved by tylenol/ ibuprofen).   No facility-administered encounter medications on file as of 10/09/2019.     Allergy:  Allergies  Allergen Reactions  . Benicar Hct [Olmesartan Medoxomil-Hctz] Shortness Of Breath and Palpitations  . Bactrim [Sulfamethoxazole-Trimethoprim] Other (See Comments)    Abdominal pain, dizziness  . Pravastatin Other (See Comments)    Lower abdominal pain  . Shellfish Allergy Nausea And Vomiting  . Amlodipine Palpitations    Social Hx:   Social History   Socioeconomic History  . Marital status: Widowed    Spouse name: Not on file  . Number of children: Not on file  . Years of education: Not on file  . Highest education level: Not on file  Occupational History  . Not on file  Social Needs  . Financial resource strain: Not on file  . Food insecurity    Worry: Not on file    Inability: Not on file  . Transportation needs    Medical: Not on file    Non-medical: Not on file  Tobacco Use  . Smoking status: Former Research scientist (life sciences)  . Smokeless tobacco: Never Used  . Tobacco comment: few puffs but not a true smoker quit many yrs ago  Substance and Sexual Activity  . Alcohol use: Never    Frequency: Never  . Drug use: Never  . Sexual activity: Not on file  Lifestyle  . Physical activity    Days per week: Not on file    Minutes per session: Not on file  . Stress: Not on file  Relationships  . Social Herbalist on phone: Not on file    Gets together: Not on file    Attends religious service: Not on file    Active member of club or organization: Not on file    Attends meetings of clubs or organizations: Not on file     Relationship status: Not on file  . Intimate partner violence    Fear of current or ex partner: Not on file    Emotionally abused: Not on file    Physically abused: Not on file    Forced sexual activity: Not on file  Other Topics Concern  . 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 [place]   right  . BREAST EXCISIONAL BIOPSY Left 2014   benign  . BREAST LUMPECTOMY WITH NEEDLE LOCALIZATION Left 09/09/2013   Procedure: BREAST LUMPECTOMY WITH NEEDLE LOCALIZATION;  Surgeon: Imogene Burn. Georgette Dover, MD;  Location: [facility];  Service: General;  Laterality: Left;  . COLONOSCOPY    . POLYPECTOMY    . ROBOTIC ASSISTED LAPAROSCOPIC CHOLECYSTECTOMY  09/09/2019    Past Medical Hx:  Past Medical History:  Diagnosis Date  .  Adenomatous colon polyp   . Allergic rhinitis, seasonal   . Allergy   . Beta thalassemia trait 11/25/2013  . Cholelithiasis   . Class 3 obesity without serious comorbidity with body mass index (BMI) of 40.0 to 44.9 in adult 11/19/2012  . GERD (gastroesophageal reflux disease)   . HLD (hyperlipidemia)   . Hypercholesterolemia   . Hypertension 03/18/2017   no meds   . Intraductal papilloma of left breast    Patient underwent left needle-localized lumpectomy by Dr. Imogene Burn. [place] on 09/09/2013; pathology showed intraductal papilloma with no atypia or malignancy identified.  Marland Kitchen PONV (postoperative nausea and vomiting)   . Pre-diabetes    pt denies  . Uterine fibroid     Past Gynecological History:  See HPI No LMP recorded. Patient is postmenopausal.  Family Hx:  Family History  Problem Relation Age of Onset  . Diabetes Mother   . Hypertension Mother   . Colon polyps Mother 47  . Dementia Mother 65  . Diabetes Father   . Congestive Heart Failure Father    . Pancreatic cancer Paternal Aunt   . Colon cancer Neg Hx   . Breast cancer Neg Hx   . Lung cancer Neg Hx   . Esophageal cancer Neg Hx   . Rectal cancer Neg Hx   . Stomach cancer Neg Hx     Review of Systems:  Constitutional  Feels well,    ENT Normal appearing ears and nares bilaterally Skin/Breast  No rash, sores, jaundice, itching, dryness Cardiovascular  No chest pain, shortness of breath, or edema  Pulmonary  No cough or wheeze.  Gastro Intestinal  No nausea, vomitting, or diarrhoea. No bright red blood per rectum, no abdominal pain, change in bowel movement, or constipation.  Genito Urinary  No frequency, urgency, dysuria, + postmenopausal bleeding Musculo Skeletal  No myalgia, arthralgia, joint swelling or pain  Neurologic  No weakness, numbness, change in gait,  Psychology  No depression, anxiety, insomnia.   Vitals:  Blood pressure (!) 142/87, pulse 82, temperature 98.5 F (36.9 C), temperature source Temporal, resp. rate 18, height 5\' 4"  (1.626 m), weight 214 lb 4 oz (97.2 kg), SpO2 100 %.  Physical Exam: WD in NAD Neck  Supple NROM, without any enlargements.  Lymph Node Survey No cervical supraclavicular or inguinal adenopathy Cardiovascular  Pulse normal rate, regularity and rhythm. S1 and S2 normal.  Lungs  Clear to auscultation bilateraly, without wheezes/crackles/rhonchi. Good air movement.  Skin  No rash/lesions/breakdown  Psychiatry  Alert and oriented to person, place, and time  Abdomen  Normoactive bowel sounds, abdomen soft, non-tender and obese (BMI 36) without evidence of hernia.  Back No CVA tenderness Genito Urinary  Vulva/vagina: Normal external female genitalia.  No lesions. No discharge or bleeding.  Bladder/urethra:  No lesions or masses, well supported bladder  Vagina: normal  Cervix: Normal appearing, no lesions.  Uterus: Bulky, 16cmm mnimally mobile, no parametrial involvement or nodularity.  Adnexa: no palpable  masses. Rectal  Good tone, no masses no cul de sac nodularity.  Extremities  No bilateral cyanosis, clubbing or edema.   Thereasa Solo, MD  10/09/2019, 9:31 AM

## 2019-10-15 ENCOUNTER — Encounter (HOSPITAL_COMMUNITY): Payer: Self-pay | Admitting: Radiology

## 2019-10-15 ENCOUNTER — Other Ambulatory Visit: Payer: Self-pay

## 2019-10-15 ENCOUNTER — Ambulatory Visit (HOSPITAL_COMMUNITY)
Admission: RE | Admit: 2019-10-15 | Discharge: 2019-10-15 | Disposition: A | Discharge status: Home / Self Care | Payer: Medicare Other | Source: Ambulatory Visit | Attending: Gynecologic Oncology | Admitting: Gynecologic Oncology

## 2019-10-15 DIAGNOSIS — D252 Subserosal leiomyoma of uterus: Secondary | ICD-10-CM | POA: Diagnosis present

## 2019-10-15 DIAGNOSIS — D250 Submucous leiomyoma of uterus: Secondary | ICD-10-CM

## 2019-10-15 DIAGNOSIS — C541 Malignant neoplasm of endometrium: Secondary | ICD-10-CM | POA: Diagnosis not present

## 2019-10-15 DIAGNOSIS — D251 Intramural leiomyoma of uterus: Secondary | ICD-10-CM | POA: Diagnosis present

## 2019-10-15 MED ORDER — SODIUM CHLORIDE (PF) 0.9 % IJ SOLN
INTRAMUSCULAR | Status: AC
  Filled 2019-10-15: qty 50

## 2019-10-15 MED ORDER — IOHEXOL 300 MG/ML  SOLN
100.0000 mL | Freq: Once | INTRAMUSCULAR | Status: AC | PRN
  Administered 2019-10-15: 100 mL via INTRAVENOUS

## 2019-10-16 ENCOUNTER — Telehealth: Payer: Self-pay

## 2019-10-16 NOTE — Telephone Encounter (Signed)
Told Ms Minteer that the CT scan showed no evidence of gross spread of the cancer per Joylene John, NP. Pt verbalized understanding.

## 2019-10-19 ENCOUNTER — Other Ambulatory Visit: Payer: Self-pay | Admitting: *Deleted

## 2019-10-19 ENCOUNTER — Inpatient Hospital Stay (HOSPITAL_COMMUNITY): Admission: RE | Admit: 2019-10-19 | Payer: Medicare Other | Source: Ambulatory Visit

## 2019-10-19 DIAGNOSIS — Z20822 Contact with and (suspected) exposure to covid-19: Secondary | ICD-10-CM

## 2019-10-20 ENCOUNTER — Telehealth: Payer: Self-pay | Admitting: *Deleted

## 2019-10-20 NOTE — Progress Notes (Signed)
PCP - Luetta Nutting Cardiologist -   Chest x-ray - 02-11-19 and ct chest 10-15-19 epic EKG - 02-07-19 and 3 -6-20 epic Stress Test -  ECHO -  Cardiac Cath -   Sleep Study -  CPAP -   Fasting Blood Sugar -  Checks Blood Sugar _____ times a day  Blood Thinner Instructions: Aspirin Instructions: Last Dose:  Anesthesia review:   Patient denies shortness of breath, fever, cough and chest pain at PAT appointment   Patient verbalized understanding of instructions that were given to them at the PAT appointment. Patient was also instructed that they will need to review over the PAT instructions again at home before surgery.

## 2019-10-20 NOTE — Patient Instructions (Signed)
DUE TO COVID-19 ONLY ONE VISITOR IS ALLOWED TO COME WITH YOU AND STAY IN THE WAITING ROOM ONLY DURING PRE OP AND PROCEDURE DAY OF SURGERY. THE 1 VISITOR MAY VISIT WITH YOU AFTER SURGERY IN YOUR PRIVATE ROOM DURING VISITING HOURS ONLY!  YOU NEED TO HAVE A COVID 19 TEST ON_______ @_______ , THIS TEST MUST BE DONE BEFORE SURGERY, COME  [address], [place] , [address].  ([place]) ONCE YOUR COVID TEST IS COMPLETED, PLEASE BEGIN THE QUARANTINE INSTRUCTIONS AS OUTLINED IN YOUR HANDOUT.                [place]  10/20/2019   Your procedure is scheduled on: 10-21-09   Report to [facility] Main  Entrance   Report to admitting at            1100 AM     Call this number if you have problems the morning of surgery [phone]    Remember:  Eat a light diet the day before surgery.  Examples including soups, broths, toast, yogurt, mashed potatoes.  Things to avoid include carbonated beverages (fizzy beverages), raw fruits and raw vegetables, or beans. .     If your bowels are filled with gas, your surgeon will have difficulty visualizing your pelvic organs which increases your surgical risks.    NO SOLID FOOD AFTER MIDNIGHT YOU MAY HAVE CLEAR LIQUIDS UNTIL 1000  AM THEN NOTHING BY MOUTH    CLEAR LIQUID DIET   Foods Allowed                                                                     Foods Excluded  Coffee and tea, regular and decaf                             liquids that you cannot  Plain Jell-O any favor except red or purple                                           see through such as: Fruit ices (not with fruit pulp)                                     milk, soups, orange juice  Iced Popsicles                                    All solid food                                    Cranberry, grape and apple juices Sports drinks like Gatorade Lightly seasoned clear broth or consume(fat free) Sugar, honey syrup                       ____________________________________________________________________   BRUSH YOUR TEETH MORNING OF SURGERY AND RINSE YOUR MOUTH  OUT, NO CHEWING GUM CANDY OR MINTS.     Take these medicines the morning of surgery with A SIP OF WATER: none                                 You may not have any metal on your body including hair pins and              piercings  Do not wear jewelry, make-up, lotions, powders or perfumes, deodorant             Do not wear nail polish on your fingernails.  Do not shave  48 hours prior to surgery.     Do not bring valuables to the hospital. [place].  Contacts, dentures or bridgework may not be worn into surgery.      Patients discharged the day of surgery will not be allowed to drive home. IF YOU ARE HAVING SURGERY AND GOING HOME THE SAME DAY, YOU MUST HAVE AN ADULT TO DRIVE YOU HOME AND BE WITH YOU FOR 24 HOURS. YOU MAY GO HOME BY TAXI OR UBER OR ORTHERWISE, BUT AN ADULT MUST ACCOMPANY YOU HOME AND STAY WITH YOU FOR 24 HOURS.  Name and phone number of your driver:  Special Instructions: N/A              Please read over the following fact sheets you were given: _____________________________________________________________________             [facility] - Preparing for Surgery Before surgery, you can play an important role.  Because skin is not sterile, your skin needs to be as free of germs as possible.  You can reduce the number of germs on your skin by washing with CHG (chlorahexidine gluconate) soap before surgery.  CHG is an antiseptic cleaner which kills germs and bonds with the skin to continue killing germs even after washing. Please DO NOT use if you have an allergy to CHG or antibacterial soaps.  If your skin becomes reddened/irritated stop using the CHG and inform your nurse when you arrive at Short Stay. Do not shave (including legs and underarms) for at least 48 hours prior  to the first CHG shower.  You may shave your face/neck. Please follow these instructions carefully:  1.  Shower with CHG Soap the night before surgery and the  morning of Surgery.  2.  If you choose to wash your hair, wash your hair first as usual with your  normal  shampoo.  3.  After you shampoo, rinse your hair and body thoroughly to remove the  shampoo.                           4.  Use CHG as you would any other liquid soap.  You can apply chg directly  to the skin and wash                       Gently with a scrungie or clean washcloth.  5.  Apply the CHG Soap to your body ONLY FROM THE NECK DOWN.   Do not use on face/ open  Wound or open sores. Avoid contact with eyes, ears mouth and genitals (private parts).                       Wash face,  Genitals (private parts) with your normal soap.             6.  Wash thoroughly, paying special attention to the area where your surgery  will be performed.  7.  Thoroughly rinse your body with warm water from the neck down.  8.  DO NOT shower/wash with your normal soap after using and rinsing off  the CHG Soap.                9.  Pat yourself dry with a clean towel.            10.  Wear clean pajamas.            11.  Place clean sheets on your bed the night of your first shower and do not  sleep with pets. Day of Surgery : Do not apply any lotions/deodorants the morning of surgery.  Please wear clean clothes to the hospital/surgery center.  FAILURE TO FOLLOW THESE INSTRUCTIONS MAY RESULT IN THE CANCELLATION OF YOUR SURGERY PATIENT SIGNATURE_________________________________  NURSE SIGNATURE__________________________________  ________________________________________________________________________   Tina Stone  An incentive spirometer is a tool that can help keep your lungs clear and active. This tool measures how well you are filling your lungs with each breath. Taking long deep breaths may help reverse or  decrease the chance of developing breathing (pulmonary) problems (especially infection) following:  A long period of time when you are unable to move or be active. BEFORE THE PROCEDURE   If the spirometer includes an indicator to show your best effort, your nurse or respiratory therapist will set it to a desired goal.  If possible, sit up straight or lean slightly forward. Try not to slouch.  Hold the incentive spirometer in an upright position. INSTRUCTIONS FOR USE  1. Sit on the edge of your bed if possible, or sit up as far as you can in bed or on a chair. 2. Hold the incentive spirometer in an upright position. 3. Breathe out normally. 4. Place the mouthpiece in your mouth and seal your lips tightly around it. 5. Breathe in slowly and as deeply as possible, raising the piston or the ball toward the top of the column. 6. Hold your breath for 3-5 seconds or for as long as possible. Allow the piston or ball to fall to the bottom of the column. 7. Remove the mouthpiece from your mouth and breathe out normally. 8. Rest for a few seconds and repeat Steps 1 through 7 at least 10 times every 1-2 hours when you are awake. Take your time and take a few normal breaths between deep breaths. 9. The spirometer may include an indicator to show your best effort. Use the indicator as a goal to work toward during each repetition. 10. After each set of 10 deep breaths, practice coughing to be sure your lungs are clear. If you have an incision (the cut made at the time of surgery), support your incision when coughing by placing a pillow or rolled up towels firmly against it. Once you are able to get out of bed, walk around indoors and cough well. You may stop using the incentive spirometer when instructed by your caregiver.  RISKS AND COMPLICATIONS  Take your time so you do not get  dizzy or light-headed.  If you are in pain, you may need to take or ask for pain medication before doing incentive spirometry.  It is harder to take a deep breath if you are having pain. AFTER USE  Rest and breathe slowly and easily.  It can be helpful to keep track of a log of your progress. Your caregiver can provide you with a simple table to help with this. If you are using the spirometer at home, follow these instructions: [place] IF:   You are having difficultly using the spirometer.  You have trouble using the spirometer as often as instructed.  Your pain medication is not giving enough relief while using the spirometer.  You develop fever of 100.5 F (38.1 C) or higher. SEEK IMMEDIATE MEDICAL CARE IF:   You cough up bloody sputum that had not been present before.  You develop fever of 102 F (38.9 C) or greater.  You develop worsening pain at or near the incision site. MAKE SURE YOU:   Understand these instructions.  Will watch your condition.  Will get help right away if you are not doing well or get worse. Document Released: 04/29/2007 Document Revised: 03/10/2012 Document Reviewed: 06/30/2007 Exit[employer] Patient Information 2014 [employer], [place].   ________________________________________________________________________  WHAT IS A BLOOD TRANSFUSION? Blood Transfusion Information  A transfusion is the replacement of blood or some of its parts. Blood is made up of multiple cells which provide different functions.  Red blood cells carry oxygen and are used for blood loss replacement.  White blood cells fight against infection.  Platelets control bleeding.  Plasma helps clot blood.  Other blood products are available for specialized needs, such as hemophilia or other clotting disorders. BEFORE THE TRANSFUSION  Who gives blood for transfusions?   Healthy volunteers who are fully evaluated to make sure their blood is safe. This is blood bank blood. Transfusion therapy is the safest it has ever been in the practice of medicine. Before blood is taken from a donor, a complete  history is taken to make sure that person has no history of diseases nor engages in risky social behavior (examples are intravenous drug use or sexual activity with multiple partners). The donor's travel history is screened to minimize risk of transmitting infections, such as malaria. The donated blood is tested for signs of infectious diseases, such as HIV and hepatitis. The blood is then tested to be sure it is compatible with you in order to minimize the chance of a transfusion reaction. If you or a relative donates blood, this is often done in anticipation of surgery and is not appropriate for emergency situations. It takes many days to process the donated blood. RISKS AND COMPLICATIONS Although transfusion therapy is very safe and saves many lives, the main dangers of transfusion include:   Getting an infectious disease.  Developing a transfusion reaction. This is an allergic reaction to something in the blood you were given. Every precaution is taken to prevent this. The decision to have a blood transfusion has been considered carefully by your caregiver before blood is given. Blood is not given unless the benefits outweigh the risks. AFTER THE TRANSFUSION  Right after receiving a blood transfusion, you will usually feel much better and more energetic. This is especially true if your red blood cells have gotten low (anemic). The transfusion raises the level of the red blood cells which carry oxygen, and this usually causes an energy increase.  The nurse administering the transfusion will  monitor you carefully for complications. HOME CARE INSTRUCTIONS  No special instructions are needed after a transfusion. You may find your energy is better. Speak with your caregiver about any limitations on activity for underlying diseases you may have. SEEK MEDICAL CARE IF:   Your condition is not improving after your transfusion.  You develop redness or irritation at the intravenous (IV) site. SEEK  IMMEDIATE MEDICAL CARE IF:  Any of the following symptoms occur over the next 12 hours:  Shaking chills.  You have a temperature by mouth above 102 F (38.9 C), not controlled by medicine.  Chest, back, or muscle pain.  People around you feel you are not acting correctly or are confused.  Shortness of breath or difficulty breathing.  Dizziness and fainting.  You get a rash or develop hives.  You have a decrease in urine output.  Your urine turns a dark color or changes to pink, red, or brown. Any of the following symptoms occur over the next 10 days:  You have a temperature by mouth above 102 F (38.9 C), not controlled by medicine.  Shortness of breath.  Weakness after normal activity.  The white part of the eye turns yellow (jaundice).  You have a decrease in the amount of urine or are urinating less often.  Your urine turns a dark color or changes to pink, red, or brown. Document Released: 12/14/2000 Document Revised: 03/10/2012 Document Reviewed: 08/02/2008 [facility] Patient Information 2014 [place], [place].  _______________________________________________________________________

## 2019-10-20 NOTE — Telephone Encounter (Signed)
Received a message from per admission testing, patient no showed for her covid test yesterday. Testing site closed for today, per admission testing will send patient tomorrow after her appt here. Message forwarded to [facility] APP

## 2019-10-21 ENCOUNTER — Encounter (HOSPITAL_COMMUNITY): Payer: Self-pay

## 2019-10-21 ENCOUNTER — Encounter (HOSPITAL_COMMUNITY)
Admission: RE | Admit: 2019-10-21 | Discharge: 2019-10-21 | Disposition: A | Discharge status: Home / Self Care | Payer: Medicare Other | Source: Ambulatory Visit | Attending: Gynecologic Oncology | Admitting: Gynecologic Oncology

## 2019-10-21 ENCOUNTER — Other Ambulatory Visit (HOSPITAL_COMMUNITY)
Admission: RE | Admit: 2019-10-21 | Discharge: 2019-10-21 | Disposition: A | Discharge status: Home / Self Care | Payer: Medicare Other | Source: Ambulatory Visit | Attending: Gynecologic Oncology | Admitting: Gynecologic Oncology

## 2019-10-21 ENCOUNTER — Other Ambulatory Visit: Payer: Self-pay

## 2019-10-21 ENCOUNTER — Telehealth: Payer: Self-pay | Admitting: Oncology

## 2019-10-21 DIAGNOSIS — Z87891 Personal history of nicotine dependence: Secondary | ICD-10-CM | POA: Insufficient documentation

## 2019-10-21 DIAGNOSIS — E7800 Pure hypercholesterolemia, unspecified: Secondary | ICD-10-CM | POA: Insufficient documentation

## 2019-10-21 DIAGNOSIS — Z01812 Encounter for preprocedural laboratory examination: Secondary | ICD-10-CM | POA: Diagnosis not present

## 2019-10-21 DIAGNOSIS — Z20828 Contact with and (suspected) exposure to other viral communicable diseases: Secondary | ICD-10-CM | POA: Diagnosis not present

## 2019-10-21 DIAGNOSIS — C541 Malignant neoplasm of endometrium: Secondary | ICD-10-CM | POA: Insufficient documentation

## 2019-10-21 DIAGNOSIS — K219 Gastro-esophageal reflux disease without esophagitis: Secondary | ICD-10-CM | POA: Insufficient documentation

## 2019-10-21 DIAGNOSIS — I10 Essential (primary) hypertension: Secondary | ICD-10-CM | POA: Diagnosis not present

## 2019-10-21 LAB — COMPREHENSIVE METABOLIC PANEL
ALT: 14 U/L (ref 0–44)
AST: 20 U/L (ref 15–41)
Albumin: 3.7 g/dL (ref 3.5–5.0)
Alkaline Phosphatase: 81 U/L (ref 38–126)
Anion gap: 9 (ref 5–15)
BUN: 13 mg/dL (ref 8–23)
CO2: 29 mmol/L (ref 22–32)
Calcium: 9.2 mg/dL (ref 8.9–10.3)
Chloride: 98 mmol/L (ref 98–111)
Creatinine, Ser: 0.89 mg/dL (ref 0.44–1.00)
GFR calc Af Amer: >60 mL/min (ref >60)
GFR calc non Af Amer: >60 mL/min (ref >60)
Glucose, Bld: 93 mg/dL (ref 70–99)
Potassium: 3.7 mmol/L (ref 3.5–5.1)
Sodium: 136 mmol/L (ref 135–145)
Total Bilirubin: 0.5 mg/dL (ref 0.3–1.2)
Total Protein: 7.4 g/dL (ref 6.5–8.1)

## 2019-10-21 LAB — CBC
HCT: 39.9 % (ref 36.0–46.0)
Hemoglobin: 12.5 g/dL (ref 12.0–15.0)
MCH: 24.7 pg — ABNORMAL LOW (ref 26.0–34.0)
MCHC: 31.3 g/dL (ref 30.0–36.0)
MCV: 78.9 fL — ABNORMAL LOW (ref 80.0–100.0)
Platelets: 219 10*3/uL (ref 150–400)
RBC: 5.06 MIL/uL (ref 3.87–5.11)
RDW: 16.1 % — ABNORMAL HIGH (ref 11.5–15.5)
WBC: 4.3 10*3/uL (ref 4.0–10.5)
nRBC: 0 % (ref 0.0–0.2)

## 2019-10-21 LAB — URINALYSIS, ROUTINE W REFLEX MICROSCOPIC
Bilirubin Urine: NEGATIVE
Glucose, UA: NEGATIVE mg/dL
Ketones, ur: NEGATIVE mg/dL
Nitrite: NEGATIVE
Protein, ur: NEGATIVE mg/dL
Specific Gravity, Urine: 1.01 (ref 1.005–1.030)
pH: 7 (ref 5.0–8.0)

## 2019-10-21 LAB — SARS CORONAVIRUS 2 (TAT 6-24 HRS): SARS Coronavirus 2: NEGATIVE

## 2019-10-21 LAB — NOVEL CORONAVIRUS, NAA: SARS-CoV-2, NAA: NOT DETECTED

## 2019-10-21 LAB — URINALYSIS, MICROSCOPIC (REFLEX)

## 2019-10-21 LAB — HEMOGLOBIN A1C
Hgb A1c MFr Bld: 5.5 % (ref 4.8–5.6)
Mean Plasma Glucose: 111.15 mg/dL

## 2019-10-21 LAB — ABO/RH: ABO/RH(D): B NEG

## 2019-10-21 NOTE — Telephone Encounter (Signed)
Left a message for Tina Stone and requested a return call.

## 2019-10-21 NOTE — Telephone Encounter (Signed)
Tina Stone and discussed Pre-op for tomorrow.  She does not have any questions and knows to arrive at 11 am.

## 2019-10-22 ENCOUNTER — Ambulatory Visit (HOSPITAL_COMMUNITY): Payer: Medicare Other | Admitting: Registered Nurse

## 2019-10-22 ENCOUNTER — Ambulatory Visit (HOSPITAL_COMMUNITY)
Admission: RE | Admit: 2019-10-22 | Discharge: 2019-10-23 | Disposition: A | Discharge status: Home / Self Care | Payer: Medicare Other | Attending: Gynecologic Oncology | Admitting: Gynecologic Oncology

## 2019-10-22 ENCOUNTER — Encounter (HOSPITAL_COMMUNITY): Payer: Self-pay

## 2019-10-22 ENCOUNTER — Ambulatory Visit (HOSPITAL_COMMUNITY): Payer: Medicare Other

## 2019-10-22 ENCOUNTER — Encounter (HOSPITAL_COMMUNITY)
Admission: RE | Disposition: A | Discharge status: Home / Self Care | Payer: Self-pay | Source: Home / Self Care | Attending: Gynecologic Oncology

## 2019-10-22 DIAGNOSIS — K219 Gastro-esophageal reflux disease without esophagitis: Secondary | ICD-10-CM | POA: Insufficient documentation

## 2019-10-22 DIAGNOSIS — C541 Malignant neoplasm of endometrium: Secondary | ICD-10-CM | POA: Diagnosis present

## 2019-10-22 DIAGNOSIS — I10 Essential (primary) hypertension: Secondary | ICD-10-CM | POA: Diagnosis not present

## 2019-10-22 DIAGNOSIS — E785 Hyperlipidemia, unspecified: Secondary | ICD-10-CM | POA: Diagnosis not present

## 2019-10-22 DIAGNOSIS — Z8249 Family history of ischemic heart disease and other diseases of the circulatory system: Secondary | ICD-10-CM | POA: Diagnosis not present

## 2019-10-22 DIAGNOSIS — E7800 Pure hypercholesterolemia, unspecified: Secondary | ICD-10-CM | POA: Diagnosis not present

## 2019-10-22 DIAGNOSIS — Z888 Allergy status to other drugs, medicaments and biological substances status: Secondary | ICD-10-CM | POA: Diagnosis not present

## 2019-10-22 DIAGNOSIS — D259 Leiomyoma of uterus, unspecified: Secondary | ICD-10-CM | POA: Insufficient documentation

## 2019-10-22 DIAGNOSIS — Z79899 Other long term (current) drug therapy: Secondary | ICD-10-CM | POA: Diagnosis not present

## 2019-10-22 DIAGNOSIS — Z6836 Body mass index (BMI) 36.0-36.9, adult: Secondary | ICD-10-CM | POA: Insufficient documentation

## 2019-10-22 DIAGNOSIS — D649 Anemia, unspecified: Secondary | ICD-10-CM | POA: Diagnosis not present

## 2019-10-22 DIAGNOSIS — E66812 Obesity, class 2: Secondary | ICD-10-CM | POA: Diagnosis present

## 2019-10-22 DIAGNOSIS — Z87891 Personal history of nicotine dependence: Secondary | ICD-10-CM | POA: Diagnosis not present

## 2019-10-22 DIAGNOSIS — Z881 Allergy status to other antibiotic agents status: Secondary | ICD-10-CM | POA: Diagnosis not present

## 2019-10-22 DIAGNOSIS — E669 Obesity, unspecified: Secondary | ICD-10-CM | POA: Diagnosis present

## 2019-10-22 HISTORY — PX: SENTINEL NODE BIOPSY: SHX6608

## 2019-10-22 HISTORY — PX: ROBOTIC ASSISTED TOTAL HYSTERECTOMY WITH BILATERAL SALPINGO OOPHERECTOMY: SHX6086

## 2019-10-22 LAB — TYPE AND SCREEN
ABO/RH(D): B NEG
Antibody Screen: NEGATIVE

## 2019-10-22 SURGERY — HYSTERECTOMY, TOTAL, ROBOT-ASSISTED, LAPAROSCOPIC, WITH BILATERAL SALPINGO-OOPHORECTOMY
Anesthesia: General

## 2019-10-22 MED ORDER — KETAMINE HCL 10 MG/ML IJ SOLN
INTRAMUSCULAR | Status: DC | PRN
  Administered 2019-10-22: 30 mg via INTRAVENOUS

## 2019-10-22 MED ORDER — OXYCODONE HCL 5 MG PO TABS: 5.0000 mg | ORAL_TABLET | ORAL | Status: DC | PRN

## 2019-10-22 MED ORDER — PROMETHAZINE HCL 25 MG/ML IJ SOLN
INTRAMUSCULAR | Status: AC
  Filled 2019-10-22: qty 1

## 2019-10-22 MED ORDER — SCOPOLAMINE 1 MG/3DAYS TD PT72
1.0000 | MEDICATED_PATCH | TRANSDERMAL | Status: DC
  Administered 2019-10-22: 1.5 mg via TRANSDERMAL
  Filled 2019-10-22: qty 1

## 2019-10-22 MED ORDER — ALBUTEROL SULFATE HFA 108 (90 BASE) MCG/ACT IN AERS
INHALATION_SPRAY | RESPIRATORY_TRACT | Status: AC
  Filled 2019-10-22: qty 6.7

## 2019-10-22 MED ORDER — PROPOFOL 10 MG/ML IV BOLUS
INTRAVENOUS | Status: AC
  Filled 2019-10-22: qty 20

## 2019-10-22 MED ORDER — LACTATED RINGERS IR SOLN
Status: DC | PRN
  Administered 2019-10-22: 1

## 2019-10-22 MED ORDER — SUCCINYLCHOLINE CHLORIDE 200 MG/10ML IV SOSY
PREFILLED_SYRINGE | INTRAVENOUS | Status: AC
  Filled 2019-10-22: qty 10

## 2019-10-22 MED ORDER — FENTANYL CITRATE (PF) 100 MCG/2ML IJ SOLN
INTRAMUSCULAR | Status: AC
  Filled 2019-10-22: qty 2

## 2019-10-22 MED ORDER — LIDOCAINE 2% (20 MG/ML) 5 ML SYRINGE
INTRAMUSCULAR | Status: AC
  Filled 2019-10-22: qty 5

## 2019-10-22 MED ORDER — HYDROMORPHONE HCL 1 MG/ML IJ SOLN
INTRAMUSCULAR | Status: AC
  Filled 2019-10-22: qty 1

## 2019-10-22 MED ORDER — ACETAMINOPHEN 500 MG PO TABS
1000.0000 mg | ORAL_TABLET | Freq: Two times a day (BID) | ORAL | Status: DC
  Administered 2019-10-22 – 2019-10-23 (×2): 1000 mg via ORAL
  Filled 2019-10-22 (×2): qty 2

## 2019-10-22 MED ORDER — CEFAZOLIN SODIUM-DEXTROSE 2-4 GM/100ML-% IV SOLN
2.0000 g | INTRAVENOUS | Status: AC
  Administered 2019-10-22: 2 g via INTRAVENOUS
  Filled 2019-10-22: qty 100

## 2019-10-22 MED ORDER — ROCURONIUM BROMIDE 10 MG/ML (PF) SYRINGE
PREFILLED_SYRINGE | INTRAVENOUS | Status: DC | PRN
  Administered 2019-10-22: 20 mg via INTRAVENOUS
  Administered 2019-10-22 (×2): 10 mg via INTRAVENOUS
  Administered 2019-10-22: 50 mg via INTRAVENOUS

## 2019-10-22 MED ORDER — ONDANSETRON HCL 4 MG/2ML IJ SOLN
INTRAMUSCULAR | Status: DC | PRN
  Administered 2019-10-22: 4 mg via INTRAVENOUS

## 2019-10-22 MED ORDER — ACETAMINOPHEN 500 MG PO TABS
1000.0000 mg | ORAL_TABLET | ORAL | Status: AC
  Administered 2019-10-22: 1000 mg via ORAL
  Filled 2019-10-22: qty 2

## 2019-10-22 MED ORDER — DEXAMETHASONE SODIUM PHOSPHATE 4 MG/ML IJ SOLN: 4.0000 mg | INTRAMUSCULAR | Status: DC

## 2019-10-22 MED ORDER — SENNOSIDES-DOCUSATE SODIUM 8.6-50 MG PO TABS
2.0000 | ORAL_TABLET | Freq: Every day | ORAL | Status: DC
  Administered 2019-10-22: 2 via ORAL
  Filled 2019-10-22: qty 2

## 2019-10-22 MED ORDER — ONDANSETRON HCL 4 MG/2ML IJ SOLN: 4.0000 mg | Freq: Four times a day (QID) | INTRAMUSCULAR | Status: DC | PRN

## 2019-10-22 MED ORDER — ROCURONIUM BROMIDE 10 MG/ML (PF) SYRINGE
PREFILLED_SYRINGE | INTRAVENOUS | Status: AC
  Filled 2019-10-22: qty 10

## 2019-10-22 MED ORDER — PROMETHAZINE HCL 25 MG/ML IJ SOLN
6.2500 mg | Freq: Once | INTRAMUSCULAR | Status: AC
  Administered 2019-10-22: 6.25 mg via INTRAVENOUS

## 2019-10-22 MED ORDER — BUPIVACAINE HCL (PF) 0.25 % IJ SOLN
INTRAMUSCULAR | Status: AC
  Filled 2019-10-22: qty 30

## 2019-10-22 MED ORDER — PROPOFOL 10 MG/ML IV BOLUS
INTRAVENOUS | Status: DC | PRN
  Administered 2019-10-22: 150 mg via INTRAVENOUS

## 2019-10-22 MED ORDER — TRAMADOL HCL 50 MG PO TABS: 100.0000 mg | ORAL_TABLET | Freq: Two times a day (BID) | ORAL | Status: DC | PRN

## 2019-10-22 MED ORDER — FENTANYL CITRATE (PF) 250 MCG/5ML IJ SOLN
INTRAMUSCULAR | Status: AC
  Filled 2019-10-22: qty 5

## 2019-10-22 MED ORDER — FENTANYL CITRATE (PF) 100 MCG/2ML IJ SOLN
INTRAMUSCULAR | Status: DC | PRN
  Administered 2019-10-22 (×5): 50 ug via INTRAVENOUS

## 2019-10-22 MED ORDER — LIDOCAINE HCL 2 % IJ SOLN
INTRAMUSCULAR | Status: AC
  Filled 2019-10-22: qty 20

## 2019-10-22 MED ORDER — HYDROMORPHONE HCL 1 MG/ML IJ SOLN: 0.5000 mg | INTRAMUSCULAR | Status: DC | PRN

## 2019-10-22 MED ORDER — IBUPROFEN 800 MG PO TABS
800.0000 mg | ORAL_TABLET | Freq: Four times a day (QID) | ORAL | Status: DC
  Administered 2019-10-22 – 2019-10-23 (×3): 800 mg via ORAL
  Filled 2019-10-22 (×3): qty 1

## 2019-10-22 MED ORDER — EPHEDRINE SULFATE-NACL 50-0.9 MG/10ML-% IV SOSY
PREFILLED_SYRINGE | INTRAVENOUS | Status: DC | PRN
  Administered 2019-10-22: 10 mg via INTRAVENOUS
  Administered 2019-10-22 (×2): 5 mg via INTRAVENOUS

## 2019-10-22 MED ORDER — LIDOCAINE 2% (20 MG/ML) 5 ML SYRINGE
INTRAMUSCULAR | Status: DC | PRN
  Administered 2019-10-22: 80 mg via INTRAVENOUS

## 2019-10-22 MED ORDER — DEXAMETHASONE SODIUM PHOSPHATE 10 MG/ML IJ SOLN
INTRAMUSCULAR | Status: DC | PRN
  Administered 2019-10-22: 8 mg via INTRAVENOUS

## 2019-10-22 MED ORDER — KCL IN DEXTROSE-NACL 20-5-0.45 MEQ/L-%-% IV SOLN
INTRAVENOUS | Status: DC
  Administered 2019-10-22: 20:00:00 via INTRAVENOUS
  Filled 2019-10-22 (×2): qty 1000

## 2019-10-22 MED ORDER — BUPIVACAINE LIPOSOME 1.3 % IJ SUSP
20.0000 mL | Freq: Once | INTRAMUSCULAR | Status: AC
  Administered 2019-10-22: 20 mL
  Filled 2019-10-22: qty 20

## 2019-10-22 MED ORDER — EPHEDRINE 5 MG/ML INJ
INTRAVENOUS | Status: AC
  Filled 2019-10-22: qty 10

## 2019-10-22 MED ORDER — ONDANSETRON HCL 4 MG PO TABS: 4.0000 mg | ORAL_TABLET | Freq: Four times a day (QID) | ORAL | Status: DC | PRN

## 2019-10-22 MED ORDER — PHENYLEPHRINE 40 MCG/ML (10ML) SYRINGE FOR IV PUSH (FOR BLOOD PRESSURE SUPPORT)
PREFILLED_SYRINGE | INTRAVENOUS | Status: DC | PRN
  Administered 2019-10-22 (×2): 80 ug via INTRAVENOUS

## 2019-10-22 MED ORDER — SUCCINYLCHOLINE CHLORIDE 200 MG/10ML IV SOSY
PREFILLED_SYRINGE | INTRAVENOUS | Status: DC | PRN
  Administered 2019-10-22: 100 mg via INTRAVENOUS

## 2019-10-22 MED ORDER — MIDAZOLAM HCL 5 MG/5ML IJ SOLN
INTRAMUSCULAR | Status: DC | PRN
  Administered 2019-10-22: 2 mg via INTRAVENOUS

## 2019-10-22 MED ORDER — SODIUM CHLORIDE (PF) 0.9 % IJ SOLN
INTRAMUSCULAR | Status: AC
  Filled 2019-10-22: qty 10

## 2019-10-22 MED ORDER — ENOXAPARIN SODIUM 40 MG/0.4ML ~~LOC~~ SOLN
40.0000 mg | SUBCUTANEOUS | Status: AC
  Administered 2019-10-22: 40 mg via SUBCUTANEOUS
  Filled 2019-10-22: qty 0.4

## 2019-10-22 MED ORDER — PHENYLEPHRINE 40 MCG/ML (10ML) SYRINGE FOR IV PUSH (FOR BLOOD PRESSURE SUPPORT)
PREFILLED_SYRINGE | INTRAVENOUS | Status: AC
  Filled 2019-10-22: qty 10

## 2019-10-22 MED ORDER — GABAPENTIN 300 MG PO CAPS
300.0000 mg | ORAL_CAPSULE | ORAL | Status: AC
  Administered 2019-10-22: 300 mg via ORAL
  Filled 2019-10-22: qty 1

## 2019-10-22 MED ORDER — LIDOCAINE 2% (20 MG/ML) 5 ML SYRINGE
INTRAMUSCULAR | Status: DC | PRN
  Administered 2019-10-22: 1.5 mg/kg/h via INTRAVENOUS

## 2019-10-22 MED ORDER — BUPIVACAINE HCL 0.25 % IJ SOLN
INTRAMUSCULAR | Status: DC | PRN
  Administered 2019-10-22: 30 mL

## 2019-10-22 MED ORDER — SUGAMMADEX SODIUM 200 MG/2ML IV SOLN
INTRAVENOUS | Status: DC | PRN
  Administered 2019-10-22: 200 mg via INTRAVENOUS

## 2019-10-22 MED ORDER — LACTATED RINGERS IV SOLN
INTRAVENOUS | Status: DC
  Administered 2019-10-22: 12:00:00 via INTRAVENOUS

## 2019-10-22 MED ORDER — HYDROMORPHONE HCL 1 MG/ML IJ SOLN
0.2500 mg | INTRAMUSCULAR | Status: DC | PRN
  Administered 2019-10-22: 0.25 mg via INTRAVENOUS
  Administered 2019-10-22 (×2): 0.5 mg via INTRAVENOUS

## 2019-10-22 MED ORDER — ONDANSETRON HCL 4 MG/2ML IJ SOLN
INTRAMUSCULAR | Status: AC
  Filled 2019-10-22: qty 2

## 2019-10-22 MED ORDER — STERILE WATER FOR IRRIGATION IR SOLN
Status: DC | PRN
  Administered 2019-10-22: 1000 mL

## 2019-10-22 MED ORDER — DEXAMETHASONE SODIUM PHOSPHATE 10 MG/ML IJ SOLN
INTRAMUSCULAR | Status: AC
  Filled 2019-10-22: qty 1

## 2019-10-22 MED ORDER — ENOXAPARIN SODIUM 40 MG/0.4ML ~~LOC~~ SOLN
40.0000 mg | SUBCUTANEOUS | Status: DC
  Administered 2019-10-23: 40 mg via SUBCUTANEOUS
  Filled 2019-10-22: qty 0.4

## 2019-10-22 MED ORDER — MIDAZOLAM HCL 2 MG/2ML IJ SOLN
INTRAMUSCULAR | Status: AC
  Filled 2019-10-22: qty 2

## 2019-10-22 SURGICAL SUPPLY — 68 items
ADH SKN CLS APL DERMABOND .7 (GAUZE/BANDAGES/DRESSINGS) ×1
AGENT HMST KT MTR STRL THRMB (HEMOSTASIS) ×2
APL ESCP 34 STRL LF DISP (HEMOSTASIS) ×1
APPLICATOR SURGIFLO ENDO (HEMOSTASIS) ×1 IMPLANT
BAG LAPAROSCOPIC 12 15 PORT 16 (BASKET) IMPLANT
BAG RETRIEVAL 12/15 (BASKET)
BAG SPEC RTRVL LRG 6X4 10 (ENDOMECHANICALS) ×4
BLADE SURG SZ10 CARB STEEL (BLADE) IMPLANT
COVER BACK TABLE 60X90IN (DRAPES) ×2 IMPLANT
COVER TIP SHEARS 8 DVNC (MISCELLANEOUS) ×1 IMPLANT
COVER TIP SHEARS 8MM DA VINCI (MISCELLANEOUS) ×1
COVER WAND RF STERILE (DRAPES) IMPLANT
DECANTER SPIKE VIAL GLASS SM (MISCELLANEOUS) ×1 IMPLANT
DERMABOND ADVANCED (GAUZE/BANDAGES/DRESSINGS) ×1
DERMABOND ADVANCED .7 DNX12 (GAUZE/BANDAGES/DRESSINGS) ×1 IMPLANT
DRAPE ARM DVNC X/XI (DISPOSABLE) ×4 IMPLANT
DRAPE COLUMN DVNC XI (DISPOSABLE) ×1 IMPLANT
DRAPE DA VINCI XI ARM (DISPOSABLE) ×4
DRAPE DA VINCI XI COLUMN (DISPOSABLE) ×1
DRAPE SHEET LG 3/4 BI-LAMINATE (DRAPES) ×3 IMPLANT
DRAPE SURG IRRIG POUCH 19X23 (DRAPES) ×2 IMPLANT
DRSG OPSITE POSTOP 4X6 (GAUZE/BANDAGES/DRESSINGS) ×1 IMPLANT
DRSG OPSITE POSTOP 4X8 (GAUZE/BANDAGES/DRESSINGS) IMPLANT
ELECT REM PT RETURN 15FT ADLT (MISCELLANEOUS) ×2 IMPLANT
GLOVE BIO SURGEON STRL SZ 6 (GLOVE) ×8 IMPLANT
GLOVE BIO SURGEON STRL SZ 6.5 (GLOVE) ×4 IMPLANT
GOWN STRL REUS W/ TWL LRG LVL3 (GOWN DISPOSABLE) ×4 IMPLANT
GOWN STRL REUS W/TWL LRG LVL3 (GOWN DISPOSABLE) ×8
HOLDER FOLEY CATH W/STRAP (MISCELLANEOUS) ×2 IMPLANT
IRRIG SUCT STRYKERFLOW 2 WTIP (MISCELLANEOUS) ×2
IRRIGATION SUCT STRKRFLW 2 WTP (MISCELLANEOUS) ×1 IMPLANT
KIT PROCEDURE DA VINCI SI (MISCELLANEOUS) ×1
KIT PROCEDURE DVNC SI (MISCELLANEOUS) IMPLANT
KIT TURNOVER KIT A (KITS) ×1 IMPLANT
MANIPULATOR UTERINE 4.5 ZUMI (MISCELLANEOUS) ×2 IMPLANT
NDL SPNL 18GX3.5 QUINCKE PK (NEEDLE) IMPLANT
NEEDLE HYPO 22GX1.5 SAFETY (NEEDLE) ×1 IMPLANT
NEEDLE SPNL 18GX3.5 QUINCKE PK (NEEDLE) ×2 IMPLANT
OBTURATOR OPTICAL STANDARD 8MM (TROCAR) ×1
OBTURATOR OPTICAL STND 8 DVNC (TROCAR) ×1
OBTURATOR OPTICALSTD 8 DVNC (TROCAR) ×1 IMPLANT
PACK ROBOT GYN CUSTOM WL (TRAY / TRAY PROCEDURE) ×2 IMPLANT
PAD POSITIONING PINK XL (MISCELLANEOUS) ×2 IMPLANT
PORT ACCESS TROCAR AIRSEAL 12 (TROCAR) ×1 IMPLANT
PORT ACCESS TROCAR AIRSEAL 5M (TROCAR) ×1
POUCH SPECIMEN RETRIEVAL 10MM (ENDOMECHANICALS) ×4 IMPLANT
SEAL CANN UNIV 5-8 DVNC XI (MISCELLANEOUS) ×3 IMPLANT
SEAL XI 5MM-8MM UNIVERSAL (MISCELLANEOUS) ×3
SET TRI-LUMEN FLTR TB AIRSEAL (TUBING) ×2 IMPLANT
SPONGE LAP 18X18 X RAY DECT (DISPOSABLE) ×1 IMPLANT
SURGIFLO W/THROMBIN 8M KIT (HEMOSTASIS) ×2 IMPLANT
SUT MNCRL AB 4-0 PS2 18 (SUTURE) IMPLANT
SUT PDS AB 1 TP1 96 (SUTURE) IMPLANT
SUT PROLENE 5 0 P 3 (SUTURE) ×1 IMPLANT
SUT VIC AB 0 CT1 27 (SUTURE)
SUT VIC AB 0 CT1 27XBRD ANTBC (SUTURE) IMPLANT
SUT VIC AB 2-0 CT1 27 (SUTURE)
SUT VIC AB 2-0 CT1 TAPERPNT 27 (SUTURE) IMPLANT
SUT VIC AB 4-0 PS2 27 (SUTURE) ×1 IMPLANT
SUT VICRYL 4-0 PS2 18IN ABS (SUTURE) ×4 IMPLANT
SYR 10ML LL (SYRINGE) ×1 IMPLANT
TOWEL OR NON WOVEN STRL DISP B (DISPOSABLE) ×2 IMPLANT
TRAP SPECIMEN MUCOUS 40CC (MISCELLANEOUS) IMPLANT
TRAY FOLEY MTR SLVR 16FR STAT (SET/KITS/TRAYS/PACK) ×2 IMPLANT
TROCAR XCEL NON-BLD 5MMX100MML (ENDOMECHANICALS) IMPLANT
UNDERPAD 30X36 HEAVY ABSORB (UNDERPADS AND DIAPERS) ×2 IMPLANT
WATER STERILE IRR 1000ML POUR (IV SOLUTION) ×2 IMPLANT
YANKAUER SUCT BULB TIP 10FT TU (MISCELLANEOUS) ×1 IMPLANT

## 2019-10-22 NOTE — Anesthesia Procedure Notes (Signed)
Procedure Name: Intubation Date/Time: 10/22/2019 1:08 PM Performed by: Victoriano Lain, CRNA Pre-anesthesia Checklist: Patient identified, Emergency Drugs available, Suction available, Patient being monitored and Timeout performed Patient Re-evaluated:Patient Re-evaluated prior to induction Oxygen Delivery Method: Circle system utilized Preoxygenation: Pre-oxygenation with 100% oxygen Induction Type: IV induction Laryngoscope Size: Mac and 4 Grade View: Grade I Tube type: Oral Tube size: 7.5 mm Number of attempts: 1 Airway Equipment and Method: Stylet Placement Confirmation: ETT inserted through vocal cords under direct vision,  positive ETCO2 and breath sounds checked- equal and bilateral Secured at: 21 cm Tube secured with: Tape Dental Injury: Teeth and Oropharynx as per pre-operative assessment

## 2019-10-22 NOTE — Transfer of Care (Signed)
Immediate Anesthesia Transfer of Care Note  Patient: Tina Stone  Procedure(s) Performed: XI ROBOTIC ASSISTED TOTAL HYSTERECTOMY WITH BILATERAL SALPINGO OOPHORECTOMY GREATER THAN 250 GRAMS, MINI LAPARTOMY FOR SPECIMEN DELIVERY; PELVIC AND PERI-AORTIC LYMPHADENECTOMY (N/A ) SENTINEL NODE BIOPSY (N/A )  Patient Location: PACU  Anesthesia Type:General  Level of Consciousness: awake, alert , oriented and patient cooperative  Airway & Oxygen Therapy: Patient Spontanous Breathing and Patient connected to face mask oxygen  Post-op Assessment: Report given to RN and Post -op Vital signs reviewed and stable  Post vital signs: Reviewed and stable  Last Vitals:  Vitals Value Taken Time  BP 133/90 10/22/19 1703  Temp    Pulse 90 10/22/19 1708  Resp 12 10/22/19 1708  SpO2 100 % 10/22/19 1708  Vitals shown include unvalidated device data.  Last Pain:  Vitals:   10/22/19 1128  TempSrc:   PainSc: 0-No pain         Complications: No apparent anesthesia complications

## 2019-10-22 NOTE — Interval H&P Note (Signed)
History and Physical Interval Note:  10/22/2019 12:39 PM  Tina Stone  has presented today for surgery, with the diagnosis of ENDOMETRIAL CANCER.  The various methods of treatment have been discussed with the patient and family. After consideration of risks, benefits and other options for treatment, the patient has consented to  Procedure(s): XI ROBOTIC ASSISTED TOTAL HYSTERECTOMY WITH BILATERAL SALPINGO OOPHORECTOMY GREATER THAN 250 GRAMS, MIN [place] (N/A) SENTINEL NODE BIOPSY (N/A) as a surgical intervention.  The patient's history has been reviewed, patient examined, no change in status, stable for surgery.  I have reviewed the patient's chart and labs.  Questions were answered to the patient's satisfaction.     Thereasa Solo

## 2019-10-22 NOTE — Anesthesia Postprocedure Evaluation (Signed)
Anesthesia Post Note  Patient: Tina Stone  Procedure(s) Performed: XI ROBOTIC ASSISTED TOTAL HYSTERECTOMY WITH BILATERAL SALPINGO OOPHORECTOMY GREATER THAN 250 GRAMS, MINI LAPARTOMY FOR SPECIMEN DELIVERY; PELVIC AND PERI-AORTIC LYMPHADENECTOMY (N/A ) SENTINEL NODE BIOPSY (N/A )     Patient location during evaluation: PACU Anesthesia Type: General Level of consciousness: awake and alert Pain management: pain level controlled Vital Signs Assessment: post-procedure vital signs reviewed and stable Respiratory status: spontaneous breathing, nonlabored ventilation, respiratory function stable and patient connected to nasal cannula oxygen Cardiovascular status: blood pressure returned to baseline and stable Postop Assessment: no apparent nausea or vomiting Anesthetic complications: no    Last Vitals:  Vitals:   10/22/19 1730 10/22/19 1745  BP: (!) 149/82 (!) 160/81  Pulse: 88 95  Resp: 17 18  Temp: (!) 35.8 C (!) 36.4 C  SpO2: 100% 100%    Last Pain:  Vitals:   10/22/19 1745  TempSrc:   PainSc: 7                  Lamona Eimer,W. EDMOND

## 2019-10-22 NOTE — Op Note (Signed)
OPERATIVE NOTE 10/22/19  Surgeon: Donaciano Eva   Assistants: Dr Lahoma Crocker (an MD assistant was necessary for tissue manipulation, management of robotic instrumentation, retraction and positioning due to the complexity of the case and hospital policies).   Anesthesia: General endotracheal anesthesia  ASA Class: 3   Pre-operative Diagnosis: endometrial cancer grade 3  Post-operative Diagnosis: same,   Operation: Robotic-assisted laparoscopic total hysterectomy for uterus >250gm with bilateral salpingoophorectomy, SLN mapping, bilateral pelvic and para-aortic lymphadenectomy.  Surgeon: Donaciano Eva  Assistant Surgeon: Lahoma Crocker MD  Anesthesia: GET  Urine Output: 200cc  Operative Findings:  : 16cm bulky fibroid uterus, normal ovaries bilaterally, no suspicious lymph nodes.  Estimated Blood Loss:  less than 50 mL      Total IV Fluids: 1,000 ml         Specimens: uterus, cervix, bilateral tubes and ovaries, right external iliac sentinel lymph node, bilateral pelvic bilateral periaortic lymph nodes, minilaparotomy.         Complications:  None; patient tolerated the procedure well.         Disposition: PACU - hemodynamically stable.  Procedure Details  The patient was seen in the Holding Room. The risks, benefits, complications, treatment options, and expected outcomes were discussed with the patient.  The patient concurred with the proposed plan, giving informed consent.  The site of surgery properly noted/marked. The patient was identified as Tina Stone and the procedure verified as a Robotic-assisted hysterectomy with bilateral salpingo oophorectomy with SLN mapping and bilateral pelvic and para-aortic lymphadenectomy. A Time Out was held and the above information confirmed.  After induction of anesthesia, the patient was draped and prepped in the usual sterile manner. Pt was placed in supine position after anesthesia and draped and prepped in  the usual sterile manner. The abdominal drape was placed after the CholoraPrep had been allowed to dry for 3 minutes.  Her arms were tucked to her side with all appropriate precautions.  The shoulders were stabilized with padded shoulder blocks applied to the acromium processes.  The patient was placed in the semi-lithotomy position in [place].  The perineum was prepped with Betadine. The patient was then prepped. Foley catheter was placed.  A sterile speculum was placed in the vagina.  The cervix was grasped with a single-tooth tenaculum. 2mg  total of ICG was injected into the cervical stroma at 2 and 9 o'clock with 1cc injected at a 1cm and 24mm depth (concentration 0.5mg /ml) in all locations. The cervix was dilated with Kennon Rounds dilators.  The ZUMI uterine manipulator with a medium colpotomizer ring was placed without difficulty.  A pneum occluder balloon was placed over the manipulator.  OG tube placement was confirmed and to suction.   Next, a 5 mm skin incision was made 1 cm below the subcostal margin in the midclavicular line.  The 5 mm Optiview port and scope was used for direct entry.  Opening pressure was under 10 mm CO2.  The abdomen was insufflated and the findings were noted as above.   At this point and all points during the procedure, the patient's intra-abdominal pressure did not exceed 15 mmHg. Next, a 10 mm skin incision was made above the umbilicus and a right and left port was placed about 10 cm lateral to the robot port on the right and left side.  A fourth arm was placed in the left lower quadrant 2 cm above and superior and medial to the anterior superior iliac spine.  All ports were placed  under direct visualization.  The patient was placed in steep Trendelenburg.  Bowel was folded away into the upper abdomen.  The robot was docked in the normal manner.  The right and left peritoneum were opened parallel to the IP ligament to open the retroperitoneal spaces bilaterally. The SLN mapping  was performed in bilateral pelvic basins. The para rectal and paravesical spaces were opened up entirely with careful dissection below the level of the ureters bilaterally and to the depth of the uterine artery origin in order to skeletonize the uterine "web" and ensure visualization of all parametrial channels. The para-aortic basins were carefully exposed and evaluated for isolated para-aortic SLN's. No lymphatic channels were identified travelling to sentinel lymph node's.   Meticulous retroperitoneal dissection was performed to skeletonize the uterine arteries at their origin.  They were bipolar sealed.  This occurred after mobilization of the ureters medially.  The hysterectomy was started after the round ligament on the right side was incised and the retroperitoneum was entered and the pararectal space was developed.  The ureter was noted to be on the medial leaf of the broad ligament.  The peritoneum above the ureter was incised and stretched and the infundibulopelvic ligament was skeletonized, cauterized and cut.  The posterior peritoneum was taken down to the level of the KOH ring.  The anterior peritoneum was also taken down.  The bladder flap was created to the level of the KOH ring.  The uterine artery on the right side was skeletonized, cauterized and cut in the normal manner.  A similar procedure was performed on the left.  The colpotomy was made and the uterus, cervix, bilateral ovaries and tubes were separated but could not be delivered through the vagina.  Pedicles were inspected and excellent hemostasis was achieved.    The paravesical space was developed with monopolar and sharp dissection. It was held open with tension on the median umbilical ligament with the forth arm. The paravesical space was opened with blunt and sharp dissection to mobilize the ureter off of the medial surface of the internal iliac artery. The medial leaf of the broad ligament containing the ureter was held medially  (opening the pararectal space) by the assistant's grasper. The left pelvic lymphadenectomy was performed by skeletonizing the internal iliac artery at the bifurcation with the external iliac artery. The obturator nerve was identified in the base of lateral paravesical space. The ureter was mobilized medially off of the dissection by developing the pararectal space. The genitofemoral nerve was identified, skeletonized and mobilized laterally off of the external iliac artery. During the process of this there was entry into the external iliac vein (95mm) which was closed with a 5-0 Prolene in an interrupted fashion without narrowing the lumen of the external iliac vein.  It was hemostatic which was reinforced with Surgi-Flo.  An enbloc resection of lymph nodes was performed within the following boundaries: the mid portion of the common iliac proximally, the circumflex iliac vein distally, the obturator nerve posteriorally, the genitofemoral nerve laterally. The nodal basin (including obturator space) were confirmed to be empty of nodes and hemostatic. The nodes were placed in an endocatch bag and retrieved vaginally.  The para-aortic lymph node dissection was performed first on the right. The peritoneum overlying the lateral surface of the common iliac and vena cava was opened vertically towards the duodenum. It was elevated as a shelf and the ureter was identified in this retroperitoneum. It was mobilized and retracted laterally with the 4th arm. The right  para-aortic dissection took place by separating an enbloc segment of node containing fat from the mid portion of the common iliac distally, the retroperitoneal duodenum superiorally, the genitofemoral nerve laterally and the aorta medially.  Hemostasis was confirmed and was reinforced with Floseal.  The left para-aortic lymphadenectomy was performed by elevating the peritoneal edge and mobilizing the sigmoid mesentery from the underlying aorta. The left  para-aortic retroperitoneal space (between the sigmoid mesentery and the lymph node bundle) was developed and the left ureter was identified and retracted with the forth arm. The left para-aortic nodes were removed en bloc by using monopolar and sharp dissection to separate all nodal fatty tissue between the mid portion of the left common iliac artery inferiorally and the IMA superiorally, the aorta medially, the vertebral bodies posteriorally and the tendon of the psoas muscle laterally.  The paravesical space was developed with monopolar and sharp dissection. It was held open with tension on the median umbilical ligament with the forth arm. The paravesical space was opened with blunt and sharp dissection to mobilize the ureter off of the medial surface of the internal iliac artery. The medial leaf of the broad ligament containing the ureter was held medially (opening the pararectal space) by the assistant's grasper. The right pelvic lymphadenectomy was performed by skeletonizing the internal iliac artery at the bifurcation with the external iliac artery. The obturator nerve was identified in the base of lateral paravesical space. The ureter was mobilized medially off of the dissection by developing the pararectal space. The genitofemoral nerve was identified, skeletonized and mobilized laterally off of the external iliac artery. An enbloc resection of lymph nodes was performed within the following boundaries: the mid portion of the common iliac proximally, the circumflex iliac vein distally, the obturator nerve posteriorally, the genitofemoral nerve laterally. The nodal basin (including obturator space) were confirmed to be empty of nodes and hemostatic. The nodes were placed in an endocatch bag and retrieved vaginally.  The colpotomy at the vaginal cuff was closed with Vicryl on a CT1 needle in a running manner.  Irrigation was used and excellent hemostasis was achieved.  At this point in the procedure was  completed.  Robotic instruments were removed under direct visulaization.  The robot was undocked.   A 6cm suprapubic low transverse incision was made with the scalpel. The subcutaneous skin was opened with the bovie. The fascia was opened with the bovie transversely and the rectus muscles were dissected off of the fascia inferiorally and superiorally. The peritoneum was opened sharply in the midline. The peritoneal incision was extended. The uterine specimen in the endocatch bag was retrieved through the incision. The fascia was closed with running 0-vicryl. The subcutaneous fat was closed with 2-0 vicryl. 20cc of exparel mixed with 20cc of marcaine was infiltrated into the incision. The incision was closed at the skin with monocryl and dermabond.   The 10 mm ports were closed with Vicryl on a UR-5 needle and the fascia was closed with 0 Vicryl on a UR-5 needle.  The skin was closed with 4-0 Vicryl in a subcuticular manner.  Dermabond was applied.  Sponge, lap and needle counts correct x 2.  The patient was taken to the recovery room in stable condition.  The vagina was swabbed with  minimal bleeding noted.   All instrument and needle counts were correct x  3.   The patient was transferred to the recovery room in a stable condition.  Donaciano Eva, MD

## 2019-10-22 NOTE — Anesthesia Preprocedure Evaluation (Addendum)
Anesthesia Evaluation  Patient identified by MRN, date of birth, ID band Patient awake    Reviewed: Allergy & Precautions, H&P , NPO status , Patient's Chart, lab work & pertinent test results  History of Anesthesia Complications (+) PONV  Airway Mallampati: II  TM Distance: >3 FB Neck ROM: Full    Dental no notable dental hx. (+) Partial Upper, Partial Lower, Dental Advisory Given   Pulmonary neg pulmonary ROS, former smoker,    Pulmonary exam normal breath sounds clear to auscultation       Cardiovascular hypertension,  Rhythm:Regular Rate:Normal     Neuro/Psych negative neurological ROS  negative psych ROS   GI/Hepatic Neg liver ROS, GERD  Controlled,  Endo/Other  Morbid obesity  Renal/GU negative Renal ROS  negative genitourinary   Musculoskeletal   Abdominal   Peds  Hematology  (+) Blood dyscrasia, anemia ,   Anesthesia Other Findings   Reproductive/Obstetrics negative OB ROS                            Anesthesia Physical Anesthesia Plan  ASA: III  Anesthesia Plan: General   Post-op Pain Management:    Induction: Intravenous  PONV Risk Score and Plan: 4 or greater and Ondansetron, Dexamethasone, Midazolam and Scopolamine patch - Pre-op  Airway Management Planned: Oral ETT  Additional Equipment:   Intra-op Plan:   Post-operative Plan: Extubation in OR  Informed Consent: I have reviewed the patients History and Physical, chart, labs and discussed the procedure including the risks, benefits and alternatives for the proposed anesthesia with the patient or authorized representative who has indicated his/her understanding and acceptance.     Dental advisory given  Plan Discussed with: CRNA  Anesthesia Plan Comments:         Anesthesia Quick Evaluation

## 2019-10-23 ENCOUNTER — Encounter (HOSPITAL_COMMUNITY): Payer: Self-pay | Admitting: Gynecologic Oncology

## 2019-10-23 DIAGNOSIS — C541 Malignant neoplasm of endometrium: Secondary | ICD-10-CM | POA: Diagnosis not present

## 2019-10-23 DIAGNOSIS — Z881 Allergy status to other antibiotic agents status: Secondary | ICD-10-CM | POA: Diagnosis not present

## 2019-10-23 DIAGNOSIS — I10 Essential (primary) hypertension: Secondary | ICD-10-CM | POA: Diagnosis not present

## 2019-10-23 DIAGNOSIS — Z888 Allergy status to other drugs, medicaments and biological substances status: Secondary | ICD-10-CM | POA: Diagnosis not present

## 2019-10-23 DIAGNOSIS — Z79899 Other long term (current) drug therapy: Secondary | ICD-10-CM | POA: Diagnosis not present

## 2019-10-23 DIAGNOSIS — Z8249 Family history of ischemic heart disease and other diseases of the circulatory system: Secondary | ICD-10-CM | POA: Diagnosis not present

## 2019-10-23 DIAGNOSIS — E7800 Pure hypercholesterolemia, unspecified: Secondary | ICD-10-CM | POA: Diagnosis not present

## 2019-10-23 DIAGNOSIS — Z87891 Personal history of nicotine dependence: Secondary | ICD-10-CM | POA: Diagnosis not present

## 2019-10-23 DIAGNOSIS — K219 Gastro-esophageal reflux disease without esophagitis: Secondary | ICD-10-CM | POA: Diagnosis not present

## 2019-10-23 LAB — CBC
HCT: 35.5 % — ABNORMAL LOW (ref 36.0–46.0)
Hemoglobin: 11 g/dL — ABNORMAL LOW (ref 12.0–15.0)
MCH: 24.6 pg — ABNORMAL LOW (ref 26.0–34.0)
MCHC: 31 g/dL (ref 30.0–36.0)
MCV: 79.4 fL — ABNORMAL LOW (ref 80.0–100.0)
Platelets: 209 10*3/uL (ref 150–400)
RBC: 4.47 MIL/uL (ref 3.87–5.11)
RDW: 15.9 % — ABNORMAL HIGH (ref 11.5–15.5)
WBC: 9.1 10*3/uL (ref 4.0–10.5)
nRBC: 0 % (ref 0.0–0.2)

## 2019-10-23 LAB — BASIC METABOLIC PANEL
Anion gap: 10 (ref 5–15)
BUN: 12 mg/dL (ref 8–23)
CO2: 26 mmol/L (ref 22–32)
Calcium: 8.4 mg/dL — ABNORMAL LOW (ref 8.9–10.3)
Chloride: 100 mmol/L (ref 98–111)
Creatinine, Ser: 0.89 mg/dL (ref 0.44–1.00)
GFR calc Af Amer: >60 mL/min (ref >60)
GFR calc non Af Amer: >60 mL/min (ref >60)
Glucose, Bld: 164 mg/dL — ABNORMAL HIGH (ref 70–99)
Potassium: 3.5 mmol/L (ref 3.5–5.1)
Sodium: 136 mmol/L (ref 135–145)

## 2019-10-23 MED ORDER — EPHEDRINE 5 MG/ML INJ
INTRAVENOUS | Status: AC
  Filled 2019-10-23: qty 10

## 2019-10-23 NOTE — Progress Notes (Signed)
Pt alert and oriented, tolerating diet. D/C orders given, pt d/cd home.

## 2019-10-23 NOTE — Plan of Care (Signed)
All goals met for d/c.

## 2019-10-23 NOTE — Discharge Instructions (Signed)
10/22/2019  Return to work: 4 weeks  Activity: 1. Be up and out of the bed during the day.  Take a nap if needed.  You may walk up steps but be careful and use the hand rail.  Stair climbing will tire you more than you think, you may need to stop part way and rest.   2. No lifting or straining for 6 weeks.  3. No driving for 2 weeks.  Do Not drive if you are taking narcotic pain medicine.  4. Shower daily.  Use soap and water on your incision and pat dry; don't rub. No tub baths or submerging your body in water for 4 weeks or until seen in the office.  5. No sexual activity and nothing in the vagina for 8 weeks.  Medications:  - Take ibuprofen and tylenol first line for pain control. Take these regularly (every 6 hours) to decrease the build up of pain.  - If necessary, for severe pain not relieved by ibuprofen, take hydrocodone/APAP.  - While taking hydrocodone/APAP you should take sennakot every night to reduce the likelihood of constipation. If this causes diarrhea, stop its use.  Diet: 1. Low sodium Heart Healthy Diet is recommended.  2. It is safe to use a laxative if you have difficulty moving your bowels.   Wound Care: 1. Keep clean and dry.  Shower daily. 2. You can get the dressing wet in the shower, but avoid tub baths. 3. Remove the dressing between 5 and 7 days after your surgery (1 week after your operation). 4. After the dressing is removed you can get the incision wet in the shower, however continue to avoid tub baths until advised otherwise by your surgeon at follow-up.    Reasons to call the Doctor:   Fever - Oral temperature greater than 100.4 degrees Fahrenheit  Foul-smelling vaginal discharge  Difficulty urinating  Nausea and vomiting  Increased pain at the site of the incision that is unrelieved with pain medicine.  Difficulty breathing with or without chest pain  New calf pain especially if only on one side  Sudden, continuing increased vaginal  bleeding with or without clots.   Follow-up:  1. First visit on 10/28/2019 will be a phone visit. See Everitt Amber in 3 weeks.  Contacts: For questions or concerns you should contact:  Dr. Everitt Amber at [phone]  After hours and on week-ends call [phone] and ask to speak to the physician on call for Gynecologic Oncology

## 2019-10-23 NOTE — Discharge Summary (Signed)
Physician Discharge Summary  Patient ID: Tina Stone MRN: [id number] DOB/AGE: 1950-02-02 69 y.o.  Admit date: 10/22/2019 Discharge date: 10/23/2019  Admission Diagnoses: Endometrial cancer [facility])  Discharge Diagnoses:  Principal Problem:   Endometrial cancer [facility]) Active Problems:   Obesity   Discharged Condition:  The patient is in good condition and stable for discharge.    Hospital Course: On 10/22/2019, the patient underwent the following: Procedure(s): XI ROBOTIC ASSISTED TOTAL HYSTERECTOMY WITH BILATERAL SALPINGO OOPHORECTOMY GREATER THAN 250 GRAMS, MINI LAPARTOMY FOR SPECIMEN DELIVERY; PELVIC AND PERI-AORTIC LYMPHADENECTOMY SENTINEL NODE BIOPSY.  The postoperative course was uneventful.  She was discharged to home on postoperative day 1 tolerating a regular diet, voiding, pain controlled, ambulating without difficulty, passing flatus.  Consults: None  Significant Diagnostic Studies: None  Treatments: surgery: see above  Discharge Exam: Blood pressure 133/81, pulse 72, temperature 98.6 F (37 C), temperature source Oral, resp. rate 18, height 5\' 4"  (1.626 m), weight 215 lb 6.4 oz (97.7 kg), SpO2 100 %. General appearance: alert, cooperative and no distress Resp: clear to auscultation bilaterally Cardio: regular rate and rhythm, S1, S2 normal, no murmur, click, rub or gallop GI: soft, non-tender; bowel sounds normal; no masses,  no organomegaly Extremities: extremities normal, atraumatic, no cyanosis or edema Incision/Wound: Lap sites to the abdomen with dermabond without active drainage. Pfannenstiel incision to the lower abdomen with op site dressing in place with no active drainage noted underneath.  Disposition: Discharge disposition: 01-Home or Self Care       Discharge Instructions    Call MD for:  difficulty breathing, headache or visual disturbances   Complete by: As directed    Call MD for:  extreme fatigue   Complete by: As directed    Call MD  for:  hives   Complete by: As directed    Call MD for:  persistant dizziness or light-headedness   Complete by: As directed    Call MD for:  persistant nausea and vomiting   Complete by: As directed    Call MD for:  redness, tenderness, or signs of infection (pain, swelling, redness, odor or green/yellow discharge around incision site)   Complete by: As directed    Call MD for:  severe uncontrolled pain   Complete by: As directed    Call MD for:  temperature >100.4   Complete by: As directed    Diet - low sodium heart healthy   Complete by: As directed    Driving Restrictions   Complete by: As directed    No driving for 2 weeks.  Do not take narcotics and drive.   Increase activity slowly   Complete by: As directed    Lifting restrictions   Complete by: As directed    No lifting greater than 10 lbs.   Sexual Activity Restrictions   Complete by: As directed    No sexual activity, nothing in the vagina, for 8 weeks.     Allergies as of 10/23/2019      Reactions   Benicar Hct [olmesartan Medoxomil-hctz] Shortness Of Breath, Palpitations   Bactrim [sulfamethoxazole-trimethoprim] Other (See Comments)   Abdominal pain, dizziness   Pravastatin Other (See Comments)   Lower abdominal pain   Shellfish Allergy Nausea And Vomiting   Amlodipine Palpitations      Medication List    TAKE these medications   APPLE CIDER VINEGAR PO Take 5 mLs by mouth daily.   chlorthalidone 25 MG tablet Commonly known as: HYGROTON Take 0.5 tablets (12.5 mg total)  by mouth daily.   HYDROcodone-acetaminophen 5-325 MG tablet Commonly known as: NORCO/VICODIN Take 1 tablet by mouth every 4 (four) hours as needed for severe pain. For AFTER surgery only, do not take and drive   ibuprofen [id number] MG tablet Commonly known as: ADVIL Take 1 tablet (600 mg total) by mouth every 6 (six) hours as needed for moderate pain. For AFTER surgery   senna-docusate 8.6-50 MG tablet Commonly known as: Senokot-S Take 2  tablets by mouth at bedtime. For AFTER surgery, do not take if having diarrhea      Follow-up Information    Everitt Amber, MD Follow up on 10/28/2019.   Specialty: Gynecologic Oncology Why: on the phone around 3:15pm. Dr. Denman George will call you to discuss final pathology and see how you are doing post-op.  On 11/17/2019 at 3:45pm, this appt will be in person at the [facility] information: [place] [id number] [phone]           Greater than thirty minutes were spend for face to face discharge instructions and discharge orders/summary in EPIC.   Signed: Dorothyann Gibbs 10/23/2019, 11:15 AM

## 2019-10-28 ENCOUNTER — Inpatient Hospital Stay (HOSPITAL_BASED_OUTPATIENT_CLINIC_OR_DEPARTMENT_OTHER): Payer: Medicare Other | Admitting: Gynecologic Oncology

## 2019-10-28 ENCOUNTER — Encounter: Payer: Self-pay | Admitting: Gynecologic Oncology

## 2019-10-28 DIAGNOSIS — Z7189 Other specified counseling: Secondary | ICD-10-CM

## 2019-10-28 DIAGNOSIS — C541 Malignant neoplasm of endometrium: Secondary | ICD-10-CM

## 2019-10-28 NOTE — Progress Notes (Signed)
Gynecologic Oncology Telehealth Consult Note: Gyn-Onc  I connected with [name] on 10/28/19 at  3:15 PM EDT by telephone and verified that I am speaking with the correct person using two identifiers.  I discussed the limitations, risks, security and privacy concerns of performing an evaluation and management service by telemedicine and the availability of in-person appointments. I also discussed with the patient that there may be a patient responsible charge related to this service. The patient expressed understanding and agreed to proceed.  Other persons participating in the visit and their role in the encounter: none.  Patient's location: home Provider's location: [place]  Chief Complaint:  Chief Complaint  Patient presents with  . endometrial cancer    counseling and coordination   Assessment/Plan:  Tina. Jalila Stone  is a 69 y.o.  year old with stage IA grade 3 (serous/clear cell) endometrial cancer.  High/intermediate risk factors for recurrence. Recommendation is for vaginal brachytherapy and possibly chemotherapy to reduce risk for local recurrence in accordance with NCCN guidelines.  I discussed this with the patient. I discussed the role of adjuvant therapy. I discussed prognosis and risk for recurrence. We reviewed symptoms concerning for recurrence and she will see me if these develop prior to her scheduled appointment.  After completing adjuvant therapy I recommend she follow-up at 3 monthly intervals for symptom review, physical examination and pelvic examination. Pap smear is not recommended in routine endometrial cancer surveillance. After 2 years we will space these visits to every 6 months, and then annually if recurrence has not developed within 5 years. All questions were answered.  HPI: Tina Tina Stone is a 69 year old P0 who is seen in consultation at the request of Dr Ihor Dow for grade 3 poorly differentiated endometrial  cancer.  The patient reported a history of postmenopausal bleeding that began 1 to 2 months before diagnosis.  She presented for a transvaginal ultrasound scan on September 23, 2019 which revealed a fibroid uterus measuring 6.8 x 7.3 x 14.5 cm.  The endometrium was thickened at 6.2 mm.  The left and ovary and right ovaries were not seen.  This prompted a endometrial biopsy to be performed on the same day, 09/23/2019, which revealed high-grade poorly differentiated carcinoma consistent with FIGO grade 3.  The patient has had a recent history of a robotic cholecystectomy in September 2020 with Dr. Josie Saunders.  This was an uncomplicated procedure.  She otherwise has not had any abdominal surgery.  Her gynecologic history is significant for her being nulliparous, she has never had surgical procedures for her fibroid uterus.  She transition through menopause in her 22s.  She continues to have intermittent menopausal symptoms.  Her medical history is otherwise remarkable for hypertension and gastroesophageal reflux disease.  Her primary care physician is Dr. Luetta Nutting.  Her family history is unremarkable for GYN or colonic malignancies.  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  Interval Hx:  On October 22, 2019 she underwent a robotic assisted total laparoscopic hysterectomy for uterus greater than 250 g, bilateral salpingo-oophorectomy, sentinel node mapping, bilateral pelvic and periotic lymphadenectomy.  Intraoperative findings were significant for 16 cm bulky fibroid uterus with normal ovaries bilaterally no suspicious lymph nodes.  Mini laparotomy was required for specimen delivery.  Surgery was uncomplicated and she was discharged on postoperative day 1.  Final pathology revealed a poorly differentiated carcinoma, FIGO grade 3.  Morphologically it is predominantly serous features with  a few foci of squamous differentiation and rare foci of clear cell  features.  Pelvic and periaortic lymph nodes were negative for metastases as were the adnexa and cervix.  The tumor exhibited 13 of 40 mm (inner half) myometrial invasion.  Lymphovascular space invasion was present.  MSI testing is pending.  She was determined to have stage Ia grade 3 serous carcinoma with possible clear cell features of the endometrium.  Given the high risk features adjuvant therapy was recommended in accordance with NCCN guidelines.  Current Meds:  Outpatient Encounter Medications as of 10/28/2019  Medication Sig  . APPLE CIDER VINEGAR PO Take 5 mLs by mouth daily.   . chlorthalidone (HYGROTON) 25 MG tablet Take 0.5 tablets (12.5 mg total) by mouth daily.  Marland Kitchen HYDROcodone-acetaminophen (NORCO/VICODIN) 5-325 MG tablet Take 1 tablet by mouth every 4 (four) hours as needed for severe pain. For AFTER surgery only, do not take and drive  . ibuprofen (ADVIL) 600 MG tablet Take 1 tablet (600 mg total) by mouth every 6 (six) hours as needed for moderate pain. For AFTER surgery  . senna-docusate (SENOKOT-S) 8.6-50 MG tablet Take 2 tablets by mouth at bedtime. For AFTER surgery, do not take if having diarrhea   No facility-administered encounter medications on file as of 10/28/2019.     Allergy:  Allergies  Allergen Reactions  . Benicar Hct [Olmesartan Medoxomil-Hctz] Shortness Of Breath and Palpitations  . Bactrim [Sulfamethoxazole-Trimethoprim] Other (See Comments)    Abdominal pain, dizziness  . Pravastatin Other (See Comments)    Lower abdominal pain  . Shellfish Allergy Nausea And Vomiting  . Amlodipine Palpitations    Social Hx:   Social History   Socioeconomic History  . Marital status: Widowed    Spouse name: Not on file  . Number of children: Not on file  . Years of education: Not on file  . Highest education level: Not on file  Occupational History  . Not on file  Social Needs  . Financial resource strain: Not on file  . Food insecurity    Worry: Not on file     Inability: Not on file  . Transportation needs    Medical: Not on file    Non-medical: Not on file  Tobacco Use  . Smoking status: Former Research scientist (life sciences)  . Smokeless tobacco: Never Used  . Tobacco comment: few puffs but not a true smoker quit many yrs ago  Substance and Sexual Activity  . Alcohol use: Never    Frequency: Never  . Drug use: Never  . Sexual activity: Not Currently  Lifestyle  . Physical activity    Days per week: Not on file    Minutes per session: Not on file  . Stress: Not on file  Relationships  . Social Herbalist on phone: Not on file    Gets together: Not on file    Attends religious service: Not on file    Active member of club or organization: Not on file    Attends meetings of clubs or organizations: Not on file    Relationship status: Not on file  . Intimate partner violence    Fear of current or ex partner: Not on file    Emotionally abused: Not on file    Physically abused: Not on file    Forced sexual activity: Not on file  Other Topics Concern  . 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 [place]   right  . BREAST EXCISIONAL BIOPSY Left 2014   benign  . BREAST LUMPECTOMY WITH NEEDLE LOCALIZATION Left 09/09/2013   Procedure: BREAST LUMPECTOMY WITH NEEDLE LOCALIZATION;  Surgeon: Imogene Burn. Georgette Dover, MD;  Location: [facility];  Service: General;  Laterality: Left;  . CHOLECYSTECTOMY    . COLONOSCOPY    . POLYPECTOMY    . ROBOTIC ASSISTED LAPAROSCOPIC CHOLECYSTECTOMY  09/09/2019  . ROBOTIC ASSISTED TOTAL HYSTERECTOMY WITH BILATERAL SALPINGO OOPHERECTOMY N/A 10/22/2019   Procedure: XI ROBOTIC ASSISTED TOTAL HYSTERECTOMY WITH BILATERAL SALPINGO OOPHORECTOMY GREATER THAN 250 GRAMS, MINI LAPARTOMY FOR SPECIMEN DELIVERY; PELVIC AND  PERI-AORTIC LYMPHADENECTOMY;  Surgeon: Everitt Amber, MD;  Location: [facility];  Service: Gynecology;  Laterality: N/A;  . SENTINEL NODE BIOPSY N/A 10/22/2019   Procedure: SENTINEL NODE BIOPSY;  Surgeon: Everitt Amber, MD;  Location: [facility];  Service: Gynecology;  Laterality: N/A;    Past Medical Hx:  Past Medical History:  Diagnosis Date  . Adenomatous colon polyp   . Allergic rhinitis, seasonal   . Allergy   . Beta thalassemia trait 11/25/2013  . Cancer ([place])    endometrial   . Cholelithiasis   . Class 3 obesity without serious comorbidity with body mass index (BMI) of 40.0 to 44.9 in adult 11/19/2012  . GERD (gastroesophageal reflux disease)   . HLD (hyperlipidemia)   . Hypercholesterolemia   . Hypertension 03/18/2017   no meds   . Intraductal papilloma of left breast    Patient underwent left needle-localized lumpectomy by Dr. Imogene Burn. [place] on 09/09/2013; pathology showed intraductal papilloma with no atypia or malignancy identified.  Marland Kitchen PONV (postoperative nausea and vomiting)   . Pre-diabetes    pt denies  . Uterine fibroid     Past Gynecological History:  See HPI No LMP recorded. Patient is postmenopausal.  Family Hx:  Family History  Problem Relation Age of Onset  . Diabetes Mother   . Hypertension Mother   . Colon polyps Mother 98  . Dementia Mother 28  . Diabetes Father   . Congestive Heart Failure Father   . Pancreatic cancer Paternal Aunt   . Colon cancer Neg Hx   . Breast cancer Neg Hx   . Lung cancer Neg Hx   . Esophageal cancer Neg Hx   . Rectal cancer Neg Hx   . Stomach cancer Neg Hx     Review of Systems:  Constitutional  Feels well,    ENT Normal appearing ears and nares bilaterally Skin/Breast  No rash, sores, jaundice, itching, dryness Cardiovascular  No chest pain, shortness of breath, or edema  Pulmonary  No cough or wheeze.  Gastro Intestinal  No nausea, vomitting, or diarrhoea. No bright red blood per rectum, no abdominal pain, change in  bowel movement, or constipation.  Genito Urinary  No frequency, urgency, dysuria, no bleeding Musculo Skeletal  No myalgia, arthralgia, joint swelling or pain  Neurologic  No weakness, numbness, change in gait,  Psychology  No depression, anxiety, insomnia.   Vitals:  There were no vitals taken for this visit.  Physical Exam: Deferred  I discussed the assessment and treatment plan with the patient. The patient was provided with an opportunity to ask questions and all were answered. The patient agreed with the  plan and demonstrated an understanding of the instructions.   The patient was advised to call back or see an in-person evaluation if the symptoms worsen or if the condition fails to improve as anticipated.   I provided 15 minutes of non face-to-face telephone visit time during this encounter, and > 50% was spent counseling as documented under my assessment & plan.  Thereasa Solo, MD  10/28/2019, 3:48 PM

## 2019-11-02 MED FILL — CHLORTHALIDONE 25 MG TABS: 25 | 60 days supply | Qty: 30 | Fill #1

## 2019-11-09 ENCOUNTER — Other Ambulatory Visit: Payer: Self-pay | Admitting: Oncology

## 2019-11-09 ENCOUNTER — Telehealth: Payer: Self-pay | Admitting: Oncology

## 2019-11-09 DIAGNOSIS — C541 Malignant neoplasm of endometrium: Secondary | ICD-10-CM

## 2019-11-09 NOTE — Progress Notes (Signed)
Gynecologic Oncology Multi-Disciplinary Disposition Conference Note  Date of the Conference: 11/09/2019  Patient Name: Tina Stone  Referring Provider: Dr. Ihor Dow Primary GYN Oncologist: Dr. Denman George  Stage/Disposition:  Stage IA grade 3 endometrial cancer with serous/clear cell. Disposition is to chemotherapy and vaginal brachytherapy. Her2 testing has been requested..   This Multidisciplinary conference took place involving physicians from [place], Medical Oncology, Radiation Oncology, Pathology, Radiology along with the Gynecologic Oncology Nurse Practitioner and RN.  Comprehensive assessment of the patient's malignancy, staging, need for surgery, chemotherapy, radiation therapy, and need for further testing were reviewed. Supportive measures, both inpatient and following discharge were also discussed. The recommended plan of care is documented. Greater than 35 minutes were spent correlating and coordinating this patient's care.

## 2019-11-09 NOTE — Telephone Encounter (Signed)
Tina Stone and scheduled appointment to see Dr. Alvy Bimler tomorrow at 11:30 am and discussed that Radiation Oncology will be calling her with an appointment.  She verbalized agreement and understanding.

## 2019-11-10 ENCOUNTER — Encounter: Payer: Self-pay | Admitting: Hematology and Oncology

## 2019-11-10 ENCOUNTER — Inpatient Hospital Stay: Payer: Medicare Other | Attending: Gynecologic Oncology | Admitting: Hematology and Oncology

## 2019-11-10 ENCOUNTER — Other Ambulatory Visit: Payer: Self-pay

## 2019-11-10 DIAGNOSIS — C541 Malignant neoplasm of endometrium: Secondary | ICD-10-CM

## 2019-11-10 DIAGNOSIS — Z90710 Acquired absence of both cervix and uterus: Secondary | ICD-10-CM | POA: Insufficient documentation

## 2019-11-10 DIAGNOSIS — Z90722 Acquired absence of ovaries, bilateral: Secondary | ICD-10-CM | POA: Insufficient documentation

## 2019-11-10 LAB — SURGICAL PATHOLOGY

## 2019-11-10 NOTE — Assessment & Plan Note (Signed)
I reviewed final pathology report with the patient Her case was presented at the GYN oncology tumor board and due to high risk features including poorly differentiated carcinoma, mixed histology with squamous differentiation, rare focus of clear cells as well as serous features, along with lymphovascular invasion, will put her at high risk of disease recurrence without systemic chemotherapy The patient appears to have difficulties understanding the role of adjuvant treatment She has appointment next week to meet with radiation oncologist for the role of adjuvant radiation therapy I emphasized that radiation therapy is not the same as chemotherapy and due to her young age and lack of comorbidities, I would advocate for adjuvant chemotherapy along with radiation therapy to reduce the risk of recurrence The patient is shocked and undecided She has appointment to meet with her GYN surgeon next week For now, I have not made a return appointment for her to come back If she is interested to pursue adjuvant chemotherapy, I recommend port placement along with chemo education class before we proceed with treatment  We reviewed the NCCN guidelines We discussed the role of chemotherapy. The intent is of curative intent.  We discussed some of the risks, benefits, side-effects of carboplatin & Taxol. Treatment is intravenous, every 3 weeks x 6 cycles  Some of the short term side-effects included, though not limited to, including weight loss, life threatening infections, risk of allergic reactions, need for transfusions of blood products, nausea, vomiting, change in bowel habits, loss of hair, admission to hospital for various reasons, and risks of death.   Long term side-effects are also discussed including risks of infertility, permanent damage to nerve function, hearing loss, chronic fatigue, kidney damage with possibility needing hemodialysis, and rare secondary malignancy including bone marrow  disorders.  The patient is aware that the response rates discussed earlier is not guaranteed.  After a long discussion, patient is undecided  Patient education material was dispensed.

## 2019-11-10 NOTE — Progress Notes (Signed)
[place] CONSULT NOTE  Patient Care Team: Luetta Nutting, DO as PCP - General (Family Medicine) Axel Filler, MD  ASSESSMENT & PLAN:  Endometrial cancer [facility]) I reviewed final pathology report with the patient Her case was presented at the GYN oncology tumor board and due to high risk features including poorly differentiated carcinoma, mixed histology with squamous differentiation, rare focus of clear cells as well as serous features, along with lymphovascular invasion, will put her at high risk of disease recurrence without systemic chemotherapy The patient appears to have difficulties understanding the role of adjuvant treatment She has appointment next week to meet with radiation oncologist for the role of adjuvant radiation therapy I emphasized that radiation therapy is not the same as chemotherapy and due to her young age and lack of comorbidities, I would advocate for adjuvant chemotherapy along with radiation therapy to reduce the risk of recurrence The patient is shocked and undecided She has appointment to meet with her GYN surgeon next week For now, I have not made a return appointment for her to come back If she is interested to pursue adjuvant chemotherapy, I recommend port placement along with chemo education class before we proceed with treatment  We reviewed the NCCN guidelines We discussed the role of chemotherapy. The intent is of curative intent.  We discussed some of the risks, benefits, side-effects of carboplatin & Taxol. Treatment is intravenous, every 3 weeks x 6 cycles  Some of the short term side-effects included, though not limited to, including weight loss, life threatening infections, risk of allergic reactions, need for transfusions of blood products, nausea, vomiting, change in bowel habits, loss of hair, admission to hospital for various reasons, and risks of death.   Long term side-effects are also discussed including risks of  infertility, permanent damage to nerve function, hearing loss, chronic fatigue, kidney damage with possibility needing hemodialysis, and rare secondary malignancy including bone marrow disorders.  The patient is aware that the response rates discussed earlier is not guaranteed.  After a long discussion, patient is undecided  Patient education material was dispensed.     No orders of the defined types were placed in this encounter.    CHIEF COMPLAINTS/PURPOSE OF CONSULTATION:  Newly diagnosed uterine cancer, for discussion about the role of adjuvant chemotherapy  HISTORY OF PRESENTING ILLNESS:  Tina Stone 69 y.o. 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 Summary of oncologic history is as follows: Oncology History Overview Note  MSI stable Her2 negative   Endometrial cancer ([place])  09/01/2019 Initial Diagnosis   The patient reported a history of postmenopausal bleeding that began 1 to 2 months before diagnosis   09/14/2019 Imaging   US pelvis 1. Enlarged uterus with numerous myometrial masses, presumably fibroids. 2. Endometrial thickness of 6.2 mm. In the setting of post-menopausal bleeding, endometrial sampling is indicated to exclude carcinoma. If results are benign, sonohysterogram should be considered for focal lesion work-up.  3. Nonvisualized ovaries   09/23/2019 Pathology Results   A. ENDOMETRIUM, BIOPSY:  - Poorly differentiated carcinoma   10/15/2019 Imaging   Ct scan of chest, abdomen and pelvis: No evidence of metastatic disease within the  chest, abdomen, or pelvis.   Enlarged fibroid uterus.   Colonic  diverticulosis. No radiographic evidence of diverticulitis.   Aortic and coronary artery atherosclerosis.     10/22/2019 Pathology Results   SURGICAL PATHOLOGY   FINAL MICROSCOPIC DIAGNOSIS:   A. UTERUS, BILATERAL TUBES AND OVARIES, HYSTERECTOMY:  Poorly differentiated carcinoma, 6.5 cm.  Lymphovascular involvement by tumor.  Carcinoma involves inner half of the myometrium.  Margins not involved.  Cervix, bilateral fallopian tubes and bilateral ovaries free of tumor.   B. LYMPH NODE, RIGHT EXTERNAL SENTINEL, BIOPSY:  One lymph node with no metastatic carcinoma (0/1).   C. LYMPH NODE, RIGHT PELVIC, BIOPSY  Five lymph nodes with no metastatic carcinoma (0/5).   D. LYMPH NODE, LEFT PELVIC, BIOPSY:  Five lymph nodes with no metastatic carcinoma (0/5).   E. LYMPH NODE, RIGHT PERI AORTIC, BIOPSY:  One lymph node with no metastatic carcinoma (0/1).   F. LYMPH NODE, LEFT PERI AORTIC, BIOPSY:  Five lymph nodes with no metastatic carcinoma (0/5).    ONCOLOGY TABLE:  UTERUS, CARCINOMA OR CARCINOSARCOMA   Procedure: Total hysterectomy with bilateral f-oophorectomy and sentinel  lymph nodes.  Histologic type: Poorly differentiated carcinoma, see comment.  Histologic Grade: High-grade, FIGO 3.  Myometrial invasion:       Depth of invasion: 13 mm       Myometrial thickness: 40 mm  Uterine Serosa Involvement: Not identified  Cervical stromal involvement: Not identified  Extent of involvement of other organs: Not identified  Lymphovascular invasion: Present  Regional Lymph Nodes:       Examined:     17 Sentinel                               0 non-sentinel                               17 total        Lymph nodes with metastasis: 0        Isolated tumor cells (<0.2 mm): 0        Micrometastasis:  (>0.2 mm and < 2.0 mm): 0        Macrometastasis: (>2.0 mm): 0  Representative Tumor Block: A5, A6, A7 and A8.  MMR / MSI testing: Pending  Pathologic Stage Classification (pTNM, AJCC  8th edition):  pT1a, pN0  Comments: The carcinoma is a high-grade poorly differentiated carcinoma which morphologically has predominantly serous features.  There are a few foci with squamous differentiation and a rare focus of clear cell features.  Immunohistochemistry for cytokeratin AE1/AE3 is performed on the sentinel lymph nodes and no positivity is identified.    10/22/2019 Surgery   Pre-operative Diagnosis: endometrial cancer grade 3   Post-operative Diagnosis: same,    Operation: Robotic-assisted laparoscopic total hysterectomy for uterus >250gm with bilateral salpingoophorectomy, SLN mapping, bilateral pelvic and para-aortic lymphadenectomy.   Surgeon: Donaciano Eva    Operative Findings:  : 16cm bulky fibroid uterus, normal ovaries bilaterally, no suspicious lymph nodes.      She is recovering well from surgery She has minimum incisional pain Denies further abnormal vaginal bleeding/discharge Her appetite is stable She denies recent changes in bowel habits  MEDICAL HISTORY:  Past Medical History:  Diagnosis Date  . Adenomatous colon polyp   . Allergic rhinitis, seasonal   . Allergy   . Beta thalassemia trait 11/25/2013  . Cancer ([place])  endometrial   . Cholelithiasis   . Class 3 obesity without serious comorbidity with body mass index (BMI) of 40.0 to 44.9 in adult 11/19/2012  . GERD (gastroesophageal reflux disease)   . HLD (hyperlipidemia)   . Hypercholesterolemia   . Hypertension 03/18/2017   no meds   . Intraductal papilloma of left breast    Patient underwent left needle-localized lumpectomy by Dr. Imogene Burn. Tsuei on 09/09/2013; pathology showed intraductal papilloma with no atypia or malignancy identified.  Marland Kitchen PONV (postoperative nausea and vomiting)   . Pre-diabetes    pt denies  . Uterine fibroid     SURGICAL HISTORY: Past Surgical History:  Procedure Laterality Date  . [place]   right  . BREAST EXCISIONAL BIOPSY Left 2014    benign  . BREAST LUMPECTOMY WITH NEEDLE LOCALIZATION Left 09/09/2013   Procedure: BREAST LUMPECTOMY WITH NEEDLE LOCALIZATION;  Surgeon: Imogene Burn. Georgette Dover, MD;  Location: [facility];  Service: General;  Laterality: Left;  . CHOLECYSTECTOMY    . COLONOSCOPY    . POLYPECTOMY    . ROBOTIC ASSISTED LAPAROSCOPIC CHOLECYSTECTOMY  09/09/2019  . ROBOTIC ASSISTED TOTAL HYSTERECTOMY WITH BILATERAL SALPINGO OOPHERECTOMY N/A 10/22/2019   Procedure: XI ROBOTIC ASSISTED TOTAL HYSTERECTOMY WITH BILATERAL SALPINGO OOPHORECTOMY GREATER THAN 250 GRAMS, MINI LAPARTOMY FOR SPECIMEN DELIVERY; PELVIC AND PERI-AORTIC LYMPHADENECTOMY;  Surgeon: Everitt Amber, MD;  Location: [facility];  Service: Gynecology;  Laterality: N/A;  . SENTINEL NODE BIOPSY N/A 10/22/2019   Procedure: SENTINEL NODE BIOPSY;  Surgeon: Everitt Amber, MD;  Location: [facility];  Service: Gynecology;  Laterality: N/A;    SOCIAL HISTORY: Social History   Socioeconomic History  . Marital status: Widowed    Spouse name: Not on file  . Number of children: Not on file  . Years of education: Not on file  . Highest education level: Not on file  Occupational History  . Not on file  Social Needs  . Financial resource strain: Not on file  . Food insecurity    Worry: Not on file    Inability: Not on file  . Transportation needs    Medical: Not on file    Non-medical: Not on file  Tobacco Use  . Smoking status: Former Research scientist (life sciences)  . Smokeless tobacco: Never Used  . Tobacco comment: few puffs but not a true smoker quit many yrs ago  Substance and Sexual Activity  . Alcohol use: Never    Frequency: Never  . Drug use: Never  . Sexual activity: Not Currently  Lifestyle  . Physical activity    Days per week: Not on file    Minutes per session: Not on file  . Stress: Not on file  Relationships  . Social Herbalist on phone: Not on file    Gets together: Not on file    Attends religious service: Not on file    Active member of club or organization:  Not on file    Attends meetings of clubs or organizations: Not on file    Relationship status: Not on file  . Intimate partner violence    Fear of current or ex partner: Not on file    Emotionally abused: Not on file    Physically abused: Not on file    Forced sexual activity: Not on file  Other Topics Concern  . 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 Diabetes Mother   . Hypertension Mother   . Colon polyps Mother 49  . Dementia Mother 61  . Diabetes Father   . Congestive Heart Failure Father   . Pancreatic cancer Paternal Aunt   . Colon cancer Neg Hx   . Breast cancer Neg Hx   . Lung cancer Neg Hx   . Esophageal cancer Neg Hx   . Rectal cancer Neg Hx   . Stomach cancer Neg Hx     ALLERGIES:  is allergic to benicar hct [olmesartan medoxomil-hctz]; bactrim [sulfamethoxazole-trimethoprim]; pravastatin; shellfish allergy; and amlodipine.  MEDICATIONS:  Current Outpatient Medications  Medication Sig Dispense Refill  . APPLE CIDER VINEGAR PO Take 5 mLs by mouth daily.     . chlorthalidone (HYGROTON) 25 MG tablet Take 0.5 tablets (12.5 mg total) by mouth daily. 30 tablet 1  . HYDROcodone-acetaminophen (NORCO/VICODIN) 5-325 MG tablet Take 1 tablet by mouth every 4 (four) hours as needed for severe pain. For AFTER surgery only, do not take and drive 10 tablet 0  . ibuprofen (ADVIL) 600 MG tablet Take 1 tablet (600 mg total) by mouth every 6 (six) hours as needed for moderate pain. For AFTER surgery 30 tablet 0  . senna-docusate (SENOKOT-S) 8.6-50 MG tablet Take 2 tablets by mouth at bedtime. For AFTER surgery, do not take if having diarrhea 30 tablet 1   No current facility-administered medications for this visit.     REVIEW OF SYSTEMS:   Constitutional: Denies fevers, chills or  abnormal night sweats Eyes: Denies blurriness of vision, double vision or watery eyes Ears, nose, mouth, throat, and face: Denies mucositis or sore throat Respiratory: Denies cough, dyspnea or wheezes Cardiovascular: Denies palpitation, chest discomfort or lower extremity swelling Gastrointestinal:  Denies nausea, heartburn or change in bowel habits Skin: Denies abnormal skin rashes Lymphatics: Denies new lymphadenopathy or easy bruising Neurological:Denies numbness, tingling or new weaknesses Behavioral/Psych: Mood is stable, no new changes  All other systems were reviewed with the patient and are negative.  PHYSICAL EXAMINATION: ECOG PERFORMANCE STATUS: 1 - Symptomatic but completely ambulatory  Vitals:   11/10/19 1108  BP: 140/83  Pulse: 75  Resp: 18  Temp: 97.8 F (36.6 C)  SpO2: 100%   Filed Weights   11/10/19 1108  Weight: 209 lb 11.2 oz (95.1 kg)    GENERAL:alert, no distress and comfortable SKIN: skin color, texture, turgor are normal, no rashes or significant lesions EYES: normal, conjunctiva are pink and non-injected, sclera clear OROPHARYNX:no exudate, no erythema and lips, buccal mucosa, and tongue normal  NECK: supple, thyroid normal size, non-tender, without nodularity LYMPH:  no palpable lymphadenopathy in the cervical, axillary or inguinal LUNGS: clear to auscultation and percussion with normal breathing effort HEART: regular rate & rhythm and no murmurs and no lower extremity edema ABDOMEN:abdomen soft, non-tender and normal bowel sounds.  Well-healed surgical scar Musculoskeletal:no cyanosis of digits and no clubbing  PSYCH: alert & oriented x 3 with fluent speech.  She appears very anxious NEURO: no focal motor/sensory deficits  LABORATORY DATA:  I have reviewed the data as listed Lab Results  Component Value Date   WBC 9.1 10/23/2019   HGB 11.0 (L) 10/23/2019   HCT 35.5 (L) 10/23/2019   MCV 79.4 (L) 10/23/2019   PLT 209 10/23/2019   Recent Labs     07/18/19 2343  09/08/19 0833 10/21/19 1140 10/23/19 0314  NA 135 138 136 136  K 3.5 3.8 3.7 3.5  CL 100 99 98 100  CO2 '23 26 29 26  ' GLUCOSE 129* 92 93 164*  BUN '10 8 13 12  ' CREATININE 0.78 0.93 0.89 0.89  CALCIUM 8.9 9.2 9.2 8.4*  GFRNONAA >60 >60 >60 >60  GFRAA >60 >60 >60 >60  PROT 7.5 7.5 7.4  --   ALBUMIN 4.0 3.8 3.7  --   AST '21 19 20  ' --   ALT '15 12 14  ' --   ALKPHOS 82 79 81  --   BILITOT 0.8 0.4 0.5  --     RADIOGRAPHIC STUDIES: I have personally reviewed the radiological images as listed and agreed with the findings in the report. Ct Chest W Contrast  Result Date: 10/15/2019 CLINICAL DATA:  High-grade endometrial carcinoma. Staging. EXAM: CT CHEST, ABDOMEN, AND PELVIS WITH CONTRAST TECHNIQUE: Multidetector CT imaging of the chest, abdomen and pelvis was performed following the standard protocol during bolus administration of intravenous contrast. CONTRAST:  12m OMNIPAQUE IOHEXOL 300 MG/ML  SOLN COMPARISON:  None. FINDINGS: CT CHEST FINDINGS Cardiovascular: No acute findings. Aortic and coronary artery atherosclerosis. Mediastinum/Lymph Nodes: No masses or pathologically enlarged lymph nodes identified. Lungs/Pleura: No pulmonary infiltrate or mass identified. No effusion present. Musculoskeletal:  No suspicious bone lesions identified. CT ABDOMEN AND PELVIS FINDINGS Hepatobiliary: Several small hepatic cysts are noted. No masses identified. Pancreas:  No mass or inflammatory changes. Spleen:  Within normal limits in size and appearance. Adrenals/Urinary tract: Tiny sub-cm right renal cyst noted. No masses or hydronephrosis. Stomach/Bowel: No evidence of obstruction, inflammatory process, or abnormal fluid collections. Diverticulosis is seen mainly involving the sigmoid colon, however there is no evidence of diverticulitis. Vascular/Lymphatic: No pathologically enlarged lymph nodes identified. No abdominal aortic aneurysm. Aortic atherosclerosis. Reproductive: Uterus is  enlarged, with diffuse involvement by uterine fibroids seen, some which are partially calcified. Largest fibroid measures approximately 10 cm. No adnexal masses are identified. No evidence of free fluid. Other:  None. Musculoskeletal:  No suspicious bone lesions identified. IMPRESSION: No evidence of metastatic disease within the chest, abdomen, or pelvis. Enlarged fibroid uterus. Colonic diverticulosis. No radiographic evidence of diverticulitis. Aortic and coronary artery atherosclerosis. Electronically Signed   By: JMarlaine HindM.D.   On: 10/15/2019 15:31   Ct Abdomen Pelvis W Contrast  Result Date: 10/15/2019 CLINICAL DATA:  High-grade endometrial carcinoma. Staging. EXAM: CT CHEST, ABDOMEN, AND PELVIS WITH CONTRAST TECHNIQUE: Multidetector CT imaging of the chest, abdomen and pelvis was performed following the standard protocol during bolus administration of intravenous contrast. CONTRAST:  1063mOMNIPAQUE IOHEXOL 300 MG/ML  SOLN COMPARISON:  None. FINDINGS: CT CHEST FINDINGS Cardiovascular: No acute findings. Aortic and coronary artery atherosclerosis. Mediastinum/Lymph Nodes: No masses or pathologically enlarged lymph nodes identified. Lungs/Pleura: No pulmonary infiltrate or mass identified. No effusion present. Musculoskeletal:  No suspicious bone lesions identified. CT ABDOMEN AND PELVIS FINDINGS Hepatobiliary: Several small hepatic cysts are noted. No masses identified. Pancreas:  No mass or inflammatory changes. Spleen:  Within normal limits in size and appearance. Adrenals/Urinary tract: Tiny sub-cm right renal cyst noted. No masses or hydronephrosis. Stomach/Bowel: No evidence of obstruction, inflammatory process, or abnormal fluid collections. Diverticulosis is seen mainly involving the sigmoid colon, however there is no evidence of diverticulitis. Vascular/Lymphatic: No pathologically enlarged lymph nodes identified. No abdominal aortic aneurysm. Aortic atherosclerosis. Reproductive: Uterus is  enlarged, with diffuse involvement by uterine fibroids seen, some which are partially calcified. Largest fibroid  measures approximately 10 cm. No adnexal masses are identified. No evidence of free fluid. Other:  None. Musculoskeletal:  No suspicious bone lesions identified. IMPRESSION: No evidence of metastatic disease within the chest, abdomen, or pelvis. Enlarged fibroid uterus. Colonic diverticulosis. No radiographic evidence of diverticulitis. Aortic and coronary artery atherosclerosis. Electronically Signed   By: Marlaine Hind M.D.   On: 10/15/2019 15:31    I spent 55 minutes counseling the patient face to face. The total time spent in the appointment was 60 minutes and more than 50% was on counseling.  All questions were answered. The patient knows to call the clinic with any problems, questions or concerns.  [name], MD 11/10/2019 12:25 PM

## 2019-11-11 ENCOUNTER — Encounter (HOSPITAL_COMMUNITY): Payer: Self-pay

## 2019-11-17 ENCOUNTER — Inpatient Hospital Stay (HOSPITAL_BASED_OUTPATIENT_CLINIC_OR_DEPARTMENT_OTHER): Payer: Medicare Other | Admitting: Gynecologic Oncology

## 2019-11-17 ENCOUNTER — Encounter: Payer: Self-pay | Admitting: Gynecologic Oncology

## 2019-11-17 ENCOUNTER — Other Ambulatory Visit: Payer: Self-pay

## 2019-11-17 VITALS — BP 123/72 | HR 85 | Temp 98.5°F | Resp 18 | Ht 64.0 in | Wt 210.9 lb

## 2019-11-17 DIAGNOSIS — C541 Malignant neoplasm of endometrium: Secondary | ICD-10-CM

## 2019-11-17 DIAGNOSIS — Z7189 Other specified counseling: Secondary | ICD-10-CM

## 2019-11-17 DIAGNOSIS — Z90721 Acquired absence of ovaries, unilateral: Secondary | ICD-10-CM

## 2019-11-17 DIAGNOSIS — Z90722 Acquired absence of ovaries, bilateral: Secondary | ICD-10-CM

## 2019-11-17 MED ORDER — SILVER SULFADIAZINE 1 % EX CREA: 1.0000 "application " | TOPICAL_CREAM | Freq: Every day | CUTANEOUS | 0 refills | Status: DC

## 2019-11-17 NOTE — Patient Instructions (Signed)
Please notify Dr Denman George at phone number [phone] if you notice vaginal bleeding, new pelvic or abdominal pains, bloating, feeling full easy, or a change in bladder or bowel function.   Please avoid intercourse for 1 more month.  It is safe to return to work on December 1st.  Please return to see Dr Denman George in April.

## 2019-11-17 NOTE — Progress Notes (Signed)
Gynecologic Oncology Follow-up  Chief Complaint:  Chief Complaint  Patient presents with  . Endometrial cancer [facility])   Assessment/Plan:  Tina. Tina Stone Stone  is a 69 y.o.  year old with stage IA grade 3 (serous/clear cell) endometrial cancer. High/intermediate risk factors for recurrence. Recommendation was for vaginal brachytherapy and adjuvant chemotherapy to reduce risk for local recurrence in accordance with NCCN guidelines. However patient declined.   She is interested in seeking treatment if it recurs. I explained that chance of cure/salvage is less in the recurrent setting than if recurrence is prevented with adjuvant therapy. However, she is determined to avoid adjuvant therapy at present.   Silvadine for burn prescribed  I will see her back for follow-up in 4 months.   HPI: Tina Stone Stone is a 69 year old P0 who is seen in consultation at the request of Dr Ihor Dow for grade 3 poorly differentiated endometrial cancer.  The patient reported a history of postmenopausal bleeding that began 1 to 2 months before diagnosis.  She presented for a transvaginal ultrasound scan on September 23, 2019 which revealed a fibroid uterus measuring 6.8 x 7.3 x 14.5 cm.  The endometrium was thickened at 6.2 mm.  The left and ovary and right ovaries were not seen.  This prompted a endometrial biopsy to be performed on the same day, 09/23/2019, which revealed high-grade poorly differentiated carcinoma consistent with FIGO grade 3.  The patient has had a recent history of a robotic cholecystectomy in September 2020 with Dr. Josie Saunders.  This was an uncomplicated procedure.  She otherwise has not had any abdominal surgery.  Her gynecologic history is significant for her being nulliparous, she has never had surgical procedures for her fibroid uterus.  She transition through menopause in her 58s.  She continues to have intermittent menopausal symptoms.  Her medical history is otherwise remarkable  for hypertension and gastroesophageal reflux disease.  Her primary care physician is Dr. Luetta Nutting.  Her family history is unremarkable for GYN or colonic malignancies.  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  On October 22, 2019 she underwent a robotic assisted total laparoscopic hysterectomy for uterus greater than 250 g, bilateral salpingo-oophorectomy, sentinel node mapping, bilateral pelvic and periotic lymphadenectomy.  Intraoperative findings were significant for 16 cm bulky fibroid uterus with normal ovaries bilaterally no suspicious lymph nodes.  Mini laparotomy was required for specimen delivery.  Surgery was uncomplicated and she was discharged on postoperative day 1.  Final pathology revealed a poorly differentiated carcinoma, FIGO grade 3.  Morphologically it is predominantly serous features with a few foci of squamous differentiation and rare foci of clear cell features.  Pelvic and periaortic lymph nodes were negative for metastases as were the adnexa and cervix.  The tumor exhibited 13 of 40 mm (inner half) myometrial invasion.  Lymphovascular space invasion was present.  MSI testing is pending.  She was determined to have stage Ia grade 3 serous carcinoma with possible clear cell features of the endometrium.  Given the high risk features adjuvant therapy was recommended in accordance with NCCN guidelines.  Interval Hx:  The patient met with Dr. Alvy Bimler to discuss chemotherapy.  However she feels strongly that she would not like to proceed with chemotherapy or radiation treatment because she wants to leave her outcome in God's hands and does not want to incur toxicity of therapy in the process.  I counseled the patient about the benefits of adjuvant therapy including  explanation regarding the lower likelihood of successful salvage when waiting for recurrence.  She expressed understanding of this.  She has no specific concerns related  to her surgery other than a small left lower abdominal skin burn.  Current Meds:  Outpatient Encounter Medications as of 11/17/2019  Medication Sig  . APPLE CIDER VINEGAR PO Take 5 mLs by mouth daily.   . chlorthalidone (HYGROTON) 25 MG tablet Take 0.5 tablets (12.5 mg total) by mouth daily.  Marland Kitchen ibuprofen (ADVIL) 600 MG tablet Take 1 tablet (600 mg total) by mouth every 6 (six) hours as needed for moderate pain. For AFTER surgery  . senna-docusate (SENOKOT-S) 8.6-50 MG tablet Take 2 tablets by mouth at bedtime. For AFTER surgery, do not take if having diarrhea  . [DISCONTINUED] HYDROcodone-acetaminophen (NORCO/VICODIN) 5-325 MG tablet Take 1 tablet by mouth every 4 (four) hours as needed for severe pain. For AFTER surgery only, do not take and drive   No facility-administered encounter medications on file as of 11/17/2019.     Allergy:  Allergies  Allergen Reactions  . Benicar Hct [Olmesartan Medoxomil-Hctz] Shortness Of Breath and Palpitations  . Bactrim [Sulfamethoxazole-Trimethoprim] Other (See Comments)    Abdominal pain, dizziness  . Pravastatin Other (See Comments)    Lower abdominal pain  . Shellfish Allergy Nausea And Vomiting  . Amlodipine Palpitations    Social Hx:   Social History   Socioeconomic History  . Marital status: Widowed    Spouse name: Not on file  . Number of children: Not on file  . Years of education: Not on file  . Highest education level: Not on file  Occupational History  . Not on file  Social Needs  . Financial resource strain: Not on file  . Food insecurity    Worry: Not on file    Inability: Not on file  . Transportation needs    Medical: Not on file    Non-medical: Not on file  Tobacco Use  . Smoking status: Former Research scientist (life sciences)  . Smokeless tobacco: Never Used  . Tobacco comment: few puffs but not a true smoker quit many yrs ago  Substance and Sexual Activity  . Alcohol use: Never    Frequency: Never  . Drug use: Never  . Sexual activity:  Not Currently  Lifestyle  . Physical activity    Days per week: Not on file    Minutes per session: Not on file  . Stress: Not on file  Relationships  . Social Herbalist on phone: Not on file    Gets together: Not on file    Attends religious service: Not on file    Active member of club or organization: Not on file    Attends meetings of clubs or organizations: Not on file    Relationship status: Not on file  . Intimate partner violence    Fear of current or ex partner: Not on file    Emotionally abused: Not on file    Physically abused: Not on file    Forced sexual activity: Not on file  Other Topics Concern  . 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 [place]   right  .  BREAST EXCISIONAL BIOPSY Left 2014   benign  . BREAST LUMPECTOMY WITH NEEDLE LOCALIZATION Left 09/09/2013   Procedure: BREAST LUMPECTOMY WITH NEEDLE LOCALIZATION;  Surgeon: Imogene Burn. Georgette Dover, MD;  Location: [facility];  Service: General;  Laterality: Left;  . CHOLECYSTECTOMY    . COLONOSCOPY    . POLYPECTOMY    . ROBOTIC ASSISTED LAPAROSCOPIC CHOLECYSTECTOMY  09/09/2019  . ROBOTIC ASSISTED TOTAL HYSTERECTOMY WITH BILATERAL SALPINGO OOPHERECTOMY N/A 10/22/2019   Procedure: XI ROBOTIC ASSISTED TOTAL HYSTERECTOMY WITH BILATERAL SALPINGO OOPHORECTOMY GREATER THAN 250 GRAMS, MINI LAPARTOMY FOR SPECIMEN DELIVERY; PELVIC AND PERI-AORTIC LYMPHADENECTOMY;  Surgeon: Everitt Amber, MD;  Location: [facility];  Service: Gynecology;  Laterality: N/A;  . SENTINEL NODE BIOPSY N/A 10/22/2019   Procedure: SENTINEL NODE BIOPSY;  Surgeon: Everitt Amber, MD;  Location: [facility];  Service: Gynecology;  Laterality: N/A;    Past Medical Hx:  Past Medical History:  Diagnosis Date  . Adenomatous colon polyp   .  Allergic rhinitis, seasonal   . Allergy   . Beta thalassemia trait 11/25/2013  . Cancer ([place])    endometrial   . Cholelithiasis   . Class 3 obesity without serious comorbidity with body mass index (BMI) of 40.0 to 44.9 in adult 11/19/2012  . GERD (gastroesophageal reflux disease)   . HLD (hyperlipidemia)   . Hypercholesterolemia   . Hypertension 03/18/2017   no meds   . Intraductal papilloma of left breast    Patient underwent left needle-localized lumpectomy by Dr. Imogene Burn. [place] on 09/09/2013; pathology showed intraductal papilloma with no atypia or malignancy identified.  Marland Kitchen PONV (postoperative nausea and vomiting)   . Pre-diabetes    pt denies  . Uterine fibroid     Past Gynecological History:  See HPI No LMP recorded. Patient is postmenopausal.  Family Hx:  Family History  Problem Relation Age of Onset  . Diabetes Mother   . Hypertension Mother   . Colon polyps Mother 30  . Dementia Mother 24  . Diabetes Father   . Congestive Heart Failure Father   . Pancreatic cancer Paternal Aunt   . Colon cancer Neg Hx   . Breast cancer Neg Hx   . Lung cancer Neg Hx   . Esophageal cancer Neg Hx   . Rectal cancer Neg Hx   . Stomach cancer Neg Hx     Review of Systems:  Constitutional  Feels well,    ENT Normal appearing ears and nares bilaterally Skin/Breast  No rash, sores, jaundice, itching, dryness Cardiovascular  No chest pain, shortness of breath, or edema  Pulmonary  No cough or wheeze.  Gastro Intestinal  No nausea, vomitting, or diarrhoea. No bright red blood per rectum, no abdominal pain, change in bowel movement, or constipation.  Genito Urinary  No frequency, urgency, dysuria, no bleeding Musculo Skeletal  No myalgia, arthralgia, joint swelling or pain  Neurologic  No weakness, numbness, change in gait,  Psychology  No depression, anxiety, insomnia.   Vitals:  Blood pressure 123/72, pulse 85, temperature 98.5 F (36.9 C), temperature source Temporal,  resp. rate 18, height _0  (1.626 Tina), weight 210 lb 14.4 oz (95.7 kg), SpO2 100 %.  Physical Exam: WD in NAD Neck  Supple NROM, without any enlargements.  Lymph Node Survey No cervical supraclavicular or inguinal adenopathy Cardiovascular  Pulse normal rate, regularity and rhythm. S1 and S2 normal.  Lungs  Clear to auscultation bilateraly, without wheezes/crackles/rhonchi. Good air movement.  Skin  No rash/lesions/breakdown  Psychiatry  Alert and oriented  to person, place, and time  Abdomen  Normoactive bowel sounds, abdomen soft, non-tender and obese without evidence of hernia. Well healed incisions. Small burn on left lower quadrant healing well (superficial).  Back No CVA tenderness Genito Urinary  Vulva/vagina: Normal external female genitalia.   No lesions. No discharge or bleeding.  Bladder/urethra:  No lesions or masses, well supported bladder  Vagina: well healed cuff. Sutures in tact Rectal  deferred Extremities  No bilateral cyanosis, clubbing or edema.   30 minutes of direct face to face counseling time was spent with the patient. This included discussion about prognosis, therapy recommendations and postoperative side effects and are beyond the scope of routine postoperative care.   Thereasa Solo, MD  11/17/2019, 4:32 PM

## 2019-11-18 ENCOUNTER — Ambulatory Visit
Admission: RE | Admit: 2019-11-18 | Discharge: 2019-11-18 | Disposition: A | Discharge status: Home / Self Care | Payer: Medicare Other | Source: Ambulatory Visit | Attending: Radiation Oncology | Admitting: Radiation Oncology

## 2019-11-18 ENCOUNTER — Ambulatory Visit: Payer: Medicare Other

## 2019-11-18 ENCOUNTER — Telehealth: Payer: Self-pay | Admitting: *Deleted

## 2019-11-18 ENCOUNTER — Ambulatory Visit: Payer: Medicare Other | Admitting: Radiation Oncology

## 2019-11-18 NOTE — Telephone Encounter (Signed)
Patient refused treatment for radiation oncology. Appointment was canceled with Dr. Sondra Come.

## 2019-11-19 ENCOUNTER — Encounter: Payer: Self-pay | Admitting: Family Medicine

## 2019-11-24 ENCOUNTER — Telehealth: Payer: Self-pay

## 2019-11-24 NOTE — Telephone Encounter (Signed)
This would need to come from her [place]

## 2019-11-24 NOTE — Telephone Encounter (Signed)
Copied from [place] [phone]. Topic: General - Inquiry >> Nov 24, 2019 12:07 PM Alease Frame wrote: Reason for CRM: Patient would like a Doc note confirming she can return to work . Please advise If so, can you forward to [employer] Fax number [phone]

## 2019-11-25 ENCOUNTER — Ambulatory Visit: Payer: Medicare Other | Admitting: Gynecologic Oncology

## 2019-12-08 ENCOUNTER — Telehealth: Payer: Self-pay | Admitting: Family Medicine

## 2019-12-08 NOTE — Telephone Encounter (Signed)
Is she having any symptoms leg pain/fatigue feeling when walking long distance, cramping sensation in leg with walking, cold toes or blue coloration to feet/toes?  If not having any symptoms I'm don't think this test is completely accurate.  I would like to follow this up with ABI testing to check this as well (test for peripheral artery disease).

## 2019-12-08 NOTE — Telephone Encounter (Signed)
Pt report leg cramping at times mostly on the left side and denied other symptoms but pt stated he nurse report of right legs being affected after did the test. Pt agree to another test if needed. Please advise.

## 2019-12-08 NOTE — Telephone Encounter (Signed)
Pt stated that the nurse wants the pt to follow up with her PCP on the next step base on peripheral artery disease test result--please give pt a call back.    Copied from [place] [phone]. Topic: General - Other >> Dec 08, 2019  9:01 AM Keene Breath wrote: Reason for CRM: Patient called to check to see if the office received a fax from her insurance for Dr. Zigmund Daniel to sign.  Please advise and call patient to discuss at [phone] >> Dec 08, 2019 11:25 AM Joon Pohle, LPN wrote: LVM for the pt to call back. We received 2 fax but do not see any place for Dr.matthews to sign.

## 2019-12-10 ENCOUNTER — Other Ambulatory Visit: Payer: Self-pay | Admitting: Family Medicine

## 2019-12-10 ENCOUNTER — Telehealth (HOSPITAL_COMMUNITY): Payer: Self-pay | Admitting: *Deleted

## 2019-12-10 ENCOUNTER — Ambulatory Visit: Payer: Medicare Other | Admitting: Gynecologic Oncology

## 2019-12-10 DIAGNOSIS — R6889 Other general symptoms and signs: Secondary | ICD-10-CM

## 2019-12-10 NOTE — Telephone Encounter (Signed)
The above patient or their representative was contacted and gave the following answers to these questions:         Do you have any of the following symptoms?    NO  Fever                    Cough                   Shortness of breath  Do  you have any of the following other symptoms?    muscle pain         vomiting,        diarrhea        rash         weakness        red eye        abdominal pain         bruising          bruising or bleeding              joint pain           severe headache    Have you been in contact with someone who was or has been sick in the past 2 weeks?  NO  Yes                 Unsure                         Unable to assess   Does the person that you were in contact with have any of the following symptoms?   Cough         shortness of breath           muscle pain         vomiting,            diarrhea            rash            weakness           fever            red eye           abdominal pain           bruising  or  bleeding                joint pain                severe headache                 COMMENTS OR ACTION PLAN FOR THIS PATIENT:   12/10/19 No per pt.

## 2019-12-14 ENCOUNTER — Ambulatory Visit (HOSPITAL_COMMUNITY)
Admission: RE | Admit: 2019-12-14 | Discharge: 2019-12-14 | Disposition: A | Discharge status: Home / Self Care | Payer: Medicare Other | Source: Ambulatory Visit | Attending: Family | Admitting: Family

## 2019-12-14 ENCOUNTER — Other Ambulatory Visit: Payer: Self-pay

## 2019-12-14 DIAGNOSIS — R6889 Other general symptoms and signs: Secondary | ICD-10-CM | POA: Diagnosis not present

## 2019-12-15 ENCOUNTER — Encounter: Payer: Self-pay | Admitting: Family Medicine

## 2020-01-05 ENCOUNTER — Other Ambulatory Visit: Payer: Self-pay | Admitting: Family Medicine

## 2020-01-05 MED FILL — CHLORTHALIDONE 25 MG TABS: 25 | 60 days supply | Qty: 30 | Fill #0

## 2020-01-14 ENCOUNTER — Telehealth: Payer: Self-pay | Admitting: Family Medicine

## 2020-01-14 NOTE — Telephone Encounter (Signed)
Copied from [place] [phone]. Topic: Appointment Scheduling - Transfer of Care >> Jan 14, 2020 11:09 AM Celene Kras wrote: Pt is requesting to transfer FROM: Dr. Luetta Nutting  Pt is requesting to transfer TO: Dr. Domingo Mend  Reason for requested transfer: Pts pcp is leaving the practice. Please advise.   Send CRM to patient's current PCP (transferring FROM).

## 2020-01-14 NOTE — Telephone Encounter (Signed)
Fine by me 

## 2020-01-15 NOTE — Telephone Encounter (Signed)
Gave pt number and she said she will call to set up appt

## 2020-01-18 ENCOUNTER — Telehealth: Payer: Self-pay

## 2020-01-18 NOTE — Telephone Encounter (Signed)
Ms Wallenhorst stated that she had a little vaginal spotting today on the toilet tissue x 1. She is not experiencing and cramping or lower abdominal pressure.  Denies any urinary symptoms. Afebrile Pt.'s surgery was 10-12-19. She has not had any intercourse. She stated that she has recently has been lifting heavy items around 15-20 lbs. Told her that this information will be reviewed with Zoila Shutter and will call her back with recommendations.  Lenna Sciara will be in the office tomorrow to review. Pt verbalized understanding.

## 2020-01-19 NOTE — Telephone Encounter (Signed)
Told Tina Stone that Dr. Denman George will see her on 02-05-20 1415 per Joylene John, NP Pt has not had any more spotting since yesterday.

## 2020-02-05 ENCOUNTER — Ambulatory Visit: Payer: Medicare Other | Attending: Internal Medicine

## 2020-02-05 ENCOUNTER — Inpatient Hospital Stay: Payer: Medicare Other | Attending: Gynecologic Oncology | Admitting: Gynecologic Oncology

## 2020-02-05 ENCOUNTER — Encounter: Payer: Self-pay | Admitting: Gynecologic Oncology

## 2020-02-05 ENCOUNTER — Other Ambulatory Visit: Payer: Self-pay

## 2020-02-05 VITALS — BP 150/74 | HR 74 | Temp 97.9°F | Resp 18 | Ht 64.0 in | Wt 219.3 lb

## 2020-02-05 DIAGNOSIS — D563 Thalassemia minor: Secondary | ICD-10-CM | POA: Insufficient documentation

## 2020-02-05 DIAGNOSIS — Z6841 Body Mass Index (BMI) 40.0 and over, adult: Secondary | ICD-10-CM | POA: Diagnosis not present

## 2020-02-05 DIAGNOSIS — Z87891 Personal history of nicotine dependence: Secondary | ICD-10-CM | POA: Insufficient documentation

## 2020-02-05 DIAGNOSIS — Z780 Asymptomatic menopausal state: Secondary | ICD-10-CM | POA: Diagnosis not present

## 2020-02-05 DIAGNOSIS — K219 Gastro-esophageal reflux disease without esophagitis: Secondary | ICD-10-CM | POA: Insufficient documentation

## 2020-02-05 DIAGNOSIS — E7800 Pure hypercholesterolemia, unspecified: Secondary | ICD-10-CM | POA: Insufficient documentation

## 2020-02-05 DIAGNOSIS — Z20822 Contact with and (suspected) exposure to covid-19: Secondary | ICD-10-CM

## 2020-02-05 DIAGNOSIS — Z90722 Acquired absence of ovaries, bilateral: Secondary | ICD-10-CM | POA: Insufficient documentation

## 2020-02-05 DIAGNOSIS — E785 Hyperlipidemia, unspecified: Secondary | ICD-10-CM | POA: Diagnosis not present

## 2020-02-05 DIAGNOSIS — Z9049 Acquired absence of other specified parts of digestive tract: Secondary | ICD-10-CM | POA: Insufficient documentation

## 2020-02-05 DIAGNOSIS — Z90710 Acquired absence of both cervix and uterus: Secondary | ICD-10-CM | POA: Insufficient documentation

## 2020-02-05 DIAGNOSIS — I10 Essential (primary) hypertension: Secondary | ICD-10-CM | POA: Diagnosis not present

## 2020-02-05 DIAGNOSIS — Z8542 Personal history of malignant neoplasm of other parts of uterus: Secondary | ICD-10-CM | POA: Insufficient documentation

## 2020-02-05 DIAGNOSIS — E669 Obesity, unspecified: Secondary | ICD-10-CM | POA: Insufficient documentation

## 2020-02-05 DIAGNOSIS — Z08 Encounter for follow-up examination after completed treatment for malignant neoplasm: Secondary | ICD-10-CM | POA: Diagnosis not present

## 2020-02-05 DIAGNOSIS — J309 Allergic rhinitis, unspecified: Secondary | ICD-10-CM | POA: Insufficient documentation

## 2020-02-05 DIAGNOSIS — C541 Malignant neoplasm of endometrium: Secondary | ICD-10-CM

## 2020-02-05 NOTE — Patient Instructions (Signed)
Please notify Dr Denman George at phone number [phone] if you notice vaginal bleeding, new pelvic or abdominal pains, bloating, feeling full easy, or a change in bladder or bowel function.   Please contact Dr Serita Grit office (at [phone]) in April to request an appointment with her for August, 2021.

## 2020-02-05 NOTE — Progress Notes (Signed)
Gynecologic Oncology Follow-up  Chief Complaint:  Chief Complaint  Patient presents with  . Endometrial cancer [facility])   Assessment/Plan:  [name]  is a 70 y.o.  year old with stage IA grade 3 (serous/clear cell) endometrial cancer. (MSI Stable/MMR normal) High/intermediate risk factors for recurrence. Patient declined recommendation for vaginal brachytherapy and adjuvant chemotherapy to reduce risk for local recurrence.  No evidence of disease on exam.  She is interested in seeking treatment if it recurs. I will see her back in 6 months.   HPI: [name] is a 70 year old P0 who is seen in consultation at the request of Dr Ihor Dow for grade 3 poorly differentiated endometrial cancer.  The patient reported a history of postmenopausal bleeding that began 1 to 2 months before diagnosis.  She presented for a transvaginal ultrasound scan on September 23, 2019 which revealed a fibroid uterus measuring 6.8 x 7.3 x 14.5 cm.  The endometrium was thickened at 6.2 mm.  The left and ovary and right ovaries were not seen.  This prompted a endometrial biopsy to be performed on the same day, 09/23/2019, which revealed high-grade poorly differentiated carcinoma consistent with FIGO grade 3.  The patient has had a recent history of a robotic cholecystectomy in September 2020 with Dr. Josie Saunders.  This was an uncomplicated procedure.  She otherwise has not had any abdominal surgery.  Her gynecologic history is significant for her being nulliparous, she has never had surgical procedures for her fibroid uterus.  She transition through menopause in her 30s.  She continues to have intermittent menopausal symptoms.  Her medical history is otherwise remarkable for hypertension and gastroesophageal reflux disease.  Her primary care physician is Dr. Luetta Nutting.  Her family history is unremarkable for GYN or colonic malignancies.  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  On October 22, 2019 she underwent a robotic assisted total laparoscopic hysterectomy for uterus greater than 250 g, bilateral salpingo-oophorectomy, sentinel node mapping, bilateral pelvic and periotic lymphadenectomy.  Intraoperative findings were significant for 16 cm bulky fibroid uterus with normal ovaries bilaterally no suspicious lymph nodes.  Mini laparotomy was required for specimen delivery.  Surgery was uncomplicated and she was discharged on postoperative day 1.  Final pathology revealed a poorly differentiated carcinoma, FIGO grade 3.  Morphologically it is predominantly serous features with a few foci of squamous differentiation and rare foci of clear cell features.  Pelvic and periaortic lymph nodes were negative for metastases as were the adnexa and cervix.  The tumor exhibited 13 of 40 mm (inner half) myometrial invasion.  Lymphovascular space invasion was present.  MSI testing is pending.  She was determined to have stage Ia grade 3 serous carcinoma with possible clear cell features of the endometrium.  Given the high risk features adjuvant therapy was recommended in accordance with NCCN guidelines.  The patient met with Dr. Alvy Bimler to discuss chemotherapy.  However she feels strongly that she would not like to proceed with chemotherapy or radiation treatment because she wants to leave her outcome in God's hands and does not want to incur toxicity of therapy in the process.  I counseled the patient about the benefits of adjuvant therapy including explanation regarding the lower likelihood of successful salvage when waiting for recurrence.  She expressed understanding of this.  Interval Hx:  In mid January 2021 she experienced episode of light pink or red discharge.  She thinks it might  have been associated with a vulvar caruncle.  It stopped immediately after beginning.  She has no other symptoms concerning for recurrence.  Current Meds:  Outpatient  Encounter Medications as of 02/05/2020  Medication Sig  . APPLE CIDER VINEGAR PO Take 5 mLs by mouth daily.   . chlorthalidone (HYGROTON) 25 MG tablet TAKE 0.5 TABLETS (12.5 MG TOTAL) BY MOUTH DAILY.  . Multiple Vitamin (MULTIVITAMIN) capsule Take 1 capsule by mouth daily.  . Omega-3 Fatty Acids (FISH OIL PO) Take 500 mg by mouth every other day.  Marland Kitchen OVER THE COUNTER MEDICATION Bioqueretin daily  . Probiotic Product (PROBIOTIC DAILY PO) Take 1 tablet by mouth every other day.  . bisacodyl (DUCODYL) 5 MG EC tablet Take 5 mg by mouth daily as needed for moderate constipation.  . silver sulfADIAZINE (SILVADENE) 1 % cream Apply 1 application topically daily. (Patient not taking: Reported on 02/05/2020)  . [DISCONTINUED] ibuprofen (ADVIL) 600 MG tablet Take 1 tablet (600 mg total) by mouth every 6 (six) hours as needed for moderate pain. For AFTER surgery  . [DISCONTINUED] senna-docusate (SENOKOT-S) 8.6-50 MG tablet Take 2 tablets by mouth at bedtime. For AFTER surgery, do not take if having diarrhea   No facility-administered encounter medications on file as of 02/05/2020.    Allergy:  Allergies  Allergen Reactions  . Benicar Hct [Olmesartan Medoxomil-Hctz] Shortness Of Breath and Palpitations  . Bactrim [Sulfamethoxazole-Trimethoprim] Other (See Comments)    Abdominal pain, dizziness  . Pravastatin Other (See Comments)    Lower abdominal pain  . Shellfish Allergy Nausea And Vomiting  . Amlodipine Palpitations    Social Hx:   Social History   Socioeconomic History  . Marital status: Widowed    Spouse name: Not on file  . Number of children: Not on file  . Years of education: Not on file  . Highest education level: Not on file  Occupational History  . Not on file  Tobacco Use  . Smoking status: Former Research scientist (life sciences)  . Smokeless tobacco: Never Used  . Tobacco comment: few puffs but not a true smoker quit many yrs ago  Substance and Sexual Activity  . Alcohol use: Never  . Drug use: Never  .  Sexual activity: Not Currently  Other Topics Concern  . 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 Difficulty of Paying Living Expenses: Not on file  Food Insecurity:   . Worried About Charity fundraiser in the Last Year: Not on file  . Ran Out of Food in the Last Year: Not on file  Transportation Needs:   . Lack of Transportation (Medical): Not on file  . Lack of Transportation (Non-Medical): Not on file  Physical Activity:   . Days of Exercise per Week: Not on file  . Minutes of Exercise per Session: Not on file  Stress:   . Feeling of Stress : Not on file  Social Connections:   . Frequency of Communication with Friends and Family: Not on file  . Frequency of Social Gatherings with Friends and Family: Not on file  . Attends Religious Services: Not on file  . Active Member of Clubs or Organizations: Not on file  . Attends Archivist Meetings: Not on file  .  Marital Status: Not on file  Intimate Partner Violence:   . Fear of Current or Ex-Partner: Not on file  . Emotionally Abused: Not on file  . Physically Abused: Not on file  . Sexually Abused: Not on file    Past Surgical Hx:  Past Surgical History:  Procedure Laterality Date  . [place]   right  . BREAST EXCISIONAL BIOPSY Left 2014   benign  . BREAST LUMPECTOMY WITH NEEDLE LOCALIZATION Left 09/09/2013   Procedure: BREAST LUMPECTOMY WITH NEEDLE LOCALIZATION;  Surgeon: Imogene Burn. Georgette Dover, MD;  Location: [place];  Service: General;  Laterality: Left;  . CHOLECYSTECTOMY    . COLONOSCOPY    . POLYPECTOMY    . ROBOTIC ASSISTED LAPAROSCOPIC CHOLECYSTECTOMY  09/09/2019  . ROBOTIC ASSISTED TOTAL HYSTERECTOMY WITH BILATERAL SALPINGO OOPHERECTOMY N/A 10/22/2019   Procedure: XI ROBOTIC ASSISTED TOTAL HYSTERECTOMY  WITH BILATERAL SALPINGO OOPHORECTOMY GREATER THAN 250 GRAMS, MINI LAPARTOMY FOR SPECIMEN DELIVERY; PELVIC AND PERI-AORTIC LYMPHADENECTOMY;  Surgeon: Everitt Amber, MD;  Location: [facility];  Service: Gynecology;  Laterality: N/A;  . SENTINEL NODE BIOPSY N/A 10/22/2019   Procedure: SENTINEL NODE BIOPSY;  Surgeon: Everitt Amber, MD;  Location: [facility];  Service: Gynecology;  Laterality: N/A;    Past Medical Hx:  Past Medical History:  Diagnosis Date  . Adenomatous colon polyp   . Allergic rhinitis, seasonal   . Allergy   . Beta thalassemia trait 11/25/2013  . Cancer ([place])    endometrial   . Cholelithiasis   . Class 3 obesity without serious comorbidity with body mass index (BMI) of 40.0 to 44.9 in adult 11/19/2012  . GERD (gastroesophageal reflux disease)   . HLD (hyperlipidemia)   . Hypercholesterolemia   . Hypertension 03/18/2017   no meds   . Intraductal papilloma of left breast    Patient underwent left needle-localized lumpectomy by Dr. Imogene Burn. Tsue[place] on 09/09/2013; pathology showed intraductal papilloma with no atypia or malignancy identified.  Marland Kitchen PONV (postoperative nausea and vomiting)   . Pre-diabetes    pt denies  . Uterine fibroid     Past Gynecological History:  See HPI No LMP recorded. Patient is postmenopausal.  Family Hx:  Family History  Problem Relation Age of Onset  . Diabetes Mother   . Hypertension Mother   . Colon polyps Mother 57  . Dementia Mother 59  . Diabetes Father   . Congestive Heart Failure Father   . Pancreatic cancer Paternal Aunt   . Colon cancer Neg Hx   . Breast cancer Neg Hx   . Lung cancer Neg Hx   . Esophageal cancer Neg Hx   . Rectal cancer Neg Hx   . Stomach cancer Neg Hx     Review of Systems:  Constitutional  Feels well,    ENT Normal appearing ears and nares bilaterally Skin/Breast  No rash, sores, jaundice, itching, dryness Cardiovascular  No chest pain, shortness of breath, or edema  Pulmonary  No cough or wheeze.  Gastro  Intestinal  No nausea, vomitting, or diarrhoea. No bright red blood per rectum, no abdominal pain, change in bowel movement, or constipation.  Genito Urinary  No frequency, urgency, dysuria, no bleeding Musculo Skeletal  No myalgia, arthralgia, joint swelling or pain  Neurologic  No weakness, numbness, change in gait,  Psychology  No depression, anxiety, insomnia.   Vitals:  Blood pressure (!) 150/74, pulse 74, temperature 97.9 F (36.6 C), temperature source Temporal, resp. rate 18, height 5' 4" (1.626 m),  weight 219 lb 4.8 oz (99.5 kg), SpO2 100 %.  Physical Exam: WD in NAD Neck  Supple NROM, without any enlargements.  Lymph Node Survey No cervical supraclavicular or inguinal adenopathy Cardiovascular  Pulse normal rate, regularity and rhythm. S1 and S2 normal.  Lungs  Clear to auscultation bilateraly, without wheezes/crackles/rhonchi. Good air movement.  Skin  No rash/lesions/breakdown  Psychiatry  Alert and oriented to person, place, and time  Abdomen  Normoactive bowel sounds, abdomen soft, non-tender and obese without evidence of hernia. Well healed incisions. Back No CVA tenderness Genito Urinary  Vulva/vagina: Normal external female genitalia.   No lesions. No discharge or bleeding.  Bladder/urethra:  No lesions or masses, well supported bladder  Vagina: smooth, soft, no lesions or masses, no blood. Rectal  deferred Extremities  No bilateral cyanosis, clubbing or edema.  Thereasa Solo, MD  02/05/2020, 3:12 PM

## 2020-02-07 LAB — NOVEL CORONAVIRUS, NAA: SARS-CoV-2, NAA: NOT DETECTED

## 2020-02-12 IMAGING — CT CT ABD-PELV W/ CM
2 of 5 series · 13 of 36 positions shown, 16 images · IV contrast (APPLIED)
Comparison: None.

CLINICAL DATA: High-grade endometrial carcinoma. Staging.

EXAM:
CT CHEST, ABDOMEN, AND PELVIS WITH CONTRAST
TECHNIQUE: Multidetector CT imaging of the chest, abdomen and pelvis was
performed following the standard protocol during bolus
administration of intravenous contrast.
CONTRAST:  100mL OMNIPAQUE IOHEXOL 300 MG/ML  SOLN

[Series 2: cap with · axial · 0.79mm/px · z∈[-597,-102]mm · 10 of 122 slices shown, 13 images]
[im 12/122  mediastinal]
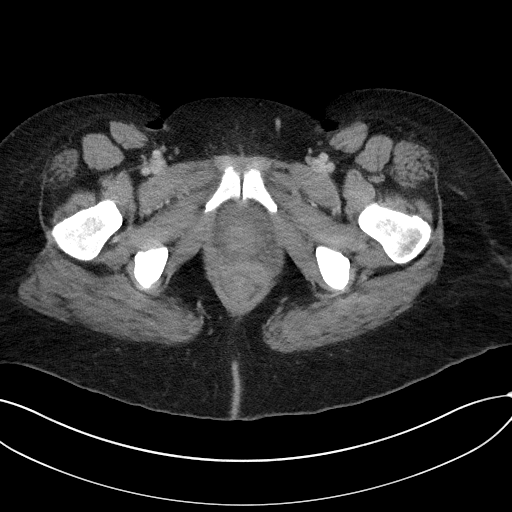
[im 12/122  lung]
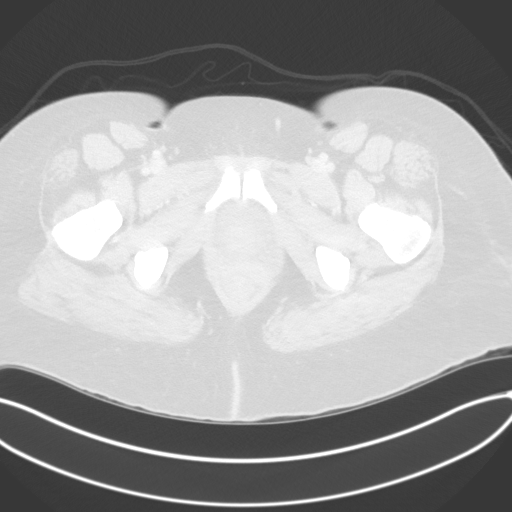
[im 23/122  lung]
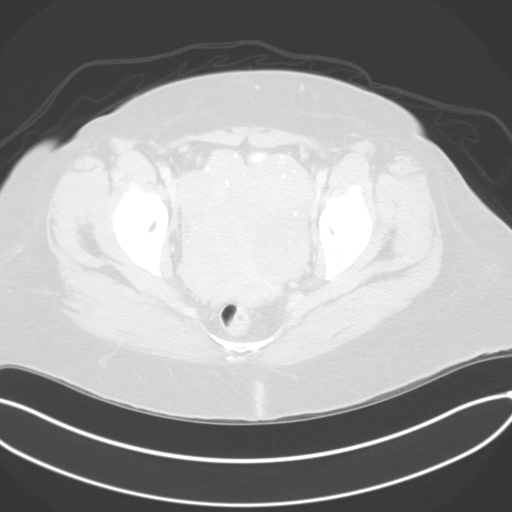
[im 34/122  lung]
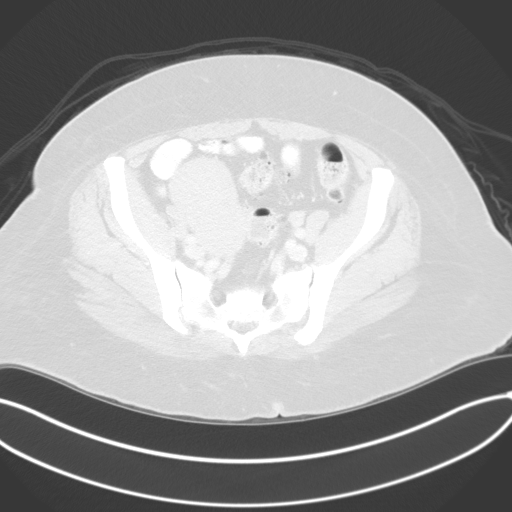
[im 45/122  lung]
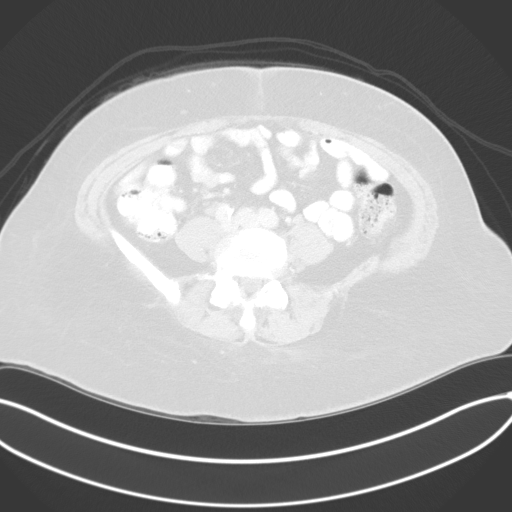
[im 56/122  mediastinal]
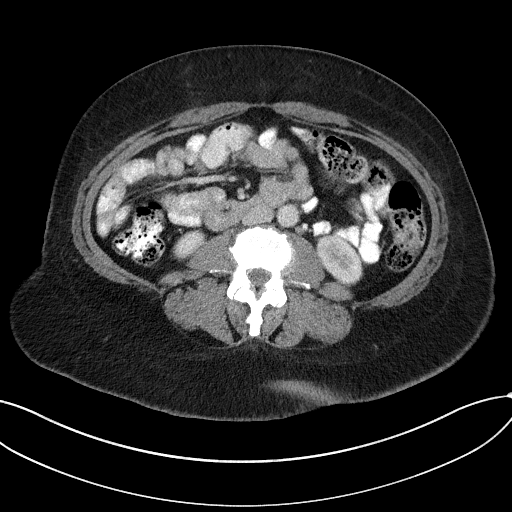
[im 56/122  lung]
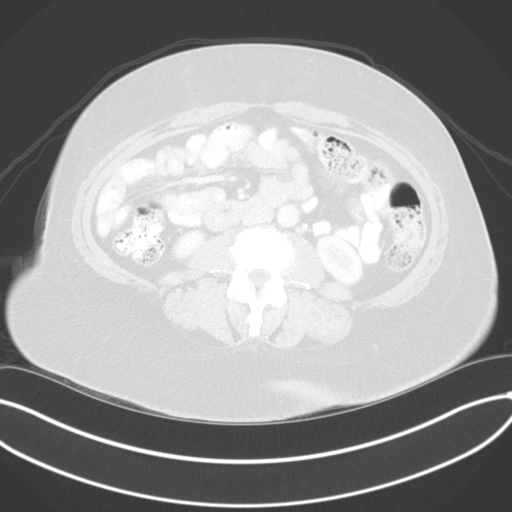
[im 67/122  lung]
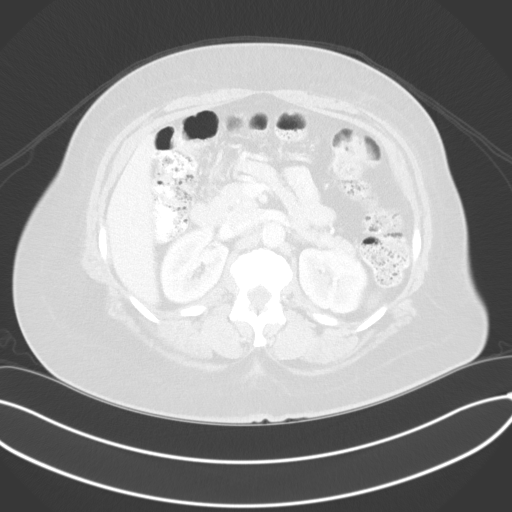
[im 78/122  lung]
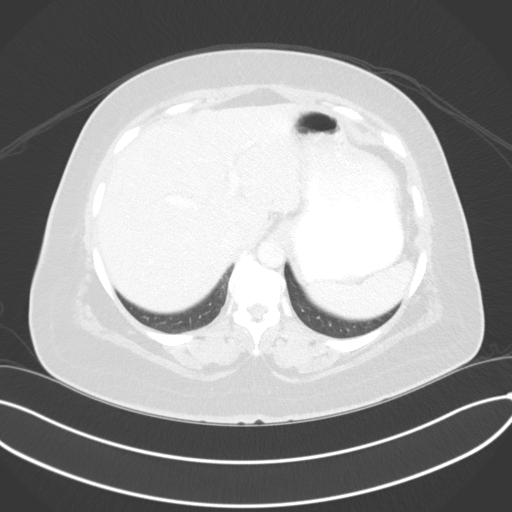
[im 89/122  lung]
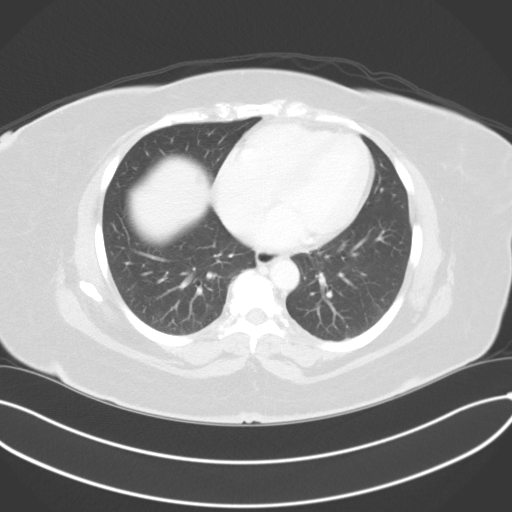
[im 100/122  mediastinal]
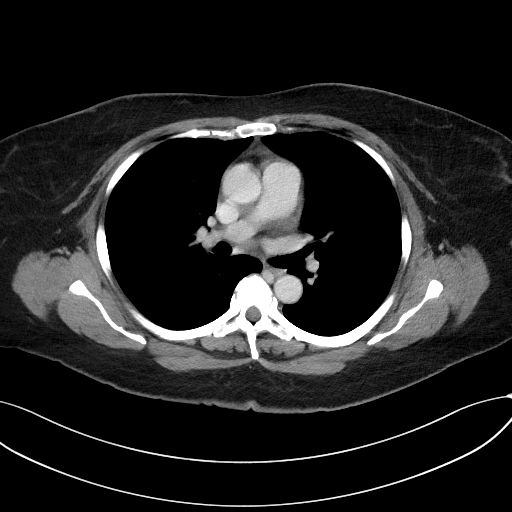
[im 100/122  lung]
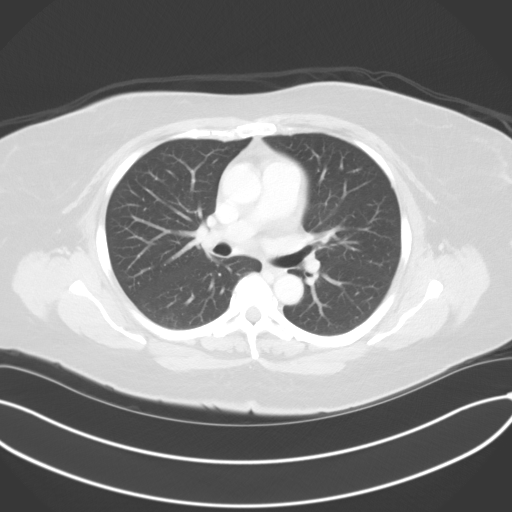
[im 111/122  lung]
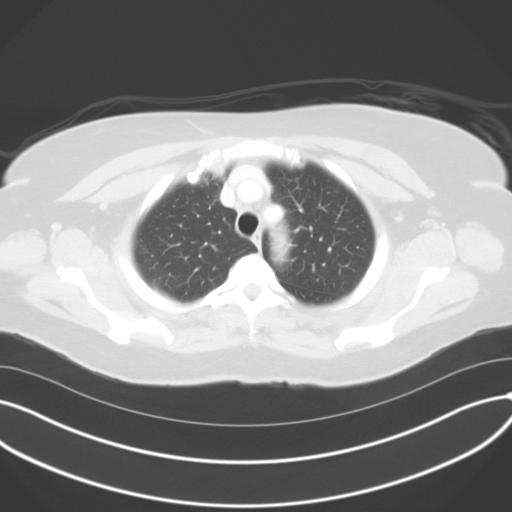

[Series 5: coronals · coronal · 0.76mm/px · 3 of 145 slices shown]
[im 29/145  lung]
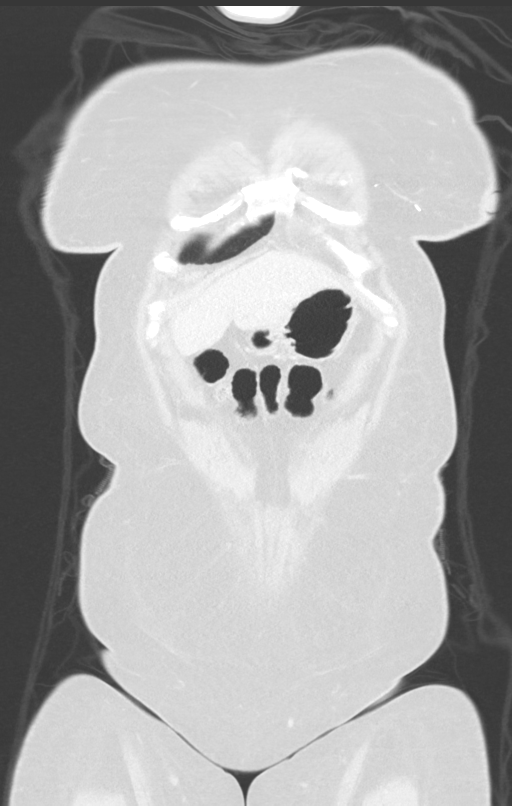
[im 58/145  lung]
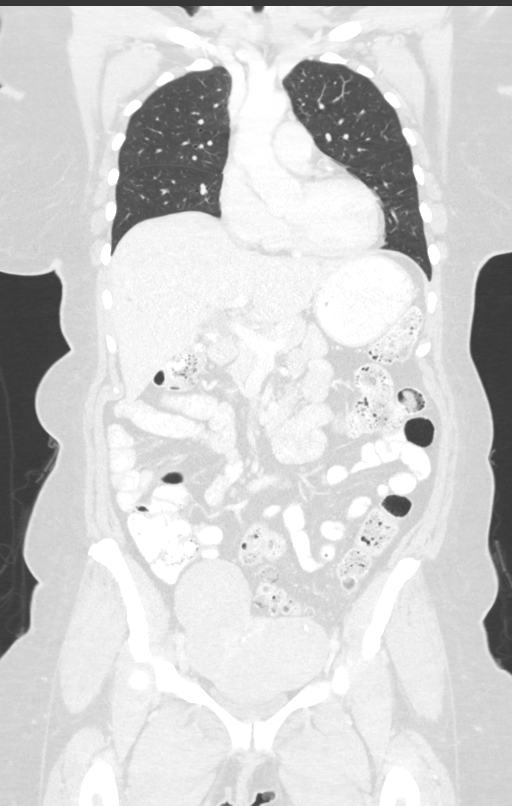
[im 87/145  lung]
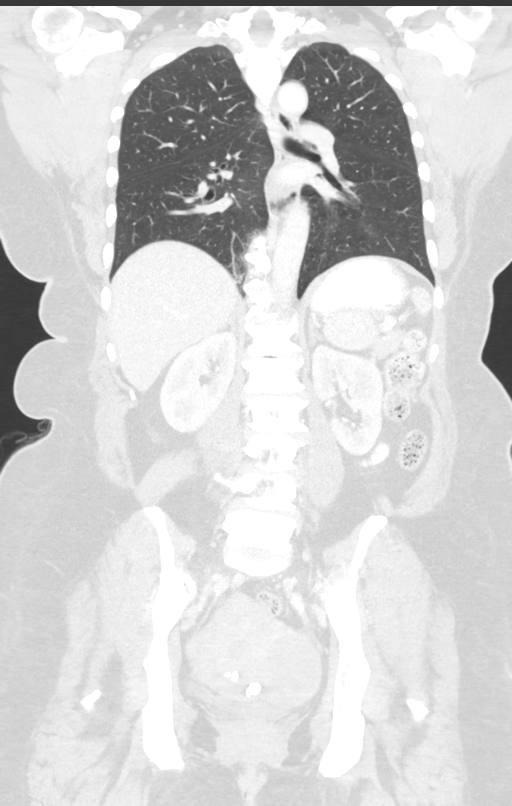

[13 of 36 positions shown; findings below may reference images not displayed]

FINDINGS: CT CHEST FINDINGS

Cardiovascular: No acute findings. Aortic and coronary artery
atherosclerosis.

Mediastinum/Lymph Nodes: No masses or pathologically enlarged lymph
nodes identified.

Lungs/Pleura: No pulmonary infiltrate or mass identified. No
effusion present.

Musculoskeletal:  No suspicious bone lesions identified.

CT ABDOMEN AND PELVIS FINDINGS

Hepatobiliary: Several small hepatic cysts are noted. No masses
identified.

Pancreas:  No mass or inflammatory changes.

Spleen:  Within normal limits in size and appearance.

Adrenals/Urinary tract: Tiny sub-cm right renal cyst noted. No
masses or hydronephrosis.

Stomach/Bowel: No evidence of obstruction, inflammatory process, or
abnormal fluid collections. Diverticulosis is seen mainly involving
the sigmoid colon, however there is no evidence of diverticulitis.

Vascular/Lymphatic: No pathologically enlarged lymph nodes
identified. No abdominal aortic aneurysm. Aortic atherosclerosis.

Reproductive: Uterus is enlarged, with diffuse involvement by
uterine fibroids seen, some which are partially calcified. Largest
fibroid measures approximately 10 cm. No adnexal masses are
identified. No evidence of free fluid.

Other:  None.

Musculoskeletal:  No suspicious bone lesions identified.
IMPRESSION: No evidence of metastatic disease within the chest, abdomen, or
pelvis.

Enlarged fibroid uterus.

Colonic diverticulosis. No radiographic evidence of diverticulitis.

Aortic and coronary artery atherosclerosis.

## 2020-03-03 ENCOUNTER — Ambulatory Visit: Payer: Medicare Other | Attending: Internal Medicine

## 2020-03-03 DIAGNOSIS — Z20822 Contact with and (suspected) exposure to covid-19: Secondary | ICD-10-CM

## 2020-03-04 LAB — NOVEL CORONAVIRUS, NAA: SARS-CoV-2, NAA: NOT DETECTED

## 2020-03-07 MED FILL — CHLORTHALIDONE 25 MG TABS: 25 | 60 days supply | Qty: 30 | Fill #1

## 2020-03-28 ENCOUNTER — Other Ambulatory Visit: Payer: Self-pay

## 2020-03-29 ENCOUNTER — Ambulatory Visit (INDEPENDENT_AMBULATORY_CARE_PROVIDER_SITE_OTHER): Payer: Medicare Other | Admitting: Internal Medicine

## 2020-03-29 ENCOUNTER — Encounter: Payer: Self-pay | Admitting: Internal Medicine

## 2020-03-29 VITALS — BP 120/70 | HR 72 | Temp 97.3°F | Wt 220.2 lb

## 2020-03-29 DIAGNOSIS — I10 Essential (primary) hypertension: Secondary | ICD-10-CM | POA: Diagnosis not present

## 2020-03-29 DIAGNOSIS — R7303 Prediabetes: Secondary | ICD-10-CM

## 2020-03-29 DIAGNOSIS — C541 Malignant neoplasm of endometrium: Secondary | ICD-10-CM

## 2020-03-29 DIAGNOSIS — Z6841 Body Mass Index (BMI) 40.0 and over, adult: Secondary | ICD-10-CM

## 2020-03-29 DIAGNOSIS — M7062 Trochanteric bursitis, left hip: Secondary | ICD-10-CM

## 2020-03-29 DIAGNOSIS — E785 Hyperlipidemia, unspecified: Secondary | ICD-10-CM | POA: Diagnosis not present

## 2020-03-29 DIAGNOSIS — J302 Other seasonal allergic rhinitis: Secondary | ICD-10-CM

## 2020-03-29 DIAGNOSIS — M25511 Pain in right shoulder: Secondary | ICD-10-CM

## 2020-03-29 DIAGNOSIS — M542 Cervicalgia: Secondary | ICD-10-CM

## 2020-03-29 NOTE — Progress Notes (Signed)
Established Patient Office Visit     This visit occurred during the SARS-CoV-2 public health emergency.  Safety protocols were in place, including screening questions prior to the visit, additional usage of staff PPE, and extensive cleaning of exam room while observing appropriate contact time as indicated for disinfecting solutions.    CC/Reason for Visit: Establish care, discuss chronic conditions and discuss some acute concerns  HPI: Toriana Mcmanamon is a 70 y.o. female who is coming in today for the above mentioned reasons. Past Medical History is significant for: Hypertension that has been well controlled on chlorthalidone, GERD not on PPI therapy, hyperlipidemia not on statins due to history of intolerance with myalgias, history of endometrial cancer status post hysterectomy in November 2020 followed by Dr. Denman George.  She was widowed in 2003, has no children, she is a never smoker, does not drink alcohol, her past surgical history is significant for hysterectomy and cholecystectomy.  Her family history is significant for mother and father with diabetes and hypertension.  She has allergies to Benicar, Bactrim, pravastatin, amlodipine.  She has some acute complaints today:  1.  She has been experiencing right neck and shoulder pain, has been working a lot on the computer and acknowledges that she has bad posture.  2.  Has been having a lot of sinus congestion and nasal drainage, sinus headache.  3.  Left hip pain that she notices more when sitting for prolonged periods of time, she is a side sleeper stop.   Past Medical/Surgical History: Past Medical History:  Diagnosis Date  . Adenomatous colon polyp   . Allergic rhinitis, seasonal   . Allergy   . Beta thalassemia trait 11/25/2013  . Cancer ([place])    endometrial   . Cholelithiasis   . Class 3 obesity without serious comorbidity with body mass index (BMI) of 40.0 to 44.9 in adult 11/19/2012  . GERD (gastroesophageal  reflux disease)   . HLD (hyperlipidemia)   . Hypercholesterolemia   . Hypertension 03/18/2017   no meds   . Intraductal papilloma of left breast    Patient underwent left needle-localized lumpectomy by Dr. Imogene Burn. Tsuei on 09/09/2013; pathology showed intraductal papilloma with no atypia or malignancy identified.  Marland Kitchen PONV (postoperative nausea and vomiting)   . Pre-diabetes    pt denies  . Uterine fibroid     Past Surgical History:  Procedure Laterality Date  . [place]   right  . BREAST EXCISIONAL BIOPSY Left 2014   benign  . BREAST LUMPECTOMY WITH NEEDLE LOCALIZATION Left 09/09/2013   Procedure: BREAST LUMPECTOMY WITH NEEDLE LOCALIZATION;  Surgeon: Imogene Burn. Georgette Dover, MD;  Location: [place];  Service: General;  Laterality: Left;  . CHOLECYSTECTOMY    . COLONOSCOPY    . POLYPECTOMY    . ROBOTIC ASSISTED LAPAROSCOPIC CHOLECYSTECTOMY  09/09/2019  . ROBOTIC ASSISTED TOTAL HYSTERECTOMY WITH BILATERAL SALPINGO OOPHERECTOMY N/A 10/22/2019   Procedure: XI ROBOTIC ASSISTED TOTAL HYSTERECTOMY WITH BILATERAL SALPINGO OOPHORECTOMY GREATER THAN 250 GRAMS, MINI LAPARTOMY FOR SPECIMEN DELIVERY; PELVIC AND PERI-AORTIC LYMPHADENECTOMY;  Surgeon: Everitt Amber, MD;  Location: [facility];  Service: Gynecology;  Laterality: N/A;  . SENTINEL NODE BIOPSY N/A 10/22/2019   Procedure: SENTINEL NODE BIOPSY;  Surgeon: Everitt Amber, MD;  Location: [facility];  Service: Gynecology;  Laterality: N/A;    Social History:  reports that she has quit smoking. She has never used smokeless tobacco. She reports that she does not drink alcohol or use drugs.  Allergies:  Allergies  Allergen Reactions  . Benicar Hct [Olmesartan Medoxomil-Hctz] Shortness Of Breath and Palpitations  . Bactrim [Sulfamethoxazole-Trimethoprim] Other (See Comments)    Abdominal pain, dizziness  . Pravastatin Other (See Comments)    Lower abdominal pain  . Shellfish Allergy Nausea And Vomiting  . Amlodipine Palpitations    Family  History:  Family History  Problem Relation Age of Onset  . Diabetes Mother   . Hypertension Mother   . Colon polyps Mother 37  . Dementia Mother 64  . Diabetes Father   . Congestive Heart Failure Father   . Pancreatic cancer Paternal Aunt   . Colon cancer Neg Hx   . Breast cancer Neg Hx   . Lung cancer Neg Hx   . Esophageal cancer Neg Hx   . Rectal cancer Neg Hx   . Stomach cancer Neg Hx      Current Outpatient Medications:  .  APPLE CIDER VINEGAR PO, Take 5 mLs by mouth daily. , Disp: , Rfl:  .  bisacodyl (DUCODYL) 5 MG EC tablet, Take 5 mg by mouth daily as needed for moderate constipation., Disp: , Rfl:  .  chlorthalidone (HYGROTON) 25 MG tablet, TAKE 0.5 TABLETS (12.5 MG TOTAL) BY MOUTH DAILY., Disp: 30 tablet, Rfl: 1 .  Multiple Vitamin (MULTIVITAMIN) capsule, Take 1 capsule by mouth daily., Disp: , Rfl:  .  Omega-3 Fatty Acids (FISH OIL PO), Take 500 mg by mouth every other day., Disp: , Rfl:  .  OVER THE COUNTER MEDICATION, Bioqueretin daily, Disp: , Rfl:  .  Probiotic Product (PROBIOTIC DAILY PO), Take 1 tablet by mouth every other day., Disp: , Rfl:  .  silver sulfADIAZINE (SILVADENE) 1 % cream, Apply 1 application topically daily., Disp: 50 g, Rfl: 0  Review of Systems:  Constitutional: Denies fever, chills, diaphoresis, appetite change and fatigue.  HEENT: Denies photophobia, eye pain, redness, hearing loss, ear pain, congestion, sore throat, rhinorrhea, sneezing, mouth sores, trouble swallowing, neck pain, neck stiffness and tinnitus.   Respiratory: Denies SOB, DOE, cough, chest tightness,  and wheezing.   Cardiovascular: Denies chest pain, palpitations and leg swelling.  Gastrointestinal: Denies nausea, vomiting, abdominal pain, diarrhea, constipation, blood in stool and abdominal distention.  Genitourinary: Denies dysuria, urgency, frequency, hematuria, flank pain and difficulty urinating.  Endocrine: Denies: hot or cold intolerance, sweats, changes in hair or nails,  polyuria, polydipsia. Musculoskeletal: Denies myalgias, back pain, joint swelling, arthralgias and gait problem.  Skin: Denies pallor, rash and wound.  Neurological: Denies dizziness, seizures, syncope, weakness, light-headedness, numbness and headaches.  Hematological: Denies adenopathy. Easy bruising, personal or family bleeding history  Psychiatric/Behavioral: Denies suicidal ideation, mood changes, confusion, nervousness, sleep disturbance and agitation    Physical Exam: Vitals:   03/29/20 0724  BP: 120/70  Pulse: 72  Temp: (!) 97.3 F (36.3 C)  TempSrc: Temporal  SpO2: 96%  Weight: 220 lb 3.2 oz (99.9 kg)    Body mass index is 37.8 kg/m.   Constitutional: NAD, calm, comfortable Eyes: PERRL, lids and conjunctivae normal ENMT: Mucous membranes are moist.  Respiratory: clear to auscultation bilaterally, no wheezing, no crackles. Normal respiratory effort. No accessory muscle use.  Cardiovascular: Regular rate and rhythm, no murmurs / rubs / gallops. No extremity edema. Abdomen: no tenderness, no masses palpated. No hepatosplenomegaly. Bowel sounds positive.  Musculoskeletal: no clubbing / cyanosis. No joint deformity upper and lower extremities. Good ROM, no contractures. Normal muscle tone.  Neurologic: Grossly intact and nonfocal. Psychiatric: Normal judgment and insight. Alert and oriented  x 3. Normal mood.    Impression and Plan:  Endometrial cancer ([place]) -Status post hysterectomy, follows with GYN oncology.  Hyperlipidemia, unspecified hyperlipidemia type -Last LDL was 191 in 2018, she states she has an intolerance to statins. -Will recheck LDL when she returns for physical, may need to investigate alternative therapy due to high risk for cardiovascular disease.  Essential hypertension -Well-controlled on chlorthalidone.  Class 3 severe obesity without serious comorbidity with body mass index (BMI) of 40.0 to 44.9 in adult, unspecified obesity type  ([place]) -Discussed healthy lifestyle, including increased physical activity and better food choices to promote weight loss.  Prediabetes -Last A1c was 5.5 in November 2020. -Recheck A1c when she returns for physical.  Trochanteric bursitis of left hip -Have discussed conservative measures including icing, avoidance of side sleeping. -Can consider oral prednisone taper versus steroid injection if no improvement.  Seasonal allergies -Have advised daily use of over-the-counter antihistamine.  Neck pain on right side Acute pain of right shoulder -We have discussed conservative measures such as local massage therapy, icing, ibuprofen, weighted shoulder pads. -She will return if no improvement.    Patient Instructions  -Nice seeing you today!!  -Schedule follow up for your wellness visit/physical combined in 4 weeks or as possible.  -May use allergy medication daily for your sinuses (allegra, zyrtec, claritin).  -For right neck/shoulder pain: icing, as needed ibuprofen, local massage therapy, weighted shoulder pad ([place]).       Lelon Frohlich, MD [facility]

## 2020-03-29 NOTE — Patient Instructions (Signed)
-  Nice seeing you today!!  -Schedule follow up for your wellness visit/physical combined in 4 weeks or as possible.  -May use allergy medication daily for your sinuses (allegra, zyrtec, claritin).  -For right neck/shoulder pain: icing, as needed ibuprofen, local massage therapy, weighted shoulder pad ([place]).

## 2020-03-31 ENCOUNTER — Ambulatory Visit: Payer: Medicare Other | Attending: Internal Medicine

## 2020-03-31 DIAGNOSIS — Z23 Encounter for immunization: Secondary | ICD-10-CM

## 2020-03-31 NOTE — Progress Notes (Signed)
   Covid-19 Vaccination Clinic  Name:  Tina Stone    MRN: [id number] DOB: 29-Mar-1950  03/31/2020  [name] was observed post Covid-19 immunization for 15 minutes without incident. She was provided with Vaccine Information Sheet and instruction to access the V-Safe system.   [name] was instructed to call 911 with any severe reactions post vaccine: Marland Kitchen Difficulty breathing  . Swelling of face and throat  . A fast heartbeat  . A bad rash all over body  . Dizziness and weakness   Immunizations Administered    Name Date Dose VIS Date Route   Pfizer COVID-19 Vaccine 03/31/2020  8:26 AM 0.3 mL 12/11/2019 Intramuscular   Manufacturer: [employer]   Lot: [id number]   [place]: [id number]

## 2020-04-12 ENCOUNTER — Inpatient Hospital Stay: Payer: Medicare Other | Admitting: Gynecologic Oncology

## 2020-04-21 ENCOUNTER — Encounter (HOSPITAL_COMMUNITY): Payer: Self-pay

## 2020-04-21 ENCOUNTER — Other Ambulatory Visit: Payer: Self-pay

## 2020-04-21 ENCOUNTER — Ambulatory Visit (HOSPITAL_COMMUNITY)
Admission: EM | Admit: 2020-04-21 | Discharge: 2020-04-21 | Disposition: A | Discharge status: Home / Self Care | Payer: Medicare Other | Attending: Family Medicine | Admitting: Family Medicine

## 2020-04-21 DIAGNOSIS — M25551 Pain in right hip: Secondary | ICD-10-CM

## 2020-04-21 MED ORDER — NABUMETONE 500 MG PO TABS: 500.0000 mg | ORAL_TABLET | Freq: Every day | ORAL | 0 refills | Status: DC

## 2020-04-21 MED ORDER — TIZANIDINE HCL 2 MG PO TABS: 2.0000 mg | ORAL_TABLET | Freq: Four times a day (QID) | ORAL | 0 refills | Status: DC | PRN

## 2020-04-21 MED FILL — NABUMETONE 500 MG TABLET: 500 | 30 days supply | Qty: 30 | Fill #0

## 2020-04-21 MED FILL — tiZANidine HCL 2 MG TABS: 2 | 8 days supply | Qty: 30 | Fill #0

## 2020-04-21 NOTE — ED Triage Notes (Signed)
Pt c/o 4/10 sore right hip pain. Pt denies injury. Pt was able to walk to exam room well.

## 2020-04-21 NOTE — ED Provider Notes (Signed)
[facility]    CSN: [id number] Arrival date & time: 04/21/20  [id number]      History   Chief Complaint Chief Complaint  Patient presents with  . Hip Pain    HPI Tina Stone is a 70 y.o. female.   Patient reports with right hip pain and tenderness for the last week.  Reports that she has seen her primary care physician for this and that she was recommended to do exercises.  Patient reports that she has not done this yet.  Patient concerned about possible blood clot or medication side effect as the cause of her pain.  Has been taking Tylenol with temporary relief.  Patient has medical history significant for hypertension, GERD, history of endometrial cancer, hyperlipidemia, obesity, beta thalassemia trait.  Denies headache, cough, shortness of breath, nausea, vomiting, diarrhea, rash, fever, other symptoms  ROS per HPI  The history is provided by the patient.  Hip Pain    Past Medical History:  Diagnosis Date  . Adenomatous colon polyp   . Allergic rhinitis, seasonal   . Allergy   . Beta thalassemia trait 11/25/2013  . Cancer ([place])    endometrial   . Cholelithiasis   . Class 3 obesity without serious comorbidity with body mass index (BMI) of 40.0 to 44.9 in adult 11/19/2012  . GERD (gastroesophageal reflux disease)   . HLD (hyperlipidemia)   . Hypercholesterolemia   . Hypertension 03/18/2017   no meds   . Intraductal papilloma of left breast    Patient underwent left needle-localized lumpectomy by Dr. Imogene Burn. [place] on 09/09/2013; pathology showed intraductal papilloma with no atypia or malignancy identified.  Marland Kitchen PONV (postoperative nausea and vomiting)   . Pre-diabetes    pt denies  . Uterine fibroid     Patient Active Problem List   Diagnosis Date Noted  . Endometrial cancer ([place]) 10/22/2019  . Postmenopausal bleeding 09/02/2019  . Biliary colic [id number]  . Breast pain, left 05/20/2019  . Trapezius muscle spasm 05/11/2019  . Elevated random blood  glucose level 02/13/2019  . Epigastric pain 01/29/2019  . Prediabetes 03/18/2017  . Hypertension 03/18/2017  . Beta thalassemia trait 11/25/2013  . Obesity 11/19/2012  . Women's annual routine gynecological examination 07/12/2011  . Gastroesophageal reflux disease 01/28/2007  . Hyperlipidemia 10/10/2006    Past Surgical History:  Procedure Laterality Date  . [place]   right  . BREAST EXCISIONAL BIOPSY Left 2014   benign  . BREAST LUMPECTOMY WITH NEEDLE LOCALIZATION Left 09/09/2013   Procedure: BREAST LUMPECTOMY WITH NEEDLE LOCALIZATION;  Surgeon: Imogene Burn. Georgette Dover, MD;  Location: [facility];  Service: General;  Laterality: Left;  . CHOLECYSTECTOMY    . COLONOSCOPY    . POLYPECTOMY    . ROBOTIC ASSISTED LAPAROSCOPIC CHOLECYSTECTOMY  09/09/2019  . ROBOTIC ASSISTED TOTAL HYSTERECTOMY WITH BILATERAL SALPINGO OOPHERECTOMY N/A 10/22/2019   Procedure: XI ROBOTIC ASSISTED TOTAL HYSTERECTOMY WITH BILATERAL SALPINGO OOPHORECTOMY GREATER THAN 250 GRAMS, MINI LAPARTOMY FOR SPECIMEN DELIVERY; PELVIC AND PERI-AORTIC LYMPHADENECTOMY;  Surgeon: Everitt Amber, MD;  Location: [facility];  Service: Gynecology;  Laterality: N/A;  . SENTINEL NODE BIOPSY N/A 10/22/2019   Procedure: SENTINEL NODE BIOPSY;  Surgeon: Everitt Amber, MD;  Location: [facility];  Service: Gynecology;  Laterality: N/A;    OB History    Gravida  1   Para  0   Term  0   Preterm  0   AB  1   Living  0  SAB  0   TAB  1   Ectopic  0   Multiple  0   Live Births  0            Home Medications    Prior to Admission medications   Medication Sig Start Date End Date Taking? Authorizing Provider  APPLE CIDER VINEGAR PO Take 5 mLs by mouth daily.     [provider]  bisacodyl (DUCODYL) 5 MG EC tablet Take 5 mg by mouth daily as needed for moderate constipation.    [provider]  chlorthalidone (HYGROTON) 25 MG tablet TAKE 0.5 TABLETS (12.5 MG TOTAL) BY MOUTH DAILY. 01/05/20   Luetta Nutting, DO  Multiple Vitamin (MULTIVITAMIN) capsule Take 1 capsule by mouth daily.    [provider]  nabumetone (RELAFEN) 500 MG tablet Take 1 tablet (500 mg total) by mouth daily. 04/21/20   Faustino Congress, NP  Omega-3 Fatty Acids (FISH OIL PO) Take 500 mg by mouth every other day.    [provider]  OVER THE COUNTER MEDICATION Bioqueretin daily    [provider]  Probiotic Product (PROBIOTIC DAILY PO) Take 1 tablet by mouth every other day.    [provider]  silver sulfADIAZINE (SILVADENE) 1 % cream Apply 1 application topically daily. 11/17/19   Everitt Amber, MD  tiZANidine (ZANAFLEX) 2 MG tablet Take 1 tablet (2 mg total) by mouth every 6 (six) hours as needed for muscle spasms. 04/21/20   Faustino Congress, NP    Family History Family History  Problem Relation Age of Onset  . Diabetes Mother   . Hypertension Mother   . Colon polyps Mother 72  . Dementia Mother 47  . Diabetes Father   . Congestive Heart Failure Father   . Pancreatic cancer Paternal Aunt   . Colon cancer Neg Hx   . Breast cancer Neg Hx   . Lung cancer Neg Hx   . Esophageal cancer Neg Hx   . Rectal cancer Neg Hx   . Stomach cancer Neg Hx     Social History Social History   Tobacco Use  . Smoking status: Never Smoker  . Smokeless tobacco: Never Used  . Tobacco comment: few puffs but not a true smoker quit many yrs ago  Substance Use Topics  . Alcohol use: Never  . Drug use: Never     Allergies   Benicar hct [olmesartan medoxomil-hctz], Bactrim [sulfamethoxazole-trimethoprim], Pravastatin, Shellfish allergy, and Amlodipine   Review of Systems Review of Systems   Physical Exam Triage Vital Signs ED Triage Vitals  Enc Vitals Group     BP 04/21/20 0814 (!) 159/86     Pulse Rate 04/21/20 0814 (!) 101     Resp 04/21/20 0814 16     Temp 04/21/20 0814 97.7 F (36.5 C)     Temp Source 04/21/20 0814 Oral     SpO2 04/21/20 0814 97 %     Weight 04/21/20 0815  220 lb (99.8 kg)     Height 04/21/20 0815 5\' 4"  (1.626 m)     Head Circumference --      Peak Flow --      Pain Score 04/21/20 0814 4     Pain Loc --      Pain Edu? --      Excl. in [place]? --    No data found.  Updated Vital Signs BP (!) 159/86   Pulse (!) 101   Temp 97.7 F (36.5 C) (Oral)  Resp 16   Ht 5\' 4"  (1.626 m)   Wt 220 lb (99.8 kg)   SpO2 97%   BMI 37.76 kg/m   Visual Acuity Right Eye Distance:   Left Eye Distance:   Bilateral Distance:    Right Eye Near:   Left Eye Near:    Bilateral Near:     Physical Exam Vitals and nursing note reviewed.  Constitutional:      General: She is not in acute distress.    Appearance: Normal appearance. She is well-developed. She is obese. She is not ill-appearing.  HENT:     Head: Normocephalic and atraumatic.     Nose: Nose normal.     Mouth/Throat:     Mouth: Mucous membranes are moist.     Pharynx: Oropharynx is clear.  Eyes:     Extraocular Movements: Extraocular movements intact.     Conjunctiva/sclera: Conjunctivae normal.     Pupils: Pupils are equal, round, and reactive to light.  Cardiovascular:     Rate and Rhythm: Normal rate and regular rhythm.     Heart sounds: Normal heart sounds. No murmur.  Pulmonary:     Effort: Pulmonary effort is normal. No respiratory distress.     Breath sounds: Normal breath sounds. No stridor. No wheezing, rhonchi or rales.  Chest:     Chest wall: No tenderness.  Abdominal:     General: Bowel sounds are normal.     Palpations: Abdomen is soft.     Tenderness: There is no abdominal tenderness.  Musculoskeletal:        General: Swelling and tenderness present.     Cervical back: Normal range of motion and neck supple.     Comments: Redness, tenderness of her right greater trochanter.  Also mild tenderness at piriformis.  Skin:    General: Skin is warm and dry.     Capillary Refill: Capillary refill takes less than 2 seconds.  Neurological:     General: No focal deficit  present.     Mental Status: She is alert and oriented to person, place, and time.  Psychiatric:        Mood and Affect: Mood normal.        Behavior: Behavior normal.      UC Treatments / Results  Labs (all labs ordered are listed, but only abnormal results are displayed) Labs Reviewed - No data to display  EKG   Radiology No results found.  Procedures Procedures (including critical care time)  Medications Ordered in UC Medications - No data to display  Initial Impression / Assessment and Plan / UC Course  I have reviewed the triage vital signs and the nursing notes.  Pertinent labs & imaging results that were available during my care of the patient were reviewed by me and considered in my medical decision making (see chart for details).    Right hip pain: Presents with right hip pain x1 week.  Temporary relief with Tylenol.  Tenderness at right greater trochanter as well as right piriformis to palpation.  Patient is limping when walking.  Trochanteric bursitis versus piriformis syndrome.  We will send an anti-inflammatory as well as muscle relaxer to pharmacy.  Discussed how to take these medications with the patient.  Also gave handout with exercises for piriformis syndrome.  Patient instructed to follow-up with primary care or with orthopedics if pain is still persisting.  She states that she has another appointment with her primary care in another week.  Discussed that blood  clot is likely not the cause, and is likely not a side effect of her hypertensive medications.  Discussed that the side effects are typically more severe and generalized if it is from her medications.  Patient verbalizes understanding and is in agreement with treatment plan. Final Clinical Impressions(s) / UC Diagnoses   Final diagnoses:  Right hip pain     Discharge Instructions     I have sent in an anti-inflammatory to your pharmacy.  You may take these once daily.  I have also sent in a muscle  relaxer to your pharmacy.  You may take 1/2 to 1 tablet at bedtime as needed for muscle spasms.  I would recommend doing the exercises that have attached in this paperwork as well.  You may go to a chiropractor as needed.  But I would give these exercises to try first  If things are not getting better, I would visit orthopedics for further work-up.    ED Prescriptions    Medication Sig Dispense Auth. Provider   nabumetone (RELAFEN) 500 MG tablet Take 1 tablet (500 mg total) by mouth daily. 30 tablet Faustino Congress, NP   tiZANidine (ZANAFLEX) 2 MG tablet Take 1 tablet (2 mg total) by mouth every 6 (six) hours as needed for muscle spasms. 30 tablet Faustino Congress, NP     PDMP not reviewed this encounter.   Faustino Congress, NP 04/21/20 1004

## 2020-04-21 NOTE — Discharge Instructions (Addendum)
I have sent in an anti-inflammatory to your pharmacy.  You may take these once daily.  I have also sent in a muscle relaxer to your pharmacy.  You may take 1/2 to 1 tablet at bedtime as needed for muscle spasms.  I would recommend doing the exercises that have attached in this paperwork as well.  You may go to a chiropractor as needed.  But I would give these exercises to try first  If things are not getting better, I would visit orthopedics for further work-up.

## 2020-04-25 ENCOUNTER — Ambulatory Visit: Payer: Medicare Other | Attending: Internal Medicine

## 2020-04-25 DIAGNOSIS — Z23 Encounter for immunization: Secondary | ICD-10-CM

## 2020-04-25 NOTE — Progress Notes (Signed)
   Covid-19 Vaccination Clinic  Name:  Tina Stone    MRN: [id number] DOB: 1950-02-27  04/25/2020  [name] was observed post Covid-19 immunization for 15 minutes without incident. She was provided with Vaccine Information Sheet and instruction to access the V-Safe system.   [name] was instructed to call 911 with any severe reactions post vaccine: Marland Kitchen Difficulty breathing  . Swelling of face and throat  . A fast heartbeat  . A bad rash all over body  . Dizziness and weakness   Immunizations Administered    Name Date Dose VIS Date Route   Pfizer COVID-19 Vaccine 04/25/2020  8:15 AM 0.3 mL 02/24/2019 Intramuscular   Manufacturer: [place]   Lot: [id number]   [place]: [id number]

## 2020-05-02 ENCOUNTER — Other Ambulatory Visit: Payer: Self-pay

## 2020-05-03 ENCOUNTER — Other Ambulatory Visit: Payer: Self-pay | Admitting: *Deleted

## 2020-05-03 ENCOUNTER — Encounter: Payer: Self-pay | Admitting: Internal Medicine

## 2020-05-03 ENCOUNTER — Other Ambulatory Visit: Payer: Self-pay

## 2020-05-03 ENCOUNTER — Telehealth: Payer: Self-pay | Admitting: *Deleted

## 2020-05-03 ENCOUNTER — Other Ambulatory Visit: Payer: Self-pay | Admitting: Gynecologic Oncology

## 2020-05-03 ENCOUNTER — Inpatient Hospital Stay: Payer: Medicare Other

## 2020-05-03 ENCOUNTER — Other Ambulatory Visit: Payer: Self-pay | Admitting: Internal Medicine

## 2020-05-03 ENCOUNTER — Ambulatory Visit (INDEPENDENT_AMBULATORY_CARE_PROVIDER_SITE_OTHER): Payer: Medicare Other | Admitting: Internal Medicine

## 2020-05-03 VITALS — BP 120/80 | HR 85 | Temp 97.3°F | Ht 63.0 in | Wt 214.1 lb

## 2020-05-03 DIAGNOSIS — Z0000 Encounter for general adult medical examination without abnormal findings: Secondary | ICD-10-CM

## 2020-05-03 DIAGNOSIS — C541 Malignant neoplasm of endometrium: Secondary | ICD-10-CM

## 2020-05-03 DIAGNOSIS — E785 Hyperlipidemia, unspecified: Secondary | ICD-10-CM

## 2020-05-03 DIAGNOSIS — Z6837 Body mass index (BMI) 37.0-37.9, adult: Secondary | ICD-10-CM

## 2020-05-03 DIAGNOSIS — K219 Gastro-esophageal reflux disease without esophagitis: Secondary | ICD-10-CM

## 2020-05-03 DIAGNOSIS — I10 Essential (primary) hypertension: Secondary | ICD-10-CM | POA: Diagnosis not present

## 2020-05-03 DIAGNOSIS — R102 Pelvic and perineal pain: Secondary | ICD-10-CM

## 2020-05-03 LAB — COMPREHENSIVE METABOLIC PANEL
ALT: 9 U/L (ref 0–35)
AST: 36 U/L (ref 0–37)
Albumin: 4 g/dL (ref 3.5–5.2)
Alkaline Phosphatase: 94 U/L (ref 39–117)
BUN: 11 mg/dL (ref 6–23)
CO2: 30 mEq/L (ref 19–32)
Calcium: 9.2 mg/dL (ref 8.4–10.5)
Chloride: 101 mEq/L (ref 96–112)
Creatinine, Ser: 0.88 mg/dL (ref 0.40–1.20)
GFR: 77.47 mL/min (ref >60.00)
Glucose, Bld: 96 mg/dL (ref 70–99)
Potassium: 3.6 mEq/L (ref 3.5–5.1)
Sodium: 138 mEq/L (ref 135–145)
Total Bilirubin: 0.7 mg/dL (ref 0.2–1.2)
Total Protein: 7.1 g/dL (ref 6.0–8.3)

## 2020-05-03 LAB — VITAMIN D 25 HYDROXY (VIT D DEFICIENCY, FRACTURES): VITD: 30.58 ng/mL (ref 30.00–100.00)

## 2020-05-03 LAB — HEMOGLOBIN A1C: Hgb A1c MFr Bld: 5.7 % (ref 4.6–6.5)

## 2020-05-03 LAB — LIPID PANEL
Cholesterol: 254 mg/dL — ABNORMAL HIGH (ref 0–200)
HDL: 63.9 mg/dL (ref >39.00)
LDL Cholesterol: 172 mg/dL — ABNORMAL HIGH (ref 0–99)
NonHDL: 190.39
Total CHOL/HDL Ratio: 4
Triglycerides: 91 mg/dL (ref 0.0–149.0)
VLDL: 18.2 mg/dL (ref 0.0–40.0)

## 2020-05-03 LAB — CBC WITH DIFFERENTIAL/PLATELET
Basophils Absolute: 0 10*3/uL (ref 0.0–0.1)
Basophils Relative: 0.6 % (ref 0.0–3.0)
Eosinophils Absolute: 0 10*3/uL (ref 0.0–0.7)
Eosinophils Relative: 1.1 % (ref 0.0–5.0)
HCT: 37.8 % (ref 36.0–46.0)
Hemoglobin: 12.1 g/dL (ref 12.0–15.0)
Lymphocytes Relative: 33.8 % (ref 12.0–46.0)
Lymphs Abs: 1.5 10*3/uL (ref 0.7–4.0)
MCHC: 31.9 g/dL (ref 30.0–36.0)
MCV: 76.3 fl — ABNORMAL LOW (ref 78.0–100.0)
Monocytes Absolute: 0.3 10*3/uL (ref 0.1–1.0)
Monocytes Relative: 7 % (ref 3.0–12.0)
Neutro Abs: 2.5 10*3/uL (ref 1.4–7.7)
Neutrophils Relative %: 57.5 % (ref 43.0–77.0)
Platelets: 277 10*3/uL (ref 150.0–400.0)
RBC: 4.96 Mil/uL (ref 3.87–5.11)
RDW: 15 % (ref 11.5–15.5)
WBC: 4.3 10*3/uL (ref 4.0–10.5)

## 2020-05-03 LAB — TSH: TSH: 3.36 u[IU]/mL (ref 0.35–4.50)

## 2020-05-03 LAB — VITAMIN B12: Vitamin B-12: 1209 pg/mL — ABNORMAL HIGH (ref 211–911)

## 2020-05-03 MED ORDER — ATORVASTATIN CALCIUM 20 MG PO TABS: 20.0000 mg | ORAL_TABLET | Freq: Every day | ORAL | 1 refills | Status: DC

## 2020-05-03 MED FILL — ATORVASTATIN 20 MG TABLET: 20 | 90 days supply | Qty: 90 | Fill #0

## 2020-05-03 NOTE — Progress Notes (Signed)
Established Patient Office Visit     This visit occurred during the SARS-CoV-2 public health emergency.  Safety protocols were in place, including screening questions prior to the visit, additional usage of staff PPE, and extensive cleaning of exam room while observing appropriate contact time as indicated for disinfecting solutions.    CC/Reason for Visit: Annual preventive exam and subsequent Medicare wellness visit  HPI: Tina Stone is a 70 y.o. female who is coming in today for the above mentioned reasons. Past Medical History is significant for: Hypertension that has been well controlled on chlorthalidone, GERD not on PPI therapy, hyperlipidemia not on statins due to history of intolerance with myalgias, history of endometrial cancer status post hysterectomy in November 2020 followed by Dr. Denman George.  She has been doing well since I last saw her other than some mild right-sided hip pain that she controls very well with Tylenol.  She has received both of her Covid vaccines.  She has routine eye and dental care.   Past Medical/Surgical History: Past Medical History:  Diagnosis Date  . Adenomatous colon polyp   . Allergic rhinitis, seasonal   . Allergy   . Beta thalassemia trait 11/25/2013  . Cancer ([place])    endometrial   . Cholelithiasis   . Class 3 obesity without serious comorbidity with body mass index (BMI) of 40.0 to 44.9 in adult 11/19/2012  . GERD (gastroesophageal reflux disease)   . HLD (hyperlipidemia)   . Hypercholesterolemia   . Hypertension 03/18/2017   no meds   . Intraductal papilloma of left breast    Patient underwent left needle-localized lumpectomy by Dr. Imogene Burn. [place]uei on 09/09/2013; pathology showed intraductal papilloma with no atypia or malignancy identified.  Marland Kitchen PONV (postoperative nausea and vomiting)   . Pre-diabetes    pt denies  . Uterine fibroid     Past Surgical History:  Procedure Laterality Date  . [place]   right  . BREAST EXCISIONAL BIOPSY Left 2014   benign  . BREAST LUMPECTOMY WITH NEEDLE LOCALIZATION Left 09/09/2013   Procedure: BREAST LUMPECTOMY WITH NEEDLE LOCALIZATION;  Surgeon: Imogene Burn. Georgette Dover, MD;  Location: [facility];  Service: General;  Laterality: Left;  . CHOLECYSTECTOMY    . COLONOSCOPY    . POLYPECTOMY    . ROBOTIC ASSISTED LAPAROSCOPIC CHOLECYSTECTOMY  09/09/2019  . ROBOTIC ASSISTED TOTAL HYSTERECTOMY WITH BILATERAL SALPINGO OOPHERECTOMY N/A 10/22/2019   Procedure: XI ROBOTIC ASSISTED TOTAL HYSTERECTOMY WITH BILATERAL SALPINGO OOPHORECTOMY GREATER THAN 250 GRAMS, MINI LAPARTOMY FOR SPECIMEN DELIVERY; PELVIC AND PERI-AORTIC LYMPHADENECTOMY;  Surgeon: Everitt Amber, MD;  Location: [facility];  Service: Gynecology;  Laterality: N/A;  . SENTINEL NODE BIOPSY N/A 10/22/2019   Procedure: SENTINEL NODE BIOPSY;  Surgeon: Everitt Amber, MD;  Location: [facility];  Service: Gynecology;  Laterality: N/A;    Social History:  reports that she has never smoked. She has never used smokeless tobacco. She reports that she does not drink alcohol or use drugs.  Allergies: Allergies  Allergen Reactions  . Benicar Hct [Olmesartan Medoxomil-Hctz] Shortness Of Breath and Palpitations  . Bactrim [Sulfamethoxazole-Trimethoprim] Other (See Comments)    Abdominal pain, dizziness  . Pravastatin Other (See Comments)    Lower abdominal pain  . Shellfish Allergy Nausea And Vomiting  . Amlodipine Palpitations    Family History:  Family History  Problem Relation Age of Onset  . Diabetes Mother   . Hypertension Mother   . Colon polyps Mother 69  . Dementia Mother  87  . Diabetes Father   . Congestive Heart Failure Father   . Pancreatic cancer Paternal Aunt   . Colon cancer Neg Hx   . Breast cancer Neg Hx   . Lung cancer Neg Hx   . Esophageal cancer Neg Hx   . Rectal cancer Neg Hx   . Stomach cancer Neg Hx      Current Outpatient Medications:  .  APPLE CIDER VINEGAR PO, Take 5 mLs by mouth daily. , Disp: ,  Rfl:  .  bisacodyl (DUCODYL) 5 MG EC tablet, Take 5 mg by mouth daily as needed for moderate constipation., Disp: , Rfl:  .  chlorthalidone (HYGROTON) 25 MG tablet, TAKE 0.5 TABLETS (12.5 MG TOTAL) BY MOUTH DAILY., Disp: 30 tablet, Rfl: 1 .  Multiple Vitamin (MULTIVITAMIN) capsule, Take 1 capsule by mouth daily., Disp: , Rfl:  .  Omega-3 Fatty Acids (FISH OIL PO), Take 500 mg by mouth every other day., Disp: , Rfl:  .  OVER THE COUNTER MEDICATION, Bioqueretin daily, Disp: , Rfl:  .  Probiotic Product (PROBIOTIC DAILY PO), Take 1 tablet by mouth every other day., Disp: , Rfl:  .  tiZANidine (ZANAFLEX) 2 MG tablet, Take 1 tablet (2 mg total) by mouth every 6 (six) hours as needed for muscle spasms., Disp: 30 tablet, Rfl: 0 .  nabumetone (RELAFEN) 500 MG tablet, Take 1 tablet (500 mg total) by mouth daily. (Patient not taking: Reported on 05/03/2020), Disp: 30 tablet, Rfl: 0  Review of Systems:  Constitutional: Denies fever, chills, diaphoresis, appetite change and fatigue.  HEENT: Denies photophobia, eye pain, redness, hearing loss, ear pain, congestion, sore throat, rhinorrhea, sneezing, mouth sores, trouble swallowing, neck pain, neck stiffness and tinnitus.   Respiratory: Denies SOB, DOE, cough, chest tightness,  and wheezing.   Cardiovascular: Denies chest pain, palpitations and leg swelling.  Gastrointestinal: Denies nausea, vomiting, abdominal pain, diarrhea, constipation, blood in stool and abdominal distention.  Genitourinary: Denies dysuria, urgency, frequency, hematuria, flank pain and difficulty urinating.  Endocrine: Denies: hot or cold intolerance, sweats, changes in hair or nails, polyuria, polydipsia. Musculoskeletal: Denies myalgias, back pain, joint swelling, arthralgias and gait problem.  Skin: Denies pallor, rash and wound.  Neurological: Denies dizziness, seizures, syncope, weakness, light-headedness, numbness and headaches.  Hematological: Denies adenopathy. Easy bruising,  personal or family bleeding history  Psychiatric/Behavioral: Denies suicidal ideation, mood changes, confusion, nervousness, sleep disturbance and agitation    Physical Exam: Vitals:   05/03/20 1024  BP: 120/80  Pulse: 85  Temp: (!) 97.3 F (36.3 C)  TempSrc: Temporal  SpO2: 98%  Weight: 214 lb 1.6 oz (97.1 kg)  Height: '5\' 3"'  (1.6 m)    Body mass index is 37.93 kg/m.   Constitutional: NAD, calm, comfortable Eyes: PERRL, lids and conjunctivae normal ENMT: Mucous membranes are moist. Tympanic membrane is pearly white, no erythema or bulging. Neck: normal, supple, no masses, no thyromegaly Respiratory: clear to auscultation bilaterally, no wheezing, no crackles. Normal respiratory effort. No accessory muscle use.  Cardiovascular: Regular rate and rhythm, no murmurs / rubs / gallops. No extremity edema. 2+ pedal pulses. No carotid bruits.  Abdomen: no tenderness, no masses palpated. No hepatosplenomegaly. Bowel sounds positive.  Musculoskeletal: no clubbing / cyanosis. No joint deformity upper and lower extremities. Good ROM, no contractures. Normal muscle tone.  Skin: no rashes, lesions, ulcers. No induration Neurologic: CN 2-12 grossly intact. Sensation intact, DTR normal. Strength 5/5 in all 4.  Psychiatric: Normal judgment and insight. Alert and oriented x 3.  Normal mood.   Subsequent Medicare wellness visit   1. Risk factors, based on past  M,S,F -cardiovascular disease risk factors include age, history of hypertension, history of hyperlipidemia   2.  Physical activities: She is very sedentary   3.  Depression/mood:  Stable, not depressed   4.  Hearing:  No perceived issues   5.  ADL's: Independent in all ADLs   6.  Fall risk:  Low fall risk   7.  Home safety: No problems identified   8.  Height weight, and visual acuity: Height and weight as above, visual acuity is 20/20 with each eye independently and together   9.  Counseling:  Increase physical activity,  weight loss discussed   10. Lab orders based on risk factors: Laboratory update will be reviewed   11. Referral :  None today   12. Care plan:  Follow-up with me in 6 months   13. Cognitive assessment:  No cognitive impairment   14. Screening: Patient provided with a written and personalized 5-10 year screening schedule in the AVS.   yes   15. Provider List Update:   PCP only  16. Advance Directives: Full code     Office Visit from 05/03/2020 in [facility]  PHQ-9 Total Score  1      Fall Risk  05/03/2020 03/29/2020 01/29/2019 10/13/2018 09/24/2018  Falls in the past year? 0 0 0 No No  Number falls in past yr: 0 0 - - -  Injury with Fall? 0 0 - - -     Impression and Plan:  Encounter for preventive health examination  -She has routine eye and dental care. -She is due for shingles vaccines, however she will wait 6 weeks after Covid to do, otherwise immunizations are up-to-date. -Screening labs today. -Healthy lifestyle has been discussed in detail. -She has routine follow-ups with GYN due to her history of endometrial cancer. -She had a colonoscopy in 2019 and is a 5-year callback. -Her last mammogram was in July 2020.  Endometrial cancer ([place]) -Follows with GYN and with GYN oncology routinely.  Essential hypertension -Well-controlled on current regimen.  Gastroesophageal reflux disease, unspecified whether esophagitis present -Well-controlled, not on PPI therapy.  Hyperlipidemia, unspecified hyperlipidemia type  - Plan: Lipid panel -Last LDL was 191 in 2018, she has intolerance to statins. -If LDL again remains elevated, will need referral to advanced lipid clinic.  Morbid obesity ([place]) -Discussed healthy lifestyle, including increased physical activity and better food choices to promote weight loss.    Patient Instructions  -Nice seeing you today!!  -Lab work today; will notify you once results are available.  -See you back in 6 months or  sooner as needed.   Preventive Care 87 Years and Older, Female Preventive care refers to lifestyle choices and visits with your health care provider that can promote health and wellness. This includes:  A yearly physical exam. This is also called an annual well check.  Regular dental and eye exams.  Immunizations.  Screening for certain conditions.  Healthy lifestyle choices, such as diet and exercise. What can I expect for my preventive care visit? Physical exam Your health care provider will check:  Height and weight. These may be used to calculate body mass index (BMI), which is a measurement that tells if you are at a healthy weight.  Heart rate and blood pressure.  Your skin for abnormal spots. Counseling Your health care provider may ask you questions about:  Alcohol, tobacco, and  drug use.  Emotional well-being.  Home and relationship well-being.  Sexual activity.  Eating habits.  History of falls.  Memory and ability to understand (cognition).  Work and work Statistician.  Pregnancy and menstrual history. What immunizations do I need?  Influenza (flu) vaccine  This is recommended every year. Tetanus, diphtheria, and pertussis (Tdap) vaccine  You may need a Td booster every 10 years. Varicella (chickenpox) vaccine  You may need this vaccine if you have not already been vaccinated. Zoster (shingles) vaccine  You may need this after age 2. Pneumococcal conjugate (PCV13) vaccine  One dose is recommended after age 21. Pneumococcal polysaccharide (PPSV23) vaccine  One dose is recommended after age 17. Measles, mumps, and rubella (MMR) vaccine  You may need at least one dose of MMR if you were born in 1957 or later. You may also need a second dose. Meningococcal conjugate (MenACWY) vaccine  You may need this if you have certain conditions. Hepatitis A vaccine  You may need this if you have certain conditions or if you travel or work in places  where you may be exposed to hepatitis A. Hepatitis B vaccine  You may need this if you have certain conditions or if you travel or work in places where you may be exposed to hepatitis B. Haemophilus influenzae type b (Hib) vaccine  You may need this if you have certain conditions. You may receive vaccines as individual doses or as more than one vaccine together in one shot (combination vaccines). Talk with your health care provider about the risks and benefits of combination vaccines. What tests do I need? Blood tests  Lipid and cholesterol levels. These may be checked every 5 years, or more frequently depending on your overall health.  Hepatitis C test.  Hepatitis B test. Screening  Lung cancer screening. You may have this screening every year starting at age 31 if you have a 30-pack-year history of smoking and currently smoke or have quit within the past 15 years.  Colorectal cancer screening. All adults should have this screening starting at age 58 and continuing until age 81. Your health care provider may recommend screening at age 81 if you are at increased risk. You will have tests every 1-10 years, depending on your results and the type of screening test.  Diabetes screening. This is done by checking your blood sugar (glucose) after you have not eaten for a while (fasting). You may have this done every 1-3 years.  Mammogram. This may be done every 1-2 years. Talk with your health care provider about how often you should have regular mammograms.  BRCA-related cancer screening. This may be done if you have a family history of breast, ovarian, tubal, or peritoneal cancers. Other tests  Sexually transmitted disease (STD) testing.  Bone density scan. This is done to screen for osteoporosis. You may have this done starting at age 45. Follow these instructions at home: Eating and drinking  Eat a diet that includes fresh fruits and vegetables, whole grains, lean protein, and low-fat  dairy products. Limit your intake of foods with high amounts of sugar, saturated fats, and salt.  Take vitamin and mineral supplements as recommended by your health care provider.  Do not drink alcohol if your health care provider tells you not to drink.  If you drink alcohol: ? Limit how much you have to 0-1 drink a day. ? Be aware of how much alcohol is in your drink. In the [place] one drink equals one 12 oz  bottle of beer (355 mL), one 5 oz glass of wine (148 mL), or one 1 oz glass of hard liquor (44 mL). Lifestyle  Take daily care of your teeth and gums.  Stay active. Exercise for at least 30 minutes on 5 or more days each week.  Do not use any products that contain nicotine or tobacco, such as cigarettes, e-cigarettes, and chewing tobacco. If you need help quitting, ask your health care provider.  If you are sexually active, practice safe sex. Use a condom or other form of protection in order to prevent STIs (sexually transmitted infections).  Talk with your health care provider about taking a low-dose aspirin or statin. What's next?  Go to your health care provider once a year for a well check visit.  Ask your health care provider how often you should have your eyes and teeth checked.  Stay up to date on all vaccines. This information is not intended to replace advice given to you by your health care provider. Make sure you discuss any questions you have with your health care provider. Document Revised: 12/11/2018 Document Reviewed: 12/11/2018 Elsevier Patient Education  2020 [place], MD [facility]

## 2020-05-03 NOTE — Telephone Encounter (Signed)
Tina Stone called this morning reporting persistent cramping for 3 weeks. Heywood Iles NP notified and CT scheduled. I called her back and let her know of the time and date of her CT appt. She is going to come into the cancer center today to get her labs drawn and pick up her contrast.

## 2020-05-03 NOTE — Patient Instructions (Signed)
-Nice seeing you today!!  -Lab work today; will notify you once results are available.  -See you back in 6 months or sooner as needed.   Preventive Care 65 Years and Older, Female Preventive care refers to lifestyle choices and visits with your health care provider that can promote health and wellness. This includes:  A yearly physical exam. This is also called an annual well check.  Regular dental and eye exams.  Immunizations.  Screening for certain conditions.  Healthy lifestyle choices, such as diet and exercise. What can I expect for my preventive care visit? Physical exam Your health care provider will check:  Height and weight. These may be used to calculate body mass index (BMI), which is a measurement that tells if you are at a healthy weight.  Heart rate and blood pressure.  Your skin for abnormal spots. Counseling Your health care provider may ask you questions about:  Alcohol, tobacco, and drug use.  Emotional well-being.  Home and relationship well-being.  Sexual activity.  Eating habits.  History of falls.  Memory and ability to understand (cognition).  Work and work environment.  Pregnancy and menstrual history. What immunizations do I need?  Influenza (flu) vaccine  This is recommended every year. Tetanus, diphtheria, and pertussis (Tdap) vaccine  You may need a Td booster every 10 years. Varicella (chickenpox) vaccine  You may need this vaccine if you have not already been vaccinated. Zoster (shingles) vaccine  You may need this after age 60. Pneumococcal conjugate (PCV13) vaccine  One dose is recommended after age 65. Pneumococcal polysaccharide (PPSV23) vaccine  One dose is recommended after age 65. Measles, mumps, and rubella (MMR) vaccine  You may need at least one dose of MMR if you were born in 1957 or later. You may also need a second dose. Meningococcal conjugate (MenACWY) vaccine  You may need this if you have certain  conditions. Hepatitis A vaccine  You may need this if you have certain conditions or if you travel or work in places where you may be exposed to hepatitis A. Hepatitis B vaccine  You may need this if you have certain conditions or if you travel or work in places where you may be exposed to hepatitis B. Haemophilus influenzae type b (Hib) vaccine  You may need this if you have certain conditions. You may receive vaccines as individual doses or as more than one vaccine together in one shot (combination vaccines). Talk with your health care provider about the risks and benefits of combination vaccines. What tests do I need? Blood tests  Lipid and cholesterol levels. These may be checked every 5 years, or more frequently depending on your overall health.  Hepatitis C test.  Hepatitis B test. Screening  Lung cancer screening. You may have this screening every year starting at age 55 if you have a 30-pack-year history of smoking and currently smoke or have quit within the past 15 years.  Colorectal cancer screening. All adults should have this screening starting at age 50 and continuing until age 75. Your health care provider may recommend screening at age 45 if you are at increased risk. You will have tests every 1-10 years, depending on your results and the type of screening test.  Diabetes screening. This is done by checking your blood sugar (glucose) after you have not eaten for a while (fasting). You may have this done every 1-3 years.  Mammogram. This may be done every 1-2 years. Talk with your health care provider about   how often you should have regular mammograms.  BRCA-related cancer screening. This may be done if you have a family history of breast, ovarian, tubal, or peritoneal cancers. Other tests  Sexually transmitted disease (STD) testing.  Bone density scan. This is done to screen for osteoporosis. You may have this done starting at age 65. Follow these instructions at  home: Eating and drinking  Eat a diet that includes fresh fruits and vegetables, whole grains, lean protein, and low-fat dairy products. Limit your intake of foods with high amounts of sugar, saturated fats, and salt.  Take vitamin and mineral supplements as recommended by your health care provider.  Do not drink alcohol if your health care provider tells you not to drink.  If you drink alcohol: ? Limit how much you have to 0-1 drink a day. ? Be aware of how much alcohol is in your drink. In the [place] one drink equals one 12 oz bottle of beer (355 mL), one 5 oz glass of wine (148 mL), or one 1 oz glass of hard liquor (44 mL). Lifestyle  Take daily care of your teeth and gums.  Stay active. Exercise for at least 30 minutes on 5 or more days each week.  Do not use any products that contain nicotine or tobacco, such as cigarettes, e-cigarettes, and chewing tobacco. If you need help quitting, ask your health care provider.  If you are sexually active, practice safe sex. Use a condom or other form of protection in order to prevent STIs (sexually transmitted infections).  Talk with your health care provider about taking a low-dose aspirin or statin. What's next?  Go to your health care provider once a year for a well check visit.  Ask your health care provider how often you should have your eyes and teeth checked.  Stay up to date on all vaccines. This information is not intended to replace advice given to you by your health care provider. Make sure you discuss any questions you have with your health care provider. Document Revised: 12/11/2018 Document Reviewed: 12/11/2018 Elsevier Patient Education  2020 Elsevier Inc[employer]  

## 2020-05-03 NOTE — Progress Notes (Signed)
See RN note. Patient called with persistent lower abdominal/pelvic cramping/discomfort for the past three weeks. Hx of stage IA grade 3 (serous/clear cell) endometrial cancer. Patient declined recommendation for vaginal brachytherapy and adjuvant chemotherapy to reduce risk for local recurrence. CT ordered to evaluate for recurrence.

## 2020-05-05 ENCOUNTER — Other Ambulatory Visit: Payer: Self-pay | Admitting: Internal Medicine

## 2020-05-05 ENCOUNTER — Other Ambulatory Visit: Payer: Self-pay | Admitting: Family Medicine

## 2020-05-05 ENCOUNTER — Telehealth: Payer: Self-pay | Admitting: Internal Medicine

## 2020-05-05 DIAGNOSIS — Z1231 Encounter for screening mammogram for malignant neoplasm of breast: Secondary | ICD-10-CM

## 2020-05-05 NOTE — Telephone Encounter (Signed)
Patient will try the prescription of Lipitor

## 2020-05-05 NOTE — Telephone Encounter (Signed)
Pt would like a call back to go over medication that was prescribed. She did not provide name of medication.

## 2020-05-09 ENCOUNTER — Other Ambulatory Visit: Payer: Self-pay | Admitting: Family Medicine

## 2020-05-09 NOTE — Telephone Encounter (Signed)
Please see message and advise.  Thank you. ° °

## 2020-05-10 ENCOUNTER — Other Ambulatory Visit: Payer: Self-pay

## 2020-05-10 ENCOUNTER — Other Ambulatory Visit: Payer: Self-pay | Admitting: Internal Medicine

## 2020-05-10 ENCOUNTER — Encounter (HOSPITAL_COMMUNITY): Payer: Self-pay

## 2020-05-10 ENCOUNTER — Ambulatory Visit (HOSPITAL_COMMUNITY)
Admission: RE | Admit: 2020-05-10 | Discharge: 2020-05-10 | Disposition: A | Discharge status: Home / Self Care | Payer: Medicare Other | Source: Ambulatory Visit | Attending: Gynecologic Oncology | Admitting: Gynecologic Oncology

## 2020-05-10 ENCOUNTER — Telehealth: Payer: Self-pay | Admitting: Gynecologic Oncology

## 2020-05-10 DIAGNOSIS — R102 Pelvic and perineal pain: Secondary | ICD-10-CM | POA: Insufficient documentation

## 2020-05-10 DIAGNOSIS — C541 Malignant neoplasm of endometrium: Secondary | ICD-10-CM | POA: Diagnosis not present

## 2020-05-10 MED ORDER — SODIUM CHLORIDE (PF) 0.9 % IJ SOLN
INTRAMUSCULAR | Status: AC
  Filled 2020-05-10: qty 50

## 2020-05-10 MED ORDER — IOHEXOL 300 MG/ML  SOLN
100.0000 mL | Freq: Once | INTRAMUSCULAR | Status: AC | PRN
  Administered 2020-05-10: 100 mL via INTRAVENOUS

## 2020-05-10 NOTE — Telephone Encounter (Signed)
Informed patient of CT scan results. Informed her that the appearance is of recurrent endometrial cancer. Explained that I am recommending salvage chemotherapy.  We will get her in to see Dr Alvy Bimler as soon as possible. She is in agreement with this.  Thereasa Solo, MD

## 2020-05-11 ENCOUNTER — Telehealth: Payer: Self-pay | Admitting: Oncology

## 2020-05-11 NOTE — Telephone Encounter (Signed)
Thanks, will work her in

## 2020-05-11 NOTE — Telephone Encounter (Signed)
Tina Stone and scheduled appointment to see Dr. Alvy Bimler on 05/16/20 at 12 pm followed by a patient education class.  She verbalized understanding and agreement.

## 2020-05-16 ENCOUNTER — Inpatient Hospital Stay: Payer: Medicare Other

## 2020-05-16 ENCOUNTER — Other Ambulatory Visit: Payer: Self-pay

## 2020-05-16 ENCOUNTER — Encounter: Payer: Self-pay | Admitting: Oncology

## 2020-05-16 ENCOUNTER — Inpatient Hospital Stay: Payer: Medicare Other | Attending: Gynecologic Oncology | Admitting: Hematology and Oncology

## 2020-05-16 ENCOUNTER — Encounter: Payer: Self-pay | Admitting: Hematology and Oncology

## 2020-05-16 VITALS — BP 130/69 | HR 72 | Temp 98.3°F | Resp 18 | Ht 63.0 in | Wt 215.2 lb

## 2020-05-16 DIAGNOSIS — Z5111 Encounter for antineoplastic chemotherapy: Secondary | ICD-10-CM | POA: Insufficient documentation

## 2020-05-16 DIAGNOSIS — Z7952 Long term (current) use of systemic steroids: Secondary | ICD-10-CM | POA: Insufficient documentation

## 2020-05-16 DIAGNOSIS — Z7189 Other specified counseling: Secondary | ICD-10-CM | POA: Insufficient documentation

## 2020-05-16 DIAGNOSIS — E669 Obesity, unspecified: Secondary | ICD-10-CM | POA: Insufficient documentation

## 2020-05-16 DIAGNOSIS — Z79899 Other long term (current) drug therapy: Secondary | ICD-10-CM | POA: Diagnosis not present

## 2020-05-16 DIAGNOSIS — Z791 Long term (current) use of non-steroidal anti-inflammatories (NSAID): Secondary | ICD-10-CM | POA: Insufficient documentation

## 2020-05-16 DIAGNOSIS — C541 Malignant neoplasm of endometrium: Secondary | ICD-10-CM | POA: Insufficient documentation

## 2020-05-16 MED ORDER — DEXAMETHASONE 4 MG PO TABS: ORAL_TABLET | ORAL | 6 refills | Status: DC

## 2020-05-16 MED ORDER — ONDANSETRON HCL 8 MG PO TABS: 8.0000 mg | ORAL_TABLET | Freq: Three times a day (TID) | ORAL | 1 refills | Status: DC | PRN

## 2020-05-16 MED ORDER — PROCHLORPERAZINE MALEATE 10 MG PO TABS: 10.0000 mg | ORAL_TABLET | Freq: Four times a day (QID) | ORAL | 1 refills | Status: DC | PRN

## 2020-05-16 MED ORDER — LIDOCAINE-PRILOCAINE 2.5-2.5 % EX CREA: TOPICAL_CREAM | CUTANEOUS | 3 refills | Status: DC

## 2020-05-16 MED FILL — PROCHLORPERAZINE 10 MG TAB: 10 | 7 days supply | Qty: 30 | Fill #0

## 2020-05-16 MED FILL — DEXAMETHASONE 4 MG TABLET: 4 | 21 days supply | Qty: 4 | Fill #0

## 2020-05-16 MED FILL — ONDANSETRON HCL 8 MG TABLET: 8 | 10 days supply | Qty: 30 | Fill #0

## 2020-05-16 MED FILL — LIDOCAINE-PRILOCAINE CREAM: 2.5-2.5 | 10 days supply | Qty: 30 | Fill #0

## 2020-05-16 NOTE — Assessment & Plan Note (Signed)
We discussed goals of care She is widowed She does not have biological children She lives with her sister, Sonia Baller but Mardene Celeste is closer to her I recommend she appoints M[facility]ene Celeste as her healthcare power of attorney We discussed CODE STATUS and she would like to be full code

## 2020-05-16 NOTE — Assessment & Plan Note (Signed)
Unfortunately, CT imaging show signs of disease recurrence She is mildly symptomatic with occasional abdominal pain that comes and goes She is interested to pursue treatment now.  Unfortunately, the goal of treatment is considered palliative  We reviewed the NCCN guidelines We discussed the role of chemotherapy. The intent is of palliative intent.  We discussed some of the risks, benefits, side-effects of carboplatin & Taxol. Treatment is intravenous, every 3 weeks x 6 cycles  Some of the short term side-effects included, though not limited to, including weight loss, life threatening infections, risk of allergic reactions, need for transfusions of blood products, nausea, vomiting, change in bowel habits, loss of hair, admission to hospital for various reasons, and risks of death.   Long term side-effects are also discussed including risks of infertility, permanent damage to nerve function, hearing loss, chronic fatigue, kidney damage with possibility needing hemodialysis, and rare secondary malignancy including bone marrow disorders.  The patient is aware that the response rates discussed earlier is not guaranteed.  After a long discussion, patient made an informed decision to proceed with the prescribed plan of care.   Patient education material was dispensed. We discussed premedication with dexamethasone before chemotherapy. I recommend chemo education class and port placement I do not plan prophylactic G-CSF support Due to class II obesity, I plan to use adjusted body surface area for chemo dose calculation I plan to see her prior to second week of treatment for toxicity review I will order CT imaging after cycle 3 of therapy for objective assessment of response to treatment

## 2020-05-16 NOTE — Progress Notes (Signed)
START ON PATHWAY REGIMEN - Uterine     A cycle is every 21 days:     Paclitaxel      Carboplatin   **Always confirm dose/schedule in your pharmacy ordering system**  Patient Characteristics: Serous Carcinoma, Recurrent/Progressive Disease, Second Line, Relapse ? 6 Months From Prior Therapy, HER2 Negative/Unknown Histology: Serous Carcinoma Therapeutic Status: Recurrent or Progressive Disease Line of Therapy: Second Line Time to Recurrence: Relapse ? 6 Months From Prior Therapy HER2 Status: Negative Intent of Therapy: Non-Curative / Palliative Intent, Discussed with Patient

## 2020-05-16 NOTE — Progress Notes (Signed)
Unk Pinto of upcoming appointment for port placement on 05/18/2020 with arrival at 1 pm.  Also gave her a printed calender of her upcoming appointments.  She verbalized understanding and agreement.

## 2020-05-16 NOTE — Progress Notes (Signed)
[place] OFFICE PROGRESS NOTE  Patient Care Team: Isaac Bliss, Rayford Halsted, MD as PCP - General (Internal Medicine) Axel Filler, MD  ASSESSMENT & PLAN:  Endometrial cancer [facility]) Unfortunately, CT imaging show signs of disease recurrence She is mildly symptomatic with occasional abdominal pain that comes and goes She is interested to pursue treatment now.  Unfortunately, the goal of treatment is considered palliative  We reviewed the NCCN guidelines We discussed the role of chemotherapy. The intent is of palliative intent.  We discussed some of the risks, benefits, side-effects of carboplatin & Taxol. Treatment is intravenous, every 3 weeks x 6 cycles  Some of the short term side-effects included, though not limited to, including weight loss, life threatening infections, risk of allergic reactions, need for transfusions of blood products, nausea, vomiting, change in bowel habits, loss of hair, admission to hospital for various reasons, and risks of death.   Long term side-effects are also discussed including risks of infertility, permanent damage to nerve function, hearing loss, chronic fatigue, kidney damage with possibility needing hemodialysis, and rare secondary malignancy including bone marrow disorders.  The patient is aware that the response rates discussed earlier is not guaranteed.  After a long discussion, patient made an informed decision to proceed with the prescribed plan of care.   Patient education material was dispensed. We discussed premedication with dexamethasone before chemotherapy. I recommend chemo education class and port placement I do not plan prophylactic G-CSF support Due to class II obesity, I plan to use adjusted body surface area for chemo dose calculation I plan to see her prior to second week of treatment for toxicity review I will order CT imaging after cycle 3 of therapy for objective assessment of response to  treatment   Class II obesity I plan to use adjusted BSA for chemo dose calculation, to minimize risk of toxicity  Goals of care, counseling/discussion We discussed goals of care She is widowed She does not have biological children She lives with her sister, Sonia Baller but Mardene Celeste is closer to her I recommend she appoints Mardene Celeste as her healthcare power of attorney We discussed CODE STATUS and she would like to be full code   Orders Placed This Encounter  Procedures  . IR IMAGING GUIDED PORT INSERTION    Standing Status:   Future    Standing Expiration Date:   07/16/2021    Order Specific Question:   Reason for Exam (SYMPTOM  OR DIAGNOSIS REQUIRED)    Answer:   need port for chemo to start 5/24    Order Specific Question:   Preferred Imaging Location?    Answer:   [facility]  . CBC with Differential ([place] Only)    Standing Status:   Standing    Number of Occurrences:   20    Standing Expiration Date:   05/16/2021  . CMP ([place] only)    Standing Status:   Standing    Number of Occurrences:   20    Standing Expiration Date:   05/16/2021    All questions were answered. The patient knows to call the clinic with any problems, questions or concerns. The total time spent in the appointment was 60 minutes encounter with patients including review of chart and various tests results, discussions about plan of care and coordination of care plan   [name], MD 05/16/2020 1:03 PM  INTERVAL HISTORY: Please see below for problem oriented charting. She is seen back for chemotherapy discussion due  to recent CT finding When I saw her last year, the patient has declined adjuvant treatment She started to have intermittent abdominal discomfort with occasional vaginal discharge She had CT imaging done recently which show evidence of cancer recurrence and she is now in agreement to pursue chemotherapy She is still working She denies nausea She has occasional bloating She  had background constipation but takes prune juice on a regular basis for regular bowel movement  SUMMARY OF ONCOLOGIC HISTORY: Oncology History Overview Note  Poorly differentiated carcinoma, mixed histology with squamous differentiation, rare focus of clear cells as well as serous features MSI stable Her2 negative   Endometrial cancer ([place])  09/01/2019 Initial Diagnosis   The patient reported a history of postmenopausal bleeding that began 1 to 2 months before diagnosis   09/14/2019 Imaging   US pelvis 1. Enlarged uterus with numerous myometrial masses, presumably fibroids. 2. Endometrial thickness of 6.2 mm. In the setting of post-menopausal bleeding, endometrial sampling is indicated to exclude carcinoma. If results are benign, sonohysterogram should be considered for focal lesion work-up.  3. Nonvisualized ovaries   09/23/2019 Pathology Results   A. ENDOMETRIUM, BIOPSY:  - Poorly differentiated carcinoma   10/15/2019 Imaging   Ct scan of chest, abdomen and pelvis: No evidence of metastatic disease within the chest, abdomen, or pelvis.   Enlarged fibroid uterus.   Colonic diverticulosis. No radiographic evidence of diverticulitis.   Aortic and coronary artery atherosclerosis.     10/22/2019 Pathology Results   SURGICAL PATHOLOGY   FINAL MICROSCOPIC DIAGNOSIS:   A. UTERUS, BILATERAL TUBES AND OVARIES, HYSTERECTOMY:  Poorly differentiated carcinoma, 6.5 cm.  Lymphovascular involvement by tumor.  Carcinoma involves inner half of the myometrium.  Margins not involved.  Cervix, bilateral fallopian tubes and bilateral ovaries free of tumor.   B. LYMPH NODE, RIGHT EXTERNAL SENTINEL, BIOPSY:  One lymph node with no metastatic carcinoma (0/1).   C. LYMPH NODE, RIGHT PELVIC, BIOPSY  Five lymph nodes with no metastatic carcinoma (0/5).   D. LYMPH NODE, LEFT PELVIC, BIOPSY:  Five lymph nodes with no metastatic carcinoma (0/5).   E. LYMPH NODE, RIGHT PERI AORTIC, BIOPSY:  One  lymph node with no metastatic carcinoma (0/1).   F. LYMPH NODE, LEFT PERI AORTIC, BIOPSY:  Five lymph nodes with no metastatic carcinoma (0/5).    ONCOLOGY TABLE:  UTERUS, CARCINOMA OR CARCINOSARCOMA   Procedure: Total hysterectomy with bilateral f-oophorectomy and sentinel  lymph nodes.  Histologic type: Poorly differentiated carcinoma, see comment.  Histologic Grade: High-grade, FIGO 3.  Myometrial invasion:       Depth of invasion: 13 mm       Myometrial thickness: 40 mm  Uterine Serosa Involvement: Not identified  Cervical stromal involvement: Not identified  Extent of involvement of other organs: Not identified  Lymphovascular invasion: Present  Regional Lymph Nodes:       Examined:     17 Sentinel                               0 non-sentinel                               17 total        Lymph nodes with metastasis: 0        Isolated tumor cells (<0.2 mm): 0        Micrometastasis:  (>0.2 mm and <  2.0 mm): 0        Macrometastasis: (>2.0 mm): 0  Representative Tumor Block: A5, A6, A7 and A8.  MMR / MSI testing: Pending  Pathologic Stage Classification (pTNM, AJCC 8th edition):  pT1a, pN0  Comments: The carcinoma is a high-grade poorly differentiated carcinoma which morphologically has predominantly serous features.  There are a few foci with squamous differentiation and a rare focus of clear cell features.  Immunohistochemistry for cytokeratin AE1/AE3 is performed on the sentinel lymph nodes and no positivity is identified.    10/22/2019 Surgery   Pre-operative Diagnosis: endometrial cancer grade 3   Post-operative Diagnosis: same,    Operation: Robotic-assisted laparoscopic total hysterectomy for uterus >250gm with bilateral salpingoophorectomy, SLN mapping, bilateral pelvic and para-aortic lymphadenectomy.   Surgeon: Donaciano Eva    Operative Findings:  : 16cm bulky fibroid uterus, normal ovaries bilaterally, no suspicious lymph nodes.     11/10/2019 Cancer  Staging   Staging form: Corpus Uteri - Carcinoma and Carcinosarcoma, AJCC 8th Edition - Pathologic: Stage IVB (pT1a, pN0, cM1) - Signed by [name], MD on 05/16/2020   05/10/2020 Imaging   1. New hypodense 2.0 cm segment 4A left liver lobe mass, suspicious for hepatic metastasis. 2. New left pelvic sidewall 1.4 cm soft tissue nodule, suspicious for left internal iliac nodal metastasis. 3. New left vaginal cuff 1.6 x 1.3 cm soft tissue nodule, suspicious for recurrent tumor. 4. Aortic Atherosclerosis (ICD10-I70.0).   05/23/2020 -  Chemotherapy   The patient had palonosetron (ALOXI) injection 0.25 mg, 0.25 mg, Intravenous,  Once, 0 of 6 cycles CARBOplatin (PARAPLATIN) 510 mg in sodium chloride 0.9 % 250 mL chemo infusion, 510 mg (100 % of original dose 514.8 mg), Intravenous,  Once, 0 of 6 cycles Dose modification:   (original dose 514.8 mg, Cycle 1) fosaprepitant (EMEND) 150 mg in sodium chloride 0.9 % 145 mL IVPB, 150 mg, Intravenous,  Once, 0 of 6 cycles PACLitaxel (TAXOL) 312 mg in sodium chloride 0.9 % 500 mL chemo infusion (> 5m/m2), 175 mg/m2 = 312 mg, Intravenous,  Once, 0 of 6 cycles  for chemotherapy treatment.      REVIEW OF SYSTEMS:   Constitutional: Denies fevers, chills or abnormal weight loss Eyes: Denies blurriness of vision Ears, nose, mouth, throat, and face: Denies mucositis or sore throat Respiratory: Denies cough, dyspnea or wheezes Cardiovascular: Denies palpitation, chest discomfort or lower extremity swelling Gastrointestinal:  Denies nausea, heartburn or change in bowel habits Skin: Denies abnormal skin rashes Lymphatics: Denies new lymphadenopathy or easy bruising Neurological:Denies numbness, tingling or new weaknesses Behavioral/Psych: Mood is stable, no new changes  All other systems were reviewed with the patient and are negative.  I have reviewed the past medical history, past surgical history, social history and family history with the patient and they  are unchanged from previous note.  ALLERGIES:  is allergic to benicar hct [olmesartan medoxomil-hctz]; bactrim [sulfamethoxazole-trimethoprim]; pravastatin; shellfish allergy; amlodipine; and iodinated diagnostic agents.  MEDICATIONS:  Current Outpatient Medications  Medication Sig Dispense Refill  . APPLE CIDER VINEGAR PO Take 5 mLs by mouth daily.     .Marland Kitchenatorvastatin (LIPITOR) 20 MG tablet Take 1 tablet (20 mg total) by mouth daily. 90 tablet 1  . bisacodyl (DUCODYL) 5 MG EC tablet Take 5 mg by mouth daily as needed for moderate constipation.    . chlorthalidone (HYGROTON) 25 MG tablet TAKE 1/2 TABLET BY MOUTH DAILY 30 tablet 1  . dexamethasone (DECADRON) 4 MG tablet Take 2 tabs at the  night before and 2 tab the morning of chemotherapy, every 3 weeks, by mouth 20 tablet 6  . lidocaine-prilocaine (EMLA) cream Apply to affected area once 30 g 3  . Multiple Vitamin (MULTIVITAMIN) capsule Take 1 capsule by mouth daily.    . nabumetone (RELAFEN) 500 MG tablet Take 1 tablet (500 mg total) by mouth daily. (Patient not taking: Reported on 05/03/2020) 30 tablet 0  . Omega-3 Fatty Acids (FISH OIL PO) Take 500 mg by mouth every other day.    . ondansetron (ZOFRAN) 8 MG tablet Take 1 tablet (8 mg total) by mouth every 8 (eight) hours as needed. Start on the third day after chemotherapy. 30 tablet 1  . OVER THE COUNTER MEDICATION Bioqueretin daily    . Probiotic Product (PROBIOTIC DAILY PO) Take 1 tablet by mouth every other day.    . prochlorperazine (COMPAZINE) 10 MG tablet Take 1 tablet (10 mg total) by mouth every 6 (six) hours as needed (Nausea or vomiting). 30 tablet 1  . tiZANidine (ZANAFLEX) 2 MG tablet Take 1 tablet (2 mg total) by mouth every 6 (six) hours as needed for muscle spasms. 30 tablet 0   No current facility-administered medications for this visit.    PHYSICAL EXAMINATION: ECOG PERFORMANCE STATUS: 1 - Symptomatic but completely ambulatory  Vitals:   05/16/20 1159  BP: 130/69   Pulse: 72  Resp: 18  Temp: 98.3 F (36.8 C)  SpO2: 100%   Filed Weights   05/16/20 1159  Weight: 215 lb 3.2 oz (97.6 kg)    GENERAL:alert, no distress and comfortable SKIN: skin color, texture, turgor are normal, no rashes or significant lesions EYES: normal, Conjunctiva are pink and non-injected, sclera clear OROPHARYNX:no exudate, no erythema and lips, buccal mucosa, and tongue normal  NECK: supple, thyroid normal size, non-tender, without nodularity LYMPH:  no palpable lymphadenopathy in the cervical, axillary or inguinal LUNGS: clear to auscultation and percussion with normal breathing effort HEART: regular rate & rhythm and no murmurs and no lower extremity edema ABDOMEN:abdomen soft, non-tender and normal bowel sounds Musculoskeletal:no cyanosis of digits and no clubbing  NEURO: alert & oriented x 3 with fluent speech, no focal motor/sensory deficits  LABORATORY DATA:  I have reviewed the data as listed    Component Value Date/Time   NA 138 05/03/2020 1050   NA 140 07/04/2017 0952   K 3.6 05/03/2020 1050   CL 101 05/03/2020 1050   CO2 30 05/03/2020 1050   GLUCOSE 96 05/03/2020 1050   BUN 11 05/03/2020 1050   BUN 10 07/04/2017 0952   CREATININE 0.88 05/03/2020 1050   CREATININE 0.81 03/23/2015 0946   CALCIUM 9.2 05/03/2020 1050   PROT 7.1 05/03/2020 1050   ALBUMIN 4.0 05/03/2020 1050   AST 36 05/03/2020 1050   ALT 9 05/03/2020 1050   ALKPHOS 94 05/03/2020 1050   BILITOT 0.7 05/03/2020 1050   GFRNONAA >60 10/23/2019 0314   GFRNONAA 78 03/23/2015 0946   GFRAA >60 10/23/2019 0314   GFRAA >89 03/23/2015 0946    No results found for: SPEP, UPEP  Lab Results  Component Value Date   WBC 4.3 05/03/2020   NEUTROABS 2.5 05/03/2020   HGB 12.1 05/03/2020   HCT 37.8 05/03/2020   MCV 76.3 (L) 05/03/2020   PLT 277.0 05/03/2020      Chemistry      Component Value Date/Time   NA 138 05/03/2020 1050   NA 140 07/04/2017 0952   K 3.6 05/03/2020 1050   CL 101  05/03/2020 1050   CO2 30 05/03/2020 1050   BUN 11 05/03/2020 1050   BUN 10 07/04/2017 0952   CREATININE 0.88 05/03/2020 1050   CREATININE 0.81 03/23/2015 0946      Component Value Date/Time   CALCIUM 9.2 05/03/2020 1050   ALKPHOS 94 05/03/2020 1050   AST 36 05/03/2020 1050   ALT 9 05/03/2020 1050   BILITOT 0.7 05/03/2020 1050       RADIOGRAPHIC STUDIES: I have reviewed CT imaging with the patient I have personally reviewed the radiological images as listed and agreed with the findings in the report. CT Abdomen Pelvis W Contrast  Result Date: 05/10/2020 CLINICAL DATA:  Stage IA endometrial cancer status post [facility] 10/22/2019. Patient declined recommended adjuvant therapy. Persistent new lower abdominal/pelvic cramping and pain. Restaging. EXAM: CT ABDOMEN AND PELVIS WITH CONTRAST TECHNIQUE: Multidetector CT imaging of the abdomen and pelvis was performed using the standard protocol following bolus administration of intravenous contrast. CONTRAST:  143m OMNIPAQUE IOHEXOL 300 MG/ML  SOLN COMPARISON:  10/15/2019 CT chest, abdomen and pelvis. FINDINGS: Lower chest: No significant pulmonary nodules or acute consolidative airspace disease. Hepatobiliary: Normal liver size. There is a new hypodense 2.0 x 1.6 cm segment 4A left liver lobe mass (series 2/image 22). A few scattered small simple liver cysts are unchanged, largest 1.3 cm in the peripheral right liver. Several low-attenuation subcentimeter lesions scattered in the liver are too small to characterize and not definitely changed. Cholecystectomy. Bile ducts are stable and within normal post cholecystectomy limits with CBD diameter 4 mm. Pancreas: Normal, with no mass or duct dilation. Spleen: Normal size. No mass. Adrenals/Urinary Tract: Normal adrenals. Low-attenuation subcentimeter interpolar right and lower left renal lesions are too small to characterize and unchanged. Otherwise normal kidneys with no hydronephrosis. Normal bladder.  Stomach/Bowel: Normal non-distended stomach. Normal caliber small bowel with no small bowel wall thickening. Normal appendix. Oral contrast transits to the left colon. Moderate sigmoid diverticulosis, with no large bowel wall thickening or significant pericolonic fat stranding. Vascular/Lymphatic: Atherosclerotic nonaneurysmal abdominal aorta. Patent portal, splenic, hepatic and renal veins. Left pelvic sidewall 1.4 x 1.2 cm soft tissue nodule (series 2/image 74), not definitely seen on prior CT, suspicious for pathologic left internal iliac node. No additional pathologically enlarged lymph nodes in the abdomen or pelvis. Reproductive: Interval hysterectomy. Left vaginal cuff 1.6 x 1.3 cm soft tissue nodule (series 2/image 78). No adnexal masses. Other: No pneumoperitoneum, ascites or focal fluid collection. Musculoskeletal: No aggressive appearing focal osseous lesions. Moderate thoracolumbar spondylosis. IMPRESSION: 1. New hypodense 2.0 cm segment 4A left liver lobe mass, suspicious for hepatic metastasis. 2. New left pelvic sidewall 1.4 cm soft tissue nodule, suspicious for left internal iliac nodal metastasis. 3. New left vaginal cuff 1.6 x 1.3 cm soft tissue nodule, suspicious for recurrent tumor. 4. Aortic Atherosclerosis (ICD10-I70.0). Electronically Signed   By: JIlona SorrelM.D.   On: 05/10/2020 13:48

## 2020-05-16 NOTE — Assessment & Plan Note (Signed)
I plan to use adjusted BSA for chemo dose calculation, to minimize risk of toxicity

## 2020-05-17 ENCOUNTER — Other Ambulatory Visit: Payer: Self-pay | Admitting: Radiology

## 2020-05-17 NOTE — Progress Notes (Signed)
Pharmacist Chemotherapy Monitoring - Initial Assessment    Anticipated start date: 05/23/20  Regimen:  . Are orders appropriate based on the patient's diagnosis, regimen, and cycle? Yes . Does the plan date match the patient's scheduled date? Yes . Is the sequencing of drugs appropriate? Yes . Are the premedications appropriate for the patient's regimen? Yes . Prior Authorization for treatment is: Not Started o If applicable, is the correct biosimilar selected based on the patient's insurance? not applicable  Organ Function and Labs: Marland Kitchen Are dose adjustments needed based on the patient's renal function, hepatic function, or hematologic function? Yes . Are appropriate labs ordered prior to the start of patient's treatment? Yes . Other organ system assessment, if indicated: N/A . The following baseline labs, if indicated, have been ordered: N/A  Dose Assessment: . Are the drug doses appropriate? Yes . Are the following correct: o Drug concentrations Yes o IV fluid compatible with drug Yes o Administration routes Yes o Timing of therapy Yes . If applicable, does the patient have documented access for treatment and/or plans for port-a-cath placement? yes . If applicable, have lifetime cumulative doses been properly documented and assessed? not applicable  Toxicity Monitoring/Prevention: . The patient has the following take home antiemetics prescribed: Ondansetron, Compazine . The patient has the following take home medications prescribed: N/A . Medication allergies and previous infusion related reactions, if applicable, have been reviewed and addressed. Yes . The patient's current medication list has been assessed for drug-drug interactions with their chemotherapy regimen. no significant drug-drug interactions were identified on review.  Order Review: . Are the treatment plan orders signed? Yes . Is the patient scheduled to see a provider prior to their treatment? No  I verify that I  have reviewed each item in the above checklist and answered each question accordingly.   Kennith Center, Pharm.D., CPP 05/17/2020@3 :40 PM

## 2020-05-18 ENCOUNTER — Telehealth: Payer: Self-pay | Admitting: Oncology

## 2020-05-18 ENCOUNTER — Ambulatory Visit (HOSPITAL_COMMUNITY)
Admission: RE | Admit: 2020-05-18 | Discharge: 2020-05-18 | Disposition: A | Discharge status: Home / Self Care | Payer: Medicare Other | Source: Ambulatory Visit | Attending: Hematology and Oncology | Admitting: Hematology and Oncology

## 2020-05-18 ENCOUNTER — Other Ambulatory Visit: Payer: Self-pay

## 2020-05-18 ENCOUNTER — Other Ambulatory Visit: Payer: Self-pay | Admitting: Radiology

## 2020-05-18 ENCOUNTER — Encounter (HOSPITAL_COMMUNITY): Payer: Self-pay

## 2020-05-18 DIAGNOSIS — Z881 Allergy status to other antibiotic agents status: Secondary | ICD-10-CM | POA: Diagnosis not present

## 2020-05-18 DIAGNOSIS — Z8249 Family history of ischemic heart disease and other diseases of the circulatory system: Secondary | ICD-10-CM | POA: Diagnosis not present

## 2020-05-18 DIAGNOSIS — E7800 Pure hypercholesterolemia, unspecified: Secondary | ICD-10-CM | POA: Diagnosis not present

## 2020-05-18 DIAGNOSIS — I10 Essential (primary) hypertension: Secondary | ICD-10-CM | POA: Diagnosis not present

## 2020-05-18 DIAGNOSIS — R7303 Prediabetes: Secondary | ICD-10-CM | POA: Diagnosis not present

## 2020-05-18 DIAGNOSIS — Z833 Family history of diabetes mellitus: Secondary | ICD-10-CM | POA: Diagnosis not present

## 2020-05-18 DIAGNOSIS — Z888 Allergy status to other drugs, medicaments and biological substances status: Secondary | ICD-10-CM | POA: Insufficient documentation

## 2020-05-18 DIAGNOSIS — Z5111 Encounter for antineoplastic chemotherapy: Secondary | ICD-10-CM | POA: Diagnosis not present

## 2020-05-18 DIAGNOSIS — Z79899 Other long term (current) drug therapy: Secondary | ICD-10-CM | POA: Insufficient documentation

## 2020-05-18 DIAGNOSIS — C541 Malignant neoplasm of endometrium: Secondary | ICD-10-CM | POA: Insufficient documentation

## 2020-05-18 DIAGNOSIS — E669 Obesity, unspecified: Secondary | ICD-10-CM | POA: Insufficient documentation

## 2020-05-18 DIAGNOSIS — Z809 Family history of malignant neoplasm, unspecified: Secondary | ICD-10-CM | POA: Diagnosis not present

## 2020-05-18 DIAGNOSIS — K219 Gastro-esophageal reflux disease without esophagitis: Secondary | ICD-10-CM | POA: Insufficient documentation

## 2020-05-18 HISTORY — PX: IR IMAGING GUIDED PORT INSERTION: IMG5740

## 2020-05-18 LAB — COMPREHENSIVE METABOLIC PANEL
ALT: 13 U/L (ref 0–44)
AST: 50 U/L — ABNORMAL HIGH (ref 15–41)
Albumin: 3.9 g/dL (ref 3.5–5.0)
Alkaline Phosphatase: 85 U/L (ref 38–126)
Anion gap: 11 (ref 5–15)
BUN: 8 mg/dL (ref 8–23)
CO2: 28 mmol/L (ref 22–32)
Calcium: 9.3 mg/dL (ref 8.9–10.3)
Chloride: 100 mmol/L (ref 98–111)
Creatinine, Ser: 0.98 mg/dL (ref 0.44–1.00)
GFR calc Af Amer: >60 mL/min (ref >60)
GFR calc non Af Amer: 60 mL/min — ABNORMAL LOW (ref >60)
Glucose, Bld: 101 mg/dL — ABNORMAL HIGH (ref 70–99)
Potassium: 3.4 mmol/L — ABNORMAL LOW (ref 3.5–5.1)
Sodium: 139 mmol/L (ref 135–145)
Total Bilirubin: 0.6 mg/dL (ref 0.3–1.2)
Total Protein: 7.3 g/dL (ref 6.5–8.1)

## 2020-05-18 LAB — CBC WITH DIFFERENTIAL/PLATELET
Abs Immature Granulocytes: 0.02 10*3/uL (ref 0.00–0.07)
Basophils Absolute: 0 10*3/uL (ref 0.0–0.1)
Basophils Relative: 1 %
Eosinophils Absolute: 0.1 10*3/uL (ref 0.0–0.5)
Eosinophils Relative: 1 %
HCT: 39.7 % (ref 36.0–46.0)
Hemoglobin: 12.5 g/dL (ref 12.0–15.0)
Immature Granulocytes: 1 %
Lymphocytes Relative: 35 %
Lymphs Abs: 1.6 10*3/uL (ref 0.7–4.0)
MCH: 24.6 pg — ABNORMAL LOW (ref 26.0–34.0)
MCHC: 31.5 g/dL (ref 30.0–36.0)
MCV: 78.1 fL — ABNORMAL LOW (ref 80.0–100.0)
Monocytes Absolute: 0.3 10*3/uL (ref 0.1–1.0)
Monocytes Relative: 7 %
Neutro Abs: 2.5 10*3/uL (ref 1.7–7.7)
Neutrophils Relative %: 55 %
Platelets: 259 10*3/uL (ref 150–400)
RBC: 5.08 MIL/uL (ref 3.87–5.11)
RDW: 15.6 % — ABNORMAL HIGH (ref 11.5–15.5)
WBC: 4.4 10*3/uL (ref 4.0–10.5)
nRBC: 0 % (ref 0.0–0.2)

## 2020-05-18 LAB — PROTIME-INR
INR: 1.1 (ref 0.8–1.2)
Prothrombin Time: 13.3 seconds (ref 11.4–15.2)

## 2020-05-18 MED ORDER — LIDOCAINE HCL (PF) 1 % IJ SOLN
INTRAMUSCULAR | Status: AC | PRN
  Administered 2020-05-18: 5 mL

## 2020-05-18 MED ORDER — HEPARIN SOD (PORK) LOCK FLUSH 100 UNIT/ML IV SOLN
INTRAVENOUS | Status: AC
  Filled 2020-05-18: qty 5

## 2020-05-18 MED ORDER — MIDAZOLAM HCL 2 MG/2ML IJ SOLN
INTRAMUSCULAR | Status: AC
  Filled 2020-05-18: qty 4

## 2020-05-18 MED ORDER — CEFAZOLIN SODIUM-DEXTROSE 2-4 GM/100ML-% IV SOLN
INTRAVENOUS | Status: AC
  Filled 2020-05-18: qty 100

## 2020-05-18 MED ORDER — HEPARIN SOD (PORK) LOCK FLUSH 100 UNIT/ML IV SOLN
INTRAVENOUS | Status: AC | PRN
  Administered 2020-05-18: 500 [IU] via INTRAVENOUS

## 2020-05-18 MED ORDER — FENTANYL CITRATE (PF) 100 MCG/2ML IJ SOLN
INTRAMUSCULAR | Status: AC | PRN
  Administered 2020-05-18 (×2): 50 ug via INTRAVENOUS

## 2020-05-18 MED ORDER — LIDOCAINE-EPINEPHRINE 1 %-1:100000 IJ SOLN
INTRAMUSCULAR | Status: AC
  Filled 2020-05-18: qty 1

## 2020-05-18 MED ORDER — SODIUM CHLORIDE 0.9 % IV SOLN: INTRAVENOUS | Status: DC

## 2020-05-18 MED ORDER — LIDOCAINE-EPINEPHRINE (PF) 1 %-1:200000 IJ SOLN
INTRAMUSCULAR | Status: AC | PRN
  Administered 2020-05-18: 10 mL

## 2020-05-18 MED ORDER — CEFAZOLIN SODIUM-DEXTROSE 2-4 GM/100ML-% IV SOLN
2.0000 g | INTRAVENOUS | Status: AC
  Administered 2020-05-18: 2 g via INTRAVENOUS

## 2020-05-18 MED ORDER — FENTANYL CITRATE (PF) 100 MCG/2ML IJ SOLN
INTRAMUSCULAR | Status: AC
  Filled 2020-05-18: qty 2

## 2020-05-18 MED ORDER — MIDAZOLAM HCL 2 MG/2ML IJ SOLN
INTRAMUSCULAR | Status: AC | PRN
  Administered 2020-05-18 (×2): 1 mg via INTRAVENOUS

## 2020-05-18 NOTE — Consult Note (Signed)
Chief Complaint: "I'm here for a port a cath"  Referring Physician(s): Gorsuch,Ni  Supervising Physician: Sandi Mariscal  Patient Status: [facility] - Out-pt  History of Present Illness: Tina Stone is a 70 y.o. female with history of recurrent endometrial carcinoma, initially diagnosed in September 2020, status post surgery but pt declined chemotherapy at the time. She now presents for port a cath placement for chemotherapy.  Past Medical History:  Diagnosis Date  . Adenomatous colon polyp   . Allergic rhinitis, seasonal   . Allergy   . Beta thalassemia trait 11/25/2013  . Cholelithiasis   . Class 3 obesity without serious comorbidity with body mass index (BMI) of 40.0 to 44.9 in adult 11/19/2012  . endometrial ca dx'd 08/2009   endometrial   . GERD (gastroesophageal reflux disease)   . HLD (hyperlipidemia)   . Hypercholesterolemia   . Hypertension 03/18/2017   no meds   . Intraductal papilloma of left breast    Patient underwent left needle-localized lumpectomy by Dr. Imogene Burn. Tsuei on 09/09/2013; pathology showed intraductal papilloma with no atypia or malignancy identified.  Marland Kitchen PONV (postoperative nausea and vomiting)   . Pre-diabetes    pt denies  . Uterine fibroid     Past Surgical History:  Procedure Laterality Date  . [place]   right  . BREAST EXCISIONAL BIOPSY Left 2014   benign  . BREAST LUMPECTOMY WITH NEEDLE LOCALIZATION Left 09/09/2013   Procedure: BREAST LUMPECTOMY WITH NEEDLE LOCALIZATION;  Surgeon: Imogene Burn. Georgette Dover, MD;  Location: [facility];  Service: General;  Laterality: Left;  . CHOLECYSTECTOMY    . COLONOSCOPY    . POLYPECTOMY    . ROBOTIC ASSISTED LAPAROSCOPIC CHOLECYSTECTOMY  09/09/2019  . ROBOTIC ASSISTED TOTAL HYSTERECTOMY WITH BILATERAL SALPINGO OOPHERECTOMY N/A 10/22/2019   Procedure: XI ROBOTIC ASSISTED TOTAL HYSTERECTOMY WITH BILATERAL SALPINGO OOPHORECTOMY GREATER THAN 250 GRAMS, MINI LAPARTOMY FOR SPECIMEN DELIVERY;  PELVIC AND PERI-AORTIC LYMPHADENECTOMY;  Surgeon: Everitt Amber, MD;  Location: [facility];  Service: Gynecology;  Laterality: N/A;  . SENTINEL NODE BIOPSY N/A 10/22/2019   Procedure: SENTINEL NODE BIOPSY;  Surgeon: Everitt Amber, MD;  Location: [facility];  Service: Gynecology;  Laterality: N/A;    Allergies: Benicar hct [olmesartan medoxomil-hctz], Bactrim [sulfamethoxazole-trimethoprim], Pravastatin, Shellfish allergy, Amlodipine, and Iodinated diagnostic agents  Medications: Prior to Admission medications   Medication Sig Start Date End Date Taking? Authorizing Provider  APPLE CIDER VINEGAR PO Take 5 mLs by mouth daily.    Yes [provider]  chlorthalidone (HYGROTON) 25 MG tablet TAKE 1/2 TABLET BY MOUTH DAILY 05/10/20  Yes Isaac Bliss, Rayford Halsted, MD  Multiple Vitamin (MULTIVITAMIN) capsule Take 1 capsule by mouth daily.   Yes [provider]  OVER THE COUNTER MEDICATION Bioqueretin daily   Yes [provider]  atorvastatin (LIPITOR) 20 MG tablet Take 1 tablet (20 mg total) by mouth daily. 05/03/20   Isaac Bliss, Rayford Halsted, MD  bisacodyl (DUCODYL) 5 MG EC tablet Take 5 mg by mouth daily as needed for moderate constipation.    [provider]  dexamethasone (DECADRON) 4 MG tablet Take 2 tabs at the night before and 2 tab the morning of chemotherapy, every 3 weeks, by mouth 05/16/20   [name], MD  lidocaine-prilocaine (EMLA) cream Apply to affected area once 05/16/20   [name], MD  nabumetone (RELAFEN) 500 MG tablet Take 1 tablet (500 mg total) by mouth daily. Patient not taking: Reported on 05/03/2020 04/21/20   Faustino Congress, NP  Omega-3 Fatty Acids (FISH OIL PO) Take 500 mg by mouth every other day.    [provider]  ondansetron (ZOFRAN) 8 MG tablet Take 1 tablet (8 mg total) by mouth every 8 (eight) hours as needed. Start on the third day after chemotherapy. 05/16/20   [name], MD  Probiotic Product (PROBIOTIC DAILY PO) Take 1 tablet  by mouth every other day.    [provider]  prochlorperazine (COMPAZINE) 10 MG tablet Take 1 tablet (10 mg total) by mouth every 6 (six) hours as needed (Nausea or vomiting). 05/16/20   [name], MD  tiZANidine (ZANAFLEX) 2 MG tablet Take 1 tablet (2 mg total) by mouth every 6 (six) hours as needed for muscle spasms. 04/21/20   Faustino Congress, NP     Family History  Problem Relation Age of Onset  . Diabetes Mother   . Hypertension Mother   . Colon polyps Mother 53  . Dementia Mother 61  . Diabetes Father   . Congestive Heart Failure Father   . Pancreatic cancer Paternal Aunt   . Colon cancer Neg Hx   . Breast cancer Neg Hx   . Lung cancer Neg Hx   . Esophageal cancer Neg Hx   . Rectal cancer Neg Hx   . Stomach cancer Neg Hx     Social History   Socioeconomic History  . Marital status: Widowed    Spouse name: Not on file  . Number of children: Not on file  . Years of education: Not on file  . Highest education level: Not on file  Occupational History  . Not on file  Tobacco Use  . Smoking status: Never Smoker  . Smokeless tobacco: Never Used  . Tobacco comment: few puffs but not a true smoker quit many yrs ago  Substance and Sexual Activity  . Alcohol use: Never  . Drug use: Never  . Sexual activity: Not Currently  Other Topics Concern  . 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 Difficulty of Paying Living Expenses:   Food Insecurity:   . Worried About Charity fundraiser in the Last Year:   . Arboriculturist in the Last Year:   Transportation Needs:   . Film/video editor (Medical):   Marland Kitchen Lack of Transportation (Non-Medical):   Physical Activity:   . Days of Exercise per Week:   . Minutes of Exercise per Session:   Stress:   . Feeling of Stress :     Social Connections:   . Frequency of Communication with Friends and Family:   . Frequency of Social Gatherings with Friends and Family:   . Attends Religious Services:   . Active Member of Clubs or Organizations:   . Attends Archivist Meetings:   Marland Kitchen Marital Status:       Review of Systems denies fever, headache, chest pain, dyspnea, cough, abdominal/back pain, nausea, vomiting or bleeding.  She is anxious.  Vital Signs: BP 136/86   Pulse 79   Temp 97.7 F (36.5 C) (Oral)   Resp 18   SpO2 100%   Physical Exam awake, alert.  Chest clear to auscultation bilaterally.  Heart with regular rate and rhythm.  Abdomen  soft, positive bowel sounds, nontender.  No lower extremity edema.  Imaging: CT Abdomen Pelvis W Contrast  Result Date: 05/10/2020 CLINICAL DATA:  Stage IA endometrial cancer status post [facility] 10/22/2019. Patient declined recommended adjuvant therapy. Persistent new lower abdominal/pelvic cramping and pain. Restaging. EXAM: CT ABDOMEN AND PELVIS WITH CONTRAST TECHNIQUE: Multidetector CT imaging of the abdomen and pelvis was performed using the standard protocol following bolus administration of intravenous contrast. CONTRAST:  171mL OMNIPAQUE IOHEXOL 300 MG/ML  SOLN COMPARISON:  10/15/2019 CT chest, abdomen and pelvis. FINDINGS: Lower chest: No significant pulmonary nodules or acute consolidative airspace disease. Hepatobiliary: Normal liver size. There is a new hypodense 2.0 x 1.6 cm segment 4A left liver lobe mass (series 2/image 22). A few scattered small simple liver cysts are unchanged, largest 1.3 cm in the peripheral right liver. Several low-attenuation subcentimeter lesions scattered in the liver are too small to characterize and not definitely changed. Cholecystectomy. Bile ducts are stable and within normal post cholecystectomy limits with CBD diameter 4 mm. Pancreas: Normal, with no mass or duct dilation. Spleen: Normal size. No mass. Adrenals/Urinary Tract:  Normal adrenals. Low-attenuation subcentimeter interpolar right and lower left renal lesions are too small to characterize and unchanged. Otherwise normal kidneys with no hydronephrosis. Normal bladder. Stomach/Bowel: Normal non-distended stomach. Normal caliber small bowel with no small bowel wall thickening. Normal appendix. Oral contrast transits to the left colon. Moderate sigmoid diverticulosis, with no large bowel wall thickening or significant pericolonic fat stranding. Vascular/Lymphatic: Atherosclerotic nonaneurysmal abdominal aorta. Patent portal, splenic, hepatic and renal veins. Left pelvic sidewall 1.4 x 1.2 cm soft tissue nodule (series 2/image 74), not definitely seen on prior CT, suspicious for pathologic left internal iliac node. No additional pathologically enlarged lymph nodes in the abdomen or pelvis. Reproductive: Interval hysterectomy. Left vaginal cuff 1.6 x 1.3 cm soft tissue nodule (series 2/image 78). No adnexal masses. Other: No pneumoperitoneum, ascites or focal fluid collection. Musculoskeletal: No aggressive appearing focal osseous lesions. Moderate thoracolumbar spondylosis. IMPRESSION: 1. New hypodense 2.0 cm segment 4A left liver lobe mass, suspicious for hepatic metastasis. 2. New left pelvic sidewall 1.4 cm soft tissue nodule, suspicious for left internal iliac nodal metastasis. 3. New left vaginal cuff 1.6 x 1.3 cm soft tissue nodule, suspicious for recurrent tumor. 4. Aortic Atherosclerosis (ICD10-I70.0). Electronically Signed   By: Ilona Sorrel M.D.   On: 05/10/2020 13:48    Labs:  CBC: Recent Labs    10/21/19 1140 10/23/19 0314 05/03/20 1050 05/18/20 1310  WBC 4.3 9.1 4.3 4.4  HGB 12.5 11.0* 12.1 12.5  HCT 39.9 35.5* 37.8 39.7  PLT 219 209 277.0 259    COAGS: Recent Labs    05/18/20 1310  INR 1.1    BMP: Recent Labs    09/08/19 0833 09/08/19 0833 10/21/19 1140 10/23/19 0314 05/03/20 1050 05/18/20 1310  NA 138   < > 136 136 138 139  K 3.8   < >  3.7 3.5 3.6 3.4*  CL 99   < > 98 100 101 100  CO2 26   < > 29 26 30 28   GLUCOSE 92   < > 93 164* 96 101*  BUN 8   < > 13 12 11 8   CALCIUM 9.2   < > 9.2 8.4* 9.2 9.3  CREATININE 0.93   < > 0.89 0.89 0.88 0.98  GFRNONAA >60  --  >60 >60  --  60*  GFRAA >60  --  >60 >60  --  >60   < > =  values in this interval not displayed.    LIVER FUNCTION TESTS: Recent Labs    09/08/19 0833 10/21/19 1140 05/03/20 1050 05/18/20 1310  BILITOT 0.4 0.5 0.7 0.6  AST 19 20 36 50*  ALT 12 14 9 13   ALKPHOS 79 81 94 85  PROT 7.5 7.4 7.1 7.3  ALBUMIN 3.8 3.7 4.0 3.9    TUMOR MARKERS: No results for input(s): AFPTM, CEA, CA199, CHROMGRNA in the last 8760 hours.  Assessment and Plan: 70 y.o. female with history of recurrent endometrial carcinoma, initially diagnosed in September 2020, status post surgery but pt declined chemotherapy at the time. She now presents for port a cath placement for chemotherapy.Risks and benefits of image guided port-a-catheter placement was discussed with the patient including, but not limited to bleeding, infection, pneumothorax, or fibrin sheath development and need for additional procedures.  All of the patient's questions were answered, patient is agreeable to proceed. Consent signed and in chart.      Thank you for this interesting consult.  I greatly enjoyed meeting Tina Stone and look forward to participating in their care.  A copy of this report was sent to the requesting provider on this date.  Electronically Signed: D. Rowe Robert, PA-C 05/18/2020, 1:54 PM   I spent a total of 25 minutes    in face to face in clinical consultation, greater than 50% of which was counseling/coordinating care for Port-A-Cath placement

## 2020-05-18 NOTE — Procedures (Signed)
Pre Procedure Dx: Poor venous access Post Procedural Dx: Same  Successful placement of right IJ approach port-a-cath with tip at the superior caval atrial junction. The catheter is ready for immediate use.  Estimated Blood Loss: Minimal  Complications: None immediate.  Jay Lonney Revak, MD Pager #: [phone]   

## 2020-05-18 NOTE — Discharge Instructions (Signed)
Urgent needs - IR on call MD [phone]  Wound - May remove dressing and shower in 24 to 48 hours.  Keep site clean and dry.  Replace with bandaid. Do not submerge in tub or water until site healing well.  If ordered by your provider, may start Emla cream in approximately 2 weeks or after incision is healed.  After completion of treatment, your provider should have you set up for monthly port flushes.    Implanted Port Insertion, Care After This sheet gives you information about how to care for yourself after your procedure. Your health care provider may also give you more specific instructions. If you have problems or questions, contact your health care provider. What can I expect after the procedure? After the procedure, it is common to have:  Discomfort at the port insertion site.  Bruising on the skin over the port. This should improve over 3-4 days. Follow these instructions at home: [facility] care  After your port is placed, you will get a manufacturer's information card. The card has information about your port. Keep this card with you at all times.  Take care of the port as told by your health care provider. Ask your health care provider if you or a family member can get training for taking care of the port at home. A home health care nurse may also take care of the port.  Make sure to remember what type of port you have. Incision care  Follow instructions from your health care provider about how to take care of your port insertion site. Make sure you: ? Wash your hands with soap and water before and after you change your bandage (dressing). If soap and water are not available, use hand sanitizer. ? Change your dressing as told by your health care provider. ? Leave stitches (sutures), skin glue, or adhesive strips in place. These skin closures may need to stay in place for 2 weeks or longer. If adhesive strip edges start to loosen and curl up, you may trim the loose edges. Do not  remove adhesive strips completely unless your health care provider tells you to do that.  Check your port insertion site every day for signs of infection. Check for: ? Redness, swelling, or pain. ? Fluid or blood. ? Warmth. ? Pus or a bad smell. Activity  Return to your normal activities as told by your health care provider. Ask your health care provider what activities are safe for you.  Do not lift anything that is heavier than 10 lb (4.5 kg), or the limit that you are told, until your health care provider says that it is safe. General instructions  Take over-the-counter and prescription medicines only as told by your health care provider.  Do not take baths, swim, or use a hot tub until your health care provider approves. Ask your health care provider if you may take showers. You may only be allowed to take sponge baths.  Do not drive for 24 hours if you were given a sedative during your procedure.  Wear a medical alert bracelet in case of an emergency. This will tell any health care providers that you have a port.  Keep all follow-up visits as told by your health care provider. This is important. Contact a health care provider if:  You cannot flush your port with saline as directed, or you cannot draw blood from the port.  You have a fever or chills.  You have redness, swelling, or pain around your  port insertion site.  You have fluid or blood coming from your port insertion site.  Your port insertion site feels warm to the touch.  You have pus or a bad smell coming from the port insertion site. Get help right away if:  You have chest pain or shortness of breath.  You have bleeding from your port that you cannot control. Summary  Take care of the port as told by your health care provider. Keep the manufacturer's information card with you at all times.  Change your dressing as told by your health care provider.  Contact a health care provider if you have a fever or  chills or if you have redness, swelling, or pain around your port insertion site.  Keep all follow-up visits as told by your health care provider. This information is not intended to replace advice given to you by your health care provider. Make sure you discuss any questions you have with your health care provider. Document Revised: 07/15/2018 Document Reviewed: 07/15/2018 Elsevier Patient Education  [place].   Moderate Conscious Sedation, Adult, Care After These instructions provide you with information about caring for yourself after your procedure. Your health care provider may also give you more specific instructions. Your treatment has been planned according to current medical practices, but problems sometimes occur. Call your health care provider if you have any problems or questions after your procedure. What can I expect after the procedure? After your procedure, it is common:  To feel sleepy for several hours.  To feel clumsy and have poor balance for several hours.  To have poor judgment for several hours.  To vomit if you eat too soon. Follow these instructions at home: For at least 24 hours after the procedure:  Do not: ? Participate in activities where you could fall or become injured. ? Drive. ? Use heavy machinery. ? Drink alcohol. ? Take sleeping pills or medicines that cause drowsiness. ? Make important decisions or sign legal documents. ? Take care of children on your own.  Rest. Eating and drinking  Follow the diet recommended by your health care provider.  If you vomit: ? Drink water, juice, or soup when you can drink without vomiting. ? Make sure you have little or no nausea before eating solid foods. General instructions  Have a responsible adult stay with you until you are awake and alert.  Take over-the-counter and prescription medicines only as told by your health care provider.  If you smoke, do not smoke without supervision.  Keep  all follow-up visits as told by your health care provider. This is important. Contact a health care provider if:  You keep feeling nauseous or you keep vomiting.  You feel light-headed.  You develop a rash.  You have a fever. Get help right away if:  You have trouble breathing. This information is not intended to replace advice given to you by your health care provider. Make sure you discuss any questions you have with your health care provider. Document Revised: 11/29/2017 Document Reviewed: 04/07/2016 Elsevier Patient Education  2020 [employer].

## 2020-05-18 NOTE — Telephone Encounter (Signed)
Requested CMP along with CBC p diff to be drawn today with Tiffany in IR.

## 2020-05-19 ENCOUNTER — Telehealth: Payer: Self-pay

## 2020-05-19 ENCOUNTER — Other Ambulatory Visit: Payer: Self-pay | Admitting: Hematology and Oncology

## 2020-05-19 NOTE — Telephone Encounter (Signed)
-----   Message from [name], MD sent at 05/19/2020  6:57 AM EDT ----- Regarding: remind her she has to take dexa sunday night and Monday morning of chemo

## 2020-05-19 NOTE — Telephone Encounter (Signed)
Called and given below message. She verbalized understanding. 

## 2020-05-23 ENCOUNTER — Inpatient Hospital Stay: Payer: Medicare Other

## 2020-05-23 ENCOUNTER — Encounter: Payer: Self-pay | Admitting: Oncology

## 2020-05-23 ENCOUNTER — Other Ambulatory Visit: Payer: Self-pay

## 2020-05-23 ENCOUNTER — Other Ambulatory Visit: Payer: Self-pay | Admitting: Oncology

## 2020-05-23 VITALS — BP 123/70 | HR 63 | Temp 98.0°F | Resp 20 | Wt 214.0 lb

## 2020-05-23 DIAGNOSIS — Z7189 Other specified counseling: Secondary | ICD-10-CM

## 2020-05-23 DIAGNOSIS — Z5111 Encounter for antineoplastic chemotherapy: Secondary | ICD-10-CM | POA: Diagnosis not present

## 2020-05-23 DIAGNOSIS — Z791 Long term (current) use of non-steroidal anti-inflammatories (NSAID): Secondary | ICD-10-CM | POA: Diagnosis not present

## 2020-05-23 DIAGNOSIS — C541 Malignant neoplasm of endometrium: Secondary | ICD-10-CM

## 2020-05-23 DIAGNOSIS — Z7952 Long term (current) use of systemic steroids: Secondary | ICD-10-CM | POA: Diagnosis not present

## 2020-05-23 DIAGNOSIS — Z79899 Other long term (current) drug therapy: Secondary | ICD-10-CM | POA: Diagnosis not present

## 2020-05-23 MED ORDER — DIPHENHYDRAMINE HCL 50 MG/ML IJ SOLN
INTRAMUSCULAR | Status: AC
  Filled 2020-05-23: qty 1

## 2020-05-23 MED ORDER — SODIUM CHLORIDE 0.9 % IV SOLN
150.0000 mg | Freq: Once | INTRAVENOUS | Status: AC
  Administered 2020-05-23: 150 mg via INTRAVENOUS
  Filled 2020-05-23: qty 150

## 2020-05-23 MED ORDER — SODIUM CHLORIDE 0.9% FLUSH
10.0000 mL | INTRAVENOUS | Status: DC | PRN
  Administered 2020-05-23: 10 mL
  Filled 2020-05-23: qty 10

## 2020-05-23 MED ORDER — SODIUM CHLORIDE 0.9 % IV SOLN
175.0000 mg/m2 | Freq: Once | INTRAVENOUS | Status: AC
  Administered 2020-05-23: 312 mg via INTRAVENOUS
  Filled 2020-05-23: qty 52

## 2020-05-23 MED ORDER — HEPARIN SOD (PORK) LOCK FLUSH 100 UNIT/ML IV SOLN
500.0000 [IU] | Freq: Once | INTRAVENOUS | Status: AC | PRN
  Administered 2020-05-23: 500 [IU]
  Filled 2020-05-23: qty 5

## 2020-05-23 MED ORDER — SODIUM CHLORIDE 0.9 % IV SOLN
10.0000 mg | Freq: Once | INTRAVENOUS | Status: AC
  Administered 2020-05-23: 10 mg via INTRAVENOUS
  Filled 2020-05-23: qty 10

## 2020-05-23 MED ORDER — SODIUM CHLORIDE 0.9 % IV SOLN
514.8000 mg | Freq: Once | INTRAVENOUS | Status: AC
  Administered 2020-05-23: 510 mg via INTRAVENOUS
  Filled 2020-05-23: qty 51

## 2020-05-23 MED ORDER — DIPHENHYDRAMINE HCL 50 MG/ML IJ SOLN
50.0000 mg | Freq: Once | INTRAMUSCULAR | Status: AC
  Administered 2020-05-23: 50 mg via INTRAVENOUS

## 2020-05-23 MED ORDER — FAMOTIDINE IN NACL 20-0.9 MG/50ML-% IV SOLN
INTRAVENOUS | Status: AC
  Filled 2020-05-23: qty 50

## 2020-05-23 MED ORDER — PALONOSETRON HCL INJECTION 0.25 MG/5ML
0.2500 mg | Freq: Once | INTRAVENOUS | Status: AC
  Administered 2020-05-23: 0.25 mg via INTRAVENOUS

## 2020-05-23 MED ORDER — SODIUM CHLORIDE 0.9 % IV SOLN
Freq: Once | INTRAVENOUS | Status: AC
  Filled 2020-05-23: qty 250

## 2020-05-23 MED ORDER — PALONOSETRON HCL INJECTION 0.25 MG/5ML
INTRAVENOUS | Status: AC
  Filled 2020-05-23: qty 5

## 2020-05-23 MED ORDER — FAMOTIDINE IN NACL 20-0.9 MG/50ML-% IV SOLN
20.0000 mg | Freq: Once | INTRAVENOUS | Status: AC
  Administered 2020-05-23: 20 mg via INTRAVENOUS

## 2020-05-23 NOTE — Progress Notes (Signed)
Gynecologic Oncology Multi-Disciplinary Disposition Conference Note  Date of the Conference: 05/23/2020  Patient Name: Tina Stone  Referring Provider: Dr. Purvis Kilts Primary GYN Oncologist: Dr. Denman George  Stage/Disposition:  Stage IA, grade 3 recurrent serous/clear cell endometrial cancer. Disposition is to salvage chemotherapy with carboplatin and Taxol. Restaging after 3 cycles.  If progression, consideration for clinical trials at [facility].   This Multidisciplinary conference took place involving physicians from [place], [place], Radiation Oncology, Pathology, Radiology along with the Gynecologic Oncology Nurse Practitioner and RN.  Comprehensive assessment of the patient's malignancy, staging, need for surgery, chemotherapy, radiation therapy, and need for further testing were reviewed. Supportive measures, both inpatient and following discharge were also discussed. The recommended plan of care is documented. Greater than 35 minutes were spent correlating and coordinating this patient's care.

## 2020-05-23 NOTE — Progress Notes (Signed)
Met with Tina Stone during her first cycle of chemotherapy.  Encouraged her to call with any questions or needs.

## 2020-05-23 NOTE — Patient Instructions (Signed)
[facility] Discharge Instructions for Patients Receiving Chemotherapy  Today you received the following chemotherapy agents: Taxol, Carboplatin  To help prevent nausea and vomiting after your treatment, we encourage you to take your nausea medication as directed.   If you develop nausea and vomiting that is not controlled by your nausea medication, call the clinic.   BELOW ARE SYMPTOMS THAT SHOULD BE REPORTED IMMEDIATELY:  *FEVER GREATER THAN 100.5 F  *CHILLS WITH OR WITHOUT FEVER  NAUSEA AND VOMITING THAT IS NOT CONTROLLED WITH YOUR NAUSEA MEDICATION  *UNUSUAL SHORTNESS OF BREATH  *UNUSUAL BRUISING OR BLEEDING  TENDERNESS IN MOUTH AND THROAT WITH OR WITHOUT PRESENCE OF ULCERS  *URINARY PROBLEMS  *BOWEL PROBLEMS  UNUSUAL RASH Items with * indicate a potential emergency and should be followed up as soon as possible.  Feel free to call the clinic should you have any questions or concerns. The clinic phone number is [phone].  Please show the CHEMO ALERT CARD at check-in to the Emergency Department and triage nurse.  Paclitaxel injection What is this medicine? PACLITAXEL (PAK li TAX el) is a chemotherapy drug. It targets fast dividing cells, like cancer cells, and causes these cells to die. This medicine is used to treat ovarian cancer, breast cancer, lung cancer, Kaposi's sarcoma, and other cancers. This medicine may be used for other purposes; ask your health care provider or pharmacist if you have questions. COMMON BRAND NAME(S): Onxol, Taxol What should I tell my health care provider before I take this medicine? They need to know if you have any of these conditions:  history of irregular heartbeat  liver disease  low blood counts, like low white cell, platelet, or red cell counts  lung or breathing disease, like asthma  tingling of the fingers or toes, or other nerve disorder  an unusual or allergic reaction to paclitaxel, alcohol,  polyoxyethylated castor oil, other chemotherapy, other medicines, foods, dyes, or preservatives  pregnant or trying to get pregnant  breast-feeding How should I use this medicine? This drug is given as an infusion into a vein. It is administered in a hospital or clinic by a specially trained health care professional. Talk to your pediatrician regarding the use of this medicine in children. Special care may be needed. Overdosage: If you think you have taken too much of this medicine contact a poison control center or emergency room at once. NOTE: This medicine is only for you. Do not share this medicine with others. What if I miss a dose? It is important not to miss your dose. Call your doctor or health care professional if you are unable to keep an appointment. What may interact with this medicine? Do not take this medicine with any of the following medications:  disulfiram  metronidazole This medicine may also interact with the following medications:  antiviral medicines for hepatitis, HIV or AIDS  certain antibiotics like erythromycin and clarithromycin  certain medicines for fungal infections like ketoconazole and itraconazole  certain medicines for seizures like carbamazepine, phenobarbital, phenytoin  gemfibrozil  nefazodone  rifampin  St. John's wort This list may not describe all possible interactions. Give your health care provider a list of all the medicines, herbs, non-prescription drugs, or dietary supplements you use. Also tell them if you smoke, drink alcohol, or use illegal drugs. Some items may interact with your medicine. What should I watch for while using this medicine? Your condition will be monitored carefully while you are receiving this medicine. You will need important blood   work done while you are taking this medicine. This medicine can cause serious allergic reactions. To reduce your risk you will need to take other medicine(s) before treatment with this  medicine. If you experience allergic reactions like skin rash, itching or hives, swelling of the face, lips, or tongue, tell your doctor or health care professional right away. In some cases, you may be given additional medicines to help with side effects. Follow all directions for their use. This drug may make you feel generally unwell. This is not uncommon, as chemotherapy can affect healthy cells as well as cancer cells. Report any side effects. Continue your course of treatment even though you feel ill unless your doctor tells you to stop. Call your doctor or health care professional for advice if you get a fever, chills or sore throat, or other symptoms of a cold or flu. Do not treat yourself. This drug decreases your body's ability to fight infections. Try to avoid being around people who are sick. This medicine may increase your risk to bruise or bleed. Call your doctor or health care professional if you notice any unusual bleeding. Be careful brushing and flossing your teeth or using a toothpick because you may get an infection or bleed more easily. If you have any dental work done, tell your dentist you are receiving this medicine. Avoid taking products that contain aspirin, acetaminophen, ibuprofen, naproxen, or ketoprofen unless instructed by your doctor. These medicines may hide a fever. Do not become pregnant while taking this medicine. Women should inform their doctor if they wish to become pregnant or think they might be pregnant. There is a potential for serious side effects to an unborn child. Talk to your health care professional or pharmacist for more information. Do not breast-feed an infant while taking this medicine. Men are advised not to father a child while receiving this medicine. This product may contain alcohol. Ask your pharmacist or healthcare provider if this medicine contains alcohol. Be sure to tell all healthcare providers you are taking this medicine. Certain medicines,  like metronidazole and disulfiram, can cause an unpleasant reaction when taken with alcohol. The reaction includes flushing, headache, nausea, vomiting, sweating, and increased thirst. The reaction can last from 30 minutes to several hours. What side effects may I notice from receiving this medicine? Side effects that you should report to your doctor or health care professional as soon as possible:  allergic reactions like skin rash, itching or hives, swelling of the face, lips, or tongue  breathing problems  changes in vision  fast, irregular heartbeat  high or low blood pressure  mouth sores  pain, tingling, numbness in the hands or feet  signs of decreased platelets or bleeding - bruising, pinpoint red spots on the skin, black, tarry stools, blood in the urine  signs of decreased red blood cells - unusually weak or tired, feeling faint or lightheaded, falls  signs of infection - fever or chills, cough, sore throat, pain or difficulty passing urine  signs and symptoms of liver injury like dark yellow or brown urine; general ill feeling or flu-like symptoms; light-colored stools; loss of appetite; nausea; right upper belly pain; unusually weak or tired; yellowing of the eyes or skin  swelling of the ankles, feet, hands  unusually slow heartbeat Side effects that usually do not require medical attention (report to your doctor or health care professional if they continue or are bothersome):  diarrhea  hair loss  loss of appetite  muscle or joint pain    nausea, vomiting  pain, redness, or irritation at site where injected  tiredness This list may not describe all possible side effects. Call your doctor for medical advice about side effects. You may report side effects to FDA at 1-800-FDA-1088. Where should I keep my medicine? This drug is given in a hospital or clinic and will not be stored at home. NOTE: This sheet is a summary. It may not cover all possible information.  If you have questions about this medicine, talk to your doctor, pharmacist, or health care provider.  2020 Elsevier/Gold Standard (2017-08-20 13:14:55)  Carboplatin injection What is this medicine? CARBOPLATIN (KAR boe pla tin) is a chemotherapy drug. It targets fast dividing cells, like cancer cells, and causes these cells to die. This medicine is used to treat ovarian cancer and many other cancers. This medicine may be used for other purposes; ask your health care provider or pharmacist if you have questions. COMMON BRAND NAME(S): Paraplatin What should I tell my health care provider before I take this medicine? They need to know if you have any of these conditions:  blood disorders  hearing problems  kidney disease  recent or ongoing radiation therapy  an unusual or allergic reaction to carboplatin, cisplatin, other chemotherapy, other medicines, foods, dyes, or preservatives  pregnant or trying to get pregnant  breast-feeding How should I use this medicine? This drug is usually given as an infusion into a vein. It is administered in a hospital or clinic by a specially trained health care professional. Talk to your pediatrician regarding the use of this medicine in children. Special care may be needed. Overdosage: If you think you have taken too much of this medicine contact a poison control center or emergency room at once. NOTE: This medicine is only for you. Do not share this medicine with others. What if I miss a dose? It is important not to miss a dose. Call your doctor or health care professional if you are unable to keep an appointment. What may interact with this medicine?  medicines for seizures  medicines to increase blood counts like filgrastim, pegfilgrastim, sargramostim  some antibiotics like amikacin, gentamicin, neomycin, streptomycin, tobramycin  vaccines Talk to your doctor or health care professional before taking any of these  medicines:  acetaminophen  aspirin  ibuprofen  ketoprofen  naproxen This list may not describe all possible interactions. Give your health care provider a list of all the medicines, herbs, non-prescription drugs, or dietary supplements you use. Also tell them if you smoke, drink alcohol, or use illegal drugs. Some items may interact with your medicine. What should I watch for while using this medicine? Your condition will be monitored carefully while you are receiving this medicine. You will need important blood work done while you are taking this medicine. This drug may make you feel generally unwell. This is not uncommon, as chemotherapy can affect healthy cells as well as cancer cells. Report any side effects. Continue your course of treatment even though you feel ill unless your doctor tells you to stop. In some cases, you may be given additional medicines to help with side effects. Follow all directions for their use. Call your doctor or health care professional for advice if you get a fever, chills or sore throat, or other symptoms of a cold or flu. Do not treat yourself. This drug decreases your body's ability to fight infections. Try to avoid being around people who are sick. This medicine may increase your risk to bruise or bleed.   Call your doctor or health care professional if you notice any unusual bleeding. Be careful brushing and flossing your teeth or using a toothpick because you may get an infection or bleed more easily. If you have any dental work done, tell your dentist you are receiving this medicine. Avoid taking products that contain aspirin, acetaminophen, ibuprofen, naproxen, or ketoprofen unless instructed by your doctor. These medicines may hide a fever. Do not become pregnant while taking this medicine. Women should inform their doctor if they wish to become pregnant or think they might be pregnant. There is a potential for serious side effects to an unborn child. Talk  to your health care professional or pharmacist for more information. Do not breast-feed an infant while taking this medicine. What side effects may I notice from receiving this medicine? Side effects that you should report to your doctor or health care professional as soon as possible:  allergic reactions like skin rash, itching or hives, swelling of the face, lips, or tongue  signs of infection - fever or chills, cough, sore throat, pain or difficulty passing urine  signs of decreased platelets or bleeding - bruising, pinpoint red spots on the skin, black, tarry stools, nosebleeds  signs of decreased red blood cells - unusually weak or tired, fainting spells, lightheadedness  breathing problems  changes in hearing  changes in vision  chest pain  high blood pressure  low blood counts - This drug may decrease the number of white blood cells, red blood cells and platelets. You may be at increased risk for infections and bleeding.  nausea and vomiting  pain, swelling, redness or irritation at the injection site  pain, tingling, numbness in the hands or feet  problems with balance, talking, walking  trouble passing urine or change in the amount of urine Side effects that usually do not require medical attention (report to your doctor or health care professional if they continue or are bothersome):  hair loss  loss of appetite  metallic taste in the mouth or changes in taste This list may not describe all possible side effects. Call your doctor for medical advice about side effects. You may report side effects to FDA at 1-800-FDA-1088. Where should I keep my medicine? This drug is given in a hospital or clinic and will not be stored at home. NOTE: This sheet is a summary. It may not cover all possible information. If you have questions about this medicine, talk to your doctor, pharmacist, or health care provider.  2020 Elsevier/Gold Standard (2008-03-23 14:38:05)  

## 2020-05-24 ENCOUNTER — Telehealth: Payer: Self-pay | Admitting: Emergency Medicine

## 2020-05-24 NOTE — Telephone Encounter (Signed)
Chemo f/u call, spoke with patient.  Pt denies any N/V or diarrhea, reports only loose stools w/out abd pain.  Pt advised to monitor stools and take OTC Imodium if it persists/turns into diarrhea and to push oral fluids.  Pt advised to f/u as needed with any questions/concerns or if the loose stools persist over next few days, verbalized understanding and denies any further needs at this time.

## 2020-05-24 NOTE — Telephone Encounter (Signed)
-----   Message from Lillia Corporal, RN sent at 05/23/2020  3:56 PM EDT ----- Regarding: Dr. Alvy Bimler first time chemo First time Taxol/Carboplatin

## 2020-05-27 ENCOUNTER — Telehealth: Payer: Self-pay

## 2020-05-27 ENCOUNTER — Other Ambulatory Visit: Payer: Self-pay | Admitting: Hematology and Oncology

## 2020-05-27 DIAGNOSIS — C541 Malignant neoplasm of endometrium: Secondary | ICD-10-CM

## 2020-05-27 DIAGNOSIS — M898X9 Other specified disorders of bone, unspecified site: Secondary | ICD-10-CM

## 2020-05-27 MED ORDER — OXYCODONE HCL 5 MG PO TABS: 5.0000 mg | ORAL_TABLET | ORAL | 0 refills | Status: DC | PRN

## 2020-05-27 MED FILL — oxyCODONE HCL 5 MG TABS: 5 | 5 days supply | Qty: 30 | Fill #0

## 2020-05-27 NOTE — Telephone Encounter (Signed)
The joint pain is from chemo. I suggest small doses of pain medications along with tylenol No need appt today but we can bring her in Tuesday if not better Let me know what she decides and I can send prescription to her pharmacy

## 2020-05-27 NOTE — Telephone Encounter (Signed)
Called and given below message. She verbalized understanding. She would like a Rx and ask that it be sent to [facility] outpatient pharmacy. She will call back Tuesday with a update.

## 2020-05-27 NOTE — Telephone Encounter (Signed)
Called and given below message. She verbalized understanding. 

## 2020-05-27 NOTE — Telephone Encounter (Signed)
Sent to [facility] to take tylenol along as needed Watch out for constipation

## 2020-05-27 NOTE — Telephone Encounter (Signed)
She called and left a message to call her. She called the after hours number and spoke with the staff. She was told to call the office for appt today.  Called her back. She is complaining of joint and muscle pain that started yesterday. It was really severe last night. She took tylenol last night and is requesting appt. She thinks that her potassium may be low. Instructed to take tylenol now and I will call her back.

## 2020-05-31 ENCOUNTER — Telehealth: Payer: Self-pay

## 2020-05-31 NOTE — Telephone Encounter (Signed)
Called pt to f/u on leg pain per Dr Alvy Bimler. Pt states Tylenol helped her pain and she did not pick up prescription as she feels she does not need it, and that she feels it was pain from exertion. Pt verbalized thanks for reaching out.

## 2020-05-31 NOTE — Telephone Encounter (Signed)
-----   Message from [name], MD sent at 05/31/2020  9:00 AM EDT ----- Regarding: can you call and ask how is her pain doing?

## 2020-06-13 ENCOUNTER — Inpatient Hospital Stay (HOSPITAL_BASED_OUTPATIENT_CLINIC_OR_DEPARTMENT_OTHER): Payer: Medicare Other | Admitting: Hematology and Oncology

## 2020-06-13 ENCOUNTER — Other Ambulatory Visit: Payer: Self-pay | Admitting: Lab

## 2020-06-13 ENCOUNTER — Inpatient Hospital Stay: Payer: Medicare Other

## 2020-06-13 ENCOUNTER — Inpatient Hospital Stay: Payer: Medicare Other | Attending: Gynecologic Oncology

## 2020-06-13 ENCOUNTER — Other Ambulatory Visit: Payer: Self-pay

## 2020-06-13 DIAGNOSIS — Z79899 Other long term (current) drug therapy: Secondary | ICD-10-CM | POA: Diagnosis not present

## 2020-06-13 DIAGNOSIS — C541 Malignant neoplasm of endometrium: Secondary | ICD-10-CM

## 2020-06-13 DIAGNOSIS — Z7189 Other specified counseling: Secondary | ICD-10-CM

## 2020-06-13 DIAGNOSIS — C787 Secondary malignant neoplasm of liver and intrahepatic bile duct: Secondary | ICD-10-CM | POA: Insufficient documentation

## 2020-06-13 DIAGNOSIS — M898X9 Other specified disorders of bone, unspecified site: Secondary | ICD-10-CM | POA: Diagnosis not present

## 2020-06-13 DIAGNOSIS — Z5111 Encounter for antineoplastic chemotherapy: Secondary | ICD-10-CM | POA: Insufficient documentation

## 2020-06-13 LAB — CBC WITH DIFFERENTIAL (CANCER CENTER ONLY)
Abs Immature Granulocytes: 0.1 10*3/uL — ABNORMAL HIGH (ref 0.00–0.07)
Basophils Absolute: 0 10*3/uL (ref 0.0–0.1)
Basophils Relative: 0 %
Eosinophils Absolute: 0 10*3/uL (ref 0.0–0.5)
Eosinophils Relative: 0 %
HCT: 36.6 % (ref 36.0–46.0)
Hemoglobin: 11.9 g/dL — ABNORMAL LOW (ref 12.0–15.0)
Immature Granulocytes: 1 %
Lymphocytes Relative: 10 %
Lymphs Abs: 0.7 10*3/uL (ref 0.7–4.0)
MCH: 24.3 pg — ABNORMAL LOW (ref 26.0–34.0)
MCHC: 32.5 g/dL (ref 30.0–36.0)
MCV: 74.7 fL — ABNORMAL LOW (ref 80.0–100.0)
Monocytes Absolute: 0.2 10*3/uL (ref 0.1–1.0)
Monocytes Relative: 3 %
Neutro Abs: 6 10*3/uL (ref 1.7–7.7)
Neutrophils Relative %: 86 %
Platelet Count: 220 10*3/uL (ref 150–400)
RBC: 4.9 MIL/uL (ref 3.87–5.11)
RDW: 17.2 % — ABNORMAL HIGH (ref 11.5–15.5)
WBC Count: 7 10*3/uL (ref 4.0–10.5)
nRBC: 0 % (ref 0.0–0.2)

## 2020-06-13 LAB — CMP (CANCER CENTER ONLY)
ALT: 12 U/L (ref 0–44)
AST: 17 U/L (ref 15–41)
Albumin: 3.7 g/dL (ref 3.5–5.0)
Alkaline Phosphatase: 129 U/L — ABNORMAL HIGH (ref 38–126)
Anion gap: 11 (ref 5–15)
BUN: 12 mg/dL (ref 8–23)
CO2: 25 mmol/L (ref 22–32)
Calcium: 10 mg/dL (ref 8.9–10.3)
Chloride: 103 mmol/L (ref 98–111)
Creatinine: 0.81 mg/dL (ref 0.44–1.00)
GFR, Est AFR Am: >60 mL/min (ref >60)
GFR, Estimated: >60 mL/min (ref >60)
Glucose, Bld: 124 mg/dL — ABNORMAL HIGH (ref 70–99)
Potassium: 3.9 mmol/L (ref 3.5–5.1)
Sodium: 139 mmol/L (ref 135–145)
Total Bilirubin: 0.3 mg/dL (ref 0.3–1.2)
Total Protein: 7.7 g/dL (ref 6.5–8.1)

## 2020-06-13 MED ORDER — SODIUM CHLORIDE 0.9% FLUSH
10.0000 mL | INTRAVENOUS | Status: DC | PRN
  Administered 2020-06-13: 10 mL
  Filled 2020-06-13: qty 10

## 2020-06-13 MED ORDER — SODIUM CHLORIDE 0.9 % IV SOLN
10.0000 mg | Freq: Once | INTRAVENOUS | Status: AC
  Administered 2020-06-13: 10 mg via INTRAVENOUS
  Filled 2020-06-13: qty 10

## 2020-06-13 MED ORDER — DIPHENHYDRAMINE HCL 50 MG/ML IJ SOLN
INTRAMUSCULAR | Status: AC
  Filled 2020-06-13: qty 1

## 2020-06-13 MED ORDER — SODIUM CHLORIDE 0.9 % IV SOLN
514.8000 mg | Freq: Once | INTRAVENOUS | Status: AC
  Administered 2020-06-13: 510 mg via INTRAVENOUS
  Filled 2020-06-13: qty 51

## 2020-06-13 MED ORDER — SODIUM CHLORIDE 0.9 % IV SOLN
175.0000 mg/m2 | Freq: Once | INTRAVENOUS | Status: AC
  Administered 2020-06-13: 312 mg via INTRAVENOUS
  Filled 2020-06-13: qty 52

## 2020-06-13 MED ORDER — FAMOTIDINE IN NACL 20-0.9 MG/50ML-% IV SOLN
20.0000 mg | Freq: Once | INTRAVENOUS | Status: AC
  Administered 2020-06-13: 20 mg via INTRAVENOUS

## 2020-06-13 MED ORDER — SODIUM CHLORIDE 0.9 % IV SOLN
Freq: Once | INTRAVENOUS | Status: AC
  Filled 2020-06-13: qty 250

## 2020-06-13 MED ORDER — SODIUM CHLORIDE 0.9 % IV SOLN
150.0000 mg | Freq: Once | INTRAVENOUS | Status: AC
  Administered 2020-06-13: 150 mg via INTRAVENOUS
  Filled 2020-06-13: qty 150

## 2020-06-13 MED ORDER — SODIUM CHLORIDE 0.9% FLUSH
10.0000 mL | Freq: Once | INTRAVENOUS | Status: AC
  Administered 2020-06-13: 10 mL
  Filled 2020-06-13: qty 10

## 2020-06-13 MED ORDER — DIPHENHYDRAMINE HCL 50 MG/ML IJ SOLN
50.0000 mg | Freq: Once | INTRAMUSCULAR | Status: AC
  Administered 2020-06-13: 50 mg via INTRAVENOUS

## 2020-06-13 MED ORDER — PALONOSETRON HCL INJECTION 0.25 MG/5ML
0.2500 mg | Freq: Once | INTRAVENOUS | Status: AC
  Administered 2020-06-13: 0.25 mg via INTRAVENOUS

## 2020-06-13 MED ORDER — PALONOSETRON HCL INJECTION 0.25 MG/5ML
INTRAVENOUS | Status: AC
  Filled 2020-06-13: qty 5

## 2020-06-13 MED ORDER — HEPARIN SOD (PORK) LOCK FLUSH 100 UNIT/ML IV SOLN
500.0000 [IU] | Freq: Once | INTRAVENOUS | Status: AC | PRN
  Administered 2020-06-13: 500 [IU]
  Filled 2020-06-13: qty 5

## 2020-06-13 MED ORDER — FAMOTIDINE IN NACL 20-0.9 MG/50ML-% IV SOLN
INTRAVENOUS | Status: AC
  Filled 2020-06-13: qty 50

## 2020-06-13 NOTE — Patient Instructions (Signed)

## 2020-06-13 NOTE — Patient Instructions (Signed)
[facility] Discharge Instructions for Patients Receiving Chemotherapy  Today you received the following chemotherapy agents Taxol, Carboplatin  To help prevent nausea and vomiting after your treatment, we encourage you to take your nausea medication as directed  If you develop nausea and vomiting that is not controlled by your nausea medication, call the clinic.   BELOW ARE SYMPTOMS THAT SHOULD BE REPORTED IMMEDIATELY:  *FEVER GREATER THAN 100.5 F  *CHILLS WITH OR WITHOUT FEVER  NAUSEA AND VOMITING THAT IS NOT CONTROLLED WITH YOUR NAUSEA MEDICATION  *UNUSUAL SHORTNESS OF BREATH  *UNUSUAL BRUISING OR BLEEDING  TENDERNESS IN MOUTH AND THROAT WITH OR WITHOUT PRESENCE OF ULCERS  *URINARY PROBLEMS  *BOWEL PROBLEMS  UNUSUAL RASH Items with * indicate a potential emergency and should be followed up as soon as possible.  Feel free to call the clinic should you have any questions or concerns. The clinic phone number is [phone].  Please show the CHEMO ALERT CARD at check-in to the Emergency Department and triage nurse.   

## 2020-06-14 ENCOUNTER — Telehealth: Payer: Self-pay | Admitting: Hematology and Oncology

## 2020-06-14 ENCOUNTER — Encounter: Payer: Self-pay | Admitting: Hematology and Oncology

## 2020-06-14 DIAGNOSIS — C787 Secondary malignant neoplasm of liver and intrahepatic bile duct: Secondary | ICD-10-CM | POA: Insufficient documentation

## 2020-06-14 NOTE — Assessment & Plan Note (Signed)
She has generalized bone pain after treatment which resolved with conservative approach We discussed the importance of aggressive pain management while on therapy

## 2020-06-14 NOTE — Telephone Encounter (Signed)
Scheduled per 6/15 los. Pt aware of appts on 7/8.

## 2020-06-14 NOTE — Assessment & Plan Note (Signed)
Her liver enzymes are within normal limits We will continue treatment without delay and will be watching her liver function carefully

## 2020-06-14 NOTE — Progress Notes (Signed)
[place] OFFICE PROGRESS NOTE  Patient Care Team: Isaac Bliss, Rayford Halsted, MD as PCP - General (Internal Medicine) Axel Filler, MD  ASSESSMENT & PLAN:  Endometrial cancer [facility]) Overall, she tolerated treatment well except for some mild bone pain which resolved with conservative approach She denies other side effects so far The plan will be to continue for minimum 3 cycles of treatment before repeating imaging study She is in agreement with the plan of care  Bone pain She has generalized bone pain after treatment which resolved with conservative approach We discussed the importance of aggressive pain management while on therapy  Metastasis to liver [facility]) Her liver enzymes are within normal limits We will continue treatment without delay and will be watching her liver function carefully   No orders of the defined types were placed in this encounter.   All questions were answered. The patient knows to call the clinic with any problems, questions or concerns. The total time spent in the appointment was 20 minutes encounter with patients including review of chart and various tests results, discussions about plan of care and coordination of care plan   [name], MD 06/14/2020 9:25 AM  INTERVAL HISTORY: Please see below for problem oriented charting. She is seen prior to cycle 2 of treatment She developed severe bone pain after treatment but that has subsequently resolved She denies major nausea or constipation No peripheral neuropathy Denies recent infection, fever or chills  SUMMARY OF ONCOLOGIC HISTORY: Oncology History Overview Note  Poorly differentiated carcinoma, mixed histology with squamous differentiation, rare focus of clear cells as well as serous features MSI stable Her2 negative   Endometrial cancer ([place])  09/01/2019 Initial Diagnosis   The patient reported a history of postmenopausal bleeding that began 1 to 2 months before  diagnosis   09/14/2019 Imaging   US pelvis 1. Enlarged uterus with numerous myometrial masses, presumably fibroids. 2. Endometrial thickness of 6.2 mm. In the setting of post-menopausal bleeding, endometrial sampling is indicated to exclude carcinoma. If results are benign, sonohysterogram should be considered for focal lesion work-up.  3. Nonvisualized ovaries   09/23/2019 Pathology Results   A. ENDOMETRIUM, BIOPSY:  - Poorly differentiated carcinoma   10/15/2019 Imaging   Ct scan of chest, abdomen and pelvis: No evidence of metastatic disease within the chest, abdomen, or pelvis.   Enlarged fibroid uterus.   Colonic diverticulosis. No radiographic evidence of diverticulitis.   Aortic and coronary artery atherosclerosis.     10/22/2019 Pathology Results   SURGICAL PATHOLOGY   FINAL MICROSCOPIC DIAGNOSIS:   A. UTERUS, BILATERAL TUBES AND OVARIES, HYSTERECTOMY:  Poorly differentiated carcinoma, 6.5 cm.  Lymphovascular involvement by tumor.  Carcinoma involves inner half of the myometrium.  Margins not involved.  Cervix, bilateral fallopian tubes and bilateral ovaries free of tumor.   B. LYMPH NODE, RIGHT EXTERNAL SENTINEL, BIOPSY:  One lymph node with no metastatic carcinoma (0/1).   C. LYMPH NODE, RIGHT PELVIC, BIOPSY  Five lymph nodes with no metastatic carcinoma (0/5).   D. LYMPH NODE, LEFT PELVIC, BIOPSY:  Five lymph nodes with no metastatic carcinoma (0/5).   E. LYMPH NODE, RIGHT PERI AORTIC, BIOPSY:  One lymph node with no metastatic carcinoma (0/1).   F. LYMPH NODE, LEFT PERI AORTIC, BIOPSY:  Five lymph nodes with no metastatic carcinoma (0/5).    ONCOLOGY TABLE:  UTERUS, CARCINOMA OR CARCINOSARCOMA   Procedure: Total hysterectomy with bilateral f-oophorectomy and sentinel  lymph nodes.  Histologic type: Poorly differentiated carcinoma, see  comment.  Histologic Grade: High-grade, FIGO 3.  Myometrial invasion:       Depth of invasion: 13 mm        Myometrial thickness: 40 mm  Uterine Serosa Involvement: Not identified  Cervical stromal involvement: Not identified  Extent of involvement of other organs: Not identified  Lymphovascular invasion: Present  Regional Lymph Nodes:       Examined:     17 Sentinel                               0 non-sentinel                               17 total        Lymph nodes with metastasis: 0        Isolated tumor cells (<0.2 mm): 0        Micrometastasis:  (>0.2 mm and < 2.0 mm): 0        Macrometastasis: (>2.0 mm): 0  Representative Tumor Block: A5, A6, A7 and A8.  MMR / MSI testing: Pending  Pathologic Stage Classification (pTNM, AJCC 8th edition):  pT1a, pN0  Comments: The carcinoma is a high-grade poorly differentiated carcinoma which morphologically has predominantly serous features.  There are a few foci with squamous differentiation and a rare focus of clear cell features.  Immunohistochemistry for cytokeratin AE1/AE3 is performed on the sentinel lymph nodes and no positivity is identified.    10/22/2019 Surgery   Pre-operative Diagnosis: endometrial cancer grade 3   Post-operative Diagnosis: same,    Operation: Robotic-assisted laparoscopic total hysterectomy for uterus >250gm with bilateral salpingoophorectomy, SLN mapping, bilateral pelvic and para-aortic lymphadenectomy.   Surgeon: Donaciano Eva    Operative Findings:  : 16cm bulky fibroid uterus, normal ovaries bilaterally, no suspicious lymph nodes.     11/10/2019 Cancer Staging   Staging form: Corpus Uteri - Carcinoma and Carcinosarcoma, AJCC 8th Edition - Pathologic: Stage IVB (pT1a, pN0, cM1) - Signed by [name], MD on 05/16/2020   05/10/2020 Imaging   1. New hypodense 2.0 cm segment 4A left liver lobe mass, suspicious for hepatic metastasis. 2. New left pelvic sidewall 1.4 cm soft tissue nodule, suspicious for left internal iliac nodal metastasis. 3. New left vaginal cuff 1.6 x 1.3 cm soft tissue nodule, suspicious  for recurrent tumor. 4. Aortic Atherosclerosis (ICD10-I70.0).   05/18/2020 Procedure   Successful placement of a right internal jugular approach power injectable Port-A-Cath. The catheter is ready for immediate use.   05/23/2020 -  Chemotherapy   The patient had carboplatin and taxol for chemotherapy treatment.       REVIEW OF SYSTEMS:   Constitutional: Denies fevers, chills or abnormal weight loss Eyes: Denies blurriness of vision Ears, nose, mouth, throat, and face: Denies mucositis or sore throat Respiratory: Denies cough, dyspnea or wheezes Cardiovascular: Denies palpitation, chest discomfort or lower extremity swelling Gastrointestinal:  Denies nausea, heartburn or change in bowel habits Skin: Denies abnormal skin rashes Lymphatics: Denies new lymphadenopathy or easy bruising Neurological:Denies numbness, tingling or new weaknesses Behavioral/Psych: Mood is stable, no new changes  All other systems were reviewed with the patient and are negative.  I have reviewed the past medical history, past surgical history, social history and family history with the patient and they are unchanged from previous note.  ALLERGIES:  is allergic to benicar hct [olmesartan medoxomil-hctz], bactrim [sulfamethoxazole-trimethoprim], pravastatin,  shellfish allergy, amlodipine, and iodinated diagnostic agents.  MEDICATIONS:  Current Outpatient Medications  Medication Sig Dispense Refill  . APPLE CIDER VINEGAR PO Take 5 mLs by mouth daily.     Marland Kitchen atorvastatin (LIPITOR) 20 MG tablet Take 1 tablet (20 mg total) by mouth daily. 90 tablet 1  . bisacodyl (DUCODYL) 5 MG EC tablet Take 5 mg by mouth daily as needed for moderate constipation.    . chlorthalidone (HYGROTON) 25 MG tablet TAKE 1/2 TABLET BY MOUTH DAILY 30 tablet 1  . dexamethasone (DECADRON) 4 MG tablet Take 2 tabs at the night before and 2 tab the morning of chemotherapy, every 3 weeks, by mouth 20 tablet 6  . lidocaine-prilocaine (EMLA) cream  Apply to affected area once 30 g 3  . Multiple Vitamin (MULTIVITAMIN) capsule Take 1 capsule by mouth daily.    . nabumetone (RELAFEN) 500 MG tablet Take 1 tablet (500 mg total) by mouth daily. (Patient not taking: Reported on 05/03/2020) 30 tablet 0  . Omega-3 Fatty Acids (FISH OIL PO) Take 500 mg by mouth every other day.    . ondansetron (ZOFRAN) 8 MG tablet Take 1 tablet (8 mg total) by mouth every 8 (eight) hours as needed. Start on the third day after chemotherapy. 30 tablet 1  . OVER THE COUNTER MEDICATION Bioqueretin daily    . oxyCODONE (OXY IR/ROXICODONE) 5 MG immediate release tablet Take 1 tablet (5 mg total) by mouth every 4 (four) hours as needed for severe pain. 30 tablet 0  . Probiotic Product (PROBIOTIC DAILY PO) Take 1 tablet by mouth every other day.    . prochlorperazine (COMPAZINE) 10 MG tablet Take 1 tablet (10 mg total) by mouth every 6 (six) hours as needed (Nausea or vomiting). 30 tablet 1  . tiZANidine (ZANAFLEX) 2 MG tablet Take 1 tablet (2 mg total) by mouth every 6 (six) hours as needed for muscle spasms. 30 tablet 0   No current facility-administered medications for this visit.    PHYSICAL EXAMINATION: ECOG PERFORMANCE STATUS: 1 - Symptomatic but completely ambulatory  Vitals:   06/13/20 1007  BP: 137/74  Pulse: 76  Resp: 18  Temp: (!) 97.5 F (36.4 C)  SpO2: 100%   Filed Weights   06/13/20 1007  Weight: 215 lb 3.2 oz (97.6 kg)    GENERAL:alert, no distress and comfortable SKIN: skin color, texture, turgor are normal, no rashes or significant lesions EYES: normal, Conjunctiva are pink and non-injected, sclera clear OROPHARYNX:no exudate, no erythema and lips, buccal mucosa, and tongue normal  NECK: supple, thyroid normal size, non-tender, without nodularity LYMPH:  no palpable lymphadenopathy in the cervical, axillary or inguinal LUNGS: clear to auscultation and percussion with normal breathing effort HEART: regular rate & rhythm and no murmurs and no  lower extremity edema ABDOMEN:abdomen soft, non-tender and normal bowel sounds Musculoskeletal:no cyanosis of digits and no clubbing  NEURO: alert & oriented x 3 with fluent speech, no focal motor/sensory deficits  LABORATORY DATA:  I have reviewed the data as listed    Component Value Date/Time   NA 139 06/13/2020 0940   NA 140 07/04/2017 0952   K 3.9 06/13/2020 0940   CL 103 06/13/2020 0940   CO2 25 06/13/2020 0940   GLUCOSE 124 (H) 06/13/2020 0940   BUN 12 06/13/2020 0940   BUN 10 07/04/2017 0952   CREATININE 0.81 06/13/2020 0940   CREATININE 0.81 03/23/2015 0946   CALCIUM 10.0 06/13/2020 0940   PROT 7.7 06/13/2020 0940   ALBUMIN  3.7 06/13/2020 0940   AST 17 06/13/2020 0940   ALT 12 06/13/2020 0940   ALKPHOS 129 (H) 06/13/2020 0940   BILITOT 0.3 06/13/2020 0940   GFRNONAA >60 06/13/2020 0940   GFRNONAA 78 03/23/2015 0946   GFRAA >60 06/13/2020 0940   GFRAA >89 03/23/2015 0946    No results found for: SPEP, UPEP  Lab Results  Component Value Date   WBC 7.0 06/13/2020   NEUTROABS 6.0 06/13/2020   HGB 11.9 (L) 06/13/2020   HCT 36.6 06/13/2020   MCV 74.7 (L) 06/13/2020   PLT 220 06/13/2020      Chemistry      Component Value Date/Time   NA 139 06/13/2020 0940   NA 140 07/04/2017 0952   K 3.9 06/13/2020 0940   CL 103 06/13/2020 0940   CO2 25 06/13/2020 0940   BUN 12 06/13/2020 0940   BUN 10 07/04/2017 0952   CREATININE 0.81 06/13/2020 0940   CREATININE 0.81 03/23/2015 0946      Component Value Date/Time   CALCIUM 10.0 06/13/2020 0940   ALKPHOS 129 (H) 06/13/2020 0940   AST 17 06/13/2020 0940   ALT 12 06/13/2020 0940   BILITOT 0.3 06/13/2020 0940       RADIOGRAPHIC STUDIES: I have personally reviewed the radiological images as listed and agreed with the findings in the report. IR IMAGING GUIDED PORT INSERTION  Result Date: 05/18/2020 INDICATION: History of endometrial cancer. In need of durable intravenous access for chemotherapy administration.  EXAM: IMPLANTED PORT A CATH PLACEMENT WITH ULTRASOUND AND FLUOROSCOPIC GUIDANCE COMPARISON:  Chest CT-10/25/2019 MEDICATIONS: Ancef 2 gm IV; The antibiotic was administered within an appropriate time interval prior to skin puncture. ANESTHESIA/SEDATION: Moderate (conscious) sedation was employed during this procedure. A total of Versed 2 mg and Fentanyl 100 mcg was administered intravenously. Moderate Sedation Time: 25 minutes. The patient's level of consciousness and vital signs were monitored continuously by radiology nursing throughout the procedure under my direct supervision. CONTRAST:  None FLUOROSCOPY TIME:  18 seconds (7 mGy) COMPLICATIONS: None immediate. PROCEDURE: The procedure, risks, benefits, and alternatives were explained to the patient. Questions regarding the procedure were encouraged and answered. The patient understands and consents to the procedure. The right neck and chest were prepped with chlorhexidine in a sterile fashion, and a sterile drape was applied covering the operative field. Maximum barrier sterile technique with sterile gowns and gloves were used for the procedure. A timeout was performed prior to the initiation of the procedure. Local anesthesia was provided with 1% lidocaine with epinephrine. After creating a small venotomy incision, a micropuncture kit was utilized to access the internal jugular vein. Real-time ultrasound guidance was utilized for vascular access including the acquisition of a permanent ultrasound image documenting patency of the accessed vessel. The microwire was utilized to measure appropriate catheter length. A subcutaneous port pocket was then created along the upper chest wall utilizing a combination of sharp and blunt dissection. The pocket was irrigated with sterile saline. A single lumen "Slim" sized power injectable port was chosen for placement. The 8 Fr catheter was tunneled from the port pocket site to the venotomy incision. The port was placed in  the pocket. The external catheter was trimmed to appropriate length. At the venotomy, an 8 Fr peel-away sheath was placed over a guidewire under fluoroscopic guidance. The catheter was then placed through the sheath and the sheath was removed. Final catheter positioning was confirmed and documented with a fluoroscopic spot radiograph. The port was accessed with a  Huber needle, aspirated and flushed with heparinized saline. The venotomy site was closed with an interrupted 4-0 Vicryl suture. The port pocket incision was closed with interrupted 2-0 Vicryl suture. The skin was opposed with a running subcuticular 4-0 Vicryl suture. Dermabond and Steri-strips were applied to both incisions. Dressings were applied. The patient tolerated the procedure well without immediate post procedural complication. FINDINGS: After catheter placement, the tip lies within the superior cavoatrial junction. The catheter aspirates and flushes normally and is ready for immediate use. IMPRESSION: Successful placement of a right internal jugular approach power injectable Port-A-Cath. The catheter is ready for immediate use. Electronically Signed   By: Sandi Mariscal M.D.   On: 05/18/2020 16:49

## 2020-06-14 NOTE — Assessment & Plan Note (Signed)
Overall, she tolerated treatment well except for some mild bone pain which resolved with conservative approach She denies other side effects so far The plan will be to continue for minimum 3 cycles of treatment before repeating imaging study She is in agreement with the plan of care

## 2020-06-20 ENCOUNTER — Encounter: Payer: Self-pay | Admitting: Family Medicine

## 2020-06-20 DIAGNOSIS — H25013 Cortical age-related cataract, bilateral: Secondary | ICD-10-CM | POA: Diagnosis not present

## 2020-06-20 DIAGNOSIS — H2513 Age-related nuclear cataract, bilateral: Secondary | ICD-10-CM | POA: Diagnosis not present

## 2020-06-20 DIAGNOSIS — H40013 Open angle with borderline findings, low risk, bilateral: Secondary | ICD-10-CM | POA: Diagnosis not present

## 2020-06-20 DIAGNOSIS — H35033 Hypertensive retinopathy, bilateral: Secondary | ICD-10-CM | POA: Diagnosis not present

## 2020-06-20 DIAGNOSIS — H524 Presbyopia: Secondary | ICD-10-CM | POA: Diagnosis not present

## 2020-06-21 LAB — HM DIABETES EYE EXAM

## 2020-06-30 ENCOUNTER — Telehealth: Payer: Self-pay | Admitting: Internal Medicine

## 2020-06-30 NOTE — Telephone Encounter (Signed)
Patient complaining of headache and dizziness when she takes chlorthalidone.  Appointment scheduled.

## 2020-06-30 NOTE — Telephone Encounter (Signed)
Pt is calling in stating that she is experiencing a reaction to Rx chlorthalidone (HYGROTON) 25 MG and would like to have a call back to discuss further.  Pt decline a appointment.

## 2020-07-01 ENCOUNTER — Encounter (HOSPITAL_COMMUNITY): Payer: Self-pay | Admitting: Gynecology

## 2020-07-01 ENCOUNTER — Telehealth: Payer: Self-pay | Admitting: *Deleted

## 2020-07-01 ENCOUNTER — Ambulatory Visit: Payer: Medicare Other | Admitting: Family Medicine

## 2020-07-01 ENCOUNTER — Ambulatory Visit (HOSPITAL_COMMUNITY)
Admission: EM | Admit: 2020-07-01 | Discharge: 2020-07-01 | Disposition: A | Discharge status: Home / Self Care | Payer: Medicare Other | Attending: Family Medicine | Admitting: Family Medicine

## 2020-07-01 ENCOUNTER — Other Ambulatory Visit: Payer: Self-pay

## 2020-07-01 DIAGNOSIS — Z79899 Other long term (current) drug therapy: Secondary | ICD-10-CM | POA: Diagnosis not present

## 2020-07-01 DIAGNOSIS — R42 Dizziness and giddiness: Secondary | ICD-10-CM | POA: Insufficient documentation

## 2020-07-01 DIAGNOSIS — I10 Essential (primary) hypertension: Secondary | ICD-10-CM | POA: Diagnosis not present

## 2020-07-01 DIAGNOSIS — G44209 Tension-type headache, unspecified, not intractable: Secondary | ICD-10-CM

## 2020-07-01 LAB — HEMOGLOBIN A1C
Hgb A1c MFr Bld: 5.6 % (ref 4.8–5.6)
Mean Plasma Glucose: 114.02 mg/dL

## 2020-07-01 MED ORDER — BLOOD PRESSURE KIT DEVI: 1.0000 [IU] | Freq: Every morning | 0 refills | Status: DC

## 2020-07-01 NOTE — Telephone Encounter (Signed)
Patient called back and I explained to her the reason she should see someone sooner as below, unfortunately none of our providers here have any openings today and advised she go to an urgent care.  Patient stated she only has headaches and dizziness when she takes Chlorthalidone.  I advised the pt she should not stop taking the medication without instructions from a provider and if she has a headache and dizziness at all she should see someone.  Patient agreed to go to an urgent care.

## 2020-07-01 NOTE — Discharge Instructions (Signed)
We will call with the hemoglobin A1c You can also check results on My Chart Drink plenty of water HOLD the garlic for a week or two Consider reducing the chlorthalidone See your doctor in 2 weeks

## 2020-07-01 NOTE — ED Triage Notes (Signed)
Per patient when taking her blood pressure with her garlic pill and vitamins she felt dizzy and with headache x 1 week.

## 2020-07-01 NOTE — ED Provider Notes (Signed)
[place]    CSN: [id number] Arrival date & time: 07/01/20  1103      History   Chief Complaint Chief Complaint  Patient presents with  . Dizziness    HPI Tina Stone is a 70 y.o. female.   HPI  Tina Stone states that she is having problems with her medication.  She has been having some lightheaded spells.  She thinks it might be from her blood pressure medication. She has been on this blood pressure medication for 2-1/2 months. She does started garlic pills 2 weeks ago. She states the headache and the lightheadedness have been present for about a week She does not take her blood pressure at home She has not had any syncope or presyncope She does not have any known heart disease, and has no palpitations, chest pain, shortness of breath, or pedal edema  She is under treatment for metastatic uterine cancer and is on chemotherapy.  She has been having a lot of changes both physically and in her medication regimen because of this Recent notes from oncology reviewed Recent laboratory work and imaging reviewed  Past Medical History:  Diagnosis Date  . Adenomatous colon polyp   . Allergic rhinitis, seasonal   . Allergy   . Beta thalassemia trait 11/25/2013  . Cholelithiasis   . Class 3 obesity without serious comorbidity with body mass index (BMI) of 40.0 to 44.9 in adult 11/19/2012  . endometrial ca dx'd 08/2009   endometrial   . GERD (gastroesophageal reflux disease)   . HLD (hyperlipidemia)   . Hypercholesterolemia   . Hypertension 03/18/2017   no meds   . Intraductal papilloma of left breast    Patient underwent left needle-localized lumpectomy by Dr. Imogene Burn. Tsuei on 09/09/2013; pathology showed intraductal papilloma with no atypia or malignancy identified.  Marland Kitchen PONV (postoperative nausea and vomiting)   . Pre-diabetes    pt denies  . Uterine fibroid     Patient Active Problem List   Diagnosis Date Noted  . Metastasis to liver ([place]) 06/14/2020  .  Bone pain 05/27/2020  . Goals of care, counseling/discussion 05/16/2020  . Endometrial cancer ([place]) 10/22/2019  . Postmenopausal bleeding 09/02/2019  . Biliary colic 90/21/1155  . Breast pain, left 05/20/2019  . Trapezius muscle spasm 05/11/2019  . Elevated random blood glucose level 02/13/2019  . Epigastric pain 01/29/2019  . Prediabetes 03/18/2017  . Hypertension 03/18/2017  . Beta thalassemia trait 11/25/2013  . Class II obesity 11/19/2012  . Women's annual routine gynecological examination 07/12/2011  . Gastroesophageal reflux disease 01/28/2007  . Hyperlipidemia 10/10/2006    Past Surgical History:  Procedure Laterality Date  . [place]   right  . BREAST EXCISIONAL BIOPSY Left 2014   benign  . BREAST LUMPECTOMY WITH NEEDLE LOCALIZATION Left 09/09/2013   Procedure: BREAST LUMPECTOMY WITH NEEDLE LOCALIZATION;  Surgeon: Imogene Burn. Georgette Dover, MD;  Location: [place];  Service: General;  Laterality: Left;  . CHOLECYSTECTOMY    . COLONOSCOPY    . IR IMAGING GUIDED PORT INSERTION  05/18/2020  . POLYPECTOMY    . ROBOTIC ASSISTED LAPAROSCOPIC CHOLECYSTECTOMY  09/09/2019  . ROBOTIC ASSISTED TOTAL HYSTERECTOMY WITH BILATERAL SALPINGO OOPHERECTOMY N/A 10/22/2019   Procedure: XI ROBOTIC ASSISTED TOTAL HYSTERECTOMY WITH BILATERAL SALPINGO OOPHORECTOMY GREATER THAN 250 GRAMS, MINI LAPARTOMY FOR SPECIMEN DELIVERY; PELVIC AND PERI-AORTIC LYMPHADENECTOMY;  Surgeon: Everitt Amber, MD;  Location: [facility];  Service: Gynecology;  Laterality: N/A;  . SENTINEL NODE BIOPSY N/A 10/22/2019  Procedure: SENTINEL NODE BIOPSY;  Surgeon: Everitt Amber, MD;  Location: [facility];  Service: Gynecology;  Laterality: N/A;    OB History    Gravida  1   Para  0   Term  0   Preterm  0   AB  1   Living  0     SAB  0   TAB  1   Ectopic  0   Multiple  0   Live Births  0            Home Medications    Prior to Admission medications   Medication Sig Start Date End Date Taking?  Authorizing Provider  APPLE CIDER VINEGAR PO Take 5 mLs by mouth daily.    Yes [provider]  bisacodyl (DUCODYL) 5 MG EC tablet Take 5 mg by mouth daily as needed for moderate constipation.   Yes [provider]  chlorthalidone (HYGROTON) 25 MG tablet TAKE 1/2 TABLET BY MOUTH DAILY 05/10/20  Yes Isaac Bliss, Rayford Halsted, MD  dexamethasone (DECADRON) 4 MG tablet Take 2 tabs at the night before and 2 tab the morning of chemotherapy, every 3 weeks, by mouth 05/16/20  Yes Gorsuch, Ni, MD  lidocaine-prilocaine (EMLA) cream Apply to affected area once 05/16/20  Yes [name], MD  Multiple Vitamin (MULTIVITAMIN) capsule Take 1 capsule by mouth daily.   Yes [provider]  Omega-3 Fatty Acids (FISH OIL PO) Take 500 mg by mouth every other day.   Yes [provider]  ondansetron (ZOFRAN) 8 MG tablet Take 1 tablet (8 mg total) by mouth every 8 (eight) hours as needed. Start on the third day after chemotherapy. 05/16/20  Yes [name], MD  Probiotic Product (PROBIOTIC DAILY PO) Take 1 tablet by mouth every other day.   Yes [provider]  prochlorperazine (COMPAZINE) 10 MG tablet Take 1 tablet (10 mg total) by mouth every 6 (six) hours as needed (Nausea or vomiting). 05/16/20  Yes Gorsuch, Ni, MD  tiZANidine (ZANAFLEX) 2 MG tablet Take 1 tablet (2 mg total) by mouth every 6 (six) hours as needed for muscle spasms. 04/21/20  Yes Faustino Congress, NP  Blood Pressure Monitoring (BLOOD PRESSURE KIT) DEVI 1 Units by Does not apply route every morning. 07/01/20   Raylene Everts, MD  atorvastatin (LIPITOR) 20 MG tablet Take 1 tablet (20 mg total) by mouth daily. 05/03/20 07/01/20  Erline Hau, MD    Family History Family History  Problem Relation Age of Onset  . Diabetes Mother   . Hypertension Mother   . Colon polyps Mother 71  . Dementia Mother 50  . Diabetes Father   . Congestive Heart Failure Father   . Pancreatic cancer Paternal Aunt   .  Colon cancer Neg Hx   . Breast cancer Neg Hx   . Lung cancer Neg Hx   . Esophageal cancer Neg Hx   . Rectal cancer Neg Hx   . Stomach cancer Neg Hx     Social History Social History   Tobacco Use  . Smoking status: Never Smoker  . Smokeless tobacco: Never Used  . Tobacco comment: few puffs but not a true smoker quit many yrs ago  Vaping Use  . Vaping Use: Never used  Substance Use Topics  . Alcohol use: Never  . Drug use: Never     Allergies   Benicar hct [olmesartan medoxomil-hctz], Bactrim [sulfamethoxazole-trimethoprim], Pravastatin, Shellfish allergy, Amlodipine, and Iodinated diagnostic agents   Review  of Systems Review of Systems See HPI  Physical Exam Triage Vital Signs ED Triage Vitals  Enc Vitals Group     BP 07/01/20 1125 (!) 142/70     Pulse Rate 07/01/20 1125 87     Resp 07/01/20 1125 16     Temp 07/01/20 1125 99 F (37.2 C)     Temp Source 07/01/20 1125 Oral     SpO2 07/01/20 1125 100 %     Weight 07/01/20 1127 216 lb (98 kg)     Height 07/01/20 1127 '5\' 3"'  (1.6 m)     Head Circumference --      Peak Flow --      Pain Score 07/01/20 1125 2     Pain Loc --      Pain Edu? --      Excl. in [place]? --    No data found.  Updated Vital Signs BP (!) 142/70 (BP Location: Right Arm)   Pulse 87   Temp 99 F (37.2 C) (Oral)   Resp 16   Ht '5\' 3"'  (1.6 m)   Wt 98 kg   SpO2 100%   BMI 38.26 kg/m      Physical Exam Constitutional:      General: She is not in acute distress.    Appearance: She is well-developed.     Comments: Pleasant.  Overweight  HENT:     Head: Normocephalic and atraumatic.     Right Ear: Tympanic membrane and ear canal normal.     Left Ear: Tympanic membrane normal.     Nose: Nose normal. No congestion.     Mouth/Throat:     Mouth: Mucous membranes are moist.     Pharynx: No posterior oropharyngeal erythema.  Eyes:     Conjunctiva/sclera: Conjunctivae normal.     Pupils: Pupils are equal, round, and reactive to light.      Comments: Disks flat.  Fundi benign  Neck:     Comments: Mild tenderness of the neck muscles and upper traps Cardiovascular:     Rate and Rhythm: Normal rate.     Heart sounds: Normal heart sounds.  Pulmonary:     Effort: Pulmonary effort is normal. No respiratory distress.     Breath sounds: Normal breath sounds.  Musculoskeletal:        General: Normal range of motion.     Cervical back: Normal range of motion and neck supple.     Right lower leg: No edema.     Left lower leg: No edema.  Lymphadenopathy:     Cervical: No cervical adenopathy.  Skin:    General: Skin is warm and dry.  Neurological:     General: No focal deficit present.     Mental Status: She is alert.     Motor: No weakness.     Coordination: Coordination normal.     Gait: Gait normal.     Deep Tendon Reflexes: Reflexes normal.  Psychiatric:        Mood and Affect: Mood normal.        Behavior: Behavior normal.      UC Treatments / Results  Labs (all labs ordered are listed, but only abnormal results are displayed) Labs Reviewed  HEMOGLOBIN A1C    EKG   Radiology No results found.  Procedures Procedures (including critical care time)  Medications Ordered in UC Medications - No data to display  Initial Impression / Assessment and Plan / UC Course  I have reviewed the triage  vital signs and the nursing notes.  Pertinent labs & imaging results that were available during my care of the patient were reviewed by me and considered in my medical decision making (see chart for details).     Discussed muscle tension headaches.  This is related to the stress of her cancer treatment.  Uncertain why she is having lightheadedness.  I think she should stay on her blood pressure medication and stop the garlic.  Discussed options with her.  She will follow up with her PCP Final Clinical Impressions(s) / UC Diagnoses   Final diagnoses:  Episodic lightheadedness  Essential hypertension  Tension headache       Discharge Instructions     We will call with the hemoglobin A1c You can also check results on My Chart Drink plenty of water HOLD the garlic for a week or two Consider reducing the chlorthalidone See your doctor in 2 weeks   ED Prescriptions    Medication Sig Dispense Auth. Provider   Blood Pressure Monitoring (BLOOD PRESSURE KIT) DEVI 1 Units by Does not apply route every morning. 1 each Raylene Everts, MD     PDMP not reviewed this encounter.   Raylene Everts, MD 07/03/20 1309

## 2020-07-01 NOTE — Telephone Encounter (Signed)
I left a detailed message for the pt to return my call as the appt for today was rescheduled for 7/13.  Due to symptoms of headache and dizziness associated with the blood pressure medication (see prior phone note) the pt should not await a visit on 7/13.

## 2020-07-02 ENCOUNTER — Ambulatory Visit (HOSPITAL_COMMUNITY)
Admission: EM | Admit: 2020-07-02 | Discharge: 2020-07-02 | Disposition: A | Discharge status: Home / Self Care | Payer: Medicare Other | Attending: Urgent Care | Admitting: Urgent Care

## 2020-07-02 ENCOUNTER — Encounter (HOSPITAL_COMMUNITY): Payer: Self-pay

## 2020-07-02 ENCOUNTER — Other Ambulatory Visit: Payer: Self-pay

## 2020-07-02 DIAGNOSIS — N390 Urinary tract infection, site not specified: Secondary | ICD-10-CM

## 2020-07-02 DIAGNOSIS — R3915 Urgency of urination: Secondary | ICD-10-CM | POA: Insufficient documentation

## 2020-07-02 DIAGNOSIS — N898 Other specified noninflammatory disorders of vagina: Secondary | ICD-10-CM | POA: Insufficient documentation

## 2020-07-02 DIAGNOSIS — C541 Malignant neoplasm of endometrium: Secondary | ICD-10-CM

## 2020-07-02 DIAGNOSIS — R350 Frequency of micturition: Secondary | ICD-10-CM

## 2020-07-02 DIAGNOSIS — R300 Dysuria: Secondary | ICD-10-CM

## 2020-07-02 LAB — POCT URINALYSIS DIP (DEVICE)
Bilirubin Urine: NEGATIVE
Glucose, UA: NEGATIVE mg/dL
Ketones, ur: NEGATIVE mg/dL
Leukocytes,Ua: NEGATIVE
Nitrite: NEGATIVE
Protein, ur: NEGATIVE mg/dL
Specific Gravity, Urine: 1.02 (ref 1.005–1.030)
Urobilinogen, UA: 0.2 mg/dL (ref 0.0–1.0)
pH: 7 (ref 5.0–8.0)

## 2020-07-02 MED ORDER — CEPHALEXIN 250 MG PO CAPS
250.0000 mg | ORAL_CAPSULE | Freq: Two times a day (BID) | ORAL | 0 refills | Status: DC
Start: 2020-07-02 — End: 2020-07-07

## 2020-07-02 NOTE — ED Provider Notes (Signed)
[place]   MRN: [id number] DOB: 10/15/1950  Subjective:   Tina Stone is a 70 y.o. female presenting for 2-week history of persistent dysuria, urinary frequency, cloudy urine, malodorous urine.  Patient hydrates with water very well.  She is currently undergoing treatment for endometrial cancer with chemotherapy.  Denies fever, nausea, vomiting, belly pain, pelvic pain, flank pain.  Patient does have clear watery discharge as well.  No current facility-administered medications for this encounter.  Current Outpatient Medications:  .  APPLE CIDER VINEGAR PO, Take 5 mLs by mouth daily. , Disp: , Rfl:  .  bisacodyl (DUCODYL) 5 MG EC tablet, Take 5 mg by mouth daily as needed for moderate constipation., Disp: , Rfl:  .  Blood Pressure Monitoring (BLOOD PRESSURE KIT) DEVI, 1 Units by Does not apply route every morning., Disp: 1 each, Rfl: 0 .  chlorthalidone (HYGROTON) 25 MG tablet, TAKE 1/2 TABLET BY MOUTH DAILY, Disp: 30 tablet, Rfl: 1 .  dexamethasone (DECADRON) 4 MG tablet, Take 2 tabs at the night before and 2 tab the morning of chemotherapy, every 3 weeks, by mouth, Disp: 20 tablet, Rfl: 6 .  lidocaine-prilocaine (EMLA) cream, Apply to affected area once, Disp: 30 g, Rfl: 3 .  Multiple Vitamin (MULTIVITAMIN) capsule, Take 1 capsule by mouth daily., Disp: , Rfl:  .  Omega-3 Fatty Acids (FISH OIL PO), Take 500 mg by mouth every other day., Disp: , Rfl:  .  ondansetron (ZOFRAN) 8 MG tablet, Take 1 tablet (8 mg total) by mouth every 8 (eight) hours as needed. Start on the third day after chemotherapy., Disp: 30 tablet, Rfl: 1 .  Probiotic Product (PROBIOTIC DAILY PO), Take 1 tablet by mouth every other day., Disp: , Rfl:  .  prochlorperazine (COMPAZINE) 10 MG tablet, Take 1 tablet (10 mg total) by mouth every 6 (six) hours as needed (Nausea or vomiting)., Disp: 30 tablet, Rfl: 1 .  tiZANidine (ZANAFLEX) 2 MG tablet, Take 1 tablet (2 mg total) by mouth every 6 (six) hours as  needed for muscle spasms., Disp: 30 tablet, Rfl: 0   Allergies  Allergen Reactions  . Benicar Hct [Olmesartan Medoxomil-Hctz] Shortness Of Breath and Palpitations  . Bactrim [Sulfamethoxazole-Trimethoprim] Other (See Comments)    Abdominal pain, dizziness  . Pravastatin Other (See Comments)    Lower abdominal pain  . Shellfish Allergy Nausea And Vomiting  . Amlodipine Palpitations  . Iodinated Diagnostic Agents Itching    Developed itching and hives after injection on 05/10/20; needs 13hr prep in future    Past Medical History:  Diagnosis Date  . Adenomatous colon polyp   . Allergic rhinitis, seasonal   . Allergy   . Beta thalassemia trait 11/25/2013  . Cholelithiasis   . Class 3 obesity without serious comorbidity with body mass index (BMI) of 40.0 to 44.9 in adult 11/19/2012  . endometrial ca dx'd 08/2009   endometrial   . GERD (gastroesophageal reflux disease)   . HLD (hyperlipidemia)   . Hypercholesterolemia   . Hypertension 03/18/2017   no meds   . Intraductal papilloma of left breast    Patient underwent left needle-localized lumpectomy by Dr. Imogene Burn. Tsuei on 09/09/2013; pathology showed intraductal papilloma with no atypia or malignancy identified.  Marland Kitchen PONV (postoperative nausea and vomiting)   . Pre-diabetes    pt denies  . Uterine fibroid      Past Surgical History:  Procedure Laterality Date  . [place]   right  .  BREAST EXCISIONAL BIOPSY Left 2014   benign  . BREAST LUMPECTOMY WITH NEEDLE LOCALIZATION Left 09/09/2013   Procedure: BREAST LUMPECTOMY WITH NEEDLE LOCALIZATION;  Surgeon: Imogene Burn. Georgette Dover, MD;  Location: [place];  Service: General;  Laterality: Left;  . CHOLECYSTECTOMY    . COLONOSCOPY    . IR IMAGING GUIDED PORT INSERTION  05/18/2020  . POLYPECTOMY    . ROBOTIC ASSISTED LAPAROSCOPIC CHOLECYSTECTOMY  09/09/2019  . ROBOTIC ASSISTED TOTAL HYSTERECTOMY WITH BILATERAL SALPINGO OOPHERECTOMY N/A 10/22/2019   Procedure: XI ROBOTIC  ASSISTED TOTAL HYSTERECTOMY WITH BILATERAL SALPINGO OOPHORECTOMY GREATER THAN 250 GRAMS, MINI LAPARTOMY FOR SPECIMEN DELIVERY; PELVIC AND PERI-AORTIC LYMPHADENECTOMY;  Surgeon: Everitt Amber, MD;  [facility];  Service: Gynecology;  Laterality: N/A;  . SENTINEL NODE BIOPSY N/A 10/22/2019   Procedure: SENTINEL NODE BIOPSY;  Surgeon: Everitt Amber, MD;  [facility];  Service: Gynecology;  Laterality: N/A;    Family History  Problem Relation Age of Onset  . Diabetes Mother   . Hypertension Mother   . Colon polyps Mother 41  . Dementia Mother 25  . Diabetes Father   . Congestive Heart Failure Father   . Pancreatic cancer Paternal Aunt   . Colon cancer Neg Hx   . Breast cancer Neg Hx   . Lung cancer Neg Hx   . Esophageal cancer Neg Hx   . Rectal cancer Neg Hx   . Stomach cancer Neg Hx     Social History   Tobacco Use  . Smoking status: Never Smoker  . Smokeless tobacco: Never Used  . Tobacco comment: few puffs but not a true smoker quit many yrs ago  Vaping Use  . Vaping Use: Never used  Substance Use Topics  . Alcohol use: Never  . Drug use: Never    ROS   Objective:   Vitals: BP (!) 141/82 (BP Location: Right Arm)   Pulse 81   Temp 98.2 F (36.8 C) (Oral)   Resp 17   SpO2 100%   Physical Exam Constitutional:      General: She is not in acute distress.    Appearance: Normal appearance. She is well-developed. She is not ill-appearing.  HENT:     Head: Normocephalic and atraumatic.     Nose: Nose normal.     Mouth/Throat:     Mouth: Mucous membranes are moist.     Pharynx: Oropharynx is clear.  Eyes:     General: No scleral icterus.    Extraocular Movements: Extraocular movements intact.     Pupils: Pupils are equal, round, and reactive to light.  Cardiovascular:     Rate and Rhythm: Normal rate.  Pulmonary:     Effort: Pulmonary effort is normal.  Abdominal:     General: Bowel sounds are normal. There is no distension.     Palpations: Abdomen is  soft. There is no mass.     Tenderness: There is no abdominal tenderness. There is no right CVA tenderness, left CVA tenderness, guarding or rebound.  Skin:    General: Skin is warm and dry.  Neurological:     General: No focal deficit present.     Mental Status: She is alert and oriented to person, place, and time.  Psychiatric:        Mood and Affect: Mood normal.        Behavior: Behavior normal.        Thought Content: Thought content normal.        Judgment: Judgment normal.  Results for orders placed or performed during the hospital encounter of 07/02/20 (from the past 24 hour(s))  POCT urinalysis dip (device)     Status: Abnormal   Collection Time: 07/02/20 10:27 AM  Result Value Ref Range   Glucose, UA NEGATIVE NEGATIVE mg/dL   Bilirubin Urine NEGATIVE NEGATIVE   Ketones, ur NEGATIVE NEGATIVE mg/dL   Specific Gravity, Urine 1.020 1.005 - 1.030   Hgb urine dipstick TRACE (A) NEGATIVE   pH 7.0 5.0 - 8.0   Protein, ur NEGATIVE NEGATIVE mg/dL   Urobilinogen, UA 0.2 0.0 - 1.0 mg/dL   Nitrite NEGATIVE NEGATIVE   Leukocytes,Ua NEGATIVE NEGATIVE    Assessment and Plan :   PDMP not reviewed this encounter.  1. Dysuria   2. Urinary frequency   3. Urinary urgency   4. Vaginal discharge   5. Endometrial cancer [facility])     Patient is immunocompromised given her endometrial cancer and chemotherapy.  Therefore will cover for acute cystitis with Keflex, labs pending.  Emphasized need to continue hydrating with plain water. Counseled patient on potential for adverse effects with medications prescribed/recommended today, ER and return-to-clinic precautions discussed, patient verbalized understanding.    Jaynee Eagles, PA-C 07/02/20 1140

## 2020-07-02 NOTE — ED Triage Notes (Signed)
Pt presents with cloudy urine, foul odor, and burning during urination X 2 weeks.

## 2020-07-03 LAB — URINE CULTURE: Culture: <10000 — AB

## 2020-07-05 LAB — CERVICOVAGINAL ANCILLARY ONLY
Bacterial Vaginitis (gardnerella): POSITIVE — AB
Candida Glabrata: NEGATIVE
Candida Vaginitis: NEGATIVE
Chlamydia: NEGATIVE
Comment: NEGATIVE
Comment: NEGATIVE
Comment: NEGATIVE
Comment: NEGATIVE
Comment: NEGATIVE
Comment: NORMAL
Neisseria Gonorrhea: NEGATIVE
Trichomonas: NEGATIVE

## 2020-07-05 MED FILL — DEXAMETHASONE 4 MG TABLET: 4 | 21 days supply | Qty: 4 | Fill #2

## 2020-07-06 ENCOUNTER — Telehealth: Payer: Self-pay | Admitting: Hematology and Oncology

## 2020-07-06 ENCOUNTER — Telehealth: Payer: Self-pay | Admitting: Emergency Medicine

## 2020-07-06 MED ORDER — METRONIDAZOLE 500 MG PO TABS: 500.0000 mg | ORAL_TABLET | Freq: Two times a day (BID) | ORAL | 0 refills | Status: DC

## 2020-07-06 MED FILL — metroNIDAZOLE 500 MG TABS: 500 | 7 days supply | Qty: 14 | Fill #0

## 2020-07-06 NOTE — Telephone Encounter (Signed)
Called patient to review recent swab.  Verified identity using two identifiers and provided positive BV result.  Informed patient of need to treat with Metronidazole 500mg  BID x 7 days.  Patient to finish antibiotic for UTI today.  States she will start Flagyl next week.

## 2020-07-06 NOTE — Telephone Encounter (Signed)
Called pt per 7/6 sch msg - pt request to r/s - unable to reach pt - left message for patient to call back to reschedule to 7/14 - nothing available for 7/12.. Per Message below :   :: Message Received: Darlyn Chamber, MD  Nicholaus Corolla I am overbooked on 7/12  I can see her and Rx on 7/14       Previous Messages   ----- Message -----  From: Nicholaus Corolla  Sent: 07/05/2020  3:22 PM EDT  To: [name], MD, Nicholaus Corolla   Scheduling Message  Entered by Guy Sandifer on 07/05/2020 at 3:10 PM  Priority: High  <No visit type provided>  Department: [facility]  Provider: [facility]  Scheduling Notes:  reschedule appts on 07/07/20. Would like to move them to 07/11/20 if available   Hi Dr. Alvy Bimler - are you okay with patients chemo beign rescheduled to the following week ?

## 2020-07-07 ENCOUNTER — Inpatient Hospital Stay: Payer: Medicare Other

## 2020-07-07 ENCOUNTER — Other Ambulatory Visit: Payer: Self-pay

## 2020-07-07 ENCOUNTER — Inpatient Hospital Stay: Payer: Medicare Other | Attending: Gynecologic Oncology

## 2020-07-07 ENCOUNTER — Inpatient Hospital Stay (HOSPITAL_BASED_OUTPATIENT_CLINIC_OR_DEPARTMENT_OTHER): Payer: Medicare Other | Admitting: Hematology and Oncology

## 2020-07-07 ENCOUNTER — Encounter: Payer: Self-pay | Admitting: Hematology and Oncology

## 2020-07-07 VITALS — BP 142/87 | HR 94 | Temp 97.6°F | Resp 18 | Ht 63.0 in | Wt 212.8 lb

## 2020-07-07 DIAGNOSIS — C541 Malignant neoplasm of endometrium: Secondary | ICD-10-CM

## 2020-07-07 DIAGNOSIS — Z7189 Other specified counseling: Secondary | ICD-10-CM

## 2020-07-07 DIAGNOSIS — N760 Acute vaginitis: Secondary | ICD-10-CM | POA: Diagnosis not present

## 2020-07-07 DIAGNOSIS — C787 Secondary malignant neoplasm of liver and intrahepatic bile duct: Secondary | ICD-10-CM | POA: Insufficient documentation

## 2020-07-07 DIAGNOSIS — Z5111 Encounter for antineoplastic chemotherapy: Secondary | ICD-10-CM | POA: Diagnosis not present

## 2020-07-07 DIAGNOSIS — Z79899 Other long term (current) drug therapy: Secondary | ICD-10-CM | POA: Insufficient documentation

## 2020-07-07 DIAGNOSIS — B9689 Other specified bacterial agents as the cause of diseases classified elsewhere: Secondary | ICD-10-CM | POA: Diagnosis not present

## 2020-07-07 LAB — CBC WITH DIFFERENTIAL (CANCER CENTER ONLY)
Abs Immature Granulocytes: 0.06 10*3/uL (ref 0.00–0.07)
Basophils Absolute: 0 10*3/uL (ref 0.0–0.1)
Basophils Relative: 0 %
Eosinophils Absolute: 0 10*3/uL (ref 0.0–0.5)
Eosinophils Relative: 0 %
HCT: 35.9 % — ABNORMAL LOW (ref 36.0–46.0)
Hemoglobin: 11.7 g/dL — ABNORMAL LOW (ref 12.0–15.0)
Immature Granulocytes: 1 %
Lymphocytes Relative: 14 %
Lymphs Abs: 0.8 10*3/uL (ref 0.7–4.0)
MCH: 24.9 pg — ABNORMAL LOW (ref 26.0–34.0)
MCHC: 32.6 g/dL (ref 30.0–36.0)
MCV: 76.5 fL — ABNORMAL LOW (ref 80.0–100.0)
Monocytes Absolute: 0.2 10*3/uL (ref 0.1–1.0)
Monocytes Relative: 3 %
Neutro Abs: 5 10*3/uL (ref 1.7–7.7)
Neutrophils Relative %: 82 %
Platelet Count: 212 10*3/uL (ref 150–400)
RBC: 4.69 MIL/uL (ref 3.87–5.11)
RDW: 18.9 % — ABNORMAL HIGH (ref 11.5–15.5)
WBC Count: 6 10*3/uL (ref 4.0–10.5)
nRBC: 0 % (ref 0.0–0.2)

## 2020-07-07 LAB — CMP (CANCER CENTER ONLY)
ALT: 12 U/L (ref 0–44)
AST: 15 U/L (ref 15–41)
Albumin: 3.6 g/dL (ref 3.5–5.0)
Alkaline Phosphatase: 103 U/L (ref 38–126)
Anion gap: 12 (ref 5–15)
BUN: 10 mg/dL (ref 8–23)
CO2: 24 mmol/L (ref 22–32)
Calcium: 9.8 mg/dL (ref 8.9–10.3)
Chloride: 103 mmol/L (ref 98–111)
Creatinine: 0.85 mg/dL (ref 0.44–1.00)
GFR, Est AFR Am: >60 mL/min (ref >60)
GFR, Estimated: >60 mL/min (ref >60)
Glucose, Bld: 152 mg/dL — ABNORMAL HIGH (ref 70–99)
Potassium: 3.3 mmol/L — ABNORMAL LOW (ref 3.5–5.1)
Sodium: 139 mmol/L (ref 135–145)
Total Bilirubin: 0.4 mg/dL (ref 0.3–1.2)
Total Protein: 7.5 g/dL (ref 6.5–8.1)

## 2020-07-07 MED ORDER — SODIUM CHLORIDE 0.9 % IV SOLN
10.0000 mg | Freq: Once | INTRAVENOUS | Status: AC
  Administered 2020-07-07: 10 mg via INTRAVENOUS
  Filled 2020-07-07: qty 10

## 2020-07-07 MED ORDER — SODIUM CHLORIDE 0.9% FLUSH
10.0000 mL | Freq: Once | INTRAVENOUS | Status: AC
  Administered 2020-07-07: 10 mL
  Filled 2020-07-07: qty 10

## 2020-07-07 MED ORDER — DIPHENHYDRAMINE HCL 50 MG/ML IJ SOLN
INTRAMUSCULAR | Status: AC
  Filled 2020-07-07: qty 1

## 2020-07-07 MED ORDER — DIPHENHYDRAMINE HCL 50 MG/ML IJ SOLN
50.0000 mg | Freq: Once | INTRAMUSCULAR | Status: AC
  Administered 2020-07-07: 50 mg via INTRAVENOUS

## 2020-07-07 MED ORDER — SODIUM CHLORIDE 0.9 % IV SOLN
514.8000 mg | Freq: Once | INTRAVENOUS | Status: AC
  Administered 2020-07-07: 510 mg via INTRAVENOUS
  Filled 2020-07-07: qty 51

## 2020-07-07 MED ORDER — FAMOTIDINE IN NACL 20-0.9 MG/50ML-% IV SOLN
20.0000 mg | Freq: Once | INTRAVENOUS | Status: AC
  Administered 2020-07-07: 20 mg via INTRAVENOUS

## 2020-07-07 MED ORDER — SODIUM CHLORIDE 0.9 % IV SOLN
175.0000 mg/m2 | Freq: Once | INTRAVENOUS | Status: AC
  Administered 2020-07-07: 312 mg via INTRAVENOUS
  Filled 2020-07-07: qty 52

## 2020-07-07 MED ORDER — PALONOSETRON HCL INJECTION 0.25 MG/5ML
INTRAVENOUS | Status: AC
  Filled 2020-07-07: qty 5

## 2020-07-07 MED ORDER — SODIUM CHLORIDE 0.9 % IV SOLN
150.0000 mg | Freq: Once | INTRAVENOUS | Status: AC
  Administered 2020-07-07: 150 mg via INTRAVENOUS
  Filled 2020-07-07: qty 150

## 2020-07-07 MED ORDER — SODIUM CHLORIDE 0.9% FLUSH
10.0000 mL | INTRAVENOUS | Status: DC | PRN
  Administered 2020-07-07: 10 mL
  Filled 2020-07-07: qty 10

## 2020-07-07 MED ORDER — FAMOTIDINE IN NACL 20-0.9 MG/50ML-% IV SOLN
INTRAVENOUS | Status: AC
  Filled 2020-07-07: qty 50

## 2020-07-07 MED ORDER — PALONOSETRON HCL INJECTION 0.25 MG/5ML
0.2500 mg | Freq: Once | INTRAVENOUS | Status: AC
  Administered 2020-07-07: 0.25 mg via INTRAVENOUS

## 2020-07-07 MED ORDER — HEPARIN SOD (PORK) LOCK FLUSH 100 UNIT/ML IV SOLN
500.0000 [IU] | Freq: Once | INTRAVENOUS | Status: AC | PRN
  Administered 2020-07-07: 500 [IU]
  Filled 2020-07-07: qty 5

## 2020-07-07 MED ORDER — SODIUM CHLORIDE 0.9 % IV SOLN
Freq: Once | INTRAVENOUS | Status: AC
  Filled 2020-07-07: qty 250

## 2020-07-07 NOTE — Progress Notes (Signed)
MD using adjusted BSA of 1.77 for Taxol dosing.  Larene Beach, PharmD

## 2020-07-07 NOTE — Progress Notes (Signed)
[facility] OFFICE PROGRESS NOTE  Patient Care Team: Isaac Bliss, Rayford Halsted, MD as PCP - General (Internal Medicine) Axel Filler, MD  ASSESSMENT & PLAN:  Endometrial cancer [facility]) Overall, she tolerated treatment well except for some mild bone pain which resolved with conservative approach She denies other side effects so far We will proceed with treatment today and plan for CT imaging at the end of the month  Metastasis to liver [facility]) Her liver enzymes are within normal limits We will continue treatment without delay   Bacterial vaginosis I gave the patient instructions to start her treatment with metronidazole I warned her about side effects We will still proceed with treatment without delay   Orders Placed This Encounter  Procedures   CT ABDOMEN PELVIS W CONTRAST    Standing Status:   Future    Standing Expiration Date:   07/07/2021    Order Specific Question:   If indicated for the ordered procedure, I authorize the administration of contrast media per Radiology protocol    Answer:   Yes    Order Specific Question:   Preferred imaging location?    Answer:   [facility]    Order Specific Question:   Radiology Contrast Protocol - do NOT remove file path    Answer:   \[email or web address]   CT CHEST W CONTRAST    Standing Status:   Future    Standing Expiration Date:   07/07/2021    Order Specific Question:   If indicated for the ordered procedure, I authorize the administration of contrast media per Radiology protocol    Answer:   Yes    Order Specific Question:   Preferred imaging location?    Answer:   [facility]    Order Specific Question:   Radiology Contrast Protocol - do NOT remove file path    Answer:   \[email or web address]    All questions were answered. The patient knows to call the clinic with any problems, questions or concerns. The total time spent in the appointment  was 20 minutes encounter with patients including review of chart and various tests results, discussions about plan of care and coordination of care plan   [name], MD 07/07/2020 10:38 AM  INTERVAL HISTORY: Please see below for problem oriented charting. She is seen prior to cycle 3 of treatment She went to the emergency department recently, at first she was told she might have UTI but subsequently received a telephone call and was instructed to start metronidazole for bacterial vaginosis She has not started metronidazole She has intermittent clear vaginal discharge She tolerated recent chemotherapy well No nausea or changes in bowel habits No peripheral neuropathy  SUMMARY OF ONCOLOGIC HISTORY: Oncology History Overview Note  Poorly differentiated carcinoma, mixed histology with squamous differentiation, rare focus of clear cells as well as serous features MSI stable Her2 negative   Endometrial cancer ([place])  09/01/2019 Initial Diagnosis   The patient reported a history of postmenopausal bleeding that began 1 to 2 months before diagnosis   09/14/2019 Imaging   US pelvis 1. Enlarged uterus with numerous myometrial masses, presumably fibroids. 2. Endometrial thickness of 6.2 mm. In the setting of post-menopausal bleeding, endometrial sampling is indicated to exclude carcinoma. If results are benign, sonohysterogram should be considered for focal lesion work-up.  3. Nonvisualized ovaries   09/23/2019 Pathology Results   A. ENDOMETRIUM, BIOPSY:  - Poorly differentiated carcinoma   10/15/2019 Imaging   Ct scan of chest, abdomen and  pelvis: No evidence of metastatic disease within the chest, abdomen, or pelvis.   Enlarged fibroid uterus.   Colonic diverticulosis. No radiographic evidence of diverticulitis.   Aortic and coronary artery atherosclerosis.     10/22/2019 Pathology Results   SURGICAL PATHOLOGY   FINAL MICROSCOPIC DIAGNOSIS:   A. UTERUS, BILATERAL TUBES AND OVARIES,  HYSTERECTOMY:  Poorly differentiated carcinoma, 6.5 cm.  Lymphovascular involvement by tumor.  Carcinoma involves inner half of the myometrium.  Margins not involved.  Cervix, bilateral fallopian tubes and bilateral ovaries free of tumor.   B. LYMPH NODE, RIGHT EXTERNAL SENTINEL, BIOPSY:  One lymph node with no metastatic carcinoma (0/1).   C. LYMPH NODE, RIGHT PELVIC, BIOPSY  Five lymph nodes with no metastatic carcinoma (0/5).   D. LYMPH NODE, LEFT PELVIC, BIOPSY:  Five lymph nodes with no metastatic carcinoma (0/5).   E. LYMPH NODE, RIGHT PERI AORTIC, BIOPSY:  One lymph node with no metastatic carcinoma (0/1).   F. LYMPH NODE, LEFT PERI AORTIC, BIOPSY:  Five lymph nodes with no metastatic carcinoma (0/5).    ONCOLOGY TABLE:  UTERUS, CARCINOMA OR CARCINOSARCOMA   Procedure: Total hysterectomy with bilateral f-oophorectomy and sentinel  lymph nodes.  Histologic type: Poorly differentiated carcinoma, see comment.  Histologic Grade: High-grade, FIGO 3.  Myometrial invasion:       Depth of invasion: 13 mm       Myometrial thickness: 40 mm  Uterine Serosa Involvement: Not identified  Cervical stromal involvement: Not identified  Extent of involvement of other organs: Not identified  Lymphovascular invasion: Present  Regional Lymph Nodes:       Examined:     17 Sentinel                               0 non-sentinel                               17 total        Lymph nodes with metastasis: 0        Isolated tumor cells (<0.2 mm): 0        Micrometastasis:  (>0.2 mm and < 2.0 mm): 0        Macrometastasis: (>2.0 mm): 0  Representative Tumor Block: A5, A6, A7 and A8.  MMR / MSI testing: Pending  Pathologic Stage Classification (pTNM, AJCC 8th edition):  pT1a, pN0  Comments: The carcinoma is a high-grade poorly differentiated carcinoma which morphologically has predominantly serous features.  There are a few foci with squamous differentiation and a rare focus of clear cell  features.  Immunohistochemistry for cytokeratin AE1/AE3 is performed on the sentinel lymph nodes and no positivity is identified.    10/22/2019 Surgery   Pre-operative Diagnosis: endometrial cancer grade 3   Post-operative Diagnosis: same,    Operation: Robotic-assisted laparoscopic total hysterectomy for uterus >250gm with bilateral salpingoophorectomy, SLN mapping, bilateral pelvic and para-aortic lymphadenectomy.   Surgeon: Donaciano Eva    Operative Findings:  : 16cm bulky fibroid uterus, normal ovaries bilaterally, no suspicious lymph nodes.     11/10/2019 Cancer Staging   Staging form: Corpus Uteri - Carcinoma and Carcinosarcoma, AJCC 8th Edition - Pathologic: Stage IVB (pT1a, pN0, cM1) - Signed by [name], MD on 05/16/2020   05/10/2020 Imaging   1. New hypodense 2.0 cm segment 4A left liver lobe mass, suspicious for hepatic metastasis. 2. New left pelvic  sidewall 1.4 cm soft tissue nodule, suspicious for left internal iliac nodal metastasis. 3. New left vaginal cuff 1.6 x 1.3 cm soft tissue nodule, suspicious for recurrent tumor. 4. Aortic Atherosclerosis (ICD10-I70.0).   05/18/2020 Procedure   Successful placement of a right internal jugular approach power injectable Port-A-Cath. The catheter is ready for immediate use.   05/23/2020 -  Chemotherapy   The patient had carboplatin and taxol for chemotherapy treatment.       REVIEW OF SYSTEMS:   Constitutional: Denies fevers, chills or abnormal weight loss Eyes: Denies blurriness of vision Ears, nose, mouth, throat, and face: Denies mucositis or sore throat Respiratory: Denies cough, dyspnea or wheezes Cardiovascular: Denies palpitation, chest discomfort or lower extremity swelling Gastrointestinal:  Denies nausea, heartburn or change in bowel habits Skin: Denies abnormal skin rashes Lymphatics: Denies new lymphadenopathy or easy bruising Neurological:Denies numbness, tingling or new weaknesses Behavioral/Psych:  Mood is stable, no new changes  All other systems were reviewed with the patient and are negative.  I have reviewed the past medical history, past surgical history, social history and family history with the patient and they are unchanged from previous note.  ALLERGIES:  is allergic to benicar hct [olmesartan medoxomil-hctz], bactrim [sulfamethoxazole-trimethoprim], pravastatin, shellfish allergy, amlodipine, and iodinated diagnostic agents.  MEDICATIONS:  Current Outpatient Medications  Medication Sig Dispense Refill   APPLE CIDER VINEGAR PO Take 5 mLs by mouth daily.      bisacodyl (DUCODYL) 5 MG EC tablet Take 5 mg by mouth daily as needed for moderate constipation.     Blood Pressure Monitoring (BLOOD PRESSURE KIT) DEVI 1 Units by Does not apply route every morning. 1 each 0   chlorthalidone (HYGROTON) 25 MG tablet TAKE 1/2 TABLET BY MOUTH DAILY 30 tablet 1   dexamethasone (DECADRON) 4 MG tablet Take 2 tabs at the night before and 2 tab the morning of chemotherapy, every 3 weeks, by mouth 20 tablet 6   lidocaine-prilocaine (EMLA) cream Apply to affected area once 30 g 3   metroNIDAZOLE (FLAGYL) 500 MG tablet Take 1 tablet (500 mg total) by mouth 2 (two) times daily. 14 tablet 0   Multiple Vitamin (MULTIVITAMIN) capsule Take 1 capsule by mouth daily.     Omega-3 Fatty Acids (FISH OIL PO) Take 500 mg by mouth every other day.     ondansetron (ZOFRAN) 8 MG tablet Take 1 tablet (8 mg total) by mouth every 8 (eight) hours as needed. Start on the third day after chemotherapy. 30 tablet 1   Probiotic Product (PROBIOTIC DAILY PO) Take 1 tablet by mouth every other day.     prochlorperazine (COMPAZINE) 10 MG tablet Take 1 tablet (10 mg total) by mouth every 6 (six) hours as needed (Nausea or vomiting). 30 tablet 1   tiZANidine (ZANAFLEX) 2 MG tablet Take 1 tablet (2 mg total) by mouth every 6 (six) hours as needed for muscle spasms. 30 tablet 0   No current facility-administered  medications for this visit.   Facility-Administered Medications Ordered in Other Visits  Medication Dose Route Frequency Provider Last Rate Last Admin   CARBOplatin (PARAPLATIN) 510 mg in sodium chloride 0.9 % 250 mL chemo infusion  510 mg Intravenous Once Alvy Bimler, Calven Gilkes, MD       dexamethasone (DECADRON) 10 mg in sodium chloride 0.9 % 50 mL IVPB  10 mg Intravenous Once Alvy Bimler, Karol Liendo, MD 204 mL/hr at 07/07/20 1024 10 mg at 07/07/20 1024   fosaprepitant (EMEND) 150 mg in sodium chloride 0.9 % 145 mL  IVPB  150 mg Intravenous Once Alvy Bimler, Orvel Cutsforth, MD       heparin lock flush 100 unit/mL  500 Units Intracatheter Once PRN Alvy Bimler, Nataliya Graig, MD       PACLitaxel (TAXOL) 312 mg in sodium chloride 0.9 % 500 mL chemo infusion (> 32m/m2)  175 mg/m2 (Treatment Plan Adjusted) Intravenous Once GAlvy Bimler Lilly Gasser, MD       sodium chloride flush (NS) 0.9 % injection 10 mL  10 mL Intracatheter PRN GAlvy Bimler Maven Varelas, MD        PHYSICAL EXAMINATION: ECOG PERFORMANCE STATUS: 1 - Symptomatic but completely ambulatory  Vitals:   07/07/20 0916  BP: (!) 142/87  Pulse: 94  Resp: 18  Temp: 97.6 F (36.4 C)  SpO2: 100%   Filed Weights   07/07/20 0916  Weight: 212 lb 12.8 oz (96.5 kg)    GENERAL:alert, no distress and comfortable NEURO: alert & oriented x 3 with fluent speech, no focal motor/sensory deficits  LABORATORY DATA:  I have reviewed the data as listed    Component Value Date/Time   NA 139 07/07/2020 0845   NA 140 07/04/2017 0952   K 3.3 (L) 07/07/2020 0845   CL 103 07/07/2020 0845   CO2 24 07/07/2020 0845   GLUCOSE 152 (H) 07/07/2020 0845   BUN 10 07/07/2020 0845   BUN 10 07/04/2017 0952   CREATININE 0.85 07/07/2020 0845   CREATININE 0.81 03/23/2015 0946   CALCIUM 9.8 07/07/2020 0845   PROT 7.5 07/07/2020 0845   ALBUMIN 3.6 07/07/2020 0845   AST 15 07/07/2020 0845   ALT 12 07/07/2020 0845   ALKPHOS 103 07/07/2020 0845   BILITOT 0.4 07/07/2020 0845   GFRNONAA >60 07/07/2020 0845   GFRNONAA 78  03/23/2015 0946   GFRAA >60 07/07/2020 0845   GFRAA >89 03/23/2015 0946    No results found for: SPEP, UPEP  Lab Results  Component Value Date   WBC 6.0 07/07/2020   NEUTROABS 5.0 07/07/2020   HGB 11.7 (L) 07/07/2020   HCT 35.9 (L) 07/07/2020   MCV 76.5 (L) 07/07/2020   PLT 212 07/07/2020      Chemistry      Component Value Date/Time   NA 139 07/07/2020 0845   NA 140 07/04/2017 0952   K 3.3 (L) 07/07/2020 0845   CL 103 07/07/2020 0845   CO2 24 07/07/2020 0845   BUN 10 07/07/2020 0845   BUN 10 07/04/2017 0952   CREATININE 0.85 07/07/2020 0845   CREATININE 0.81 03/23/2015 0946      Component Value Date/Time   CALCIUM 9.8 07/07/2020 0845   ALKPHOS 103 07/07/2020 0845   AST 15 07/07/2020 0845   ALT 12 07/07/2020 0845   BILITOT 0.4 07/07/2020 0845

## 2020-07-07 NOTE — Patient Instructions (Signed)

## 2020-07-07 NOTE — Assessment & Plan Note (Signed)
I gave the patient instructions to start her treatment with metronidazole I warned her about side effects We will still proceed with treatment without delay

## 2020-07-07 NOTE — Assessment & Plan Note (Signed)
Her liver enzymes are within normal limits We will continue treatment without delay

## 2020-07-07 NOTE — Progress Notes (Addendum)
MD using adjusted body weight for Carboplatin dosing and AUC= 6.   Larene Beach, PharmD

## 2020-07-07 NOTE — Assessment & Plan Note (Signed)
Overall, she tolerated treatment well except for some mild bone pain which resolved with conservative approach She denies other side effects so far We will proceed with treatment today and plan for CT imaging at the end of the month

## 2020-07-07 NOTE — Patient Instructions (Signed)
[facility] Discharge Instructions for Patients Receiving Chemotherapy  Today you received the following chemotherapy agents Taxol, Carboplatin  To help prevent nausea and vomiting after your treatment, we encourage you to take your nausea medication as directed  If you develop nausea and vomiting that is not controlled by your nausea medication, call the clinic.   BELOW ARE SYMPTOMS THAT SHOULD BE REPORTED IMMEDIATELY:  *FEVER GREATER THAN 100.5 F  *CHILLS WITH OR WITHOUT FEVER  NAUSEA AND VOMITING THAT IS NOT CONTROLLED WITH YOUR NAUSEA MEDICATION  *UNUSUAL SHORTNESS OF BREATH  *UNUSUAL BRUISING OR BLEEDING  TENDERNESS IN MOUTH AND THROAT WITH OR WITHOUT PRESENCE OF ULCERS  *URINARY PROBLEMS  *BOWEL PROBLEMS  UNUSUAL RASH Items with * indicate a potential emergency and should be followed up as soon as possible.  Feel free to call the clinic should you have any questions or concerns. The clinic phone number is [phone].  Please show the CHEMO ALERT CARD at check-in to the Emergency Department and triage nurse.   

## 2020-07-07 NOTE — Progress Notes (Signed)
Per T.J, RPh ok to use dose 312mg  for taxol (outside of 10%).

## 2020-07-08 ENCOUNTER — Telehealth: Payer: Self-pay

## 2020-07-08 ENCOUNTER — Other Ambulatory Visit: Payer: Self-pay

## 2020-07-08 MED ORDER — PREDNISONE 50 MG PO TABS
ORAL_TABLET | ORAL | 0 refills | Status: DC
Start: 2020-07-08 — End: 2020-08-17

## 2020-07-08 MED FILL — predniSONE 50 MG TABS: 50 | 1 days supply | Qty: 3 | Fill #0

## 2020-07-08 NOTE — Telephone Encounter (Signed)
Called and left a message asking her to call the office back with the pharmacy she would like Prednisone Rx sent to. Instructed to take Prednisone 50 mg one at 13 hours prior, 7 hours prior and 1 hour prior to CT scan and take Benadryl 50 mg 1 hour prior to CT scan. Ask her to call the office back.

## 2020-07-08 NOTE — Telephone Encounter (Signed)
-----   Message from [name], MD sent at 07/08/2020 11:07 AM EDT ----- Regarding: RE: allergy Please do and give her special instructions For some reason it did not trigger the smart set yesterday ----- Message ----- From: Flo Shanks, RN Sent: 07/08/2020  10:52 AM EDT To: [name], MD Subject: allergy                                        She called and left a message. She has contrast allergy. Scheduled for CT on 7/29. Requesting Prednisone Rx. Okay to send?

## 2020-07-08 NOTE — Telephone Encounter (Signed)
She called back and verbalized understanding to below message. Rx sent to her requested pharmacy.

## 2020-07-09 ENCOUNTER — Other Ambulatory Visit: Payer: Self-pay

## 2020-07-09 ENCOUNTER — Emergency Department (HOSPITAL_COMMUNITY)
Admission: EM | Admit: 2020-07-09 | Discharge: 2020-07-09 | Disposition: A | Discharge status: Home / Self Care | Payer: Medicare Other | Attending: Emergency Medicine | Admitting: Emergency Medicine

## 2020-07-09 ENCOUNTER — Encounter (HOSPITAL_COMMUNITY): Payer: Self-pay | Admitting: Emergency Medicine

## 2020-07-09 ENCOUNTER — Emergency Department (HOSPITAL_COMMUNITY): Payer: Medicare Other

## 2020-07-09 DIAGNOSIS — R7303 Prediabetes: Secondary | ICD-10-CM | POA: Insufficient documentation

## 2020-07-09 DIAGNOSIS — R55 Syncope and collapse: Secondary | ICD-10-CM | POA: Diagnosis not present

## 2020-07-09 DIAGNOSIS — I10 Essential (primary) hypertension: Secondary | ICD-10-CM | POA: Insufficient documentation

## 2020-07-09 DIAGNOSIS — Z8505 Personal history of malignant neoplasm of liver: Secondary | ICD-10-CM | POA: Insufficient documentation

## 2020-07-09 DIAGNOSIS — R42 Dizziness and giddiness: Secondary | ICD-10-CM | POA: Diagnosis not present

## 2020-07-09 LAB — URINALYSIS, ROUTINE W REFLEX MICROSCOPIC
Bacteria, UA: NONE SEEN
Bilirubin Urine: NEGATIVE
Glucose, UA: NEGATIVE mg/dL
Ketones, ur: NEGATIVE mg/dL
Leukocytes,Ua: NEGATIVE
Nitrite: NEGATIVE
Protein, ur: NEGATIVE mg/dL
Specific Gravity, Urine: 1.003 — ABNORMAL LOW (ref 1.005–1.030)
pH: 7 (ref 5.0–8.0)

## 2020-07-09 LAB — CBC WITH DIFFERENTIAL/PLATELET
Abs Immature Granulocytes: 0.02 10*3/uL (ref 0.00–0.07)
Basophils Absolute: 0 10*3/uL (ref 0.0–0.1)
Basophils Relative: 0 %
Eosinophils Absolute: 0 10*3/uL (ref 0.0–0.5)
Eosinophils Relative: 1 %
HCT: 34.4 % — ABNORMAL LOW (ref 36.0–46.0)
Hemoglobin: 11 g/dL — ABNORMAL LOW (ref 12.0–15.0)
Immature Granulocytes: 1 %
Lymphocytes Relative: 27 %
Lymphs Abs: 0.8 10*3/uL (ref 0.7–4.0)
MCH: 24.8 pg — ABNORMAL LOW (ref 26.0–34.0)
MCHC: 32 g/dL (ref 30.0–36.0)
MCV: 77.5 fL — ABNORMAL LOW (ref 80.0–100.0)
Monocytes Absolute: 0.1 10*3/uL (ref 0.1–1.0)
Monocytes Relative: 4 %
Neutro Abs: 2.1 10*3/uL (ref 1.7–7.7)
Neutrophils Relative %: 67 %
Platelets: 179 10*3/uL (ref 150–400)
RBC: 4.44 MIL/uL (ref 3.87–5.11)
RDW: 18.9 % — ABNORMAL HIGH (ref 11.5–15.5)
WBC: 3.1 10*3/uL — ABNORMAL LOW (ref 4.0–10.5)
nRBC: 0 % (ref 0.0–0.2)

## 2020-07-09 LAB — COMPREHENSIVE METABOLIC PANEL
ALT: 15 U/L (ref 0–44)
AST: 23 U/L (ref 15–41)
Albumin: 3.5 g/dL (ref 3.5–5.0)
Alkaline Phosphatase: 79 U/L (ref 38–126)
Anion gap: 9 (ref 5–15)
BUN: 14 mg/dL (ref 8–23)
CO2: 28 mmol/L (ref 22–32)
Calcium: 8.7 mg/dL — ABNORMAL LOW (ref 8.9–10.3)
Chloride: 100 mmol/L (ref 98–111)
Creatinine, Ser: 0.77 mg/dL (ref 0.44–1.00)
GFR calc Af Amer: >60 mL/min (ref >60)
GFR calc non Af Amer: >60 mL/min (ref >60)
Glucose, Bld: 107 mg/dL — ABNORMAL HIGH (ref 70–99)
Potassium: 3.5 mmol/L (ref 3.5–5.1)
Sodium: 137 mmol/L (ref 135–145)
Total Bilirubin: 1 mg/dL (ref 0.3–1.2)
Total Protein: 6.9 g/dL (ref 6.5–8.1)

## 2020-07-09 LAB — LIPASE, BLOOD: Lipase: 34 U/L (ref 11–51)

## 2020-07-09 MED ORDER — HEPARIN SOD (PORK) LOCK FLUSH 100 UNIT/ML IV SOLN
500.0000 [IU] | Freq: Once | INTRAVENOUS | Status: AC
  Administered 2020-07-09: 500 [IU]
  Filled 2020-07-09: qty 5

## 2020-07-09 MED ORDER — SODIUM CHLORIDE 0.9 % IV BOLUS
500.0000 mL | Freq: Once | INTRAVENOUS | Status: AC
  Administered 2020-07-09: 500 mL via INTRAVENOUS

## 2020-07-09 NOTE — ED Provider Notes (Signed)
[facility] DEPT Provider Note   CSN: [id number] Arrival date & time: 07/09/20  1234     History Chief Complaint  Patient presents with  . Dizziness  . Abdominal Pain  . Hand Pain  . neck stiffness    Tina Stone is a 70 y.o. female.  HPI    Patient presents with concern of lightheadedness/dizziness. Patient has multiple medical issues including ongoing cancer therapy.  She also also receiving Flagyl for bacterial vaginosis. She notes her last chemotherapy was 2 days ago and about that time she was started on her Flagyl therapy. Over the interval day or so the patient has had persistent lightheadedness, described as near syncope, not vertigo. No new focal weakness, no actual syncope, no chest pain, no dyspnea, no fever, no chills, no new pain anywhere. Beyond temporal concurrence with initiation of Flagyl, no recent medication change or possible alleviating, exacerbating factors. Past Medical History:  Diagnosis Date  . Adenomatous colon polyp   . Allergic rhinitis, seasonal   . Allergy   . Beta thalassemia trait 11/25/2013  . Cholelithiasis   . Class 3 obesity without serious comorbidity with body mass index (BMI) of 40.0 to 44.9 in adult 11/19/2012  . endometrial ca dx'd 08/2009   endometrial   . GERD (gastroesophageal reflux disease)   . HLD (hyperlipidemia)   . Hypercholesterolemia   . Hypertension 03/18/2017   no meds   . Intraductal papilloma of left breast    Patient underwent left needle-localized lumpectomy by Dr. Imogene Burn. Tsuei on 09/09/2013; pathology showed intraductal papilloma with no atypia or malignancy identified.  Marland Kitchen PONV (postoperative nausea and vomiting)   . Pre-diabetes    pt denies  . Uterine fibroid     Patient Active Problem List   Diagnosis Date Noted  . Bacterial vaginosis 07/07/2020  . Metastasis to liver ([place]) 06/14/2020  . Bone pain 05/27/2020  . Goals of care, counseling/discussion 05/16/2020  .  Endometrial cancer ([place]) 10/22/2019  . Postmenopausal bleeding 09/02/2019  . Biliary colic 85/50/1586  . Breast pain, left 05/20/2019  . Trapezius muscle spasm 05/11/2019  . Elevated random blood glucose level 02/13/2019  . Epigastric pain 01/29/2019  . Prediabetes 03/18/2017  . Hypertension 03/18/2017  . Beta thalassemia trait 11/25/2013  . Class II obesity 11/19/2012  . Women's annual routine gynecological examination 07/12/2011  . Gastroesophageal reflux disease 01/28/2007  . Hyperlipidemia 10/10/2006    Past Surgical History:  Procedure Laterality Date  . [place]   right  . BREAST EXCISIONAL BIOPSY Left 2014   benign  . BREAST LUMPECTOMY WITH NEEDLE LOCALIZATION Left 09/09/2013   Procedure: BREAST LUMPECTOMY WITH NEEDLE LOCALIZATION;  Surgeon: Imogene Burn. Georgette Dover, MD;  Location: [facility];  Service: General;  Laterality: Left;  . CHOLECYSTECTOMY    . COLONOSCOPY    . IR IMAGING GUIDED PORT INSERTION  05/18/2020  . POLYPECTOMY    . ROBOTIC ASSISTED LAPAROSCOPIC CHOLECYSTECTOMY  09/09/2019  . ROBOTIC ASSISTED TOTAL HYSTERECTOMY WITH BILATERAL SALPINGO OOPHERECTOMY N/A 10/22/2019   Procedure: XI ROBOTIC ASSISTED TOTAL HYSTERECTOMY WITH BILATERAL SALPINGO OOPHORECTOMY GREATER THAN 250 GRAMS, MINI LAPARTOMY FOR SPECIMEN DELIVERY; PELVIC AND PERI-AORTIC LYMPHADENECTOMY;  Surgeon: Everitt Amber, MD;  Location: [facility];  Service: Gynecology;  Laterality: N/A;  . SENTINEL NODE BIOPSY N/A 10/22/2019   Procedure: SENTINEL NODE BIOPSY;  Surgeon: Everitt Amber, MD;  Location: [facility];  Service: Gynecology;  Laterality: N/A;     OB History    Gravida  1   Para  0   Term  0   Preterm  0   AB  1   Living  0     SAB  0   TAB  1   Ectopic  0   Multiple  0   Live Births  0           Family History  Problem Relation Age of Onset  . Diabetes Mother   . Hypertension Mother   . Colon polyps Mother 49  . Dementia Mother 66  . Diabetes Father   . Congestive  Heart Failure Father   . Pancreatic cancer Paternal Aunt   . Colon cancer Neg Hx   . Breast cancer Neg Hx   . Lung cancer Neg Hx   . Esophageal cancer Neg Hx   . Rectal cancer Neg Hx   . Stomach cancer Neg Hx     Social History   Tobacco Use  . Smoking status: Never Smoker  . Smokeless tobacco: Never Used  . Tobacco comment: few puffs but not a true smoker quit many yrs ago  Vaping Use  . Vaping Use: Never used  Substance Use Topics  . Alcohol use: Never  . Drug use: Never    Home Medications Prior to Admission medications   Medication Sig Start Date End Date Taking? Authorizing Provider  acetaminophen (TYLENOL) 500 MG tablet Take 1,000 mg by mouth every 6 (six) hours as needed for moderate pain.   Yes [provider]  APPLE CIDER VINEGAR PO Take 5 mLs by mouth daily.    Yes [provider]  chlorthalidone (HYGROTON) 25 MG tablet TAKE 1/2 TABLET BY MOUTH DAILY Patient taking differently: Take 6.25 mg by mouth daily.  05/10/20  Yes Isaac Bliss, Rayford Halsted, MD  dexamethasone (DECADRON) 4 MG tablet Take 2 tabs at the night before and 2 tab the morning of chemotherapy, every 3 weeks, by mouth Patient taking differently: Take 4 mg by mouth See admin instructions. Take 2 tabs at the night before chemotherapy. 05/16/20  Yes Gorsuch, Ni, MD  Garlic 102 MG TABS Take 200 mg by mouth daily.   Yes [provider]  Multiple Vitamin (MULTIVITAMIN) capsule Take 1 capsule by mouth daily.   Yes [provider]  polyethylene glycol (MIRALAX / GLYCOLAX) 17 g packet Take 17 g by mouth daily as needed for mild constipation.   Yes [provider]  Blood Pressure Monitoring (BLOOD PRESSURE KIT) DEVI 1 Units by Does not apply route every morning. 07/01/20   Raylene Everts, MD  cephALEXin (KEFLEX) 250 MG capsule Take 250 mg by mouth 2 (two) times daily. Start date : 07/02/20    [provider]  lidocaine-prilocaine (EMLA) cream Apply to affected  area once Patient not taking: Reported on 07/09/2020 05/16/20   [name], MD  metroNIDAZOLE (FLAGYL) 500 MG tablet Take 1 tablet (500 mg total) by mouth 2 (two) times daily. 07/06/20   Lamptey, Myrene Galas, MD  ondansetron (ZOFRAN) 8 MG tablet Take 1 tablet (8 mg total) by mouth every 8 (eight) hours as needed. Start on the third day after chemotherapy. 05/16/20   [name], MD  predniSONE (DELTASONE) 50 MG tablet Take 1 pill at 13 hours, 7 hours and 1 hour before CT scan. Patient taking differently: Take 50 mg by mouth See admin instructions. Take 1 tabletl at 13 hours, 7 hours and 1 hour before CT scan. 07/08/20   [name], MD  prochlorperazine (  COMPAZINE) 10 MG tablet Take 1 tablet (10 mg total) by mouth every 6 (six) hours as needed (Nausea or vomiting). Patient not taking: Reported on 07/09/2020 05/16/20   [name], MD  tiZANidine (ZANAFLEX) 2 MG tablet Take 1 tablet (2 mg total) by mouth every 6 (six) hours as needed for muscle spasms. Patient not taking: Reported on 07/09/2020 04/21/20   Faustino Congress, NP  atorvastatin (LIPITOR) 20 MG tablet Take 1 tablet (20 mg total) by mouth daily. 05/03/20 07/01/20  Isaac Bliss, Rayford Halsted, MD    Allergies    Benicar hct [olmesartan medoxomil-hctz], Bactrim [sulfamethoxazole-trimethoprim], Pravastatin, Shellfish allergy, Amlodipine, and Iodinated diagnostic agents  Review of Systems   Review of Systems  Constitutional:       Per HPI, otherwise negative  HENT:       Per HPI, otherwise negative  Respiratory:       Per HPI, otherwise negative  Cardiovascular:       Per HPI, otherwise negative  Gastrointestinal: Negative for vomiting.  Endocrine:       Negative aside from HPI  Genitourinary:       Neg aside from HPI   Musculoskeletal:       Per HPI, otherwise negative  Skin: Negative.   Allergic/Immunologic: Positive for immunocompromised state.  Neurological: Positive for light-headedness. Negative for syncope.    Physical  Exam Updated Vital Signs BP 120/78 (BP Location: Right Arm)   Pulse 73   Temp 98.1 F (36.7 C) (Oral)   Resp 18   Ht '5\' 3"'  (1.6 m)   Wt 96.5 kg   SpO2 100%   BMI 37.70 kg/m   Physical Exam Vitals and nursing note reviewed.  Constitutional:      General: She is not in acute distress.    Appearance: She is well-developed.  HENT:     Head: Normocephalic and atraumatic.  Eyes:     Conjunctiva/sclera: Conjunctivae normal.  Cardiovascular:     Rate and Rhythm: Normal rate and regular rhythm.  Pulmonary:     Effort: Pulmonary effort is normal. No respiratory distress.     Breath sounds: Normal breath sounds. No stridor.  Abdominal:     General: There is no distension.  Skin:    General: Skin is warm and dry.  Neurological:     General: No focal deficit present.     Mental Status: She is alert and oriented to person, place, and time.     Cranial Nerves: No cranial nerve deficit.     Motor: No weakness, tremor, atrophy or abnormal muscle tone.      ED Results / Procedures / Treatments   Labs (all labs ordered are listed, but only abnormal results are displayed) Labs Reviewed  COMPREHENSIVE METABOLIC PANEL - Abnormal; Notable for the following components:      Result Value   Glucose, Bld 107 (*)    Calcium 8.7 (*)    All other components within normal limits  CBC WITH DIFFERENTIAL/PLATELET - Abnormal; Notable for the following components:   WBC 3.1 (*)    Hemoglobin 11.0 (*)    HCT 34.4 (*)    MCV 77.5 (*)    MCH 24.8 (*)    RDW 18.9 (*)    All other components within normal limits  URINALYSIS, ROUTINE W REFLEX MICROSCOPIC - Abnormal; Notable for the following components:   Color, Urine STRAW (*)    Specific Gravity, Urine 1.003 (*)    Hgb urine dipstick SMALL (*)  All other components within normal limits  LIPASE, BLOOD    EKG EKG Interpretation  Date/Time:  Saturday July 09 2020 13:41:10 EDT Ventricular Rate:  80 PR Interval:    QRS Duration: 86 QT  Interval:  373 QTC Calculation: 431 R Axis:   46 Text Interpretation: Age not entered, assumed to be  70 years old for purpose of ECG interpretation Sinus rhythm Low voltage, precordial leads Artifact Abnormal ECG Confirmed by Carmin Muskrat [phone]) on 07/09/2020 3:10:36 PM   Radiology DG Chest Port 1 View  Result Date: 07/09/2020 CLINICAL DATA:  Near syncope.  History of uterine carcinoma EXAM: PORTABLE CHEST 1 VIEW COMPARISON:  February 11, 2019 FINDINGS: Port-A-Cath tip is at the cavoatrial junction. No pneumothorax. The lungs are clear. The heart size and pulmonary vascularity are within normal limits. No adenopathy. There is aortic atherosclerosis. No bone lesions. IMPRESSION: Port-A-Cath tip at cavoatrial junction. No pneumothorax. Lungs clear. Heart size within normal limits. Aortic Atherosclerosis (ICD10-I70.0). Electronically Signed   By: Lowella Grip III M.D.   On: 07/09/2020 14:58    Procedures Procedures (including critical care time)  Medications Ordered in ED Medications  sodium chloride 0.9 % bolus 500 mL (0 mLs Intravenous Stopped 07/09/20 1519)    ED Course  I have reviewed the triage vital signs and the nursing notes.  Pertinent labs & imaging results that were available during my care of the patient were reviewed by me and considered in my medical decision making (see chart for details).  Elderly female with ongoing therapy for endometrial cancer, bacterial vaginosis presents with new near syncope.  Differential including infection, lecture light abnormality, arrhythmia, progression of disease all considered, appropriate labs, imaging ordered.   MDM Rules/Calculators/A&P                          4:12 PM Patient in no distress, awake, alert, sitting upright, states that she feels better having received IV fluids here. She now accompanied by her sister, we discussed today's presentation, lab results, which are generally reassuring, no evidence for bacteremia,  sepsis, electrolyte abnormalities.  Urinalysis reassuring as well.  We discussed possibilities for her lightheadedness, possibly secondary to dehydration versus medication effect versus chemotherapy. Absent alarming findings, as above, patient is appropriate for, amenable to close outpatient follow-up with her oncologist.  MDM Number of Diagnoses or Management Options Lightheadedness: new, needed workup   Amount and/or Complexity of Data Reviewed Clinical lab tests: reviewed Tests in the radiology section of CPT: reviewed Tests in the medicine section of CPT: reviewed Decide to obtain previous medical records or to obtain history from someone other than the patient: yes Obtain history from someone other than the patient: yes Review and summarize past medical records: yes Independent visualization of images, tracings, or specimens: yes  Risk of Complications, Morbidity, and/or Mortality Presenting problems: high Diagnostic procedures: high Management options: high  Critical Care Total time providing critical care: < 30 minutes  Patient Progress Patient progress: stable  Final Clinical Impression(s) / ED Diagnoses Final diagnoses:  Lightheadedness    Rx / DC Orders ED Discharge Orders    None       Carmin Muskrat, MD 07/09/20 [phone]

## 2020-07-09 NOTE — Discharge Instructions (Addendum)
As discussed, your evaluation today has been largely reassuring.  But, it is important that you monitor your condition carefully, and do not hesitate to return to the ED if you develop new, or concerning changes in your condition. ? ?Otherwise, please follow-up with your physician for appropriate ongoing care. ? ?

## 2020-07-09 NOTE — ED Triage Notes (Addendum)
Patient presents with what she believes to be an allergic reaction to Flagyl which she started taking yesterday. Since then she has experienced intermittent dizziness, lightheadedness, neck stiffness and hand numbness. She also complains of and upset stomach. The patient has been trying to remedy the effects by drinking water. Last chemo was this Thursday.

## 2020-07-11 ENCOUNTER — Telehealth: Payer: Self-pay

## 2020-07-11 NOTE — Telephone Encounter (Signed)
TC from Pt. Informing Dr. Alvy Bimler that she had gone to the ER over the weekend because she was having Dizziness, and neck stiffness and numbness to face, leg , foot and one side of the body. Pt stated the doctor she saw in the ER stated that her urine tests were negative and she has stopped taking the flagyl. Call was a follow from triage line over the weekend. Dr Alvy Bimler informed.

## 2020-07-12 ENCOUNTER — Other Ambulatory Visit: Payer: Self-pay

## 2020-07-12 ENCOUNTER — Encounter: Payer: Self-pay | Admitting: Internal Medicine

## 2020-07-12 ENCOUNTER — Ambulatory Visit (INDEPENDENT_AMBULATORY_CARE_PROVIDER_SITE_OTHER): Payer: Medicare Other | Admitting: Internal Medicine

## 2020-07-12 VITALS — BP 130/80 | HR 90 | Temp 98.6°F | Wt 213.9 lb

## 2020-07-12 DIAGNOSIS — I10 Essential (primary) hypertension: Secondary | ICD-10-CM

## 2020-07-12 DIAGNOSIS — E785 Hyperlipidemia, unspecified: Secondary | ICD-10-CM | POA: Diagnosis not present

## 2020-07-12 DIAGNOSIS — E669 Obesity, unspecified: Secondary | ICD-10-CM

## 2020-07-12 DIAGNOSIS — Z09 Encounter for follow-up examination after completed treatment for conditions other than malignant neoplasm: Secondary | ICD-10-CM

## 2020-07-12 DIAGNOSIS — C541 Malignant neoplasm of endometrium: Secondary | ICD-10-CM

## 2020-07-12 NOTE — Progress Notes (Signed)
Established Patient Office Visit     This visit occurred during the SARS-CoV-2 public health emergency.  Safety protocols were in place, including screening questions prior to the visit, additional usage of staff PPE, and extensive cleaning of exam room while observing appropriate contact time as indicated for disinfecting solutions.    CC/Reason for Visit: ED follow-up, discuss blood pressure medications  HPI: Tina Stone is a 70 y.o. female who is coming in today for the above mentioned reasons. Past Medical History is significant for: Hypertension that has been well controlled on chlorthalidone, GERD not on PPI therapy, hyperlipidemia not on statins due to history of intolerance with myalgias, history of endometrial cancer.  She visited the emergency department on 7/10 due to a conglomerate of symptoms including lightheadedness, headache.  She had recently been prescribed Flagyl to treat BV by her GYN.  Work-up in the ED was normal.  She was advised to take 1/4 pill of chlorthalidone (6.25 mg) and to follow-up with me.  She has had no further issues since then.    Past Medical/Surgical History: Past Medical History:  Diagnosis Date  . Adenomatous colon polyp   . Allergic rhinitis, seasonal   . Allergy   . Beta thalassemia trait 11/25/2013  . Cholelithiasis   . Class 3 obesity without serious comorbidity with body mass index (BMI) of 40.0 to 44.9 in adult 11/19/2012  . endometrial ca dx'd 08/2009   endometrial   . GERD (gastroesophageal reflux disease)   . HLD (hyperlipidemia)   . Hypercholesterolemia   . Hypertension 03/18/2017   no meds   . Intraductal papilloma of left breast    Patient underwent left needle-localized lumpectomy by Dr. Imogene Burn. Tsuei on 09/09/2013; pathology showed intraductal papilloma with no atypia or malignancy identified.  Marland Kitchen PONV (postoperative nausea and vomiting)   . Pre-diabetes    pt denies  . Uterine fibroid     Past Surgical History:   Procedure Laterality Date  . [place]   right  . BREAST EXCISIONAL BIOPSY Left 2014   benign  . BREAST LUMPECTOMY WITH NEEDLE LOCALIZATION Left 09/09/2013   Procedure: BREAST LUMPECTOMY WITH NEEDLE LOCALIZATION;  Surgeon: Imogene Burn. Georgette Dover, MD;  Location: [facility];  Service: General;  Laterality: Left;  . CHOLECYSTECTOMY    . COLONOSCOPY    . IR IMAGING GUIDED PORT INSERTION  05/18/2020  . POLYPECTOMY    . ROBOTIC ASSISTED LAPAROSCOPIC CHOLECYSTECTOMY  09/09/2019  . ROBOTIC ASSISTED TOTAL HYSTERECTOMY WITH BILATERAL SALPINGO OOPHERECTOMY N/A 10/22/2019   Procedure: XI ROBOTIC ASSISTED TOTAL HYSTERECTOMY WITH BILATERAL SALPINGO OOPHORECTOMY GREATER THAN 250 GRAMS, MINI LAPARTOMY FOR SPECIMEN DELIVERY; PELVIC AND PERI-AORTIC LYMPHADENECTOMY;  Surgeon: Everitt Amber, MD;  Location: [facility];  Service: Gynecology;  Laterality: N/A;  . SENTINEL NODE BIOPSY N/A 10/22/2019   Procedure: SENTINEL NODE BIOPSY;  Surgeon: Everitt Amber, MD;  Location: [facility];  Service: Gynecology;  Laterality: N/A;    Social History:  reports that she has never smoked. She has never used smokeless tobacco. She reports that she does not drink alcohol and does not use drugs.  Allergies: Allergies  Allergen Reactions  . Benicar Hct [Olmesartan Medoxomil-Hctz] Shortness Of Breath and Palpitations  . Bactrim [Sulfamethoxazole-Trimethoprim] Other (See Comments)    Abdominal pain, dizziness  . Pravastatin Other (See Comments)    Lower abdominal pain  . Shellfish Allergy Nausea And Vomiting  . Amlodipine Palpitations  . Iodinated Diagnostic Agents Itching    Developed itching  and hives after injection on 05/10/20; needs 13hr prep in future    Family History:  Family History  Problem Relation Age of Onset  . Diabetes Mother   . Hypertension Mother   . Colon polyps Mother 39  . Dementia Mother 75  . Diabetes Father   . Congestive Heart Failure Father   . Pancreatic cancer Paternal Aunt   . Colon  cancer Neg Hx   . Breast cancer Neg Hx   . Lung cancer Neg Hx   . Esophageal cancer Neg Hx   . Rectal cancer Neg Hx   . Stomach cancer Neg Hx      Current Outpatient Medications:  .  acetaminophen (TYLENOL) 500 MG tablet, Take 1,000 mg by mouth every 6 (six) hours as needed for moderate pain., Disp: , Rfl:  .  APPLE CIDER VINEGAR PO, Take 5 mLs by mouth daily. , Disp: , Rfl:  .  Blood Pressure Monitoring (BLOOD PRESSURE KIT) DEVI, 1 Units by Does not apply route every morning., Disp: 1 each, Rfl: 0 .  dexamethasone (DECADRON) 4 MG tablet, Take 2 tabs at the night before and 2 tab the morning of chemotherapy, every 3 weeks, by mouth (Patient taking differently: Take 4 mg by mouth See admin instructions. Take 2 tabs at the night before chemotherapy.), Disp: 20 tablet, Rfl: 6 .  Multiple Vitamin (MULTIVITAMIN) capsule, Take 1 capsule by mouth daily., Disp: , Rfl:  .  polyethylene glycol (MIRALAX / GLYCOLAX) 17 g packet, Take 17 g by mouth daily as needed for mild constipation., Disp: , Rfl:  .  prochlorperazine (COMPAZINE) 10 MG tablet, Take 1 tablet (10 mg total) by mouth every 6 (six) hours as needed (Nausea or vomiting)., Disp: 30 tablet, Rfl: 1 .  Garlic 706 MG TABS, Take 200 mg by mouth daily. (Patient not taking: Reported on 07/12/2020), Disp: , Rfl:  .  lidocaine-prilocaine (EMLA) cream, Apply to affected area once (Patient not taking: Reported on 07/09/2020), Disp: 30 g, Rfl: 3 .  ondansetron (ZOFRAN) 8 MG tablet, Take 1 tablet (8 mg total) by mouth every 8 (eight) hours as needed. Start on the third day after chemotherapy. (Patient not taking: Reported on 07/12/2020), Disp: 30 tablet, Rfl: 1 .  predniSONE (DELTASONE) 50 MG tablet, Take 1 pill at 13 hours, 7 hours and 1 hour before CT scan. (Patient not taking: Reported on 07/12/2020), Disp: 3 tablet, Rfl: 0  Review of Systems:  Constitutional: Denies fever, chills, diaphoresis, appetite change and fatigue.  HEENT: Denies photophobia, eye  pain, redness, hearing loss, ear pain, congestion, sore throat, rhinorrhea, sneezing, mouth sores, trouble swallowing, neck pain, neck stiffness and tinnitus.   Respiratory: Denies SOB, DOE, cough, chest tightness,  and wheezing.   Cardiovascular: Denies chest pain, palpitations and leg swelling.  Gastrointestinal: Denies nausea, vomiting, abdominal pain, diarrhea, constipation, blood in stool and abdominal distention.  Genitourinary: Denies dysuria, urgency, frequency, hematuria, flank pain and difficulty urinating.  Endocrine: Denies: hot or cold intolerance, sweats, changes in hair or nails, polyuria, polydipsia. Musculoskeletal: Denies myalgias, back pain, joint swelling, arthralgias and gait problem.  Skin: Denies pallor, rash and wound.  Neurological: Denies dizziness, seizures, syncope, weakness, light-headedness, numbness and headaches.  Hematological: Denies adenopathy. Easy bruising, personal or family bleeding history  Psychiatric/Behavioral: Denies suicidal ideation, mood changes, confusion, nervousness, sleep disturbance and agitation    Physical Exam: Vitals:   07/12/20 0823  BP: 130/80  Pulse: 90  Temp: 98.6 F (37 C)  TempSrc: Temporal  SpO2:  98%  Weight: 213 lb 14.4 oz (97 kg)    Body mass index is 37.89 kg/m.   Constitutional: NAD, calm, comfortable Eyes: PERRL, lids and conjunctivae normal ENMT: Mucous membranes are moist.  Respiratory: clear to auscultation bilaterally, no wheezing, no crackles. Normal respiratory effort. No accessory muscle use.  Cardiovascular: Regular rate and rhythm, no murmurs / rubs / gallops. No extremity edema. Neurologic: Grossly intact and nonfocal Psychiatric: Normal judgment and insight. Alert and oriented x 3. Normal mood.    Impression and Plan:  Hospital discharge follow-up  Essential hypertension  Hyperlipidemia, unspecified hyperlipidemia type  Class II obesity  Endometrial cancer ([place])  -Not sure that that low  of a dose of chlorthalidone is providing any benefit, in addition it is hard to ensure that dose is the same every day. -I have asked her to stop taking chlorthalidone altogether.  Over the next 6 weeks she will keep a blood pressure chart and return to see me at that time to decide if we need to resume a different antihypertensive agent.    Patient Instructions  -Nice seeing you today!!  -STOP chlorthalidone.  -Check blood pressure at home every day and bring chart into your next visit.  -Schedule follow up visit in 6 weeks.     Lelon Frohlich, MD [facility]

## 2020-07-12 NOTE — Patient Instructions (Signed)
-  Nice seeing you today!!  -STOP chlorthalidone.  -Check blood pressure at home every day and bring chart into your next visit.  -Schedule follow up visit in 6 weeks.

## 2020-07-26 ENCOUNTER — Other Ambulatory Visit: Payer: Self-pay | Admitting: Hematology and Oncology

## 2020-07-28 ENCOUNTER — Ambulatory Visit (HOSPITAL_COMMUNITY)
Admission: RE | Admit: 2020-07-28 | Discharge: 2020-07-28 | Disposition: A | Discharge status: Home / Self Care | Payer: Medicare Other | Source: Ambulatory Visit | Attending: Hematology and Oncology | Admitting: Hematology and Oncology

## 2020-07-28 ENCOUNTER — Other Ambulatory Visit: Payer: Self-pay

## 2020-07-28 ENCOUNTER — Encounter (HOSPITAL_COMMUNITY): Payer: Self-pay

## 2020-07-28 DIAGNOSIS — C787 Secondary malignant neoplasm of liver and intrahepatic bile duct: Secondary | ICD-10-CM

## 2020-07-28 DIAGNOSIS — C541 Malignant neoplasm of endometrium: Secondary | ICD-10-CM | POA: Diagnosis not present

## 2020-07-28 MED ORDER — DIPHENHYDRAMINE HCL 25 MG PO CAPS: 50.0000 mg | ORAL_CAPSULE | Freq: Once | ORAL | Status: AC

## 2020-07-28 MED ORDER — DIPHENHYDRAMINE HCL 25 MG PO CAPS
ORAL_CAPSULE | ORAL | Status: AC
  Administered 2020-07-28: 50 mg via ORAL
  Filled 2020-07-28: qty 2

## 2020-07-28 MED ORDER — IOHEXOL 300 MG/ML  SOLN
100.0000 mL | Freq: Once | INTRAMUSCULAR | Status: AC | PRN
  Administered 2020-07-28: 100 mL via INTRAVENOUS

## 2020-07-28 MED ORDER — SODIUM CHLORIDE (PF) 0.9 % IJ SOLN
INTRAMUSCULAR | Status: AC
  Filled 2020-07-28: qty 50

## 2020-08-01 ENCOUNTER — Inpatient Hospital Stay: Payer: Medicare Other

## 2020-08-01 ENCOUNTER — Inpatient Hospital Stay: Payer: Medicare Other | Attending: Gynecologic Oncology

## 2020-08-01 ENCOUNTER — Encounter: Payer: Self-pay | Admitting: Hematology and Oncology

## 2020-08-01 ENCOUNTER — Inpatient Hospital Stay (HOSPITAL_BASED_OUTPATIENT_CLINIC_OR_DEPARTMENT_OTHER): Payer: Medicare Other | Admitting: Hematology and Oncology

## 2020-08-01 ENCOUNTER — Other Ambulatory Visit: Payer: Self-pay

## 2020-08-01 ENCOUNTER — Ambulatory Visit: Payer: Medicare Other

## 2020-08-01 ENCOUNTER — Telehealth: Payer: Self-pay | Admitting: Hematology and Oncology

## 2020-08-01 DIAGNOSIS — Z5111 Encounter for antineoplastic chemotherapy: Secondary | ICD-10-CM | POA: Insufficient documentation

## 2020-08-01 DIAGNOSIS — C541 Malignant neoplasm of endometrium: Secondary | ICD-10-CM

## 2020-08-01 DIAGNOSIS — C787 Secondary malignant neoplasm of liver and intrahepatic bile duct: Secondary | ICD-10-CM | POA: Diagnosis not present

## 2020-08-01 DIAGNOSIS — D563 Thalassemia minor: Secondary | ICD-10-CM

## 2020-08-01 DIAGNOSIS — Z7189 Other specified counseling: Secondary | ICD-10-CM

## 2020-08-01 DIAGNOSIS — Z79899 Other long term (current) drug therapy: Secondary | ICD-10-CM | POA: Diagnosis not present

## 2020-08-01 LAB — CBC WITH DIFFERENTIAL (CANCER CENTER ONLY)
Abs Immature Granulocytes: 0.06 10*3/uL (ref 0.00–0.07)
Basophils Absolute: 0 10*3/uL (ref 0.0–0.1)
Basophils Relative: 0 %
Eosinophils Absolute: 0 10*3/uL (ref 0.0–0.5)
Eosinophils Relative: 0 %
HCT: 36 % (ref 36.0–46.0)
Hemoglobin: 11.5 g/dL — ABNORMAL LOW (ref 12.0–15.0)
Immature Granulocytes: 1 %
Lymphocytes Relative: 11 %
Lymphs Abs: 0.6 10*3/uL — ABNORMAL LOW (ref 0.7–4.0)
MCH: 24.6 pg — ABNORMAL LOW (ref 26.0–34.0)
MCHC: 31.9 g/dL (ref 30.0–36.0)
MCV: 76.9 fL — ABNORMAL LOW (ref 80.0–100.0)
Monocytes Absolute: 0.2 10*3/uL (ref 0.1–1.0)
Monocytes Relative: 3 %
Neutro Abs: 4.7 10*3/uL (ref 1.7–7.7)
Neutrophils Relative %: 85 %
Platelet Count: 193 10*3/uL (ref 150–400)
RBC: 4.68 MIL/uL (ref 3.87–5.11)
RDW: 19.5 % — ABNORMAL HIGH (ref 11.5–15.5)
WBC Count: 5.5 10*3/uL (ref 4.0–10.5)
nRBC: 0 % (ref 0.0–0.2)

## 2020-08-01 LAB — CMP (CANCER CENTER ONLY)
ALT: 9 U/L (ref 0–44)
AST: 15 U/L (ref 15–41)
Albumin: 3.5 g/dL (ref 3.5–5.0)
Alkaline Phosphatase: 94 U/L (ref 38–126)
Anion gap: 10 (ref 5–15)
BUN: 10 mg/dL (ref 8–23)
CO2: 22 mmol/L (ref 22–32)
Calcium: 10 mg/dL (ref 8.9–10.3)
Chloride: 105 mmol/L (ref 98–111)
Creatinine: 0.8 mg/dL (ref 0.44–1.00)
GFR, Est AFR Am: >60 mL/min (ref >60)
GFR, Estimated: >60 mL/min (ref >60)
Glucose, Bld: 126 mg/dL — ABNORMAL HIGH (ref 70–99)
Potassium: 4 mmol/L (ref 3.5–5.1)
Sodium: 137 mmol/L (ref 135–145)
Total Bilirubin: 0.3 mg/dL (ref 0.3–1.2)
Total Protein: 7.3 g/dL (ref 6.5–8.1)

## 2020-08-01 MED ORDER — DIPHENHYDRAMINE HCL 50 MG/ML IJ SOLN
INTRAMUSCULAR | Status: AC
  Filled 2020-08-01: qty 1

## 2020-08-01 MED ORDER — HEPARIN SOD (PORK) LOCK FLUSH 100 UNIT/ML IV SOLN
500.0000 [IU] | Freq: Once | INTRAVENOUS | Status: AC | PRN
  Administered 2020-08-01: 500 [IU]
  Filled 2020-08-01: qty 5

## 2020-08-01 MED ORDER — SODIUM CHLORIDE 0.9 % IV SOLN
Freq: Once | INTRAVENOUS | Status: AC
  Filled 2020-08-01: qty 250

## 2020-08-01 MED ORDER — SODIUM CHLORIDE 0.9 % IV SOLN
514.8000 mg | Freq: Once | INTRAVENOUS | Status: AC
  Administered 2020-08-01: 510 mg via INTRAVENOUS
  Filled 2020-08-01: qty 51

## 2020-08-01 MED ORDER — SODIUM CHLORIDE 0.9% FLUSH
10.0000 mL | INTRAVENOUS | Status: DC | PRN
  Administered 2020-08-01: 10 mL
  Filled 2020-08-01: qty 10

## 2020-08-01 MED ORDER — PALONOSETRON HCL INJECTION 0.25 MG/5ML
0.2500 mg | Freq: Once | INTRAVENOUS | Status: AC
  Administered 2020-08-01: 0.25 mg via INTRAVENOUS

## 2020-08-01 MED ORDER — PALONOSETRON HCL INJECTION 0.25 MG/5ML
INTRAVENOUS | Status: AC
  Filled 2020-08-01: qty 5

## 2020-08-01 MED ORDER — FAMOTIDINE IN NACL 20-0.9 MG/50ML-% IV SOLN
INTRAVENOUS | Status: AC
  Filled 2020-08-01: qty 50

## 2020-08-01 MED ORDER — SODIUM CHLORIDE 0.9 % IV SOLN
175.0000 mg/m2 | Freq: Once | INTRAVENOUS | Status: AC
  Administered 2020-08-01: 312 mg via INTRAVENOUS
  Filled 2020-08-01: qty 52

## 2020-08-01 MED ORDER — SODIUM CHLORIDE 0.9% FLUSH
10.0000 mL | Freq: Once | INTRAVENOUS | Status: AC
  Administered 2020-08-01: 10 mL
  Filled 2020-08-01: qty 10

## 2020-08-01 MED ORDER — FAMOTIDINE IN NACL 20-0.9 MG/50ML-% IV SOLN
20.0000 mg | Freq: Once | INTRAVENOUS | Status: AC
  Administered 2020-08-01: 20 mg via INTRAVENOUS

## 2020-08-01 MED ORDER — SODIUM CHLORIDE 0.9 % IV SOLN
10.0000 mg | Freq: Once | INTRAVENOUS | Status: AC
  Administered 2020-08-01: 10 mg via INTRAVENOUS
  Filled 2020-08-01: qty 10

## 2020-08-01 MED ORDER — DIPHENHYDRAMINE HCL 50 MG/ML IJ SOLN
50.0000 mg | Freq: Once | INTRAMUSCULAR | Status: AC
  Administered 2020-08-01: 50 mg via INTRAVENOUS

## 2020-08-01 MED ORDER — SODIUM CHLORIDE 0.9 % IV SOLN
150.0000 mg | Freq: Once | INTRAVENOUS | Status: AC
  Administered 2020-08-01: 150 mg via INTRAVENOUS
  Filled 2020-08-01: qty 150

## 2020-08-01 NOTE — Assessment & Plan Note (Signed)
She has chronic stable anemia Observe for now 

## 2020-08-01 NOTE — Telephone Encounter (Signed)
Scheduled per 8/2 sch msg. Printed appt calendar given to pt

## 2020-08-01 NOTE — Patient Instructions (Signed)
[facility] Discharge Instructions for Patients Receiving Chemotherapy  Today you received the following chemotherapy agents: Paclitaxel (Taxol) and Carboplatin.  To help prevent nausea and vomiting after your treatment, we encourage you to take your nausea medication as directed by your MD.   If you develop nausea and vomiting that is not controlled by your nausea medication, call the clinic.   BELOW ARE SYMPTOMS THAT SHOULD BE REPORTED IMMEDIATELY:  *FEVER GREATER THAN 100.5 F  *CHILLS WITH OR WITHOUT FEVER  NAUSEA AND VOMITING THAT IS NOT CONTROLLED WITH YOUR NAUSEA MEDICATION  *UNUSUAL SHORTNESS OF BREATH  *UNUSUAL BRUISING OR BLEEDING  TENDERNESS IN MOUTH AND THROAT WITH OR WITHOUT PRESENCE OF ULCERS  *URINARY PROBLEMS  *BOWEL PROBLEMS  UNUSUAL RASH Items with * indicate a potential emergency and should be followed up as soon as possible.  Feel free to call the clinic should you have any questions or concerns. The clinic phone number is [phone].  Please show the CHEMO ALERT CARD at check-in to the Emergency Department and triage nurse.    

## 2020-08-01 NOTE — Assessment & Plan Note (Signed)
She has near complete response to therapy We will plan to proceed with further treatment and repeat imaging study again in a few months

## 2020-08-01 NOTE — Progress Notes (Signed)
[place] OFFICE PROGRESS NOTE  Patient Care Team: Isaac Bliss, Rayford Halsted, MD as PCP - General (Internal Medicine) Axel Filler, MD  ASSESSMENT & PLAN:  Endometrial cancer [facility]) I reviewed multiple imaging studies with the patient She has near complete response to therapy I recommend we proceed with a few more cycles of treatment rather than stopping and she is in agreement  Metastasis to liver [facility]) She has near complete response to therapy We will plan to proceed with further treatment and repeat imaging study again in a few months  Beta thalassemia trait She has chronic stable anemia Observe for now   No orders of the defined types were placed in this encounter.   All questions were answered. The patient knows to call the clinic with any problems, questions or concerns. The total time spent in the appointment was 20 minutes encounter with patients including review of chart and various tests results, discussions about plan of care and coordination of care plan   [name], MD 08/01/2020 5:59 PM  INTERVAL HISTORY: Please see below for problem oriented charting. She returns for further follow-up She tolerated recent chemotherapy well No recent nausea or vomiting No peripheral neuropathy  SUMMARY OF ONCOLOGIC HISTORY: Oncology History Overview Note  Poorly differentiated carcinoma, mixed histology with squamous differentiation, rare focus of clear cells as well as serous features MSI stable Her2 negative   Endometrial cancer ([place])  09/01/2019 Initial Diagnosis   The patient reported a history of postmenopausal bleeding that began 1 to 2 months before diagnosis   09/14/2019 Imaging   US pelvis 1. Enlarged uterus with numerous myometrial masses, presumably fibroids. 2. Endometrial thickness of 6.2 mm. In the setting of post-menopausal bleeding, endometrial sampling is indicated to exclude carcinoma. If results are benign, sonohysterogram  should be considered for focal lesion work-up.  3. Nonvisualized ovaries   09/23/2019 Pathology Results   A. ENDOMETRIUM, BIOPSY:  - Poorly differentiated carcinoma   10/15/2019 Imaging   Ct scan of chest, abdomen and pelvis: No evidence of metastatic disease within the chest, abdomen, or pelvis.   Enlarged fibroid uterus.   Colonic diverticulosis. No radiographic evidence of diverticulitis.   Aortic and coronary artery atherosclerosis.     10/22/2019 Pathology Results   SURGICAL PATHOLOGY   FINAL MICROSCOPIC DIAGNOSIS:   A. UTERUS, BILATERAL TUBES AND OVARIES, HYSTERECTOMY:  Poorly differentiated carcinoma, 6.5 cm.  Lymphovascular involvement by tumor.  Carcinoma involves inner half of the myometrium.  Margins not involved.  Cervix, bilateral fallopian tubes and bilateral ovaries free of tumor.   B. LYMPH NODE, RIGHT EXTERNAL SENTINEL, BIOPSY:  One lymph node with no metastatic carcinoma (0/1).   C. LYMPH NODE, RIGHT PELVIC, BIOPSY  Five lymph nodes with no metastatic carcinoma (0/5).   D. LYMPH NODE, LEFT PELVIC, BIOPSY:  Five lymph nodes with no metastatic carcinoma (0/5).   E. LYMPH NODE, RIGHT PERI AORTIC, BIOPSY:  One lymph node with no metastatic carcinoma (0/1).   F. LYMPH NODE, LEFT PERI AORTIC, BIOPSY:  Five lymph nodes with no metastatic carcinoma (0/5).    ONCOLOGY TABLE:  UTERUS, CARCINOMA OR CARCINOSARCOMA   Procedure: Total hysterectomy with bilateral f-oophorectomy and sentinel  lymph nodes.  Histologic type: Poorly differentiated carcinoma, see comment.  Histologic Grade: High-grade, FIGO 3.  Myometrial invasion:       Depth of invasion: 13 mm       Myometrial thickness: 40 mm  Uterine Serosa Involvement: Not identified  Cervical stromal involvement: Not identified  Extent of involvement of other organs: Not identified  Lymphovascular invasion: Present  Regional Lymph Nodes:       Examined:     17 Sentinel                               0  non-sentinel                               17 total        Lymph nodes with metastasis: 0        Isolated tumor cells (<0.2 mm): 0        Micrometastasis:  (>0.2 mm and < 2.0 mm): 0        Macrometastasis: (>2.0 mm): 0  Representative Tumor Block: A5, A6, A7 and A8.  MMR / MSI testing: Pending  Pathologic Stage Classification (pTNM, AJCC 8th edition):  pT1a, pN0  Comments: The carcinoma is a high-grade poorly differentiated carcinoma which morphologically has predominantly serous features.  There are a few foci with squamous differentiation and a rare focus of clear cell features.  Immunohistochemistry for cytokeratin AE1/AE3 is performed on the sentinel lymph nodes and no positivity is identified.    10/22/2019 Surgery   Pre-operative Diagnosis: endometrial cancer grade 3   Post-operative Diagnosis: same,    Operation: Robotic-assisted laparoscopic total hysterectomy for uterus >250gm with bilateral salpingoophorectomy, SLN mapping, bilateral pelvic and para-aortic lymphadenectomy.   Surgeon: Donaciano Eva    Operative Findings:  : 16cm bulky fibroid uterus, normal ovaries bilaterally, no suspicious lymph nodes.     11/10/2019 Cancer Staging   Staging form: Corpus Uteri - Carcinoma and Carcinosarcoma, AJCC 8th Edition - Pathologic: Stage IVB (pT1a, pN0, cM1) - Signed by [name], MD on 05/16/2020   05/10/2020 Imaging   1. New hypodense 2.0 cm segment 4A left liver lobe mass, suspicious for hepatic metastasis. 2. New left pelvic sidewall 1.4 cm soft tissue nodule, suspicious for left internal iliac nodal metastasis. 3. New left vaginal cuff 1.6 x 1.3 cm soft tissue nodule, suspicious for recurrent tumor. 4. Aortic Atherosclerosis (ICD10-I70.0).   05/18/2020 Procedure   Successful placement of a right internal jugular approach power injectable Port-A-Cath. The catheter is ready for immediate use.   05/23/2020 -  Chemotherapy   The patient had carboplatin and taxol for  chemotherapy treatment.     07/28/2020 Imaging   1. No findings identified to suggest recurrent or metastatic disease. 2. Indeterminate and slightly exophytic lesion arising from the inferior pole of left kidney measures 0.8 cm. Technically this is too small to reliably characterize. Attention on follow-up imaging is advised. 3. Aortic atherosclerosis and LAD coronary artery atherosclerotic calcifications.     REVIEW OF SYSTEMS:   Constitutional: Denies fevers, chills or abnormal weight loss Eyes: Denies blurriness of vision Ears, nose, mouth, throat, and face: Denies mucositis or sore throat Respiratory: Denies cough, dyspnea or wheezes Cardiovascular: Denies palpitation, chest discomfort or lower extremity swelling Gastrointestinal:  Denies nausea, heartburn or change in bowel habits Skin: Denies abnormal skin rashes Lymphatics: Denies new lymphadenopathy or easy bruising Neurological:Denies numbness, tingling or new weaknesses Behavioral/Psych: Mood is stable, no new changes  All other systems were reviewed with the patient and are negative.  I have reviewed the past medical history, past surgical history, social history and family history with the patient and they are unchanged from previous note.  ALLERGIES:  is allergic to benicar hct [olmesartan medoxomil-hctz], bactrim [sulfamethoxazole-trimethoprim], pravastatin, shellfish allergy, amlodipine, and iodinated diagnostic agents.  MEDICATIONS:  Current Outpatient Medications  Medication Sig Dispense Refill  . acetaminophen (TYLENOL) 500 MG tablet Take 1,000 mg by mouth every 6 (six) hours as needed for moderate pain.    . APPLE CIDER VINEGAR PO Take 5 mLs by mouth daily.     . Blood Pressure Monitoring (BLOOD PRESSURE KIT) DEVI 1 Units by Does not apply route every morning. 1 each 0  . dexamethasone (DECADRON) 4 MG tablet Take 2 tabs at the night before and 2 tab the morning of chemotherapy, every 3 weeks, by mouth (Patient  taking differently: Take 4 mg by mouth See admin instructions. Take 2 tabs at the night before chemotherapy.) 20 tablet 6  . Garlic 828 MG TABS Take 200 mg by mouth daily. (Patient not taking: Reported on 07/12/2020)    . lidocaine-prilocaine (EMLA) cream Apply to affected area once (Patient not taking: Reported on 07/09/2020) 30 g 3  . Multiple Vitamin (MULTIVITAMIN) capsule Take 1 capsule by mouth daily.    . ondansetron (ZOFRAN) 8 MG tablet Take 1 tablet (8 mg total) by mouth every 8 (eight) hours as needed. Start on the third day after chemotherapy. (Patient not taking: Reported on 07/12/2020) 30 tablet 1  . polyethylene glycol (MIRALAX / GLYCOLAX) 17 g packet Take 17 g by mouth daily as needed for mild constipation.    . predniSONE (DELTASONE) 50 MG tablet Take 1 pill at 13 hours, 7 hours and 1 hour before CT scan. (Patient not taking: Reported on 07/12/2020) 3 tablet 0  . prochlorperazine (COMPAZINE) 10 MG tablet Take 1 tablet (10 mg total) by mouth every 6 (six) hours as needed (Nausea or vomiting). 30 tablet 1   No current facility-administered medications for this visit.   Facility-Administered Medications Ordered in Other Visits  Medication Dose Route Frequency Provider Last Rate Last Admin  . sodium chloride flush (NS) 0.9 % injection 10 mL  10 mL Intracatheter PRN Alvy Bimler, Daishon Chui, MD   10 mL at 08/01/20 1431    PHYSICAL EXAMINATION: ECOG PERFORMANCE STATUS: 1 - Symptomatic but completely ambulatory  Vitals:   08/01/20 0757  BP: (!) 137/76  Pulse: 85  Resp: 17  Temp: 98.3 F (36.8 C)  SpO2: 100%   Filed Weights   08/01/20 0757  Weight: (!) 215 lb 3.2 oz (97.6 kg)    GENERAL:alert, no distress and comfortable SKIN: skin color, texture, turgor are normal, no rashes or significant lesions EYES: normal, Conjunctiva are pink and non-injected, sclera clear OROPHARYNX:no exudate, no erythema and lips, buccal mucosa, and tongue normal  NECK: supple, thyroid normal size, non-tender,  without nodularity LYMPH:  no palpable lymphadenopathy in the cervical, axillary or inguinal LUNGS: clear to auscultation and percussion with normal breathing effort HEART: regular rate & rhythm and no murmurs and no lower extremity edema ABDOMEN:abdomen soft, non-tender and normal bowel sounds Musculoskeletal:no cyanosis of digits and no clubbing  NEURO: alert & oriented x 3 with fluent speech, no focal motor/sensory deficits  LABORATORY DATA:  I have reviewed the data as listed    Component Value Date/Time   NA 137 08/01/2020 0742   NA 140 07/04/2017 0952   K 4.0 08/01/2020 0742   CL 105 08/01/2020 0742   CO2 22 08/01/2020 0742   GLUCOSE 126 (H) 08/01/2020 0742   BUN 10 08/01/2020 0742   BUN 10 07/04/2017 0952   CREATININE 0.80 08/01/2020  0742   CREATININE 0.81 03/23/2015 0946   CALCIUM 10.0 08/01/2020 0742   PROT 7.3 08/01/2020 0742   ALBUMIN 3.5 08/01/2020 0742   AST 15 08/01/2020 0742   ALT 9 08/01/2020 0742   ALKPHOS 94 08/01/2020 0742   BILITOT 0.3 08/01/2020 0742   GFRNONAA >60 08/01/2020 0742   GFRNONAA 78 03/23/2015 0946   GFRAA >60 08/01/2020 0742   GFRAA >89 03/23/2015 0946    No results found for: SPEP, UPEP  Lab Results  Component Value Date   WBC 5.5 08/01/2020   NEUTROABS 4.7 08/01/2020   HGB 11.5 (L) 08/01/2020   HCT 36.0 08/01/2020   MCV 76.9 (L) 08/01/2020   PLT 193 08/01/2020      Chemistry      Component Value Date/Time   NA 137 08/01/2020 0742   NA 140 07/04/2017 0952   K 4.0 08/01/2020 0742   CL 105 08/01/2020 0742   CO2 22 08/01/2020 0742   BUN 10 08/01/2020 0742   BUN 10 07/04/2017 0952   CREATININE 0.80 08/01/2020 0742   CREATININE 0.81 03/23/2015 0946      Component Value Date/Time   CALCIUM 10.0 08/01/2020 0742   ALKPHOS 94 08/01/2020 0742   AST 15 08/01/2020 0742   ALT 9 08/01/2020 0742   BILITOT 0.3 08/01/2020 0742       RADIOGRAPHIC STUDIES: I have reviewed multiple imaging studies with the patient I have personally  reviewed the radiological images as listed and agreed with the findings in the report. CT CHEST W CONTRAST  Result Date: 07/28/2020 CLINICAL DATA:  Restaging endometrial cancer status post TAHBSO EXAM: CT CHEST, ABDOMEN, AND PELVIS WITH CONTRAST TECHNIQUE: Multidetector CT imaging of the chest, abdomen and pelvis was performed following the standard protocol during bolus administration of intravenous contrast. CONTRAST:  169m OMNIPAQUE IOHEXOL 300 MG/ML  SOLN COMPARISON:  CT cap 10/15/2019 and CT AP 05/10/2020. FINDINGS: CT CHEST FINDINGS Cardiovascular: Heart size appears normal. Aortic atherosclerosis. No pericardial effusion. Mild atherosclerotic calcifications identified involving the LAD coronary artery. Mediastinum/Nodes: No enlarged mediastinal, hilar, or axillary lymph nodes. Thyroid gland, trachea, and esophagus demonstrate no significant findings. Lungs/Pleura: No pleural effusion, airspace consolidation or atelectasis. No suspicious pulmonary nodule or mass identified. Musculoskeletal: Spondylosis identified within the thoracic spine. No acute or suspicious osseous findings. CT ABDOMEN PELVIS FINDINGS Hepatobiliary: Scattered low-attenuation foci are identified within the liver and appear unchanged. These are favored to represent small liver cysts. No suspicious liver lesion identified. Previous cholecystectomy. No significant biliary dilatation. Pancreas: Unremarkable. No pancreatic ductal dilatation or surrounding inflammatory changes. Spleen: Normal in size without focal abnormality. Adrenals/Urinary Tract: Normal appearance of the adrenal glands. Small low-attenuation lesion in the right kidney measures 6 mm and is favored to represent a simple cyst. Indeterminate and slightly exophytic lesion arising from the inferior pole of left kidney measures 0.8 cm, image 63/2. This is technically too small to reliably characterize. No hydronephrosis identified. Urinary bladder is unremarkable.  Stomach/Bowel: Stomach is within normal limits. Appendix appears normal. No evidence of bowel wall thickening, distention, or inflammatory changes. Sigmoid diverticulosis. No acute inflammation. Vascular/Lymphatic: Aortic atherosclerosis. No aneurysm. No abdominopelvic adenopathy. Reproductive: Status post hysterectomy. No adnexal masses. Other: No free fluid or fluid collections identified within the abdomen or pelvis. No signs of peritoneal disease Musculoskeletal: Spondylosis identified within the lumbar spine. No acute or suspicious osseous findings. IMPRESSION: 1. No findings identified to suggest recurrent or metastatic disease. 2. Indeterminate and slightly exophytic lesion arising from the inferior pole  of left kidney measures 0.8 cm. Technically this is too small to reliably characterize. Attention on follow-up imaging is advised. 3. Aortic atherosclerosis and LAD coronary artery atherosclerotic calcifications. Aortic Atherosclerosis (ICD10-I70.0). Electronically Signed   By: Kerby Moors M.D.   On: 07/28/2020 15:06   CT ABDOMEN PELVIS W CONTRAST  Result Date: 07/28/2020 CLINICAL DATA:  Restaging endometrial cancer status post TAHBSO EXAM: CT CHEST, ABDOMEN, AND PELVIS WITH CONTRAST TECHNIQUE: Multidetector CT imaging of the chest, abdomen and pelvis was performed following the standard protocol during bolus administration of intravenous contrast. CONTRAST:  171m OMNIPAQUE IOHEXOL 300 MG/ML  SOLN COMPARISON:  CT cap 10/15/2019 and CT AP 05/10/2020. FINDINGS: CT CHEST FINDINGS Cardiovascular: Heart size appears normal. Aortic atherosclerosis. No pericardial effusion. Mild atherosclerotic calcifications identified involving the LAD coronary artery. Mediastinum/Nodes: No enlarged mediastinal, hilar, or axillary lymph nodes. Thyroid gland, trachea, and esophagus demonstrate no significant findings. Lungs/Pleura: No pleural effusion, airspace consolidation or atelectasis. No suspicious pulmonary nodule or  mass identified. Musculoskeletal: Spondylosis identified within the thoracic spine. No acute or suspicious osseous findings. CT ABDOMEN PELVIS FINDINGS Hepatobiliary: Scattered low-attenuation foci are identified within the liver and appear unchanged. These are favored to represent small liver cysts. No suspicious liver lesion identified. Previous cholecystectomy. No significant biliary dilatation. Pancreas: Unremarkable. No pancreatic ductal dilatation or surrounding inflammatory changes. Spleen: Normal in size without focal abnormality. Adrenals/Urinary Tract: Normal appearance of the adrenal glands. Small low-attenuation lesion in the right kidney measures 6 mm and is favored to represent a simple cyst. Indeterminate and slightly exophytic lesion arising from the inferior pole of left kidney measures 0.8 cm, image 63/2. This is technically too small to reliably characterize. No hydronephrosis identified. Urinary bladder is unremarkable. Stomach/Bowel: Stomach is within normal limits. Appendix appears normal. No evidence of bowel wall thickening, distention, or inflammatory changes. Sigmoid diverticulosis. No acute inflammation. Vascular/Lymphatic: Aortic atherosclerosis. No aneurysm. No abdominopelvic adenopathy. Reproductive: Status post hysterectomy. No adnexal masses. Other: No free fluid or fluid collections identified within the abdomen or pelvis. No signs of peritoneal disease Musculoskeletal: Spondylosis identified within the lumbar spine. No acute or suspicious osseous findings. IMPRESSION: 1. No findings identified to suggest recurrent or metastatic disease. 2. Indeterminate and slightly exophytic lesion arising from the inferior pole of left kidney measures 0.8 cm. Technically this is too small to reliably characterize. Attention on follow-up imaging is advised. 3. Aortic atherosclerosis and LAD coronary artery atherosclerotic calcifications. Aortic Atherosclerosis (ICD10-I70.0). Electronically Signed    By: TKerby MoorsM.D.   On: 07/28/2020 15:06   DG Chest Port 1 View  Result Date: 07/09/2020 CLINICAL DATA:  Near syncope.  History of uterine carcinoma EXAM: PORTABLE CHEST 1 VIEW COMPARISON:  February 11, 2019 FINDINGS: Port-A-Cath tip is at the cavoatrial junction. No pneumothorax. The lungs are clear. The heart size and pulmonary vascularity are within normal limits. No adenopathy. There is aortic atherosclerosis. No bone lesions. IMPRESSION: Port-A-Cath tip at cavoatrial junction. No pneumothorax. Lungs clear. Heart size within normal limits. Aortic Atherosclerosis (ICD10-I70.0). Electronically Signed   By: WLowella GripIII M.D.   On: 07/09/2020 14:58

## 2020-08-01 NOTE — Assessment & Plan Note (Signed)
I reviewed multiple imaging studies with the patient She has near complete response to therapy I recommend we proceed with a few more cycles of treatment rather than stopping and she is in agreement

## 2020-08-03 ENCOUNTER — Telehealth: Payer: Self-pay | Admitting: *Deleted

## 2020-08-03 NOTE — Telephone Encounter (Signed)
-----   Message from [name], MD sent at 08/03/2020  9:48 AM EDT ----- 25 mg OTC dose two to 3 times as needed is ok ----- Message ----- From: Merril Abbe, LPN Sent: 2/0/9198   9:27 AM EDT To: [name], MD  Pt believes she had an allergic reaction from chemo Friday. She's used topical benadryl which worked temporarily. Want's to know if she can take oral benadryl?

## 2020-08-03 NOTE — Telephone Encounter (Signed)
Called pt back with below message. Pt verbalized understanding

## 2020-08-08 ENCOUNTER — Ambulatory Visit
Admission: RE | Admit: 2020-08-08 | Discharge: 2020-08-08 | Disposition: A | Discharge status: Home / Self Care | Payer: Medicare Other | Source: Ambulatory Visit | Attending: Internal Medicine | Admitting: Internal Medicine

## 2020-08-08 DIAGNOSIS — Z1231 Encounter for screening mammogram for malignant neoplasm of breast: Secondary | ICD-10-CM | POA: Diagnosis not present

## 2020-08-09 ENCOUNTER — Other Ambulatory Visit: Payer: Self-pay | Admitting: Internal Medicine

## 2020-08-09 DIAGNOSIS — R928 Other abnormal and inconclusive findings on diagnostic imaging of breast: Secondary | ICD-10-CM

## 2020-08-15 MED FILL — DEXAMETHASONE 4 MG TABLET: 4 | 21 days supply | Qty: 4 | Fill #3

## 2020-08-16 ENCOUNTER — Other Ambulatory Visit: Payer: Self-pay

## 2020-08-16 ENCOUNTER — Ambulatory Visit (INDEPENDENT_AMBULATORY_CARE_PROVIDER_SITE_OTHER): Payer: Medicare Other | Admitting: Internal Medicine

## 2020-08-16 ENCOUNTER — Other Ambulatory Visit: Payer: Medicare Other

## 2020-08-16 ENCOUNTER — Ambulatory Visit
Admission: RE | Admit: 2020-08-16 | Discharge: 2020-08-16 | Disposition: A | Discharge status: Home / Self Care | Payer: Medicare Other | Source: Ambulatory Visit | Attending: Internal Medicine | Admitting: Internal Medicine

## 2020-08-16 ENCOUNTER — Encounter: Payer: Self-pay | Admitting: Internal Medicine

## 2020-08-16 VITALS — BP 140/90 | HR 84 | Temp 98.2°F | Wt 214.8 lb

## 2020-08-16 DIAGNOSIS — L299 Pruritus, unspecified: Secondary | ICD-10-CM

## 2020-08-16 DIAGNOSIS — R928 Other abnormal and inconclusive findings on diagnostic imaging of breast: Secondary | ICD-10-CM

## 2020-08-16 DIAGNOSIS — I10 Essential (primary) hypertension: Secondary | ICD-10-CM | POA: Diagnosis not present

## 2020-08-16 DIAGNOSIS — N6002 Solitary cyst of left breast: Secondary | ICD-10-CM | POA: Diagnosis not present

## 2020-08-16 DIAGNOSIS — Z20822 Contact with and (suspected) exposure to covid-19: Secondary | ICD-10-CM

## 2020-08-16 NOTE — Progress Notes (Signed)
Established Patient Office Visit     This visit occurred during the SARS-CoV-2 public health emergency.  Safety protocols were in place, including screening questions prior to the visit, additional usage of staff PPE, and extensive cleaning of exam room while observing appropriate contact time as indicated for disinfecting solutions.    CC/Reason for Visit: Blood pressure follow-up  HPI: Tina Stone is a 70 y.o. female who is coming in today for the above mentioned reasons. Past Medical History is significant for: Hypertension that has been well controlled on chlorthalidone, GERD not on PPI therapy, hyperlipidemia not on statins due to history of intolerance with myalgias, history of endometrial cancer.   She had been on 6.25 mg of chlorthalidone for hypertension.  We decided to stop this at last visit and have her return today for follow-up blood pressures.  She does not bring her blood pressure log today, however tells me that her blood pressure has been in the upper 130s over 80s consistently at home.  She has also been having generalized pruritus.  No discernible rash.  She has not tried any new medications, she did just recently change her laundry detergent.  She wonders if it could be a chemotherapy reaction.  She has Benadryl but does not like using it as it causes extreme drowsiness.   Past Medical/Surgical History: Past Medical History:  Diagnosis Date  . Adenomatous colon polyp   . Allergic rhinitis, seasonal   . Allergy   . Beta thalassemia trait 11/25/2013  . Cholelithiasis   . Class 3 obesity without serious comorbidity with body mass index (BMI) of 40.0 to 44.9 in adult 11/19/2012  . endometrial ca dx'd 08/2009   endometrial   . GERD (gastroesophageal reflux disease)   . HLD (hyperlipidemia)   . Hypercholesterolemia   . Hypertension 03/18/2017   no meds   . Intraductal papilloma of left breast    Patient underwent left needle-localized lumpectomy by Dr. Imogene Burn. Tsuei on 09/09/2013; pathology showed intraductal papilloma with no atypia or malignancy identified.  Marland Kitchen PONV (postoperative nausea and vomiting)   . Pre-diabetes    pt denies  . Uterine fibroid     Past Surgical History:  Procedure Laterality Date  . [place]   right  . BREAST EXCISIONAL BIOPSY Left 2014   benign  . BREAST LUMPECTOMY WITH NEEDLE LOCALIZATION Left 09/09/2013   Procedure: BREAST LUMPECTOMY WITH NEEDLE LOCALIZATION;  Surgeon: Imogene Burn. Georgette Dover, MD;  Location: [place];  Service: General;  Laterality: Left;  . CHOLECYSTECTOMY    . COLONOSCOPY    . IR IMAGING GUIDED PORT INSERTION  05/18/2020  . POLYPECTOMY    . ROBOTIC ASSISTED LAPAROSCOPIC CHOLECYSTECTOMY  09/09/2019  . ROBOTIC ASSISTED TOTAL HYSTERECTOMY WITH BILATERAL SALPINGO OOPHERECTOMY N/A 10/22/2019   Procedure: XI ROBOTIC ASSISTED TOTAL HYSTERECTOMY WITH BILATERAL SALPINGO OOPHORECTOMY GREATER THAN 250 GRAMS, MINI LAPARTOMY FOR SPECIMEN DELIVERY; PELVIC AND PERI-AORTIC LYMPHADENECTOMY;  Surgeon: Everitt Amber, MD;  Location: [facility];  Service: Gynecology;  Laterality: N/A;  . SENTINEL NODE BIOPSY N/A 10/22/2019   Procedure: SENTINEL NODE BIOPSY;  Surgeon: Everitt Amber, MD;  Location: [facility];  Service: Gynecology;  Laterality: N/A;    Social History:  reports that she has never smoked. She has never used smokeless tobacco. She reports that she does not drink alcohol and does not use drugs.  Allergies: Allergies  Allergen Reactions  . Benicar Hct [Olmesartan Medoxomil-Hctz] Shortness Of Breath and Palpitations  . Bactrim [  Sulfamethoxazole-Trimethoprim] Other (See Comments)    Abdominal pain, dizziness  . Pravastatin Other (See Comments)    Lower abdominal pain  . Shellfish Allergy Nausea And Vomiting  . Amlodipine Palpitations  . Iodinated Diagnostic Agents Itching    Developed itching and hives after injection on 05/10/20; needs 13hr prep in future    Family History:  Family History    Problem Relation Age of Onset  . Diabetes Mother   . Hypertension Mother   . Colon polyps Mother 32  . Dementia Mother 64  . Diabetes Father   . Congestive Heart Failure Father   . Pancreatic cancer Paternal Aunt   . Colon cancer Neg Hx   . Breast cancer Neg Hx   . Lung cancer Neg Hx   . Esophageal cancer Neg Hx   . Rectal cancer Neg Hx   . Stomach cancer Neg Hx      Current Outpatient Medications:  .  acetaminophen (TYLENOL) 500 MG tablet, Take 1,000 mg by mouth every 6 (six) hours as needed for moderate pain., Disp: , Rfl:  .  APPLE CIDER VINEGAR PO, Take 5 mLs by mouth daily. , Disp: , Rfl:  .  b complex vitamins tablet, Take 1 tablet by mouth daily., Disp: , Rfl:  .  Blood Pressure Monitoring (BLOOD PRESSURE KIT) DEVI, 1 Units by Does not apply route every morning., Disp: 1 each, Rfl: 0 .  calcium-vitamin D (OSCAL WITH D) 500-200 MG-UNIT tablet, Take 1 tablet by mouth., Disp: , Rfl:  .  dexamethasone (DECADRON) 4 MG tablet, Take 2 tabs at the night before and 2 tab the morning of chemotherapy, every 3 weeks, by mouth (Patient taking differently: Take 4 mg by mouth See admin instructions. Take 2 tabs at the night before chemotherapy.), Disp: 20 tablet, Rfl: 6 .  Multiple Vitamin (MULTIVITAMIN) capsule, Take 1 capsule by mouth daily., Disp: , Rfl:  .  ondansetron (ZOFRAN) 8 MG tablet, Take 1 tablet (8 mg total) by mouth every 8 (eight) hours as needed. Start on the third day after chemotherapy., Disp: 30 tablet, Rfl: 1 .  polyethylene glycol (MIRALAX / GLYCOLAX) 17 g packet, Take 17 g by mouth daily as needed for mild constipation., Disp: , Rfl:  .  predniSONE (DELTASONE) 50 MG tablet, Take 1 pill at 13 hours, 7 hours and 1 hour before CT scan., Disp: 3 tablet, Rfl: 0 .  prochlorperazine (COMPAZINE) 10 MG tablet, Take 1 tablet (10 mg total) by mouth every 6 (six) hours as needed (Nausea or vomiting)., Disp: 30 tablet, Rfl: 1  Review of Systems:  Constitutional: Denies fever,  chills, diaphoresis, appetite change and fatigue.  HEENT: Denies photophobia, eye pain, redness, hearing loss, ear pain, congestion, sore throat, rhinorrhea, sneezing, mouth sores, trouble swallowing, neck pain, neck stiffness and tinnitus.   Respiratory: Denies SOB, DOE, cough, chest tightness,  and wheezing.   Cardiovascular: Denies chest pain, palpitations and leg swelling.  Gastrointestinal: Denies nausea, vomiting, abdominal pain, diarrhea, constipation, blood in stool and abdominal distention.  Genitourinary: Denies dysuria, urgency, frequency, hematuria, flank pain and difficulty urinating.  Endocrine: Denies: hot or cold intolerance, sweats, changes in hair or nails, polyuria, polydipsia. Musculoskeletal: Denies myalgias, back pain, joint swelling, arthralgias and gait problem.  Skin: Denies pallor, rash and wound.  Neurological: Denies dizziness, seizures, syncope, weakness, light-headedness, numbness and headaches.  Hematological: Denies adenopathy. Easy bruising, personal or family bleeding history  Psychiatric/Behavioral: Denies suicidal ideation, mood changes, confusion, nervousness, sleep disturbance and agitation  Physical Exam: Vitals:   08/16/20 0721  BP: 140/90  Pulse: 84  Temp: 98.2 F (36.8 C)  TempSrc: Oral  SpO2: 99%  Weight: 214 lb 12.8 oz (97.4 kg)    Body mass index is 38.05 kg/m.   Constitutional: NAD, calm, comfortable Eyes: PERRL, lids and conjunctivae normal ENMT: Mucous membranes are moist.  Respiratory: clear to auscultation bilaterally, no wheezing, no crackles. Normal respiratory effort. No accessory muscle use.  Cardiovascular: Regular rate and rhythm, no murmurs / rubs / gallops. No extremity edema. Skin: no rashes, lesions, ulcers. No induration Neurologic: Grossly intact and nonfocal Psychiatric: Normal judgment and insight. Alert and oriented x 3. Normal mood.    Impression and Plan:  Essential hypertension -Blood pressure is above  goal today at 140/90, she is hesitant to start any medications at this time. -I will allow her another 6 weeks of ambulatory monitoring at which time she will follow-up for blood pressure check.  Pruritus -No discernible rash on exam today. -Unclear if related to her chemotherapy and have advised her to discuss this with her oncologist, however this is not a new drug regimen for her so seems unlikely. -We discussed possibility of contact dermatitis due to her recent change in laundry detergent, she will switch to a free and clear detergent. -She can continue to use Benadryl tablets at night and during the day have advised topical Benadryl solution.     Lelon Frohlich, MD [facility]

## 2020-08-17 ENCOUNTER — Encounter: Payer: Self-pay | Admitting: Hematology and Oncology

## 2020-08-17 ENCOUNTER — Telehealth: Payer: Self-pay

## 2020-08-17 ENCOUNTER — Other Ambulatory Visit: Payer: Self-pay

## 2020-08-17 ENCOUNTER — Inpatient Hospital Stay (HOSPITAL_BASED_OUTPATIENT_CLINIC_OR_DEPARTMENT_OTHER): Payer: Medicare Other | Admitting: Hematology and Oncology

## 2020-08-17 DIAGNOSIS — R21 Rash and other nonspecific skin eruption: Secondary | ICD-10-CM | POA: Diagnosis not present

## 2020-08-17 DIAGNOSIS — Z79899 Other long term (current) drug therapy: Secondary | ICD-10-CM | POA: Diagnosis not present

## 2020-08-17 DIAGNOSIS — Z5111 Encounter for antineoplastic chemotherapy: Secondary | ICD-10-CM | POA: Diagnosis not present

## 2020-08-17 DIAGNOSIS — C787 Secondary malignant neoplasm of liver and intrahepatic bile duct: Secondary | ICD-10-CM | POA: Diagnosis not present

## 2020-08-17 LAB — SARS-COV-2, NAA 2 DAY TAT

## 2020-08-17 LAB — NOVEL CORONAVIRUS, NAA: SARS-CoV-2, NAA: NOT DETECTED

## 2020-08-17 MED ORDER — PREDNISONE 20 MG PO TABS: 20.0000 mg | ORAL_TABLET | Freq: Every day | ORAL | 0 refills | Status: DC

## 2020-08-17 MED FILL — predniSONE 20 MG TABS: 20 | 5 days supply | Qty: 5 | Fill #0

## 2020-08-17 NOTE — Progress Notes (Signed)
[facility] OFFICE PROGRESS NOTE  Patient Care Team: Isaac Bliss, Rayford Halsted, MD as PCP - General (Internal Medicine) Axel Filler, MD  ASSESSMENT & PLAN:  Skin rash She has very fine maculopapular rash since her recent CT imaging I suspect this is lingering side effects from recent contrast Recommend she continues taking Benadryl, Pepcid and I recommend a short course of prednisone therapy We discussed the risk, benefits, side effects of prednisone and she is in agreement I will see her next week for further follow-up   No orders of the defined types were placed in this encounter.   All questions were answered. The patient knows to call the clinic with any problems, questions or concerns. The total time spent in the appointment was 15 minutes encounter with patients including review of chart and various tests results, discussions about plan of care and coordination of care plan   [name], MD 08/17/2020 12:51 PM  INTERVAL HISTORY: Please see below for problem oriented charting. She is seen urgent due to diffuse skin rash since her last visit It is causing intense itching It is not better with oral and topical benadryl  SUMMARY OF ONCOLOGIC HISTORY: Oncology History Overview Note  Poorly differentiated carcinoma, mixed histology with squamous differentiation, rare focus of clear cells as well as serous features MSI stable Her2 negative   Endometrial cancer ([place])  09/01/2019 Initial Diagnosis   The patient reported a history of postmenopausal bleeding that began 1 to 2 months before diagnosis   09/14/2019 Imaging   US pelvis 1. Enlarged uterus with numerous myometrial masses, presumably fibroids. 2. Endometrial thickness of 6.2 mm. In the setting of post-menopausal bleeding, endometrial sampling is indicated to exclude carcinoma. If results are benign, sonohysterogram should be considered for focal lesion work-up.  3. Nonvisualized ovaries    09/23/2019 Pathology Results   A. ENDOMETRIUM, BIOPSY:  - Poorly differentiated carcinoma   10/15/2019 Imaging   Ct scan of chest, abdomen and pelvis: No evidence of metastatic disease within the chest, abdomen, or pelvis.   Enlarged fibroid uterus.   Colonic diverticulosis. No radiographic evidence of diverticulitis.   Aortic and coronary artery atherosclerosis.     10/22/2019 Pathology Results   SURGICAL PATHOLOGY   FINAL MICROSCOPIC DIAGNOSIS:   A. UTERUS, BILATERAL TUBES AND OVARIES, HYSTERECTOMY:  Poorly differentiated carcinoma, 6.5 cm.  Lymphovascular involvement by tumor.  Carcinoma involves inner half of the myometrium.  Margins not involved.  Cervix, bilateral fallopian tubes and bilateral ovaries free of tumor.   B. LYMPH NODE, RIGHT EXTERNAL SENTINEL, BIOPSY:  One lymph node with no metastatic carcinoma (0/1).   C. LYMPH NODE, RIGHT PELVIC, BIOPSY  Five lymph nodes with no metastatic carcinoma (0/5).   D. LYMPH NODE, LEFT PELVIC, BIOPSY:  Five lymph nodes with no metastatic carcinoma (0/5).   E. LYMPH NODE, RIGHT PERI AORTIC, BIOPSY:  One lymph node with no metastatic carcinoma (0/1).   F. LYMPH NODE, LEFT PERI AORTIC, BIOPSY:  Five lymph nodes with no metastatic carcinoma (0/5).    ONCOLOGY TABLE:  UTERUS, CARCINOMA OR CARCINOSARCOMA   Procedure: Total hysterectomy with bilateral f-oophorectomy and sentinel  lymph nodes.  Histologic type: Poorly differentiated carcinoma, see comment.  Histologic Grade: High-grade, FIGO 3.  Myometrial invasion:       Depth of invasion: 13 mm       Myometrial thickness: 40 mm  Uterine Serosa Involvement: Not identified  Cervical stromal involvement: Not identified  Extent of involvement of other organs: Not  identified  Lymphovascular invasion: Present  Regional Lymph Nodes:       Examined:     17 Sentinel                               0 non-sentinel                               17 total        Lymph nodes with  metastasis: 0        Isolated tumor cells (<0.2 mm): 0        Micrometastasis:  (>0.2 mm and < 2.0 mm): 0        Macrometastasis: (>2.0 mm): 0  Representative Tumor Block: A5, A6, A7 and A8.  MMR / MSI testing: Pending  Pathologic Stage Classification (pTNM, AJCC 8th edition):  pT1a, pN0  Comments: The carcinoma is a high-grade poorly differentiated carcinoma which morphologically has predominantly serous features.  There are a few foci with squamous differentiation and a rare focus of clear cell features.  Immunohistochemistry for cytokeratin AE1/AE3 is performed on the sentinel lymph nodes and no positivity is identified.    10/22/2019 Surgery   Pre-operative Diagnosis: endometrial cancer grade 3   Post-operative Diagnosis: same,    Operation: Robotic-assisted laparoscopic total hysterectomy for uterus >250gm with bilateral salpingoophorectomy, SLN mapping, bilateral pelvic and para-aortic lymphadenectomy.   Surgeon: Donaciano Eva    Operative Findings:  : 16cm bulky fibroid uterus, normal ovaries bilaterally, no suspicious lymph nodes.     11/10/2019 Cancer Staging   Staging form: Corpus Uteri - Carcinoma and Carcinosarcoma, AJCC 8th Edition - Pathologic: Stage IVB (pT1a, pN0, cM1) - Signed by [name], MD on 05/16/2020   05/10/2020 Imaging   1. New hypodense 2.0 cm segment 4A left liver lobe mass, suspicious for hepatic metastasis. 2. New left pelvic sidewall 1.4 cm soft tissue nodule, suspicious for left internal iliac nodal metastasis. 3. New left vaginal cuff 1.6 x 1.3 cm soft tissue nodule, suspicious for recurrent tumor. 4. Aortic Atherosclerosis (ICD10-I70.0).   05/18/2020 Procedure   Successful placement of a right internal jugular approach power injectable Port-A-Cath. The catheter is ready for immediate use.   05/23/2020 -  Chemotherapy   The patient had carboplatin and taxol for chemotherapy treatment.     07/28/2020 Imaging   1. No findings identified to  suggest recurrent or metastatic disease. 2. Indeterminate and slightly exophytic lesion arising from the inferior pole of left kidney measures 0.8 cm. Technically this is too small to reliably characterize. Attention on follow-up imaging is advised. 3. Aortic atherosclerosis and LAD coronary artery atherosclerotic calcifications.     REVIEW OF SYSTEMS:   Constitutional: Denies fevers, chills or abnormal weight loss Eyes: Denies blurriness of vision Ears, nose, mouth, throat, and face: Denies mucositis or sore throat Respiratory: Denies cough, dyspnea or wheezes Cardiovascular: Denies palpitation, chest discomfort or lower extremity swelling Gastrointestinal:  Denies nausea, heartburn or change in bowel habits Lymphatics: Denies new lymphadenopathy or easy bruising Neurological:Denies numbness, tingling or new weaknesses Behavioral/Psych: Mood is stable, no new changes  All other systems were reviewed with the patient and are negative.  I have reviewed the past medical history, past surgical history, social history and family history with the patient and they are unchanged from previous note.  ALLERGIES:  is allergic to benicar hct [olmesartan medoxomil-hctz], bactrim [sulfamethoxazole-trimethoprim], pravastatin,  shellfish allergy, amlodipine, and iodinated diagnostic agents.  MEDICATIONS:  Current Outpatient Medications  Medication Sig Dispense Refill  . acetaminophen (TYLENOL) 500 MG tablet Take 1,000 mg by mouth every 6 (six) hours as needed for moderate pain.    . APPLE CIDER VINEGAR PO Take 5 mLs by mouth daily.     Marland Kitchen b complex vitamins tablet Take 1 tablet by mouth daily.    . Blood Pressure Monitoring (BLOOD PRESSURE KIT) DEVI 1 Units by Does not apply route every morning. 1 each 0  . calcium-vitamin D (OSCAL WITH D) 500-200 MG-UNIT tablet Take 1 tablet by mouth.    . dexamethasone (DECADRON) 4 MG tablet Take 2 tabs at the night before and 2 tab the morning of chemotherapy, every  3 weeks, by mouth (Patient taking differently: Take 4 mg by mouth See admin instructions. Take 2 tabs at the night before chemotherapy.) 20 tablet 6  . Multiple Vitamin (MULTIVITAMIN) capsule Take 1 capsule by mouth daily.    . ondansetron (ZOFRAN) 8 MG tablet Take 1 tablet (8 mg total) by mouth every 8 (eight) hours as needed. Start on the third day after chemotherapy. 30 tablet 1  . polyethylene glycol (MIRALAX / GLYCOLAX) 17 g packet Take 17 g by mouth daily as needed for mild constipation.    . predniSONE (DELTASONE) 20 MG tablet Take 1 tablet (20 mg total) by mouth daily with breakfast. 5 tablet 0  . prochlorperazine (COMPAZINE) 10 MG tablet Take 1 tablet (10 mg total) by mouth every 6 (six) hours as needed (Nausea or vomiting). 30 tablet 1   No current facility-administered medications for this visit.    PHYSICAL EXAMINATION: ECOG PERFORMANCE STATUS: 1 - Symptomatic but completely ambulatory  Vitals:   08/17/20 1148  BP: 127/70  Pulse: 93  Resp: 18  Temp: 98.4 F (36.9 C)  SpO2: 100%   Filed Weights   08/17/20 1148  Weight: 216 lb 3.2 oz (98.1 kg)    GENERAL:alert, no distress and comfortable SKIN: noted diffuse fine rash throughout NEURO: alert & oriented x 3 with fluent speech, no focal motor/sensory deficits  LABORATORY DATA:  I have reviewed the data as listed    Component Value Date/Time   NA 137 08/01/2020 0742   NA 140 07/04/2017 0952   K 4.0 08/01/2020 0742   CL 105 08/01/2020 0742   CO2 22 08/01/2020 0742   GLUCOSE 126 (H) 08/01/2020 0742   BUN 10 08/01/2020 0742   BUN 10 07/04/2017 0952   CREATININE 0.80 08/01/2020 0742   CREATININE 0.81 03/23/2015 0946   CALCIUM 10.0 08/01/2020 0742   PROT 7.3 08/01/2020 0742   ALBUMIN 3.5 08/01/2020 0742   AST 15 08/01/2020 0742   ALT 9 08/01/2020 0742   ALKPHOS 94 08/01/2020 0742   BILITOT 0.3 08/01/2020 0742   GFRNONAA >60 08/01/2020 0742   GFRNONAA 78 03/23/2015 0946   GFRAA >60 08/01/2020 0742   GFRAA >89  03/23/2015 0946    No results found for: SPEP, UPEP  Lab Results  Component Value Date   WBC 5.5 08/01/2020   NEUTROABS 4.7 08/01/2020   HGB 11.5 (L) 08/01/2020   HCT 36.0 08/01/2020   MCV 76.9 (L) 08/01/2020   PLT 193 08/01/2020      Chemistry      Component Value Date/Time   NA 137 08/01/2020 0742   NA 140 07/04/2017 0952   K 4.0 08/01/2020 0742   CL 105 08/01/2020 0742   CO2 22 08/01/2020 0742  BUN 10 08/01/2020 0742   BUN 10 07/04/2017 0952   CREATININE 0.80 08/01/2020 0742   CREATININE 0.81 03/23/2015 0946      Component Value Date/Time   CALCIUM 10.0 08/01/2020 0742   ALKPHOS 94 08/01/2020 0742   AST 15 08/01/2020 0742   ALT 9 08/01/2020 0742   BILITOT 0.3 08/01/2020 0742

## 2020-08-17 NOTE — Telephone Encounter (Signed)
I can see her at 36 today or 840 tomorrow morning

## 2020-08-17 NOTE — Telephone Encounter (Signed)
Called back and given below message. Scheduled at 1200 noon today with Dr. Alvy Bimler. She is aware of the time and to arrive 15 mins early.

## 2020-08-17 NOTE — Assessment & Plan Note (Signed)
She has very fine maculopapular rash since her recent CT imaging I suspect this is lingering side effects from recent contrast Recommend she continues taking Benadryl, Pepcid and I recommend a short course of prednisone therapy We discussed the risk, benefits, side effects of prednisone and she is in agreement I will see her next week for further follow-up

## 2020-08-17 NOTE — Telephone Encounter (Signed)
She called and left a message to call her.  Called back. She is still having itching and hives in different places on her body since the last treatment. She is taking the benadryl 25 mg as she can tolerate. She thought that it may be soap/detergent and has ruled that out.  She is asking what she should do about the hives? She would like a earlier appt if possible.

## 2020-08-18 ENCOUNTER — Telehealth: Payer: Self-pay | Admitting: Hematology and Oncology

## 2020-08-18 NOTE — Telephone Encounter (Signed)
Per 8/18 los, no changes made to pt schedule

## 2020-08-19 ENCOUNTER — Telehealth: Payer: Self-pay

## 2020-08-19 NOTE — Telephone Encounter (Signed)
1) Stay on Benadryl, prednisone and pepcid 2) add OTC claritin 10 mg daily

## 2020-08-19 NOTE — Telephone Encounter (Signed)
Called back and given below message. She verbalized understanding. She was not taking the benadryl and will start back. She will start the Claritin.

## 2020-08-19 NOTE — Telephone Encounter (Signed)
She called to give update. She has not had any relief since starting the prednisone and Pepcid. She said the itching has got worse. She is asking what she should do?

## 2020-08-22 ENCOUNTER — Other Ambulatory Visit: Payer: Self-pay

## 2020-08-22 ENCOUNTER — Inpatient Hospital Stay: Payer: Medicare Other

## 2020-08-22 ENCOUNTER — Inpatient Hospital Stay (HOSPITAL_BASED_OUTPATIENT_CLINIC_OR_DEPARTMENT_OTHER): Payer: Medicare Other | Admitting: Hematology and Oncology

## 2020-08-22 ENCOUNTER — Encounter: Payer: Self-pay | Admitting: Hematology and Oncology

## 2020-08-22 DIAGNOSIS — Z7189 Other specified counseling: Secondary | ICD-10-CM

## 2020-08-22 DIAGNOSIS — D563 Thalassemia minor: Secondary | ICD-10-CM | POA: Diagnosis not present

## 2020-08-22 DIAGNOSIS — C541 Malignant neoplasm of endometrium: Secondary | ICD-10-CM | POA: Diagnosis not present

## 2020-08-22 DIAGNOSIS — C787 Secondary malignant neoplasm of liver and intrahepatic bile duct: Secondary | ICD-10-CM | POA: Diagnosis not present

## 2020-08-22 DIAGNOSIS — Z5111 Encounter for antineoplastic chemotherapy: Secondary | ICD-10-CM | POA: Diagnosis not present

## 2020-08-22 DIAGNOSIS — Z79899 Other long term (current) drug therapy: Secondary | ICD-10-CM | POA: Diagnosis not present

## 2020-08-22 DIAGNOSIS — R21 Rash and other nonspecific skin eruption: Secondary | ICD-10-CM

## 2020-08-22 LAB — CBC WITH DIFFERENTIAL (CANCER CENTER ONLY)
Abs Immature Granulocytes: 0.07 10*3/uL (ref 0.00–0.07)
Basophils Absolute: 0 10*3/uL (ref 0.0–0.1)
Basophils Relative: 0 %
Eosinophils Absolute: 0 10*3/uL (ref 0.0–0.5)
Eosinophils Relative: 0 %
HCT: 34.1 % — ABNORMAL LOW (ref 36.0–46.0)
Hemoglobin: 11.1 g/dL — ABNORMAL LOW (ref 12.0–15.0)
Immature Granulocytes: 1 %
Lymphocytes Relative: 18 %
Lymphs Abs: 0.9 10*3/uL (ref 0.7–4.0)
MCH: 25.4 pg — ABNORMAL LOW (ref 26.0–34.0)
MCHC: 32.6 g/dL (ref 30.0–36.0)
MCV: 78 fL — ABNORMAL LOW (ref 80.0–100.0)
Monocytes Absolute: 0.4 10*3/uL (ref 0.1–1.0)
Monocytes Relative: 8 %
Neutro Abs: 3.7 10*3/uL (ref 1.7–7.7)
Neutrophils Relative %: 73 %
Platelet Count: 214 10*3/uL (ref 150–400)
RBC: 4.37 MIL/uL (ref 3.87–5.11)
RDW: 19.2 % — ABNORMAL HIGH (ref 11.5–15.5)
WBC Count: 5.1 10*3/uL (ref 4.0–10.5)
nRBC: 0 % (ref 0.0–0.2)

## 2020-08-22 LAB — CMP (CANCER CENTER ONLY)
ALT: 11 U/L (ref 0–44)
AST: 18 U/L (ref 15–41)
Albumin: 3.7 g/dL (ref 3.5–5.0)
Alkaline Phosphatase: 88 U/L (ref 38–126)
Anion gap: 8 (ref 5–15)
BUN: 12 mg/dL (ref 8–23)
CO2: 25 mmol/L (ref 22–32)
Calcium: 9.9 mg/dL (ref 8.9–10.3)
Chloride: 104 mmol/L (ref 98–111)
Creatinine: 0.77 mg/dL (ref 0.44–1.00)
GFR, Est AFR Am: >60 mL/min (ref >60)
GFR, Estimated: >60 mL/min (ref >60)
Glucose, Bld: 114 mg/dL — ABNORMAL HIGH (ref 70–99)
Potassium: 3.9 mmol/L (ref 3.5–5.1)
Sodium: 137 mmol/L (ref 135–145)
Total Bilirubin: 0.3 mg/dL (ref 0.3–1.2)
Total Protein: 7.2 g/dL (ref 6.5–8.1)

## 2020-08-22 MED ORDER — SODIUM CHLORIDE 0.9 % IV SOLN
175.0000 mg/m2 | Freq: Once | INTRAVENOUS | Status: AC
  Administered 2020-08-22: 312 mg via INTRAVENOUS
  Filled 2020-08-22: qty 52

## 2020-08-22 MED ORDER — FAMOTIDINE IN NACL 20-0.9 MG/50ML-% IV SOLN
INTRAVENOUS | Status: AC
  Filled 2020-08-22: qty 50

## 2020-08-22 MED ORDER — SODIUM CHLORIDE 0.9 % IV SOLN
150.0000 mg | Freq: Once | INTRAVENOUS | Status: AC
  Administered 2020-08-22: 150 mg via INTRAVENOUS
  Filled 2020-08-22: qty 150

## 2020-08-22 MED ORDER — PALONOSETRON HCL INJECTION 0.25 MG/5ML
INTRAVENOUS | Status: AC
  Filled 2020-08-22: qty 5

## 2020-08-22 MED ORDER — PALONOSETRON HCL INJECTION 0.25 MG/5ML
0.2500 mg | Freq: Once | INTRAVENOUS | Status: AC
  Administered 2020-08-22: 0.25 mg via INTRAVENOUS

## 2020-08-22 MED ORDER — DIPHENHYDRAMINE HCL 50 MG/ML IJ SOLN
50.0000 mg | Freq: Once | INTRAMUSCULAR | Status: AC
  Administered 2020-08-22: 50 mg via INTRAVENOUS

## 2020-08-22 MED ORDER — SODIUM CHLORIDE 0.9% FLUSH
10.0000 mL | Freq: Once | INTRAVENOUS | Status: AC
  Administered 2020-08-22: 10 mL
  Filled 2020-08-22: qty 10

## 2020-08-22 MED ORDER — SODIUM CHLORIDE 0.9 % IV SOLN
514.8000 mg | Freq: Once | INTRAVENOUS | Status: AC
  Administered 2020-08-22: 510 mg via INTRAVENOUS
  Filled 2020-08-22: qty 51

## 2020-08-22 MED ORDER — SODIUM CHLORIDE 0.9 % IV SOLN
10.0000 mg | Freq: Once | INTRAVENOUS | Status: AC
  Administered 2020-08-22: 10 mg via INTRAVENOUS
  Filled 2020-08-22: qty 10

## 2020-08-22 MED ORDER — FAMOTIDINE IN NACL 20-0.9 MG/50ML-% IV SOLN
20.0000 mg | Freq: Once | INTRAVENOUS | Status: AC
  Administered 2020-08-22: 20 mg via INTRAVENOUS

## 2020-08-22 MED ORDER — HEPARIN SOD (PORK) LOCK FLUSH 100 UNIT/ML IV SOLN
500.0000 [IU] | Freq: Once | INTRAVENOUS | Status: AC | PRN
  Administered 2020-08-22: 500 [IU]
  Filled 2020-08-22: qty 5

## 2020-08-22 MED ORDER — SODIUM CHLORIDE 0.9% FLUSH
10.0000 mL | INTRAVENOUS | Status: DC | PRN
  Administered 2020-08-22: 10 mL
  Filled 2020-08-22: qty 10

## 2020-08-22 MED ORDER — SODIUM CHLORIDE 0.9 % IV SOLN
Freq: Once | INTRAVENOUS | Status: AC
  Filled 2020-08-22: qty 250

## 2020-08-22 MED ORDER — DIPHENHYDRAMINE HCL 50 MG/ML IJ SOLN
INTRAMUSCULAR | Status: AC
  Filled 2020-08-22: qty 1

## 2020-08-22 NOTE — Assessment & Plan Note (Signed)
Her last imaging showed she has near complete response to therapy I recommend we proceed with a few more cycles of treatment rather than stopping and she is in agreement

## 2020-08-22 NOTE — Patient Instructions (Signed)
[facility] Discharge Instructions for Patients Receiving Chemotherapy  Today you received the following chemotherapy agents: Paclitaxel (Taxol) and Carboplatin.  To help prevent nausea and vomiting after your treatment, we encourage you to take your nausea medication as directed by your MD.   If you develop nausea and vomiting that is not controlled by your nausea medication, call the clinic.   BELOW ARE SYMPTOMS THAT SHOULD BE REPORTED IMMEDIATELY:  *FEVER GREATER THAN 100.5 F  *CHILLS WITH OR WITHOUT FEVER  NAUSEA AND VOMITING THAT IS NOT CONTROLLED WITH YOUR NAUSEA MEDICATION  *UNUSUAL SHORTNESS OF BREATH  *UNUSUAL BRUISING OR BLEEDING  TENDERNESS IN MOUTH AND THROAT WITH OR WITHOUT PRESENCE OF ULCERS  *URINARY PROBLEMS  *BOWEL PROBLEMS  UNUSUAL RASH Items with * indicate a potential emergency and should be followed up as soon as possible.  Feel free to call the clinic should you have any questions or concerns. The clinic phone number is [phone].  Please show the CHEMO ALERT CARD at check-in to the Emergency Department and triage nurse.    

## 2020-08-22 NOTE — Assessment & Plan Note (Signed)
She has chronic stable anemia Observe for now 

## 2020-08-22 NOTE — Assessment & Plan Note (Signed)
Her skin rash is almost resolved I felt this is related to IV contrast I plan to order future CT imaging with IV contrast She will continue Claritin as needed

## 2020-08-22 NOTE — Progress Notes (Signed)
[facility] OFFICE PROGRESS NOTE  Patient Care Team: Isaac Bliss, Rayford Halsted, MD as PCP - General (Internal Medicine) Axel Filler, MD  ASSESSMENT & PLAN:  Endometrial cancer [facility]) Her last imaging showed she has near complete response to therapy I recommend we proceed with a few more cycles of treatment rather than stopping and she is in agreement  Beta thalassemia trait She has chronic stable anemia Observe for now  Skin rash Her skin rash is almost resolved I felt this is related to IV contrast I plan to order future CT imaging with IV contrast She will continue Claritin as needed   No orders of the defined types were placed in this encounter.   All questions were answered. The patient knows to call the clinic with any problems, questions or concerns. The total time spent in the appointment was 20 minutes encounter with patients including review of chart and various tests results, discussions about plan of care and coordination of care plan   [name], MD 08/22/2020 12:12 PM  INTERVAL HISTORY: Please see below for problem oriented charting. She returns for further follow-up Her skin rash is better with Claritin No recent nausea or constipation Her appetite is fair No fever or chills  SUMMARY OF ONCOLOGIC HISTORY: Oncology History Overview Note  Poorly differentiated carcinoma, mixed histology with squamous differentiation, rare focus of clear cells as well as serous features MSI stable Her2 negative   Endometrial cancer ([place])  09/01/2019 Initial Diagnosis   The patient reported a history of postmenopausal bleeding that began 1 to 2 months before diagnosis   09/14/2019 Imaging   US pelvis 1. Enlarged uterus with numerous myometrial masses, presumably fibroids. 2. Endometrial thickness of 6.2 mm. In the setting of post-menopausal bleeding, endometrial sampling is indicated to exclude carcinoma. If results are benign, sonohysterogram  should be considered for focal lesion work-up.  3. Nonvisualized ovaries   09/23/2019 Pathology Results   A. ENDOMETRIUM, BIOPSY:  - Poorly differentiated carcinoma   10/15/2019 Imaging   Ct scan of chest, abdomen and pelvis: No evidence of metastatic disease within the chest, abdomen, or pelvis.   Enlarged fibroid uterus.   Colonic diverticulosis. No radiographic evidence of diverticulitis.   Aortic and coronary artery atherosclerosis.     10/22/2019 Pathology Results   SURGICAL PATHOLOGY   FINAL MICROSCOPIC DIAGNOSIS:   A. UTERUS, BILATERAL TUBES AND OVARIES, HYSTERECTOMY:  Poorly differentiated carcinoma, 6.5 cm.  Lymphovascular involvement by tumor.  Carcinoma involves inner half of the myometrium.  Margins not involved.  Cervix, bilateral fallopian tubes and bilateral ovaries free of tumor.   B. LYMPH NODE, RIGHT EXTERNAL SENTINEL, BIOPSY:  One lymph node with no metastatic carcinoma (0/1).   C. LYMPH NODE, RIGHT PELVIC, BIOPSY  Five lymph nodes with no metastatic carcinoma (0/5).   D. LYMPH NODE, LEFT PELVIC, BIOPSY:  Five lymph nodes with no metastatic carcinoma (0/5).   E. LYMPH NODE, RIGHT PERI AORTIC, BIOPSY:  One lymph node with no metastatic carcinoma (0/1).   F. LYMPH NODE, LEFT PERI AORTIC, BIOPSY:  Five lymph nodes with no metastatic carcinoma (0/5).    ONCOLOGY TABLE:  UTERUS, CARCINOMA OR CARCINOSARCOMA   Procedure: Total hysterectomy with bilateral f-oophorectomy and sentinel  lymph nodes.  Histologic type: Poorly differentiated carcinoma, see comment.  Histologic Grade: High-grade, FIGO 3.  Myometrial invasion:       Depth of invasion: 13 mm       Myometrial thickness: 40 mm  Uterine Serosa Involvement: Not  identified  Cervical stromal involvement: Not identified  Extent of involvement of other organs: Not identified  Lymphovascular invasion: Present  Regional Lymph Nodes:       Examined:     17 Sentinel                               0  non-sentinel                               17 total        Lymph nodes with metastasis: 0        Isolated tumor cells (<0.2 mm): 0        Micrometastasis:  (>0.2 mm and < 2.0 mm): 0        Macrometastasis: (>2.0 mm): 0  Representative Tumor Block: A5, A6, A7 and A8.  MMR / MSI testing: Pending  Pathologic Stage Classification (pTNM, AJCC 8th edition):  pT1a, pN0  Comments: The carcinoma is a high-grade poorly differentiated carcinoma which morphologically has predominantly serous features.  There are a few foci with squamous differentiation and a rare focus of clear cell features.  Immunohistochemistry for cytokeratin AE1/AE3 is performed on the sentinel lymph nodes and no positivity is identified.    10/22/2019 Surgery   Pre-operative Diagnosis: endometrial cancer grade 3   Post-operative Diagnosis: same,    Operation: Robotic-assisted laparoscopic total hysterectomy for uterus >250gm with bilateral salpingoophorectomy, SLN mapping, bilateral pelvic and para-aortic lymphadenectomy.   Surgeon: Donaciano Eva    Operative Findings:  : 16cm bulky fibroid uterus, normal ovaries bilaterally, no suspicious lymph nodes.     11/10/2019 Cancer Staging   Staging form: Corpus Uteri - Carcinoma and Carcinosarcoma, AJCC 8th Edition - Pathologic: Stage IVB (pT1a, pN0, cM1) - Signed by [name], MD on 05/16/2020   05/10/2020 Imaging   1. New hypodense 2.0 cm segment 4A left liver lobe mass, suspicious for hepatic metastasis. 2. New left pelvic sidewall 1.4 cm soft tissue nodule, suspicious for left internal iliac nodal metastasis. 3. New left vaginal cuff 1.6 x 1.3 cm soft tissue nodule, suspicious for recurrent tumor. 4. Aortic Atherosclerosis (ICD10-I70.0).   05/18/2020 Procedure   Successful placement of a right internal jugular approach power injectable Port-A-Cath. The catheter is ready for immediate use.   05/23/2020 -  Chemotherapy   The patient had carboplatin and taxol for  chemotherapy treatment.     07/28/2020 Imaging   1. No findings identified to suggest recurrent or metastatic disease. 2. Indeterminate and slightly exophytic lesion arising from the inferior pole of left kidney measures 0.8 cm. Technically this is too small to reliably characterize. Attention on follow-up imaging is advised. 3. Aortic atherosclerosis and LAD coronary artery atherosclerotic calcifications.     REVIEW OF SYSTEMS:   Constitutional: Denies fevers, chills or abnormal weight loss Eyes: Denies blurriness of vision Ears, nose, mouth, throat, and face: Denies mucositis or sore throat Respiratory: Denies cough, dyspnea or wheezes Cardiovascular: Denies palpitation, chest discomfort or lower extremity swelling Gastrointestinal:  Denies nausea, heartburn or change in bowel habits Lymphatics: Denies new lymphadenopathy or easy bruising Neurological:Denies numbness, tingling or new weaknesses Behavioral/Psych: Mood is stable, no new changes  All other systems were reviewed with the patient and are negative.  I have reviewed the past medical history, past surgical history, social history and family history with the patient and they are unchanged from  previous note.  ALLERGIES:  is allergic to benicar hct [olmesartan medoxomil-hctz], bactrim [sulfamethoxazole-trimethoprim], pravastatin, shellfish allergy, amlodipine, and iodinated diagnostic agents.  MEDICATIONS:  Current Outpatient Medications  Medication Sig Dispense Refill  . acetaminophen (TYLENOL) 500 MG tablet Take 1,000 mg by mouth every 6 (six) hours as needed for moderate pain.    . APPLE CIDER VINEGAR PO Take 5 mLs by mouth daily.     Marland Kitchen b complex vitamins tablet Take 1 tablet by mouth daily.    . Blood Pressure Monitoring (BLOOD PRESSURE KIT) DEVI 1 Units by Does not apply route every morning. 1 each 0  . calcium-vitamin D (OSCAL WITH D) 500-200 MG-UNIT tablet Take 1 tablet by mouth.    . dexamethasone (DECADRON) 4 MG  tablet Take 2 tabs at the night before and 2 tab the morning of chemotherapy, every 3 weeks, by mouth (Patient taking differently: Take 4 mg by mouth See admin instructions. Take 2 tabs at the night before chemotherapy.) 20 tablet 6  . Multiple Vitamin (MULTIVITAMIN) capsule Take 1 capsule by mouth daily.    . ondansetron (ZOFRAN) 8 MG tablet Take 1 tablet (8 mg total) by mouth every 8 (eight) hours as needed. Start on the third day after chemotherapy. 30 tablet 1  . polyethylene glycol (MIRALAX / GLYCOLAX) 17 g packet Take 17 g by mouth daily as needed for mild constipation.    . prochlorperazine (COMPAZINE) 10 MG tablet Take 1 tablet (10 mg total) by mouth every 6 (six) hours as needed (Nausea or vomiting). 30 tablet 1   No current facility-administered medications for this visit.   Facility-Administered Medications Ordered in Other Visits  Medication Dose Route Frequency Provider Last Rate Last Admin  . CARBOplatin (PARAPLATIN) 510 mg in sodium chloride 0.9 % 250 mL chemo infusion  510 mg Intravenous Once Alvy Bimler, Carnesha Maravilla, MD      . fosaprepitant (EMEND) 150 mg in sodium chloride 0.9 % 145 mL IVPB  150 mg Intravenous Once [name], MD 450 mL/hr at 08/22/20 1209 150 mg at 08/22/20 1209  . heparin lock flush 100 unit/mL  500 Units Intracatheter Once PRN Alvy Bimler, Tanishka Drolet, MD      . PACLitaxel (TAXOL) 312 mg in sodium chloride 0.9 % 500 mL chemo infusion (> 47m/m2)  175 mg/m2 (Treatment Plan Adjusted) Intravenous Once Alayna Mabe, MD      . sodium chloride flush (NS) 0.9 % injection 10 mL  10 mL Intracatheter PRN GAlvy Bimler Han Lysne, MD        PHYSICAL EXAMINATION: ECOG PERFORMANCE STATUS: 1 - Symptomatic but completely ambulatory  Vitals:   08/22/20 1101  BP: (!) 143/87  Pulse: 87  Resp: 18  Temp: 98.8 F (37.1 C)  SpO2: 100%   Filed Weights   08/22/20 1101  Weight: 215 lb (97.5 kg)    GENERAL:alert, no distress and comfortable SKIN: skin color, texture, turgor are normal, no rashes or  significant lesions EYES: normal, Conjunctiva are pink and non-injected, sclera clear OROPHARYNX:no exudate, no erythema and lips, buccal mucosa, and tongue normal  NECK: supple, thyroid normal size, non-tender, without nodularity LYMPH:  no palpable lymphadenopathy in the cervical, axillary or inguinal LUNGS: clear to auscultation and percussion with normal breathing effort HEART: regular rate & rhythm and no murmurs and no lower extremity edema ABDOMEN:abdomen soft, non-tender and normal bowel sounds Musculoskeletal:no cyanosis of digits and no clubbing  NEURO: alert & oriented x 3 with fluent speech, no focal motor/sensory deficits  LABORATORY DATA:  I have reviewed  the data as listed    Component Value Date/Time   NA 137 08/22/2020 1040   NA 140 07/04/2017 0952   K 3.9 08/22/2020 1040   CL 104 08/22/2020 1040   CO2 25 08/22/2020 1040   GLUCOSE 114 (H) 08/22/2020 1040   BUN 12 08/22/2020 1040   BUN 10 07/04/2017 0952   CREATININE 0.77 08/22/2020 1040   CREATININE 0.81 03/23/2015 0946   CALCIUM 9.9 08/22/2020 1040   PROT 7.2 08/22/2020 1040   ALBUMIN 3.7 08/22/2020 1040   AST 18 08/22/2020 1040   ALT 11 08/22/2020 1040   ALKPHOS 88 08/22/2020 1040   BILITOT 0.3 08/22/2020 1040   GFRNONAA >60 08/22/2020 1040   GFRNONAA 78 03/23/2015 0946   GFRAA >60 08/22/2020 1040   GFRAA >89 03/23/2015 0946    No results found for: SPEP, UPEP  Lab Results  Component Value Date   WBC 5.1 08/22/2020   NEUTROABS 3.7 08/22/2020   HGB 11.1 (L) 08/22/2020   HCT 34.1 (L) 08/22/2020   MCV 78.0 (L) 08/22/2020   PLT 214 08/22/2020      Chemistry      Component Value Date/Time   NA 137 08/22/2020 1040   NA 140 07/04/2017 0952   K 3.9 08/22/2020 1040   CL 104 08/22/2020 1040   CO2 25 08/22/2020 1040   BUN 12 08/22/2020 1040   BUN 10 07/04/2017 0952   CREATININE 0.77 08/22/2020 1040   CREATININE 0.81 03/23/2015 0946      Component Value Date/Time   CALCIUM 9.9 08/22/2020 1040    ALKPHOS 88 08/22/2020 1040   AST 18 08/22/2020 1040   ALT 11 08/22/2020 1040   BILITOT 0.3 08/22/2020 1040

## 2020-08-23 ENCOUNTER — Other Ambulatory Visit: Payer: Medicare Other

## 2020-08-23 ENCOUNTER — Telehealth: Payer: Self-pay | Admitting: Hematology and Oncology

## 2020-08-23 NOTE — Telephone Encounter (Signed)
Called per 8/23 sch msg - no answer left  Message for patient to call back to reschedule appt for covid booster.

## 2020-08-25 ENCOUNTER — Emergency Department (HOSPITAL_COMMUNITY): Payer: Medicare Other

## 2020-08-25 ENCOUNTER — Other Ambulatory Visit: Payer: Self-pay

## 2020-08-25 ENCOUNTER — Encounter (HOSPITAL_COMMUNITY): Payer: Self-pay | Admitting: Emergency Medicine

## 2020-08-25 ENCOUNTER — Emergency Department (HOSPITAL_COMMUNITY)
Admission: EM | Admit: 2020-08-25 | Discharge: 2020-08-26 | Disposition: A | Discharge status: Home / Self Care | Payer: Medicare Other | Attending: Emergency Medicine | Admitting: Emergency Medicine

## 2020-08-25 DIAGNOSIS — I10 Essential (primary) hypertension: Secondary | ICD-10-CM | POA: Diagnosis not present

## 2020-08-25 DIAGNOSIS — Z87891 Personal history of nicotine dependence: Secondary | ICD-10-CM | POA: Diagnosis not present

## 2020-08-25 DIAGNOSIS — M47814 Spondylosis without myelopathy or radiculopathy, thoracic region: Secondary | ICD-10-CM | POA: Diagnosis not present

## 2020-08-25 DIAGNOSIS — Z79899 Other long term (current) drug therapy: Secondary | ICD-10-CM | POA: Insufficient documentation

## 2020-08-25 DIAGNOSIS — R42 Dizziness and giddiness: Secondary | ICD-10-CM | POA: Insufficient documentation

## 2020-08-25 DIAGNOSIS — I700 Atherosclerosis of aorta: Secondary | ICD-10-CM | POA: Diagnosis not present

## 2020-08-25 LAB — CBC
HCT: 38.2 % (ref 36.0–46.0)
Hemoglobin: 12.1 g/dL (ref 12.0–15.0)
MCH: 25.4 pg — ABNORMAL LOW (ref 26.0–34.0)
MCHC: 31.7 g/dL (ref 30.0–36.0)
MCV: 80.3 fL (ref 80.0–100.0)
Platelets: 170 10*3/uL (ref 150–400)
RBC: 4.76 MIL/uL (ref 3.87–5.11)
RDW: 18.8 % — ABNORMAL HIGH (ref 11.5–15.5)
WBC: 2.7 10*3/uL — ABNORMAL LOW (ref 4.0–10.5)
nRBC: 0 % (ref 0.0–0.2)

## 2020-08-25 LAB — BASIC METABOLIC PANEL
Anion gap: 10 (ref 5–15)
BUN: 14 mg/dL (ref 8–23)
CO2: 26 mmol/L (ref 22–32)
Calcium: 9.2 mg/dL (ref 8.9–10.3)
Chloride: 102 mmol/L (ref 98–111)
Creatinine, Ser: 0.94 mg/dL (ref 0.44–1.00)
GFR calc Af Amer: >60 mL/min (ref >60)
GFR calc non Af Amer: >60 mL/min (ref >60)
Glucose, Bld: 121 mg/dL — ABNORMAL HIGH (ref 70–99)
Potassium: 4 mmol/L (ref 3.5–5.1)
Sodium: 138 mmol/L (ref 135–145)

## 2020-08-25 LAB — CBG MONITORING, ED: Glucose-Capillary: 117 mg/dL — ABNORMAL HIGH (ref 70–99)

## 2020-08-25 MED ORDER — HYDROCORTISONE NA SUCCINATE PF 250 MG IJ SOLR
200.0000 mg | Freq: Once | INTRAMUSCULAR | Status: AC
  Administered 2020-08-25: 200 mg via INTRAVENOUS
  Filled 2020-08-25: qty 200

## 2020-08-25 MED ORDER — DIPHENHYDRAMINE HCL 50 MG/ML IJ SOLN
50.0000 mg | Freq: Once | INTRAMUSCULAR | Status: AC
  Administered 2020-08-25: 50 mg via INTRAVENOUS
  Filled 2020-08-25: qty 1

## 2020-08-25 MED ORDER — LORAZEPAM 2 MG/ML IJ SOLN: 0.5000 mg | Freq: Once | INTRAMUSCULAR | Status: AC

## 2020-08-25 MED ORDER — IOHEXOL 350 MG/ML SOLN
100.0000 mL | Freq: Once | INTRAVENOUS | Status: AC | PRN
  Administered 2020-08-25: 60 mL via INTRAVENOUS

## 2020-08-25 MED ORDER — DIPHENHYDRAMINE HCL 25 MG PO CAPS: 50.0000 mg | ORAL_CAPSULE | Freq: Once | ORAL | Status: AC

## 2020-08-25 MED ORDER — LORAZEPAM 2 MG/ML IJ SOLN
INTRAMUSCULAR | Status: AC
  Administered 2020-08-25: 0.5 mg via INTRAVENOUS
  Filled 2020-08-25: qty 1

## 2020-08-25 NOTE — ED Notes (Addendum)
Port accessed. Blood return noted, flushed with ease.

## 2020-08-25 NOTE — ED Triage Notes (Signed)
Per pt, states she had chemo on Monday, since Tuesday she has felt dizzy-not sure if it is related to her chemo-states she has been hydrating herself

## 2020-08-25 NOTE — ED Provider Notes (Signed)
[facility] Provider Note   CSN: [id number] Arrival date & time: 08/25/20  1552     History Chief Complaint  Patient presents with  . Dizziness    Tina Stone is a 70 y.o. female.  Six 86-year-old female with history of cancer of the endometrium who is in remission but yet still receiving chemotherapy presents with weakness as well as increased dyspnea. Denies any fever, cough, congestion. States her dizziness does sometimes get worse when she stands up but is also at rest. Last chemotherapy was several days ago. States that she keeps up with her oral intake. No recent vomiting or diarrhea. No pleuritic chest pain. Denies any syncope or near syncope. No treatment used for this prior to arrival        Past Medical History:  Diagnosis Date  . Adenomatous colon polyp   . Allergic rhinitis, seasonal   . Allergy   . Beta thalassemia trait 11/25/2013  . Cholelithiasis   . Class 3 obesity without serious comorbidity with body mass index (BMI) of 40.0 to 44.9 in adult 11/19/2012  . endometrial ca dx'd 08/2009   endometrial   . GERD (gastroesophageal reflux disease)   . HLD (hyperlipidemia)   . Hypercholesterolemia   . Hypertension 03/18/2017   no meds   . Intraductal papilloma of left breast    Patient underwent left needle-localized lumpectomy by Dr. Imogene Burn. [place] on 09/09/2013; pathology showed intraductal papilloma with no atypia or malignancy identified.  Marland Kitchen PONV (postoperative nausea and vomiting)   . Pre-diabetes    pt denies  . Uterine fibroid     Patient Active Problem List   Diagnosis Date Noted  . Skin rash 08/17/2020  . Metastasis to liver ([place]) 06/14/2020  . Bone pain 05/27/2020  . Goals of care, counseling/discussion 05/16/2020  . Endometrial cancer ([place]) 10/22/2019  . Postmenopausal bleeding 09/02/2019  . Biliary colic 40/98/1191  . Breast pain, left 05/20/2019  . Trapezius muscle spasm 05/11/2019  . Elevated random  blood glucose level 02/13/2019  . Epigastric pain 01/29/2019  . Prediabetes 03/18/2017  . Hypertension 03/18/2017  . Beta thalassemia trait 11/25/2013  . Class II obesity 11/19/2012  . Women's annual routine gynecological examination 07/12/2011  . Gastroesophageal reflux disease 01/28/2007  . Hyperlipidemia 10/10/2006    Past Surgical History:  Procedure Laterality Date  . [place]   right  . BREAST EXCISIONAL BIOPSY Left 2014   benign  . BREAST LUMPECTOMY WITH NEEDLE LOCALIZATION Left 09/09/2013   Procedure: BREAST LUMPECTOMY WITH NEEDLE LOCALIZATION;  Surgeon: Imogene Burn. Georgette Dover, MD;  Location: [facility];  Service: General;  Laterality: Left;  . CHOLECYSTECTOMY    . COLONOSCOPY    . IR IMAGING GUIDED PORT INSERTION  05/18/2020  . POLYPECTOMY    . ROBOTIC ASSISTED LAPAROSCOPIC CHOLECYSTECTOMY  09/09/2019  . ROBOTIC ASSISTED TOTAL HYSTERECTOMY WITH BILATERAL SALPINGO OOPHERECTOMY N/A 10/22/2019   Procedure: XI ROBOTIC ASSISTED TOTAL HYSTERECTOMY WITH BILATERAL SALPINGO OOPHORECTOMY GREATER THAN 250 GRAMS, MINI LAPARTOMY FOR SPECIMEN DELIVERY; PELVIC AND PERI-AORTIC LYMPHADENECTOMY;  Surgeon: Everitt Amber, MD;  Location: [facility];  Service: Gynecology;  Laterality: N/A;  . SENTINEL NODE BIOPSY N/A 10/22/2019   Procedure: SENTINEL NODE BIOPSY;  Surgeon: Everitt Amber, MD;  Location: [facility];  Service: Gynecology;  Laterality: N/A;     OB History    Gravida  1   Para  0   Term  0   Preterm  0   AB  1  Living  0     SAB  0   TAB  1   Ectopic  0   Multiple  0   Live Births  0           Family History  Problem Relation Age of Onset  . Diabetes Mother   . Hypertension Mother   . Colon polyps Mother 24  . Dementia Mother 76  . Diabetes Father   . Congestive Heart Failure Father   . Pancreatic cancer Paternal Aunt   . Colon cancer Neg Hx   . Breast cancer Neg Hx   . Lung cancer Neg Hx   . Esophageal cancer Neg Hx   . Rectal cancer Neg Hx   .  Stomach cancer Neg Hx     Social History   Tobacco Use  . Smoking status: Never Smoker  . Smokeless tobacco: Never Used  . Tobacco comment: few puffs but not a true smoker quit many yrs ago  Vaping Use  . Vaping Use: Never used  Substance Use Topics  . Alcohol use: Never  . Drug use: Never    Home Medications Prior to Admission medications   Medication Sig Start Date End Date Taking? Authorizing Provider  acetaminophen (TYLENOL) 500 MG tablet Take 1,000 mg by mouth every 6 (six) hours as needed for moderate pain.    [provider]  APPLE CIDER VINEGAR PO Take 5 mLs by mouth daily.     [provider]  b complex vitamins tablet Take 1 tablet by mouth daily.    [provider]  Blood Pressure Monitoring (BLOOD PRESSURE KIT) DEVI 1 Units by Does not apply route every morning. 07/01/20   Raylene Everts, MD  calcium-vitamin D (OSCAL WITH D) 500-200 MG-UNIT tablet Take 1 tablet by mouth.    [provider]  dexamethasone (DECADRON) 4 MG tablet Take 2 tabs at the night before and 2 tab the morning of chemotherapy, every 3 weeks, by mouth Patient taking differently: Take 4 mg by mouth See admin instructions. Take 2 tabs at the night before chemotherapy. 05/16/20   [name], MD  Multiple Vitamin (MULTIVITAMIN) capsule Take 1 capsule by mouth daily.    [provider]  ondansetron (ZOFRAN) 8 MG tablet Take 1 tablet (8 mg total) by mouth every 8 (eight) hours as needed. Start on the third day after chemotherapy. 05/16/20   [name], MD  polyethylene glycol (MIRALAX / GLYCOLAX) 17 g packet Take 17 g by mouth daily as needed for mild constipation.    [provider]  prochlorperazine (COMPAZINE) 10 MG tablet Take 1 tablet (10 mg total) by mouth every 6 (six) hours as needed (Nausea or vomiting). 05/16/20   [name], MD  atorvastatin (LIPITOR) 20 MG tablet Take 1 tablet (20 mg total) by mouth daily. 05/03/20 07/01/20  Erline Hau, MD    Allergies    Benicar hct [olmesartan medoxomil-hctz], Bactrim [sulfamethoxazole-trimethoprim], Pravastatin, Shellfish allergy, Amlodipine, and Iodinated diagnostic agents  Review of Systems   Review of Systems  All other systems reviewed and are negative.   Physical Exam Updated Vital Signs BP 123/89 (BP Location: Right Arm)   Pulse (!) 137   Temp 98.2 F (36.8 C) (Oral)   Resp 18   SpO2 100%   Physical Exam Vitals and nursing note reviewed.  Constitutional:      General: She is not in acute distress.    Appearance: Normal appearance. She is well-developed. She  is not toxic-appearing.  HENT:     Head: Normocephalic and atraumatic.  Eyes:     General: Lids are normal.     Conjunctiva/sclera: Conjunctivae normal.     Pupils: Pupils are equal, round, and reactive to light.  Neck:     Thyroid: No thyroid mass.     Trachea: No tracheal deviation.  Cardiovascular:     Rate and Rhythm: Regular rhythm. Tachycardia present.     Heart sounds: Normal heart sounds. No murmur heard.  No gallop.   Pulmonary:     Effort: Pulmonary effort is normal. No respiratory distress.     Breath sounds: Normal breath sounds. No stridor. No decreased breath sounds, wheezing, rhonchi or rales.  Abdominal:     General: Bowel sounds are normal. There is no distension.     Palpations: Abdomen is soft.     Tenderness: There is no abdominal tenderness. There is no rebound.  Musculoskeletal:        General: No tenderness. Normal range of motion.     Cervical back: Normal range of motion and neck supple.  Skin:    General: Skin is warm and dry.     Findings: No abrasion or rash.  Neurological:     Mental Status: She is alert and oriented to person, place, and time.     GCS: GCS eye subscore is 4. GCS verbal subscore is 5. GCS motor subscore is 6.     Cranial Nerves: No cranial nerve deficit.     Sensory: No sensory deficit.  Psychiatric:        Speech: Speech normal.        Behavior:  Behavior normal.     ED Results / Procedures / Treatments   Labs (all labs ordered are listed, but only abnormal results are displayed) Labs Reviewed  BASIC METABOLIC PANEL - Abnormal; Notable for the following components:      Result Value   Glucose, Bld 121 (*)    All other components within normal limits  CBC - Abnormal; Notable for the following components:   WBC 2.7 (*)    MCH 25.4 (*)    RDW 18.8 (*)    All other components within normal limits  CBG MONITORING, ED - Abnormal; Notable for the following components:   Glucose-Capillary 117 (*)    All other components within normal limits  URINALYSIS, ROUTINE W REFLEX MICROSCOPIC    EKG EKG Interpretation  Date/Time:  Thursday August 25 2020 16:24:17 EDT Ventricular Rate:  110 PR Interval:    QRS Duration: 84 QT Interval:  313 QTC Calculation: 424 R Axis:   55 Text Interpretation: Sinus tachycardia Probable left atrial enlargement Low voltage, precordial leads 12 Lead; [place] Confirmed by Lacretia Leigh ([id number]) on 08/25/2020 5:49:05 PM   Radiology No results found.  Procedures Procedures (including critical care time)  Medications Ordered in ED Medications  hydrocortisone sodium succinate (SOLU-CORTEF) injection 200 mg (has no administration in time range)  diphenhydrAMINE (BENADRYL) capsule 50 mg (has no administration in time range)    Or  diphenhydrAMINE (BENADRYL) injection 50 mg (has no administration in time range)    ED Course  I have reviewed the triage vital signs and the nursing notes.  Pertinent labs & imaging results that were available during my care of the patient were reviewed by me and considered in my medical decision making (see chart for details).    MDM Rules/Calculators/A&P  CT chest negative for PE.  Heart rate improved with IV fluids.  Patient feels better and will discharge home Final Clinical Impression(s) / ED Diagnoses Final diagnoses:  None     Rx / DC Orders ED Discharge Orders    None       Lacretia Leigh, MD 08/25/20 2340

## 2020-08-26 MED ORDER — HEPARIN SOD (PORK) LOCK FLUSH 100 UNIT/ML IV SOLN
500.0000 [IU] | Freq: Once | INTRAVENOUS | Status: AC
  Administered 2020-08-26: 500 [IU]
  Filled 2020-08-26: qty 5

## 2020-09-01 ENCOUNTER — Inpatient Hospital Stay: Payer: Medicare Other | Attending: Gynecologic Oncology

## 2020-09-01 ENCOUNTER — Other Ambulatory Visit: Payer: Self-pay

## 2020-09-01 DIAGNOSIS — C541 Malignant neoplasm of endometrium: Secondary | ICD-10-CM | POA: Insufficient documentation

## 2020-09-01 DIAGNOSIS — C787 Secondary malignant neoplasm of liver and intrahepatic bile duct: Secondary | ICD-10-CM | POA: Insufficient documentation

## 2020-09-01 DIAGNOSIS — Z23 Encounter for immunization: Secondary | ICD-10-CM

## 2020-09-01 DIAGNOSIS — D61818 Other pancytopenia: Secondary | ICD-10-CM | POA: Insufficient documentation

## 2020-09-09 MED FILL — Fosaprepitant Dimeglumine For IV Infusion 150 MG (Base Eq): INTRAVENOUS | Qty: 5 | Status: AC

## 2020-09-09 MED FILL — Dexamethasone Sodium Phosphate Inj 100 MG/10ML: INTRAMUSCULAR | Qty: 1 | Status: AC

## 2020-09-12 ENCOUNTER — Encounter: Payer: Self-pay | Admitting: Hematology and Oncology

## 2020-09-12 ENCOUNTER — Other Ambulatory Visit: Payer: Self-pay

## 2020-09-12 ENCOUNTER — Inpatient Hospital Stay: Payer: Medicare Other

## 2020-09-12 ENCOUNTER — Telehealth: Payer: Self-pay

## 2020-09-12 ENCOUNTER — Inpatient Hospital Stay (HOSPITAL_BASED_OUTPATIENT_CLINIC_OR_DEPARTMENT_OTHER): Payer: Medicare Other | Admitting: Hematology and Oncology

## 2020-09-12 VITALS — BP 139/69 | HR 87 | Temp 97.8°F | Resp 17 | Ht 63.0 in | Wt 213.0 lb

## 2020-09-12 DIAGNOSIS — R21 Rash and other nonspecific skin eruption: Secondary | ICD-10-CM

## 2020-09-12 DIAGNOSIS — C787 Secondary malignant neoplasm of liver and intrahepatic bile duct: Secondary | ICD-10-CM | POA: Diagnosis not present

## 2020-09-12 DIAGNOSIS — D61818 Other pancytopenia: Secondary | ICD-10-CM

## 2020-09-12 DIAGNOSIS — C541 Malignant neoplasm of endometrium: Secondary | ICD-10-CM

## 2020-09-12 DIAGNOSIS — Z7189 Other specified counseling: Secondary | ICD-10-CM

## 2020-09-12 LAB — CMP (CANCER CENTER ONLY)
ALT: 11 U/L (ref 0–44)
AST: 25 U/L (ref 15–41)
Albumin: 3.4 g/dL — ABNORMAL LOW (ref 3.5–5.0)
Alkaline Phosphatase: 82 U/L (ref 38–126)
Anion gap: 9 (ref 5–15)
BUN: 13 mg/dL (ref 8–23)
CO2: 25 mmol/L (ref 22–32)
Calcium: 9.1 mg/dL (ref 8.9–10.3)
Chloride: 105 mmol/L (ref 98–111)
Creatinine: 0.99 mg/dL (ref 0.44–1.00)
GFR, Est AFR Am: >60 mL/min (ref >60)
GFR, Estimated: 59 mL/min — ABNORMAL LOW (ref >60)
Glucose, Bld: 112 mg/dL — ABNORMAL HIGH (ref 70–99)
Potassium: 4 mmol/L (ref 3.5–5.1)
Sodium: 139 mmol/L (ref 135–145)
Total Bilirubin: 0.4 mg/dL (ref 0.3–1.2)
Total Protein: 7.1 g/dL (ref 6.5–8.1)

## 2020-09-12 LAB — CBC WITH DIFFERENTIAL (CANCER CENTER ONLY)
Abs Immature Granulocytes: 0.05 10*3/uL (ref 0.00–0.07)
Basophils Absolute: 0 10*3/uL (ref 0.0–0.1)
Basophils Relative: 0 %
Eosinophils Absolute: 0 10*3/uL (ref 0.0–0.5)
Eosinophils Relative: 1 %
HCT: 33.5 % — ABNORMAL LOW (ref 36.0–46.0)
Hemoglobin: 10.7 g/dL — ABNORMAL LOW (ref 12.0–15.0)
Immature Granulocytes: 1 %
Lymphocytes Relative: 33 %
Lymphs Abs: 1.2 10*3/uL (ref 0.7–4.0)
MCH: 25.4 pg — ABNORMAL LOW (ref 26.0–34.0)
MCHC: 31.9 g/dL (ref 30.0–36.0)
MCV: 79.4 fL — ABNORMAL LOW (ref 80.0–100.0)
Monocytes Absolute: 0.4 10*3/uL (ref 0.1–1.0)
Monocytes Relative: 11 %
Neutro Abs: 1.9 10*3/uL (ref 1.7–7.7)
Neutrophils Relative %: 54 %
Platelet Count: 196 10*3/uL (ref 150–400)
RBC: 4.22 MIL/uL (ref 3.87–5.11)
RDW: 18.4 % — ABNORMAL HIGH (ref 11.5–15.5)
WBC Count: 3.6 10*3/uL — ABNORMAL LOW (ref 4.0–10.5)
nRBC: 0 % (ref 0.0–0.2)

## 2020-09-12 MED ORDER — SODIUM CHLORIDE 0.9% FLUSH
10.0000 mL | Freq: Once | INTRAVENOUS | Status: AC
  Administered 2020-09-12: 10 mL
  Filled 2020-09-12: qty 10

## 2020-09-12 NOTE — Assessment & Plan Note (Signed)
Her blood counts are improved Her anemia is due to thalassemia trait We will observe for now

## 2020-09-12 NOTE — Assessment & Plan Note (Signed)
Her skin rash has resolved I recommend steroids before her next imaging

## 2020-09-12 NOTE — Progress Notes (Signed)
[facility] OFFICE PROGRESS NOTE  Patient Care Team: Isaac Bliss, Rayford Halsted, MD as PCP - General (Internal Medicine) Axel Filler, MD  ASSESSMENT & PLAN:  Endometrial cancer [facility]) We have extensive discussions today about the role of chemotherapy holiday Given her last imaging study is near normal, I think is reasonable to skip cycle #6 I plan to repeat imaging study in mid November to reassess If she continues to have complete remission status, we can continue on observation She will need to be referred back to GYN oncologist in the near future for pelvic exam We discussed briefly about the role of immunotherapy with Lenvima for the future  Pancytopenia, acquired ([place]) Her blood counts are improved Her anemia is due to thalassemia trait We will observe for now  Metastasis to liver [facility]) Her liver enzymes are stable Observe for now  Skin rash Her skin rash has resolved I recommend steroids before her next imaging   Orders Placed This Encounter  Procedures   CT ABDOMEN PELVIS W CONTRAST    Standing Status:   Future    Standing Expiration Date:   09/12/2021    Order Specific Question:   If indicated for the ordered procedure, I authorize the administration of contrast media per Radiology protocol    Answer:   Yes    Order Specific Question:   Preferred imaging location?    Answer:   [facility]    Order Specific Question:   Radiology Contrast Protocol - do NOT remove file path    Answer:   [email or web address]    All questions were answered. The patient knows to call the clinic with any problems, questions or concerns. The total time spent in the appointment was 25 minutes encounter with patients including review of chart and various tests results, discussions about plan of care and coordination of care plan   [name], MD 09/12/2020 10:30 AM  INTERVAL HISTORY: Please see below for problem  oriented charting. She returns for treatment today She has made decision not to do cycle 6 of treatment because of perceived side effects from recent treatment She denies recent skin rash Her appetite is good No recent abdominal bloating, nausea or changes in bowel habits  SUMMARY OF ONCOLOGIC HISTORY: Oncology History Overview Note  Poorly differentiated carcinoma, mixed histology with squamous differentiation, rare focus of clear cells as well as serous features MSI stable Her2 negative   Endometrial cancer (Tina Stone)  09/01/2019 Initial Diagnosis   The patient reported a history of postmenopausal bleeding that began 1 to 2 months before diagnosis   09/14/2019 Imaging   US pelvis 1. Enlarged uterus with numerous myometrial masses, presumably fibroids. 2. Endometrial thickness of 6.2 mm. In the setting of post-menopausal bleeding, endometrial sampling is indicated to exclude carcinoma. If results are benign, sonohysterogram should be considered for focal lesion work-up.  3. Nonvisualized ovaries   09/23/2019 Pathology Results   A. ENDOMETRIUM, BIOPSY:  - Poorly differentiated carcinoma   10/15/2019 Imaging   Ct scan of chest, abdomen and pelvis: No evidence of metastatic disease within the chest, abdomen, or pelvis.   Enlarged fibroid uterus.   Colonic diverticulosis. No radiographic evidence of diverticulitis.   Aortic and coronary artery atherosclerosis.     10/22/2019 Pathology Results   SURGICAL PATHOLOGY   FINAL MICROSCOPIC DIAGNOSIS:   A. UTERUS, BILATERAL TUBES AND OVARIES, HYSTERECTOMY:  Poorly differentiated carcinoma, 6.5 cm.  Lymphovascular involvement by tumor.  Carcinoma involves inner half of the myometrium.  Margins not involved.  Cervix, bilateral fallopian tubes and bilateral ovaries free of tumor.   B. LYMPH NODE, RIGHT EXTERNAL SENTINEL, BIOPSY:  One lymph node with no metastatic carcinoma (0/1).   C. LYMPH NODE, RIGHT PELVIC, BIOPSY  Five lymph nodes  with no metastatic carcinoma (0/5).   D. LYMPH NODE, LEFT PELVIC, BIOPSY:  Five lymph nodes with no metastatic carcinoma (0/5).   E. LYMPH NODE, RIGHT PERI AORTIC, BIOPSY:  One lymph node with no metastatic carcinoma (0/1).   F. LYMPH NODE, LEFT PERI AORTIC, BIOPSY:  Five lymph nodes with no metastatic carcinoma (0/5).    ONCOLOGY TABLE:  UTERUS, CARCINOMA OR CARCINOSARCOMA   Procedure: Total hysterectomy with bilateral f-oophorectomy and sentinel  lymph nodes.  Histologic type: Poorly differentiated carcinoma, see comment.  Histologic Grade: High-grade, FIGO 3.  Myometrial invasion:       Depth of invasion: 13 mm       Myometrial thickness: 40 mm  Uterine Serosa Involvement: Not identified  Cervical stromal involvement: Not identified  Extent of involvement of other organs: Not identified  Lymphovascular invasion: Present  Regional Lymph Nodes:       Examined:     17 Sentinel                               0 non-sentinel                               17 total        Lymph nodes with metastasis: 0        Isolated tumor cells (<0.2 mm): 0        Micrometastasis:  (>0.2 mm and < 2.0 mm): 0        Macrometastasis: (>2.0 mm): 0  Representative Tumor Block: A5, A6, A7 and A8.  MMR / MSI testing: Pending  Pathologic Stage Classification (pTNM, AJCC 8th edition):  pT1a, pN0  Comments: The carcinoma is a high-grade poorly differentiated carcinoma which morphologically has predominantly serous features.  There are a few foci with squamous differentiation and a rare focus of clear cell features.  Immunohistochemistry for cytokeratin AE1/AE3 is performed on the sentinel lymph nodes and no positivity is identified.    10/22/2019 Surgery   Pre-operative Diagnosis: endometrial cancer grade 3   Post-operative Diagnosis: same,    Operation: Robotic-assisted laparoscopic total hysterectomy for uterus >250gm with bilateral salpingoophorectomy, SLN mapping, bilateral pelvic and para-aortic  lymphadenectomy.   Surgeon: Donaciano Eva    Operative Findings:  : 16cm bulky fibroid uterus, normal ovaries bilaterally, no suspicious lymph nodes.     11/10/2019 Cancer Staging   Staging form: Corpus Uteri - Carcinoma and Carcinosarcoma, AJCC 8th Edition - Pathologic: Stage IVB (pT1a, pN0, cM1) - Signed by [name], MD on 05/16/2020   05/10/2020 Imaging   1. New hypodense 2.0 cm segment 4A left liver lobe mass, suspicious for hepatic metastasis. 2. New left pelvic sidewall 1.4 cm soft tissue nodule, suspicious for left internal iliac nodal metastasis. 3. New left vaginal cuff 1.6 x 1.3 cm soft tissue nodule, suspicious for recurrent tumor. 4. Aortic Atherosclerosis (ICD10-I70.0).   05/18/2020 Procedure   Successful placement of a right internal jugular approach power injectable Port-A-Cath. The catheter is ready for immediate use.   05/23/2020 -  Chemotherapy   The patient had carboplatin and taxol for chemotherapy treatment.  07/28/2020 Imaging   1. No findings identified to suggest recurrent or metastatic disease. 2. Indeterminate and slightly exophytic lesion arising from the inferior pole of left kidney measures 0.8 cm. Technically this is too small to reliably characterize. Attention on follow-up imaging is advised. 3. Aortic atherosclerosis and LAD coronary artery atherosclerotic calcifications.     REVIEW OF SYSTEMS:   Constitutional: Denies fevers, chills or abnormal weight loss Eyes: Denies blurriness of vision Ears, nose, mouth, throat, and face: Denies mucositis or sore throat Respiratory: Denies cough, dyspnea or wheezes Cardiovascular: Denies palpitation, chest discomfort or lower extremity swelling Gastrointestinal:  Denies nausea, heartburn or change in bowel habits Skin: Denies abnormal skin rashes Lymphatics: Denies new lymphadenopathy or easy bruising Neurological:Denies numbness, tingling or new weaknesses Behavioral/Psych: Mood is stable, no new  changes  All other systems were reviewed with the patient and are negative.  I have reviewed the past medical history, past surgical history, social history and family history with the patient and they are unchanged from previous note.  ALLERGIES:  is allergic to benicar hct [olmesartan medoxomil-hctz], bactrim [sulfamethoxazole-trimethoprim], pravastatin, shellfish allergy, amlodipine, and iodinated diagnostic agents.  MEDICATIONS:  Current Outpatient Medications  Medication Sig Dispense Refill   acetaminophen (TYLENOL) 500 MG tablet Take 1,000 mg by mouth every 6 (six) hours as needed for moderate pain.     APPLE CIDER VINEGAR PO Take 5 mLs by mouth daily.      b complex vitamins tablet Take 1 tablet by mouth daily.     Blood Pressure Monitoring (BLOOD PRESSURE KIT) DEVI 1 Units by Does not apply route every morning. 1 each 0   calcium-vitamin D (OSCAL WITH D) 500-200 MG-UNIT tablet Take 1 tablet by mouth daily with breakfast.      dexamethasone (DECADRON) 4 MG tablet Take 2 tabs at the night before and 2 tab the morning of chemotherapy, every 3 weeks, by mouth (Patient taking differently: Take 4 mg by mouth See admin instructions. Take 2 tabs at the night before chemotherapy.) 20 tablet 6   diphenhydrAMINE (BENADRYL) 25 MG tablet Take 25 mg by mouth every 6 (six) hours as needed for allergies.     famotidine (PEPCID) 20 MG tablet Take 20 mg by mouth 2 (two) times daily as needed for heartburn or indigestion.     loratadine (CLARITIN) 10 MG tablet Take 10 mg by mouth daily as needed for allergies.     Multiple Vitamin (MULTIVITAMIN) capsule Take 1 capsule by mouth daily.     ondansetron (ZOFRAN) 8 MG tablet Take 1 tablet (8 mg total) by mouth every 8 (eight) hours as needed. Start on the third day after chemotherapy. 30 tablet 1   polyethylene glycol (MIRALAX / GLYCOLAX) 17 g packet Take 17 g by mouth daily as needed for mild constipation.     prochlorperazine (COMPAZINE) 10 MG  tablet Take 1 tablet (10 mg total) by mouth every 6 (six) hours as needed (Nausea or vomiting). 30 tablet 1   No current facility-administered medications for this visit.    PHYSICAL EXAMINATION: ECOG PERFORMANCE STATUS: 0 - Asymptomatic  Vitals:   09/12/20 0913  BP: 139/69  Pulse: 87  Resp: 17  Temp: 97.8 F (36.6 C)  SpO2: 100%   Filed Weights   09/12/20 0913  Weight: 213 lb (96.6 kg)    GENERAL:alert, no distress and comfortable NEURO: alert & oriented x 3 with fluent speech, no focal motor/sensory deficits  LABORATORY DATA:  I have reviewed the data as listed  Component Value Date/Time   NA 139 09/12/2020 0853   NA 140 07/04/2017 0952   K 4.0 09/12/2020 0853   CL 105 09/12/2020 0853   CO2 25 09/12/2020 0853   GLUCOSE 112 (H) 09/12/2020 0853   BUN 13 09/12/2020 0853   BUN 10 07/04/2017 0952   CREATININE 0.99 09/12/2020 0853   CREATININE 0.81 03/23/2015 0946   CALCIUM 9.1 09/12/2020 0853   PROT 7.1 09/12/2020 0853   ALBUMIN 3.4 (L) 09/12/2020 0853   AST 25 09/12/2020 0853   ALT 11 09/12/2020 0853   ALKPHOS 82 09/12/2020 0853   BILITOT 0.4 09/12/2020 0853   GFRNONAA 59 (L) 09/12/2020 0853   GFRNONAA 78 03/23/2015 0946   GFRAA >60 09/12/2020 0853   GFRAA >89 03/23/2015 0946    No results found for: SPEP, UPEP  Lab Results  Component Value Date   WBC 3.6 (L) 09/12/2020   NEUTROABS 1.9 09/12/2020   HGB 10.7 (L) 09/12/2020   HCT 33.5 (L) 09/12/2020   MCV 79.4 (L) 09/12/2020   PLT 196 09/12/2020      Chemistry      Component Value Date/Time   NA 139 09/12/2020 0853   NA 140 07/04/2017 0952   K 4.0 09/12/2020 0853   CL 105 09/12/2020 0853   CO2 25 09/12/2020 0853   BUN 13 09/12/2020 0853   BUN 10 07/04/2017 0952   CREATININE 0.99 09/12/2020 0853   CREATININE 0.81 03/23/2015 0946      Component Value Date/Time   CALCIUM 9.1 09/12/2020 0853   ALKPHOS 82 09/12/2020 0853   AST 25 09/12/2020 0853   ALT 11 09/12/2020 0853   BILITOT 0.4  09/12/2020 1290

## 2020-09-12 NOTE — Telephone Encounter (Signed)
She called and left a message. She is requesting a note for work. She ask that the note state the reason and the date of her treatment.

## 2020-09-12 NOTE — Assessment & Plan Note (Signed)
We have extensive discussions today about the role of chemotherapy holiday Given her last imaging study is near normal, I think is reasonable to skip cycle #6 I plan to repeat imaging study in mid November to reassess If she continues to have complete remission status, we can continue on observation She will need to be referred back to GYN oncologist in the near future for pelvic exam We discussed briefly about the role of immunotherapy with Lenvima for the future

## 2020-09-12 NOTE — Assessment & Plan Note (Signed)
Her liver enzymes are stable Observe for now

## 2020-09-13 ENCOUNTER — Encounter: Payer: Self-pay | Admitting: Hematology and Oncology

## 2020-09-13 NOTE — Telephone Encounter (Signed)
I created a copy sent to her and I will print and sign another one

## 2020-09-13 NOTE — Telephone Encounter (Signed)
Called back. The letter for work needs to be a letter that she has been under Dr. Alvy Bimler care from 05/16/20 thru 09/12/20 for treatment and the reason.

## 2020-09-13 NOTE — Telephone Encounter (Signed)
Is she asking for all previous treatment dates?

## 2020-09-13 NOTE — Telephone Encounter (Signed)
Called and given below message. She verbalized understanding. She will come and pick up letter.

## 2020-09-27 ENCOUNTER — Ambulatory Visit (INDEPENDENT_AMBULATORY_CARE_PROVIDER_SITE_OTHER): Payer: Medicare Other | Admitting: Internal Medicine

## 2020-09-27 ENCOUNTER — Encounter: Payer: Self-pay | Admitting: Internal Medicine

## 2020-09-27 ENCOUNTER — Other Ambulatory Visit: Payer: Self-pay

## 2020-09-27 ENCOUNTER — Inpatient Hospital Stay: Payer: Medicare Other

## 2020-09-27 VITALS — BP 140/90 | HR 78 | Temp 98.6°F | Wt 213.6 lb

## 2020-09-27 DIAGNOSIS — I10 Essential (primary) hypertension: Secondary | ICD-10-CM | POA: Diagnosis not present

## 2020-09-27 DIAGNOSIS — R7303 Prediabetes: Secondary | ICD-10-CM | POA: Diagnosis not present

## 2020-09-27 LAB — POCT GLUCOSE (DEVICE FOR HOME USE): Glucose Fasting, POC: 98 mg/dL (ref 70–99)

## 2020-09-27 NOTE — Patient Instructions (Signed)
-  Nice seeing you today!!  -See you back in 6 months or sooner as needed.

## 2020-09-27 NOTE — Progress Notes (Signed)
Nutrition Assessment:  Patient has completed treatment for endometrial cancer.  Interested in survivorship nutrition. Past medical history of GERD, HLD, HTN.  Met with patient in clinic.  Patient reports that she is interested in learning about healthy eating after cancer.    Medications: b complex, MVI, pepcid  Labs: reviewed  Anthropometrics:   Height: 63 inches Weight: 213 lb 9.6 oz  BMI: 37   NUTRITION DIAGNOSIS: Food and nutrition related knowledge deficit related to cancer as evidenced by questions regarding healthy eating after treatment   INTERVENTION:  Discussed [place] of Cancer recommendations with patient regarding nutrition.  Handout provided Provided reliable resources for patient to learn more about healthy eating and getting to healthy weight.   Questions answered.   Contact information provided   NEXT VISIT: no follow-up Patient to contact if needed  Analaya Hoey B. Zenia Resides, [place], [place] Registered Dietitian [phone] (mobile)

## 2020-09-27 NOTE — Progress Notes (Signed)
Established Patient Office Visit     This visit occurred during the SARS-CoV-2 public health emergency.  Safety protocols were in place, including screening questions prior to the visit, additional usage of staff PPE, and extensive cleaning of exam room while observing appropriate contact time as indicated for disinfecting solutions.    CC/Reason for Visit: Blood pressure follow-up  HPI: Tina Stone is a 70 y.o. female who is coming in today for the above mentioned reasons. Past Medical History is significant for: Hypertension that we have been observing off medications per her preference, GERD not on PPI therapy, hyperlipidemia not on statins due to history of intolerance with myalgias, history of endometrial cancer.  She is here today for blood pressure follow-up.  She brings me in her blood pressure log with numbers as follow:  127/70 135/85 126/72 129/79 136/86 139/69 123/87  She has no acute complaints.  She tells me that they decided to skip her last chemo cycle because her disease appears to be in remission.  She is going in for scans in November.   Past Medical/Surgical History: Past Medical History:  Diagnosis Date  . Adenomatous colon polyp   . Allergic rhinitis, seasonal   . Allergy   . Beta thalassemia trait 11/25/2013  . Cholelithiasis   . Class 3 obesity without serious comorbidity with body mass index (BMI) of 40.0 to 44.9 in adult 11/19/2012  . endometrial ca dx'd 08/2009   endometrial   . GERD (gastroesophageal reflux disease)   . HLD (hyperlipidemia)   . Hypercholesterolemia   . Hypertension 03/18/2017   no meds   . Intraductal papilloma of left breast    Patient underwent left needle-localized lumpectomy by Dr. Imogene Burn. Tsuei on 09/09/2013; pathology showed intraductal papilloma with no atypia or malignancy identified.  Marland Kitchen PONV (postoperative nausea and vomiting)   . Pre-diabetes    pt denies  . Uterine fibroid     Past Surgical History:    Procedure Laterality Date  . [place]   right  . BREAST EXCISIONAL BIOPSY Left 2014   benign  . BREAST LUMPECTOMY WITH NEEDLE LOCALIZATION Left 09/09/2013   Procedure: BREAST LUMPECTOMY WITH NEEDLE LOCALIZATION;  Surgeon: Imogene Burn. Georgette Dover, MD;  Location: [facility];  Service: General;  Laterality: Left;  . CHOLECYSTECTOMY    . COLONOSCOPY    . IR IMAGING GUIDED PORT INSERTION  05/18/2020  . POLYPECTOMY    . ROBOTIC ASSISTED LAPAROSCOPIC CHOLECYSTECTOMY  09/09/2019  . ROBOTIC ASSISTED TOTAL HYSTERECTOMY WITH BILATERAL SALPINGO OOPHERECTOMY N/A 10/22/2019   Procedure: XI ROBOTIC ASSISTED TOTAL HYSTERECTOMY WITH BILATERAL SALPINGO OOPHORECTOMY GREATER THAN 250 GRAMS, MINI LAPARTOMY FOR SPECIMEN DELIVERY; PELVIC AND PERI-AORTIC LYMPHADENECTOMY;  Surgeon: Everitt Amber, MD;  Location: [facility];  Service: Gynecology;  Laterality: N/A;  . SENTINEL NODE BIOPSY N/A 10/22/2019   Procedure: SENTINEL NODE BIOPSY;  Surgeon: Everitt Amber, MD;  Location: [facility];  Service: Gynecology;  Laterality: N/A;    Social History:  reports that she has never smoked. She has never used smokeless tobacco. She reports that she does not drink alcohol and does not use drugs.  Allergies: Allergies  Allergen Reactions  . Benicar Hct [Olmesartan Medoxomil-Hctz] Shortness Of Breath and Palpitations  . Bactrim [Sulfamethoxazole-Trimethoprim] Other (See Comments)    Abdominal pain, dizziness  . Pravastatin Other (See Comments)    Lower abdominal pain  . Shellfish Allergy Nausea And Vomiting  . Amlodipine Palpitations  . Iodinated Diagnostic Agents Itching  Developed itching and hives after injection on 05/10/20; needs 13hr prep in future    Family History:  Family History  Problem Relation Age of Onset  . Diabetes Mother   . Hypertension Mother   . Colon polyps Mother 50  . Dementia Mother 77  . Diabetes Father   . Congestive Heart Failure Father   . Pancreatic cancer Paternal Aunt   . Colon  cancer Neg Hx   . Breast cancer Neg Hx   . Lung cancer Neg Hx   . Esophageal cancer Neg Hx   . Rectal cancer Neg Hx   . Stomach cancer Neg Hx      Current Outpatient Medications:  .  acetaminophen (TYLENOL) 500 MG tablet, Take 1,000 mg by mouth every 6 (six) hours as needed for moderate pain., Disp: , Rfl:  .  APPLE CIDER VINEGAR PO, Take 5 mLs by mouth daily. , Disp: , Rfl:  .  b complex vitamins tablet, Take 1 tablet by mouth daily., Disp: , Rfl:  .  Blood Pressure Monitoring (BLOOD PRESSURE KIT) DEVI, 1 Units by Does not apply route every morning., Disp: 1 each, Rfl: 0 .  calcium-vitamin D (OSCAL WITH D) 500-200 MG-UNIT tablet, Take 1 tablet by mouth daily with breakfast. , Disp: , Rfl:  .  dexamethasone (DECADRON) 4 MG tablet, Take 2 tabs at the night before and 2 tab the morning of chemotherapy, every 3 weeks, by mouth (Patient taking differently: Take 4 mg by mouth See admin instructions. Take 2 tabs at the night before chemotherapy.), Disp: 20 tablet, Rfl: 6 .  diphenhydrAMINE (BENADRYL) 25 MG tablet, Take 25 mg by mouth every 6 (six) hours as needed for allergies., Disp: , Rfl:  .  famotidine (PEPCID) 20 MG tablet, Take 20 mg by mouth 2 (two) times daily as needed for heartburn or indigestion., Disp: , Rfl:  .  loratadine (CLARITIN) 10 MG tablet, Take 10 mg by mouth daily as needed for allergies., Disp: , Rfl:  .  Multiple Vitamin (MULTIVITAMIN) capsule, Take 1 capsule by mouth daily., Disp: , Rfl:  .  ondansetron (ZOFRAN) 8 MG tablet, Take 1 tablet (8 mg total) by mouth every 8 (eight) hours as needed. Start on the third day after chemotherapy., Disp: 30 tablet, Rfl: 1 .  polyethylene glycol (MIRALAX / GLYCOLAX) 17 g packet, Take 17 g by mouth daily as needed for mild constipation., Disp: , Rfl:  .  prochlorperazine (COMPAZINE) 10 MG tablet, Take 1 tablet (10 mg total) by mouth every 6 (six) hours as needed (Nausea or vomiting)., Disp: 30 tablet, Rfl: 1  Review of Systems:   Constitutional: Denies fever, chills, diaphoresis, appetite change and fatigue.  HEENT: Denies photophobia, eye pain, redness, hearing loss, ear pain, congestion, sore throat, rhinorrhea, sneezing, mouth sores, trouble swallowing, neck pain, neck stiffness and tinnitus.   Respiratory: Denies SOB, DOE, cough, chest tightness,  and wheezing.   Cardiovascular: Denies chest pain, palpitations and leg swelling.  Gastrointestinal: Denies nausea, vomiting, abdominal pain, diarrhea, constipation, blood in stool and abdominal distention.  Genitourinary: Denies dysuria, urgency, frequency, hematuria, flank pain and difficulty urinating.  Endocrine: Denies: hot or cold intolerance, sweats, changes in hair or nails, polyuria, polydipsia. Musculoskeletal: Denies myalgias, back pain, joint swelling, arthralgias and gait problem.  Skin: Denies pallor, rash and wound.  Neurological: Denies dizziness, seizures, syncope, weakness, light-headedness, numbness and headaches.  Hematological: Denies adenopathy. Easy bruising, personal or family bleeding history  Psychiatric/Behavioral: Denies suicidal ideation, mood changes, confusion, nervousness,  sleep disturbance and agitation    Physical Exam: Vitals:   09/27/20 0726  BP: 140/90  Pulse: 78  Temp: 98.6 F (37 C)  TempSrc: Oral  SpO2: 98%  Weight: 213 lb 9.6 oz (96.9 kg)    Body mass index is 37.84 kg/m.   Constitutional: NAD, calm, comfortable Eyes: PERRL, lids and conjunctivae normal ENMT: Mucous membranes are moist.  Respiratory: clear to auscultation bilaterally, no wheezing, no crackles. Normal respiratory effort. No accessory muscle use.  Cardiovascular: Regular rate and rhythm, no murmurs / rubs / gallops. No extremity edema.   Neurologic: Grossly intact and nonfocal. Psychiatric: Normal judgment and insight. Alert and oriented x 3. Normal mood.    Impression and Plan:  Prediabetes  -CBG is 89 today, done per her request.  Essential  hypertension -Okay to continue to observe off medications given ambulatory measurements.    Patient Instructions  -Nice seeing you today!!  -See you back in 6 months or sooner as needed.      Lelon Frohlich, MD [facility]

## 2020-10-12 ENCOUNTER — Other Ambulatory Visit: Payer: Self-pay

## 2020-10-12 ENCOUNTER — Other Ambulatory Visit: Payer: Medicare Other

## 2020-10-12 DIAGNOSIS — Z20822 Contact with and (suspected) exposure to covid-19: Secondary | ICD-10-CM | POA: Diagnosis not present

## 2020-10-13 LAB — SARS-COV-2, NAA 2 DAY TAT

## 2020-10-13 LAB — NOVEL CORONAVIRUS, NAA: SARS-CoV-2, NAA: NOT DETECTED

## 2020-10-26 ENCOUNTER — Other Ambulatory Visit: Payer: Self-pay | Admitting: Internal Medicine

## 2020-10-26 ENCOUNTER — Ambulatory Visit (INDEPENDENT_AMBULATORY_CARE_PROVIDER_SITE_OTHER): Payer: Medicare Other | Admitting: Internal Medicine

## 2020-10-26 ENCOUNTER — Encounter: Payer: Self-pay | Admitting: Internal Medicine

## 2020-10-26 ENCOUNTER — Ambulatory Visit (INDEPENDENT_AMBULATORY_CARE_PROVIDER_SITE_OTHER): Payer: Medicare Other

## 2020-10-26 ENCOUNTER — Other Ambulatory Visit: Payer: Self-pay

## 2020-10-26 VITALS — BP 120/84 | HR 80 | Temp 99.0°F | Wt 208.4 lb

## 2020-10-26 DIAGNOSIS — M1611 Unilateral primary osteoarthritis, right hip: Secondary | ICD-10-CM | POA: Diagnosis not present

## 2020-10-26 DIAGNOSIS — M25551 Pain in right hip: Secondary | ICD-10-CM | POA: Diagnosis not present

## 2020-10-26 MED ORDER — KETOROLAC TROMETHAMINE 30 MG/ML IJ SOLN
30.0000 mg | Freq: Once | INTRAMUSCULAR | Status: AC
  Administered 2020-10-26: 30 mg via INTRAMUSCULAR

## 2020-10-26 NOTE — Addendum Note (Signed)
Addended by: Westley Hummer B on: 10/26/2020 04:21 PM   Modules accepted: Orders

## 2020-10-26 NOTE — Progress Notes (Signed)
Acute office Visit     This visit occurred during the SARS-CoV-2 public health emergency.  Safety protocols were in place, including screening questions prior to the visit, additional usage of staff PPE, and extensive cleaning of exam room while observing appropriate contact time as indicated for disinfecting solutions.    CC/Reason for Visit: Right hip pain  HPI: Tina Stone is a 70 y.o. female who is coming in today for the above mentioned reasons.  She started having hip pain about 6 weeks ago that has gotten progressively worse.  She is a side sleeper.  It hurts more over the anterior hip.  As the weather gets colder, it bothers her more.  No injury that she can recall.  Tylenol has not been helping.  Going up or down steps is really bothersome.  She has no radiculopathy, no bowel or bladder incontinence.   Past Medical/Surgical History: Past Medical History:  Diagnosis Date  . Adenomatous colon polyp   . Allergic rhinitis, seasonal   . Allergy   . Beta thalassemia trait 11/25/2013  . Cholelithiasis   . Class 3 obesity without serious comorbidity with body mass index (BMI) of 40.0 to 44.9 in adult 11/19/2012  . endometrial ca dx'd 08/2009   endometrial   . GERD (gastroesophageal reflux disease)   . HLD (hyperlipidemia)   . Hypercholesterolemia   . Hypertension 03/18/2017   no meds   . Intraductal papilloma of left breast    Patient underwent left needle-localized lumpectomy by Dr. Imogene Burn. [place] on 09/09/2013; pathology showed intraductal papilloma with no atypia or malignancy identified.  Marland Kitchen PONV (postoperative nausea and vomiting)   . Pre-diabetes    pt denies  . Uterine fibroid     Past Surgical History:  Procedure Laterality Date  . [place]   right  . BREAST EXCISIONAL BIOPSY Left 2014   benign  . BREAST LUMPECTOMY WITH NEEDLE LOCALIZATION Left 09/09/2013   Procedure: BREAST LUMPECTOMY WITH NEEDLE LOCALIZATION;  Surgeon: Imogene Burn.  Georgette Dover, MD;  Location: [facility];  Service: General;  Laterality: Left;  . CHOLECYSTECTOMY    . COLONOSCOPY    . IR IMAGING GUIDED PORT INSERTION  05/18/2020  . POLYPECTOMY    . ROBOTIC ASSISTED LAPAROSCOPIC CHOLECYSTECTOMY  09/09/2019  . ROBOTIC ASSISTED TOTAL HYSTERECTOMY WITH BILATERAL SALPINGO OOPHERECTOMY N/A 10/22/2019   Procedure: XI ROBOTIC ASSISTED TOTAL HYSTERECTOMY WITH BILATERAL SALPINGO OOPHORECTOMY GREATER THAN 250 GRAMS, MINI LAPARTOMY FOR SPECIMEN DELIVERY; PELVIC AND PERI-AORTIC LYMPHADENECTOMY;  Surgeon: Everitt Amber, MD;  Location: [facility];  Service: Gynecology;  Laterality: N/A;  . SENTINEL NODE BIOPSY N/A 10/22/2019   Procedure: SENTINEL NODE BIOPSY;  Surgeon: Everitt Amber, MD;  Location: [facility];  Service: Gynecology;  Laterality: N/A;    Social History:  reports that she has never smoked. She has never used smokeless tobacco. She reports that she does not drink alcohol and does not use drugs.  Allergies: Allergies  Allergen Reactions  . Benicar Hct [Olmesartan Medoxomil-Hctz] Shortness Of Breath and Palpitations  . Bactrim [Sulfamethoxazole-Trimethoprim] Other (See Comments)    Abdominal pain, dizziness  . Pravastatin Other (See Comments)    Lower abdominal pain  . Shellfish Allergy Nausea And Vomiting  . Amlodipine Palpitations  . Iodinated Diagnostic Agents Itching    Developed itching and hives after injection on 05/10/20; needs 13hr prep in future    Family History:  Family History  Problem Relation Age of Onset  . Diabetes Mother   .  Hypertension Mother   . Colon polyps Mother 36  . Dementia Mother 80  . Diabetes Father   . Congestive Heart Failure Father   . Pancreatic cancer Paternal Aunt   . Colon cancer Neg Hx   . Breast cancer Neg Hx   . Lung cancer Neg Hx   . Esophageal cancer Neg Hx   . Rectal cancer Neg Hx   . Stomach cancer Neg Hx      Current Outpatient Medications:  .  acetaminophen (TYLENOL) 500 MG tablet, Take 1,000 mg by mouth every 6  (six) hours as needed for moderate pain., Disp: , Rfl:  .  APPLE CIDER VINEGAR PO, Take 5 mLs by mouth daily. , Disp: , Rfl:  .  b complex vitamins tablet, Take 1 tablet by mouth daily., Disp: , Rfl:  .  Blood Pressure Monitoring (BLOOD PRESSURE KIT) DEVI, 1 Units by Does not apply route every morning., Disp: 1 each, Rfl: 0 .  calcium-vitamin D (OSCAL WITH D) 500-200 MG-UNIT tablet, Take 1 tablet by mouth daily with breakfast. , Disp: , Rfl:  .  dexamethasone (DECADRON) 4 MG tablet, Take 2 tabs at the night before and 2 tab the morning of chemotherapy, every 3 weeks, by mouth (Patient taking differently: Take 4 mg by mouth See admin instructions. Take 2 tabs at the night before chemotherapy.), Disp: 20 tablet, Rfl: 6 .  diphenhydrAMINE (BENADRYL) 25 MG tablet, Take 25 mg by mouth every 6 (six) hours as needed for allergies., Disp: , Rfl:  .  famotidine (PEPCID) 20 MG tablet, Take 20 mg by mouth 2 (two) times daily as needed for heartburn or indigestion., Disp: , Rfl:  .  loratadine (CLARITIN) 10 MG tablet, Take 10 mg by mouth daily as needed for allergies., Disp: , Rfl:  .  Multiple Vitamin (MULTIVITAMIN) capsule, Take 1 capsule by mouth daily., Disp: , Rfl:  .  ondansetron (ZOFRAN) 8 MG tablet, Take 1 tablet (8 mg total) by mouth every 8 (eight) hours as needed. Start on the third day after chemotherapy., Disp: 30 tablet, Rfl: 1 .  polyethylene glycol (MIRALAX / GLYCOLAX) 17 g packet, Take 17 g by mouth daily as needed for mild constipation., Disp: , Rfl:  .  prochlorperazine (COMPAZINE) 10 MG tablet, Take 1 tablet (10 mg total) by mouth every 6 (six) hours as needed (Nausea or vomiting)., Disp: 30 tablet, Rfl: 1  Current Facility-Administered Medications:  .  ketorolac (TORADOL) 30 MG/ML injection 30 mg, 30 mg, Intramuscular, Once, Isaac Bliss, Rayford Halsted, MD  Review of Systems:  Constitutional: Denies fever, chills, diaphoresis, appetite change and fatigue.  HEENT: Denies photophobia, eye  pain, redness, hearing loss, ear pain, congestion, sore throat, rhinorrhea, sneezing, mouth sores, trouble swallowing, neck pain, neck stiffness and tinnitus.   Respiratory: Denies SOB, DOE, cough, chest tightness,  and wheezing.   Cardiovascular: Denies chest pain, palpitations and leg swelling.  Gastrointestinal: Denies nausea, vomiting, abdominal pain, diarrhea, constipation, blood in stool and abdominal distention.  Genitourinary: Denies dysuria, urgency, frequency, hematuria, flank pain and difficulty urinating.  Endocrine: Denies: hot or cold intolerance, sweats, changes in hair or nails, polyuria, polydipsia. Musculoskeletal: Denies myalgias, back pain. Skin: Denies pallor, rash and wound.  Neurological: Denies dizziness, seizures, syncope, weakness, light-headedness, numbness and headaches.  Hematological: Denies adenopathy. Easy bruising, personal or family bleeding history  Psychiatric/Behavioral: Denies suicidal ideation, mood changes, confusion, nervousness, sleep disturbance and agitation    Physical Exam: Vitals:   10/26/20 1526  BP: 120/84  Pulse: 80  Temp: 99 F (37.2 C)  TempSrc: Oral  SpO2: 97%  Weight: 208 lb 6.4 oz (94.5 kg)    Body mass index is 36.92 kg/m.   Constitutional: NAD, calm, comfortable Eyes: PERRL, lids and conjunctivae normal ENMT: Mucous membranes are moist.  Musculoskeletal: no clubbing / cyanosis. No joint deformity upper and lower extremities. Good ROM, no contractures. Normal muscle tone.   Psychiatric: Normal judgment and insight. Alert and oriented x 3. Normal mood.    Impression and Plan:  Right hip pain  -Suspect osteoarthritis, although bursitis is also a possibility. -Due to pain she will receive an IM 30 mg Toradol injection in office today. -I will order hip x-rays and she will be referred to physical therapy. -At home she may do icing and ibuprofen as needed. -If pain continues consider referral to orthopedics.   Patient  Instructions  -Nice seeing you today!!  -Toradol injection in office today.  -Xrays.  -Referral for physical therapy will be requested.  -May use ice and ibuprofen/tylenol as needed.     Lelon Frohlich, MD [facility] at [facility]

## 2020-10-26 NOTE — Patient Instructions (Signed)
-  Nice seeing you today!!  -Toradol injection in office today.  -Xrays.  -Referral for physical therapy will be requested.  -May use ice and ibuprofen/tylenol as needed.

## 2020-10-26 NOTE — Addendum Note (Signed)
Addended by: Erline Hau on: 10/26/2020 04:23 PM   Modules accepted: Orders

## 2020-10-27 ENCOUNTER — Other Ambulatory Visit: Payer: Self-pay | Admitting: Internal Medicine

## 2020-10-27 ENCOUNTER — Encounter: Payer: Self-pay | Admitting: Internal Medicine

## 2020-10-27 DIAGNOSIS — E559 Vitamin D deficiency, unspecified: Secondary | ICD-10-CM

## 2020-10-27 LAB — VITAMIN D 25 HYDROXY (VIT D DEFICIENCY, FRACTURES): Vit D, 25-Hydroxy: 19 ng/mL — ABNORMAL LOW (ref 30–100)

## 2020-10-27 MED ORDER — VITAMIN D (ERGOCALCIFEROL) 1.25 MG (50000 UNIT) PO CAPS: 50000.0000 [IU] | ORAL_CAPSULE | ORAL | 0 refills | Status: DC

## 2020-10-27 MED FILL — VIT D2 1.25 MG (50,000 UNIT: 1.25 MG | 84 days supply | Qty: 12 | Fill #0

## 2020-10-31 ENCOUNTER — Telehealth: Payer: Self-pay | Admitting: *Deleted

## 2020-10-31 ENCOUNTER — Other Ambulatory Visit: Payer: Self-pay

## 2020-10-31 ENCOUNTER — Other Ambulatory Visit: Payer: Self-pay | Admitting: Internal Medicine

## 2020-10-31 ENCOUNTER — Inpatient Hospital Stay: Payer: Medicare Other | Attending: Gynecologic Oncology

## 2020-10-31 DIAGNOSIS — E559 Vitamin D deficiency, unspecified: Secondary | ICD-10-CM

## 2020-10-31 DIAGNOSIS — D563 Thalassemia minor: Secondary | ICD-10-CM | POA: Diagnosis not present

## 2020-10-31 DIAGNOSIS — Z23 Encounter for immunization: Secondary | ICD-10-CM | POA: Diagnosis not present

## 2020-10-31 DIAGNOSIS — C541 Malignant neoplasm of endometrium: Secondary | ICD-10-CM | POA: Insufficient documentation

## 2020-10-31 DIAGNOSIS — Z5111 Encounter for antineoplastic chemotherapy: Secondary | ICD-10-CM | POA: Diagnosis not present

## 2020-10-31 DIAGNOSIS — G893 Neoplasm related pain (acute) (chronic): Secondary | ICD-10-CM | POA: Diagnosis not present

## 2020-10-31 DIAGNOSIS — Z79899 Other long term (current) drug therapy: Secondary | ICD-10-CM | POA: Insufficient documentation

## 2020-10-31 DIAGNOSIS — M25551 Pain in right hip: Secondary | ICD-10-CM | POA: Insufficient documentation

## 2020-10-31 MED ORDER — INFLUENZA VAC A&B SA ADJ QUAD 0.5 ML IM PRSY
PREFILLED_SYRINGE | INTRAMUSCULAR | Status: AC
  Filled 2020-10-31: qty 0.5

## 2020-10-31 MED ORDER — INFLUENZA VAC A&B SA ADJ QUAD 0.5 ML IM PRSY
0.5000 mL | PREFILLED_SYRINGE | Freq: Once | INTRAMUSCULAR | Status: AC
  Administered 2020-10-31: 0.5 mL via INTRAMUSCULAR

## 2020-10-31 NOTE — Telephone Encounter (Signed)
Patient called with some follow up questions from her visit she had. Patient requested to talk to the nurse regarding this.

## 2020-10-31 NOTE — Patient Instructions (Signed)
Influenza Virus Vaccine injection What is this medicine? INFLUENZA VIRUS VACCINE (in floo EN zuh VAHY ruhs vak SEEN) helps to reduce the risk of getting influenza also known as the flu. The vaccine only helps protect you against some strains of the flu. This medicine may be used for other purposes; ask your health care provider or pharmacist if you have questions. COMMON BRAND NAME(S): Afluria, Afluria Quadrivalent, Agriflu, Alfuria, FLUAD, Fluarix, Fluarix Quadrivalent, Flublok, Flublok Quadrivalent, FLUCELVAX, FLUCELVAX Quadrivalent, Flulaval, Flulaval Quadrivalent, Fluvirin, Fluzone, Fluzone High-Dose, Fluzone Intradermal, Fluzone Quadrivalent What should I tell my health care provider before I take this medicine? They need to know if you have any of these conditions:  bleeding disorder like hemophilia  fever or infection  Guillain-Barre syndrome or other neurological problems  immune system problems  infection with the human immunodeficiency virus (HIV) or AIDS  low blood platelet counts  multiple sclerosis  an unusual or allergic reaction to influenza virus vaccine, latex, other medicines, foods, dyes, or preservatives. Different brands of vaccines contain different allergens. Some may contain latex or eggs. Talk to your doctor about your allergies to make sure that you get the right vaccine.  pregnant or trying to get pregnant  breast-feeding How should I use this medicine? This vaccine is for injection into a muscle or under the skin. It is given by a health care professional. A copy of Vaccine Information Statements will be given before each vaccination. Read this sheet carefully each time. The sheet may change frequently. Talk to your healthcare provider to see which vaccines are right for you. Some vaccines should not be used in all age groups. Overdosage: If you think you have taken too much of this medicine contact a poison control center or emergency room at once. NOTE:  This medicine is only for you. Do not share this medicine with others. What if I miss a dose? This does not apply. What may interact with this medicine?  chemotherapy or radiation therapy  medicines that lower your immune system like etanercept, anakinra, infliximab, and adalimumab  medicines that treat or prevent blood clots like warfarin  phenytoin  steroid medicines like prednisone or cortisone  theophylline  vaccines This list may not describe all possible interactions. Give your health care provider a list of all the medicines, herbs, non-prescription drugs, or dietary supplements you use. Also tell them if you smoke, drink alcohol, or use illegal drugs. Some items may interact with your medicine. What should I watch for while using this medicine? Report any side effects that do not go away within 3 days to your doctor or health care professional. Call your health care provider if any unusual symptoms occur within 6 weeks of receiving this vaccine. You may still catch the flu, but the illness is not usually as bad. You cannot get the flu from the vaccine. The vaccine will not protect against colds or other illnesses that may cause fever. The vaccine is needed every year. What side effects may I notice from receiving this medicine? Side effects that you should report to your doctor or health care professional as soon as possible:  allergic reactions like skin rash, itching or hives, swelling of the face, lips, or tongue Side effects that usually do not require medical attention (report to your doctor or health care professional if they continue or are bothersome):  fever  headache  muscle aches and pains  pain, tenderness, redness, or swelling at the injection site  tiredness This list may not describe  all possible side effects. Call your doctor for medical advice about side effects. You may report side effects to FDA at 1-800-FDA-1088. Where should I keep my medicine? The  vaccine will be given by a health care professional in a clinic, pharmacy, doctor's office, or other health care setting. You will not be given vaccine doses to store at home. NOTE: This sheet is a summary. It may not cover all possible information. If you have questions about this medicine, talk to your doctor, pharmacist, or health care provider.  2020 Elsevier/Gold Standard (2018-11-11 08:45:43)

## 2020-10-31 NOTE — Telephone Encounter (Signed)
Received a call from [place] at [facility]. XRay  Of hip on 10/26/2020 shows :   IMPRESSION: 1. No acute displaced fracture or dislocation. 2. Increased asymmetric lucency involving the proximal right femur when compared to the left, especially involving the greater trochanter. This is suspicious for an underlying lytic lesion, especially given the patient's history of prior malignancy. Follow-up with a contrast enhanced MRI is recommended for further evaluation of this finding.   Forwarding to PCP

## 2020-11-01 ENCOUNTER — Other Ambulatory Visit: Payer: Self-pay | Admitting: Internal Medicine

## 2020-11-01 ENCOUNTER — Telehealth: Payer: Self-pay | Admitting: Internal Medicine

## 2020-11-01 DIAGNOSIS — M25551 Pain in right hip: Secondary | ICD-10-CM

## 2020-11-01 NOTE — Progress Notes (Signed)
mri

## 2020-11-01 NOTE — Telephone Encounter (Signed)
Patient is aware 

## 2020-11-01 NOTE — Telephone Encounter (Signed)
She will need an MRI of right hip as requested by radiology to follow up on an abnormality from the xray. Please let her know that I will order.

## 2020-11-01 NOTE — Telephone Encounter (Signed)
Patient has questions about the medication she received yesterday.vere

## 2020-11-02 ENCOUNTER — Other Ambulatory Visit: Payer: Self-pay | Admitting: Internal Medicine

## 2020-11-02 DIAGNOSIS — M25551 Pain in right hip: Secondary | ICD-10-CM

## 2020-11-02 NOTE — Telephone Encounter (Signed)
Spoke with patient.

## 2020-11-04 ENCOUNTER — Other Ambulatory Visit: Payer: Self-pay

## 2020-11-04 ENCOUNTER — Telehealth: Payer: Self-pay

## 2020-11-04 MED ORDER — PREDNISONE 50 MG PO TABS: ORAL_TABLET | ORAL | 0 refills | Status: DC

## 2020-11-04 NOTE — Telephone Encounter (Signed)
Called and left a message to take Prednisone Rx 13 hour prep prior to scan. Rx sent to pharmacy.

## 2020-11-07 ENCOUNTER — Encounter (HOSPITAL_COMMUNITY): Payer: Self-pay

## 2020-11-07 ENCOUNTER — Inpatient Hospital Stay: Payer: Medicare Other

## 2020-11-07 ENCOUNTER — Ambulatory Visit (HOSPITAL_COMMUNITY)
Admission: RE | Admit: 2020-11-07 | Discharge: 2020-11-07 | Disposition: A | Discharge status: Home / Self Care | Payer: Medicare Other | Source: Ambulatory Visit | Attending: Hematology and Oncology | Admitting: Hematology and Oncology

## 2020-11-07 ENCOUNTER — Other Ambulatory Visit: Payer: Self-pay

## 2020-11-07 DIAGNOSIS — Z23 Encounter for immunization: Secondary | ICD-10-CM | POA: Diagnosis not present

## 2020-11-07 DIAGNOSIS — Z79899 Other long term (current) drug therapy: Secondary | ICD-10-CM | POA: Diagnosis not present

## 2020-11-07 DIAGNOSIS — M25551 Pain in right hip: Secondary | ICD-10-CM | POA: Diagnosis not present

## 2020-11-07 DIAGNOSIS — G893 Neoplasm related pain (acute) (chronic): Secondary | ICD-10-CM | POA: Diagnosis not present

## 2020-11-07 DIAGNOSIS — R590 Localized enlarged lymph nodes: Secondary | ICD-10-CM | POA: Diagnosis not present

## 2020-11-07 DIAGNOSIS — C541 Malignant neoplasm of endometrium: Secondary | ICD-10-CM | POA: Diagnosis not present

## 2020-11-07 DIAGNOSIS — I700 Atherosclerosis of aorta: Secondary | ICD-10-CM | POA: Diagnosis not present

## 2020-11-07 DIAGNOSIS — Z7189 Other specified counseling: Secondary | ICD-10-CM

## 2020-11-07 DIAGNOSIS — N281 Cyst of kidney, acquired: Secondary | ICD-10-CM | POA: Diagnosis not present

## 2020-11-07 DIAGNOSIS — Z5111 Encounter for antineoplastic chemotherapy: Secondary | ICD-10-CM | POA: Diagnosis not present

## 2020-11-07 LAB — CBC WITH DIFFERENTIAL (CANCER CENTER ONLY)
Abs Immature Granulocytes: 0.03 10*3/uL (ref 0.00–0.07)
Basophils Absolute: 0 10*3/uL (ref 0.0–0.1)
Basophils Relative: 0 %
Eosinophils Absolute: 0 10*3/uL (ref 0.0–0.5)
Eosinophils Relative: 0 %
HCT: 35.1 % — ABNORMAL LOW (ref 36.0–46.0)
Hemoglobin: 11.4 g/dL — ABNORMAL LOW (ref 12.0–15.0)
Immature Granulocytes: 1 %
Lymphocytes Relative: 13 %
Lymphs Abs: 0.5 10*3/uL — ABNORMAL LOW (ref 0.7–4.0)
MCH: 24.4 pg — ABNORMAL LOW (ref 26.0–34.0)
MCHC: 32.5 g/dL (ref 30.0–36.0)
MCV: 75 fL — ABNORMAL LOW (ref 80.0–100.0)
Monocytes Absolute: 0.1 10*3/uL (ref 0.1–1.0)
Monocytes Relative: 1 %
Neutro Abs: 3.6 10*3/uL (ref 1.7–7.7)
Neutrophils Relative %: 85 %
Platelet Count: 272 10*3/uL (ref 150–400)
RBC: 4.68 MIL/uL (ref 3.87–5.11)
RDW: 16.3 % — ABNORMAL HIGH (ref 11.5–15.5)
WBC Count: 4.2 10*3/uL (ref 4.0–10.5)
nRBC: 0 % (ref 0.0–0.2)

## 2020-11-07 LAB — CMP (CANCER CENTER ONLY)
ALT: 9 U/L (ref 0–44)
AST: 34 U/L (ref 15–41)
Albumin: 3.6 g/dL (ref 3.5–5.0)
Alkaline Phosphatase: 82 U/L (ref 38–126)
Anion gap: 10 (ref 5–15)
BUN: 13 mg/dL (ref 8–23)
CO2: 24 mmol/L (ref 22–32)
Calcium: 9.6 mg/dL (ref 8.9–10.3)
Chloride: 105 mmol/L (ref 98–111)
Creatinine: 0.81 mg/dL (ref 0.44–1.00)
GFR, Estimated: >60 mL/min (ref >60)
Glucose, Bld: 137 mg/dL — ABNORMAL HIGH (ref 70–99)
Potassium: 4 mmol/L (ref 3.5–5.1)
Sodium: 139 mmol/L (ref 135–145)
Total Bilirubin: 0.4 mg/dL (ref 0.3–1.2)
Total Protein: 7.7 g/dL (ref 6.5–8.1)

## 2020-11-07 MED ORDER — IOHEXOL 300 MG/ML  SOLN
100.0000 mL | Freq: Once | INTRAMUSCULAR | Status: AC | PRN
  Administered 2020-11-07: 100 mL via INTRAVENOUS

## 2020-11-07 MED ORDER — HEPARIN SOD (PORK) LOCK FLUSH 100 UNIT/ML IV SOLN
500.0000 [IU] | Freq: Once | INTRAVENOUS | Status: DC
  Filled 2020-11-07: qty 5

## 2020-11-07 MED ORDER — HEPARIN SOD (PORK) LOCK FLUSH 100 UNIT/ML IV SOLN
INTRAVENOUS | Status: AC
  Filled 2020-11-07: qty 5

## 2020-11-07 MED ORDER — HEPARIN SOD (PORK) LOCK FLUSH 100 UNIT/ML IV SOLN
500.0000 [IU] | Freq: Once | INTRAVENOUS | Status: AC
  Administered 2020-11-07: 500 [IU] via INTRAVENOUS

## 2020-11-07 MED ORDER — SODIUM CHLORIDE 0.9% FLUSH
10.0000 mL | Freq: Once | INTRAVENOUS | Status: AC
  Administered 2020-11-07: 10 mL
  Filled 2020-11-07: qty 10

## 2020-11-08 ENCOUNTER — Telehealth: Payer: Self-pay

## 2020-11-08 ENCOUNTER — Telehealth: Payer: Self-pay | Admitting: Pharmacist

## 2020-11-08 ENCOUNTER — Ambulatory Visit: Payer: Medicare Other | Admitting: Internal Medicine

## 2020-11-08 ENCOUNTER — Inpatient Hospital Stay (HOSPITAL_BASED_OUTPATIENT_CLINIC_OR_DEPARTMENT_OTHER): Payer: Medicare Other | Admitting: Hematology and Oncology

## 2020-11-08 ENCOUNTER — Encounter: Payer: Self-pay | Admitting: Hematology and Oncology

## 2020-11-08 ENCOUNTER — Other Ambulatory Visit: Payer: Self-pay | Admitting: Hematology and Oncology

## 2020-11-08 ENCOUNTER — Other Ambulatory Visit: Payer: Self-pay

## 2020-11-08 VITALS — BP 146/86 | HR 75 | Temp 97.3°F | Resp 18 | Ht 63.0 in | Wt 207.6 lb

## 2020-11-08 DIAGNOSIS — C787 Secondary malignant neoplasm of liver and intrahepatic bile duct: Secondary | ICD-10-CM

## 2020-11-08 DIAGNOSIS — Z23 Encounter for immunization: Secondary | ICD-10-CM | POA: Diagnosis not present

## 2020-11-08 DIAGNOSIS — C541 Malignant neoplasm of endometrium: Secondary | ICD-10-CM

## 2020-11-08 DIAGNOSIS — G893 Neoplasm related pain (acute) (chronic): Secondary | ICD-10-CM | POA: Insufficient documentation

## 2020-11-08 DIAGNOSIS — M25551 Pain in right hip: Secondary | ICD-10-CM | POA: Diagnosis not present

## 2020-11-08 DIAGNOSIS — Z7189 Other specified counseling: Secondary | ICD-10-CM | POA: Diagnosis not present

## 2020-11-08 DIAGNOSIS — Z5111 Encounter for antineoplastic chemotherapy: Secondary | ICD-10-CM | POA: Diagnosis not present

## 2020-11-08 DIAGNOSIS — Z79899 Other long term (current) drug therapy: Secondary | ICD-10-CM | POA: Diagnosis not present

## 2020-11-08 MED ORDER — LENVATINIB (20 MG DAILY DOSE) 2 X 10 MG PO CPPK: 10.0000 mg | ORAL_CAPSULE | Freq: Every day | ORAL | 11 refills | Status: DC

## 2020-11-08 MED ORDER — LENVIMA (10 MG DAILY DOSE) 10 MG PO CPPK: 10.0000 mg | ORAL_CAPSULE | Freq: Every day | ORAL | 11 refills | Status: DC

## 2020-11-08 MED ORDER — OXYCODONE HCL 5 MG PO TABS
5.0000 mg | ORAL_TABLET | ORAL | 0 refills | Status: DC | PRN
Start: 2020-11-08 — End: 2021-03-22

## 2020-11-08 MED FILL — oxyCODONE HCL 5 MG TABS: 5 | 5 days supply | Qty: 30 | Fill #0

## 2020-11-08 NOTE — Assessment & Plan Note (Signed)
Liver lesion has resolved We will repeat imaging study again in a few months for checkup

## 2020-11-08 NOTE — Progress Notes (Signed)
DISCONTINUE ON PATHWAY REGIMEN - Uterine     A cycle is every 21 days:     Paclitaxel      Carboplatin   **Always confirm dose/schedule in your pharmacy ordering system**  REASON: Disease Progression PRIOR TREATMENT: UTOS202: Carboplatin AUC=6 + Paclitaxel 175 mg/m2 q21 Days x 6 Cycles TREATMENT RESPONSE: Progressive Disease (PD)  START OFF PATHWAY REGIMEN - Uterine   [id number]:Lenvatinib 20 mg PO Daily D1-21 + Pembrolizumab 200 mg IV D1 q21 Days:   A cycle is every 21 days:     Lenvatinib      Pembrolizumab   **Always confirm dose/schedule in your pharmacy ordering system**  Patient Characteristics: Serous Carcinoma, Recurrent/Progressive Disease, Third Line and Beyond, HER2 Negative/Unknown Histology: Serous Carcinoma Therapeutic Status: Recurrent or Progressive Disease Line of Therapy: Third Line and Beyond HER2 Status: Negative Intent of Therapy: Non-Curative / Palliative Intent, Discussed with Patient

## 2020-11-08 NOTE — Assessment & Plan Note (Signed)
Unfortunately, CT imaging showed evidence of pelvic lymph node recurrence I do not believe these represent infection The pelvic lymph node closer to the right hip joint is causing pain and has been present in the last imaging study I recommend we proceed with palliative chemotherapy as soon as possible  We reviewed the current guidelines Goal is palliative I recommend switching her treatment with Lenvima and pembrolizumab.   The combination was approved following review conducted under [employer], an intiative of the [place] which provides a framework for concurrent submission and review of oncology drugs among international partners. The FDA approved the combination with the [place] Therapeutic Goods Administration and [facility] [place].   Efficacy of the drugs together was investigated in Study 111/KEYNOTE-146 ([id number]), a single-arm, multicenter, open-label, multi-cohort trial that enrolled 108 patients with metastatic endometrial cancer that had progressed following at least one prior systemic therapy in any setting.  Patients took 20 mg of lenvatinib orally once daily in combination with 200 mg of pembrolizumab administered intravenously every 3 weeks until unacceptable toxicity or disease progression.  Among the 108 patients, 94 had tumors that were not MSI-H or dMMR, 11 had tumors that were MSI-H or dMMR, and in 3 patients the tumor MSI-H or dMMR status was not known.  Results  The major efficacy outcome measures were objective response rate (ORR) and duration of response (DOR) by independent radiologic review committee using RECIST 1.1. The ORR in the 94 patients whose tumors were not MSI-H or dMMR was 38.3% (95% CI: 29%, 49%) with 10 complete responses (10.6%) and 26 partial responses (27.7%). Median DOR was not reached at the time of data cutoff and 25 patients (69% of responders) had response durations ?6 months.  In another updated publication published on  JCO: DOI: 10.1200/JCO.19.02627 Journal of Clinical Oncology, Published online March 13, 2019. Lenvatinib Plus Pembrolizumab in Patients With Advanced Endometrial Cancer Abstract PURPOSE  Patients with advanced endometrial carcinoma have limited treatment options. We report final primary efficacy analysis results for a patient cohort with advanced endometrial carcinoma receiving lenvatinib plus pembrolizumab in an ongoing phase Ib/II study of selected solid tumors. METHODS  Patients took lenvatinib 20 mg once daily orally plus pembrolizumab 200 mg intravenously once every 3 weeks, in 3-week cycles. The primary end point was objective response rate (ORR) at 24 weeks ([id number]); secondary efficacy end points included duration of response (DOR), progression-free survival (PFS), and overall survival (OS). Tumor assessments were evaluated by investigators per immune-related RECIST.  RESULTS  At data cutoff, 108 patients with previously treated endometrial carcinoma were enrolled, with a median follow-up of 18.7 months. The [id number] was 38.0% (95% CI, 28.8% to 47.8%). Among subgroups, the ZYYQM25 (95% CI) was 63.6% (30.8% to 89.1%) in patients with microsatellite instability (MSI)-high tumors (n = 11) and 36.2% (26.5% to 46.7%) in patients with microsatellite-stable tumors (n = 94). For previously treated patients, regardless of tumor MSI status, the median DOR was 21.2 months (95% CI, 7.6 months to not estimable), median PFS was 7.4 months (95% CI, 5.3 to 8.7 months), and median OS was 16.7 months (15.0 months to not estimable). Grade 3 or 4 treatment-related adverse events occurred in 83/124 (66.9%) patients. CONCLUSION  Lenvatinib plus pembrolizumab showed promising antitumor activity in patients with advanced endometrial carcinoma who have experienced disease progression after prior systemic therapy, regardless of tumor MSI status. The combination therapy had a manageable toxicity profile. The most common  adverse reactions for endometrial cancer were fatigue, hypertension, musculoskeletal pain,  diarrhea, decreased appetite, hypothyroidism, nausea, stomatitis, vomiting, decreased weight, abdominal pain, headache, constipation, urinary tract infection, dysphonia, hemorrhagic events, hypomagnesemia, palmar-plantar erythrodysesthesia, dyspnea, cough, and rash.   The most common adverse reactions for endometrial cancer were fatigue, hypertension, musculoskeletal pain, diarrhea, decreased appetite, hypothyroidism, nausea, stomatitis, vomiting, decreased weight, abdominal pain, headache, constipation, urinary tract infection, dysphonia, hemorrhagic events, hypomagnesemia, palmar-plantar erythrodysesthesia, dyspnea, cough, and rash.   After much discussion, the patient would like to proceed with the plan of care. I plan to see her again next week for further follow-up I plan upfront dose adjustment of Lenvima at 10 mg daily for better tolerance

## 2020-11-08 NOTE — Telephone Encounter (Signed)
Oral Oncology Pharmacist Encounter  Received new prescription for Lenvima (lenvatinib) for the treatment of advanced endometrial cancer in conjunction with pembrolizumab, planned duration until disease progression or unacceptable drug toxicity.  Prescription dose and frequency assessed for appropriateness. Per MD plan to start patient on dose reduction to improve medication tolerance.   CBC w/ Diff and CMP from 11/07/20 assessed, overall labs stable for treatment initiation - patient starting on dose reduction as mentioned above. Baseline TSH/T4 and total urine protein ordered prior to treatment initiation on 11/16/20  Current medication list in Epic reviewed, DDIs with Lenvima identified:  Category D DDI between [place] and Ondansetron - given that both agents can increase risk for QTc prolongation, caution use of both agents together. Will confirm with patient if she is still using ondansetron - would recommend patient use prochlorperazine PRN N/V while on therapy instead of ondansetron.   Evaluated chart and no patient barriers to medication adherence noted.   Prescription has been e-scribed to the [facility] for benefits analysis and approval.  Oral Oncology Clinic will continue to follow for insurance authorization, copayment issues, initial counseling and start date.  Leron Croak, PharmD, BCPS Hematology/Oncology Clinical Pharmacist [facility] [phone] 11/08/2020 10:52 AM

## 2020-11-08 NOTE — Telephone Encounter (Signed)
Oral Oncology Patient Advocate Encounter  After completing a benefits investigation, prior authorization for Lenvima is not required at this time through [place].  Patient's copay is $9.20  [place] Patient [place] Phone [phone] Fax [phone] 11/08/2020 10:42 AM

## 2020-11-08 NOTE — Progress Notes (Signed)
[place] OFFICE PROGRESS NOTE  Patient Care Team: Isaac Bliss, Rayford Halsted, MD as PCP - General (Internal Medicine) Axel Filler, MD  ASSESSMENT & PLAN:  Endometrial cancer [facility]) Unfortunately, CT imaging showed evidence of pelvic lymph node recurrence I do not believe these represent infection The pelvic lymph node closer to the right hip joint is causing pain and has been present in the last imaging study I recommend we proceed with palliative chemotherapy as soon as possible  We reviewed the current guidelines Goal is palliative I recommend switching her treatment with Lenvima and pembrolizumab.   The combination was approved following review conducted under [employer], an intiative of the [employer] which provides a framework for concurrent submission and review of oncology drugs among international partners. The FDA approved the combination with the [place] Therapeutic Goods Administration and [facility] [place].   Efficacy of the drugs together was investigated in Study 111/KEYNOTE-146 ([id number]), a single-arm, multicenter, open-label, multi-cohort trial that enrolled 108 patients with metastatic endometrial cancer that had progressed following at least one prior systemic therapy in any setting.  Patients took 20 mg of lenvatinib orally once daily in combination with 200 mg of pembrolizumab administered intravenously every 3 weeks until unacceptable toxicity or disease progression.  Among the 108 patients, 94 had tumors that were not MSI-H or dMMR, 11 had tumors that were MSI-H or dMMR, and in 3 patients the tumor MSI-H or dMMR status was not known.  Results  The major efficacy outcome measures were objective response rate (ORR) and duration of response (DOR) by independent radiologic review committee using RECIST 1.1. The ORR in the 94 patients whose tumors were not MSI-H or dMMR was 38.3% (95% CI: 29%, 49%) with 10 complete  responses (10.6%) and 26 partial responses (27.7%). Median DOR was not reached at the time of data cutoff and 25 patients (69% of responders) had response durations ?6 months.  In another updated publication published on JCO: DOI: 10.1200/JCO.19.02627 Journal of Clinical Oncology, Published online March 13, 2019. Lenvatinib Plus Pembrolizumab in Patients With Advanced Endometrial Cancer Abstract PURPOSE  Patients with advanced endometrial carcinoma have limited treatment options. We report final primary efficacy analysis results for a patient cohort with advanced endometrial carcinoma receiving lenvatinib plus pembrolizumab in an ongoing phase Ib/II study of selected solid tumors. METHODS  Patients took lenvatinib 20 mg once daily orally plus pembrolizumab 200 mg intravenously once every 3 weeks, in 3-week cycles. The primary end point was objective response rate (ORR) at 24 weeks ([id number]); secondary efficacy end points included duration of response (DOR), progression-free survival (PFS), and overall survival (OS). Tumor assessments were evaluated by investigators per immune-related RECIST.  RESULTS  At data cutoff, 108 patients with previously treated endometrial carcinoma were enrolled, with a median follow-up of 18.7 months. The [id number] was 38.0% (95% CI, 28.8% to 47.8%). Among subgroups, the [id number] (95% CI) was 63.6% (30.8% to 89.1%) in patients with microsatellite instability (MSI)-high tumors (n = 11) and 36.2% (26.5% to 46.7%) in patients with microsatellite-stable tumors (n = 94). For previously treated patients, regardless of tumor MSI status, the median DOR was 21.2 months (95% CI, 7.6 months to not estimable), median PFS was 7.4 months (95% CI, 5.3 to 8.7 months), and median OS was 16.7 months (15.0 months to not estimable). Grade 3 or 4 treatment-related adverse events occurred in 83/124 (66.9%) patients. CONCLUSION  Lenvatinib plus pembrolizumab showed promising antitumor activity in  patients with advanced endometrial carcinoma who  have experienced disease progression after prior systemic therapy, regardless of tumor MSI status. The combination therapy had a manageable toxicity profile. The most common adverse reactions for endometrial cancer were fatigue, hypertension, musculoskeletal pain, diarrhea, decreased appetite, hypothyroidism, nausea, stomatitis, vomiting, decreased weight, abdominal pain, headache, constipation, urinary tract infection, dysphonia, hemorrhagic events, hypomagnesemia, palmar-plantar erythrodysesthesia, dyspnea, cough, and rash.   The most common adverse reactions for endometrial cancer were fatigue, hypertension, musculoskeletal pain, diarrhea, decreased appetite, hypothyroidism, nausea, stomatitis, vomiting, decreased weight, abdominal pain, headache, constipation, urinary tract infection, dysphonia, hemorrhagic events, hypomagnesemia, palmar-plantar erythrodysesthesia, dyspnea, cough, and rash.   After much discussion, the patient would like to proceed with the plan of care. I plan to see her again next week for further follow-up I plan upfront dose adjustment of Lenvima at 10 mg daily for better tolerance   Metastasis to liver [facility]) Liver lesion has resolved We will repeat imaging study again in a few months for checkup  Goals of care, counseling/discussion Unfortunately, with recurrent disease, the goals of treatment is palliative in nature I recommend minimum 3 months of treatment before repeating imaging study and to decide further the role of maintenance treatment  Cancer associated pain She has cancer associated pain around the right hip area I recommend a trial of low-dose oxycodone I warned her about risk of constipation I will see her next week for assessment of pain control   Orders Placed This Encounter  Procedures  . CBC with Differential (Cancer Center Only)    Standing Status:   Standing    Number of Occurrences:   20     Standing Expiration Date:   11/08/2021  . CMP ([facility] only)    Standing Status:   Standing    Number of Occurrences:   20    Standing Expiration Date:   11/08/2021  . T4    Standing Status:   Standing    Number of Occurrences:   20    Standing Expiration Date:   11/08/2021  . TSH    Standing Status:   Standing    Number of Occurrences:   20    Standing Expiration Date:   11/08/2021  . Total Protein, Urine dipstick    Standing Status:   Standing    Number of Occurrences:   20    Standing Expiration Date:   11/08/2021    All questions were answered. The patient knows to call the clinic with any problems, questions or concerns. The total time spent in the appointment was 40 minutes encounter with patients including review of chart and various tests results, discussions about plan of care and coordination of care plan   [name], MD 11/08/2020 10:28 AM  INTERVAL HISTORY: Please see below for problem oriented charting. She returns for follow-up on imaging study She has given complaining of intermittent right hip pain not alleviated by Tylenol She denies recent infection on the right leg that could account for lymphadenopathy on the right pelvic area  SUMMARY OF ONCOLOGIC HISTORY: Oncology History Overview Note  Poorly differentiated carcinoma, mixed histology with squamous differentiation, rare focus of clear cells as well as serous features MSI stable Her2 negative   Endometrial cancer ([place])  09/01/2019 Initial Diagnosis   The patient reported a history of postmenopausal bleeding that began 1 to 2 months before diagnosis   09/14/2019 Imaging   US pelvis 1. Enlarged uterus with numerous myometrial masses, presumably fibroids. 2. Endometrial thickness of 6.2 mm. In the setting of post-menopausal bleeding,  endometrial sampling is indicated to exclude carcinoma. If results are benign, sonohysterogram should be considered for focal lesion work-up.  3. Nonvisualized ovaries    09/23/2019 Pathology Results   A. ENDOMETRIUM, BIOPSY:  - Poorly differentiated carcinoma   10/15/2019 Imaging   Ct scan of chest, abdomen and pelvis: No evidence of metastatic disease within the chest, abdomen, or pelvis.   Enlarged fibroid uterus.   Colonic diverticulosis. No radiographic evidence of diverticulitis.   Aortic and coronary artery atherosclerosis.     10/22/2019 Pathology Results   SURGICAL PATHOLOGY   FINAL MICROSCOPIC DIAGNOSIS:   A. UTERUS, BILATERAL TUBES AND OVARIES, HYSTERECTOMY:  Poorly differentiated carcinoma, 6.5 cm.  Lymphovascular involvement by tumor.  Carcinoma involves inner half of the myometrium.  Margins not involved.  Cervix, bilateral fallopian tubes and bilateral ovaries free of tumor.   B. LYMPH NODE, RIGHT EXTERNAL SENTINEL, BIOPSY:  One lymph node with no metastatic carcinoma (0/1).   C. LYMPH NODE, RIGHT PELVIC, BIOPSY  Five lymph nodes with no metastatic carcinoma (0/5).   D. LYMPH NODE, LEFT PELVIC, BIOPSY:  Five lymph nodes with no metastatic carcinoma (0/5).   E. LYMPH NODE, RIGHT PERI AORTIC, BIOPSY:  One lymph node with no metastatic carcinoma (0/1).   F. LYMPH NODE, LEFT PERI AORTIC, BIOPSY:  Five lymph nodes with no metastatic carcinoma (0/5).    ONCOLOGY TABLE:  UTERUS, CARCINOMA OR CARCINOSARCOMA   Procedure: Total hysterectomy with bilateral f-oophorectomy and sentinel  lymph nodes.  Histologic type: Poorly differentiated carcinoma, see comment.  Histologic Grade: High-grade, FIGO 3.  Myometrial invasion:       Depth of invasion: 13 mm       Myometrial thickness: 40 mm  Uterine Serosa Involvement: Not identified  Cervical stromal involvement: Not identified  Extent of involvement of other organs: Not identified  Lymphovascular invasion: Present  Regional Lymph Nodes:       Examined:     17 Sentinel                               0 non-sentinel                               17 total        Lymph nodes with  metastasis: 0        Isolated tumor cells (<0.2 mm): 0        Micrometastasis:  (>0.2 mm and < 2.0 mm): 0        Macrometastasis: (>2.0 mm): 0  Representative Tumor Block: A5, A6, A7 and A8.  MMR / MSI testing: Pending  Pathologic Stage Classification (pTNM, AJCC 8th edition):  pT1a, pN0  Comments: The carcinoma is a high-grade poorly differentiated carcinoma which morphologically has predominantly serous features.  There are a few foci with squamous differentiation and a rare focus of clear cell features.  Immunohistochemistry for cytokeratin AE1/AE3 is performed on the sentinel lymph nodes and no positivity is identified.    10/22/2019 Surgery   Pre-operative Diagnosis: endometrial cancer grade 3   Post-operative Diagnosis: same,    Operation: Robotic-assisted laparoscopic total hysterectomy for uterus >250gm with bilateral salpingoophorectomy, SLN mapping, bilateral pelvic and para-aortic lymphadenectomy.   Surgeon: Donaciano Eva    Operative Findings:  : 16cm bulky fibroid uterus, normal ovaries bilaterally, no suspicious lymph nodes.     11/10/2019 Cancer Staging   Staging  form: Corpus Uteri - Carcinoma and Carcinosarcoma, AJCC 8th Edition - Pathologic: Stage IVB (pT1a, pN0, cM1) - Signed by [name], MD on 05/16/2020   05/10/2020 Imaging   1. New hypodense 2.0 cm segment 4A left liver lobe mass, suspicious for hepatic metastasis. 2. New left pelvic sidewall 1.4 cm soft tissue nodule, suspicious for left internal iliac nodal metastasis. 3. New left vaginal cuff 1.6 x 1.3 cm soft tissue nodule, suspicious for recurrent tumor. 4. Aortic Atherosclerosis (ICD10-I70.0).   05/18/2020 Procedure   Successful placement of a right internal jugular approach power injectable Port-A-Cath. The catheter is ready for immediate use.   05/23/2020 -  Chemotherapy   The patient had carboplatin and taxol for chemotherapy treatment.     07/28/2020 Imaging   1. No findings identified to  suggest recurrent or metastatic disease. 2. Indeterminate and slightly exophytic lesion arising from the inferior pole of left kidney measures 0.8 cm. Technically this is too small to reliably characterize. Attention on follow-up imaging is advised. 3. Aortic atherosclerosis and LAD coronary artery atherosclerotic calcifications.   11/07/2020 Imaging   1. Interval progression of right pelvic sidewall lymphadenopathy, highly concerning for disease progression. PET-CT may prove helpful to further evaluate as clinically warranted. 2. Interval resolution of the previously identified hypodense lesion in segment IVA of the liver. 3. Stable 7 mm subcapsular lesion in the lower pole left kidney with attenuation higher than would be expected for a simple cyst but is too small to reliably characterize. Attention on follow-up recommended. 4. Aortic Atherosclerosis (ICD10-I70.0).   11/16/2020 -  Chemotherapy   The patient had lenvatinib 20 mg daily dose (LENVIMA) 2 x 10 MG capsule, 10 mg (100 % of original dose 10 mg), Oral, Daily, 0 of 1 cycle, Start date: 11/08/2020, End date: -- Dose modification: 10 mg (original dose 10 mg, Cycle 0) pembrolizumab (KEYTRUDA) 200 mg in sodium chloride 0.9 % 50 mL chemo infusion, 200 mg, Intravenous, Once, 0 of 6 cycles  for chemotherapy treatment.      REVIEW OF SYSTEMS:   Constitutional: Denies fevers, chills or abnormal weight loss Eyes: Denies blurriness of vision Ears, nose, mouth, throat, and face: Denies mucositis or sore throat Respiratory: Denies cough, dyspnea or wheezes Cardiovascular: Denies palpitation, chest discomfort or lower extremity swelling Gastrointestinal:  Denies nausea, heartburn or change in bowel habits Skin: Denies abnormal skin rashes Lymphatics: Denies new lymphadenopathy or easy bruising Neurological:Denies numbness, tingling or new weaknesses Behavioral/Psych: Mood is stable, no new changes  All other systems were reviewed with the  patient and are negative.  I have reviewed the past medical history, past surgical history, social history and family history with the patient and they are unchanged from previous note.  ALLERGIES:  is allergic to benicar hct [olmesartan medoxomil-hctz], bactrim [sulfamethoxazole-trimethoprim], pravastatin, shellfish allergy, amlodipine, and iodinated diagnostic agents.  MEDICATIONS:  Current Outpatient Medications  Medication Sig Dispense Refill  . acetaminophen (TYLENOL) 500 MG tablet Take 1,000 mg by mouth every 6 (six) hours as needed for moderate pain.    . APPLE CIDER VINEGAR PO Take 5 mLs by mouth daily.     Marland Kitchen b complex vitamins tablet Take 1 tablet by mouth daily.    . Blood Pressure Monitoring (BLOOD PRESSURE KIT) DEVI 1 Units by Does not apply route every morning. 1 each 0  . calcium-vitamin D (OSCAL WITH D) 500-200 MG-UNIT tablet Take 1 tablet by mouth daily with breakfast.     . diphenhydrAMINE (BENADRYL) 25 MG tablet Take 25  mg by mouth every 6 (six) hours as needed for allergies.    . famotidine (PEPCID) 20 MG tablet Take 20 mg by mouth 2 (two) times daily as needed for heartburn or indigestion.    Marland Kitchen lenvatinib 20 mg daily dose (LENVIMA) 2 x 10 MG capsule Take 1 capsule (10 mg total) by mouth daily. 30 capsule 11  . loratadine (CLARITIN) 10 MG tablet Take 10 mg by mouth daily as needed for allergies.    . Multiple Vitamin (MULTIVITAMIN) capsule Take 1 capsule by mouth daily.    . ondansetron (ZOFRAN) 8 MG tablet Take 1 tablet (8 mg total) by mouth every 8 (eight) hours as needed. Start on the third day after chemotherapy. 30 tablet 1  . oxyCODONE (OXY IR/ROXICODONE) 5 MG immediate release tablet Take 1 tablet (5 mg total) by mouth every 4 (four) hours as needed for severe pain. 30 tablet 0  . polyethylene glycol (MIRALAX / GLYCOLAX) 17 g packet Take 17 g by mouth daily as needed for mild constipation.    . prochlorperazine (COMPAZINE) 10 MG tablet Take 1 tablet (10 mg total) by  mouth every 6 (six) hours as needed (Nausea or vomiting). 30 tablet 1  . Vitamin D, Ergocalciferol, (DRISDOL) 1.25 MG (50000 UNIT) CAPS capsule Take 1 capsule (50,000 Units total) by mouth every 7 (seven) days for 12 doses. 12 capsule 0   No current facility-administered medications for this visit.    PHYSICAL EXAMINATION: ECOG PERFORMANCE STATUS: 1 - Symptomatic but completely ambulatory  Vitals:   11/08/20 0919  BP: (!) 146/86  Pulse: 75  Resp: 18  Temp: (!) 97.3 F (36.3 C)  SpO2: 100%   Filed Weights   11/08/20 0919  Weight: 207 lb 9.6 oz (94.2 kg)    GENERAL:alert, no distress and comfortable NEURO: alert & oriented x 3 with fluent speech, no focal motor/sensory deficits  LABORATORY DATA:  I have reviewed the data as listed    Component Value Date/Time   NA 139 11/07/2020 0815   NA 140 07/04/2017 0952   K 4.0 11/07/2020 0815   CL 105 11/07/2020 0815   CO2 24 11/07/2020 0815   GLUCOSE 137 (H) 11/07/2020 0815   BUN 13 11/07/2020 0815   BUN 10 07/04/2017 0952   CREATININE 0.81 11/07/2020 0815   CREATININE 0.81 03/23/2015 0946   CALCIUM 9.6 11/07/2020 0815   PROT 7.7 11/07/2020 0815   ALBUMIN 3.6 11/07/2020 0815   AST 34 11/07/2020 0815   ALT 9 11/07/2020 0815   ALKPHOS 82 11/07/2020 0815   BILITOT 0.4 11/07/2020 0815   GFRNONAA >60 11/07/2020 0815   GFRNONAA 78 03/23/2015 0946   GFRAA >60 09/12/2020 0853   GFRAA >89 03/23/2015 0946    No results found for: SPEP, UPEP  Lab Results  Component Value Date   WBC 4.2 11/07/2020   NEUTROABS 3.6 11/07/2020   HGB 11.4 (L) 11/07/2020   HCT 35.1 (L) 11/07/2020   MCV 75.0 (L) 11/07/2020   PLT 272 11/07/2020      Chemistry      Component Value Date/Time   NA 139 11/07/2020 0815   NA 140 07/04/2017 0952   K 4.0 11/07/2020 0815   CL 105 11/07/2020 0815   CO2 24 11/07/2020 0815   BUN 13 11/07/2020 0815   BUN 10 07/04/2017 0952   CREATININE 0.81 11/07/2020 0815   CREATININE 0.81 03/23/2015 0946       Component Value Date/Time   CALCIUM 9.6 11/07/2020 0815  ALKPHOS 82 11/07/2020 0815   AST 34 11/07/2020 0815   ALT 9 11/07/2020 0815   BILITOT 0.4 11/07/2020 0815       RADIOGRAPHIC STUDIES: I have reviewed multiple imaging studies with the patient I have personally reviewed the radiological images as listed and agreed with the findings in the report. CT ABDOMEN PELVIS W CONTRAST  Result Date: 11/07/2020 CLINICAL DATA:  Endometrial cancer.  Restaging. EXAM: CT ABDOMEN AND PELVIS WITH CONTRAST TECHNIQUE: Multidetector CT imaging of the abdomen and pelvis was performed using the standard protocol following bolus administration of intravenous contrast. CONTRAST:  154m OMNIPAQUE IOHEXOL 300 MG/ML  SOLN COMPARISON:  07/28/2020 FINDINGS: Lower chest: Unremarkable. Hepatobiliary: Scattered small hypodensities in the liver parenchyma again noted, compatible with cysts. A hypodense lesion in segment IV A identified on the 05/10/2020 exam and decreased on the 07/28/2020 exam has resolved completely in the interval. No new suspicious liver abnormality. Gallbladder surgically absent. No intrahepatic or extrahepatic biliary dilation. Pancreas: No focal mass lesion. No dilatation of the main duct. No intraparenchymal cyst. No peripancreatic edema. Spleen: No splenomegaly. No focal mass lesion. Adrenals/Urinary Tract: No adrenal nodule or mass. 7 mm hypoattenuating lesion in the interpolar right kidney is stable. Previously identified subcapsular 7 mm lesion in the lower pole left kidney has attenuation higher than would be expected for a simple cyst but is too small to reliably characterize. This lesion is stable in the interval. No evidence for hydroureter. The urinary bladder appears normal for the degree of distention. Stomach/Bowel: Stomach is unremarkable. No gastric wall thickening. No evidence of outlet obstruction. Duodenum is normally positioned as is the ligament of Treitz. No small bowel wall  thickening. No small bowel dilatation. The terminal ileum is normal. The appendix is not visualized, but there is no edema or inflammation in the region of the cecum. No gross colonic mass. No colonic wall thickening. Diverticular changes are noted in the left colon without evidence of diverticulitis. Vascular/Lymphatic: There is abdominal aortic atherosclerosis without aneurysm. There is no gastrohepatic or hepatoduodenal ligament lymphadenopathy. No retroperitoneal or mesenteric lymphadenopathy. 7 mm short axis right pelvic sidewall lymph node on the previous exam has progressed in the interval, now measuring 2.3 cm short axis by 3.2 cm long axis. A cluster of lymph nodes in the right external iliac chain on 67/2 measures 2.4 x 2.2 cm, progressive in the interval. 1.1 cm short axis right groin lymph node was 0.7 cm previously. 6 mm short axis left common femoral node on 73/2 is stable in the interval. Reproductive: Uterus surgically absent.  There is no adnexal mass. Other: No intraperitoneal free fluid. Musculoskeletal: No worrisome lytic or sclerotic osseous abnormality. Sclerosis in both SI joint suggests degenerative change. IMPRESSION: 1. Interval progression of right pelvic sidewall lymphadenopathy, highly concerning for disease progression. PET-CT may prove helpful to further evaluate as clinically warranted. 2. Interval resolution of the previously identified hypodense lesion in segment IVA of the liver. 3. Stable 7 mm subcapsular lesion in the lower pole left kidney with attenuation higher than would be expected for a simple cyst but is too small to reliably characterize. Attention on follow-up recommended. 4. Aortic Atherosclerosis (ICD10-I70.0). Electronically Signed   By: EMisty StanleyM.D.   On: 11/07/2020 13:06   DG Hip Unilat W OR W/O Pelvis 2-3 Views Right  Result Date: 10/28/2020 CLINICAL DATA:  Right hip pain x2 months EXAM: DG HIP (WITH OR WITHOUT PELVIS) 2-3V RIGHT COMPARISON:  CT dated  July 28, 2020 FINDINGS: There  is no acute displaced fracture or dislocation. Mild to moderate bilateral hip osteoarthritis is noted. There is subtle increased lucency in the proximal right femur, especially the greater trochanter when compared to the left. IMPRESSION: 1. No acute displaced fracture or dislocation. 2. Increased asymmetric lucency involving the proximal right femur when compared to the left, especially involving the greater trochanter. This is suspicious for an underlying lytic lesion, especially given the patient's history of prior malignancy. Follow-up with a contrast enhanced MRI is recommended for further evaluation of this finding. These results will be called to the ordering clinician or representative by the Radiologist Assistant, and communication documented in the PACS or [employer]. Electronically Signed   By: Constance Holster M.D.   On: 10/28/2020 20:11

## 2020-11-08 NOTE — Assessment & Plan Note (Signed)
Unfortunately, with recurrent disease, the goals of treatment is palliative in nature I recommend minimum 3 months of treatment before repeating imaging study and to decide further the role of maintenance treatment

## 2020-11-08 NOTE — Assessment & Plan Note (Signed)
She has cancer associated pain around the right hip area I recommend a trial of low-dose oxycodone I warned her about risk of constipation I will see her next week for assessment of pain control

## 2020-11-09 MED FILL — LENVIMA 10 MG DAILY DOSE: 10 | 30 days supply | Qty: 30 | Fill #0

## 2020-11-09 NOTE — Telephone Encounter (Signed)
Oral Chemotherapy Pharmacist Encounter  I spoke with patient for overview of: Lenvima (lenvatinib) for the treatment of advanced endometrial cancer in conjunction with pembrolizumab, planned duration until disease progression or unacceptable toxicity.   Counseled patient on administration, dosing, side effects, monitoring, drug-food interactions, safe handling, storage, and disposal.  Patient will take Lenvima 10mg  capsules, 1 capsule (10mg ) by mouth once daily, with or without food, at approximately the same time each day.  Lenvima start date: 11/16/20  Adverse effects include but are not limited to: hypertension, hand-foot syndrome, diarrhea, joint pain, fatigue, headache, decreased calcium, proteinuria, increased risk of blood clots, and cardiac conduction issues.   Patient will obtain anti diarrheal and alert the office of 4 or more loose stools above baseline.  Patient instructed to notify office of any upcoming invasive procedures.  Michel Santee will be held for 6 days prior to scheduled surgery, restart based on healing and clinical judgement.   Reviewed with patient importance of keeping a medication schedule and plan for any missed doses. No barriers to medication adherence identified.  Medication reconciliation performed and medication/allergy list updated. We discussed QTc prolongation risk with Lenvima when used in combination with ondansetron. Ms. Lope stated she does not use the ondansetron.  Insurance authorization for Michel Santee has been obtained. Test claim at the pharmacy revealed copayment $9.20 for 1st fill of Lenvima.  Patient will pick up Lenvima from the [facility] outpatient pharmacy on 11/09/20. She knows not to start the medication until 11/16/20.  Patient informed the pharmacy will reach out 5-7 days prior to needing next fill of Lenvima to coordinate continued medication acquisition to prevent break in therapy.  All questions answered.  Ms. Justiss voiced  understanding and appreciation.   Medication education handout placed in mail for patient. Patient knows to call the office with questions or concerns. Oral Chemotherapy Clinic phone number provided to patient.   Leron Croak, PharmD, BCPS Hematology/Oncology Clinical Pharmacist [facility] [phone] 11/09/2020 11:15 AM

## 2020-11-10 NOTE — Progress Notes (Signed)
Pharmacist Chemotherapy Monitoring - Initial Assessment    Anticipated start date: 11/16/2020   Regimen:  . Are orders appropriate based on the patient's diagnosis, regimen, and cycle? Yes . Does the plan date match the patient's scheduled date? Yes . Is the sequencing of drugs appropriate? Yes . Are the premedications appropriate for the patient's regimen? Yes . Prior Authorization for treatment is: Pending o If applicable, is the correct biosimilar selected based on the patient's insurance? not applicable  Organ Function and Labs: Marland Kitchen Are dose adjustments needed based on the patient's renal function, hepatic function, or hematologic function? Yes . Are appropriate labs ordered prior to the start of patient's treatment? Yes . Other organ system assessment, if indicated: N/A . The following baseline labs, if indicated, have been ordered: pembrolizumab tsh  Dose Assessment: . Are the drug doses appropriate? Yes . Are the following correct: o Drug concentrations Yes o IV fluid compatible with drug Yes o Administration routes Yes o Timing of therapy Yes . If applicable, does the patient have documented access for treatment and/or plans for port-a-cath placement? not applicable . If applicable, have lifetime cumulative doses been properly documented and assessed? not applicable Lifetime Dose Tracking  . Carboplatin: 2,550 mg = 0.01 % of the maximum lifetime dose of 999,999,999 mg  o   Toxicity Monitoring/Prevention: . The patient has the following take home antiemetics prescribed: Ondansetron and Prochlorperazine . The patient has the following take home medications prescribed: N/A . Medication allergies and previous infusion related reactions, if applicable, have been reviewed and addressed. No . The patient's current medication list has been assessed for drug-drug interactions with their chemotherapy regimen. no significant drug-drug interactions were identified on review.  Order  Review: . Are the treatment plan orders signed? Yes . Is the patient scheduled to see a provider prior to their treatment? Yes  I verify that I have reviewed each item in the above checklist and answered each question accordingly.  Regnald Bowens D 11/10/2020 11:54 AM

## 2020-11-16 ENCOUNTER — Inpatient Hospital Stay: Payer: Medicare Other

## 2020-11-16 ENCOUNTER — Other Ambulatory Visit: Payer: Self-pay

## 2020-11-16 ENCOUNTER — Inpatient Hospital Stay (HOSPITAL_BASED_OUTPATIENT_CLINIC_OR_DEPARTMENT_OTHER): Payer: Medicare Other | Admitting: Hematology and Oncology

## 2020-11-16 ENCOUNTER — Encounter: Payer: Self-pay | Admitting: Hematology and Oncology

## 2020-11-16 DIAGNOSIS — M25551 Pain in right hip: Secondary | ICD-10-CM | POA: Diagnosis not present

## 2020-11-16 DIAGNOSIS — I10 Essential (primary) hypertension: Secondary | ICD-10-CM | POA: Insufficient documentation

## 2020-11-16 DIAGNOSIS — C541 Malignant neoplasm of endometrium: Secondary | ICD-10-CM

## 2020-11-16 DIAGNOSIS — G893 Neoplasm related pain (acute) (chronic): Secondary | ICD-10-CM

## 2020-11-16 DIAGNOSIS — R030 Elevated blood-pressure reading, without diagnosis of hypertension: Secondary | ICD-10-CM

## 2020-11-16 DIAGNOSIS — Z7189 Other specified counseling: Secondary | ICD-10-CM

## 2020-11-16 DIAGNOSIS — D563 Thalassemia minor: Secondary | ICD-10-CM

## 2020-11-16 DIAGNOSIS — Z5111 Encounter for antineoplastic chemotherapy: Secondary | ICD-10-CM | POA: Diagnosis not present

## 2020-11-16 DIAGNOSIS — Z23 Encounter for immunization: Secondary | ICD-10-CM | POA: Diagnosis not present

## 2020-11-16 DIAGNOSIS — Z79899 Other long term (current) drug therapy: Secondary | ICD-10-CM | POA: Diagnosis not present

## 2020-11-16 LAB — CMP (CANCER CENTER ONLY)
ALT: <6 U/L (ref 0–44)
AST: 41 U/L (ref 15–41)
Albumin: 3.5 g/dL (ref 3.5–5.0)
Alkaline Phosphatase: 87 U/L (ref 38–126)
Anion gap: 8 (ref 5–15)
BUN: 12 mg/dL (ref 8–23)
CO2: 26 mmol/L (ref 22–32)
Calcium: 9.2 mg/dL (ref 8.9–10.3)
Chloride: 103 mmol/L (ref 98–111)
Creatinine: 0.82 mg/dL (ref 0.44–1.00)
GFR, Estimated: >60 mL/min (ref >60)
Glucose, Bld: 103 mg/dL — ABNORMAL HIGH (ref 70–99)
Potassium: 3.9 mmol/L (ref 3.5–5.1)
Sodium: 137 mmol/L (ref 135–145)
Total Bilirubin: 0.3 mg/dL (ref 0.3–1.2)
Total Protein: 7.2 g/dL (ref 6.5–8.1)

## 2020-11-16 LAB — CBC WITH DIFFERENTIAL (CANCER CENTER ONLY)
Abs Immature Granulocytes: 0.02 10*3/uL (ref 0.00–0.07)
Basophils Absolute: 0 10*3/uL (ref 0.0–0.1)
Basophils Relative: 0 %
Eosinophils Absolute: 0.1 10*3/uL (ref 0.0–0.5)
Eosinophils Relative: 1 %
HCT: 33.2 % — ABNORMAL LOW (ref 36.0–46.0)
Hemoglobin: 10.8 g/dL — ABNORMAL LOW (ref 12.0–15.0)
Immature Granulocytes: 0 %
Lymphocytes Relative: 32 %
Lymphs Abs: 1.4 10*3/uL (ref 0.7–4.0)
MCH: 24.7 pg — ABNORMAL LOW (ref 26.0–34.0)
MCHC: 32.5 g/dL (ref 30.0–36.0)
MCV: 75.8 fL — ABNORMAL LOW (ref 80.0–100.0)
Monocytes Absolute: 0.4 10*3/uL (ref 0.1–1.0)
Monocytes Relative: 8 %
Neutro Abs: 2.6 10*3/uL (ref 1.7–7.7)
Neutrophils Relative %: 59 %
Platelet Count: 234 10*3/uL (ref 150–400)
RBC: 4.38 MIL/uL (ref 3.87–5.11)
RDW: 16.1 % — ABNORMAL HIGH (ref 11.5–15.5)
WBC Count: 4.5 10*3/uL (ref 4.0–10.5)
nRBC: 0 % (ref 0.0–0.2)

## 2020-11-16 LAB — TOTAL PROTEIN, URINE DIPSTICK: Protein, ur: NEGATIVE mg/dL

## 2020-11-16 LAB — TSH: TSH: 2.638 u[IU]/mL (ref 0.308–3.960)

## 2020-11-16 MED ORDER — SODIUM CHLORIDE 0.9% FLUSH
10.0000 mL | Freq: Once | INTRAVENOUS | Status: AC
  Administered 2020-11-16: 10 mL
  Filled 2020-11-16: qty 10

## 2020-11-16 MED ORDER — HEPARIN SOD (PORK) LOCK FLUSH 100 UNIT/ML IV SOLN
500.0000 [IU] | Freq: Once | INTRAVENOUS | Status: AC | PRN
  Administered 2020-11-16: 500 [IU]
  Filled 2020-11-16: qty 5

## 2020-11-16 MED ORDER — SODIUM CHLORIDE 0.9 % IV SOLN
Freq: Once | INTRAVENOUS | Status: AC
  Filled 2020-11-16: qty 250

## 2020-11-16 MED ORDER — SODIUM CHLORIDE 0.9 % IV SOLN
200.0000 mg | Freq: Once | INTRAVENOUS | Status: AC
  Administered 2020-11-16: 200 mg via INTRAVENOUS
  Filled 2020-11-16: qty 8

## 2020-11-16 MED ORDER — SODIUM CHLORIDE 0.9% FLUSH
10.0000 mL | INTRAVENOUS | Status: DC | PRN
  Administered 2020-11-16: 10 mL
  Filled 2020-11-16: qty 10

## 2020-11-16 NOTE — Assessment & Plan Note (Signed)
She has taken diuretic with hydrochlorothiazide in the past but not recently Her blood pressure is noted to be elevated but could be exacerbated by pain or anxiety I recommend the patient to take her blood pressure twice a day and we will call her next week for further follow-up If her blood pressure remains elevated, we will resume taking hydrochlorothiazide

## 2020-11-16 NOTE — Assessment & Plan Note (Signed)
She is anxious to begin chemotherapy She still have persistent right hip pain but is somewhat reluctant to take pain medicine I recommend she checks her blood pressure twice a day at home and we will call her next week for toxicity review over the telephone

## 2020-11-16 NOTE — Progress Notes (Signed)
[facility] OFFICE PROGRESS NOTE  Patient Care Team: Isaac Bliss, Rayford Halsted, MD as PCP - General (Internal Medicine) Axel Filler, MD  ASSESSMENT & PLAN:  Endometrial cancer [facility]) She is anxious to begin chemotherapy She still have persistent right hip pain but is somewhat reluctant to take pain medicine I recommend she checks her blood pressure twice a day at home and we will call her next week for toxicity review over the telephone  Cancer associated pain We discussed the risk and benefits of taking narcotic prescription for pain She is somewhat reluctant She will continue acetaminophen during daytime but I recommend oxycodone for severe pain  Elevated blood pressure reading in office without diagnosis of hypertension She has taken diuretic with hydrochlorothiazide in the past but not recently Her blood pressure is noted to be elevated but could be exacerbated by pain or anxiety I recommend the patient to take her blood pressure twice a day and we will call her next week for further follow-up If her blood pressure remains elevated, we will resume taking hydrochlorothiazide  Beta thalassemia trait She has chronic stable anemia Observe for now   No orders of the defined types were placed in this encounter.   All questions were answered. The patient knows to call the clinic with any problems, questions or concerns. The total time spent in the appointment was 20 minutes encounter with patients including review of chart and various tests results, discussions about plan of care and coordination of care plan   [name], MD 11/16/2020 2:58 PM  INTERVAL HISTORY: Please see below for problem oriented charting. She is here today to review plan of care and to start treatment Since last time I saw her, I prescribed oxycodone but she has not taken it She is only taking acetaminophen She continues to have right hip pain Her blood pressure is noted to be  elevated but she thought could be related to anxiety She have HCTZ to take at home if needed  SUMMARY OF ONCOLOGIC HISTORY: Oncology History Overview Note  Poorly differentiated carcinoma, mixed histology with squamous differentiation, rare focus of clear cells as well as serous features MSI stable Her2 negative   Endometrial cancer ([place])  09/01/2019 Initial Diagnosis   The patient reported a history of postmenopausal bleeding that began 1 to 2 months before diagnosis   09/14/2019 Imaging   US pelvis 1. Enlarged uterus with numerous myometrial masses, presumably fibroids. 2. Endometrial thickness of 6.2 mm. In the setting of post-menopausal bleeding, endometrial sampling is indicated to exclude carcinoma. If results are benign, sonohysterogram should be considered for focal lesion work-up.  3. Nonvisualized ovaries   09/23/2019 Pathology Results   A. ENDOMETRIUM, BIOPSY:  - Poorly differentiated carcinoma   10/15/2019 Imaging   Ct scan of chest, abdomen and pelvis: No evidence of metastatic disease within the chest, abdomen, or pelvis.   Enlarged fibroid uterus.   Colonic diverticulosis. No radiographic evidence of diverticulitis.   Aortic and coronary artery atherosclerosis.     10/22/2019 Pathology Results   SURGICAL PATHOLOGY   FINAL MICROSCOPIC DIAGNOSIS:   A. UTERUS, BILATERAL TUBES AND OVARIES, HYSTERECTOMY:  Poorly differentiated carcinoma, 6.5 cm.  Lymphovascular involvement by tumor.  Carcinoma involves inner half of the myometrium.  Margins not involved.  Cervix, bilateral fallopian tubes and bilateral ovaries free of tumor.   B. LYMPH NODE, RIGHT EXTERNAL SENTINEL, BIOPSY:  One lymph node with no metastatic carcinoma (0/1).   C. LYMPH NODE, RIGHT PELVIC, BIOPSY  Five lymph nodes with no metastatic carcinoma (0/5).   D. LYMPH NODE, LEFT PELVIC, BIOPSY:  Five lymph nodes with no metastatic carcinoma (0/5).   E. LYMPH NODE, RIGHT PERI AORTIC, BIOPSY:  One  lymph node with no metastatic carcinoma (0/1).   F. LYMPH NODE, LEFT PERI AORTIC, BIOPSY:  Five lymph nodes with no metastatic carcinoma (0/5).    ONCOLOGY TABLE:  UTERUS, CARCINOMA OR CARCINOSARCOMA   Procedure: Total hysterectomy with bilateral f-oophorectomy and sentinel  lymph nodes.  Histologic type: Poorly differentiated carcinoma, see comment.  Histologic Grade: High-grade, FIGO 3.  Myometrial invasion:       Depth of invasion: 13 mm       Myometrial thickness: 40 mm  Uterine Serosa Involvement: Not identified  Cervical stromal involvement: Not identified  Extent of involvement of other organs: Not identified  Lymphovascular invasion: Present  Regional Lymph Nodes:       Examined:     17 Sentinel                               0 non-sentinel                               17 total        Lymph nodes with metastasis: 0        Isolated tumor cells (<0.2 mm): 0        Micrometastasis:  (>0.2 mm and < 2.0 mm): 0        Macrometastasis: (>2.0 mm): 0  Representative Tumor Block: A5, A6, A7 and A8.  MMR / MSI testing: Pending  Pathologic Stage Classification (pTNM, AJCC 8th edition):  pT1a, pN0  Comments: The carcinoma is a high-grade poorly differentiated carcinoma which morphologically has predominantly serous features.  There are a few foci with squamous differentiation and a rare focus of clear cell features.  Immunohistochemistry for cytokeratin AE1/AE3 is performed on the sentinel lymph nodes and no positivity is identified.    10/22/2019 Surgery   Pre-operative Diagnosis: endometrial cancer grade 3   Post-operative Diagnosis: same,    Operation: Robotic-assisted laparoscopic total hysterectomy for uterus >250gm with bilateral salpingoophorectomy, SLN mapping, bilateral pelvic and para-aortic lymphadenectomy.   Surgeon: Donaciano Eva    Operative Findings:  : 16cm bulky fibroid uterus, normal ovaries bilaterally, no suspicious lymph nodes.     11/10/2019 Cancer  Staging   Staging form: Corpus Uteri - Carcinoma and Carcinosarcoma, AJCC 8th Edition - Pathologic: Stage IVB (pT1a, pN0, cM1) - Signed by [name], MD on 05/16/2020   05/10/2020 Imaging   1. New hypodense 2.0 cm segment 4A left liver lobe mass, suspicious for hepatic metastasis. 2. New left pelvic sidewall 1.4 cm soft tissue nodule, suspicious for left internal iliac nodal metastasis. 3. New left vaginal cuff 1.6 x 1.3 cm soft tissue nodule, suspicious for recurrent tumor. 4. Aortic Atherosclerosis (ICD10-I70.0).   05/18/2020 Procedure   Successful placement of a right internal jugular approach power injectable Port-A-Cath. The catheter is ready for immediate use.   05/23/2020 -  Chemotherapy   The patient had carboplatin and taxol for chemotherapy treatment.     07/28/2020 Imaging   1. No findings identified to suggest recurrent or metastatic disease. 2. Indeterminate and slightly exophytic lesion arising from the inferior pole of left kidney measures 0.8 cm. Technically this is too small to reliably characterize. Attention on  follow-up imaging is advised. 3. Aortic atherosclerosis and LAD coronary artery atherosclerotic calcifications.   11/07/2020 Imaging   1. Interval progression of right pelvic sidewall lymphadenopathy, highly concerning for disease progression. PET-CT may prove helpful to further evaluate as clinically warranted. 2. Interval resolution of the previously identified hypodense lesion in segment IVA of the liver. 3. Stable 7 mm subcapsular lesion in the lower pole left kidney with attenuation higher than would be expected for a simple cyst but is too small to reliably characterize. Attention on follow-up recommended. 4. Aortic Atherosclerosis (ICD10-I70.0).   11/16/2020 -  Chemotherapy   The patient had lenvatinib 20 mg daily dose (LENVIMA) 2 x 10 MG capsule, 10 mg (100 % of original dose 10 mg), Oral, Daily, 1 of 1 cycle, Start date: 11/08/2020, End date: 11/08/2020 Dose  modification: 10 mg (original dose 10 mg, Cycle 0) pembrolizumab (KEYTRUDA) 200 mg in sodium chloride 0.9 % 50 mL chemo infusion, 200 mg, Intravenous, Once, 1 of 6 cycles  for chemotherapy treatment.      REVIEW OF SYSTEMS:   Constitutional: Denies fevers, chills or abnormal weight loss Eyes: Denies blurriness of vision Ears, nose, mouth, throat, and face: Denies mucositis or sore throat Respiratory: Denies cough, dyspnea or wheezes Cardiovascular: Denies palpitation, chest discomfort or lower extremity swelling Gastrointestinal:  Denies nausea, heartburn or change in bowel habits Skin: Denies abnormal skin rashes Lymphatics: Denies new lymphadenopathy or easy bruising Neurological:Denies numbness, tingling or new weaknesses Behavioral/Psych: Mood is stable, no new changes  All other systems were reviewed with the patient and are negative.  I have reviewed the past medical history, past surgical history, social history and family history with the patient and they are unchanged from previous note.  ALLERGIES:  is allergic to benicar hct [olmesartan medoxomil-hctz], bactrim [sulfamethoxazole-trimethoprim], pravastatin, shellfish allergy, amlodipine, and iodinated diagnostic agents.  MEDICATIONS:  Current Outpatient Medications  Medication Sig Dispense Refill   acetaminophen (TYLENOL) 500 MG tablet Take 1,000 mg by mouth every 6 (six) hours as needed for moderate pain.     APPLE CIDER VINEGAR PO Take 5 mLs by mouth daily.      b complex vitamins tablet Take 1 tablet by mouth daily.     Blood Pressure Monitoring (BLOOD PRESSURE KIT) DEVI 1 Units by Does not apply route every morning. 1 each 0   calcium-vitamin D (OSCAL WITH D) 500-200 MG-UNIT tablet Take 1 tablet by mouth daily with breakfast.      diphenhydrAMINE (BENADRYL) 25 MG tablet Take 25 mg by mouth every 6 (six) hours as needed for allergies.     famotidine (PEPCID) 20 MG tablet Take 20 mg by mouth 2 (two) times daily as  needed for heartburn or indigestion.     lenvatinib 10 mg daily dose (LENVIMA, 10 MG DAILY DOSE,) capsule Take 1 capsule (10 mg total) by mouth daily. 30 capsule 11   loratadine (CLARITIN) 10 MG tablet Take 10 mg by mouth daily as needed for allergies.     Multiple Vitamin (MULTIVITAMIN) capsule Take 1 capsule by mouth daily.     ondansetron (ZOFRAN) 8 MG tablet Take 1 tablet (8 mg total) by mouth every 8 (eight) hours as needed. Start on the third day after chemotherapy. 30 tablet 1   oxyCODONE (OXY IR/ROXICODONE) 5 MG immediate release tablet Take 1 tablet (5 mg total) by mouth every 4 (four) hours as needed for severe pain. 30 tablet 0   polyethylene glycol (MIRALAX / GLYCOLAX) 17 g packet Take 17 g  by mouth daily as needed for mild constipation.     prochlorperazine (COMPAZINE) 10 MG tablet Take 1 tablet (10 mg total) by mouth every 6 (six) hours as needed (Nausea or vomiting). 30 tablet 1   Vitamin D, Ergocalciferol, (DRISDOL) 1.25 MG (50000 UNIT) CAPS capsule Take 1 capsule (50,000 Units total) by mouth every 7 (seven) days for 12 doses. 12 capsule 0   No current facility-administered medications for this visit.   Facility-Administered Medications Ordered in Other Visits  Medication Dose Route Frequency Provider Last Rate Last Admin   sodium chloride flush (NS) 0.9 % injection 10 mL  10 mL Intracatheter PRN [name], MD   10 mL at 11/16/20 1438    PHYSICAL EXAMINATION: ECOG PERFORMANCE STATUS: 1 - Symptomatic but completely ambulatory  Vitals:   11/16/20 1156  BP: (!) 152/83  Pulse: 81  Resp: 18  Temp: (!) 97.5 F (36.4 C)  SpO2: 100%   Filed Weights   11/16/20 1156  Weight: 206 lb 9.6 oz (93.7 kg)    GENERAL:alert, no distress and comfortable  NEURO: alert & oriented x 3 with fluent speech, no focal motor/sensory deficits  LABORATORY DATA:  I have reviewed the data as listed    Component Value Date/Time   NA 137 11/16/2020 1149   NA 140 07/04/2017 0952    K 3.9 11/16/2020 1149   CL 103 11/16/2020 1149   CO2 26 11/16/2020 1149   GLUCOSE 103 (H) 11/16/2020 1149   BUN 12 11/16/2020 1149   BUN 10 07/04/2017 0952   CREATININE 0.82 11/16/2020 1149   CREATININE 0.81 03/23/2015 0946   CALCIUM 9.2 11/16/2020 1149   PROT 7.2 11/16/2020 1149   ALBUMIN 3.5 11/16/2020 1149   AST 41 11/16/2020 1149   ALT <6 11/16/2020 1149   ALKPHOS 87 11/16/2020 1149   BILITOT 0.3 11/16/2020 1149   GFRNONAA >60 11/16/2020 1149   GFRNONAA 78 03/23/2015 0946   GFRAA >60 09/12/2020 0853   GFRAA >89 03/23/2015 0946    No results found for: SPEP, UPEP  Lab Results  Component Value Date   WBC 4.5 11/16/2020   NEUTROABS 2.6 11/16/2020   HGB 10.8 (L) 11/16/2020   HCT 33.2 (L) 11/16/2020   MCV 75.8 (L) 11/16/2020   PLT 234 11/16/2020      Chemistry      Component Value Date/Time   NA 137 11/16/2020 1149   NA 140 07/04/2017 0952   K 3.9 11/16/2020 1149   CL 103 11/16/2020 1149   CO2 26 11/16/2020 1149   BUN 12 11/16/2020 1149   BUN 10 07/04/2017 0952   CREATININE 0.82 11/16/2020 1149   CREATININE 0.81 03/23/2015 0946      Component Value Date/Time   CALCIUM 9.2 11/16/2020 1149   ALKPHOS 87 11/16/2020 1149   AST 41 11/16/2020 1149   ALT <6 11/16/2020 1149   BILITOT 0.3 11/16/2020 1149

## 2020-11-16 NOTE — Patient Instructions (Signed)
[facility] Discharge Instructions for Patients Receiving Chemotherapy  Today you received the following chemotherapy agents Pembrolizumab [facility]).  To help prevent nausea and vomiting after your treatment, we encourage you to take your nausea medication as prescribed.   If you develop nausea and vomiting that is not controlled by your nausea medication, call the clinic.   BELOW ARE SYMPTOMS THAT SHOULD BE REPORTED IMMEDIATELY:  *FEVER GREATER THAN 100.5 F  *CHILLS WITH OR WITHOUT FEVER  NAUSEA AND VOMITING THAT IS NOT CONTROLLED WITH YOUR NAUSEA MEDICATION  *UNUSUAL SHORTNESS OF BREATH  *UNUSUAL BRUISING OR BLEEDING  TENDERNESS IN MOUTH AND THROAT WITH OR WITHOUT PRESENCE OF ULCERS  *URINARY PROBLEMS  *BOWEL PROBLEMS  UNUSUAL RASH Items with * indicate a potential emergency and should be followed up as soon as possible.  Feel free to call the clinic should you have any questions or concerns. The clinic phone number is [phone].  Please show the [place] at check-in to the Emergency Department and triage nurse.  Pembrolizumab injection What is this medicine? PEMBROLIZUMAB (pem broe liz ue mab) is a monoclonal antibody. It is used to treat certain types of cancer. This medicine may be used for other purposes; ask your health care provider or pharmacist if you have questions. COMMON BRAND NAME(S): Keytruda What should I tell my health care provider before I take this medicine? They need to know if you have any of these conditions:  diabetes  immune system problems  inflammatory bowel disease  liver disease  lung or breathing disease  lupus  received or scheduled to receive an organ transplant or a stem-cell transplant that uses donor stem cells  an unusual or allergic reaction to pembrolizumab, other medicines, foods, dyes, or preservatives  pregnant or trying to get pregnant  breast-feeding How should I use this  medicine? This medicine is for infusion into a vein. It is given by a health care professional in a hospital or clinic setting. A special MedGuide will be given to you before each treatment. Be sure to read this information carefully each time. Talk to your pediatrician regarding the use of this medicine in children. While this drug may be prescribed for children as young as 6 months for selected conditions, precautions do apply. Overdosage: If you think you have taken too much of this medicine contact a poison control center or emergency room at once. NOTE: This medicine is only for you. Do not share this medicine with others. What if I miss a dose? It is important not to miss your dose. Call your doctor or health care professional if you are unable to keep an appointment. What may interact with this medicine? Interactions have not been studied. Give your health care provider a list of all the medicines, herbs, non-prescription drugs, or dietary supplements you use. Also tell them if you smoke, drink alcohol, or use illegal drugs. Some items may interact with your medicine. This list may not describe all possible interactions. Give your health care provider a list of all the medicines, herbs, non-prescription drugs, or dietary supplements you use. Also tell them if you smoke, drink alcohol, or use illegal drugs. Some items may interact with your medicine. What should I watch for while using this medicine? Your condition will be monitored carefully while you are receiving this medicine. You may need blood work done while you are taking this medicine. Do not become pregnant while taking this medicine or for 4 months after stopping it. Women  should inform their doctor if they wish to become pregnant or think they might be pregnant. There is a potential for serious side effects to an unborn child. Talk to your health care professional or pharmacist for more information. Do not breast-feed an infant while  taking this medicine or for 4 months after the last dose. What side effects may I notice from receiving this medicine? Side effects that you should report to your doctor or health care professional as soon as possible:  allergic reactions like skin rash, itching or hives, swelling of the face, lips, or tongue  bloody or black, tarry  breathing problems  changes in vision  chest pain  chills  confusion  constipation  cough  diarrhea  dizziness or feeling faint or lightheaded  fast or irregular heartbeat  fever  flushing  joint pain  low blood counts - this medicine may decrease the number of white blood cells, red blood cells and platelets. You may be at increased risk for infections and bleeding.  muscle pain  muscle weakness  pain, tingling, numbness in the hands or feet  persistent headache  redness, blistering, peeling or loosening of the skin, including inside the mouth  signs and symptoms of high blood sugar such as dizziness; dry mouth; dry skin; fruity breath; nausea; stomach pain; increased hunger or thirst; increased urination  signs and symptoms of kidney injury like trouble passing urine or change in the amount of urine  signs and symptoms of liver injury like dark urine, light-colored stools, loss of appetite, nausea, right upper belly pain, yellowing of the eyes or skin  sweating  swollen lymph nodes  weight loss Side effects that usually do not require medical attention (report to your doctor or health care professional if they continue or are bothersome):  decreased appetite  hair loss  muscle pain  tiredness This list may not describe all possible side effects. Call your doctor for medical advice about side effects. You may report side effects to FDA at 1-800-FDA-1088. Where should I keep my medicine? This drug is given in a hospital or clinic and will not be stored at home. NOTE: This sheet is a summary. It may not cover all  possible information. If you have questions about this medicine, talk to your doctor, pharmacist, or health care provider.  2020 Elsevier/Gold Standard (2019-10-23 18:07:58)

## 2020-11-16 NOTE — Assessment & Plan Note (Signed)
She has chronic stable anemia Observe for now

## 2020-11-16 NOTE — Assessment & Plan Note (Signed)
We discussed the risk and benefits of taking narcotic prescription for pain She is somewhat reluctant She will continue acetaminophen during daytime but I recommend oxycodone for severe pain

## 2020-11-17 LAB — T4: T4, Total: 7.2 ug/dL (ref 4.5–12.0)

## 2020-11-21 ENCOUNTER — Emergency Department (HOSPITAL_COMMUNITY): Payer: Medicare Other

## 2020-11-21 ENCOUNTER — Other Ambulatory Visit: Payer: Self-pay

## 2020-11-21 ENCOUNTER — Emergency Department (HOSPITAL_COMMUNITY)
Admission: EM | Admit: 2020-11-21 | Discharge: 2020-11-22 | Disposition: A | Discharge status: Home / Self Care | Payer: Medicare Other | Attending: Emergency Medicine | Admitting: Emergency Medicine

## 2020-11-21 ENCOUNTER — Encounter (HOSPITAL_COMMUNITY): Payer: Self-pay | Admitting: Emergency Medicine

## 2020-11-21 DIAGNOSIS — Z79899 Other long term (current) drug therapy: Secondary | ICD-10-CM | POA: Diagnosis not present

## 2020-11-21 DIAGNOSIS — Z8542 Personal history of malignant neoplasm of other parts of uterus: Secondary | ICD-10-CM | POA: Insufficient documentation

## 2020-11-21 DIAGNOSIS — I10 Essential (primary) hypertension: Secondary | ICD-10-CM | POA: Diagnosis not present

## 2020-11-21 DIAGNOSIS — R0602 Shortness of breath: Secondary | ICD-10-CM | POA: Diagnosis not present

## 2020-11-21 DIAGNOSIS — J9811 Atelectasis: Secondary | ICD-10-CM | POA: Diagnosis not present

## 2020-11-21 DIAGNOSIS — R079 Chest pain, unspecified: Secondary | ICD-10-CM | POA: Diagnosis not present

## 2020-11-21 DIAGNOSIS — R030 Elevated blood-pressure reading, without diagnosis of hypertension: Secondary | ICD-10-CM | POA: Diagnosis present

## 2020-11-21 LAB — BASIC METABOLIC PANEL
Anion gap: 9 (ref 5–15)
BUN: 9 mg/dL (ref 8–23)
CO2: 23 mmol/L (ref 22–32)
Calcium: 8.7 mg/dL — ABNORMAL LOW (ref 8.9–10.3)
Chloride: 104 mmol/L (ref 98–111)
Creatinine, Ser: 0.62 mg/dL (ref 0.44–1.00)
GFR, Estimated: >60 mL/min (ref >60)
Glucose, Bld: 108 mg/dL — ABNORMAL HIGH (ref 70–99)
Potassium: 3.7 mmol/L (ref 3.5–5.1)
Sodium: 136 mmol/L (ref 135–145)

## 2020-11-21 LAB — CBC
HCT: 33.1 % — ABNORMAL LOW (ref 36.0–46.0)
Hemoglobin: 10.6 g/dL — ABNORMAL LOW (ref 12.0–15.0)
MCH: 24.8 pg — ABNORMAL LOW (ref 26.0–34.0)
MCHC: 32 g/dL (ref 30.0–36.0)
MCV: 77.5 fL — ABNORMAL LOW (ref 80.0–100.0)
Platelets: 208 10*3/uL (ref 150–400)
RBC: 4.27 MIL/uL (ref 3.87–5.11)
RDW: 16.2 % — ABNORMAL HIGH (ref 11.5–15.5)
WBC: 4.5 10*3/uL (ref 4.0–10.5)
nRBC: 0 % (ref 0.0–0.2)

## 2020-11-21 LAB — CBG MONITORING, ED: Glucose-Capillary: 104 mg/dL — ABNORMAL HIGH (ref 70–99)

## 2020-11-21 LAB — TROPONIN I (HIGH SENSITIVITY): Troponin I (High Sensitivity): <2 ng/L (ref <18)

## 2020-11-21 MED ORDER — METOPROLOL TARTRATE 25 MG PO TABS
25.0000 mg | ORAL_TABLET | Freq: Once | ORAL | Status: AC
  Administered 2020-11-21: 25 mg via ORAL
  Filled 2020-11-21: qty 1

## 2020-11-21 MED ORDER — ASPIRIN 81 MG PO CHEW
324.0000 mg | CHEWABLE_TABLET | Freq: Once | ORAL | Status: AC
  Administered 2020-11-21: 324 mg via ORAL
  Filled 2020-11-21: qty 4

## 2020-11-21 NOTE — ED Triage Notes (Signed)
Pt arriving after having reaction to chemo medication. Pt was instructed by the on call nurse to report to the ER due to increased BP and shortness of breath. Shortness of breath has resolved since arriving.

## 2020-11-21 NOTE — ED Provider Notes (Signed)
[facility] DEPT Provider Note   CSN: [id number] Arrival date & time: 11/21/20  2110     History Chief Complaint  Patient presents with  . Medication Reaction    Tina Stone is a 70 y.o. female.  HPI   Patient presented to the emergency room because she started experiencing shortness of breath and elevated blood pressure this evening.  Patient is currently receiving chemotherapy for endometrial cancer.  Patient's last infusion was on November 17.  She was treated with Keytruda.  Patient continues on oral Lenvima.  Patient states this evening she started to feel short of breath she noticed her blood pressure was also elevated greater than 160/110.  Patient's called the on-call oncology nurse and was instructed to come to the ED.  Patient states her symptoms have improved since then.  She is no longer feeling short of breath.  She is not having any chest pain.  No fevers or chills.  She denies any leg swelling.  She has not had trouble with her prior treatments  Past Medical History:  Diagnosis Date  . Adenomatous colon polyp   . Allergic rhinitis, seasonal   . Allergy   . Beta thalassemia trait 11/25/2013  . Cholelithiasis   . Class 3 obesity without serious comorbidity with body mass index (BMI) of 40.0 to 44.9 in adult 11/19/2012  . endometrial ca dx'd 08/2009   endometrial   . GERD (gastroesophageal reflux disease)   . HLD (hyperlipidemia)   . Hypercholesterolemia   . Hypertension 03/18/2017   no meds   . Intraductal papilloma of left breast    Patient underwent left needle-localized lumpectomy by Dr. Imogene Burn. Tsuei on 09/09/2013; pathology showed intraductal papilloma with no atypia or malignancy identified.  Marland Kitchen PONV (postoperative nausea and vomiting)   . Pre-diabetes    pt denies  . Uterine fibroid     Patient Active Problem List   Diagnosis Date Noted  . Elevated blood pressure reading in office without diagnosis of hypertension  11/16/2020  . Cancer associated pain 11/08/2020  . Vitamin D deficiency 10/27/2020  . Pancytopenia, acquired ([place]) 09/12/2020  . Skin rash 08/17/2020  . Metastasis to liver ([place]) 06/14/2020  . Bone pain 05/27/2020  . Goals of care, counseling/discussion 05/16/2020  . Endometrial cancer ([place]) 10/22/2019  . Postmenopausal bleeding 09/02/2019  . Biliary colic 94/17/4081  . Breast pain, left 05/20/2019  . Trapezius muscle spasm 05/11/2019  . Elevated random blood glucose level 02/13/2019  . Epigastric pain 01/29/2019  . Prediabetes 03/18/2017  . Hypertension 03/18/2017  . Beta thalassemia trait 11/25/2013  . Class II obesity 11/19/2012  . Women's annual routine gynecological examination 07/12/2011  . Gastroesophageal reflux disease 01/28/2007  . Hyperlipidemia 10/10/2006    Past Surgical History:  Procedure Laterality Date  . [place]   right  . BREAST EXCISIONAL BIOPSY Left 2014   benign  . BREAST LUMPECTOMY WITH NEEDLE LOCALIZATION Left 09/09/2013   Procedure: BREAST LUMPECTOMY WITH NEEDLE LOCALIZATION;  Surgeon: Imogene Burn. Georgette Dover, MD;  Location: [facility];  Service: General;  Laterality: Left;  . CHOLECYSTECTOMY    . COLONOSCOPY    . IR IMAGING GUIDED PORT INSERTION  05/18/2020  . POLYPECTOMY    . ROBOTIC ASSISTED LAPAROSCOPIC CHOLECYSTECTOMY  09/09/2019  . ROBOTIC ASSISTED TOTAL HYSTERECTOMY WITH BILATERAL SALPINGO OOPHERECTOMY N/A 10/22/2019   Procedure: XI ROBOTIC ASSISTED TOTAL HYSTERECTOMY WITH BILATERAL SALPINGO OOPHORECTOMY GREATER THAN 250 GRAMS, MINI LAPARTOMY FOR SPECIMEN DELIVERY; PELVIC AND PERI-AORTIC  LYMPHADENECTOMY;  Surgeon: Everitt Amber, MD;  Location: [facility];  Service: Gynecology;  Laterality: N/A;  . SENTINEL NODE BIOPSY N/A 10/22/2019   Procedure: SENTINEL NODE BIOPSY;  Surgeon: Everitt Amber, MD;  Location: [facility];  Service: Gynecology;  Laterality: N/A;     OB History    Gravida  1   Para  0   Term  0   Preterm  0   AB  1   Living    0     SAB  0   TAB  1   Ectopic  0   Multiple  0   Live Births  0           Family History  Problem Relation Age of Onset  . Diabetes Mother   . Hypertension Mother   . Colon polyps Mother 26  . Dementia Mother 24  . Diabetes Father   . Congestive Heart Failure Father   . Pancreatic cancer Paternal Aunt   . Colon cancer Neg Hx   . Breast cancer Neg Hx   . Lung cancer Neg Hx   . Esophageal cancer Neg Hx   . Rectal cancer Neg Hx   . Stomach cancer Neg Hx     Social History   Tobacco Use  . Smoking status: Never Smoker  . Smokeless tobacco: Never Used  . Tobacco comment: few puffs but not a true smoker quit many yrs ago  Vaping Use  . Vaping Use: Never used  Substance Use Topics  . Alcohol use: Never  . Drug use: Never    Home Medications Prior to Admission medications   Medication Sig Start Date End Date Taking? Authorizing Provider  acetaminophen (TYLENOL) 500 MG tablet Take 1,000 mg by mouth every 6 (six) hours as needed for moderate pain.    [provider]  APPLE CIDER VINEGAR PO Take 5 mLs by mouth daily.     [provider]  b complex vitamins tablet Take 1 tablet by mouth daily.    [provider]  Blood Pressure Monitoring (BLOOD PRESSURE KIT) DEVI 1 Units by Does not apply route every morning. 07/01/20   Raylene Everts, MD  calcium-vitamin D (OSCAL WITH D) 500-200 MG-UNIT tablet Take 1 tablet by mouth daily with breakfast.     [provider]  chlorthalidone (HYGROTON) 25 MG tablet Take 1 tablet (25 mg total) by mouth daily. 11/22/20   Dorie Rank, MD  diphenhydrAMINE (BENADRYL) 25 MG tablet Take 25 mg by mouth every 6 (six) hours as needed for allergies.    [provider]  famotidine (PEPCID) 20 MG tablet Take 20 mg by mouth 2 (two) times daily as needed for heartburn or indigestion.    [provider]  lenvatinib 10 mg daily dose (LENVIMA, 10 MG DAILY DOSE,) capsule Take 1 capsule (10 mg  total) by mouth daily. 11/08/20   [name], MD  loratadine (CLARITIN) 10 MG tablet Take 10 mg by mouth daily as needed for allergies.    [provider]  Multiple Vitamin (MULTIVITAMIN) capsule Take 1 capsule by mouth daily.    [provider]  ondansetron (ZOFRAN) 8 MG tablet Take 1 tablet (8 mg total) by mouth every 8 (eight) hours as needed. Start on the third day after chemotherapy. 05/16/20   [name], MD  oxyCODONE (OXY IR/ROXICODONE) 5 MG immediate release tablet Take 1 tablet (5 mg total) by mouth every 4 (four) hours as needed for severe pain. 11/08/20  [name], MD  polyethylene glycol (MIRALAX / GLYCOLAX) 17 g packet Take 17 g by mouth daily as needed for mild constipation.    [provider]  prochlorperazine (COMPAZINE) 10 MG tablet Take 1 tablet (10 mg total) by mouth every 6 (six) hours as needed (Nausea or vomiting). 05/16/20   [name], MD  Vitamin D, Ergocalciferol, (DRISDOL) 1.25 MG (50000 UNIT) CAPS capsule Take 1 capsule (50,000 Units total) by mouth every 7 (seven) days for 12 doses. 10/27/20 01/13/21  Isaac Bliss, Rayford Halsted, MD  atorvastatin (LIPITOR) 20 MG tablet Take 1 tablet (20 mg total) by mouth daily. 05/03/20 07/01/20  Erline Hau, MD    Allergies    Benicar hct [olmesartan medoxomil-hctz], Bactrim [sulfamethoxazole-trimethoprim], Pravastatin, Shellfish allergy, Amlodipine, and Iodinated diagnostic agents  Review of Systems   Review of Systems  All other systems reviewed and are negative.   Physical Exam Updated Vital Signs BP (!) 172/99   Pulse 82   Temp (!) 97.5 F (36.4 C) (Oral)   Resp 19   Ht 1.626 m (_0 )   Wt 93.4 kg   SpO2 100%   BMI 35.36 kg/m   Physical Exam Vitals and nursing note reviewed.  Constitutional:      General: She is not in acute distress.    Appearance: She is well-developed.  HENT:     Head: Normocephalic and atraumatic.     Right Ear: External ear normal.     Left Ear:  External ear normal.  Eyes:     General: No scleral icterus.       Right eye: No discharge.        Left eye: No discharge.     Conjunctiva/sclera: Conjunctivae normal.  Neck:     Trachea: No tracheal deviation.  Cardiovascular:     Rate and Rhythm: Normal rate and regular rhythm.  Pulmonary:     Effort: Pulmonary effort is normal. No respiratory distress.     Breath sounds: Normal breath sounds. No stridor. No wheezing or rales.  Abdominal:     General: Bowel sounds are normal. There is no distension.     Palpations: Abdomen is soft.     Tenderness: There is no abdominal tenderness. There is no guarding or rebound.  Musculoskeletal:        General: No tenderness.     Cervical back: Neck supple.  Skin:    General: Skin is warm and dry.     Findings: No rash.  Neurological:     Mental Status: She is alert.     Cranial Nerves: No cranial nerve deficit (no facial droop, extraocular movements intact, no slurred speech).     Sensory: No sensory deficit.     Motor: No abnormal muscle tone or seizure activity.     Coordination: Coordination normal.     ED Results / Procedures / Treatments   Labs (all labs ordered are listed, but only abnormal results are displayed) Labs Reviewed  BASIC METABOLIC PANEL - Abnormal; Notable for the following components:      Result Value   Glucose, Bld 108 (*)    Calcium 8.7 (*)    All other components within normal limits  CBC - Abnormal; Notable for the following components:   Hemoglobin 10.6 (*)    HCT 33.1 (*)    MCV 77.5 (*)    MCH 24.8 (*)    RDW 16.2 (*)    All other components within normal limits  CBG MONITORING,  ED - Abnormal; Notable for the following components:   Glucose-Capillary 104 (*)    All other components within normal limits  TROPONIN I (HIGH SENSITIVITY)  TROPONIN I (HIGH SENSITIVITY)    EKG EKG Interpretation  Date/Time:  Monday November 21 2020 22:11:50 EST Ventricular Rate:  78 PR Interval:    QRS  Duration: 80 QT Interval:  375 QTC Calculation: 428 R Axis:   42 Text Interpretation: Sinus rhythm No significant change since last tracing Confirmed by Dorie Rank [phone]) on 11/21/2020 11:41:45 PM   Radiology DG Chest Portable 1 View  Result Date: 11/21/2020 CLINICAL DATA:  Chest pain EXAM: PORTABLE CHEST 1 VIEW COMPARISON:  07/09/2020 FINDINGS: Right-sided central venous port tip over the right atrium. Patchy opacity at the right infrahilar lung, possible atelectasis. Stable cardiomediastinal silhouette with aortic atherosclerosis. No pneumothorax. IMPRESSION: Probable atelectasis at the right infrahilar lung. Electronically Signed   By: Donavan Foil M.D.   On: 11/21/2020 23:21    Procedures Procedures (including critical care time)  Medications Ordered in ED Medications  aspirin chewable tablet 324 mg (324 mg Oral Given 11/21/20 2257)  metoprolol tartrate (LOPRESSOR) tablet 25 mg (25 mg Oral Given 11/21/20 2348)    ED Course  I have reviewed the triage vital signs and the nursing notes.  Pertinent labs & imaging results that were available during my care of the patient were reviewed by me and considered in my medical decision making (see chart for details).  Clinical Course as of Nov 22 21  Mon Nov 21, 2020  2341 Troponin normal   [JK]  2341 CBC normal.  Metabolic panel normal   [JK]  2341 Chest x-ray shows atelectasis   [JK]    Clinical Course User Index [JK] Dorie Rank, MD   MDM Rules/Calculators/A&P                          Patient's ED work-up is reassuring.  No signs of allergic reaction.  Patient is breathing easily and is not having any dyspnea.  I doubt pulmonary embolism.  Patient is having elevated blood pressure.  She does have a history of hypertension in the past but has not been on medications in a while.  No signs of acute kidney injury.  No signs of acute coronary syndrome.  Plan on checking delta troponin.  Patient had been on chlorthalidone in the  past and I will get her restarted on that medication. Final Clinical Impression(s) / ED Diagnoses Final diagnoses:  Hypertension, unspecified type    Rx / DC Orders ED Discharge Orders         Ordered    chlorthalidone (HYGROTON) 25 MG tablet  Daily        11/22/20 0020           Dorie Rank, MD 11/22/20 [phone]

## 2020-11-22 ENCOUNTER — Telehealth: Payer: Self-pay

## 2020-11-22 LAB — TROPONIN I (HIGH SENSITIVITY): Troponin I (High Sensitivity): 4 ng/L (ref <18)

## 2020-11-22 MED ORDER — CHLORTHALIDONE 25 MG PO TABS: 25.0000 mg | ORAL_TABLET | Freq: Every day | ORAL | 0 refills | Status: DC

## 2020-11-22 MED ORDER — HEPARIN SOD (PORK) LOCK FLUSH 100 UNIT/ML IV SOLN
500.0000 [IU] | Freq: Once | INTRAVENOUS | Status: AC
  Administered 2020-11-22: 500 [IU]
  Filled 2020-11-22: qty 5

## 2020-11-22 NOTE — Telephone Encounter (Signed)
She showed up and sitting in the lobby with no appointment. Her blood pressure went up last night and she went to the ER. She was feeling shot of breath and had a nose bleed prior to ER visit. Dr. Alvy Bimler made aware and read the ER note. Instructed to go home and rest. Start blood pressure medication and check bp BID. The office will call Friday to get bp readings. Ask her to call the office back if needed. She verbalized understanding.

## 2020-11-22 NOTE — Discharge Instructions (Signed)
Start taking the hypertension medication.  Follow up with your primary care doctor to follow up on your blood pressure

## 2020-11-22 NOTE — ED Provider Notes (Signed)
I assumed care of this patient.  Please see previous provider note for further details of Hx, PE.  Briefly patient is a 70 y.o. female who is pending delta trop. Plan for DC if negative.  Trop negative.  The patient appears reasonably screened and/or stabilized for discharge and I doubt any other medical condition or other [facility] requiring further screening, evaluation, or treatment in the ED at this time prior to discharge. Safe for discharge with strict return precautions.  Disposition: Discharge  Condition: Good  I have discussed the results, Dx and Tx plan with the patient/family who expressed understanding and agree(s) with the plan. Discharge instructions discussed at length. The patient/family was given strict return precautions who verbalized understanding of the instructions. No further questions at time of discharge.    ED Discharge Orders         Ordered    chlorthalidone (HYGROTON) 25 MG tablet  Daily        11/22/20 0020            Follow Up: Isaac Bliss, Rayford Halsted, MD [place] [address] [phone]  Call  To schedule an appointment for close follow up         Midori Dado, Grayce Sessions, MD 11/22/20 [phone]

## 2020-11-25 ENCOUNTER — Emergency Department (HOSPITAL_COMMUNITY)
Admission: EM | Admit: 2020-11-25 | Discharge: 2020-11-25 | Disposition: A | Discharge status: Home / Self Care | Payer: Medicare Other | Attending: Emergency Medicine | Admitting: Emergency Medicine

## 2020-11-25 ENCOUNTER — Other Ambulatory Visit: Payer: Self-pay

## 2020-11-25 ENCOUNTER — Encounter (HOSPITAL_COMMUNITY): Payer: Self-pay

## 2020-11-25 ENCOUNTER — Emergency Department (HOSPITAL_COMMUNITY): Payer: Medicare Other

## 2020-11-25 ENCOUNTER — Telehealth: Payer: Self-pay | Admitting: *Deleted

## 2020-11-25 DIAGNOSIS — I700 Atherosclerosis of aorta: Secondary | ICD-10-CM | POA: Diagnosis not present

## 2020-11-25 DIAGNOSIS — Z8542 Personal history of malignant neoplasm of other parts of uterus: Secondary | ICD-10-CM | POA: Insufficient documentation

## 2020-11-25 DIAGNOSIS — R42 Dizziness and giddiness: Secondary | ICD-10-CM

## 2020-11-25 DIAGNOSIS — I10 Essential (primary) hypertension: Secondary | ICD-10-CM | POA: Diagnosis not present

## 2020-11-25 DIAGNOSIS — R0602 Shortness of breath: Secondary | ICD-10-CM | POA: Diagnosis not present

## 2020-11-25 LAB — CBC WITH DIFFERENTIAL/PLATELET
Abs Immature Granulocytes: 0.02 10*3/uL (ref 0.00–0.07)
Basophils Absolute: 0 10*3/uL (ref 0.0–0.1)
Basophils Relative: 0 %
Eosinophils Absolute: 0.1 10*3/uL (ref 0.0–0.5)
Eosinophils Relative: 1 %
HCT: 36.8 % (ref 36.0–46.0)
Hemoglobin: 11.8 g/dL — ABNORMAL LOW (ref 12.0–15.0)
Immature Granulocytes: 0 %
Lymphocytes Relative: 24 %
Lymphs Abs: 1.3 10*3/uL (ref 0.7–4.0)
MCH: 24.6 pg — ABNORMAL LOW (ref 26.0–34.0)
MCHC: 32.1 g/dL (ref 30.0–36.0)
MCV: 76.8 fL — ABNORMAL LOW (ref 80.0–100.0)
Monocytes Absolute: 0.4 10*3/uL (ref 0.1–1.0)
Monocytes Relative: 8 %
Neutro Abs: 3.5 10*3/uL (ref 1.7–7.7)
Neutrophils Relative %: 67 %
Platelets: 255 10*3/uL (ref 150–400)
RBC: 4.79 MIL/uL (ref 3.87–5.11)
RDW: 15.9 % — ABNORMAL HIGH (ref 11.5–15.5)
WBC: 5.3 10*3/uL (ref 4.0–10.5)
nRBC: 0 % (ref 0.0–0.2)

## 2020-11-25 LAB — BASIC METABOLIC PANEL
Anion gap: 11 (ref 5–15)
BUN: 17 mg/dL (ref 8–23)
CO2: 26 mmol/L (ref 22–32)
Calcium: 9.5 mg/dL (ref 8.9–10.3)
Chloride: 99 mmol/L (ref 98–111)
Creatinine, Ser: 0.81 mg/dL (ref 0.44–1.00)
GFR, Estimated: >60 mL/min (ref >60)
Glucose, Bld: 121 mg/dL — ABNORMAL HIGH (ref 70–99)
Potassium: 3.5 mmol/L (ref 3.5–5.1)
Sodium: 136 mmol/L (ref 135–145)

## 2020-11-25 MED ORDER — HEPARIN SOD (PORK) LOCK FLUSH 100 UNIT/ML IV SOLN
500.0000 [IU] | Freq: Once | INTRAVENOUS | Status: AC
  Administered 2020-11-25: 500 [IU]
  Filled 2020-11-25: qty 5

## 2020-11-25 NOTE — ED Triage Notes (Addendum)
Patient reports she began having SOB and feeling like "I was going to pass out." Patient states her BP-at home was 176/116 HR-101.  BP in triage-140/97   During the middle of triage patient states, :I made a mistake, I was just dizzy. I did not mean to say that I was about to pass out."

## 2020-11-25 NOTE — Discharge Instructions (Signed)
Avoid foods with high sodium.

## 2020-11-25 NOTE — Telephone Encounter (Signed)
Thanks

## 2020-11-25 NOTE — ED Provider Notes (Signed)
[facility] DEPT Provider Note   CSN: [id number] Arrival date & time: 11/25/20  1647     History Chief Complaint  Patient presents with  . Hypertension  . Shortness of Breath  . Dizziness    Tina Stone is a 70 y.o. female.  The history is provided by the patient. No language interpreter was used.  Hypertension This is a recurrent problem. The current episode started less than 1 hour ago. The problem has been gradually worsening. Associated symptoms include shortness of breath. Nothing aggravates the symptoms. Nothing relieves the symptoms. She has tried nothing for the symptoms. The treatment provided no relief.  Shortness of Breath Dizziness Associated symptoms: shortness of breath    Pt reports she felt dizzy today and checked her blood pressure.  Pt reports her blood pressure was elevated     Past Medical History:  Diagnosis Date  . Adenomatous colon polyp   . Allergic rhinitis, seasonal   . Allergy   . Beta thalassemia trait 11/25/2013  . Cholelithiasis   . Class 3 obesity without serious comorbidity with body mass index (BMI) of 40.0 to 44.9 in adult 11/19/2012  . endometrial ca dx'd 08/2009   endometrial   . GERD (gastroesophageal reflux disease)   . HLD (hyperlipidemia)   . Hypercholesterolemia   . Hypertension 03/18/2017   no meds   . Intraductal papilloma of left breast    Patient underwent left needle-localized lumpectomy by Dr. Imogene Burn. Tsuei on 09/09/2013; pathology showed intraductal papilloma with no atypia or malignancy identified.  Marland Kitchen PONV (postoperative nausea and vomiting)   . Pre-diabetes    pt denies  . Uterine fibroid     Patient Active Problem List   Diagnosis Date Noted  . Elevated blood pressure reading in office without diagnosis of hypertension 11/16/2020  . Cancer associated pain 11/08/2020  . Vitamin D deficiency 10/27/2020  . Pancytopenia, acquired ([place]) 09/12/2020  . Skin rash 08/17/2020  .  Metastasis to liver ([place]) 06/14/2020  . Bone pain 05/27/2020  . Goals of care, counseling/discussion 05/16/2020  . Endometrial cancer ([place]) 10/22/2019  . Postmenopausal bleeding 09/02/2019  . Biliary colic 37/48/2707  . Breast pain, left 05/20/2019  . Trapezius muscle spasm 05/11/2019  . Elevated random blood glucose level 02/13/2019  . Epigastric pain 01/29/2019  . Prediabetes 03/18/2017  . Hypertension 03/18/2017  . Beta thalassemia trait 11/25/2013  . Class II obesity 11/19/2012  . Women's annual routine gynecological examination 07/12/2011  . Gastroesophageal reflux disease 01/28/2007  . Hyperlipidemia 10/10/2006    Past Surgical History:  Procedure Laterality Date  . [place]   right  . BREAST EXCISIONAL BIOPSY Left 2014   benign  . BREAST LUMPECTOMY WITH NEEDLE LOCALIZATION Left 09/09/2013   Procedure: BREAST LUMPECTOMY WITH NEEDLE LOCALIZATION;  Surgeon: Imogene Burn. Georgette Dover, MD;  Location: [facility];  Service: General;  Laterality: Left;  . CHOLECYSTECTOMY    . COLONOSCOPY    . IR IMAGING GUIDED PORT INSERTION  05/18/2020  . POLYPECTOMY    . ROBOTIC ASSISTED LAPAROSCOPIC CHOLECYSTECTOMY  09/09/2019  . ROBOTIC ASSISTED TOTAL HYSTERECTOMY WITH BILATERAL SALPINGO OOPHERECTOMY N/A 10/22/2019   Procedure: XI ROBOTIC ASSISTED TOTAL HYSTERECTOMY WITH BILATERAL SALPINGO OOPHORECTOMY GREATER THAN 250 GRAMS, MINI LAPARTOMY FOR SPECIMEN DELIVERY; PELVIC AND PERI-AORTIC LYMPHADENECTOMY;  Surgeon: Everitt Amber, MD;  Location: [facility];  Service: Gynecology;  Laterality: N/A;  . SENTINEL NODE BIOPSY N/A 10/22/2019   Procedure: SENTINEL NODE BIOPSY;  Surgeon: Everitt Amber, MD;  Location: [facility];  Service: Gynecology;  Laterality: N/A;     OB History    Gravida  1   Para  0   Term  0   Preterm  0   AB  1   Living  0     SAB  0   TAB  1   Ectopic  0   Multiple  0   Live Births  0           Family History  Problem Relation Age of Onset  . Diabetes  Mother   . Hypertension Mother   . Colon polyps Mother 51  . Dementia Mother 16  . Diabetes Father   . Congestive Heart Failure Father   . Pancreatic cancer Paternal Aunt   . Colon cancer Neg Hx   . Breast cancer Neg Hx   . Lung cancer Neg Hx   . Esophageal cancer Neg Hx   . Rectal cancer Neg Hx   . Stomach cancer Neg Hx     Social History   Tobacco Use  . Smoking status: Never Smoker  . Smokeless tobacco: Never Used  . Tobacco comment: few puffs but not a true smoker quit many yrs ago  Vaping Use  . Vaping Use: Never used  Substance Use Topics  . Alcohol use: Never  . Drug use: Never    Home Medications Prior to Admission medications   Medication Sig Start Date End Date Taking? Authorizing Provider  acetaminophen (TYLENOL) 500 MG tablet Take 1,000 mg by mouth every 6 (six) hours as needed for moderate pain.    [provider]  APPLE CIDER VINEGAR PO Take 5 mLs by mouth daily.     [provider]  b complex vitamins tablet Take 1 tablet by mouth daily.    [provider]  Blood Pressure Monitoring (BLOOD PRESSURE KIT) DEVI 1 Units by Does not apply route every morning. 07/01/20   Raylene Everts, MD  calcium-vitamin D (OSCAL WITH D) 500-200 MG-UNIT tablet Take 1 tablet by mouth daily with breakfast.     [provider]  chlorthalidone (HYGROTON) 25 MG tablet Take 1 tablet (25 mg total) by mouth daily. 11/22/20   Fatima Blank, MD  diphenhydrAMINE (BENADRYL) 25 MG tablet Take 25 mg by mouth every 6 (six) hours as needed for allergies.    [provider]  famotidine (PEPCID) 20 MG tablet Take 20 mg by mouth 2 (two) times daily as needed for heartburn or indigestion.    [provider]  lenvatinib 10 mg daily dose (LENVIMA, 10 MG DAILY DOSE,) capsule Take 1 capsule (10 mg total) by mouth daily. 11/08/20   [name], MD  loratadine (CLARITIN) 10 MG tablet Take 10 mg by mouth daily as needed for allergies.     [provider]  Multiple Vitamin (MULTIVITAMIN) capsule Take 1 capsule by mouth daily.    [provider]  ondansetron (ZOFRAN) 8 MG tablet Take 1 tablet (8 mg total) by mouth every 8 (eight) hours as needed. Start on the third day after chemotherapy. 05/16/20   [name], MD  oxyCODONE (OXY IR/ROXICODONE) 5 MG immediate release tablet Take 1 tablet (5 mg total) by mouth every 4 (four) hours as needed for severe pain. 11/08/20   [name], MD  polyethylene glycol (MIRALAX / GLYCOLAX) 17 g packet Take 17 g by mouth daily as needed for mild constipation.    [provider]  prochlorperazine (COMPAZINE) 10 MG  tablet Take 1 tablet (10 mg total) by mouth every 6 (six) hours as needed (Nausea or vomiting). 05/16/20   [name], MD  Vitamin D, Ergocalciferol, (DRISDOL) 1.25 MG (50000 UNIT) CAPS capsule Take 1 capsule (50,000 Units total) by mouth every 7 (seven) days for 12 doses. 10/27/20 01/13/21  Isaac Bliss, Rayford Halsted, MD  atorvastatin (LIPITOR) 20 MG tablet Take 1 tablet (20 mg total) by mouth daily. 05/03/20 07/01/20  Isaac Bliss, Rayford Halsted, MD    Allergies    Benicar hct [olmesartan medoxomil-hctz], Bactrim [sulfamethoxazole-trimethoprim], Pravastatin, Shellfish allergy, Amlodipine, and Iodinated diagnostic agents  Review of Systems   Review of Systems  Respiratory: Positive for shortness of breath.   Neurological: Positive for dizziness.  All other systems reviewed and are negative.   Physical Exam Updated Vital Signs BP (!) 139/111   Pulse 77   Temp 98.3 F (36.8 C) (Oral)   Resp 11   Ht '5\' 4"'  (1.626 m)   Wt 93.4 kg   SpO2 100%   BMI 35.36 kg/m   Physical Exam Vitals and nursing note reviewed.  Constitutional:      Appearance: She is well-developed.  HENT:     Head: Normocephalic.  Cardiovascular:     Rate and Rhythm: Normal rate.  Pulmonary:     Effort: Pulmonary effort is normal.  Abdominal:     General: There is no distension.    Musculoskeletal:        General: Normal range of motion.     Cervical back: Normal range of motion.  Skin:    General: Skin is warm.  Neurological:     General: No focal deficit present.     Mental Status: She is alert and oriented to person, place, and time.  Psychiatric:        Mood and Affect: Mood normal.     ED Results / Procedures / Treatments   Labs (all labs ordered are listed, but only abnormal results are displayed) Labs Reviewed  CBC WITH DIFFERENTIAL/PLATELET  BASIC METABOLIC PANEL    EKG None  Radiology DG Chest 2 View  Result Date: 11/25/2020 CLINICAL DATA:  Shortness of breath. EXAM: CHEST - 2 VIEW COMPARISON:  November 21, 2020 FINDINGS: There is stable right-sided venous Port-A-Cath positioning. The heart size and mediastinal contours are within normal limits. Moderate severity calcification of the aortic arch is seen. Both lungs are clear. Radiopaque surgical clips are seen overlying the lateral aspect of the left lung base. The visualized skeletal structures are unremarkable. IMPRESSION: No active cardiopulmonary disease. Electronically Signed   By: Virgina Norfolk M.D.   On: 11/25/2020 17:41    Procedures Procedures (including critical care time)  Medications Ordered in ED Medications - No data to display  ED Course  I have reviewed the triage vital signs and the nursing notes.  Pertinent labs & imaging results that were available during my care of the patient were reviewed by me and considered in my medical decision making (see chart for details).  Clinical Course as of Nov 25 2142  Fri Nov 25, 2020  2142 EKG: Rate 102 Sinus tachy Normal intervals No ischemic changes No old available due to EKG not in MUSE   [CS]    Clinical Course User Index [CS] Truddie Hidden, MD   MDM Rules/Calculators/A&P                         MDM:  Marthann Schiller no acute abnormality,  Blood pressure normal chest xray normal  Pt advised to decrease sodium intake.   Follow up with primary for recheck of blood pressure   Final Clinical Impression(s) / ED Diagnoses Final diagnoses:  Hypertension, unspecified type  Dizziness    Rx / DC Orders ED Discharge Orders    None    An After Visit Summary was printed and given to the patient.    Sidney Ace 11/25/20 2144    Truddie Hidden, MD 11/25/20 2231

## 2020-11-25 NOTE — Telephone Encounter (Signed)
-----   Message from [name], MD sent at 11/25/2020  9:08 AM EST ----- Regarding: pls call for BP readings

## 2020-11-25 NOTE — Telephone Encounter (Signed)
Called pt to see how her BP readings are.  She reports that she hasn't taken her BP but thinks that they are better by how she is feeling. Message routed to Dr Alvy Bimler.

## 2020-11-25 NOTE — Telephone Encounter (Signed)
Received vm call from pt stating that her BP is 176/116 & P 101.  Returned call & she is on her way to the ED.  She reports feeling SOB & dizzy.  Phone call got disconnected.  Tried pt back & unable to leave message b/c mailbox not set up.   Tried again & reached pt & she was already in ED. Encouraged to f/u with BP & informed to check them daily am & pm & report back to Dr Alvy Bimler.  Explained that she may need another BP med added.

## 2020-11-28 ENCOUNTER — Other Ambulatory Visit: Payer: Self-pay | Admitting: Hematology and Oncology

## 2020-11-28 ENCOUNTER — Inpatient Hospital Stay: Payer: Medicare Other | Attending: Gynecologic Oncology | Admitting: Hematology and Oncology

## 2020-11-28 ENCOUNTER — Telehealth: Payer: Self-pay

## 2020-11-28 ENCOUNTER — Other Ambulatory Visit: Payer: Self-pay

## 2020-11-28 VITALS — BP 136/87 | HR 95 | Temp 98.1°F | Resp 18 | Wt 201.0 lb

## 2020-11-28 DIAGNOSIS — Z9181 History of falling: Secondary | ICD-10-CM | POA: Diagnosis not present

## 2020-11-28 DIAGNOSIS — F419 Anxiety disorder, unspecified: Secondary | ICD-10-CM | POA: Diagnosis not present

## 2020-11-28 DIAGNOSIS — Z79899 Other long term (current) drug therapy: Secondary | ICD-10-CM | POA: Insufficient documentation

## 2020-11-28 DIAGNOSIS — C541 Malignant neoplasm of endometrium: Secondary | ICD-10-CM | POA: Diagnosis not present

## 2020-11-28 DIAGNOSIS — W19XXXA Unspecified fall, initial encounter: Secondary | ICD-10-CM

## 2020-11-28 DIAGNOSIS — I10 Essential (primary) hypertension: Secondary | ICD-10-CM | POA: Diagnosis not present

## 2020-11-28 MED ORDER — ATENOLOL 25 MG PO TABS: 25.0000 mg | ORAL_TABLET | Freq: Every day | ORAL | 3 refills | Status: DC

## 2020-11-28 MED ORDER — ALPRAZOLAM 0.25 MG PO TABS: 0.2500 mg | ORAL_TABLET | Freq: Two times a day (BID) | ORAL | 0 refills | Status: DC | PRN

## 2020-11-28 MED FILL — ATENOLOL 25 MG TABLET: 25 | 30 days supply | Qty: 30 | Fill #0

## 2020-11-28 MED FILL — ALPRAZolam 0.25 MG TABS: 0.25 | 15 days supply | Qty: 30 | Fill #0

## 2020-11-28 NOTE — Telephone Encounter (Signed)
Called and scheduled appt at 1040 today. She is aware of appt time.

## 2020-11-28 NOTE — Telephone Encounter (Signed)
-----   Message from [name], MD sent at 11/28/2020  7:53 AM EST ----- Regarding: appt I can see her today to address her BP issues She went to the ER again last weekend

## 2020-11-29 ENCOUNTER — Encounter: Payer: Self-pay | Admitting: Hematology and Oncology

## 2020-11-29 DIAGNOSIS — W19XXXA Unspecified fall, initial encounter: Secondary | ICD-10-CM | POA: Insufficient documentation

## 2020-11-29 NOTE — Assessment & Plan Note (Signed)
She has a component of anxiety which I think contributed to her sensation of shortness of breath It also aggravate her hypertension I recommend a trial of Xanax as needed I did warn her about risk of sedation

## 2020-11-29 NOTE — Progress Notes (Signed)
[place] OFFICE PROGRESS NOTE  Patient Care Team: Isaac Bliss, Rayford Halsted, MD as PCP - General (Internal Medicine) Axel Filler, MD  ASSESSMENT & PLAN:  Endometrial cancer [facility]) She has significant hypertension likely secondary to a component of anxiety as well as side effects of Lenvima We will continue treatment as scheduled  Essential hypertension She has poorly controlled hypertension and had been to the emergency room several times because of symptoms of dizziness and shortness of breath that could be aggravated by uncontrolled hypertension With single agent hydrochlorothiazide, her blood pressure is suboptimally controlled Noting her history of allergies, I recommend the addition of atenolol She is recommended to continue blood pressure monitoring twice a day and I will call her at the end of the week to see how she is doing  Mild anxiety She has a component of anxiety which I think contributed to her sensation of shortness of breath It also aggravate her hypertension I recommend a trial of Xanax as needed I did warn her about risk of sedation  Fall She did not have major injuries from her fall Observe only   No orders of the defined types were placed in this encounter.   All questions were answered. The patient knows to call the clinic with any problems, questions or concerns. The total time spent in the appointment was 20 minutes encounter with patients including review of chart and various tests results, discussions about plan of care and coordination of care plan   [name], MD 11/29/2020 7:52 AM  INTERVAL HISTORY: Please see below for problem oriented charting. She is seen urgently due to recurrent ER visits for dizziness, uncontrolled hypertension and component of anxiety According to her blood pressure documentation from home, her blood pressure is suboptimally controlled On the way to the cancer center, she slipped and fell by  the curb but did not have major injuries At present time, she denies chest pain, shortness of breath or dizziness  SUMMARY OF ONCOLOGIC HISTORY: Oncology History Overview Note  Poorly differentiated carcinoma, mixed histology with squamous differentiation, rare focus of clear cells as well as serous features MSI stable Her2 negative   Endometrial cancer ([place])  09/01/2019 Initial Diagnosis   The patient reported a history of postmenopausal bleeding that began 1 to 2 months before diagnosis   09/14/2019 Imaging   US pelvis 1. Enlarged uterus with numerous myometrial masses, presumably fibroids. 2. Endometrial thickness of 6.2 mm. In the setting of post-menopausal bleeding, endometrial sampling is indicated to exclude carcinoma. If results are benign, sonohysterogram should be considered for focal lesion work-up.  3. Nonvisualized ovaries   09/23/2019 Pathology Results   A. ENDOMETRIUM, BIOPSY:  - Poorly differentiated carcinoma   10/15/2019 Imaging   Ct scan of chest, abdomen and pelvis: No evidence of metastatic disease within the chest, abdomen, or pelvis.   Enlarged fibroid uterus.   Colonic diverticulosis. No radiographic evidence of diverticulitis.   Aortic and coronary artery atherosclerosis.     10/22/2019 Pathology Results   SURGICAL PATHOLOGY   FINAL MICROSCOPIC DIAGNOSIS:   A. UTERUS, BILATERAL TUBES AND OVARIES, HYSTERECTOMY:  Poorly differentiated carcinoma, 6.5 cm.  Lymphovascular involvement by tumor.  Carcinoma involves inner half of the myometrium.  Margins not involved.  Cervix, bilateral fallopian tubes and bilateral ovaries free of tumor.   B. LYMPH NODE, RIGHT EXTERNAL SENTINEL, BIOPSY:  One lymph node with no metastatic carcinoma (0/1).   C. LYMPH NODE, RIGHT PELVIC, BIOPSY  Five lymph nodes with  no metastatic carcinoma (0/5).   D. LYMPH NODE, LEFT PELVIC, BIOPSY:  Five lymph nodes with no metastatic carcinoma (0/5).   E. LYMPH NODE, RIGHT PERI  AORTIC, BIOPSY:  One lymph node with no metastatic carcinoma (0/1).   F. LYMPH NODE, LEFT PERI AORTIC, BIOPSY:  Five lymph nodes with no metastatic carcinoma (0/5).    ONCOLOGY TABLE:  UTERUS, CARCINOMA OR CARCINOSARCOMA   Procedure: Total hysterectomy with bilateral f-oophorectomy and sentinel  lymph nodes.  Histologic type: Poorly differentiated carcinoma, see comment.  Histologic Grade: High-grade, FIGO 3.  Myometrial invasion:       Depth of invasion: 13 mm       Myometrial thickness: 40 mm  Uterine Serosa Involvement: Not identified  Cervical stromal involvement: Not identified  Extent of involvement of other organs: Not identified  Lymphovascular invasion: Present  Regional Lymph Nodes:       Examined:     17 Sentinel                               0 non-sentinel                               17 total        Lymph nodes with metastasis: 0        Isolated tumor cells (<0.2 mm): 0        Micrometastasis:  (>0.2 mm and < 2.0 mm): 0        Macrometastasis: (>2.0 mm): 0  Representative Tumor Block: A5, A6, A7 and A8.  MMR / MSI testing: Pending  Pathologic Stage Classification (pTNM, AJCC 8th edition):  pT1a, pN0  Comments: The carcinoma is a high-grade poorly differentiated carcinoma which morphologically has predominantly serous features.  There are a few foci with squamous differentiation and a rare focus of clear cell features.  Immunohistochemistry for cytokeratin AE1/AE3 is performed on the sentinel lymph nodes and no positivity is identified.    10/22/2019 Surgery   Pre-operative Diagnosis: endometrial cancer grade 3   Post-operative Diagnosis: same,    Operation: Robotic-assisted laparoscopic total hysterectomy for uterus >250gm with bilateral salpingoophorectomy, SLN mapping, bilateral pelvic and para-aortic lymphadenectomy.   Surgeon: Donaciano Eva    Operative Findings:  : 16cm bulky fibroid uterus, normal ovaries bilaterally, no suspicious lymph nodes.      11/10/2019 Cancer Staging   Staging form: Corpus Uteri - Carcinoma and Carcinosarcoma, AJCC 8th Edition - Pathologic: Stage IVB (pT1a, pN0, cM1) - Signed by [name], MD on 05/16/2020   05/10/2020 Imaging   1. New hypodense 2.0 cm segment 4A left liver lobe mass, suspicious for hepatic metastasis. 2. New left pelvic sidewall 1.4 cm soft tissue nodule, suspicious for left internal iliac nodal metastasis. 3. New left vaginal cuff 1.6 x 1.3 cm soft tissue nodule, suspicious for recurrent tumor. 4. Aortic Atherosclerosis (ICD10-I70.0).   05/18/2020 Procedure   Successful placement of a right internal jugular approach power injectable Port-A-Cath. The catheter is ready for immediate use.   05/23/2020 -  Chemotherapy   The patient had carboplatin and taxol for chemotherapy treatment.     07/28/2020 Imaging   1. No findings identified to suggest recurrent or metastatic disease. 2. Indeterminate and slightly exophytic lesion arising from the inferior pole of left kidney measures 0.8 cm. Technically this is too small to reliably characterize. Attention on follow-up imaging is advised.  3. Aortic atherosclerosis and LAD coronary artery atherosclerotic calcifications.   11/07/2020 Imaging   1. Interval progression of right pelvic sidewall lymphadenopathy, highly concerning for disease progression. PET-CT may prove helpful to further evaluate as clinically warranted. 2. Interval resolution of the previously identified hypodense lesion in segment IVA of the liver. 3. Stable 7 mm subcapsular lesion in the lower pole left kidney with attenuation higher than would be expected for a simple cyst but is too small to reliably characterize. Attention on follow-up recommended. 4. Aortic Atherosclerosis (ICD10-I70.0).   11/16/2020 -  Chemotherapy   The patient had lenvatinib 20 mg daily dose (LENVIMA) 2 x 10 MG capsule, 10 mg (100 % of original dose 10 mg), Oral, Daily, 1 of 1 cycle, Start date: 11/08/2020,  End date: 11/08/2020 Dose modification: 10 mg (original dose 10 mg, Cycle 0) pembrolizumab (KEYTRUDA) 200 mg in sodium chloride 0.9 % 50 mL chemo infusion, 200 mg, Intravenous, Once, 1 of 6 cycles Administration: 200 mg (11/16/2020)  for chemotherapy treatment.      REVIEW OF SYSTEMS:   Constitutional: Denies fevers, chills or abnormal weight loss Eyes: Denies blurriness of vision Ears, nose, mouth, throat, and face: Denies mucositis or sore throat Respiratory: Denies cough, dyspnea or wheezes Cardiovascular: Denies palpitation, chest discomfort or lower extremity swelling Gastrointestinal:  Denies nausea, heartburn or change in bowel habits Skin: Denies abnormal skin rashes Lymphatics: Denies new lymphadenopathy or easy bruising Neurological:Denies numbness, tingling or new weaknesses Behavioral/Psych: Mood is stable, no new changes  All other systems were reviewed with the patient and are negative.  I have reviewed the past medical history, past surgical history, social history and family history with the patient and they are unchanged from previous note.  ALLERGIES:  is allergic to benicar hct [olmesartan medoxomil-hctz], bactrim [sulfamethoxazole-trimethoprim], pravastatin, shellfish allergy, amlodipine, and iodinated diagnostic agents.  MEDICATIONS:  Current Outpatient Medications  Medication Sig Dispense Refill  . aspirin EC 81 MG tablet Take 81 mg by mouth daily. Swallow whole.    . hydrochlorothiazide (HYDRODIURIL) 25 MG tablet Take 25 mg by mouth daily.    Marland Kitchen acetaminophen (TYLENOL) 500 MG tablet Take 1,000 mg by mouth every 6 (six) hours as needed for moderate pain.    Marland Kitchen ALPRAZolam (XANAX) 0.25 MG tablet Take 1 tablet (0.25 mg total) by mouth 2 (two) times daily as needed for anxiety. 30 tablet 0  . APPLE CIDER VINEGAR PO Take 5 mLs by mouth daily.     Marland Kitchen atenolol (TENORMIN) 25 MG tablet Take 1 tablet (25 mg total) by mouth daily. 30 tablet 3  . b complex vitamins tablet Take  1 tablet by mouth daily.    . Blood Pressure Monitoring (BLOOD PRESSURE KIT) DEVI 1 Units by Does not apply route every morning. 1 each 0  . calcium-vitamin D (OSCAL WITH D) 500-200 MG-UNIT tablet Take 1 tablet by mouth daily with breakfast.     . diphenhydrAMINE (BENADRYL) 25 MG tablet Take 25 mg by mouth every 6 (six) hours as needed for allergies.    . famotidine (PEPCID) 20 MG tablet Take 20 mg by mouth 2 (two) times daily as needed for heartburn or indigestion.    Marland Kitchen lenvatinib 10 mg daily dose (LENVIMA, 10 MG DAILY DOSE,) capsule Take 1 capsule (10 mg total) by mouth daily. 30 capsule 11  . loratadine (CLARITIN) 10 MG tablet Take 10 mg by mouth daily as needed for allergies.    . Multiple Vitamin (MULTIVITAMIN) capsule Take 1 capsule by mouth  daily.    . ondansetron (ZOFRAN) 8 MG tablet Take 1 tablet (8 mg total) by mouth every 8 (eight) hours as needed. Start on the third day after chemotherapy. 30 tablet 1  . oxyCODONE (OXY IR/ROXICODONE) 5 MG immediate release tablet Take 1 tablet (5 mg total) by mouth every 4 (four) hours as needed for severe pain. 30 tablet 0  . polyethylene glycol (MIRALAX / GLYCOLAX) 17 g packet Take 17 g by mouth daily as needed for mild constipation.    . prochlorperazine (COMPAZINE) 10 MG tablet Take 1 tablet (10 mg total) by mouth every 6 (six) hours as needed (Nausea or vomiting). 30 tablet 1  . Vitamin D, Ergocalciferol, (DRISDOL) 1.25 MG (50000 UNIT) CAPS capsule Take 1 capsule (50,000 Units total) by mouth every 7 (seven) days for 12 doses. 12 capsule 0   No current facility-administered medications for this visit.    PHYSICAL EXAMINATION: ECOG PERFORMANCE STATUS: 1 - Symptomatic but completely ambulatory  Vitals:   11/28/20 1034  BP: 136/87  Pulse: 95  Resp: 18  Temp: 98.1 F (36.7 C)  SpO2: 99%   Filed Weights   11/28/20 1034  Weight: 201 lb (91.2 kg)    GENERAL:alert, no distress and comfortable Musculoskeletal:no cyanosis of digits and no  clubbing  NEURO: alert & oriented x 3 with fluent speech, no focal motor/sensory deficits  LABORATORY DATA:  I have reviewed the data as listed    Component Value Date/Time   NA 136 11/25/2020 2047   NA 140 07/04/2017 0952   K 3.5 11/25/2020 2047   CL 99 11/25/2020 2047   CO2 26 11/25/2020 2047   GLUCOSE 121 (H) 11/25/2020 2047   BUN 17 11/25/2020 2047   BUN 10 07/04/2017 0952   CREATININE 0.81 11/25/2020 2047   CREATININE 0.82 11/16/2020 1149   CREATININE 0.81 03/23/2015 0946   CALCIUM 9.5 11/25/2020 2047   PROT 7.2 11/16/2020 1149   ALBUMIN 3.5 11/16/2020 1149   AST 41 11/16/2020 1149   ALT <6 11/16/2020 1149   ALKPHOS 87 11/16/2020 1149   BILITOT 0.3 11/16/2020 1149   GFRNONAA >60 11/25/2020 2047   GFRNONAA >60 11/16/2020 1149   GFRNONAA 78 03/23/2015 0946   GFRAA >60 09/12/2020 0853   GFRAA >89 03/23/2015 0946    No results found for: SPEP, UPEP  Lab Results  Component Value Date   WBC 5.3 11/25/2020   NEUTROABS 3.5 11/25/2020   HGB 11.8 (L) 11/25/2020   HCT 36.8 11/25/2020   MCV 76.8 (L) 11/25/2020   PLT 255 11/25/2020      Chemistry      Component Value Date/Time   NA 136 11/25/2020 2047   NA 140 07/04/2017 0952   K 3.5 11/25/2020 2047   CL 99 11/25/2020 2047   CO2 26 11/25/2020 2047   BUN 17 11/25/2020 2047   BUN 10 07/04/2017 0952   CREATININE 0.81 11/25/2020 2047   CREATININE 0.82 11/16/2020 1149   CREATININE 0.81 03/23/2015 0946      Component Value Date/Time   CALCIUM 9.5 11/25/2020 2047   ALKPHOS 87 11/16/2020 1149   AST 41 11/16/2020 1149   ALT <6 11/16/2020 1149   BILITOT 0.3 11/16/2020 1149

## 2020-11-29 NOTE — Assessment & Plan Note (Signed)
She has significant hypertension likely secondary to a component of anxiety as well as side effects of Lenvima We will continue treatment as scheduled

## 2020-11-29 NOTE — Assessment & Plan Note (Signed)
She did not have major injuries from her fall Observe only

## 2020-11-29 NOTE — Assessment & Plan Note (Signed)
She has poorly controlled hypertension and had been to the emergency room several times because of symptoms of dizziness and shortness of breath that could be aggravated by uncontrolled hypertension With single agent hydrochlorothiazide, her blood pressure is suboptimally controlled Noting her history of allergies, I recommend the addition of atenolol She is recommended to continue blood pressure monitoring twice a day and I will call her at the end of the week to see how she is doing

## 2020-12-01 ENCOUNTER — Telehealth: Payer: Self-pay

## 2020-12-01 NOTE — Telephone Encounter (Signed)
Very nice BP Continue same

## 2020-12-01 NOTE — Telephone Encounter (Signed)
-----   Message from [name], MD sent at 12/01/2020  9:49 AM EST ----- Regarding: can you call how her BP is doing

## 2020-12-01 NOTE — Telephone Encounter (Signed)
Called and given below message. She verbalized understanding. 

## 2020-12-01 NOTE — Telephone Encounter (Signed)
Called and given below message. She verbalized understanding. 11/30 am bp 133/83 and pulse 73, pm bp 146/91 and pulse 87 12/1 am 123/85 and pulse 74, pm 141/95 and pulse 82 12/2 am bp 137/85 and pulse 59. No complaints.

## 2020-12-07 ENCOUNTER — Inpatient Hospital Stay: Payer: Medicare Other

## 2020-12-07 ENCOUNTER — Inpatient Hospital Stay: Payer: Medicare Other | Attending: Gynecologic Oncology

## 2020-12-07 ENCOUNTER — Inpatient Hospital Stay (HOSPITAL_BASED_OUTPATIENT_CLINIC_OR_DEPARTMENT_OTHER): Payer: Medicare Other | Admitting: Hematology and Oncology

## 2020-12-07 ENCOUNTER — Encounter: Payer: Self-pay | Admitting: Hematology and Oncology

## 2020-12-07 ENCOUNTER — Other Ambulatory Visit: Payer: Self-pay

## 2020-12-07 DIAGNOSIS — I10 Essential (primary) hypertension: Secondary | ICD-10-CM | POA: Diagnosis not present

## 2020-12-07 DIAGNOSIS — Z5111 Encounter for antineoplastic chemotherapy: Secondary | ICD-10-CM | POA: Insufficient documentation

## 2020-12-07 DIAGNOSIS — C541 Malignant neoplasm of endometrium: Secondary | ICD-10-CM | POA: Insufficient documentation

## 2020-12-07 DIAGNOSIS — Z79899 Other long term (current) drug therapy: Secondary | ICD-10-CM | POA: Insufficient documentation

## 2020-12-07 DIAGNOSIS — Z7189 Other specified counseling: Secondary | ICD-10-CM

## 2020-12-07 DIAGNOSIS — G893 Neoplasm related pain (acute) (chronic): Secondary | ICD-10-CM

## 2020-12-07 LAB — CMP (CANCER CENTER ONLY)
ALT: 9 U/L (ref 0–44)
AST: 18 U/L (ref 15–41)
Albumin: 3.2 g/dL — ABNORMAL LOW (ref 3.5–5.0)
Alkaline Phosphatase: 107 U/L (ref 38–126)
Anion gap: 8 (ref 5–15)
BUN: 10 mg/dL (ref 8–23)
CO2: 28 mmol/L (ref 22–32)
Calcium: 9.1 mg/dL (ref 8.9–10.3)
Chloride: 101 mmol/L (ref 98–111)
Creatinine: 0.81 mg/dL (ref 0.44–1.00)
GFR, Estimated: >60 mL/min (ref >60)
Glucose, Bld: 103 mg/dL — ABNORMAL HIGH (ref 70–99)
Potassium: 3.5 mmol/L (ref 3.5–5.1)
Sodium: 137 mmol/L (ref 135–145)
Total Bilirubin: 0.3 mg/dL (ref 0.3–1.2)
Total Protein: 6.8 g/dL (ref 6.5–8.1)

## 2020-12-07 LAB — CBC WITH DIFFERENTIAL (CANCER CENTER ONLY)
Abs Immature Granulocytes: 0.02 10*3/uL (ref 0.00–0.07)
Basophils Absolute: 0 10*3/uL (ref 0.0–0.1)
Basophils Relative: 1 %
Eosinophils Absolute: 0.1 10*3/uL (ref 0.0–0.5)
Eosinophils Relative: 1 %
HCT: 35.5 % — ABNORMAL LOW (ref 36.0–46.0)
Hemoglobin: 11.2 g/dL — ABNORMAL LOW (ref 12.0–15.0)
Immature Granulocytes: 1 %
Lymphocytes Relative: 36 %
Lymphs Abs: 1.3 10*3/uL (ref 0.7–4.0)
MCH: 23.6 pg — ABNORMAL LOW (ref 26.0–34.0)
MCHC: 31.5 g/dL (ref 30.0–36.0)
MCV: 74.7 fL — ABNORMAL LOW (ref 80.0–100.0)
Monocytes Absolute: 0.3 10*3/uL (ref 0.1–1.0)
Monocytes Relative: 8 %
Neutro Abs: 2.1 10*3/uL (ref 1.7–7.7)
Neutrophils Relative %: 53 %
Platelet Count: 212 10*3/uL (ref 150–400)
RBC: 4.75 MIL/uL (ref 3.87–5.11)
RDW: 16.1 % — ABNORMAL HIGH (ref 11.5–15.5)
WBC Count: 3.8 10*3/uL — ABNORMAL LOW (ref 4.0–10.5)
nRBC: 0 % (ref 0.0–0.2)

## 2020-12-07 LAB — TOTAL PROTEIN, URINE DIPSTICK: Protein, ur: NEGATIVE mg/dL

## 2020-12-07 LAB — TSH: TSH: 3.141 u[IU]/mL (ref 0.308–3.960)

## 2020-12-07 MED ORDER — SODIUM CHLORIDE 0.9% FLUSH
10.0000 mL | INTRAVENOUS | Status: DC | PRN
  Administered 2020-12-07: 10 mL
  Filled 2020-12-07: qty 10

## 2020-12-07 MED ORDER — SODIUM CHLORIDE 0.9 % IV SOLN
200.0000 mg | Freq: Once | INTRAVENOUS | Status: AC
  Administered 2020-12-07: 200 mg via INTRAVENOUS
  Filled 2020-12-07: qty 8

## 2020-12-07 MED ORDER — SODIUM CHLORIDE 0.9% FLUSH
10.0000 mL | Freq: Once | INTRAVENOUS | Status: AC
  Administered 2020-12-07: 10 mL
  Filled 2020-12-07: qty 10

## 2020-12-07 MED ORDER — SODIUM CHLORIDE 0.9 % IV SOLN
Freq: Once | INTRAVENOUS | Status: AC
  Filled 2020-12-07: qty 250

## 2020-12-07 MED ORDER — HYDROCHLOROTHIAZIDE 25 MG PO TABS
25.0000 mg | ORAL_TABLET | Freq: Every day | ORAL | 1 refills | Status: DC
Start: 2020-12-07 — End: 2021-01-04

## 2020-12-07 MED ORDER — HEPARIN SOD (PORK) LOCK FLUSH 100 UNIT/ML IV SOLN
500.0000 [IU] | Freq: Once | INTRAVENOUS | Status: AC | PRN
  Administered 2020-12-07: 500 [IU]
  Filled 2020-12-07: qty 5

## 2020-12-07 MED FILL — LENVIMA 10 MG DAILY DOSE: 10 | 30 days supply | Qty: 30 | Fill #1

## 2020-12-07 NOTE — Assessment & Plan Note (Signed)
She has chronic musculoskeletal pain that could be related to severe vitamin D deficiency She also have mild cancer associated pain and appears to be somewhat reluctant to take pain medicine We discussed the importance of taking medication as needed to get her pain under control

## 2020-12-07 NOTE — Assessment & Plan Note (Signed)
Her blood pressure control is improved since introduction of atenolol I reinforced the importance of taking her medication on the regular basis and I will get my nursing staff to call her again next week for further follow-up

## 2020-12-07 NOTE — Assessment & Plan Note (Signed)
She is doing well today Her blood pressure control has improved We discussed minimum 4 cycles of treatment before repeating imaging study We will continue treatment as scheduled

## 2020-12-07 NOTE — Patient Instructions (Signed)
[facility] Discharge Instructions for Patients Receiving Chemotherapy  Today you received the following chemotherapy agents:  Keytruda.  To help prevent nausea and vomiting after your treatment, we encourage you to take your nausea medication as directed.   If you develop nausea and vomiting that is not controlled by your nausea medication, call the clinic.   BELOW ARE SYMPTOMS THAT SHOULD BE REPORTED IMMEDIATELY:  *FEVER GREATER THAN 100.5 F  *CHILLS WITH OR WITHOUT FEVER  NAUSEA AND VOMITING THAT IS NOT CONTROLLED WITH YOUR NAUSEA MEDICATION  *UNUSUAL SHORTNESS OF BREATH  *UNUSUAL BRUISING OR BLEEDING  TENDERNESS IN MOUTH AND THROAT WITH OR WITHOUT PRESENCE OF ULCERS  *URINARY PROBLEMS  *BOWEL PROBLEMS  UNUSUAL RASH Items with * indicate a potential emergency and should be followed up as soon as possible.  Feel free to call the clinic should you have any questions or concerns. The clinic phone number is [phone].  Please show the CHEMO ALERT CARD at check-in to the Emergency Department and triage nurse.    

## 2020-12-07 NOTE — Progress Notes (Signed)
[place] OFFICE PROGRESS NOTE  Patient Care Team: Isaac Bliss, Rayford Halsted, MD as PCP - General (Internal Medicine) Axel Filler, MD  ASSESSMENT & PLAN:  Endometrial cancer [facility]) She is doing well today Her blood pressure control has improved We discussed minimum 4 cycles of treatment before repeating imaging study We will continue treatment as scheduled  Essential hypertension Her blood pressure control is improved since introduction of atenolol I reinforced the importance of taking her medication on the regular basis and I will get my nursing staff to call her again next week for further follow-up  Cancer associated pain She has chronic musculoskeletal pain that could be related to severe vitamin D deficiency She also have mild cancer associated pain and appears to be somewhat reluctant to take pain medicine We discussed the importance of taking medication as needed to get her pain under control   No orders of the defined types were placed in this encounter.   All questions were answered. The patient knows to call the clinic with any problems, questions or concerns. The total time spent in the appointment was 20 minutes encounter with patients including review of chart and various tests results, discussions about plan of care and coordination of care plan   [name], MD 12/07/2020 9:43 AM  INTERVAL HISTORY: Please see below for problem oriented charting. She returns for further follow-up She is doing better with blood pressure monitoring at home I reviewed the documentation of her blood pressure and noticed majority of his systolic blood pressure is around 778E and diastolic around lower 42P Her heart rate is intermittently elevated but at times slightly under 60 She did not take her blood pressure medication this morning She is taking high-dose vitamin D for diffuse musculoskeletal pain.  She rarely take oxycodone  SUMMARY OF ONCOLOGIC  HISTORY: Oncology History Overview Note  Poorly differentiated carcinoma, mixed histology with squamous differentiation, rare focus of clear cells as well as serous features MSI stable Her2 negative   Endometrial cancer ([place])  09/01/2019 Initial Diagnosis   The patient reported a history of postmenopausal bleeding that began 1 to 2 months before diagnosis   09/14/2019 Imaging   US pelvis 1. Enlarged uterus with numerous myometrial masses, presumably fibroids. 2. Endometrial thickness of 6.2 mm. In the setting of post-menopausal bleeding, endometrial sampling is indicated to exclude carcinoma. If results are benign, sonohysterogram should be considered for focal lesion work-up.  3. Nonvisualized ovaries   09/23/2019 Pathology Results   A. ENDOMETRIUM, BIOPSY:  - Poorly differentiated carcinoma   10/15/2019 Imaging   Ct scan of chest, abdomen and pelvis: No evidence of metastatic disease within the chest, abdomen, or pelvis.   Enlarged fibroid uterus.   Colonic diverticulosis. No radiographic evidence of diverticulitis.   Aortic and coronary artery atherosclerosis.     10/22/2019 Pathology Results   SURGICAL PATHOLOGY   FINAL MICROSCOPIC DIAGNOSIS:   A. UTERUS, BILATERAL TUBES AND OVARIES, HYSTERECTOMY:  Poorly differentiated carcinoma, 6.5 cm.  Lymphovascular involvement by tumor.  Carcinoma involves inner half of the myometrium.  Margins not involved.  Cervix, bilateral fallopian tubes and bilateral ovaries free of tumor.   B. LYMPH NODE, RIGHT EXTERNAL SENTINEL, BIOPSY:  One lymph node with no metastatic carcinoma (0/1).   C. LYMPH NODE, RIGHT PELVIC, BIOPSY  Five lymph nodes with no metastatic carcinoma (0/5).   D. LYMPH NODE, LEFT PELVIC, BIOPSY:  Five lymph nodes with no metastatic carcinoma (0/5).   E. LYMPH NODE, RIGHT PERI  AORTIC, BIOPSY:  One lymph node with no metastatic carcinoma (0/1).   F. LYMPH NODE, LEFT PERI AORTIC, BIOPSY:  Five lymph nodes with no  metastatic carcinoma (0/5).    ONCOLOGY TABLE:  UTERUS, CARCINOMA OR CARCINOSARCOMA   Procedure: Total hysterectomy with bilateral f-oophorectomy and sentinel  lymph nodes.  Histologic type: Poorly differentiated carcinoma, see comment.  Histologic Grade: High-grade, FIGO 3.  Myometrial invasion:       Depth of invasion: 13 mm       Myometrial thickness: 40 mm  Uterine Serosa Involvement: Not identified  Cervical stromal involvement: Not identified  Extent of involvement of other organs: Not identified  Lymphovascular invasion: Present  Regional Lymph Nodes:       Examined:     17 Sentinel                               0 non-sentinel                               17 total        Lymph nodes with metastasis: 0        Isolated tumor cells (<0.2 mm): 0        Micrometastasis:  (>0.2 mm and < 2.0 mm): 0        Macrometastasis: (>2.0 mm): 0  Representative Tumor Block: A5, A6, A7 and A8.  MMR / MSI testing: Pending  Pathologic Stage Classification (pTNM, AJCC 8th edition):  pT1a, pN0  Comments: The carcinoma is a high-grade poorly differentiated carcinoma which morphologically has predominantly serous features.  There are a few foci with squamous differentiation and a rare focus of clear cell features.  Immunohistochemistry for cytokeratin AE1/AE3 is performed on the sentinel lymph nodes and no positivity is identified.    10/22/2019 Surgery   Pre-operative Diagnosis: endometrial cancer grade 3   Post-operative Diagnosis: same,    Operation: Robotic-assisted laparoscopic total hysterectomy for uterus >250gm with bilateral salpingoophorectomy, SLN mapping, bilateral pelvic and para-aortic lymphadenectomy.   Surgeon: Donaciano Eva    Operative Findings:  : 16cm bulky fibroid uterus, normal ovaries bilaterally, no suspicious lymph nodes.     11/10/2019 Cancer Staging   Staging form: Corpus Uteri - Carcinoma and Carcinosarcoma, AJCC 8th Edition - Pathologic: Stage IVB (pT1a,  pN0, cM1) - Signed by [name], MD on 05/16/2020   05/10/2020 Imaging   1. New hypodense 2.0 cm segment 4A left liver lobe mass, suspicious for hepatic metastasis. 2. New left pelvic sidewall 1.4 cm soft tissue nodule, suspicious for left internal iliac nodal metastasis. 3. New left vaginal cuff 1.6 x 1.3 cm soft tissue nodule, suspicious for recurrent tumor. 4. Aortic Atherosclerosis (ICD10-I70.0).   05/18/2020 Procedure   Successful placement of a right internal jugular approach power injectable Port-A-Cath. The catheter is ready for immediate use.   05/23/2020 -  Chemotherapy   The patient had carboplatin and taxol for chemotherapy treatment.     07/28/2020 Imaging   1. No findings identified to suggest recurrent or metastatic disease. 2. Indeterminate and slightly exophytic lesion arising from the inferior pole of left kidney measures 0.8 cm. Technically this is too small to reliably characterize. Attention on follow-up imaging is advised. 3. Aortic atherosclerosis and LAD coronary artery atherosclerotic calcifications.   11/07/2020 Imaging   1. Interval progression of right pelvic sidewall lymphadenopathy, highly concerning for disease progression.  PET-CT may prove helpful to further evaluate as clinically warranted. 2. Interval resolution of the previously identified hypodense lesion in segment IVA of the liver. 3. Stable 7 mm subcapsular lesion in the lower pole left kidney with attenuation higher than would be expected for a simple cyst but is too small to reliably characterize. Attention on follow-up recommended. 4. Aortic Atherosclerosis (ICD10-I70.0).   11/16/2020 -  Chemotherapy   The patient had lenvatinib 20 mg daily dose (LENVIMA) 2 x 10 MG capsule, 10 mg (100 % of original dose 10 mg), Oral, Daily, 1 of 1 cycle, Start date: 11/08/2020, End date: 11/08/2020 Dose modification: 10 mg (original dose 10 mg, Cycle 0) pembrolizumab (KEYTRUDA) 200 mg in sodium chloride 0.9 % 50 mL  chemo infusion, 200 mg, Intravenous, Once, 2 of 6 cycles Administration: 200 mg (11/16/2020)  for chemotherapy treatment.      REVIEW OF SYSTEMS:   Constitutional: Denies fevers, chills or abnormal weight loss Eyes: Denies blurriness of vision Ears, nose, mouth, throat, and face: Denies mucositis or sore throat Respiratory: Denies cough, dyspnea or wheezes Cardiovascular: Denies palpitation, chest discomfort or lower extremity swelling Gastrointestinal:  Denies nausea, heartburn or change in bowel habits Skin: Denies abnormal skin rashes Lymphatics: Denies new lymphadenopathy or easy bruising Neurological:Denies numbness, tingling or new weaknesses Behavioral/Psych: Mood is stable, no new changes  All other systems were reviewed with the patient and are negative.  I have reviewed the past medical history, past surgical history, social history and family history with the patient and they are unchanged from previous note.  ALLERGIES:  is allergic to benicar hct [olmesartan medoxomil-hctz], bactrim [sulfamethoxazole-trimethoprim], pravastatin, shellfish allergy, amlodipine, and iodinated diagnostic agents.  MEDICATIONS:  Current Outpatient Medications  Medication Sig Dispense Refill  . acetaminophen (TYLENOL) 500 MG tablet Take 1,000 mg by mouth every 6 (six) hours as needed for moderate pain.    Marland Kitchen ALPRAZolam (XANAX) 0.25 MG tablet Take 1 tablet (0.25 mg total) by mouth 2 (two) times daily as needed for anxiety. 30 tablet 0  . APPLE CIDER VINEGAR PO Take 5 mLs by mouth daily.     Marland Kitchen aspirin EC 81 MG tablet Take 81 mg by mouth daily. Swallow whole.    Marland Kitchen atenolol (TENORMIN) 25 MG tablet Take 1 tablet (25 mg total) by mouth daily. 30 tablet 3  . b complex vitamins tablet Take 1 tablet by mouth daily.    . Blood Pressure Monitoring (BLOOD PRESSURE KIT) DEVI 1 Units by Does not apply route every morning. 1 each 0  . calcium-vitamin D (OSCAL WITH D) 500-200 MG-UNIT tablet Take 1 tablet by  mouth daily with breakfast.     . diphenhydrAMINE (BENADRYL) 25 MG tablet Take 25 mg by mouth every 6 (six) hours as needed for allergies.    . famotidine (PEPCID) 20 MG tablet Take 20 mg by mouth 2 (two) times daily as needed for heartburn or indigestion.    . hydrochlorothiazide (HYDRODIURIL) 25 MG tablet Take 1 tablet (25 mg total) by mouth daily. 90 tablet 1  . lenvatinib 10 mg daily dose (LENVIMA, 10 MG DAILY DOSE,) capsule Take 1 capsule (10 mg total) by mouth daily. 30 capsule 11  . loratadine (CLARITIN) 10 MG tablet Take 10 mg by mouth daily as needed for allergies.    . Multiple Vitamin (MULTIVITAMIN) capsule Take 1 capsule by mouth daily.    . ondansetron (ZOFRAN) 8 MG tablet Take 1 tablet (8 mg total) by mouth every 8 (eight) hours as needed.  Start on the third day after chemotherapy. 30 tablet 1  . oxyCODONE (OXY IR/ROXICODONE) 5 MG immediate release tablet Take 1 tablet (5 mg total) by mouth every 4 (four) hours as needed for severe pain. 30 tablet 0  . polyethylene glycol (MIRALAX / GLYCOLAX) 17 g packet Take 17 g by mouth daily as needed for mild constipation.    . prochlorperazine (COMPAZINE) 10 MG tablet Take 1 tablet (10 mg total) by mouth every 6 (six) hours as needed (Nausea or vomiting). 30 tablet 1  . Vitamin D, Ergocalciferol, (DRISDOL) 1.25 MG (50000 UNIT) CAPS capsule Take 1 capsule (50,000 Units total) by mouth every 7 (seven) days for 12 doses. 12 capsule 0   No current facility-administered medications for this visit.   Facility-Administered Medications Ordered in Other Visits  Medication Dose Route Frequency Provider Last Rate Last Admin  . heparin lock flush 100 unit/mL  500 Units Intracatheter Once PRN Alvy Bimler, Courtenay Creger, MD      . pembrolizumab (KEYTRUDA) 200 mg in sodium chloride 0.9 % 50 mL chemo infusion  200 mg Intravenous Once [name], MD 116 mL/hr at 12/07/20 0924 200 mg at 12/07/20 0924  . sodium chloride flush (NS) 0.9 % injection 10 mL  10 mL Intracatheter PRN  Alvy Bimler, Compton Brigance, MD        PHYSICAL EXAMINATION: ECOG PERFORMANCE STATUS: 1 - Symptomatic but completely ambulatory  Vitals:   12/07/20 0804  BP: 134/80  Pulse: (!) 58  Resp: 17  Temp: 99 F (37.2 C)  SpO2: 100%   Filed Weights   12/07/20 0804  Weight: 200 lb 6.4 oz (90.9 kg)    GENERAL:alert, no distress and comfortable NEURO: alert & oriented x 3 with fluent speech, no focal motor/sensory deficits  LABORATORY DATA:  I have reviewed the data as listed    Component Value Date/Time   NA 137 12/07/2020 0714   NA 140 07/04/2017 0952   K 3.5 12/07/2020 0714   CL 101 12/07/2020 0714   CO2 28 12/07/2020 0714   GLUCOSE 103 (H) 12/07/2020 0714   BUN 10 12/07/2020 0714   BUN 10 07/04/2017 0952   CREATININE 0.81 12/07/2020 0714   CREATININE 0.81 03/23/2015 0946   CALCIUM 9.1 12/07/2020 0714   PROT 6.8 12/07/2020 0714   ALBUMIN 3.2 (L) 12/07/2020 0714   AST 18 12/07/2020 0714   ALT 9 12/07/2020 0714   ALKPHOS 107 12/07/2020 0714   BILITOT 0.3 12/07/2020 0714   GFRNONAA >60 12/07/2020 0714   GFRNONAA 78 03/23/2015 0946   GFRAA >60 09/12/2020 0853   GFRAA >89 03/23/2015 0946    No results found for: SPEP, UPEP  Lab Results  Component Value Date   WBC 3.8 (L) 12/07/2020   NEUTROABS 2.1 12/07/2020   HGB 11.2 (L) 12/07/2020   HCT 35.5 (L) 12/07/2020   MCV 74.7 (L) 12/07/2020   PLT 212 12/07/2020      Chemistry      Component Value Date/Time   NA 137 12/07/2020 0714   NA 140 07/04/2017 0952   K 3.5 12/07/2020 0714   CL 101 12/07/2020 0714   CO2 28 12/07/2020 0714   BUN 10 12/07/2020 0714   BUN 10 07/04/2017 0952   CREATININE 0.81 12/07/2020 0714   CREATININE 0.81 03/23/2015 0946      Component Value Date/Time   CALCIUM 9.1 12/07/2020 0714   ALKPHOS 107 12/07/2020 0714   AST 18 12/07/2020 0714   ALT 9 12/07/2020 0714   BILITOT 0.3 12/07/2020  0714      

## 2020-12-08 LAB — T4: T4, Total: 5.7 ug/dL (ref 4.5–12.0)

## 2020-12-09 ENCOUNTER — Encounter: Payer: Self-pay | Admitting: Physical Therapy

## 2020-12-09 ENCOUNTER — Ambulatory Visit: Payer: Medicare Other | Attending: Internal Medicine | Admitting: Physical Therapy

## 2020-12-09 ENCOUNTER — Other Ambulatory Visit: Payer: Self-pay

## 2020-12-09 DIAGNOSIS — R262 Difficulty in walking, not elsewhere classified: Secondary | ICD-10-CM | POA: Insufficient documentation

## 2020-12-09 DIAGNOSIS — M6281 Muscle weakness (generalized): Secondary | ICD-10-CM | POA: Diagnosis not present

## 2020-12-09 DIAGNOSIS — M25551 Pain in right hip: Secondary | ICD-10-CM | POA: Diagnosis not present

## 2020-12-09 NOTE — Therapy (Signed)
[facility], [place], [address] Phone: [phone]   Fax:  [phone]  Physical Therapy Evaluation  Patient Details  Name: Tina Stone MRN: [id number] Date of Birth: 09-Dec-1950 Referring Provider (PT): Isaac Bliss, Rayford Halsted, MD   Encounter Date: 12/09/2020   PT End of Session - 12/09/20 0758    Visit Number 1    Number of Visits 13    Date for PT Re-Evaluation 01/21/21    Authorization Type [employer] MCR    Authorization Time Period FOTO visit 6, PN visit 10    PT Start Time 0756    PT Stop Time 0839    PT Time Calculation (min) 43 min    Activity Tolerance Patient tolerated treatment well    Behavior During Therapy [facility] for tasks assessed/performed           Past Medical History:  Diagnosis Date  . Adenomatous colon polyp   . Allergic rhinitis, seasonal   . Allergy   . Beta thalassemia trait 11/25/2013  . Cholelithiasis   . Class 3 obesity without serious comorbidity with body mass index (BMI) of 40.0 to 44.9 in adult 11/19/2012  . endometrial ca dx'd 08/2009   endometrial   . GERD (gastroesophageal reflux disease)   . HLD (hyperlipidemia)   . Hypercholesterolemia   . Hypertension 03/18/2017   no meds   . Intraductal papilloma of left breast    Patient underwent left needle-localized lumpectomy by Dr. Imogene Burn. Ts[place]i on 09/09/2013; pathology showed intraductal papilloma with no atypia or malignancy identified.  Marland Kitchen PONV (postoperative nausea and vomiting)   . Pre-diabetes    pt denies  . Uterine fibroid     Past Surgical History:  Procedure Laterality Date  . [place]   right  . BREAST EXCISIONAL BIOPSY Left 2014   benign  . BREAST LUMPECTOMY WITH NEEDLE LOCALIZATION Left 09/09/2013   Procedure: BREAST LUMPECTOMY WITH NEEDLE LOCALIZATION;  Surgeon: Imogene Burn. Georgette Dover, MD;  Location: [facility];  Service: General;  Laterality: Left;  . CHOLECYSTECTOMY    . COLONOSCOPY     . IR IMAGING GUIDED PORT INSERTION  05/18/2020  . POLYPECTOMY    . ROBOTIC ASSISTED LAPAROSCOPIC CHOLECYSTECTOMY  09/09/2019  . ROBOTIC ASSISTED TOTAL HYSTERECTOMY WITH BILATERAL SALPINGO OOPHERECTOMY N/A 10/22/2019   Procedure: XI ROBOTIC ASSISTED TOTAL HYSTERECTOMY WITH BILATERAL SALPINGO OOPHORECTOMY GREATER THAN 250 GRAMS, MINI LAPARTOMY FOR SPECIMEN DELIVERY; PELVIC AND PERI-AORTIC LYMPHADENECTOMY;  Surgeon: Everitt Amber, MD;  Location: [facility];  Service: Gynecology;  Laterality: N/A;  . SENTINEL NODE BIOPSY N/A 10/22/2019   Procedure: SENTINEL NODE BIOPSY;  Surgeon: Everitt Amber, MD;  Location: [facility];  Service: Gynecology;  Laterality: N/A;    There were no vitals filed for this visit.    Subjective Assessment - 12/09/20 0801    Subjective I thought it was just arthritis because it came with change of season. through screening for hip pain they found CA. Cannot lay on Rt side too long. Most pain mostly at night when I am laying down. [facility] but [facility] when [facility] came on, returned when appoinments were available and then stopped again about 6-8 mo ago.    How long can you sit comfortably? 30 min to an hour= hard to get up    How long can you walk comfortably? walking is not the problem, standing is    Patient Stated Goals decrease pain,  improve mobility, swimming & walking    Currently in Pain? Yes    Pain Score 5     Pain Location Hip    Pain Orientation Right    Pain Descriptors / Indicators Aching    Aggravating Factors  standing    Pain Relieving Factors rest, pain meds              [facility] PT Assessment - 12/09/20 0001      Assessment   Medical Diagnosis Rt hip pain    Referring Provider (PT) Isaac Bliss, Rayford Halsted, MD    Onset Date/Surgical Date --   change of season a few months ago   Hand Dominance Left    Prior Therapy no      Precautions   Precautions --    Precaution Comments Active Cancer- endometrial      Restrictions    Weight Bearing Restrictions No      Balance Screen   Has the patient fallen in the past 6 months Yes    How many times? 1    Has the patient had a decrease in activity level because of a fear of falling?  No    Is the patient reluctant to leave their home because of a fear of falling?  No      Home Ecologist residence    Living Arrangements Other relatives    Additional Comments no stairs      Prior Function   Level of Independence Independent    Vocation Requirements caregiver for agency      Cognition   Overall Cognitive Status Within Functional Limits for tasks assessed      Observation/Other Assessments   Focus on Therapeutic Outcomes (FOTO)  56%      Sensation   Additional Comments neuropathy creates a little numbness in feet      ROM / Strength   AROM / PROM / Strength AROM;Strength      AROM   Overall AROM Comments mild limitations in hip rotation with some discomfort      Strength   Overall Strength Comments gross 3-/5      Palpation   Palpation comment TTP at greater trochanter      Ambulation/Gait   Gait Comments SPC, antalgic pattern                      Objective measurements completed on examination: See above findings.       [place] Adult PT Treatment/Exercise - 12/09/20 0001      Exercises   Exercises Knee/Hip      Knee/Hip Exercises: Stretches   Hip Flexor Stretch Limitations modified thomas    Other Knee/Hip Stretches supine long leg IR/ER    Other Knee/Hip Stretches butterfly      Knee/Hip Exercises: Standing   Hip Abduction Right;15 reps    Hip Extension Right;15 reps                  PT Education - 12/09/20 0844    Education Details anatomy of condition, POC, HEP, exercise form/rationale, FOTO    Person(s) Educated Patient    Methods Explanation;Demonstration;Tactile cues;Verbal cues;Handout    Comprehension Verbalized understanding;Returned demonstration;Verbal cues  required;Tactile cues required;Need further instruction            PT Short Term Goals - 12/09/20 0849      PT SHORT TERM GOAL #1   Title Pt will demo heel-toe gait pattern  Baseline flat foot strike with antalgic pattern at eval    Time 3    Period Weeks    Status New    Target Date 12/30/20      PT SHORT TERM GOAL #2   Title pt will verbalize ability to use HEP to improve daily mobility of hip joint    Baseline began establishing at eval    Time 3    Period Weeks    Status New    Target Date 12/30/20             PT Long Term Goals - 12/09/20 0846      PT LONG TERM GOAL #1   Title pt will ambulate without antalgic gait pattern with use of LRAD    Baseline using SPC with significant antalgic pattern at eval    Time 6    Period Weeks    Status New    Target Date 01/21/21      PT LONG TERM GOAL #2   Title pain <=2/10 with ADLS    Baseline 6-0/73 normally reported at eval    Time 6    Period Weeks    Status New    Target Date 01/21/21      PT LONG TERM GOAL #3   Title pt will be independent in long term aquatics strengthening program to continue after d/c    Baseline has not been in the pool in about 6-8 months but would like to return    Time 6    Period Weeks    Status New    Target Date 01/21/21      PT LONG TERM GOAL #4   Title gross hip strength to 4+/5    Baseline 3-/5 at eval    Time 6    Period Weeks    Status New    Target Date 01/21/21                  Plan - 12/09/20 7106    Clinical Impression Statement Pt presents to PT with complaints of Rt hip pain that began a few months ago. Through screening a cancerous lesion was found and was diagnosed with endometrial CA. Xray report states suspicious for lesion in proximal humeru and has been referred for MRI to confirm/deny. Ambulates with SPC and antalgic pattern and reports overall feeling her mobility has decreased. Prior to pain, pt was in the pool at the [facility] for exercise 3 days/week  and agreed that aquatic therapy would be beneficial for her. Pt will beneift from skilled PT to address deficits outlined in flowsheets and reach functional goals.    Personal Factors and Comorbidities Comorbidity 2;Time since onset of injury/illness/exacerbation    Comorbidities active CA, OA    Examination-Activity Limitations Locomotion Level;Transfers;Bed Mobility;Sit;Caring for Others;Sleep;Squat;Stairs;Stand    Examination-Participation Restrictions Church;Meal Prep;Cleaning;Occupation;Driving    Stability/Clinical Decision Making Unstable/Unpredictable    Clinical Decision Making High    Rehab Potential Fair    PT Frequency 2x / week    PT Duration 6 weeks    PT Treatment/Interventions ADLs/Self Care Home Management;Cryotherapy;Gait training;Stair training;Functional mobility training;Therapeutic activities;Therapeutic exercise;Neuromuscular re-education;Manual techniques;Patient/family education;Balance training;Passive range of motion;Taping;Aquatic Therapy    PT Next Visit Plan begin aquatics when able; [place] strengthening & gross mobility    PT Home Exercise Plan [id number]    Consulted and Agree with Plan of Care Patient           Patient will benefit from skilled therapeutic intervention in order  to improve the following deficits and impairments:  Abnormal gait,Decreased range of motion,Difficulty walking,Increased muscle spasms,Decreased activity tolerance,Pain,Improper body mechanics,Decreased mobility,Decreased strength  Visit Diagnosis: Pain in right hip - Plan: PT plan of care cert/re-cert  Difficulty in walking, not elsewhere classified - Plan: PT plan of care cert/re-cert  Muscle weakness (generalized) - Plan: PT plan of care cert/re-cert     Problem List Patient Active Problem List   Diagnosis Date Noted  . Fall 11/29/2020  . Mild anxiety 11/28/2020  . Essential hypertension 11/16/2020  . Cancer associated pain 11/08/2020  . Vitamin D deficiency 10/27/2020  .  Pancytopenia, acquired ([place]) 09/12/2020  . Skin rash 08/17/2020  . Metastasis to liver ([place]) 06/14/2020  . Bone pain 05/27/2020  . Goals of care, counseling/discussion 05/16/2020  . Endometrial cancer ([place]) 10/22/2019  . Postmenopausal bleeding 09/02/2019  . Biliary colic 41/63/8453  . Breast pain, left 05/20/2019  . Trapezius muscle spasm 05/11/2019  . Elevated random blood glucose level 02/13/2019  . Epigastric pain 01/29/2019  . Prediabetes 03/18/2017  . Hypertension 03/18/2017  . Beta thalassemia trait 11/25/2013  . Class II obesity 11/19/2012  . Women's annual routine gynecological examination 07/12/2011  . Gastroesophageal reflux disease 01/28/2007  . Hyperlipidemia 10/10/2006    Ryla Cauthon C. Larrie Lucia PT, DPT 12/09/20 8:52 AM   [place] [facility] [address] [place], [place], [address] Phone: [phone]   Fax:  [phone]  Name: Giannina Bartolome MRN: [id number] Date of Birth: [birth year]

## 2020-12-13 ENCOUNTER — Encounter: Payer: Medicare Other | Admitting: Physical Therapy

## 2020-12-14 ENCOUNTER — Encounter: Payer: Self-pay | Admitting: Hematology and Oncology

## 2020-12-14 ENCOUNTER — Telehealth: Payer: Self-pay

## 2020-12-14 ENCOUNTER — Other Ambulatory Visit: Payer: Self-pay

## 2020-12-14 ENCOUNTER — Encounter: Payer: Self-pay | Admitting: Physical Therapy

## 2020-12-14 ENCOUNTER — Ambulatory Visit: Payer: Medicare Other | Admitting: Physical Therapy

## 2020-12-14 DIAGNOSIS — R262 Difficulty in walking, not elsewhere classified: Secondary | ICD-10-CM | POA: Diagnosis not present

## 2020-12-14 DIAGNOSIS — M25551 Pain in right hip: Secondary | ICD-10-CM

## 2020-12-14 DIAGNOSIS — M6281 Muscle weakness (generalized): Secondary | ICD-10-CM | POA: Diagnosis not present

## 2020-12-14 NOTE — Telephone Encounter (Signed)
-----   Message from [name], MD sent at 12/14/2020 10:04 AM EST ----- Regarding: can you call and ask how is her BP?

## 2020-12-14 NOTE — Therapy (Signed)
[place]ina Stone, [place], [address] Phone: [phone]   Fax:  [phone]  Physical Therapy Treatment  Patient Details  Name: Tina Stone MRN: [id number] Date of Birth: 05-07-1950 Referring Provider (PT): Isaac Bliss, Rayford Halsted, MD   Encounter Date: 12/14/2020   PT End of Session - 12/14/20 1149    Visit Number 2    Number of Visits 13    Date for PT Re-Evaluation 01/21/21    Authorization Type U[employer] MCR    Authorization Time Period FOTO visit 6, PN visit 10    PT Start Time 1145    PT Stop Time 1226    PT Time Calculation (min) 41 min    Activity Tolerance Patient tolerated treatment well    Behavior During Therapy WFL for tasks assessed/performed           Past Medical History:  Diagnosis Date  . Adenomatous colon polyp   . Allergic rhinitis, seasonal   . Allergy   . Beta thalassemia trait 11/25/2013  . Cholelithiasis   . Class 3 obesity without serious comorbidity with body mass index (BMI) of 40.0 to 44.9 in adult 11/19/2012  . endometrial ca dx'd 08/2009   endometrial   . GERD (gastroesophageal reflux disease)   . HLD (hyperlipidemia)   . Hypercholesterolemia   . Hypertension 03/18/2017   no meds   . Intraductal papilloma of left breast    Patient underwent left needle-localized lumpectomy by Dr. Imogene Burn. Tsuei on 09/09/2013; pathology showed intraductal papilloma with no atypia or malignancy identified.  Marland Kitchen PONV (postoperative nausea and vomiting)   . Pre-diabetes    pt denies  . Uterine fibroid     Past Surgical History:  Procedure Laterality Date  . [place]   right  . BREAST EXCISIONAL BIOPSY Left 2014   benign  . BREAST LUMPECTOMY WITH NEEDLE LOCALIZATION Left 09/09/2013   Procedure: BREAST LUMPECTOMY WITH NEEDLE LOCALIZATION;  Surgeon: Imogene Burn. Georgette Dover, MD;  Location: [facility];  Service: General;  Laterality: Left;  . CHOLECYSTECTOMY    . COLONOSCOPY     . IR IMAGING GUIDED PORT INSERTION  05/18/2020  . POLYPECTOMY    . ROBOTIC ASSISTED LAPAROSCOPIC CHOLECYSTECTOMY  09/09/2019  . ROBOTIC ASSISTED TOTAL HYSTERECTOMY WITH BILATERAL SALPINGO OOPHERECTOMY N/A 10/22/2019   Procedure: XI ROBOTIC ASSISTED TOTAL HYSTERECTOMY WITH BILATERAL SALPINGO OOPHORECTOMY GREATER THAN 250 GRAMS, MINI LAPARTOMY FOR SPECIMEN DELIVERY; PELVIC AND PERI-AORTIC LYMPHADENECTOMY;  Surgeon: Everitt Amber, MD;  Location: [facility];  Service: Gynecology;  Laterality: N/A;  . SENTINEL NODE BIOPSY N/A 10/22/2019   Procedure: SENTINEL NODE BIOPSY;  Surgeon: Everitt Amber, MD;  Location: [facility];  Service: Gynecology;  Laterality: N/A;    There were no vitals filed for this visit.   Subjective Assessment - 12/14/20 1148    Subjective I am feeling a little stronger. Night pain is getting better.    Patient Stated Goals decrease pain, improve mobility, swimming & walking    Currently in Pain? No/denies                             [facility] Adult PT Treatment/Exercise - 12/14/20 0001      Knee/Hip Exercises: Aerobic   Nustep 5 min L6 UE & LE      Knee/Hip Exercises: Standing   Gait Training toe off to knee flexion in swing through      Knee/Hip Exercises:  Supine   Other Supine Knee/Hip Exercises marching & progressed to marching to knee extension hovering table    Other Supine Knee/Hip Exercises bent knee fallout with core control x10 each      Knee/Hip Exercises: Sidelying   Clams both x15      Manual Therapy   Manual Therapy Soft tissue mobilization    Soft tissue mobilization Rt hip & hamstrings                    PT Short Term Goals - 12/09/20 0849      PT SHORT TERM GOAL #1   Title Pt will demo heel-toe gait pattern    Baseline flat foot strike with antalgic pattern at eval    Time 3    Period Weeks    Status New    Target Date 12/30/20      PT SHORT TERM GOAL #2   Title pt will verbalize ability to use HEP to improve daily  mobility of hip joint    Baseline began establishing at eval    Time 3    Period Weeks    Status New    Target Date 12/30/20             PT Long Term Goals - 12/09/20 0846      PT LONG TERM GOAL #1   Title pt will ambulate without antalgic gait pattern with use of LRAD    Baseline using SPC with significant antalgic pattern at eval    Time 6    Period Weeks    Status New    Target Date 01/21/21      PT LONG TERM GOAL #2   Title pain <=2/10 with ADLS    Baseline 9-6/22 normally reported at eval    Time 6    Period Weeks    Status New    Target Date 01/21/21      PT LONG TERM GOAL #3   Title pt will be independent in long term aquatics strengthening program to continue after d/c    Baseline has not been in the pool in about 6-8 months but would like to return    Time 6    Period Weeks    Status New    Target Date 01/21/21      PT LONG TERM GOAL #4   Title gross hip strength to 4+/5    Baseline 3-/5 at eval    Time 6    Period Weeks    Status New    Target Date 01/21/21                 Plan - 12/14/20 1238    Clinical Impression Statement Noted that she performs a cricumducted swing through with LLE resulting in lean to Rt side placing incr pressure on Rt leg. She reports Lt knee pain and stiffness as possible reason. we were able to decrease lateral lean with training of swing through- used single trekking pole today for balance assist but pt would prefer to not use any AD at this time.    PT Treatment/Interventions ADLs/Self Care Home Management;Cryotherapy;Gait training;Stair training;Functional mobility training;Therapeutic activities;Therapeutic exercise;Neuromuscular re-education;Manual techniques;Patient/family education;Balance training;Passive range of motion;Taping;Aquatic Therapy    PT Next Visit Plan cont gait training, gross hip strength    PT Home Exercise Plan [id number]    Consulted and Agree with Plan of Care Patient           Patient will  benefit from  skilled therapeutic intervention in order to improve the following deficits and impairments:  Abnormal gait,Decreased range of motion,Difficulty walking,Increased muscle spasms,Decreased activity tolerance,Pain,Improper body mechanics,Decreased mobility,Decreased strength  Visit Diagnosis: Pain in right hip  Difficulty in walking, not elsewhere classified  Muscle weakness (generalized)     Problem List Patient Active Problem List   Diagnosis Date Noted  . Fall 11/29/2020  . Mild anxiety 11/28/2020  . Essential hypertension 11/16/2020  . Cancer associated pain 11/08/2020  . Vitamin D deficiency 10/27/2020  . Pancytopenia, acquired ([facility]) 09/12/2020  . Skin rash 08/17/2020  . Metastasis to liver ([facility]) 06/14/2020  . Bone pain 05/27/2020  . Goals of care, counseling/discussion 05/16/2020  . Endometrial cancer ([facility]) 10/22/2019  . Postmenopausal bleeding 09/02/2019  . Biliary colic 70/78/6754  . Breast pain, left 05/20/2019  . Trapezius muscle spasm 05/11/2019  . Elevated random blood glucose level 02/13/2019  . Epigastric pain 01/29/2019  . Prediabetes 03/18/2017  . Hypertension 03/18/2017  . Beta thalassemia trait 11/25/2013  . Class II obesity 11/19/2012  . Women's annual routine gynecological examination 07/12/2011  . Gastroesophageal reflux disease 01/28/2007  . Hyperlipidemia 10/10/2006   Borden Thune C. Marceil Welp PT, DPT 12/14/20 12:43 PM   [facility] [address] [place], [place], [address] Phone: [phone]   Fax:  [phone]  Name: Quiara Killian MRN: [id number] Date of Birth: [birth year]

## 2020-12-14 NOTE — Telephone Encounter (Signed)
She called and left a message to call her.  Called back. She is been having hoarseness,  for 2-3 weeks. Denies fever or sore throat. She is drinking lots of fluids and using cough drops. She is not sure if it from her heat or allergies. She is questioning if it is a side effect of medication. Instructed to try saline nasal spray and she could try OTC Claritin.

## 2020-12-14 NOTE — Telephone Encounter (Signed)
Called and given below message. She verbalzied understanding. She is not at home. She will call back with bp readings.

## 2020-12-15 MED FILL — CHLORTHALIDONE 25 MG TABS: 25 | 60 days supply | Qty: 30 | Fill #0

## 2020-12-15 NOTE — Telephone Encounter (Signed)
Called and left below message. Ask her to call the office back. 

## 2020-12-15 NOTE — Telephone Encounter (Signed)
It is not due to side-effects of chemo If she wants we can refer to ENT

## 2020-12-19 ENCOUNTER — Encounter (HOSPITAL_COMMUNITY): Payer: Self-pay

## 2020-12-19 ENCOUNTER — Emergency Department (HOSPITAL_COMMUNITY)
Admission: EM | Admit: 2020-12-19 | Discharge: 2020-12-19 | Disposition: A | Discharge status: Home / Self Care | Payer: Medicare Other | Attending: Emergency Medicine | Admitting: Emergency Medicine

## 2020-12-19 ENCOUNTER — Emergency Department (HOSPITAL_COMMUNITY): Payer: Medicare Other

## 2020-12-19 ENCOUNTER — Other Ambulatory Visit: Payer: Self-pay

## 2020-12-19 DIAGNOSIS — R0600 Dyspnea, unspecified: Secondary | ICD-10-CM | POA: Insufficient documentation

## 2020-12-19 DIAGNOSIS — Z79899 Other long term (current) drug therapy: Secondary | ICD-10-CM | POA: Diagnosis not present

## 2020-12-19 DIAGNOSIS — E876 Hypokalemia: Secondary | ICD-10-CM | POA: Diagnosis not present

## 2020-12-19 DIAGNOSIS — Z87891 Personal history of nicotine dependence: Secondary | ICD-10-CM | POA: Insufficient documentation

## 2020-12-19 DIAGNOSIS — Z7982 Long term (current) use of aspirin: Secondary | ICD-10-CM | POA: Diagnosis not present

## 2020-12-19 DIAGNOSIS — Z8542 Personal history of malignant neoplasm of other parts of uterus: Secondary | ICD-10-CM | POA: Insufficient documentation

## 2020-12-19 DIAGNOSIS — R0602 Shortness of breath: Secondary | ICD-10-CM | POA: Diagnosis not present

## 2020-12-19 DIAGNOSIS — I10 Essential (primary) hypertension: Secondary | ICD-10-CM | POA: Insufficient documentation

## 2020-12-19 LAB — CBC
HCT: 40 % (ref 36.0–46.0)
Hemoglobin: 12.9 g/dL (ref 12.0–15.0)
MCH: 24.5 pg — ABNORMAL LOW (ref 26.0–34.0)
MCHC: 32.3 g/dL (ref 30.0–36.0)
MCV: 75.9 fL — ABNORMAL LOW (ref 80.0–100.0)
Platelets: 194 10*3/uL (ref 150–400)
RBC: 5.27 MIL/uL — ABNORMAL HIGH (ref 3.87–5.11)
RDW: 17.2 % — ABNORMAL HIGH (ref 11.5–15.5)
WBC: 4.1 10*3/uL (ref 4.0–10.5)
nRBC: 0 % (ref 0.0–0.2)

## 2020-12-19 LAB — BASIC METABOLIC PANEL
Anion gap: 13 (ref 5–15)
BUN: 17 mg/dL (ref 8–23)
CO2: 23 mmol/L (ref 22–32)
Calcium: 8.9 mg/dL (ref 8.9–10.3)
Chloride: 99 mmol/L (ref 98–111)
Creatinine, Ser: 0.83 mg/dL (ref 0.44–1.00)
GFR, Estimated: >60 mL/min (ref >60)
Glucose, Bld: 140 mg/dL — ABNORMAL HIGH (ref 70–99)
Potassium: 2.9 mmol/L — ABNORMAL LOW (ref 3.5–5.1)
Sodium: 135 mmol/L (ref 135–145)

## 2020-12-19 MED ORDER — POTASSIUM CHLORIDE CRYS ER 20 MEQ PO TBCR
40.0000 meq | EXTENDED_RELEASE_TABLET | Freq: Once | ORAL | Status: AC
  Administered 2020-12-19: 40 meq via ORAL
  Filled 2020-12-19: qty 2

## 2020-12-19 MED ORDER — POTASSIUM CHLORIDE CRYS ER 20 MEQ PO TBCR: 40.0000 meq | EXTENDED_RELEASE_TABLET | Freq: Two times a day (BID) | ORAL | 0 refills | Status: DC

## 2020-12-19 NOTE — ED Provider Notes (Signed)
[facility] DEPT Provider Note   CSN: [id number] Arrival date & time: 12/19/20  2130     History Chief Complaint  Patient presents with  . Shortness of Breath    Tina Stone is a 70 y.o. female.  70 year old female who presents for shortness of breath.  Patient states that she was in her usual state of health and had a large meal.  She went to lay down felt some indigestion type symptoms.  She then started to get worried and then started having shortness of breath.  She got more worried so she woke up and took her blood pressure and it was little bit elevated around 696 systolic and she got more worried checked it again it was in the 170s so she presents here for further evaluation.  She states that now that her blood pressure has been rechecked and its closer to normal she is not as short of breath anymore.  She relates this may be related to anxiety.  She is ready to go home. No chest pain, back pain, lower extremity swelling, cough, fever or other related symptoms.  She has not tried any for the symptoms.  No recent illnesses.  No sick contacts.   Shortness of Breath      Past Medical History:  Diagnosis Date  . Adenomatous colon polyp   . Allergic rhinitis, seasonal   . Allergy   . Beta thalassemia trait 11/25/2013  . Cholelithiasis   . Class 3 obesity without serious comorbidity with body mass index (BMI) of 40.0 to 44.9 in adult 11/19/2012  . endometrial ca dx'd 08/2009   endometrial   . GERD (gastroesophageal reflux disease)   . HLD (hyperlipidemia)   . Hypercholesterolemia   . Hypertension 03/18/2017   no meds   . Intraductal papilloma of left breast    Patient underwent left needle-localized lumpectomy by Dr. Imogene Burn. Tsuei on 09/09/2013; pathology showed intraductal papilloma with no atypia or malignancy identified.  Marland Kitchen PONV (postoperative nausea and vomiting)   . Pre-diabetes    pt denies  . Uterine fibroid     Patient  Active Problem List   Diagnosis Date Noted  . Fall 11/29/2020  . Mild anxiety 11/28/2020  . Essential hypertension 11/16/2020  . Cancer associated pain 11/08/2020  . Vitamin D deficiency 10/27/2020  . Pancytopenia, acquired ([place]) 09/12/2020  . Skin rash 08/17/2020  . Metastasis to liver ([facility]) 06/14/2020  . Bone pain 05/27/2020  . Goals of care, counseling/discussion 05/16/2020  . Endometrial cancer ([facility]) 10/22/2019  . Postmenopausal bleeding 09/02/2019  . Biliary colic 29/52/8413  . Breast pain, left 05/20/2019  . Trapezius muscle spasm 05/11/2019  . Elevated random blood glucose level 02/13/2019  . Epigastric pain 01/29/2019  . Prediabetes 03/18/2017  . Hypertension 03/18/2017  . Beta thalassemia trait 11/25/2013  . Class II obesity 11/19/2012  . Women's annual routine gynecological examination 07/12/2011  . Gastroesophageal reflux disease 01/28/2007  . Hyperlipidemia 10/10/2006    Past Surgical History:  Procedure Laterality Date  . [place]   right  . BREAST EXCISIONAL BIOPSY Left 2014   benign  . BREAST LUMPECTOMY WITH NEEDLE LOCALIZATION Left 09/09/2013   Procedure: BREAST LUMPECTOMY WITH NEEDLE LOCALIZATION;  Surgeon: Imogene Burn. Georgette Dover, MD;  Location: [facility];  Service: General;  Laterality: Left;  . CHOLECYSTECTOMY    . COLONOSCOPY    . IR IMAGING GUIDED PORT INSERTION  05/18/2020  . POLYPECTOMY    . ROBOTIC ASSISTED  LAPAROSCOPIC CHOLECYSTECTOMY  09/09/2019  . ROBOTIC ASSISTED TOTAL HYSTERECTOMY WITH BILATERAL SALPINGO OOPHERECTOMY N/A 10/22/2019   Procedure: XI ROBOTIC ASSISTED TOTAL HYSTERECTOMY WITH BILATERAL SALPINGO OOPHORECTOMY GREATER THAN 250 GRAMS, MINI LAPARTOMY FOR SPECIMEN DELIVERY; PELVIC AND PERI-AORTIC LYMPHADENECTOMY;  Surgeon: Everitt Amber, MD;  Location[facility];  Service: Gynecology;  Laterality: N/A;  . SENTINEL NODE BIOPSY N/A 10/22/2019   Procedure: SENTINEL NODE BIOPSY;  Surgeon: Everitt Amber, MD;  Location[facility];  Service:  Gynecology;  Laterality: N/A;     OB History    Gravida  1   Para  0   Term  0   Preterm  0   AB  1   Living  0     SAB  0   IAB  1   Ectopic  0   Multiple  0   Live Births  0           Family History  Problem Relation Age of Onset  . Diabetes Mother   . Hypertension Mother   . Colon polyps Mother 58  . Dementia Mother 51  . Diabetes Father   . Congestive Heart Failure Father   . Pancreatic cancer Paternal Aunt   . Colon cancer Neg Hx   . Breast cancer Neg Hx   . Lung cancer Neg Hx   . Esophageal cancer Neg Hx   . Rectal cancer Neg Hx   . Stomach cancer Neg Hx     Social History   Tobacco Use  . Smoking status: Never Smoker  . Smokeless tobacco: Never Used  . Tobacco comment: few puffs but not a true smoker quit many yrs ago  Vaping Use  . Vaping Use: Never used  Substance Use Topics  . Alcohol use: Never  . Drug use: Never    Home Medications Prior to Admission medications   Medication Sig Start Date End Date Taking? Authorizing Provider  acetaminophen (TYLENOL) 500 MG tablet Take 1,000 mg by mouth every 6 (six) hours as needed for moderate pain.    [provider]  ALPRAZolam Duanne Moron) 0.25 MG tablet Take 1 tablet (0.25 mg total) by mouth 2 (two) times daily as needed for anxiety. 11/28/20   [name], MD  APPLE CIDER VINEGAR PO Take 5 mLs by mouth daily.     [provider]  aspirin EC 81 MG tablet Take 81 mg by mouth daily. Swallow whole.    [provider]  atenolol (TENORMIN) 25 MG tablet Take 1 tablet (25 mg total) by mouth daily. 11/28/20   [name], MD  b complex vitamins tablet Take 1 tablet by mouth daily.    [provider]  Blood Pressure Monitoring (BLOOD PRESSURE KIT) DEVI 1 Units by Does not apply route every morning. 07/01/20   Raylene Everts, MD  calcium-vitamin D (OSCAL WITH D) 500-200 MG-UNIT tablet Take 1 tablet by mouth daily with breakfast.     [provider]   diphenhydrAMINE (BENADRYL) 25 MG tablet Take 25 mg by mouth every 6 (six) hours as needed for allergies.    [provider]  famotidine (PEPCID) 20 MG tablet Take 20 mg by mouth 2 (two) times daily as needed for heartburn or indigestion.    [provider]  hydrochlorothiazide (HYDRODIURIL) 25 MG tablet Take 1 tablet (25 mg total) by mouth daily. 12/07/20   [name], MD  lenvatinib 10 mg daily dose (LENVIMA, 10 MG DAILY DOSE,) capsule Take 1 capsule (10 mg total) by  mouth daily. 11/08/20   [name], MD  loratadine (CLARITIN) 10 MG tablet Take 10 mg by mouth daily as needed for allergies.    [provider]  Multiple Vitamin (MULTIVITAMIN) capsule Take 1 capsule by mouth daily.    [provider]  ondansetron (ZOFRAN) 8 MG tablet Take 1 tablet (8 mg total) by mouth every 8 (eight) hours as needed. Start on the third day after chemotherapy. 05/16/20   [name], MD  oxyCODONE (OXY IR/ROXICODONE) 5 MG immediate release tablet Take 1 tablet (5 mg total) by mouth every 4 (four) hours as needed for severe pain. 11/08/20   [name], MD  polyethylene glycol (MIRALAX / GLYCOLAX) 17 g packet Take 17 g by mouth daily as needed for mild constipation.    [provider]  potassium chloride SA (KLOR-CON) 20 MEQ tablet Take 2 tablets (40 mEq total) by mouth 2 (two) times daily for 7 days. 12/19/20 12/26/20  Latara Micheli, Corene Cornea, MD  prochlorperazine (COMPAZINE) 10 MG tablet Take 1 tablet (10 mg total) by mouth every 6 (six) hours as needed (Nausea or vomiting). 05/16/20   [name], MD  Vitamin D, Ergocalciferol, (DRISDOL) 1.25 MG (50000 UNIT) CAPS capsule Take 1 capsule (50,000 Units total) by mouth every 7 (seven) days for 12 doses. 10/27/20 01/13/21  Isaac Bliss, Rayford Halsted, MD  atorvastatin (LIPITOR) 20 MG tablet Take 1 tablet (20 mg total) by mouth daily. 05/03/20 07/01/20  Isaac Bliss, Rayford Halsted, MD    Allergies    Benicar hct [olmesartan medoxomil-hctz],  Bactrim [sulfamethoxazole-trimethoprim], Pravastatin, Shellfish allergy, Amlodipine, and Iodinated diagnostic agents  Review of Systems   Review of Systems  Respiratory: Positive for shortness of breath.   All other systems reviewed and are negative.   Physical Exam Updated Vital Signs BP 129/79 (BP Location: Left Arm)   Pulse (!) 57   Temp 97.9 F (36.6 C) (Oral)   Resp 16   SpO2 100%   Physical Exam Vitals and nursing note reviewed.  Constitutional:      Appearance: She is well-developed and well-nourished.  HENT:     Head: Normocephalic and atraumatic.  Eyes:     Pupils: Pupils are equal, round, and reactive to light.  Cardiovascular:     Rate and Rhythm: Normal rate and regular rhythm.  Pulmonary:     Effort: No respiratory distress.     Breath sounds: No stridor. No decreased breath sounds, wheezing, rhonchi or rales.  Abdominal:     General: There is no distension.  Musculoskeletal:        General: Normal range of motion.     Cervical back: Normal range of motion.     Right lower leg: No edema.     Left lower leg: No edema.  Skin:    General: Skin is warm and dry.  Neurological:     General: No focal deficit present.     Mental Status: She is alert.     ED Results / Procedures / Treatments   Labs (all labs ordered are listed, but only abnormal results are displayed) Labs Reviewed  BASIC METABOLIC PANEL - Abnormal; Notable for the following components:      Result Value   Potassium 2.9 (*)    Glucose, Bld 140 (*)    All other components within normal limits  CBC - Abnormal; Notable for the following components:   RBC 5.27 (*)    MCV 75.9 (*)    MCH 24.5 (*)    RDW  17.2 (*)    All other components within normal limits    EKG None  Radiology DG Chest 2 View  Result Date: 12/19/2020 CLINICAL DATA:  Shortness of breath. EXAM: CHEST - 2 VIEW COMPARISON:  November 25, 2020 FINDINGS: There is stable right-sided venous Port-A-Cath positioning. The  heart size and mediastinal contours are within normal limits. Both lungs are clear. Surgical clips are again seen overlying the left lung base. The visualized skeletal structures are unremarkable. IMPRESSION: No active cardiopulmonary disease. Electronically Signed   By: Virgina Norfolk M.D.   On: 12/19/2020 21:59    Procedures Procedures (including critical care time)  Medications Ordered in ED Medications  potassium chloride SA (KLOR-CON) CR tablet 40 mEq (has no administration in time range)    ED Course  I have reviewed the triage vital signs and the nursing notes.  Pertinent labs & imaging results that were available during my care of the patient were reviewed by me and considered in my medical decision making (see chart for details).    MDM Rules/Calculators/A&P                          Overall appears well.  Suspect that she had some indigestion or heartburn and became a little bit anxious about it and thus the rest of her symptoms.  When she is reassured here blood pressure is 129/79.  Her work-up was relatively normal aside for some mild hypokalemia without any evidence of EKG changes.  Doubt PE, pneumonia, ACS or other emergent causes for symptoms at this time we will start to replete her potassium here and have her go home on for about a week.  Follow with PCP for recheck of all the above symptoms.  Final Clinical Impression(s) / ED Diagnoses Final diagnoses:  Dyspnea, unspecified type  Hypokalemia    Rx / DC Orders ED Discharge Orders         Ordered    potassium chloride SA (KLOR-CON) 20 MEQ tablet  2 times daily        12/19/20 2240           Tao Satz, Corene Cornea, MD 12/19/20 2244

## 2020-12-19 NOTE — ED Notes (Signed)
After vital signs, patient states that now that she knows her blood pressure has decreased her shortness of breath has resolved. Patient asked if she can go home now, this nurse encouraged patient to stay and be seen by a provider.

## 2020-12-19 NOTE — ED Triage Notes (Signed)
Patient arrived stating that she started having shortness of breath and mid back pain that started tonight. Declines any chest pain.

## 2020-12-21 ENCOUNTER — Encounter: Payer: Self-pay | Admitting: Physical Therapy

## 2020-12-21 ENCOUNTER — Ambulatory Visit: Payer: Medicare Other | Admitting: Physical Therapy

## 2020-12-21 ENCOUNTER — Other Ambulatory Visit: Payer: Self-pay

## 2020-12-21 DIAGNOSIS — R262 Difficulty in walking, not elsewhere classified: Secondary | ICD-10-CM

## 2020-12-21 DIAGNOSIS — M6281 Muscle weakness (generalized): Secondary | ICD-10-CM

## 2020-12-21 DIAGNOSIS — M25551 Pain in right hip: Secondary | ICD-10-CM | POA: Diagnosis not present

## 2020-12-21 NOTE — Therapy (Signed)
[facility] [place], [place], [address] Phone: [phone]   Fax:  [phone]  Physical Therapy Treatment  Patient Details  Name: Tina Stone MRN: [id number] Date of Birth: 10-08-1950 Referring Provider (PT): Isaac Bliss, Rayford Halsted, MD   Encounter Date: 12/21/2020   PT End of Session - 12/21/20 1148    Visit Number 3    Number of Visits 13    Date for PT Re-Evaluation 01/21/21    Authorization Type UHC MCR    Authorization Time Period FOTO visit 6, PN visit 10    PT Start Time 1145    PT Stop Time 1226    PT Time Calculation (min) 41 min    Activity Tolerance Patient tolerated treatment well    Behavior During Therapy WFL for tasks assessed/performed           Past Medical History:  Diagnosis Date  . Adenomatous colon polyp   . Allergic rhinitis, seasonal   . Allergy   . Beta thalassemia trait 11/25/2013  . Cholelithiasis   . Class 3 obesity without serious comorbidity with body mass index (BMI) of 40.0 to 44.9 in adult 11/19/2012  . endometrial ca dx'd 08/2009   endometrial   . GERD (gastroesophageal reflux disease)   . HLD (hyperlipidemia)   . Hypercholesterolemia   . Hypertension 03/18/2017   no meds   . Intraductal papilloma of left breast    Patient underwent left needle-localized lumpectomy by Dr. Imogene Burn. [place] on 09/09/2013; pathology showed intraductal papilloma with no atypia or malignancy identified.  Marland Kitchen PONV (postoperative nausea and vomiting)   . Pre-diabetes    pt denies  . Uterine fibroid     Past Surgical History:  Procedure Laterality Date  . [place]   right  . BREAST EXCISIONAL BIOPSY Left 2014   benign  . BREAST LUMPECTOMY WITH NEEDLE LOCALIZATION Left 09/09/2013   Procedure: BREAST LUMPECTOMY WITH NEEDLE LOCALIZATION;  Surgeon: Imogene Burn. Georgette Dover, MD;  Location: [facility];  Service: General;  Laterality: Left;  . CHOLECYSTECTOMY    . COLONOSCOPY     . IR IMAGING GUIDED PORT INSERTION  05/18/2020  . POLYPECTOMY    . ROBOTIC ASSISTED LAPAROSCOPIC CHOLECYSTECTOMY  09/09/2019  . ROBOTIC ASSISTED TOTAL HYSTERECTOMY WITH BILATERAL SALPINGO OOPHERECTOMY N/A 10/22/2019   Procedure: XI ROBOTIC ASSISTED TOTAL HYSTERECTOMY WITH BILATERAL SALPINGO OOPHORECTOMY GREATER THAN 250 GRAMS, MINI LAPARTOMY FOR SPECIMEN DELIVERY; PELVIC AND PERI-AORTIC LYMPHADENECTOMY;  Surgeon: Everitt Amber, MD;  Location: [facility];  Service: Gynecology;  Laterality: N/A;  . SENTINEL NODE BIOPSY N/A 10/22/2019   Procedure: SENTINEL NODE BIOPSY;  Surgeon: Everitt Amber, MD;  Location: [facility];  Service: Gynecology;  Laterality: N/A;    There were no vitals filed for this visit.   Subjective Assessment - 12/21/20 1149    Subjective Hip is feeling a lot stronger.              [facility] PT Assessment - 12/21/20 0001      Ambulation/Gait   Gait Comments no AD, demo rolling of toes at toe off but still hikes left hip                         [facility] Adult PT Treatment/Exercise - 12/21/20 0001      Knee/Hip Exercises: Aerobic   Nustep 5 min L6 UE & LE      Knee/Hip Exercises: Standing   Rocker Board Limitations  lateral- trying to balance    Gait Training toe off, knee flexion, trunk rotation    Other Standing Knee Exercises wide tandem with opp UE flexion- resisted by red tband      Knee/Hip Exercises: Seated   Long Arc Quad Both;2 sets;10 reps    Long Arc Quad Limitations ball bw knees      Knee/Hip Exercises: Sidelying   Hip ABduction Limitations both x15    Clams both x15      Manual Therapy   Soft tissue mobilization STM Rt hip                    PT Short Term Goals - 12/09/20 0849      PT SHORT TERM GOAL #1   Title Pt will demo heel-toe gait pattern    Baseline flat foot strike with antalgic pattern at eval    Time 3    Period Weeks    Status New    Target Date 12/30/20      PT SHORT TERM GOAL #2   Title pt will verbalize  ability to use HEP to improve daily mobility of hip joint    Baseline began establishing at eval    Time 3    Period Weeks    Status New    Target Date 12/30/20             PT Long Term Goals - 12/09/20 0846      PT LONG TERM GOAL #1   Title pt will ambulate without antalgic gait pattern with use of LRAD    Baseline using SPC with significant antalgic pattern at eval    Time 6    Period Weeks    Status New    Target Date 01/21/21      PT LONG TERM GOAL #2   Title pain <=2/10 with ADLS    Baseline 9-6/29 normally reported at eval    Time 6    Period Weeks    Status New    Target Date 01/21/21      PT LONG TERM GOAL #3   Title pt will be independent in long term aquatics strengthening program to continue after d/c    Baseline has not been in the pool in about 6-8 months but would like to return    Time 6    Period Weeks    Status New    Target Date 01/21/21      PT LONG TERM GOAL #4   Title gross hip strength to 4+/5    Baseline 3-/5 at eval    Time 6    Period Weeks    Status New    Target Date 01/21/21                 Plan - 12/21/20 1226    Clinical Impression Statement Tactile cues provided in gait training to improve trunk rotation. Pt was able to demo significatn decrease in lateral trunk lean and felt that pressure was decreased in hip while walking. Very little range available in sidelying to lift against gravity due to lack of strength. Overall feeling a decrease in general pain.    PT Treatment/Interventions ADLs/Self Care Home Management;Cryotherapy;Gait training;Stair training;Functional mobility training;Therapeutic activities;Therapeutic exercise;Neuromuscular re-education;Manual techniques;Patient/family education;Balance training;Passive range of motion;Taping;Aquatic Therapy    PT Next Visit Plan cont gait training, gross hip strength    PT Home Exercise Plan [id number]    [id number] and Agree with Plan of Care Patient  Patient  will benefit from skilled therapeutic intervention in order to improve the following deficits and impairments:  Abnormal gait,Decreased range of motion,Difficulty walking,Increased muscle spasms,Decreased activity tolerance,Pain,Improper body mechanics,Decreased mobility,Decreased strength  Visit Diagnosis: Pain in right hip  Difficulty in walking, not elsewhere classified  Muscle weakness (generalized)     Problem List Patient Active Problem List   Diagnosis Date Noted  . Fall 11/29/2020  . Mild anxiety 11/28/2020  . Essential hypertension 11/16/2020  . Cancer associated pain 11/08/2020  . Vitamin D deficiency 10/27/2020  . Pancytopenia, acquired ([place]) 09/12/2020  . Skin rash 08/17/2020  . Metastasis to liver ([place]) 06/14/2020  . Bone pain 05/27/2020  . Goals of care, counseling/discussion 05/16/2020  . Endometrial cancer ([place]) 10/22/2019  . Postmenopausal bleeding 09/02/2019  . Biliary colic [id number]  . Breast pain, left 05/20/2019  . Trapezius muscle spasm 05/11/2019  . Elevated random blood glucose level 02/13/2019  . Epigastric pain 01/29/2019  . Prediabetes 03/18/2017  . Hypertension 03/18/2017  . Beta thalassemia trait 11/25/2013  . Class II obesity 11/19/2012  . Women's annual routine gynecological examination 07/12/2011  . Gastroesophageal reflux disease 01/28/2007  . Hyperlipidemia 10/10/2006    Tina Stone PT, DPT 12/21/20 12:28 PM   [facility] [address] [place], [place], [address] Phone: [phone]   Fax:  [phone]  Name: Tina Stone MRN: [id number] Date of Birth: 07/14/1950

## 2020-12-28 MED FILL — ATENOLOL 25 MG TABLET: 25 | 30 days supply | Qty: 30 | Fill #1

## 2021-01-02 ENCOUNTER — Ambulatory Visit: Payer: Medicare Other

## 2021-01-04 ENCOUNTER — Other Ambulatory Visit: Payer: Self-pay | Admitting: *Deleted

## 2021-01-04 ENCOUNTER — Other Ambulatory Visit: Payer: Self-pay

## 2021-01-04 ENCOUNTER — Inpatient Hospital Stay: Payer: Medicare Other | Attending: Gynecologic Oncology

## 2021-01-04 ENCOUNTER — Inpatient Hospital Stay: Payer: Medicare Other

## 2021-01-04 ENCOUNTER — Ambulatory Visit: Payer: Medicare Other | Admitting: Physical Therapy

## 2021-01-04 ENCOUNTER — Encounter: Payer: Self-pay | Admitting: Hematology and Oncology

## 2021-01-04 ENCOUNTER — Inpatient Hospital Stay (HOSPITAL_BASED_OUTPATIENT_CLINIC_OR_DEPARTMENT_OTHER): Payer: Medicare Other | Admitting: Hematology and Oncology

## 2021-01-04 VITALS — BP 147/86 | HR 64 | Temp 98.2°F | Resp 17 | Ht 64.0 in | Wt 193.2 lb

## 2021-01-04 DIAGNOSIS — Z7189 Other specified counseling: Secondary | ICD-10-CM

## 2021-01-04 DIAGNOSIS — C541 Malignant neoplasm of endometrium: Secondary | ICD-10-CM

## 2021-01-04 DIAGNOSIS — Z5111 Encounter for antineoplastic chemotherapy: Secondary | ICD-10-CM | POA: Insufficient documentation

## 2021-01-04 DIAGNOSIS — I10 Essential (primary) hypertension: Secondary | ICD-10-CM | POA: Diagnosis not present

## 2021-01-04 DIAGNOSIS — D563 Thalassemia minor: Secondary | ICD-10-CM

## 2021-01-04 DIAGNOSIS — G893 Neoplasm related pain (acute) (chronic): Secondary | ICD-10-CM | POA: Insufficient documentation

## 2021-01-04 DIAGNOSIS — Z79899 Other long term (current) drug therapy: Secondary | ICD-10-CM | POA: Diagnosis not present

## 2021-01-04 LAB — CBC WITH DIFFERENTIAL (CANCER CENTER ONLY)
Abs Immature Granulocytes: 0.01 10*3/uL (ref 0.00–0.07)
Basophils Absolute: 0 10*3/uL (ref 0.0–0.1)
Basophils Relative: 1 %
Eosinophils Absolute: 0.1 10*3/uL (ref 0.0–0.5)
Eosinophils Relative: 2 %
HCT: 39.9 % (ref 36.0–46.0)
Hemoglobin: 12.6 g/dL (ref 12.0–15.0)
Immature Granulocytes: 0 %
Lymphocytes Relative: 33 %
Lymphs Abs: 1.3 10*3/uL (ref 0.7–4.0)
MCH: 23.7 pg — ABNORMAL LOW (ref 26.0–34.0)
MCHC: 31.6 g/dL (ref 30.0–36.0)
MCV: 75.1 fL — ABNORMAL LOW (ref 80.0–100.0)
Monocytes Absolute: 0.3 10*3/uL (ref 0.1–1.0)
Monocytes Relative: 8 %
Neutro Abs: 2.3 10*3/uL (ref 1.7–7.7)
Neutrophils Relative %: 56 %
Platelet Count: 188 10*3/uL (ref 150–400)
RBC: 5.31 MIL/uL — ABNORMAL HIGH (ref 3.87–5.11)
RDW: 17.4 % — ABNORMAL HIGH (ref 11.5–15.5)
WBC Count: 4 10*3/uL (ref 4.0–10.5)
nRBC: 0 % (ref 0.0–0.2)

## 2021-01-04 LAB — CMP (CANCER CENTER ONLY)
ALT: 13 U/L (ref 0–44)
AST: 17 U/L (ref 15–41)
Albumin: 3.5 g/dL (ref 3.5–5.0)
Alkaline Phosphatase: 90 U/L (ref 38–126)
Anion gap: 10 (ref 5–15)
BUN: 14 mg/dL (ref 8–23)
CO2: 28 mmol/L (ref 22–32)
Calcium: 9.2 mg/dL (ref 8.9–10.3)
Chloride: 100 mmol/L (ref 98–111)
Creatinine: 0.87 mg/dL (ref 0.44–1.00)
GFR, Estimated: >60 mL/min (ref >60)
Glucose, Bld: 108 mg/dL — ABNORMAL HIGH (ref 70–99)
Potassium: 3.5 mmol/L (ref 3.5–5.1)
Sodium: 138 mmol/L (ref 135–145)
Total Bilirubin: 0.6 mg/dL (ref 0.3–1.2)
Total Protein: 7.4 g/dL (ref 6.5–8.1)

## 2021-01-04 LAB — TSH: TSH: 0.71 u[IU]/mL (ref 0.308–3.960)

## 2021-01-04 LAB — TOTAL PROTEIN, URINE DIPSTICK

## 2021-01-04 MED ORDER — SODIUM CHLORIDE 0.9% FLUSH
10.0000 mL | INTRAVENOUS | Status: DC | PRN
  Administered 2021-01-04: 10 mL
  Filled 2021-01-04: qty 10

## 2021-01-04 MED ORDER — SODIUM CHLORIDE 0.9 % IV SOLN
200.0000 mg | Freq: Once | INTRAVENOUS | Status: AC
  Administered 2021-01-04: 200 mg via INTRAVENOUS
  Filled 2021-01-04: qty 8

## 2021-01-04 MED ORDER — HYDROCHLOROTHIAZIDE 25 MG PO TABS: 12.5000 mg | ORAL_TABLET | Freq: Every day | ORAL | 1 refills | Status: DC

## 2021-01-04 MED ORDER — LISINOPRIL 5 MG PO TABS: 5.0000 mg | ORAL_TABLET | Freq: Every day | ORAL | 11 refills | Status: DC

## 2021-01-04 MED ORDER — HEPARIN SOD (PORK) LOCK FLUSH 100 UNIT/ML IV SOLN
500.0000 [IU] | Freq: Once | INTRAVENOUS | Status: AC | PRN
  Administered 2021-01-04: 500 [IU]
  Filled 2021-01-04: qty 5

## 2021-01-04 MED ORDER — SODIUM CHLORIDE 0.9 % IV SOLN
Freq: Once | INTRAVENOUS | Status: AC
  Filled 2021-01-04: qty 250

## 2021-01-04 NOTE — Patient Instructions (Signed)
[facility] Discharge Instructions for Patients Receiving Chemotherapy  Today you received the following chemotherapy agents:  Keytruda.  To help prevent nausea and vomiting after your treatment, we encourage you to take your nausea medication as directed.   If you develop nausea and vomiting that is not controlled by your nausea medication, call the clinic.   BELOW ARE SYMPTOMS THAT SHOULD BE REPORTED IMMEDIATELY:  *FEVER GREATER THAN 100.5 F  *CHILLS WITH OR WITHOUT FEVER  NAUSEA AND VOMITING THAT IS NOT CONTROLLED WITH YOUR NAUSEA MEDICATION  *UNUSUAL SHORTNESS OF BREATH  *UNUSUAL BRUISING OR BLEEDING  TENDERNESS IN MOUTH AND THROAT WITH OR WITHOUT PRESENCE OF ULCERS  *URINARY PROBLEMS  *BOWEL PROBLEMS  UNUSUAL RASH Items with * indicate a potential emergency and should be followed up as soon as possible.  Feel free to call the clinic should you have any questions or concerns. The clinic phone number is [phone].  Please show the CHEMO ALERT CARD at check-in to the Emergency Department and triage nurse.    

## 2021-01-04 NOTE — Telephone Encounter (Signed)
[facility]  Patient is requesting a refill of  chlorthalidone (HYGROTON) 25 MG tablet  Has not been filled by Dr Ardyth Harps.  Okay to fill?

## 2021-01-04 NOTE — Progress Notes (Signed)
[facility] OFFICE PROGRESS NOTE  Patient Care Team: Isaac Bliss, Rayford Halsted, MD as PCP - General (Internal Medicine) Axel Filler, MD  ASSESSMENT & PLAN:  Endometrial cancer [facility]) She is doing well today Her blood pressure control has improved but I think she could benefit from medication adjustment We discussed minimum 4 cycles of treatment before repeating imaging study We will continue treatment as scheduled  Essential hypertension Her blood pressure is still suboptimally controlled She has severe hypokalemia recently I recommend reducing hydrochlorothiazide to half and to introduce lisinopril I explained to the patient the rationale of changing her blood pressure medications We will call her again in a few days to check on her blood pressure management  Beta thalassemia trait I reviewed CBC with the patient and explained the abnormal CBC is due to thalassemia trait   No orders of the defined types were placed in this encounter.   All questions were answered. The patient knows to call the clinic with any problems, questions or concerns. The total time spent in the appointment was 20 minutes encounter with patients including review of chart and various tests results, discussions about plan of care and coordination of care plan   [name], MD 01/04/2021 11:59 AM  INTERVAL HISTORY: Please see below for problem oriented charting. She returns for chemotherapy and follow-up She appears anxious Her documented blood pressure is better but still suboptimally controlled She was prescribed potassium supplement recently due to low potassium level She have occasional rare muscles aches but overall tolerated treatment better  SUMMARY OF ONCOLOGIC HISTORY: Oncology History Overview Note  Poorly differentiated carcinoma, mixed histology with squamous differentiation, rare focus of clear cells as well as serous features MSI stable Her2 negative    Endometrial cancer ([place])  09/01/2019 Initial Diagnosis   The patient reported a history of postmenopausal bleeding that began 1 to 2 months before diagnosis   09/14/2019 Imaging   US pelvis 1. Enlarged uterus with numerous myometrial masses, presumably fibroids. 2. Endometrial thickness of 6.2 mm. In the setting of post-menopausal bleeding, endometrial sampling is indicated to exclude carcinoma. If results are benign, sonohysterogram should be considered for focal lesion work-up.  3. Nonvisualized ovaries   09/23/2019 Pathology Results   A. ENDOMETRIUM, BIOPSY:  - Poorly differentiated carcinoma   10/15/2019 Imaging   Ct scan of chest, abdomen and pelvis: No evidence of metastatic disease within the chest, abdomen, or pelvis.   Enlarged fibroid uterus.   Colonic diverticulosis. No radiographic evidence of diverticulitis.   Aortic and coronary artery atherosclerosis.     10/22/2019 Pathology Results   SURGICAL PATHOLOGY   FINAL MICROSCOPIC DIAGNOSIS:   A. UTERUS, BILATERAL TUBES AND OVARIES, HYSTERECTOMY:  Poorly differentiated carcinoma, 6.5 cm.  Lymphovascular involvement by tumor.  Carcinoma involves inner half of the myometrium.  Margins not involved.  Cervix, bilateral fallopian tubes and bilateral ovaries free of tumor.   B. LYMPH NODE, RIGHT EXTERNAL SENTINEL, BIOPSY:  One lymph node with no metastatic carcinoma (0/1).   C. LYMPH NODE, RIGHT PELVIC, BIOPSY  Five lymph nodes with no metastatic carcinoma (0/5).   D. LYMPH NODE, LEFT PELVIC, BIOPSY:  Five lymph nodes with no metastatic carcinoma (0/5).   E. LYMPH NODE, RIGHT PERI AORTIC, BIOPSY:  One lymph node with no metastatic carcinoma (0/1).   F. LYMPH NODE, LEFT PERI AORTIC, BIOPSY:  Five lymph nodes with no metastatic carcinoma (0/5).    ONCOLOGY TABLE:  UTERUS, CARCINOMA OR CARCINOSARCOMA   Procedure: Total  hysterectomy with bilateral f-oophorectomy and sentinel  lymph nodes.  Histologic type: Poorly  differentiated carcinoma, see comment.  Histologic Grade: High-grade, FIGO 3.  Myometrial invasion:       Depth of invasion: 13 mm       Myometrial thickness: 40 mm  Uterine Serosa Involvement: Not identified  Cervical stromal involvement: Not identified  Extent of involvement of other organs: Not identified  Lymphovascular invasion: Present  Regional Lymph Nodes:       Examined:     17 Sentinel                               0 non-sentinel                               17 total        Lymph nodes with metastasis: 0        Isolated tumor cells (<0.2 mm): 0        Micrometastasis:  (>0.2 mm and < 2.0 mm): 0        Macrometastasis: (>2.0 mm): 0  Representative Tumor Block: A5, A6, A7 and A8.  MMR / MSI testing: Pending  Pathologic Stage Classification (pTNM, AJCC 8th edition):  pT1a, pN0  Comments: The carcinoma is a high-grade poorly differentiated carcinoma which morphologically has predominantly serous features.  There are a few foci with squamous differentiation and a rare focus of clear cell features.  Immunohistochemistry for cytokeratin AE1/AE3 is performed on the sentinel lymph nodes and no positivity is identified.    10/22/2019 Surgery   Pre-operative Diagnosis: endometrial cancer grade 3   Post-operative Diagnosis: same,    Operation: Robotic-assisted laparoscopic total hysterectomy for uterus >250gm with bilateral salpingoophorectomy, SLN mapping, bilateral pelvic and para-aortic lymphadenectomy.   Surgeon: Donaciano Eva    Operative Findings:  : 16cm bulky fibroid uterus, normal ovaries bilaterally, no suspicious lymph nodes.     11/10/2019 Cancer Staging   Staging form: Corpus Uteri - Carcinoma and Carcinosarcoma, AJCC 8th Edition - Pathologic: Stage IVB (pT1a, pN0, cM1) - Signed by [name], MD on 05/16/2020   05/10/2020 Imaging   1. New hypodense 2.0 cm segment 4A left liver lobe mass, suspicious for hepatic metastasis. 2. New left pelvic sidewall 1.4 cm  soft tissue nodule, suspicious for left internal iliac nodal metastasis. 3. New left vaginal cuff 1.6 x 1.3 cm soft tissue nodule, suspicious for recurrent tumor. 4. Aortic Atherosclerosis (ICD10-I70.0).   05/18/2020 Procedure   Successful placement of a right internal jugular approach power injectable Port-A-Cath. The catheter is ready for immediate use.   05/23/2020 -  Chemotherapy   The patient had carboplatin and taxol for chemotherapy treatment.     07/28/2020 Imaging   1. No findings identified to suggest recurrent or metastatic disease. 2. Indeterminate and slightly exophytic lesion arising from the inferior pole of left kidney measures 0.8 cm. Technically this is too small to reliably characterize. Attention on follow-up imaging is advised. 3. Aortic atherosclerosis and LAD coronary artery atherosclerotic calcifications.   11/07/2020 Imaging   1. Interval progression of right pelvic sidewall lymphadenopathy, highly concerning for disease progression. PET-CT may prove helpful to further evaluate as clinically warranted. 2. Interval resolution of the previously identified hypodense lesion in segment IVA of the liver. 3. Stable 7 mm subcapsular lesion in the lower pole left kidney with attenuation higher than would be  expected for a simple cyst but is too small to reliably characterize. Attention on follow-up recommended. 4. Aortic Atherosclerosis (ICD10-I70.0).   11/16/2020 -  Chemotherapy   The patient had lenvatinib 20 mg daily dose (LENVIMA) 2 x 10 MG capsule, 10 mg (100 % of original dose 10 mg), Oral, Daily, 1 of 1 cycle, Start date: 11/08/2020, End date: 11/08/2020 Dose modification: 10 mg (original dose 10 mg, Cycle 0) pembrolizumab (KEYTRUDA) 200 mg in sodium chloride 0.9 % 50 mL chemo infusion, 200 mg, Intravenous, Once, 3 of 6 cycles Administration: 200 mg (11/16/2020), 200 mg (01/04/2021), 200 mg (12/07/2020)  for chemotherapy treatment.      REVIEW OF SYSTEMS:    Constitutional: Denies fevers, chills or abnormal weight loss Eyes: Denies blurriness of vision Ears, nose, mouth, throat, and face: Denies mucositis or sore throat Respiratory: Denies cough, dyspnea or wheezes Cardiovascular: Denies palpitation, chest discomfort or lower extremity swelling Gastrointestinal:  Denies nausea, heartburn or change in bowel habits Skin: Denies abnormal skin rashes Lymphatics: Denies new lymphadenopathy or easy bruising Neurological:Denies numbness, tingling or new weaknesses Behavioral/Psych: Mood is stable, no new changes  All other systems were reviewed with the patient and are negative.  I have reviewed the past medical history, past surgical history, social history and family history with the patient and they are unchanged from previous note.  ALLERGIES:  is allergic to benicar hct [olmesartan medoxomil-hctz], bactrim [sulfamethoxazole-trimethoprim], pravastatin, shellfish allergy, amlodipine, and iodinated diagnostic agents.  MEDICATIONS:  Current Outpatient Medications  Medication Sig Dispense Refill  . lisinopril (ZESTRIL) 5 MG tablet Take 1 tablet (5 mg total) by mouth daily. 30 tablet 11  . acetaminophen (TYLENOL) 500 MG tablet Take 1,000 mg by mouth every 6 (six) hours as needed for moderate pain.    Marland Kitchen ALPRAZolam (XANAX) 0.25 MG tablet Take 1 tablet (0.25 mg total) by mouth 2 (two) times daily as needed for anxiety. 30 tablet 0  . APPLE CIDER VINEGAR PO Take 5 mLs by mouth daily.     Marland Kitchen aspirin EC 81 MG tablet Take 81 mg by mouth daily. Swallow whole.    Marland Kitchen atenolol (TENORMIN) 25 MG tablet Take 1 tablet (25 mg total) by mouth daily. 30 tablet 3  . b complex vitamins tablet Take 1 tablet by mouth daily.    . Blood Pressure Monitoring (BLOOD PRESSURE KIT) DEVI 1 Units by Does not apply route every morning. 1 each 0  . calcium-vitamin D (OSCAL WITH D) 500-200 MG-UNIT tablet Take 1 tablet by mouth daily with breakfast.     . diphenhydrAMINE (BENADRYL) 25  MG tablet Take 25 mg by mouth every 6 (six) hours as needed for allergies.    . famotidine (PEPCID) 20 MG tablet Take 20 mg by mouth 2 (two) times daily as needed for heartburn or indigestion.    . hydrochlorothiazide (HYDRODIURIL) 25 MG tablet Take 0.5 tablets (12.5 mg total) by mouth daily. 90 tablet 1  . lenvatinib 10 mg daily dose (LENVIMA, 10 MG DAILY DOSE,) capsule Take 1 capsule (10 mg total) by mouth daily. 30 capsule 11  . loratadine (CLARITIN) 10 MG tablet Take 10 mg by mouth daily as needed for allergies.    . Multiple Vitamin (MULTIVITAMIN) capsule Take 1 capsule by mouth daily.    . ondansetron (ZOFRAN) 8 MG tablet Take 1 tablet (8 mg total) by mouth every 8 (eight) hours as needed. Start on the third day after chemotherapy. 30 tablet 1  . oxyCODONE (OXY IR/ROXICODONE) 5 MG immediate  release tablet Take 1 tablet (5 mg total) by mouth every 4 (four) hours as needed for severe pain. 30 tablet 0  . polyethylene glycol (MIRALAX / GLYCOLAX) 17 g packet Take 17 g by mouth daily as needed for mild constipation.    . prochlorperazine (COMPAZINE) 10 MG tablet Take 1 tablet (10 mg total) by mouth every 6 (six) hours as needed (Nausea or vomiting). 30 tablet 1  . Vitamin D, Ergocalciferol, (DRISDOL) 1.25 MG (50000 UNIT) CAPS capsule Take 1 capsule (50,000 Units total) by mouth every 7 (seven) days for 12 doses. 12 capsule 0   No current facility-administered medications for this visit.   Facility-Administered Medications Ordered in Other Visits  Medication Dose Route Frequency Provider Last Rate Last Admin  . sodium chloride flush (NS) 0.9 % injection 10 mL  10 mL Intracatheter PRN Alvy Bimler, Chen Holzman, MD   10 mL at 01/04/21 1130    PHYSICAL EXAMINATION: ECOG PERFORMANCE STATUS: 1 - Symptomatic but completely ambulatory  Vitals:   01/04/21 0921  BP: (!) 147/86  Pulse: 64  Resp: 17  Temp: 98.2 F (36.8 C)  SpO2: 100%   Filed Weights   01/04/21 0921  Weight: 193 lb 3.2 oz (87.6 kg)     GENERAL:alert, no distress and comfortable SKIN: skin color, texture, turgor are normal, no rashes or significant lesions EYES: normal, Conjunctiva are pink and non-injected, sclera clear OROPHARYNX:no exudate, no erythema and lips, buccal mucosa, and tongue normal  NECK: supple, thyroid normal size, non-tender, without nodularity LYMPH:  no palpable lymphadenopathy in the cervical, axillary or inguinal LUNGS: clear to auscultation and percussion with normal breathing effort HEART: regular rate & rhythm and no murmurs and no lower extremity edema ABDOMEN:abdomen soft, non-tender and normal bowel sounds Musculoskeletal:no cyanosis of digits and no clubbing  NEURO: alert & oriented x 3 with fluent speech, no focal motor/sensory deficits  LABORATORY DATA:  I have reviewed the data as listed    Component Value Date/Time   NA 138 01/04/2021 0900   NA 140 07/04/2017 0952   K 3.5 01/04/2021 0900   CL 100 01/04/2021 0900   CO2 28 01/04/2021 0900   GLUCOSE 108 (H) 01/04/2021 0900   BUN 14 01/04/2021 0900   BUN 10 07/04/2017 0952   CREATININE 0.87 01/04/2021 0900   CREATININE 0.81 03/23/2015 0946   CALCIUM 9.2 01/04/2021 0900   PROT 7.4 01/04/2021 0900   ALBUMIN 3.5 01/04/2021 0900   AST 17 01/04/2021 0900   ALT 13 01/04/2021 0900   ALKPHOS 90 01/04/2021 0900   BILITOT 0.6 01/04/2021 0900   GFRNONAA >60 01/04/2021 0900   GFRNONAA 78 03/23/2015 0946   GFRAA >60 09/12/2020 0853   GFRAA >89 03/23/2015 0946    No results found for: SPEP, UPEP  Lab Results  Component Value Date   WBC 4.0 01/04/2021   NEUTROABS 2.3 01/04/2021   HGB 12.6 01/04/2021   HCT 39.9 01/04/2021   MCV 75.1 (L) 01/04/2021   PLT 188 01/04/2021      Chemistry      Component Value Date/Time   NA 138 01/04/2021 0900   NA 140 07/04/2017 0952   K 3.5 01/04/2021 0900   CL 100 01/04/2021 0900   CO2 28 01/04/2021 0900   BUN 14 01/04/2021 0900   BUN 10 07/04/2017 0952   CREATININE 0.87 01/04/2021 0900    CREATININE 0.81 03/23/2015 0946      Component Value Date/Time   CALCIUM 9.2 01/04/2021 0900   ALKPHOS 90  01/04/2021 0900   AST 17 01/04/2021 0900   ALT 13 01/04/2021 0900   BILITOT 0.6 01/04/2021 0900       RADIOGRAPHIC STUDIES: I have personally reviewed the radiological images as listed and agreed with the findings in the report. DG Chest 2 View  Result Date: 12/19/2020 CLINICAL DATA:  Shortness of breath. EXAM: CHEST - 2 VIEW COMPARISON:  November 25, 2020 FINDINGS: There is stable right-sided venous Port-A-Cath positioning. The heart size and mediastinal contours are within normal limits. Both lungs are clear. Surgical clips are again seen overlying the left lung base. The visualized skeletal structures are unremarkable. IMPRESSION: No active cardiopulmonary disease. Electronically Signed   By: Virgina Norfolk M.D.   On: 12/19/2020 21:59

## 2021-01-04 NOTE — Assessment & Plan Note (Signed)
She is doing well today Her blood pressure control has improved but I think she could benefit from medication adjustment We discussed minimum 4 cycles of treatment before repeating imaging study We will continue treatment as scheduled

## 2021-01-04 NOTE — Assessment & Plan Note (Signed)
I reviewed CBC with the patient and explained the abnormal CBC is due to thalassemia trait

## 2021-01-04 NOTE — Assessment & Plan Note (Signed)
Her blood pressure is still suboptimally controlled She has severe hypokalemia recently I recommend reducing hydrochlorothiazide to half and to introduce lisinopril I explained to the patient the rationale of changing her blood pressure medications We will call her again in a few days to check on her blood pressure management

## 2021-01-05 ENCOUNTER — Other Ambulatory Visit: Payer: Self-pay | Admitting: Internal Medicine

## 2021-01-05 LAB — T4: T4, Total: 8.8 ug/dL (ref 4.5–12.0)

## 2021-01-05 MED ORDER — CHLORTHALIDONE 25 MG PO TABS: 12.5000 mg | ORAL_TABLET | Freq: Every day | ORAL | 1 refills | Status: DC

## 2021-01-05 MED FILL — LENVIMA 10 MG DAILY DOSE: 10 | 30 days supply | Qty: 30 | Fill #2

## 2021-01-09 ENCOUNTER — Other Ambulatory Visit: Payer: Self-pay

## 2021-01-09 ENCOUNTER — Ambulatory Visit: Payer: Medicare Other | Attending: Internal Medicine

## 2021-01-09 DIAGNOSIS — R262 Difficulty in walking, not elsewhere classified: Secondary | ICD-10-CM

## 2021-01-09 DIAGNOSIS — M25551 Pain in right hip: Secondary | ICD-10-CM | POA: Diagnosis not present

## 2021-01-09 DIAGNOSIS — M6281 Muscle weakness (generalized): Secondary | ICD-10-CM | POA: Diagnosis not present

## 2021-01-09 NOTE — Therapy (Signed)
[place], [place], [address] Phone: [phone]   Fax:  [phone]  Physical Therapy Treatment  Patient Details  Name: Tina Stone MRN: [id number] Date of Birth: [birth year] Referring Provider (PT): Isaac Bliss, Rayford Halsted, MD   Encounter Date: 01/09/2021   PT End of Session - 01/09/21 0920    Visit Number 4    Number of Visits 13    Date for PT Re-Evaluation 01/21/21    Authorization Type [employer] MCR    Authorization Time Period FOTO visit 6, PN visit 10    PT Start Time [phone]    PT Stop Time 0930    PT Time Calculation (min) 44 min    Activity Tolerance Patient tolerated treatment well    Behavior During Therapy [facility] for tasks assessed/performed           Past Medical History:  Diagnosis Date  . Adenomatous colon polyp   . Allergic rhinitis, seasonal   . Allergy   . Beta thalassemia trait 11/25/2013  . Cholelithiasis   . Class 3 obesity without serious comorbidity with body mass index (BMI) of 40.0 to 44.9 in adult 11/19/2012  . endometrial ca dx'd 08/2009   endometrial   . GERD (gastroesophageal reflux disease)   . HLD (hyperlipidemia)   . Hypercholesterolemia   . Hypertension 03/18/2017   no meds   . Intraductal papilloma of left breast    Patient underwent left needle-localized lumpectomy by Dr. Imogene Burn. [place] on 09/09/2013; pathology showed intraductal papilloma with no atypia or malignancy identified.  Marland Kitchen PONV (postoperative nausea and vomiting)   . Pre-diabetes    pt denies  . Uterine fibroid     Past Surgical History:  Procedure Laterality Date  . [place]   right  . BREAST EXCISIONAL BIOPSY Left 2014   benign  . BREAST LUMPECTOMY WITH NEEDLE LOCALIZATION Left 09/09/2013   Procedure: BREAST LUMPECTOMY WITH NEEDLE LOCALIZATION;  Surgeon: Imogene Burn. Georgette Dover, MD;  Location: [place];  Service: General;  Laterality: Left;  . CHOLECYSTECTOMY    . COLONOSCOPY     . IR IMAGING GUIDED PORT INSERTION  05/18/2020  . POLYPECTOMY    . ROBOTIC ASSISTED LAPAROSCOPIC CHOLECYSTECTOMY  09/09/2019  . ROBOTIC ASSISTED TOTAL HYSTERECTOMY WITH BILATERAL SALPINGO OOPHERECTOMY N/A 10/22/2019   Procedure: XI ROBOTIC ASSISTED TOTAL HYSTERECTOMY WITH BILATERAL SALPINGO OOPHORECTOMY GREATER THAN 250 GRAMS, MINI LAPARTOMY FOR SPECIMEN DELIVERY; PELVIC AND PERI-AORTIC LYMPHADENECTOMY;  Surgeon: Everitt Amber, MD;  Location: [facility];  Service: Gynecology;  Laterality: N/A;  . SENTINEL NODE BIOPSY N/A 10/22/2019   Procedure: SENTINEL NODE BIOPSY;  Surgeon: Everitt Amber, MD;  Location: [facility];  Service: Gynecology;  Laterality: N/A;    There were no vitals filed for this visit.   Subjective Assessment - 01/09/21 0848    Subjective Patient reports regarding hip pain she hasn't had any pain in while. She has not been completing exercises as much as she would like, but the exercises are helping with her pain and mobility.    How long can you sit comfortably? "just normal, I don't have any problems sitting."    How long can you stand comfortably? "I am getting a lot better. I am up standing for about an hour at church."    How long can you walk comfortably? "it's getting better, I can do normal"    Currently in Pain? No/denies  [place] Adult PT Treatment/Exercise - 01/09/21 0001      Self-Care   Other Self-Care Comments  see patient education      Knee/Hip Exercises: Standing   Abduction Limitations 2 x 10 bilateral    Extension Limitations 2 x 10 bilateral    Gait Training focusing on appropriate toe off, SL balance to decrease hip drop    Other Standing Knee Exercises heel/toe rocks 2 x 20      Knee/Hip Exercises: Seated   Long Arc Quad Limitations 2 x 10; 1lb      Knee/Hip Exercises: Supine   Bridges Limitations 2 x 10      Knee/Hip Exercises: Sidelying   Clams 2 x 10 red TB                  PT Education -  01/09/21 0924    Education Details Education on updated HEP. education on aquatic therapy expectations    Person(s) Educated Patient    Methods Explanation;Demonstration;Verbal cues;Handout    Comprehension Verbalized understanding;Returned demonstration            PT Short Term Goals - 01/09/21 0913      PT SHORT TERM GOAL #1   Title Pt will demo heel-toe gait pattern    Baseline flat foot strike with antalgic pattern at eval; heel strike initial contact, limited toe off 01/09/21    Time 3    Period Weeks    Status On-going    Target Date 12/30/20      PT SHORT TERM GOAL #2   Title pt will verbalize ability to use HEP to improve daily mobility of hip joint    Baseline began establishing at eval; patient reports HEP is helping with pain/mobility isn't as consistent as she plans.    Time 3    Period Weeks    Status Achieved    Target Date 12/30/20             PT Long Term Goals - 12/09/20 0846      PT LONG TERM GOAL #1   Title pt will ambulate without antalgic gait pattern with use of LRAD    Baseline using SPC with significant antalgic pattern at eval    Time 6    Period Weeks    Status New    Target Date 01/21/21      PT LONG TERM GOAL #2   Title pain <=2/10 with ADLS    Baseline 8-1/27 normally reported at eval    Time 6    Period Weeks    Status New    Target Date 01/21/21      PT LONG TERM GOAL #3   Title pt will be independent in long term aquatics strengthening program to continue after d/c    Baseline has not been in the pool in about 6-8 months but would like to return    Time 6    Period Weeks    Status New    Target Date 01/21/21      PT LONG TERM GOAL #4   Title gross hip strength to 4+/5    Baseline 3-/5 at eval    Time 6    Period Weeks    Status New    Target Date 01/21/21                 Plan - 01/09/21 0915    Clinical Impression Statement Patient's gait mechanics are gradually improving with ability to demo appropriate  heel  strike, though lateral trunk lean present with stance phase on RLE. Patient able to demonstrate proper pelvic alignment during short duration of SLS, though minimal carry over with gait training signifying continued focus on overall hip strengthening at future sessions. Overall good tolerance to today's session without reports of pain, though patient reports fatigue at end of session.    PT Treatment/Interventions ADLs/Self Care Home Management;Cryotherapy;Gait training;Stair training;Functional mobility training;Therapeutic activities;Therapeutic exercise;Neuromuscular re-education;Manual techniques;Patient/family education;Balance training;Passive range of motion;Taping;Aquatic Therapy    PT Next Visit Plan cont gait training, gross hip strength    PT Home Exercise Plan [id number]    Consulted and Agree with Plan of Care Patient           Patient will benefit from skilled therapeutic intervention in order to improve the following deficits and impairments:  Abnormal gait,Decreased range of motion,Difficulty walking,Increased muscle spasms,Decreased activity tolerance,Pain,Improper body mechanics,Decreased mobility,Decreased strength  Visit Diagnosis: Pain in right hip  Difficulty in walking, not elsewhere classified  Muscle weakness (generalized)     Problem List Patient Active Problem List   Diagnosis Date Noted  . Fall 11/29/2020  . Mild anxiety 11/28/2020  . Essential hypertension 11/16/2020  . Cancer associated pain 11/08/2020  . Vitamin D deficiency 10/27/2020  . Pancytopenia, acquired ([place]) 09/12/2020  . Skin rash 08/17/2020  . Metastasis to liver ([place]) 06/14/2020  . Bone pain 05/27/2020  . Goals of care, counseling/discussion 05/16/2020  . Endometrial cancer ([place]) 10/22/2019  . Postmenopausal bleeding 09/02/2019  . Biliary colic [id number]  . Breast pain, left 05/20/2019  . Trapezius muscle spasm 05/11/2019  . Elevated random blood glucose level 02/13/2019  . Epigastric  pain 01/29/2019  . Prediabetes 03/18/2017  . Hypertension 03/18/2017  . Beta thalassemia trait 11/25/2013  . Class II obesity 11/19/2012  . Women's annual routine gynecological examination 07/12/2011  . Gastroesophageal reflux disease 01/28/2007  . Hyperlipidemia 10/10/2006  Gwendolyn Grant, PT, DPT, ATC 01/09/21 9:39 AM  [facility] [address] [place], [place], [address] Phone: [phone]   Fax:  [phone]  Name: Tina Stone MRN: [id number] Date of Birth: 1950/10/26

## 2021-01-09 NOTE — Patient Instructions (Signed)
Aquatic Therapy: What to Expect! ° °Where:  °[facility]           NOTE: You will receive an automated phone message °[address]           reminding you of your appointment and it will say the  °[place], [place]  [address]           appointment is at the [address] clinic.  We are  °[phone]             working to fix this - just know that you will meet us at  °              pool! ° ° °How to Prepare: °• Please make sure you drink 8 ounces of water about one hour prior to your pool session °• A caregiver may attend if needed with the patient to help assist as needed. A caregiver can sit in the bleachers next to the pool. °• Please arrive IN YOUR SUIT and 15 minutes prior to your appointment - this helps to avoid delays in starting your session. °• Please make sure to attend to any toileting needs prior to entering the pool °• Locker rooms for changing are located to the right of the check -in desk.  There is direct access to the pool deck form the locker room.  You can lock your belongings in a locker but you must bring you own lock. °• Once on the pool deck your therapist will ask you to sign the Patient  Consent and Assignment of Benefits form °• Your therapist may take your blood pressure prior to, during and after your session if indicated °• We usually try and create a home exercise program based on activities we do in the pool.  Please be thinking about who might be able to assist you in the pool should you need to participate in an aquatic home exercise program at the time of discharge if you need assistance.  Some patients do not want to or do not have the ability to participate in an aquatic home program - this is not a barrier in any way to you participating in aquatic therapy as part of your current therapy plan! °• After Discharge from PT, you can continue using home program at  the [facility], there is a drop-in fee for $5 ($45 a month)or for 60 years   or older $4.00 ($40 a month for seniors ) ° ° ° °About the pool: °1. Entering the pool °Your therapist will assist you; there are multiple ways to enter including stairs with railings, a walk in ramp, a roll in chair and a mechanical lift. Your therapist will determine the most appropriate way for you. °2. Water temperature is usually between 86-87 degrees °3. There may be other swimmers in the pool at the same time °4. There is availability at the pool for ° ° ° ° °Contact Info:             Appointments: Please call the [facility] °[facility]         Center if you need to cancel or reschedule an appointment. °[address]             [phone]   °[place],             All sessions are 45 minutes      [phone]       °     ° ° ° ° ° °       ° °

## 2021-01-11 ENCOUNTER — Telehealth: Payer: Self-pay

## 2021-01-11 ENCOUNTER — Emergency Department (HOSPITAL_COMMUNITY)
Admission: EM | Admit: 2021-01-11 | Discharge: 2021-01-11 | Disposition: A | Discharge status: Home / Self Care | Payer: Medicare Other | Attending: Emergency Medicine | Admitting: Emergency Medicine

## 2021-01-11 ENCOUNTER — Ambulatory Visit (INDEPENDENT_AMBULATORY_CARE_PROVIDER_SITE_OTHER): Payer: Medicare Other | Admitting: Family Medicine

## 2021-01-11 ENCOUNTER — Other Ambulatory Visit: Payer: Self-pay

## 2021-01-11 ENCOUNTER — Encounter: Payer: Self-pay | Admitting: Family Medicine

## 2021-01-11 ENCOUNTER — Ambulatory Visit: Payer: Medicare Other | Admitting: Physical Therapy

## 2021-01-11 ENCOUNTER — Encounter (HOSPITAL_COMMUNITY): Payer: Self-pay

## 2021-01-11 VITALS — BP 138/80 | HR 68 | Temp 97.5°F | Ht 64.0 in | Wt 192.2 lb

## 2021-01-11 DIAGNOSIS — I10 Essential (primary) hypertension: Secondary | ICD-10-CM | POA: Diagnosis not present

## 2021-01-11 DIAGNOSIS — Z5321 Procedure and treatment not carried out due to patient leaving prior to being seen by health care provider: Secondary | ICD-10-CM | POA: Diagnosis not present

## 2021-01-11 NOTE — Telephone Encounter (Signed)
-----   Message from [name], MD sent at 01/11/2021 11:06 AM EST ----- Regarding: RE: BP PCP to manage ----- Message ----- From: Rennis Harding, RN Sent: 01/11/2021  10:32 AM EST To: [name], MD Subject: RE: BP                                         Patient reporting the following readings:  124/86, 127/87, 138/97, 135/91, 110/78, 132/90, 124/80, 124/88, 134/88.    Pt verbalized she is being compliant with new BP medication regiment as noted in chart.   Pt voiced she was having some dizziness with lower readings, 110/78.  I educated patient on slow movements to avoid orthostatic hypotension -   Pt also with new hoarseness, dry throat.   Pt states this started prior to started to lisinopril.     ----- Message ----- From: [name], MD Sent: 01/11/2021   9:36 AM EST To: Rennis Harding, RN Subject: BP                                             Can you call and ask how is her BP doing?

## 2021-01-11 NOTE — Telephone Encounter (Signed)
RN notified pt of MD recommendations. Pt verbalized agreement and understanding.   Pt verbalized she will contact PCP.

## 2021-01-11 NOTE — ED Triage Notes (Signed)
Patient arrived stating she took a blood pressure pill late today and when she checked her blood pressure at home it was reading high. Patient hypertensive in triage. 159/112

## 2021-01-11 NOTE — Progress Notes (Signed)
   Subjective:    Patient ID: Tina Stone, female    DOB: 1950/11/07, 71 y.o.   MRN: [id number]  HPI Here to check her BP and with questions about her medications. She has HTN and she takes Atenolol and Chlorthalidone for that. Her medication list also contains HCTZ, but she stopped taking this over a year ago. She saw Dr. Alvy Bimler for an Oncology follow up on 01-04-21, and her BP was elevated. Dr. Alvy Bimler started her on Lisinopril 5 mg daily. She also told her to clear up her medication list with her PCP. Today she feels fine and her BP has been well controlled at home.    Review of Systems  Constitutional: Negative.   Respiratory: Negative.   Cardiovascular: Negative.        Objective:   Physical Exam Constitutional:      Appearance: Normal appearance.  Cardiovascular:     Rate and Rhythm: Normal rate and regular rhythm.     Pulses: Normal pulses.     Heart sounds: Normal heart sounds.  Pulmonary:     Effort: Pulmonary effort is normal.     Breath sounds: Normal breath sounds.  Musculoskeletal:     Right lower leg: No edema.     Left lower leg: No edema.  Neurological:     Mental Status: She is alert.           Assessment & Plan:  Her BP is well controlled on Lisinopril and Chlorthalidone. She does not take HCTZ., so this was removed from her med list. She will follow up with Dr. Jerilee Hoh in one month. Alysia Penna, MD

## 2021-01-16 ENCOUNTER — Ambulatory Visit: Payer: Medicare Other

## 2021-01-17 ENCOUNTER — Telehealth: Payer: Self-pay | Admitting: Physical Therapy

## 2021-01-17 NOTE — Telephone Encounter (Signed)
Left message about Attending Aquatic Therapy session at the [facility] 01-18-21 at 1:30 PM  Pt was asked to call [phone] if she is not able to attend appt   Voncille Lo, PT, [place] Certified Exercise Expert for the Aging Adult  01/17/21 2:59 PM Phone: [phone] Fax: [phone]

## 2021-01-18 ENCOUNTER — Ambulatory Visit: Payer: Medicare Other | Admitting: Physical Therapy

## 2021-01-18 ENCOUNTER — Other Ambulatory Visit: Payer: Self-pay

## 2021-01-18 ENCOUNTER — Encounter: Payer: Self-pay | Admitting: Physical Therapy

## 2021-01-18 DIAGNOSIS — M25551 Pain in right hip: Secondary | ICD-10-CM

## 2021-01-18 DIAGNOSIS — M6281 Muscle weakness (generalized): Secondary | ICD-10-CM

## 2021-01-18 DIAGNOSIS — R262 Difficulty in walking, not elsewhere classified: Secondary | ICD-10-CM

## 2021-01-18 NOTE — Therapy (Signed)
[place], [place], [address] Phone: [phone]   Fax:  [phone]  Physical Therapy Treatment  Patient Details  Name: Tina Stone MRN: [id number] Date of Birth: 11/26/1950 Referring Provider (PT): Isaac Bliss, Rayford Halsted, MD   Encounter Date: 01/18/2021   PT End of Session - 01/18/21 1701    Visit Number 5    Number of Visits 13    Date for PT Re-Evaluation 01/21/21    Authorization Type [employer] MCR    Authorization Time Period FOTO visit 6, PN visit 10    PT Start Time 1325    PT Stop Time 1413    PT Time Calculation (min) 48 min    Activity Tolerance Patient tolerated treatment well    Behavior During Therapy [facility] for tasks assessed/performed           Past Medical History:  Diagnosis Date  . Adenomatous colon polyp   . Allergic rhinitis, seasonal   . Allergy   . Beta thalassemia trait 11/25/2013  . Cholelithiasis   . Class 3 obesity without serious comorbidity with body mass index (BMI) of 40.0 to 44.9 in adult 11/19/2012  . endometrial ca dx'd 08/2009   endometrial   . GERD (gastroesophageal reflux disease)   . HLD (hyperlipidemia)   . Hypercholesterolemia   . Hypertension 03/18/2017   no meds   . Intraductal papilloma of left breast    Patient underwent left needle-localized lumpectomy by Dr. Imogene Burn. [place]i on 09/09/2013; pathology showed intraductal papilloma with no atypia or malignancy identified.  Marland Kitchen PONV (postoperative nausea and vomiting)   . Pre-diabetes    pt denies  . Uterine fibroid     Past Surgical History:  Procedure Laterality Date  . [place]   right  . BREAST EXCISIONAL BIOPSY Left 2014   benign  . BREAST LUMPECTOMY WITH NEEDLE LOCALIZATION Left 09/09/2013   Procedure: BREAST LUMPECTOMY WITH NEEDLE LOCALIZATION;  Surgeon: Imogene Burn. Georgette Dover, MD;  Location: [place];  Service: General;  Laterality: Left;  . CHOLECYSTECTOMY    . COLONOSCOPY     . IR IMAGING GUIDED PORT INSERTION  05/18/2020  . POLYPECTOMY    . ROBOTIC ASSISTED LAPAROSCOPIC CHOLECYSTECTOMY  09/09/2019  . ROBOTIC ASSISTED TOTAL HYSTERECTOMY WITH BILATERAL SALPINGO OOPHERECTOMY N/A 10/22/2019   Procedure: XI ROBOTIC ASSISTED TOTAL HYSTERECTOMY WITH BILATERAL SALPINGO OOPHORECTOMY GREATER THAN 250 GRAMS, MINI LAPARTOMY FOR SPECIMEN DELIVERY; PELVIC AND PERI-AORTIC LYMPHADENECTOMY;  Surgeon: Everitt Amber, MD;  Location: [facility];  Service: Gynecology;  Laterality: N/A;  . SENTINEL NODE BIOPSY N/A 10/22/2019   Procedure: SENTINEL NODE BIOPSY;  Surgeon: Everitt Amber, MD;  Location: [facility];  Service: Gynecology;  Laterality: N/A;    There were no vitals filed for this visit.   Subjective Assessment - 01/18/21 1658    Subjective I am not having any pain in my right hip now, I just cant move it as well  as my left.  I know I am weaker on that side    Patient Stated Goals decrease pain, improve mobility, swimming & walking    Currently in Pain? No/denies    Pain Score 0-No pain    Pain Location Back   Right hip   Pain Orientation Mid           Aquatic therapy at [facility] - pool temp 85.5 degrees Patient seen for aquatic therapy today at [facility].   Pt enters [facility] without AD and  ambulates  without handrails via ramp. pool.  Treatment took place in water 3.5-4 feet deep depending upon activity.  Pt entered and exited the pool via ramp negotiation with no UE support but slow and deliberate stepping and  supervision in the water.  Pt initally walked forwards and backwards in the water and was educated on principles and properties and benefits of water.lenght 20 m x 3. Pt reported tightness in her back so pt educated in hip hinge /stretch in the water. Pt first initiated  hip hinge in water on steps x 10 with VC and TC for proper execution. Then standing with external cue of pool wall x 15.   Pt continued walking forwards and backwards 20 mx 2 each.  Side stepping x 4.  Then using kick  board submerged in water for increased drag/resistance 20 m x 2 forwards and backwards.  To work on balance and core mx. Using kick board submerged with pallof press movement.  PT added turbulence to increase need for postural balance while pt performed with wide base stance  On edge of pool with bil UE support  Pt performed LE exercise  Hip abd/add R/L 20 x each and then using 1 UE support Hip ext/flex with knee straight x 20,  Marching knee/hip 90/90 x 20 and then ham curl R/L x 20. Pt also used aquatic cuffs during exercises to increase resistance in water.  Heel raises in water bil with UE support on pool ledge x 20 Squats x 10 reps with intermittent UE support x 2 sets. Reverse lunge x 10 in RT and LT SLS in water to encourage balance and work on postural control mx.   Pt then continued water jogging 20 m x 4 forwards and backwards  At RPE 6-7   . Using [place] Ragaz, Pt with lumbar belt around hips and nek doodle for neck support. .  Pt assisted into supine floating position by lying head on shoulder of PT to get into floating position.  Pt at torso and assisting with trunk left to right and vice versa to engage trunk muscles.  Emphasis on breathing techniques to draw in abdominals for support.  Pt then utilizing posterior chain and engaging Hip extension and knee flexion with water resistance.  LAD of R and L LE with oscillations and relief of Low back and hip spasms.  Pt then gently comes to standing by holding onto pool ledge. Bad Ragaz uses trunk movements in water to elongate and relax and then engage trunk muscles for improving proprioception and neuromuscular functioning     Pt requires the buoyancy of water for active assisted exercises with buoyancy supported for strengthening & ROM exercises: pt requires the viscosity of the water for resistance with strengthening exercises Hydrostatic pressure also supports joints by unweighting joint load by at least 50 % in 3-4 feet depth  water. Water will allow for reduced gait deviation due to reduced joint loading through buoyancy to help patient improve posture without excess stress and pain. Pt was able to walk with increased stride length Water current provides perturbations which challenge standing balance unsupported                          PT Education - 01/18/21 1700    Education Details Educated on principles and properties of water and initial exercise at [employer]) Educated Patient    Methods Explanation;Demonstration;Tactile cues;Verbal cues    Comprehension Verbalized understanding;Returned demonstration  PT Short Term Goals - 01/09/21 0913      PT SHORT TERM GOAL #1   Title Pt will demo heel-toe gait pattern    Baseline flat foot strike with antalgic pattern at eval; heel strike initial contact, limited toe off 01/09/21    Time 3    Period Weeks    Status On-going    Target Date 12/30/20      PT SHORT TERM GOAL #2   Title pt will verbalize ability to use HEP to improve daily mobility of hip joint    Baseline began establishing at eval; patient reports HEP is helping with pain/mobility isn't as consistent as she plans.    Time 3    Period Weeks    Status Achieved    Target Date 12/30/20             PT Long Term Goals - 12/09/20 0846      PT LONG TERM GOAL #1   Title pt will ambulate without antalgic gait pattern with use of LRAD    Baseline using SPC with significant antalgic pattern at eval    Time 6    Period Weeks    Status New    Target Date 01/21/21      PT LONG TERM GOAL #2   Title pain <=2/10 with ADLS    Baseline 1-0/93 normally reported at eval    Time 6    Period Weeks    Status New    Target Date 01/21/21      PT LONG TERM GOAL #3   Title pt will be independent in long term aquatics strengthening program to continue after d/c    Baseline has not been in the pool in about 6-8 months but would like to return    Time 6     Period Weeks    Status New    Target Date 01/21/21      PT LONG TERM GOAL #4   Title gross hip strength to 4+/5    Baseline 3-/5 at eval    Time 6    Period Weeks    Status New    Target Date 01/21/21                 Plan - 01/18/21 1701    Clinical Impression Statement Ms Tonelli enters the [facility] for first aquatic therapy session..  Pt with gait deviation evident on land with Rt sided antalgic gait.  Pt  was able to continue movement for 40 continuous minutes and increasing hip AROM after RX session.  Pt tends to try to hyperextend Low back and PT was able to isolate R hip extension with VC and TC for abdominal engagement.  Water will allow for reduced gait deviation due to reduced joint loading through buoyancy to help patient improve posture without excess stress and pain. Pt was able to walk with increased stride length  Water current provides perturbations which challenge standing balance unsupported for potential carryover on land.  Pt expressed she often feel unsteady on land and was very comfortable increasing movement in water today.    Personal Factors and Comorbidities Comorbidity 2;Time since onset of injury/illness/exacerbation    Comorbidities active CA, OA    Examination-Activity Limitations Locomotion Level;Transfers;Bed Mobility;Sit;Caring for Others;Sleep;Squat;Stairs;Stand    Examination-Participation Restrictions Church;Meal Prep;Cleaning;Occupation;Driving    PT Frequency 2x / week    PT Duration 6 weeks    PT Treatment/Interventions ADLs/Self Care Home Management;Cryotherapy;Gait training;Stair training;Functional mobility training;Therapeutic activities;Therapeutic exercise;Neuromuscular  re-education;Manual techniques;Patient/family education;Balance training;Passive range of motion;Taping;Aquatic Therapy    PT Next Visit Plan cont gait training, gross hip strength    PT Home Exercise Plan [email or web address]    Consulted and Agree with Plan of Care Patient            Patient will benefit from skilled therapeutic intervention in order to improve the following deficits and impairments:  Abnormal gait,Decreased range of motion,Difficulty walking,Increased muscle spasms,Decreased activity tolerance,Pain,Improper body mechanics,Decreased mobility,Decreased strength  Visit Diagnosis: Pain in right hip  Difficulty in walking, not elsewhere classified  Muscle weakness (generalized)     Problem List Patient Active Problem List   Diagnosis Date Noted  . Fall 11/29/2020  . Mild anxiety 11/28/2020  . Essential hypertension 11/16/2020  . Cancer associated pain 11/08/2020  . Vitamin D deficiency 10/27/2020  . Pancytopenia, acquired ([place]) 09/12/2020  . Skin rash 08/17/2020  . Metastasis to liver ([place]) 06/14/2020  . Bone pain 05/27/2020  . Goals of care, counseling/discussion 05/16/2020  . Endometrial cancer ([place]) 10/22/2019  . Postmenopausal bleeding 09/02/2019  . Biliary colic 47/42/5956  . Breast pain, left 05/20/2019  . Trapezius muscle spasm 05/11/2019  . Elevated random blood glucose level 02/13/2019  . Epigastric pain 01/29/2019  . Prediabetes 03/18/2017  . Hypertension 03/18/2017  . Beta thalassemia trait 11/25/2013  . Class II obesity 11/19/2012  . Women's annual routine gynecological examination 07/12/2011  . Gastroesophageal reflux disease 01/28/2007  . Hyperlipidemia 10/10/2006    Voncille Lo, PT, [place] Certified Exercise Expert for the Aging Adult  01/18/21 6:00 PM Phone: [phone] Fax: [place] [facility] [address] [place], [place], [address] Phone: [phone]   Fax:  [phone]  Name: Tina Stone MRN: [id number] Date of Birth: 1950-09-07

## 2021-01-25 ENCOUNTER — Ambulatory Visit: Payer: Medicare Other | Admitting: Physical Therapy

## 2021-01-25 ENCOUNTER — Inpatient Hospital Stay: Payer: Medicare Other

## 2021-01-25 ENCOUNTER — Other Ambulatory Visit: Payer: Self-pay

## 2021-01-25 ENCOUNTER — Inpatient Hospital Stay (HOSPITAL_BASED_OUTPATIENT_CLINIC_OR_DEPARTMENT_OTHER): Payer: Medicare Other | Admitting: Hematology and Oncology

## 2021-01-25 ENCOUNTER — Other Ambulatory Visit: Payer: Self-pay | Admitting: Hematology and Oncology

## 2021-01-25 VITALS — BP 126/73 | HR 83 | Temp 97.8°F | Resp 18 | Ht 64.0 in | Wt 190.0 lb

## 2021-01-25 DIAGNOSIS — G893 Neoplasm related pain (acute) (chronic): Secondary | ICD-10-CM

## 2021-01-25 DIAGNOSIS — C541 Malignant neoplasm of endometrium: Secondary | ICD-10-CM | POA: Diagnosis not present

## 2021-01-25 DIAGNOSIS — Z79899 Other long term (current) drug therapy: Secondary | ICD-10-CM | POA: Diagnosis not present

## 2021-01-25 DIAGNOSIS — Z5111 Encounter for antineoplastic chemotherapy: Secondary | ICD-10-CM | POA: Diagnosis not present

## 2021-01-25 DIAGNOSIS — I10 Essential (primary) hypertension: Secondary | ICD-10-CM

## 2021-01-25 DIAGNOSIS — Z7189 Other specified counseling: Secondary | ICD-10-CM

## 2021-01-25 DIAGNOSIS — C787 Secondary malignant neoplasm of liver and intrahepatic bile duct: Secondary | ICD-10-CM | POA: Diagnosis not present

## 2021-01-25 LAB — CBC WITH DIFFERENTIAL (CANCER CENTER ONLY)
Abs Immature Granulocytes: 0.02 10*3/uL (ref 0.00–0.07)
Basophils Absolute: 0 10*3/uL (ref 0.0–0.1)
Basophils Relative: 1 %
Eosinophils Absolute: 0.4 10*3/uL (ref 0.0–0.5)
Eosinophils Relative: 8 %
HCT: 38.8 % (ref 36.0–46.0)
Hemoglobin: 12.6 g/dL (ref 12.0–15.0)
Immature Granulocytes: 0 %
Lymphocytes Relative: 24 %
Lymphs Abs: 1.3 10*3/uL (ref 0.7–4.0)
MCH: 24.3 pg — ABNORMAL LOW (ref 26.0–34.0)
MCHC: 32.5 g/dL (ref 30.0–36.0)
MCV: 74.8 fL — ABNORMAL LOW (ref 80.0–100.0)
Monocytes Absolute: 0.3 10*3/uL (ref 0.1–1.0)
Monocytes Relative: 6 %
Neutro Abs: 3.3 10*3/uL (ref 1.7–7.7)
Neutrophils Relative %: 61 %
Platelet Count: 206 10*3/uL (ref 150–400)
RBC: 5.19 MIL/uL — ABNORMAL HIGH (ref 3.87–5.11)
RDW: 17.1 % — ABNORMAL HIGH (ref 11.5–15.5)
WBC Count: 5.4 10*3/uL (ref 4.0–10.5)
nRBC: 0 % (ref 0.0–0.2)

## 2021-01-25 LAB — TOTAL PROTEIN, URINE DIPSTICK: Protein, ur: NEGATIVE mg/dL

## 2021-01-25 LAB — CMP (CANCER CENTER ONLY)
ALT: 21 U/L (ref 0–44)
AST: 22 U/L (ref 15–41)
Albumin: 3.4 g/dL — ABNORMAL LOW (ref 3.5–5.0)
Alkaline Phosphatase: 84 U/L (ref 38–126)
Anion gap: 10 (ref 5–15)
BUN: 9 mg/dL (ref 8–23)
CO2: 28 mmol/L (ref 22–32)
Calcium: 9.3 mg/dL (ref 8.9–10.3)
Chloride: 99 mmol/L (ref 98–111)
Creatinine: 0.8 mg/dL (ref 0.44–1.00)
GFR, Estimated: >60 mL/min (ref >60)
Glucose, Bld: 106 mg/dL — ABNORMAL HIGH (ref 70–99)
Potassium: 3.8 mmol/L (ref 3.5–5.1)
Sodium: 137 mmol/L (ref 135–145)
Total Bilirubin: 0.5 mg/dL (ref 0.3–1.2)
Total Protein: 6.9 g/dL (ref 6.5–8.1)

## 2021-01-25 LAB — TSH: TSH: 0.15 u[IU]/mL — ABNORMAL LOW (ref 0.308–3.960)

## 2021-01-25 MED ORDER — PREDNISONE 50 MG PO TABS: ORAL_TABLET | ORAL | 0 refills | Status: DC

## 2021-01-25 MED ORDER — SODIUM CHLORIDE 0.9% FLUSH
10.0000 mL | Freq: Once | INTRAVENOUS | Status: AC
  Administered 2021-01-25: 10 mL
  Filled 2021-01-25: qty 10

## 2021-01-26 ENCOUNTER — Telehealth: Payer: Self-pay

## 2021-01-26 ENCOUNTER — Encounter: Payer: Self-pay | Admitting: Hematology and Oncology

## 2021-01-26 LAB — T4: T4, Total: 9 ug/dL (ref 4.5–12.0)

## 2021-01-26 MED FILL — predniSONE 50 MG TABS: 50 | 1 days supply | Qty: 3 | Fill #0

## 2021-01-26 NOTE — Progress Notes (Signed)
[place] OFFICE PROGRESS NOTE  Patient Care Team: Isaac Bliss, Rayford Halsted, MD as PCP - General (Internal Medicine) Axel Filler, MD  ASSESSMENT & PLAN:  Endometrial cancer [facility]) She tolerated treatment very poorly Every visit is accompanied by an ER visit a few days later despite my best effort to manage her symptoms The patient never call us and will go straight to the ER whenever she felt that Most recently, when she developed some abdominal pain and diarrhea, she stopped Lenvima along with all her blood pressure medications and felt better I am concerned whether her recent abdominal pain could represent disease progression In any case, I do not feel strongly she is a good candidate to continue on current regimen I recommend we discontinue her treatment today and proceed with CT imaging next week for objective assessment of response to therapy If she developed complete response, potentially she can stop her treatment and be observed However, if she develop evidence of disease progression, we will have to switch her to something else She is in agreement with the plan of care  Cancer associated pain She has intermittent abdominal pain which she attributed that to side effects of medication She did not take her pain medicine recently As above, I plan to order CT imaging for further follow-up  Essential hypertension She has poorly controlled hypertension with very frequent ER visits whenever she felt symptomatic Since her discontinue all her treatment, her blood pressure is normal today I will observe for now as above If we discontinue Lenvima permanently, we can probably stop all her blood pressure medications permanently   Orders Placed This Encounter  Procedures  . CT CHEST ABDOMEN PELVIS W CONTRAST    Standing Status:   Future    Standing Expiration Date:   01/25/2022    Order Specific Question:   Preferred imaging location?    Answer:   [facility]    Order Specific Question:   Radiology Contrast Protocol - do NOT remove file path    Answer:   [email or web address]    All questions were answered. The patient knows to call the clinic with any problems, questions or concerns. The total time spent in the appointment was 40 minutes encounter with patients including review of chart and various tests results, discussions about plan of care and coordination of care plan   [name], MD 01/26/2021 8:44 AM  INTERVAL HISTORY: Please see below for problem oriented charting. She returns for chemotherapy and follow-up I found out that the patient went to the emergency department again recently She has severe abdominal pain and diarrhea recently It lasted for 3 to 4 days We did not receive a phone call regarding this The patient made her own decision to stop Lenvima and all her blood pressure medications and then felt better Today, her bowel movement is back to normal She denies abdominal pain or nausea  SUMMARY OF ONCOLOGIC HISTORY: Oncology History Overview Note  Poorly differentiated carcinoma, mixed histology with squamous differentiation, rare focus of clear cells as well as serous features MSI stable Her2 negative   Endometrial cancer ([place])  09/01/2019 Initial Diagnosis   The patient reported a history of postmenopausal bleeding that began 1 to 2 months before diagnosis   09/14/2019 Imaging   US pelvis 1. Enlarged uterus with numerous myometrial masses, presumably fibroids. 2. Endometrial thickness of 6.2 mm. In the setting of post-menopausal bleeding, endometrial sampling is indicated to exclude carcinoma. If results are benign, sonohysterogram  should be considered for focal lesion work-up.  3. Nonvisualized ovaries   09/23/2019 Pathology Results   A. ENDOMETRIUM, BIOPSY:  - Poorly differentiated carcinoma   10/15/2019 Imaging   Ct scan of chest, abdomen and pelvis: No evidence  of metastatic disease within the chest, abdomen, or pelvis.   Enlarged fibroid uterus.   Colonic diverticulosis. No radiographic evidence of diverticulitis.   Aortic and coronary artery atherosclerosis.     10/22/2019 Pathology Results   SURGICAL PATHOLOGY   FINAL MICROSCOPIC DIAGNOSIS:   A. UTERUS, BILATERAL TUBES AND OVARIES, HYSTERECTOMY:  Poorly differentiated carcinoma, 6.5 cm.  Lymphovascular involvement by tumor.  Carcinoma involves inner half of the myometrium.  Margins not involved.  Cervix, bilateral fallopian tubes and bilateral ovaries free of tumor.   B. LYMPH NODE, RIGHT EXTERNAL SENTINEL, BIOPSY:  One lymph node with no metastatic carcinoma (0/1).   C. LYMPH NODE, RIGHT PELVIC, BIOPSY  Five lymph nodes with no metastatic carcinoma (0/5).   D. LYMPH NODE, LEFT PELVIC, BIOPSY:  Five lymph nodes with no metastatic carcinoma (0/5).   E. LYMPH NODE, RIGHT PERI AORTIC, BIOPSY:  One lymph node with no metastatic carcinoma (0/1).   F. LYMPH NODE, LEFT PERI AORTIC, BIOPSY:  Five lymph nodes with no metastatic carcinoma (0/5).    ONCOLOGY TABLE:  UTERUS, CARCINOMA OR CARCINOSARCOMA   Procedure: Total hysterectomy with bilateral f-oophorectomy and sentinel  lymph nodes.  Histologic type: Poorly differentiated carcinoma, see comment.  Histologic Grade: High-grade, FIGO 3.  Myometrial invasion:       Depth of invasion: 13 mm       Myometrial thickness: 40 mm  Uterine Serosa Involvement: Not identified  Cervical stromal involvement: Not identified  Extent of involvement of other organs: Not identified  Lymphovascular invasion: Present  Regional Lymph Nodes:       Examined:     17 Sentinel                               0 non-sentinel                               17 total        Lymph nodes with metastasis: 0        Isolated tumor cells (<0.2 mm): 0        Micrometastasis:  (>0.2 mm and < 2.0 mm): 0        Macrometastasis: (>2.0 mm): 0  Representative Tumor  Block: A5, A6, A7 and A8.  MMR / MSI testing: Pending  Pathologic Stage Classification (pTNM, AJCC 8th edition):  pT1a, pN0  Comments: The carcinoma is a high-grade poorly differentiated carcinoma which morphologically has predominantly serous features.  There are a few foci with squamous differentiation and a rare focus of clear cell features.  Immunohistochemistry for cytokeratin AE1/AE3 is performed on the sentinel lymph nodes and no positivity is identified.    10/22/2019 Surgery   Pre-operative Diagnosis: endometrial cancer grade 3   Post-operative Diagnosis: same,    Operation: Robotic-assisted laparoscopic total hysterectomy for uterus >250gm with bilateral salpingoophorectomy, SLN mapping, bilateral pelvic and para-aortic lymphadenectomy.   Surgeon: Donaciano Eva    Operative Findings:  : 16cm bulky fibroid uterus, normal ovaries bilaterally, no suspicious lymph nodes.     11/10/2019 Cancer Staging   Staging form: Corpus Uteri - Carcinoma and Carcinosarcoma, AJCC 8th Edition - Pathologic:  Stage IVB (pT1a, pN0, cM1) - Signed by [name], MD on 05/16/2020   05/10/2020 Imaging   1. New hypodense 2.0 cm segment 4A left liver lobe mass, suspicious for hepatic metastasis. 2. New left pelvic sidewall 1.4 cm soft tissue nodule, suspicious for left internal iliac nodal metastasis. 3. New left vaginal cuff 1.6 x 1.3 cm soft tissue nodule, suspicious for recurrent tumor. 4. Aortic Atherosclerosis (ICD10-I70.0).   05/18/2020 Procedure   Successful placement of a right internal jugular approach power injectable Port-A-Cath. The catheter is ready for immediate use.   05/23/2020 -  Chemotherapy   The patient had carboplatin and taxol for chemotherapy treatment.     07/28/2020 Imaging   1. No findings identified to suggest recurrent or metastatic disease. 2. Indeterminate and slightly exophytic lesion arising from the inferior pole of left kidney measures 0.8 cm. Technically this is  too small to reliably characterize. Attention on follow-up imaging is advised. 3. Aortic atherosclerosis and LAD coronary artery atherosclerotic calcifications.   11/07/2020 Imaging   1. Interval progression of right pelvic sidewall lymphadenopathy, highly concerning for disease progression. PET-CT may prove helpful to further evaluate as clinically warranted. 2. Interval resolution of the previously identified hypodense lesion in segment IVA of the liver. 3. Stable 7 mm subcapsular lesion in the lower pole left kidney with attenuation higher than would be expected for a simple cyst but is too small to reliably characterize. Attention on follow-up recommended. 4. Aortic Atherosclerosis (ICD10-I70.0).   11/16/2020 -  Chemotherapy   The patient had lenvatinib 20 mg daily dose (LENVIMA) 2 x 10 MG capsule, 10 mg (100 % of original dose 10 mg), Oral, Daily, 1 of 1 cycle, Start date: 11/08/2020, End date: 11/08/2020 Dose modification: 10 mg (original dose 10 mg, Cycle 0) pembrolizumab (KEYTRUDA) 200 mg in sodium chloride 0.9 % 50 mL chemo infusion, 200 mg, Intravenous, Once, 3 of 6 cycles Administration: 200 mg (11/16/2020), 200 mg (01/04/2021), 200 mg (12/07/2020)  for chemotherapy treatment.      REVIEW OF SYSTEMS:   Constitutional: Denies fevers, chills or abnormal weight loss Eyes: Denies blurriness of vision Ears, nose, mouth, throat, and face: Denies mucositis or sore throat Respiratory: Denies cough, dyspnea or wheezes Cardiovascular: Denies palpitation, chest discomfort or lower extremity swelling Skin: Denies abnormal skin rashes Lymphatics: Denies new lymphadenopathy or easy bruising Neurological:Denies numbness, tingling or new weaknesses Behavioral/Psych: Mood is stable, no new changes  All other systems were reviewed with the patient and are negative.  I have reviewed the past medical history, past surgical history, social history and family history with the patient and they are  unchanged from previous note.  ALLERGIES:  is allergic to benicar hct [olmesartan medoxomil-hctz], bactrim [sulfamethoxazole-trimethoprim], pravastatin, shellfish allergy, amlodipine, and iodinated diagnostic agents.  MEDICATIONS:  Current Outpatient Medications  Medication Sig Dispense Refill  . predniSONE (DELTASONE) 50 MG tablet Take 1 pill at 13 hours, 7 hours and 1 hour before CT scan 3 tablet 0  . acetaminophen (TYLENOL) 500 MG tablet Take 1,000 mg by mouth every 6 (six) hours as needed for moderate pain.    Marland Kitchen ALPRAZolam (XANAX) 0.25 MG tablet Take 1 tablet (0.25 mg total) by mouth 2 (two) times daily as needed for anxiety. 30 tablet 0  . APPLE CIDER VINEGAR PO Take 5 mLs by mouth daily.     Marland Kitchen aspirin EC 81 MG tablet Take 81 mg by mouth daily. Swallow whole.    Marland Kitchen atenolol (TENORMIN) 25 MG tablet Take 1 tablet (  25 mg total) by mouth daily. 30 tablet 3  . b complex vitamins tablet Take 1 tablet by mouth daily.    . Blood Pressure Monitoring (BLOOD PRESSURE KIT) DEVI 1 Units by Does not apply route every morning. 1 each 0  . calcium-vitamin D (OSCAL WITH D) 500-200 MG-UNIT tablet Take 1 tablet by mouth daily with breakfast.     . chlorthalidone (HYGROTON) 25 MG tablet Take 0.5 tablets (12.5 mg total) by mouth daily. 90 tablet 1  . diphenhydrAMINE (BENADRYL) 25 MG tablet Take 25 mg by mouth every 6 (six) hours as needed for allergies.    . famotidine (PEPCID) 20 MG tablet Take 20 mg by mouth 2 (two) times daily as needed for heartburn or indigestion.    Marland Kitchen lenvatinib 10 mg daily dose (LENVIMA, 10 MG DAILY DOSE,) capsule Take 1 capsule (10 mg total) by mouth daily. 30 capsule 11  . lisinopril (ZESTRIL) 5 MG tablet Take 1 tablet (5 mg total) by mouth daily. 30 tablet 11  . loratadine (CLARITIN) 10 MG tablet Take 10 mg by mouth daily as needed for allergies.    . Multiple Vitamin (MULTIVITAMIN) capsule Take 1 capsule by mouth daily.    . ondansetron (ZOFRAN) 8 MG tablet Take 1 tablet (8 mg  total) by mouth every 8 (eight) hours as needed. Start on the third day after chemotherapy. 30 tablet 1  . oxyCODONE (OXY IR/ROXICODONE) 5 MG immediate release tablet Take 1 tablet (5 mg total) by mouth every 4 (four) hours as needed for severe pain. 30 tablet 0  . polyethylene glycol (MIRALAX / GLYCOLAX) 17 g packet Take 17 g by mouth daily as needed for mild constipation.    . prochlorperazine (COMPAZINE) 10 MG tablet Take 1 tablet (10 mg total) by mouth every 6 (six) hours as needed (Nausea or vomiting). 30 tablet 1   No current facility-administered medications for this visit.    PHYSICAL EXAMINATION: ECOG PERFORMANCE STATUS: 1 - Symptomatic but completely ambulatory  Vitals:   01/25/21 0930  BP: 126/73  Pulse: 83  Resp: 18  Temp: 97.8 F (36.6 C)  SpO2: 100%   Filed Weights   01/25/21 0930  Weight: 190 lb (86.2 kg)    GENERAL:alert, no distress and comfortable SKIN: skin color, texture, turgor are normal, no rashes or significant lesions EYES: normal, Conjunctiva are pink and non-injected, sclera clear OROPHARYNX:no exudate, no erythema and lips, buccal mucosa, and tongue normal  NECK: supple, thyroid normal size, non-tender, without nodularity LYMPH:  no palpable lymphadenopathy in the cervical, axillary or inguinal LUNGS: clear to auscultation and percussion with normal breathing effort HEART: regular rate & rhythm and no murmurs and no lower extremity edema ABDOMEN:abdomen soft, non-tender and normal bowel sounds Musculoskeletal:no cyanosis of digits and no clubbing  NEURO: alert & oriented x 3 with fluent speech, no focal motor/sensory deficits  LABORATORY DATA:  I have reviewed the data as listed    Component Value Date/Time   NA 137 01/25/2021 0849   NA 140 07/04/2017 0952   K 3.8 01/25/2021 0849   CL 99 01/25/2021 0849   CO2 28 01/25/2021 0849   GLUCOSE 106 (H) 01/25/2021 0849   BUN 9 01/25/2021 0849   BUN 10 07/04/2017 0952   CREATININE 0.80 01/25/2021  0849   CREATININE 0.81 03/23/2015 0946   CALCIUM 9.3 01/25/2021 0849   PROT 6.9 01/25/2021 0849   ALBUMIN 3.4 (L) 01/25/2021 0849   AST 22 01/25/2021 0849   ALT 21 01/25/2021  0849   ALKPHOS 84 01/25/2021 0849   BILITOT 0.5 01/25/2021 0849   GFRNONAA >60 01/25/2021 0849   GFRNONAA 78 03/23/2015 0946   GFRAA >60 09/12/2020 0853   GFRAA >89 03/23/2015 0946    No results found for: SPEP, UPEP  Lab Results  Component Value Date   WBC 5.4 01/25/2021   NEUTROABS 3.3 01/25/2021   HGB 12.6 01/25/2021   HCT 38.8 01/25/2021   MCV 74.8 (L) 01/25/2021   PLT 206 01/25/2021      Chemistry      Component Value Date/Time   NA 137 01/25/2021 0849   NA 140 07/04/2017 0952   K 3.8 01/25/2021 0849   CL 99 01/25/2021 0849   CO2 28 01/25/2021 0849   BUN 9 01/25/2021 0849   BUN 10 07/04/2017 0952   CREATININE 0.80 01/25/2021 0849   CREATININE 0.81 03/23/2015 0946      Component Value Date/Time   CALCIUM 9.3 01/25/2021 0849   ALKPHOS 84 01/25/2021 0849   AST 22 01/25/2021 0849   ALT 21 01/25/2021 0849   BILITOT 0.5 01/25/2021 0849

## 2021-01-26 NOTE — Assessment & Plan Note (Signed)
She tolerated treatment very poorly Every visit is accompanied by an ER visit a few days later despite my best effort to manage her symptoms The patient never call us and will go straight to the ER whenever she felt that Most recently, when she developed some abdominal pain and diarrhea, she stopped Lenvima along with all her blood pressure medications and felt better I am concerned whether her recent abdominal pain could represent disease progression In any case, I do not feel strongly she is a good candidate to continue on current regimen I recommend we discontinue her treatment today and proceed with CT imaging next week for objective assessment of response to therapy If she developed complete response, potentially she can stop her treatment and be observed However, if she develop evidence of disease progression, we will have to switch her to something else She is in agreement with the plan of care

## 2021-01-26 NOTE — Telephone Encounter (Signed)
Called and scheduled appt with Dr. Alvy Bimler on 2/3 at 2:20 pm. She is aware of appt time.

## 2021-01-26 NOTE — Assessment & Plan Note (Signed)
She has intermittent abdominal pain which she attributed that to side effects of medication She did not take her pain medicine recently As above, I plan to order CT imaging for further follow-up

## 2021-01-26 NOTE — Assessment & Plan Note (Signed)
She has poorly controlled hypertension with very frequent ER visits whenever she felt symptomatic Since her discontinue all her treatment, her blood pressure is normal today I will observe for now as above If we discontinue Lenvima permanently, we can probably stop all her blood pressure medications permanently

## 2021-01-28 ENCOUNTER — Emergency Department (HOSPITAL_COMMUNITY): Payer: Medicare Other

## 2021-01-28 ENCOUNTER — Other Ambulatory Visit: Payer: Self-pay

## 2021-01-28 ENCOUNTER — Emergency Department (HOSPITAL_COMMUNITY)
Admission: EM | Admit: 2021-01-28 | Discharge: 2021-01-28 | Disposition: A | Discharge status: Home / Self Care | Payer: Medicare Other | Attending: Emergency Medicine | Admitting: Emergency Medicine

## 2021-01-28 ENCOUNTER — Encounter (HOSPITAL_COMMUNITY): Payer: Self-pay

## 2021-01-28 DIAGNOSIS — Z79899 Other long term (current) drug therapy: Secondary | ICD-10-CM | POA: Insufficient documentation

## 2021-01-28 DIAGNOSIS — E876 Hypokalemia: Secondary | ICD-10-CM | POA: Diagnosis not present

## 2021-01-28 DIAGNOSIS — I4891 Unspecified atrial fibrillation: Secondary | ICD-10-CM | POA: Diagnosis not present

## 2021-01-28 DIAGNOSIS — Z8542 Personal history of malignant neoplasm of other parts of uterus: Secondary | ICD-10-CM | POA: Diagnosis not present

## 2021-01-28 DIAGNOSIS — Z7901 Long term (current) use of anticoagulants: Secondary | ICD-10-CM | POA: Insufficient documentation

## 2021-01-28 DIAGNOSIS — R0902 Hypoxemia: Secondary | ICD-10-CM | POA: Diagnosis not present

## 2021-01-28 DIAGNOSIS — I10 Essential (primary) hypertension: Secondary | ICD-10-CM | POA: Diagnosis not present

## 2021-01-28 DIAGNOSIS — R002 Palpitations: Secondary | ICD-10-CM | POA: Diagnosis not present

## 2021-01-28 DIAGNOSIS — R000 Tachycardia, unspecified: Secondary | ICD-10-CM | POA: Diagnosis not present

## 2021-01-28 DIAGNOSIS — I471 Supraventricular tachycardia: Secondary | ICD-10-CM | POA: Diagnosis not present

## 2021-01-28 DIAGNOSIS — R0602 Shortness of breath: Secondary | ICD-10-CM | POA: Diagnosis not present

## 2021-01-28 DIAGNOSIS — Z7982 Long term (current) use of aspirin: Secondary | ICD-10-CM | POA: Insufficient documentation

## 2021-01-28 LAB — CBC
HCT: 37 % (ref 36.0–46.0)
Hemoglobin: 12.3 g/dL (ref 12.0–15.0)
MCH: 24.9 pg — ABNORMAL LOW (ref 26.0–34.0)
MCHC: 33.2 g/dL (ref 30.0–36.0)
MCV: 74.9 fL — ABNORMAL LOW (ref 80.0–100.0)
Platelets: 206 10*3/uL (ref 150–400)
RBC: 4.94 MIL/uL (ref 3.87–5.11)
RDW: 17.6 % — ABNORMAL HIGH (ref 11.5–15.5)
WBC: 4.9 10*3/uL (ref 4.0–10.5)
nRBC: 0 % (ref 0.0–0.2)

## 2021-01-28 LAB — APTT: aPTT: 21 seconds — ABNORMAL LOW (ref 24–36)

## 2021-01-28 LAB — PROTIME-INR
INR: 1 (ref 0.8–1.2)
Prothrombin Time: 13.1 seconds (ref 11.4–15.2)

## 2021-01-28 LAB — BASIC METABOLIC PANEL
Anion gap: 15 (ref 5–15)
BUN: 7 mg/dL — ABNORMAL LOW (ref 8–23)
CO2: 20 mmol/L — ABNORMAL LOW (ref 22–32)
Calcium: 9 mg/dL (ref 8.9–10.3)
Chloride: 100 mmol/L (ref 98–111)
Creatinine, Ser: 0.95 mg/dL (ref 0.44–1.00)
GFR, Estimated: >60 mL/min (ref >60)
Glucose, Bld: 174 mg/dL — ABNORMAL HIGH (ref 70–99)
Potassium: 2.9 mmol/L — ABNORMAL LOW (ref 3.5–5.1)
Sodium: 135 mmol/L (ref 135–145)

## 2021-01-28 LAB — MAGNESIUM: Magnesium: 1.5 mg/dL — ABNORMAL LOW (ref 1.7–2.4)

## 2021-01-28 MED ORDER — APIXABAN 5 MG PO TABS: 5.0000 mg | ORAL_TABLET | Freq: Two times a day (BID) | ORAL | 0 refills | Status: DC

## 2021-01-28 MED ORDER — POTASSIUM CHLORIDE 10 MEQ/100ML IV SOLN
10.0000 meq | Freq: Once | INTRAVENOUS | Status: AC
  Administered 2021-01-28: 10 meq via INTRAVENOUS
  Filled 2021-01-28: qty 100

## 2021-01-28 MED ORDER — LORAZEPAM 2 MG/ML IJ SOLN
0.5000 mg | Freq: Once | INTRAMUSCULAR | Status: AC
  Administered 2021-01-28: 0.5 mg via INTRAVENOUS
  Filled 2021-01-28: qty 1

## 2021-01-28 MED ORDER — ATENOLOL 25 MG PO TABS
25.0000 mg | ORAL_TABLET | Freq: Once | ORAL | Status: AC
  Administered 2021-01-28: 25 mg via ORAL
  Filled 2021-01-28: qty 1

## 2021-01-28 MED ORDER — APIXABAN 5 MG PO TABS
5.0000 mg | ORAL_TABLET | Freq: Once | ORAL | Status: AC
  Administered 2021-01-28: 5 mg via ORAL
  Filled 2021-01-28: qty 1

## 2021-01-28 MED ORDER — MAGNESIUM SULFATE 2 GM/50ML IV SOLN
2.0000 g | Freq: Once | INTRAVENOUS | Status: AC
  Administered 2021-01-28: 2 g via INTRAVENOUS
  Filled 2021-01-28: qty 50

## 2021-01-28 MED ORDER — POTASSIUM CHLORIDE CRYS ER 10 MEQ PO TBCR
40.0000 meq | EXTENDED_RELEASE_TABLET | Freq: Once | ORAL | Status: AC
  Administered 2021-01-28: 40 meq via ORAL
  Filled 2021-01-28: qty 4

## 2021-01-28 MED ORDER — ATENOLOL 25 MG PO TABS: 25.0000 mg | ORAL_TABLET | Freq: Every day | ORAL | 0 refills | Status: DC

## 2021-01-28 NOTE — ED Triage Notes (Signed)
Pt comes via [facility], palpations woke her from her sleep, taken off her atenolol two days ago, pt originally in SVT at rate or 180-190, tried vagal manuvers, given 6, then 12 and 12mg  of adenosine, pt then in afib given total 21mg  Cardizem (two 10mg  push and then started on drip), pt converted at the bridge to ST.

## 2021-01-28 NOTE — ED Notes (Signed)
Pt HR climbing into the 120's and pt states some sob.  She states she is anxious.  EKG given to MD per his request.

## 2021-01-28 NOTE — ED Notes (Signed)
Lenis Dickinson Sr.,  pt brother,called to get update please call  [phone]

## 2021-01-28 NOTE — ED Notes (Signed)
HR in 90's.  MD states ok to discharge pt.

## 2021-01-28 NOTE — Discharge Instructions (Addendum)
Please restart your atenolol for now to help your heart rate and blood pressure.  You should be getting contacted by the atrial fibrillation clinic

## 2021-01-28 NOTE — ED Provider Notes (Signed)
EKG Interpretation  Date/Time:  Saturday January 28 2021 07:26:42 EST Ventricular Rate:  122 PR Interval:    QRS Duration: 76 QT Interval:  310 QTC Calculation: 442 R Axis:   54 Text Interpretation: Sinus tachycardia Probable left atrial enlargement Confirmed by Ripley Fraise [phone]) on 01/28/2021 7:30:28 AM      Patient still receiving medications.  She reports feeling anxious and mildly tachycardic. EKG reveals mild sinus tachycardia.  Patient feels improved, talking on the phone.  Will give a dose of her home atenolol, and since she reports anxiety will give a dose of Ativan.  I feel she is otherwise appropriate for discharge home after meds are complete   Ripley Fraise, MD 01/28/21 [phone]

## 2021-01-28 NOTE — ED Provider Notes (Signed)
[facility] EMERGENCY DEPARTMENT Provider Note   CSN: [id number] Arrival date & time: 01/28/21  0431     History Chief Complaint  Patient presents with  . Atrial Fibrillation    Tina Stone is a 71 y.o. female.  The history is provided by the patient and the EMS personnel.  Palpitations Palpitations quality:  Fast Onset quality:  Sudden Timing:  Constant Progression:  Improving Chronicity:  New Relieved by: medications. Worsened by:  Nothing Associated symptoms: shortness of breath   Associated symptoms: no chest pain and no vomiting   Risk factors: no heart disease, no hx of atrial fibrillation and no hx of PE       Patient with history of endometrial cancer, hypertension presents with palpitations.  Patient reports she woke up and her heart was racing and she felt short of breath.  Denies any chest pain.  She denies any known history of atrial fibrillation.  Denies any history of CAD/VTE  EMS reports the patient was initially in SVT with a fast heart rate close to 200.  Patient was given adenosine multiple times and was noted to be in atrial fibrillation.  Patient was started on Cardizem and she spontaneously converted to sinus tachycardia.  Patient is feeling improved  She was recently on atenolol but stopped this due to gastrointestinal upset. Past Medical History:  Diagnosis Date  . Adenomatous colon polyp   . Allergic rhinitis, seasonal   . Allergy   . Beta thalassemia trait 11/25/2013  . Cholelithiasis   . Class 3 obesity without serious comorbidity with body mass index (BMI) of 40.0 to 44.9 in adult 11/19/2012  . endometrial ca dx'd 08/2009   endometrial   . GERD (gastroesophageal reflux disease)   . HLD (hyperlipidemia)   . Hypercholesterolemia   . Hypertension 03/18/2017   no meds   . Intraductal papilloma of left breast    Patient underwent left needle-localized lumpectomy by Dr. Imogene Burn. Tsuei on 09/09/2013; pathology showed  intraductal papilloma with no atypia or malignancy identified.  Marland Kitchen PONV (postoperative nausea and vomiting)   . Pre-diabetes    pt denies  . Uterine fibroid     Patient Active Problem List   Diagnosis Date Noted  . Fall 11/29/2020  . Mild anxiety 11/28/2020  . Essential hypertension 11/16/2020  . Cancer associated pain 11/08/2020  . Vitamin D deficiency 10/27/2020  . Pancytopenia, acquired ([place]) 09/12/2020  . Skin rash 08/17/2020  . Metastasis to liver ([place]) 06/14/2020  . Bone pain 05/27/2020  . Goals of care, counseling/discussion 05/16/2020  . Endometrial cancer ([place]) 10/22/2019  . Postmenopausal bleeding 09/02/2019  . Biliary colic 36/62/[id number]  . Breast pain, left 05/20/2019  . Trapezius muscle spasm 05/11/2019  . Elevated random blood glucose level 02/13/2019  . Epigastric pain 01/29/2019  . Prediabetes 03/18/2017  . Hypertension 03/18/2017  . Beta thalassemia trait 11/25/2013  . Class II obesity 11/19/2012  . Women's annual routine gynecological examination 07/12/2011  . Gastroesophageal reflux disease 01/28/2007  . Hyperlipidemia 10/10/2006    Past Surgical History:  Procedure Laterality Date  . [place]   right  . BREAST EXCISIONAL BIOPSY Left 2014   benign  . BREAST LUMPECTOMY WITH NEEDLE LOCALIZATION Left 09/09/2013   Procedure: BREAST LUMPECTOMY WITH NEEDLE LOCALIZATION;  Surgeon: Imogene Burn. Georgette Dover, MD;  Location: [facility];  Service: General;  Laterality: Left;  . CHOLECYSTECTOMY    . COLONOSCOPY    . IR IMAGING GUIDED PORT INSERTION  05/18/2020  . POLYPECTOMY    . ROBOTIC ASSISTED LAPAROSCOPIC CHOLECYSTECTOMY  09/09/2019  . ROBOTIC ASSISTED TOTAL HYSTERECTOMY WITH BILATERAL SALPINGO OOPHERECTOMY N/A 10/22/2019   Procedure: XI ROBOTIC ASSISTED TOTAL HYSTERECTOMY WITH BILATERAL SALPINGO OOPHORECTOMY GREATER THAN 250 GRAMS, MINI LAPARTOMY FOR SPECIMEN DELIVERY; PELVIC AND PERI-AORTIC LYMPHADENECTOMY;  Surgeon: Everitt Amber, MD;  Location: [facility];   Service: Gynecology;  Laterality: N/A;  . SENTINEL NODE BIOPSY N/A 10/22/2019   Procedure: SENTINEL NODE BIOPSY;  Surgeon: Everitt Amber, MD;  Location: [facility];  Service: Gynecology;  Laterality: N/A;     OB History    Gravida  1   Para  0   Term  0   Preterm  0   AB  1   Living  0     SAB  0   IAB  1   Ectopic  0   Multiple  0   Live Births  0           Family History  Problem Relation Age of Onset  . Diabetes Mother   . Hypertension Mother   . Colon polyps Mother 58  . Dementia Mother 90  . Diabetes Father   . Congestive Heart Failure Father   . Pancreatic cancer Paternal Aunt   . Colon cancer Neg Hx   . Breast cancer Neg Hx   . Lung cancer Neg Hx   . Esophageal cancer Neg Hx   . Rectal cancer Neg Hx   . Stomach cancer Neg Hx     Social History   Tobacco Use  . Smoking status: Never Smoker  . Smokeless tobacco: Never Used  . Tobacco comment: few puffs but not a true smoker quit many yrs ago  Vaping Use  . Vaping Use: Never used  Substance Use Topics  . Alcohol use: Never  . Drug use: Never    Home Medications Prior to Admission medications   Medication Sig Start Date End Date Taking? Authorizing Provider  acetaminophen (TYLENOL) 500 MG tablet Take 1,000 mg by mouth every 6 (six) hours as needed for moderate pain.    [provider]  ALPRAZolam Duanne Moron) 0.25 MG tablet Take 1 tablet (0.25 mg total) by mouth 2 (two) times daily as needed for anxiety. 11/28/20   [name], MD  APPLE CIDER VINEGAR PO Take 5 mLs by mouth daily.     [provider]  aspirin EC 81 MG tablet Take 81 mg by mouth daily. Swallow whole.    [provider]  atenolol (TENORMIN) 25 MG tablet Take 1 tablet (25 mg total) by mouth daily. 11/28/20   [name], MD  b complex vitamins tablet Take 1 tablet by mouth daily.    [provider]  Blood Pressure Monitoring (BLOOD PRESSURE KIT) DEVI 1 Units by Does not apply route every morning. 07/01/20    Raylene Everts, MD  calcium-vitamin D (OSCAL WITH D) 500-200 MG-UNIT tablet Take 1 tablet by mouth daily with breakfast.     [provider]  chlorthalidone (HYGROTON) 25 MG tablet Take 0.5 tablets (12.5 mg total) by mouth daily. 01/05/21   Isaac Bliss, Rayford Halsted, MD  diphenhydrAMINE (BENADRYL) 25 MG tablet Take 25 mg by mouth every 6 (six) hours as needed for allergies.    [provider]  famotidine (PEPCID) 20 MG tablet Take 20 mg by mouth 2 (two) times daily as needed for heartburn or indigestion.    [provider]  lenvatinib 10 mg daily dose ([place],  10 MG DAILY DOSE,) capsule Take 1 capsule (10 mg total) by mouth daily. 11/08/20   [name], MD  lisinopril (ZESTRIL) 5 MG tablet Take 1 tablet (5 mg total) by mouth daily. 01/04/21   [name], MD  loratadine (CLARITIN) 10 MG tablet Take 10 mg by mouth daily as needed for allergies.    [provider]  Multiple Vitamin (MULTIVITAMIN) capsule Take 1 capsule by mouth daily.    [provider]  ondansetron (ZOFRAN) 8 MG tablet Take 1 tablet (8 mg total) by mouth every 8 (eight) hours as needed. Start on the third day after chemotherapy. 05/16/20   [name], MD  oxyCODONE (OXY IR/ROXICODONE) 5 MG immediate release tablet Take 1 tablet (5 mg total) by mouth every 4 (four) hours as needed for severe pain. 11/08/20   [name], MD  polyethylene glycol (MIRALAX / GLYCOLAX) 17 g packet Take 17 g by mouth daily as needed for mild constipation.    [provider]  predniSONE (DELTASONE) 50 MG tablet Take 1 pill at 13 hours, 7 hours and 1 hour before CT scan 01/25/21   [name], MD  prochlorperazine (COMPAZINE) 10 MG tablet Take 1 tablet (10 mg total) by mouth every 6 (six) hours as needed (Nausea or vomiting). 05/16/20   [name], MD  atorvastatin (LIPITOR) 20 MG tablet Take 1 tablet (20 mg total) by mouth daily. 05/03/20 07/01/20  Isaac Bliss, Rayford Halsted, MD    Allergies    Benicar  hct [olmesartan medoxomil-hctz], Bactrim [sulfamethoxazole-trimethoprim], Pravastatin, Shellfish allergy, Amlodipine, and Iodinated diagnostic agents  Review of Systems   Review of Systems  Respiratory: Positive for shortness of breath.   Cardiovascular: Positive for palpitations. Negative for chest pain.  Gastrointestinal: Negative for blood in stool and vomiting.  Genitourinary: Negative for hematuria and vaginal bleeding.  All other systems reviewed and are negative.   Physical Exam Updated Vital Signs BP (!) 142/75   Pulse (!) 106   Temp 97.7 F (36.5 C) (Oral)   Resp (!) 22   SpO2 100%   Physical Exam CONSTITUTIONAL: Well developed/well nourished HEAD: Normocephalic/atraumatic EYES: EOMI/PERRL ENMT: Mucous membranes moist NECK: supple no meningeal signs SPINE/BACK:entire spine nontender CV: S1/S2 noted, no murmurs/rubs/gallops noted, tachycardic LUNGS: Lungs are clear to auscultation bilaterally, no apparent distress ABDOMEN: soft, nontender, no rebound or guarding, bowel sounds noted throughout abdomen GU:no cva tenderness NEURO: Pt is awake/alert/appropriate, moves all extremitiesx4.  No facial droop.   EXTREMITIES: pulses normal/equal, full ROM SKIN: warm, color normal PSYCH: no abnormalities of mood noted, alert and oriented to situation  ED Results / Procedures / Treatments   Labs (all labs ordered are listed, but only abnormal results are displayed) Labs Reviewed  BASIC METABOLIC PANEL - Abnormal; Notable for the following components:      Result Value   Potassium 2.9 (*)    CO2 20 (*)    Glucose, Bld 174 (*)    BUN 7 (*)    All other components within normal limits  MAGNESIUM - Abnormal; Notable for the following components:   Magnesium 1.5 (*)    All other components within normal limits  CBC - Abnormal; Notable for the following components:   MCV 74.9 (*)    MCH 24.9 (*)    RDW 17.6 (*)    All other components within normal limits  APTT - Abnormal;  Notable for the following components:   aPTT 21 (*)    All other components within normal limits  PROTIME-INR    EKG ED ECG REPORT   Date: 01/28/2021 0445am  Rate: 91  Rhythm: normal sinus rhythm  QRS Axis: normal  Intervals: normal  ST/T Wave abnormalities: nonspecific ST changes  Conduction Disutrbances:none  Narrative Interpretation:   Old EKG Reviewed: unchanged  I have personally reviewed the EKG tracing and agree with the computerized printout as noted.  prehospital EKG     Radiology No results found.  Procedures Procedures   Medications Ordered in ED Medications  potassium chloride 10 mEq in 100 mL IVPB (has no administration in time range)  magnesium sulfate IVPB 2 g 50 mL (has no administration in time range)  potassium chloride (KLOR-CON) CR tablet 40 mEq (has no administration in time range)  apixaban (ELIQUIS) tablet 5 mg (has no administration in time range)    ED Course  I have reviewed the triage vital signs and the nursing notes.  Pertinent labs & imaging results that were available during my care of the patient were reviewed by me and considered in my medical decision making (see chart for details).    MDM Rules/Calculators/A&P                          4:58 AM Patient presents for palpitations.  She was noted to have atrial fibrillation after being given adenosine.  She converted to sinus rhythm with Cardizem This appears to be a new diagnosis for her. Example of prehospital EKG is above that shows artifact but could be consistent with atrial fibrillation. Labs and x-rays are pending at this time 6:21 AM Patient feels improved and is ambulatory.  She does have mild hypokalemia and hypomagnesemia. Due to paroxysmal atrial fibrillation, will start Eliquis.  She has been seen by pharmacy. Will refer to the atrial fibrillation clinic. She denies any recent bleeding issues.  We will also ask her to restart her atenolol. Patient is otherwise  stable for discharge after she receives her medications   This patients CHA2DS2-VASc Score and unadjusted Ischemic Stroke Rate (% per year) is equal to 3.2 % stroke rate/year from a score of 3  Above score calculated as 1 point each if present [CHF, HTN, DM, Vascular=MI/PAD/Aortic Plaque, Age if 65-74, or Female] Above score calculated as 2 points each if present [Age > 75, or Stroke/TIA/TE]   Final Clinical Impression(s) / ED Diagnoses Final diagnoses:  Atrial fibrillation with RVR ([place])  Hypokalemia  Hypomagnesemia    Rx / DC Orders ED Discharge Orders         Ordered    Amb referral to AFIB Clinic        01/28/21 0446           Ripley Fraise, MD 01/28/21 [phone]

## 2021-01-29 ENCOUNTER — Encounter (HOSPITAL_COMMUNITY): Payer: Self-pay | Admitting: Emergency Medicine

## 2021-01-29 ENCOUNTER — Emergency Department (HOSPITAL_COMMUNITY)
Admission: EM | Admit: 2021-01-29 | Discharge: 2021-01-29 | Disposition: A | Discharge status: Home / Self Care | Payer: Medicare Other | Attending: Emergency Medicine | Admitting: Emergency Medicine

## 2021-01-29 ENCOUNTER — Emergency Department (HOSPITAL_COMMUNITY): Payer: Medicare Other

## 2021-01-29 ENCOUNTER — Other Ambulatory Visit: Payer: Self-pay

## 2021-01-29 DIAGNOSIS — I10 Essential (primary) hypertension: Secondary | ICD-10-CM | POA: Insufficient documentation

## 2021-01-29 DIAGNOSIS — R42 Dizziness and giddiness: Secondary | ICD-10-CM | POA: Insufficient documentation

## 2021-01-29 DIAGNOSIS — R0602 Shortness of breath: Secondary | ICD-10-CM | POA: Insufficient documentation

## 2021-01-29 DIAGNOSIS — Z5321 Procedure and treatment not carried out due to patient leaving prior to being seen by health care provider: Secondary | ICD-10-CM | POA: Insufficient documentation

## 2021-01-29 LAB — BASIC METABOLIC PANEL
Anion gap: 13 (ref 5–15)
BUN: 6 mg/dL — ABNORMAL LOW (ref 8–23)
CO2: 22 mmol/L (ref 22–32)
Calcium: 9.2 mg/dL (ref 8.9–10.3)
Chloride: 101 mmol/L (ref 98–111)
Creatinine, Ser: 0.87 mg/dL (ref 0.44–1.00)
GFR, Estimated: >60 mL/min (ref >60)
Glucose, Bld: 118 mg/dL — ABNORMAL HIGH (ref 70–99)
Potassium: 3.8 mmol/L (ref 3.5–5.1)
Sodium: 136 mmol/L (ref 135–145)

## 2021-01-29 LAB — CBC
HCT: 38.1 % (ref 36.0–46.0)
Hemoglobin: 12.5 g/dL (ref 12.0–15.0)
MCH: 24.7 pg — ABNORMAL LOW (ref 26.0–34.0)
MCHC: 32.8 g/dL (ref 30.0–36.0)
MCV: 75.1 fL — ABNORMAL LOW (ref 80.0–100.0)
Platelets: 241 10*3/uL (ref 150–400)
RBC: 5.07 MIL/uL (ref 3.87–5.11)
RDW: 18 % — ABNORMAL HIGH (ref 11.5–15.5)
WBC: 4.2 10*3/uL (ref 4.0–10.5)
nRBC: 0 % (ref 0.0–0.2)

## 2021-01-29 LAB — TROPONIN I (HIGH SENSITIVITY): Troponin I (High Sensitivity): 14 ng/L (ref <18)

## 2021-01-29 NOTE — ED Triage Notes (Signed)
Pt seen in ED yesterday for Afib with RVR.  Reports BP 154/103 this morning.  Took AM meds.  Also reports SOB and dizziness.  Denies pain.

## 2021-01-29 NOTE — ED Notes (Signed)
Pt called x3 for vitals no response

## 2021-01-29 NOTE — ED Notes (Signed)
Called X3 for vitals no answer

## 2021-01-30 ENCOUNTER — Other Ambulatory Visit: Payer: Self-pay

## 2021-01-30 ENCOUNTER — Emergency Department (HOSPITAL_COMMUNITY)
Admission: EM | Admit: 2021-01-30 | Discharge: 2021-01-30 | Disposition: A | Discharge status: Home / Self Care | Payer: Medicare Other | Attending: Emergency Medicine | Admitting: Emergency Medicine

## 2021-01-30 DIAGNOSIS — Z7901 Long term (current) use of anticoagulants: Secondary | ICD-10-CM | POA: Insufficient documentation

## 2021-01-30 DIAGNOSIS — K625 Hemorrhage of anus and rectum: Secondary | ICD-10-CM | POA: Diagnosis not present

## 2021-01-30 DIAGNOSIS — Z8542 Personal history of malignant neoplasm of other parts of uterus: Secondary | ICD-10-CM | POA: Diagnosis not present

## 2021-01-30 DIAGNOSIS — Z79899 Other long term (current) drug therapy: Secondary | ICD-10-CM | POA: Insufficient documentation

## 2021-01-30 DIAGNOSIS — I10 Essential (primary) hypertension: Secondary | ICD-10-CM | POA: Insufficient documentation

## 2021-01-30 DIAGNOSIS — Z7982 Long term (current) use of aspirin: Secondary | ICD-10-CM | POA: Insufficient documentation

## 2021-01-30 LAB — COMPREHENSIVE METABOLIC PANEL
ALT: 14 U/L (ref 0–44)
AST: 20 U/L (ref 15–41)
Albumin: 3.4 g/dL — ABNORMAL LOW (ref 3.5–5.0)
Alkaline Phosphatase: 71 U/L (ref 38–126)
Anion gap: 13 (ref 5–15)
BUN: 6 mg/dL — ABNORMAL LOW (ref 8–23)
CO2: 21 mmol/L — ABNORMAL LOW (ref 22–32)
Calcium: 9.3 mg/dL (ref 8.9–10.3)
Chloride: 103 mmol/L (ref 98–111)
Creatinine, Ser: 0.91 mg/dL (ref 0.44–1.00)
GFR, Estimated: >60 mL/min (ref >60)
Glucose, Bld: 116 mg/dL — ABNORMAL HIGH (ref 70–99)
Potassium: 3.7 mmol/L (ref 3.5–5.1)
Sodium: 137 mmol/L (ref 135–145)
Total Bilirubin: 0.6 mg/dL (ref 0.3–1.2)
Total Protein: 6.5 g/dL (ref 6.5–8.1)

## 2021-01-30 LAB — CBC
HCT: 36.3 % (ref 36.0–46.0)
Hemoglobin: 12 g/dL (ref 12.0–15.0)
MCH: 24.9 pg — ABNORMAL LOW (ref 26.0–34.0)
MCHC: 33.1 g/dL (ref 30.0–36.0)
MCV: 75.5 fL — ABNORMAL LOW (ref 80.0–100.0)
Platelets: 238 10*3/uL (ref 150–400)
RBC: 4.81 MIL/uL (ref 3.87–5.11)
RDW: 17.9 % — ABNORMAL HIGH (ref 11.5–15.5)
WBC: 5 10*3/uL (ref 4.0–10.5)
nRBC: 0 % (ref 0.0–0.2)

## 2021-01-30 LAB — TYPE AND SCREEN
ABO/RH(D): B NEG
Antibody Screen: NEGATIVE

## 2021-01-30 LAB — POC OCCULT BLOOD, ED: Fecal Occult Bld: NEGATIVE

## 2021-01-30 NOTE — ED Triage Notes (Signed)
Pt from home for eval of blood in stool noted this morning after starting eliquis two days ago for afib,. Denies abdominal pain.

## 2021-01-30 NOTE — ED Provider Notes (Signed)
[facility] EMERGENCY DEPARTMENT Provider Note  CSN: [id number] Arrival date & time: 01/30/21 0847    History Chief Complaint  Patient presents with  . GI Bleeding    HPI  Tina Stone is a 71 y.o. female recently seen in the ED for tachycardia, eventually found to be atrial fibrillation. She converted to sinus rhythm during her ED visit and was discharged with Rx for Eliquis. She was given a dose in the ED 2 days ago, took her first dose at home yesterday and again this morning. She noted some blood on her tissue and in the toilet water after having a BM this morning. She reports the stool was normal brown without melena or hematochezia. She is otherwise feeling well without any CP, SOB, dizziness or abdominal pain.    Past Medical History:  Diagnosis Date  . Adenomatous colon polyp   . Allergic rhinitis, seasonal   . Allergy   . Beta thalassemia trait 11/25/2013  . Cholelithiasis   . Class 3 obesity without serious comorbidity with body mass index (BMI) of 40.0 to 44.9 in adult 11/19/2012  . endometrial ca dx'd 08/2009   endometrial   . GERD (gastroesophageal reflux disease)   . HLD (hyperlipidemia)   . Hypercholesterolemia   . Hypertension 03/18/2017   no meds   . Intraductal papilloma of left breast    Patient underwent left needle-localized lumpectomy by Dr. Imogene Burn. Tsuei on 09/09/2013; pathology showed intraductal papilloma with no atypia or malignancy identified.  Marland Kitchen PONV (postoperative nausea and vomiting)   . Pre-diabetes    pt denies  . Uterine fibroid     Past Surgical History:  Procedure Laterality Date  . [place]   right  . BREAST EXCISIONAL BIOPSY Left 2014   benign  . BREAST LUMPECTOMY WITH NEEDLE LOCALIZATION Left 09/09/2013   Procedure: BREAST LUMPECTOMY WITH NEEDLE LOCALIZATION;  Surgeon: Imogene Burn. Georgette Dover, MD;  Location: [facility];  Service: General;  Laterality: Left;  . CHOLECYSTECTOMY    . COLONOSCOPY    . IR IMAGING GUIDED  PORT INSERTION  05/18/2020  . POLYPECTOMY    . ROBOTIC ASSISTED LAPAROSCOPIC CHOLECYSTECTOMY  09/09/2019  . ROBOTIC ASSISTED TOTAL HYSTERECTOMY WITH BILATERAL SALPINGO OOPHERECTOMY N/A 10/22/2019   Procedure: XI ROBOTIC ASSISTED TOTAL HYSTERECTOMY WITH BILATERAL SALPINGO OOPHORECTOMY GREATER THAN 250 GRAMS, MINI LAPARTOMY FOR SPECIMEN DELIVERY; PELVIC AND PERI-AORTIC LYMPHADENECTOMY;  Surgeon: Everitt Amber, MD;  Location: [facility];  Service: Gynecology;  Laterality: N/A;  . SENTINEL NODE BIOPSY N/A 10/22/2019   Procedure: SENTINEL NODE BIOPSY;  Surgeon: Everitt Amber, MD;  Location: [facility];  Service: Gynecology;  Laterality: N/A;    Family History  Problem Relation Age of Onset  . Diabetes Mother   . Hypertension Mother   . Colon polyps Mother 70  . Dementia Mother 13  . Diabetes Father   . Congestive Heart Failure Father   . Pancreatic cancer Paternal Aunt   . Colon cancer Neg Hx   . Breast cancer Neg Hx   . Lung cancer Neg Hx   . Esophageal cancer Neg Hx   . Rectal cancer Neg Hx   . Stomach cancer Neg Hx     Social History   Tobacco Use  . Smoking status: Never Smoker  . Smokeless tobacco: Never Used  . Tobacco comment: few puffs but not a true smoker quit many yrs ago  Vaping Use  . Vaping Use: Never used  Substance Use Topics  . Alcohol  use: Never  . Drug use: Never     Home Medications Prior to Admission medications   Medication Sig Start Date End Date Taking? Authorizing Provider  acetaminophen (TYLENOL) 500 MG tablet Take 1,000 mg by mouth every 6 (six) hours as needed for moderate pain.    [provider]  ALPRAZolam Duanne Moron) 0.25 MG tablet Take 1 tablet (0.25 mg total) by mouth 2 (two) times daily as needed for anxiety. 11/28/20   [name], MD  APPLE CIDER VINEGAR PO Take 5 mLs by mouth daily.     [provider]  aspirin EC 81 MG tablet Take 81 mg by mouth daily. Swallow whole.    [provider]  atenolol (TENORMIN) 25 MG tablet Take 1  tablet (25 mg total) by mouth daily. 01/28/21   Blanchie Dessert, MD  b complex vitamins tablet Take 1 tablet by mouth daily.    [provider]  Blood Pressure Monitoring (BLOOD PRESSURE KIT) DEVI 1 Units by Does not apply route every morning. 07/01/20   Raylene Everts, MD  calcium-vitamin D (OSCAL WITH D) 500-200 MG-UNIT tablet Take 1 tablet by mouth daily with breakfast.     [provider]  chlorthalidone (HYGROTON) 25 MG tablet Take 0.5 tablets (12.5 mg total) by mouth daily. 01/05/21   Isaac Bliss, Rayford Halsted, MD  diphenhydrAMINE (BENADRYL) 25 MG tablet Take 25 mg by mouth every 6 (six) hours as needed for allergies.    [provider]  famotidine (PEPCID) 20 MG tablet Take 20 mg by mouth 2 (two) times daily as needed for heartburn or indigestion.    [provider]  lenvatinib 10 mg daily dose (LENVIMA, 10 MG DAILY DOSE,) capsule Take 1 capsule (10 mg total) by mouth daily. 11/08/20   [name], MD  lisinopril (ZESTRIL) 5 MG tablet Take 1 tablet (5 mg total) by mouth daily. 01/04/21   [name], MD  loratadine (CLARITIN) 10 MG tablet Take 10 mg by mouth daily as needed for allergies.    [provider]  Multiple Vitamin (MULTIVITAMIN) capsule Take 1 capsule by mouth daily.    [provider]  ondansetron (ZOFRAN) 8 MG tablet Take 1 tablet (8 mg total) by mouth every 8 (eight) hours as needed. Start on the third day after chemotherapy. 05/16/20   [name], MD  oxyCODONE (OXY IR/ROXICODONE) 5 MG immediate release tablet Take 1 tablet (5 mg total) by mouth every 4 (four) hours as needed for severe pain. 11/08/20   [name], MD  polyethylene glycol (MIRALAX / GLYCOLAX) 17 g packet Take 17 g by mouth daily as needed for mild constipation.    [provider]  predniSONE (DELTASONE) 50 MG tablet Take 1 pill at 13 hours, 7 hours and 1 hour before CT scan 01/25/21   [name], MD  prochlorperazine (COMPAZINE) 10 MG tablet Take 1  tablet (10 mg total) by mouth every 6 (six) hours as needed (Nausea or vomiting). 05/16/20   [name], MD  apixaban (ELIQUIS) 5 MG TABS tablet Take 1 tablet (5 mg total) by mouth 2 (two) times daily. 01/28/21 01/30/21  Ripley Fraise, MD  atorvastatin (LIPITOR) 20 MG tablet Take 1 tablet (20 mg total) by mouth daily. 05/03/20 07/01/20  Erline Hau, MD     Allergies    Benicar hct [olmesartan medoxomil-hctz], Bactrim [sulfamethoxazole-trimethoprim], Pravastatin, Shellfish allergy, Amlodipine, and Iodinated diagnostic agents   Review of Systems   Review of Systems A comprehensive review of  systems was completed and negative except as noted in HPI.    Physical Exam BP 139/73   Pulse 71   Temp 98.4 F (36.9 C) (Oral)   Resp (!) 21   Ht '5\' 4"'  (1.626 m)   Wt 86.2 kg   SpO2 100%   BMI 32.61 kg/m   Physical Exam Vitals and nursing note reviewed.  Constitutional:      Appearance: Normal appearance.  HENT:     Head: Normocephalic and atraumatic.     Nose: Nose normal.     Mouth/Throat:     Mouth: Mucous membranes are moist.  Eyes:     Extraocular Movements: Extraocular movements intact.     Conjunctiva/sclera: Conjunctivae normal.  Cardiovascular:     Rate and Rhythm: Normal rate.  Pulmonary:     Effort: Pulmonary effort is normal.     Breath sounds: Normal breath sounds.  Abdominal:     General: Abdomen is flat.     Palpations: Abdomen is soft.     Tenderness: There is no abdominal tenderness.  Genitourinary:    Comments: Rectal: chaperone present, normal external exam without hemorrhoid or fissure, on DRE no mass or tenderness, normal brown stool Musculoskeletal:        General: No swelling. Normal range of motion.     Cervical back: Neck supple.  Skin:    General: Skin is warm and dry.  Neurological:     General: No focal deficit present.     Mental Status: She is alert.  Psychiatric:        Mood and Affect: Mood normal.      ED Results /  Procedures / Treatments   Labs (all labs ordered are listed, but only abnormal results are displayed) Labs Reviewed  COMPREHENSIVE METABOLIC PANEL - Abnormal; Notable for the following components:      Result Value   CO2 21 (*)    Glucose, Bld 116 (*)    BUN 6 (*)    Albumin 3.4 (*)    All other components within normal limits  CBC - Abnormal; Notable for the following components:   MCV 75.5 (*)    MCH 24.9 (*)    RDW 17.9 (*)    All other components within normal limits  POC OCCULT BLOOD, ED  TYPE AND SCREEN    EKG None  Radiology DG Chest 2 View  Result Date: 01/29/2021 CLINICAL DATA:  Shortness of breath EXAM: CHEST - 2 VIEW COMPARISON:  Yesterday FINDINGS: Normal heart size and mediastinal contours. There is no edema, consolidation, effusion, or pneumothorax. Porta catheter with tip in good position. No acute osseous finding. Left lumpectomy clips. IMPRESSION: No evidence of active disease. Electronically Signed   By: Monte Fantasia M.D.   On: 01/29/2021 09:11    Procedures Procedures  Medications Ordered in the ED Medications - No data to display   MDM Rules/Calculators/A&P MDM Patient's labs reviewed, no change from recent ED visits. She has heme negative stool however given reports of BRBPR and NSR on monitor today, advised she should hold her Eliquis pending evaluation in Afib clinic later this week. RTED for any worsening bleeding or any other concerns.  ED Course  I have reviewed the triage vital signs and the nursing notes.  Pertinent labs & imaging results that were available during my care of the patient were reviewed by me and considered in my medical decision making (see chart for details).     Final Clinical Impression(s) / ED Diagnoses  Final diagnoses:  Rectal bleeding    Rx / DC Orders ED Discharge Orders    None       Truddie Hidden, MD 01/30/21 1037

## 2021-01-30 NOTE — Discharge Instructions (Signed)
Please hold your Eliquis (blood thinner) until you are able to follow up in the Afib clinic. Return to the ER if your bleeding worsens in the meantime.

## 2021-02-02 ENCOUNTER — Encounter: Payer: Self-pay | Admitting: Hematology and Oncology

## 2021-02-02 ENCOUNTER — Ambulatory Visit (HOSPITAL_COMMUNITY)
Admission: RE | Admit: 2021-02-02 | Discharge: 2021-02-02 | Disposition: A | Discharge status: Home / Self Care | Payer: Medicare Other | Source: Ambulatory Visit | Attending: Hematology and Oncology | Admitting: Hematology and Oncology

## 2021-02-02 ENCOUNTER — Encounter (HOSPITAL_COMMUNITY): Payer: Self-pay

## 2021-02-02 ENCOUNTER — Inpatient Hospital Stay: Payer: Medicare Other | Attending: Gynecologic Oncology | Admitting: Hematology and Oncology

## 2021-02-02 ENCOUNTER — Other Ambulatory Visit: Payer: Self-pay

## 2021-02-02 DIAGNOSIS — I10 Essential (primary) hypertension: Secondary | ICD-10-CM

## 2021-02-02 DIAGNOSIS — I4891 Unspecified atrial fibrillation: Secondary | ICD-10-CM | POA: Diagnosis not present

## 2021-02-02 DIAGNOSIS — Z79899 Other long term (current) drug therapy: Secondary | ICD-10-CM | POA: Diagnosis not present

## 2021-02-02 DIAGNOSIS — C541 Malignant neoplasm of endometrium: Secondary | ICD-10-CM | POA: Diagnosis not present

## 2021-02-02 DIAGNOSIS — Z7982 Long term (current) use of aspirin: Secondary | ICD-10-CM | POA: Diagnosis not present

## 2021-02-02 DIAGNOSIS — Z9221 Personal history of antineoplastic chemotherapy: Secondary | ICD-10-CM | POA: Diagnosis not present

## 2021-02-02 DIAGNOSIS — R591 Generalized enlarged lymph nodes: Secondary | ICD-10-CM | POA: Insufficient documentation

## 2021-02-02 DIAGNOSIS — R590 Localized enlarged lymph nodes: Secondary | ICD-10-CM | POA: Diagnosis not present

## 2021-02-02 DIAGNOSIS — K5730 Diverticulosis of large intestine without perforation or abscess without bleeding: Secondary | ICD-10-CM | POA: Diagnosis not present

## 2021-02-02 DIAGNOSIS — C787 Secondary malignant neoplasm of liver and intrahepatic bile duct: Secondary | ICD-10-CM | POA: Diagnosis not present

## 2021-02-02 DIAGNOSIS — K7689 Other specified diseases of liver: Secondary | ICD-10-CM | POA: Diagnosis not present

## 2021-02-02 DIAGNOSIS — I700 Atherosclerosis of aorta: Secondary | ICD-10-CM | POA: Diagnosis not present

## 2021-02-02 MED ORDER — IOHEXOL 300 MG/ML  SOLN
100.0000 mL | Freq: Once | INTRAMUSCULAR | Status: AC | PRN
  Administered 2021-02-02: 100 mL via INTRAVENOUS

## 2021-02-02 NOTE — Progress Notes (Signed)
[place] OFFICE PROGRESS NOTE  Patient Care Team: Isaac Bliss, Rayford Halsted, MD as PCP - General (Internal Medicine) Axel Filler, MD  ASSESSMENT & PLAN:  Endometrial cancer [facility]) I have reviewed multiple imaging studies with the patient She has excellent response to therapy However, there is residual lymphadenopathy in the right pelvic region She is not symptomatic Due to her poor tolerance to treatment, we have made informed decision to discontinue treatment and go on active surveillance I will see her back in 6 weeks for further follow-up and plan to repeat imaging study again in 3 months  Essential hypertension She has poorly controlled hypertension while on treatment She was placed on atenolol due to recent tachycardia She has appointment to see cardiologist and will defer to cardiologist for further management   No orders of the defined types were placed in this encounter.   All questions were answered. The patient knows to call the clinic with any problems, questions or concerns. The total time spent in the appointment was 20 minutes encounter with patients including review of chart and various tests results, discussions about plan of care and coordination of care plan   [name], MD 02/02/2021 2:33 PM  INTERVAL HISTORY: Please see below for problem oriented charting. She returns for further follow-up She had 3 ER visits recently despite stopping treatment She had tachycardia and atrial fibrillation She was placed on anticoagulation therapy but then developed rectal bleeding and that was stopped She felt better now She denies abdominal pain  SUMMARY OF ONCOLOGIC HISTORY: Oncology History Overview Note  Poorly differentiated carcinoma, mixed histology with squamous differentiation, rare focus of clear cells as well as serous features MSI stable Her2 negative   Endometrial cancer ([place])  09/01/2019 Initial Diagnosis   The patient reported  a history of postmenopausal bleeding that began 1 to 2 months before diagnosis   09/14/2019 Imaging   US pelvis 1. Enlarged uterus with numerous myometrial masses, presumably fibroids. 2. Endometrial thickness of 6.2 mm. In the setting of post-menopausal bleeding, endometrial sampling is indicated to exclude carcinoma. If results are benign, sonohysterogram should be considered for focal lesion work-up.  3. Nonvisualized ovaries   09/23/2019 Pathology Results   A. ENDOMETRIUM, BIOPSY:  - Poorly differentiated carcinoma   10/15/2019 Imaging   Ct scan of chest, abdomen and pelvis: No evidence of metastatic disease within the chest, abdomen, or pelvis.   Enlarged fibroid uterus.   Colonic diverticulosis. No radiographic evidence of diverticulitis.   Aortic and coronary artery atherosclerosis.     10/22/2019 Pathology Results   SURGICAL PATHOLOGY   FINAL MICROSCOPIC DIAGNOSIS:   A. UTERUS, BILATERAL TUBES AND OVARIES, HYSTERECTOMY:  Poorly differentiated carcinoma, 6.5 cm.  Lymphovascular involvement by tumor.  Carcinoma involves inner half of the myometrium.  Margins not involved.  Cervix, bilateral fallopian tubes and bilateral ovaries free of tumor.   B. LYMPH NODE, RIGHT EXTERNAL SENTINEL, BIOPSY:  One lymph node with no metastatic carcinoma (0/1).   C. LYMPH NODE, RIGHT PELVIC, BIOPSY  Five lymph nodes with no metastatic carcinoma (0/5).   D. LYMPH NODE, LEFT PELVIC, BIOPSY:  Five lymph nodes with no metastatic carcinoma (0/5).   E. LYMPH NODE, RIGHT PERI AORTIC, BIOPSY:  One lymph node with no metastatic carcinoma (0/1).   F. LYMPH NODE, LEFT PERI AORTIC, BIOPSY:  Five lymph nodes with no metastatic carcinoma (0/5).    ONCOLOGY TABLE:  UTERUS, CARCINOMA OR CARCINOSARCOMA   Procedure: Total hysterectomy with bilateral f-oophorectomy and  sentinel  lymph nodes.  Histologic type: Poorly differentiated carcinoma, see comment.  Histologic Grade: High-grade, FIGO 3.   Myometrial invasion:       Depth of invasion: 13 mm       Myometrial thickness: 40 mm  Uterine Serosa Involvement: Not identified  Cervical stromal involvement: Not identified  Extent of involvement of other organs: Not identified  Lymphovascular invasion: Present  Regional Lymph Nodes:       Examined:     17 Sentinel                               0 non-sentinel                               17 total        Lymph nodes with metastasis: 0        Isolated tumor cells (<0.2 mm): 0        Micrometastasis:  (>0.2 mm and < 2.0 mm): 0        Macrometastasis: (>2.0 mm): 0  Representative Tumor Block: A5, A6, A7 and A8.  MMR / MSI testing: Pending  Pathologic Stage Classification (pTNM, AJCC 8th edition):  pT1a, pN0  Comments: The carcinoma is a high-grade poorly differentiated carcinoma which morphologically has predominantly serous features.  There are a few foci with squamous differentiation and a rare focus of clear cell features.  Immunohistochemistry for cytokeratin AE1/AE3 is performed on the sentinel lymph nodes and no positivity is identified.    10/22/2019 Surgery   Pre-operative Diagnosis: endometrial cancer grade 3   Post-operative Diagnosis: same,    Operation: Robotic-assisted laparoscopic total hysterectomy for uterus >250gm with bilateral salpingoophorectomy, SLN mapping, bilateral pelvic and para-aortic lymphadenectomy.   Surgeon: Donaciano Eva    Operative Findings:  : 16cm bulky fibroid uterus, normal ovaries bilaterally, no suspicious lymph nodes.     11/10/2019 Cancer Staging   Staging form: Corpus Uteri - Carcinoma and Carcinosarcoma, AJCC 8th Edition - Pathologic: Stage IVB (pT1a, pN0, cM1) - Signed by [name], MD on 05/16/2020   05/10/2020 Imaging   1. New hypodense 2.0 cm segment 4A left liver lobe mass, suspicious for hepatic metastasis. 2. New left pelvic sidewall 1.4 cm soft tissue nodule, suspicious for left internal iliac nodal metastasis. 3. New  left vaginal cuff 1.6 x 1.3 cm soft tissue nodule, suspicious for recurrent tumor. 4. Aortic Atherosclerosis (ICD10-I70.0).   05/18/2020 Procedure   Successful placement of a right internal jugular approach power injectable Port-A-Cath. The catheter is ready for immediate use.   05/23/2020 -  Chemotherapy   The patient had carboplatin and taxol for chemotherapy treatment.     07/28/2020 Imaging   1. No findings identified to suggest recurrent or metastatic disease. 2. Indeterminate and slightly exophytic lesion arising from the inferior pole of left kidney measures 0.8 cm. Technically this is too small to reliably characterize. Attention on follow-up imaging is advised. 3. Aortic atherosclerosis and LAD coronary artery atherosclerotic calcifications.   11/07/2020 Imaging   1. Interval progression of right pelvic sidewall lymphadenopathy, highly concerning for disease progression. PET-CT may prove helpful to further evaluate as clinically warranted. 2. Interval resolution of the previously identified hypodense lesion in segment IVA of the liver. 3. Stable 7 mm subcapsular lesion in the lower pole left kidney with attenuation higher than would be expected for a simple cyst  but is too small to reliably characterize. Attention on follow-up recommended. 4. Aortic Atherosclerosis (ICD10-I70.0).   11/16/2020 - 01/04/2021 Chemotherapy         02/02/2021 Imaging   1. Significant interval decrease in size in right pelvic sidewall, iliac, and inguinal lymph nodes, consistent with treatment response of nodal metastatic disease. 2. Multiple low-attenuation lesions throughout the liver, unchanged compared to prior examination, the majority of these consistent with simple cysts. A previously noted metastatic lesion of anterior hepatic segment VII remains resolved. No new lesions. Findings are consistent with sustained treatment response of hepatic metastatic disease. 3. Status post hysterectomy. Unchanged post  treatment appearance of left vaginal cuff and pelvic sidewall soft tissue, consistent with sustained treatment response. 4. No evidence of metastatic disease in the chest.   Aortic Atherosclerosis (ICD10-I70.0).       REVIEW OF SYSTEMS:   Constitutional: Denies fevers, chills or abnormal weight loss Eyes: Denies blurriness of vision Ears, nose, mouth, throat, and face: Denies mucositis or sore throat Respiratory: Denies cough, dyspnea or wheezes Cardiovascular: Denies palpitation, chest discomfort or lower extremity swelling Gastrointestinal:  Denies nausea, heartburn or change in bowel habits Skin: Denies abnormal skin rashes Lymphatics: Denies new lymphadenopathy or easy bruising Neurological:Denies numbness, tingling or new weaknesses Behavioral/Psych: Mood is stable, no new changes  All other systems were reviewed with the patient and are negative.  I have reviewed the past medical history, past surgical history, social history and family history with the patient and they are unchanged from previous note.  ALLERGIES:  is allergic to benicar hct [olmesartan medoxomil-hctz], iodinated diagnostic agents, bactrim [sulfamethoxazole-trimethoprim], pravastatin, shellfish allergy, and amlodipine.  MEDICATIONS:  Current Outpatient Medications  Medication Sig Dispense Refill  . acetaminophen (TYLENOL) 500 MG tablet Take 1,000 mg by mouth every 6 (six) hours as needed for moderate pain.    Marland Kitchen ALPRAZolam (XANAX) 0.25 MG tablet Take 1 tablet (0.25 mg total) by mouth 2 (two) times daily as needed for anxiety. 30 tablet 0  . APPLE CIDER VINEGAR PO Take 5 mLs by mouth daily.     Marland Kitchen aspirin EC 81 MG tablet Take 81 mg by mouth daily. Swallow whole.    Marland Kitchen atenolol (TENORMIN) 25 MG tablet Take 1 tablet (25 mg total) by mouth daily. 30 tablet 0  . b complex vitamins tablet Take 1 tablet by mouth daily.    . Blood Pressure Monitoring (BLOOD PRESSURE KIT) DEVI 1 Units by Does not apply route every  morning. 1 each 0  . calcium-vitamin D (OSCAL WITH D) 500-200 MG-UNIT tablet Take 1 tablet by mouth daily with breakfast.     . chlorthalidone (HYGROTON) 25 MG tablet Take 0.5 tablets (12.5 mg total) by mouth daily. 90 tablet 1  . famotidine (PEPCID) 20 MG tablet Take 20 mg by mouth 2 (two) times daily as needed for heartburn or indigestion.    Marland Kitchen lisinopril (ZESTRIL) 5 MG tablet Take 1 tablet (5 mg total) by mouth daily. 30 tablet 11  . loratadine (CLARITIN) 10 MG tablet Take 10 mg by mouth daily as needed for allergies.    . Multiple Vitamin (MULTIVITAMIN) capsule Take 1 capsule by mouth daily.    . ondansetron (ZOFRAN) 8 MG tablet Take 1 tablet (8 mg total) by mouth every 8 (eight) hours as needed. Start on the third day after chemotherapy. 30 tablet 1  . oxyCODONE (OXY IR/ROXICODONE) 5 MG immediate release tablet Take 1 tablet (5 mg total) by mouth every 4 (four) hours as needed  for severe pain. 30 tablet 0  . polyethylene glycol (MIRALAX / GLYCOLAX) 17 g packet Take 17 g by mouth daily as needed for mild constipation.    . prochlorperazine (COMPAZINE) 10 MG tablet Take 1 tablet (10 mg total) by mouth every 6 (six) hours as needed (Nausea or vomiting). 30 tablet 1   No current facility-administered medications for this visit.    PHYSICAL EXAMINATION: ECOG PERFORMANCE STATUS: 1 - Symptomatic but completely ambulatory  Vitals:   02/02/21 1416  BP: (!) 142/76  Pulse: 90  Resp: 20  Temp: 99.3 F (37.4 C)  SpO2: 100%   Filed Weights   02/02/21 1416  Weight: 187 lb 9.6 oz (85.1 kg)    GENERAL:alert, no distress and comfortable SKIN: skin color, texture, turgor are normal, no rashes or significant lesions EYES: normal, Conjunctiva are pink and non-injected, sclera clear OROPHARYNX:no exudate, no erythema and lips, buccal mucosa, and tongue normal  NECK: supple, thyroid normal size, non-tender, without nodularity LYMPH:  no palpable lymphadenopathy in the cervical, axillary or  inguinal LUNGS: clear to auscultation and percussion with normal breathing effort HEART: regular rate & rhythm and no murmurs and no lower extremity edema ABDOMEN:abdomen soft, non-tender and normal bowel sounds Musculoskeletal:no cyanosis of digits and no clubbing  NEURO: alert & oriented x 3 with fluent speech, no focal motor/sensory deficits  LABORATORY DATA:  I have reviewed the data as listed    Component Value Date/Time   NA 137 01/30/2021 0914   NA 140 07/04/2017 0952   K 3.7 01/30/2021 0914   CL 103 01/30/2021 0914   CO2 21 (L) 01/30/2021 0914   GLUCOSE 116 (H) 01/30/2021 0914   BUN 6 (L) 01/30/2021 0914   BUN 10 07/04/2017 0952   CREATININE 0.91 01/30/2021 0914   CREATININE 0.80 01/25/2021 0849   CREATININE 0.81 03/23/2015 0946   CALCIUM 9.3 01/30/2021 0914   PROT 6.5 01/30/2021 0914   ALBUMIN 3.4 (L) 01/30/2021 0914   AST 20 01/30/2021 0914   AST 22 01/25/2021 0849   ALT 14 01/30/2021 0914   ALT 21 01/25/2021 0849   ALKPHOS 71 01/30/2021 0914   BILITOT 0.6 01/30/2021 0914   BILITOT 0.5 01/25/2021 0849   GFRNONAA >60 01/30/2021 0914   GFRNONAA >60 01/25/2021 0849   GFRNONAA 78 03/23/2015 0946   GFRAA >60 09/12/2020 0853   GFRAA >89 03/23/2015 0946    No results found for: SPEP, UPEP  Lab Results  Component Value Date   WBC 5.0 01/30/2021   NEUTROABS 3.3 01/25/2021   HGB 12.0 01/30/2021   HCT 36.3 01/30/2021   MCV 75.5 (L) 01/30/2021   PLT 238 01/30/2021      Chemistry      Component Value Date/Time   NA 137 01/30/2021 0914   NA 140 07/04/2017 0952   K 3.7 01/30/2021 0914   CL 103 01/30/2021 0914   CO2 21 (L) 01/30/2021 0914   BUN 6 (L) 01/30/2021 0914   BUN 10 07/04/2017 0952   CREATININE 0.91 01/30/2021 0914   CREATININE 0.80 01/25/2021 0849   CREATININE 0.81 03/23/2015 0946      Component Value Date/Time   CALCIUM 9.3 01/30/2021 0914   ALKPHOS 71 01/30/2021 0914   AST 20 01/30/2021 0914   AST 22 01/25/2021 0849   ALT 14 01/30/2021 0914    ALT 21 01/25/2021 0849   BILITOT 0.6 01/30/2021 0914   BILITOT 0.5 01/25/2021 0849       RADIOGRAPHIC STUDIES: I have reviewed  CT imaging with the patient I have personally reviewed the radiological images as listed and agreed with the findings in the report. DG Chest 2 View  Result Date: 01/29/2021 CLINICAL DATA:  Shortness of breath EXAM: CHEST - 2 VIEW COMPARISON:  Yesterday FINDINGS: Normal heart size and mediastinal contours. There is no edema, consolidation, effusion, or pneumothorax. Porta catheter with tip in good position. No acute osseous finding. Left lumpectomy clips. IMPRESSION: No evidence of active disease. Electronically Signed   By: Monte Fantasia M.D.   On: 01/29/2021 09:11   CT CHEST ABDOMEN PELVIS W CONTRAST  Result Date: 02/02/2021 CLINICAL DATA:  Endometrial cancer restaging EXAM: CT CHEST, ABDOMEN, AND PELVIS WITH CONTRAST TECHNIQUE: Multidetector CT imaging of the chest, abdomen and pelvis was performed following the standard protocol during bolus administration of intravenous contrast. CONTRAST:  176m OMNIPAQUE IOHEXOL 300 MG/ML SOLN, additional oral enteric contrast COMPARISON:  CT abdomen pelvis, 11/07/2020, CT chest abdomen pelvis, 07/28/2020, CT abdomen pelvis, 05/10/2020 FINDINGS: CT CHEST FINDINGS Cardiovascular: Right chest port catheter. Aortic atherosclerosis normal heart size. No pericardial effusion. Mediastinum/Nodes: No enlarged mediastinal, hilar, or axillary lymph nodes. Thyroid gland, trachea, and esophagus demonstrate no significant findings. Lungs/Pleura: Lungs are clear. No pleural effusion or pneumothorax. Musculoskeletal: No chest wall mass or suspicious bone lesions identified. Postoperative findings of left lumpectomy. CT ABDOMEN PELVIS FINDINGS Hepatobiliary: Multiple low-attenuation lesions throughout the liver, unchanged compared to prior examination. A previously noted lesion of anterior hepatic segment VII remains resolved (series 2, image 49).  No new lesions. No gallstones, gallbladder wall thickening, or biliary dilatation. Pancreas: Unremarkable. No pancreatic ductal dilatation or surrounding inflammatory changes. Spleen: Normal in size without significant abnormality. Adrenals/Urinary Tract: Adrenal glands are unremarkable. Kidneys are normal, without renal calculi, solid lesion, or hydronephrosis. Bladder is unremarkable. Stomach/Bowel: Stomach is within normal limits. Appendix appears normal. No evidence of bowel wall thickening, distention, or inflammatory changes. Sigmoid diverticulosis. Vascular/Lymphatic: Aortic atherosclerosis. Significant interval decrease in size in right pelvic sidewall, iliac, and inguinal lymph nodes, largest right pelvic sidewall node measuring 1.9 x 1.3 cm, previously 3.2 x 2.3 cm (series 2, image 99). Reproductive: Status post hysterectomy. Unchanged post treatment appearance of left vaginal cuff and pelvic sidewall soft tissue (series 2, image 103, 97). Other: No abdominal wall hernia or abnormality. No abdominopelvic ascites. Musculoskeletal: No acute or significant osseous findings. IMPRESSION: 1. Significant interval decrease in size in right pelvic sidewall, iliac, and inguinal lymph nodes, consistent with treatment response of nodal metastatic disease. 2. Multiple low-attenuation lesions throughout the liver, unchanged compared to prior examination, the majority of these consistent with simple cysts. A previously noted metastatic lesion of anterior hepatic segment VII remains resolved. No new lesions. Findings are consistent with sustained treatment response of hepatic metastatic disease. 3. Status post hysterectomy. Unchanged post treatment appearance of left vaginal cuff and pelvic sidewall soft tissue, consistent with sustained treatment response. 4. No evidence of metastatic disease in the chest. Aortic Atherosclerosis (ICD10-I70.0). Electronically Signed   By: AEddie CandleM.D.   On: 02/02/2021 10:17   DG  Chest Port 1 View  Result Date: 01/28/2021 CLINICAL DATA:  Shortness of breath EXAM: PORTABLE CHEST 1 VIEW COMPARISON:  Chest x-ray 12/19/2020, CT chest 08/25/2020 FINDINGS: Right chest wall Port-A-Cath with tip overlying the right atrium. The heart size and mediastinal contours are unchanged. Aortic arch calcifications. No focal consolidation. No pulmonary edema. No pleural effusion. No pneumothorax. No acute osseous abnormality. IMPRESSION: No active disease. Electronically Signed   By: MIven Finn  M.D.   On: 01/28/2021 04:58

## 2021-02-02 NOTE — Progress Notes (Signed)
IR.  Patient with history of iodine contrast allergy who was pre-medicated with 13 hour prep prior to CT a/p today. Received call from CT tech stating that patient developed a hive on left arm.  Patient evaluated in CT. Left arm with approximate 1 cm hive on forearm. In addition, left upper arm (bicep area) with erythema, no hive. Patient states both sites itchy, but they have improved over the past 5 minutes. States she is short of breath secondary to GERD, no change from baseline. Denies itchy throat. States Benadryl was taken at 0730.  Patient stable for discharge. Advised patient to take additional Benadryl 25 mg if no improvement in hive over next hour. Advised patient to head to ED if becomes short of breath or if hives worse. All questions answered and concerns addressed. Patient conveys understanding and agrees with plan.  Please call IR with questions/concerns.   Bea Graff Sairah Knobloch, PA-C 02/02/2021, 9:07 AM

## 2021-02-02 NOTE — Assessment & Plan Note (Signed)
I have reviewed multiple imaging studies with the patient She has excellent response to therapy However, there is residual lymphadenopathy in the right pelvic region She is not symptomatic Due to her poor tolerance to treatment, we have made informed decision to discontinue treatment and go on active surveillance I will see her back in 6 weeks for further follow-up and plan to repeat imaging study again in 3 months

## 2021-02-02 NOTE — Assessment & Plan Note (Signed)
She has poorly controlled hypertension while on treatment She was placed on atenolol due to recent tachycardia She has appointment to see cardiologist and will defer to cardiologist for further management

## 2021-02-03 ENCOUNTER — Other Ambulatory Visit (HOSPITAL_COMMUNITY): Payer: Self-pay | Admitting: Physician Assistant

## 2021-02-03 ENCOUNTER — Ambulatory Visit (HOSPITAL_COMMUNITY)
Admission: RE | Admit: 2021-02-03 | Discharge: 2021-02-03 | Disposition: A | Discharge status: Home / Self Care | Payer: Medicare Other | Source: Ambulatory Visit | Attending: Physician Assistant | Admitting: Physician Assistant

## 2021-02-03 VITALS — BP 136/84 | HR 70 | Ht 64.0 in | Wt 187.4 lb

## 2021-02-03 DIAGNOSIS — Z6832 Body mass index (BMI) 32.0-32.9, adult: Secondary | ICD-10-CM | POA: Diagnosis not present

## 2021-02-03 DIAGNOSIS — Z888 Allergy status to other drugs, medicaments and biological substances status: Secondary | ICD-10-CM | POA: Insufficient documentation

## 2021-02-03 DIAGNOSIS — Z7901 Long term (current) use of anticoagulants: Secondary | ICD-10-CM | POA: Insufficient documentation

## 2021-02-03 DIAGNOSIS — Z8249 Family history of ischemic heart disease and other diseases of the circulatory system: Secondary | ICD-10-CM | POA: Insufficient documentation

## 2021-02-03 DIAGNOSIS — Z8542 Personal history of malignant neoplasm of other parts of uterus: Secondary | ICD-10-CM | POA: Insufficient documentation

## 2021-02-03 DIAGNOSIS — Z79899 Other long term (current) drug therapy: Secondary | ICD-10-CM | POA: Diagnosis not present

## 2021-02-03 DIAGNOSIS — E669 Obesity, unspecified: Secondary | ICD-10-CM | POA: Diagnosis not present

## 2021-02-03 DIAGNOSIS — I480 Paroxysmal atrial fibrillation: Secondary | ICD-10-CM | POA: Insufficient documentation

## 2021-02-03 DIAGNOSIS — I10 Essential (primary) hypertension: Secondary | ICD-10-CM | POA: Diagnosis not present

## 2021-02-03 DIAGNOSIS — D6869 Other thrombophilia: Secondary | ICD-10-CM | POA: Diagnosis not present

## 2021-02-03 MED ORDER — APIXABAN 5 MG PO TABS: 5.0000 mg | ORAL_TABLET | Freq: Two times a day (BID) | ORAL | 3 refills | Status: DC

## 2021-02-03 NOTE — Patient Instructions (Signed)
Stop aspirin  Restart Eliquis 5mg  twice a day

## 2021-02-03 NOTE — Progress Notes (Signed)
Primary Care Physician: Isaac Bliss, Rayford Halsted, MD Primary Cardiologist: none Primary Electrophysiologist: none Referring Physician: [facility]   Tina Stone is a 71 y.o. female with a history of endometrial cancer followed by Dr Alvy Bimler, HTN, and atrial fibrillation who presents for consultation in the [facility]. The patient was initially diagnosed with atrial fibrillation 01/28/21 after presenting with symptoms of palpitations. EMS reports the patient was initially in SVT with a fast heart rate close to 200.  Patient was given adenosine multiple times and was noted to be in atrial fibrillation.  Patient was started on Cardizem and she converted to sinus tachycardia. Patient is on Eliquis for a CHADS2VASC score of 3. She returned to the ED 01/30/21 with blood on tissue after BM. Negative hemoccult in ED. Her Eliquis was held. She has not had any further heart racing. She denies any snoring or alcohol use.   Today, she denies symptoms of palpitations, chest pain, shortness of breath, orthopnea, PND, lower extremity edema, dizziness, presyncope, syncope, snoring, daytime somnolence, bleeding, or neurologic sequela. The patient is tolerating medications without difficulties and is otherwise without complaint today.    Atrial Fibrillation Risk Factors:  she does not have symptoms or diagnosis of sleep apnea. she does not have a history of rheumatic fever. she does not have a history of alcohol use. The patient does not have a history of early familial atrial fibrillation or other arrhythmias.  she has a BMI of Body mass index is 32.17 kg/m.Marland Kitchen Filed Weights   02/03/21 1015  Weight: 85 kg    Family History  Problem Relation Age of Onset  . Diabetes Mother   . Hypertension Mother   . Colon polyps Mother 71  . Dementia Mother 85  . Diabetes Father   . Congestive Heart Failure Father   . Pancreatic cancer Paternal Aunt   . Colon cancer Neg Hx    . Breast cancer Neg Hx   . Lung cancer Neg Hx   . Esophageal cancer Neg Hx   . Rectal cancer Neg Hx   . Stomach cancer Neg Hx      Atrial Fibrillation Management history:  Previous antiarrhythmic drugs: none Previous cardioversions: none Previous ablations: none CHADS2VASC score: 3 Anticoagulation history: Eliquis   Past Medical History:  Diagnosis Date  . Adenomatous colon polyp   . Allergic rhinitis, seasonal   . Allergy   . Beta thalassemia trait 11/25/2013  . Cholelithiasis   . Class 3 obesity without serious comorbidity with body mass index (BMI) of 40.0 to 44.9 in adult 11/19/2012  . endometrial ca dx'd 08/2009   endometrial   . GERD (gastroesophageal reflux disease)   . HLD (hyperlipidemia)   . Hypercholesterolemia   . Hypertension 03/18/2017   no meds   . Intraductal papilloma of left breast    Patient underwent left needle-localized lumpectomy by Dr. Imogene Burn. Tsuei on 09/09/2013; pathology showed intraductal papilloma with no atypia or malignancy identified.  Marland Kitchen PONV (postoperative nausea and vomiting)   . Pre-diabetes    pt denies  . Uterine fibroid    Past Surgical History:  Procedure Laterality Date  . [place]   right  . BREAST EXCISIONAL BIOPSY Left 2014   benign  . BREAST LUMPECTOMY WITH NEEDLE LOCALIZATION Left 09/09/2013   Procedure: BREAST LUMPECTOMY WITH NEEDLE LOCALIZATION;  Surgeon: Imogene Burn. Georgette Dover, MD;  Location: [facility];  Service: General;  Laterality: Left;  . CHOLECYSTECTOMY    .  COLONOSCOPY    . IR IMAGING GUIDED PORT INSERTION  05/18/2020  . POLYPECTOMY    . ROBOTIC ASSISTED LAPAROSCOPIC CHOLECYSTECTOMY  09/09/2019  . ROBOTIC ASSISTED TOTAL HYSTERECTOMY WITH BILATERAL SALPINGO OOPHERECTOMY N/A 10/22/2019   Procedure: XI ROBOTIC ASSISTED TOTAL HYSTERECTOMY WITH BILATERAL SALPINGO OOPHORECTOMY GREATER THAN 250 GRAMS, MINI LAPARTOMY FOR SPECIMEN DELIVERY; PELVIC AND PERI-AORTIC LYMPHADENECTOMY;  Surgeon: Everitt Amber, MD;   Location: [facility];  Service: Gynecology;  Laterality: N/A;  . SENTINEL NODE BIOPSY N/A 10/22/2019   Procedure: SENTINEL NODE BIOPSY;  Surgeon: Everitt Amber, MD;  Location: [facility];  Service: Gynecology;  Laterality: N/A;    Current Outpatient Medications  Medication Sig Dispense Refill  . acetaminophen (TYLENOL) 500 MG tablet Take 1,000 mg by mouth every 6 (six) hours as needed for moderate pain.    Marland Kitchen ALPRAZolam (XANAX) 0.25 MG tablet Take 1 tablet (0.25 mg total) by mouth 2 (two) times daily as needed for anxiety. 30 tablet 0  . APPLE CIDER VINEGAR PO Take 5 mLs by mouth daily.     Marland Kitchen atenolol (TENORMIN) 25 MG tablet Take 1 tablet (25 mg total) by mouth daily. 30 tablet 0  . Blood Pressure Monitoring (BLOOD PRESSURE KIT) DEVI 1 Units by Does not apply route every morning. 1 each 0  . loratadine (CLARITIN) 10 MG tablet Take 10 mg by mouth daily as needed for allergies.    . Multiple Vitamin (MULTIVITAMIN) capsule Take 1 capsule by mouth daily.    . ondansetron (ZOFRAN) 8 MG tablet Take 1 tablet (8 mg total) by mouth every 8 (eight) hours as needed. Start on the third day after chemotherapy. 30 tablet 1  . oxyCODONE (OXY IR/ROXICODONE) 5 MG immediate release tablet Take 1 tablet (5 mg total) by mouth every 4 (four) hours as needed for severe pain. 30 tablet 0  . polyethylene glycol (MIRALAX / GLYCOLAX) 17 g packet Take 17 g by mouth daily as needed for mild constipation.    . prochlorperazine (COMPAZINE) 10 MG tablet Take 1 tablet (10 mg total) by mouth every 6 (six) hours as needed (Nausea or vomiting). 30 tablet 1  . apixaban (ELIQUIS) 5 MG TABS tablet Take 1 tablet (5 mg total) by mouth 2 (two) times daily. 60 tablet 3   No current facility-administered medications for this encounter.    Allergies  Allergen Reactions  . Benicar Hct [Olmesartan Medoxomil-Hctz] Shortness Of Breath and Palpitations  . Iodinated Diagnostic Agents Hives and Itching    02/02/2021-  pt developed 2 hives and itching  even with the 13hr premedication.  Radiology PA came into eval the pt.  Developed itching and hives after injection on 05/10/20; needs 13hr prep in future  . Bactrim [Sulfamethoxazole-Trimethoprim] Other (See Comments)    Abdominal pain, dizziness  . Pravastatin Other (See Comments)    Lower abdominal pain  . Shellfish Allergy Nausea And Vomiting  . Amlodipine Palpitations    Social History   Socioeconomic History  . Marital status: Widowed    Spouse name: Not on file  . Number of children: Not on file  . Years of education: Not on file  . Highest education level: Not on file  Occupational History  . Not on file  Tobacco Use  . Smoking status: Never Smoker  . Smokeless tobacco: Never Used  . Tobacco comment: few puffs but not a true smoker quit many yrs ago  Vaping Use  . Vaping Use: Never used  Substance and Sexual Activity  . Alcohol use:  Never  . Drug use: Never  . Sexual activity: Not Currently  Other Topics Concern  . 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  Physical Exam: Vitals:   02/03/21 1015  BP: 136/84  Pulse: 70  Weight: 85 kg  Height: _0  (1.626 m)    GEN- The patient is well appearing obese female, alert and oriented x 3 today.   Head- normocephalic, atraumatic Eyes-  Sclera clear, conjunctiva pink Ears- hearing intact Oropharynx- clear Neck- supple  Lungs- Clear to ausculation bilaterally, normal work of breathing Heart- Regular rate and rhythm, no murmurs, rubs or gallops  GI-  soft, NT, ND, + BS Extremities- no clubbing, cyanosis, or edema MS- no significant deformity or atrophy Skin- no rash or lesion Psych- euthymic mood, full affect Neuro- strength and sensation are intact  Wt Readings from Last 3 Encounters:  02/03/21 85 kg  02/02/21 85.1 kg  01/30/21 86.2 kg    EKG today demonstrates  SR Vent. rate 70 BPM PR interval 146 ms QRS duration 68 ms QT/QTc 370/399 ms  Epic records are reviewed at length today  CHA2DS2-VASc Score = 3  The patient's score is based upon: CHF History: No HTN History: Yes Diabetes History: No Stroke History: No Vascular Disease History: No Age Score: 1 Gender Score: 1      ASSESSMENT AND PLAN: 1. Paroxysmal Atrial Fibrillation (ICD10:  I48.0) The patient's CHA2DS2-VASc score is 3, indicating a 3.2% annual risk of stroke.   General education about afib provided and questions answered. We also discussed her stroke risk and the risks and benefits of anticoagulation. Given her CV score, will resume Eliquis 5 mg BID. If bleeding reoccurs, she will need GI evaluation.  Check echocardiogram Continue atenolol 25 mg daily  2. Secondary Hypercoagulable State (ICD10:  D68.69) The patient is at significant risk for stroke/thromboembolism based upon her CHA2DS2-VASc Score of 3.  Continue Apixaban (Eliquis).   3. Obesity Body mass index is 32.17 kg/m. Lifestyle modification was discussed at length including regular exercise and weight reduction.  4. HTN Stable, no changes today.   Follow up in the AF clinic in one month.    [facility] [address] [place], [place] [address] [phone] 02/03/2021 11:03 AM

## 2021-02-08 ENCOUNTER — Ambulatory Visit: Payer: Medicare Other | Attending: Internal Medicine | Admitting: Physical Therapy

## 2021-02-08 ENCOUNTER — Other Ambulatory Visit: Payer: Self-pay

## 2021-02-08 ENCOUNTER — Encounter: Payer: Self-pay | Admitting: Physical Therapy

## 2021-02-08 DIAGNOSIS — M25551 Pain in right hip: Secondary | ICD-10-CM | POA: Insufficient documentation

## 2021-02-08 DIAGNOSIS — R262 Difficulty in walking, not elsewhere classified: Secondary | ICD-10-CM

## 2021-02-08 DIAGNOSIS — M6281 Muscle weakness (generalized): Secondary | ICD-10-CM | POA: Diagnosis not present

## 2021-02-08 NOTE — Therapy (Signed)
[place], [place], [address] Phone: [phone]   Fax:  [phone]  Physical Therapy Treatment  Patient Details  Name: Tina Stone MRN: [id number] Date of Birth: October 12, 1950 Referring Provider (PT): Isaac Bliss, Rayford Halsted, MD   Encounter Date: 02/08/2021   PT End of Session - 02/08/21 1808    Visit Number 6    Number of Visits 12    Date for PT Re-Evaluation 03/22/21    Authorization Type UHC MCR    Authorization Time Period FOTO visit 6, PN visit 10    PT Start Time 1416    PT Stop Time 1500    PT Time Calculation (min) 44 min    Activity Tolerance Patient tolerated treatment well    Behavior During Therapy [facility] for tasks assessed/performed           Past Medical History:  Diagnosis Date  . Adenomatous colon polyp   . Allergic rhinitis, seasonal   . Allergy   . Beta thalassemia trait 11/25/2013  . Cholelithiasis   . Class 3 obesity without serious comorbidity with body mass index (BMI) of 40.0 to 44.9 in adult 11/19/2012  . endometrial ca dx'd 08/2009   endometrial   . GERD (gastroesophageal reflux disease)   . HLD (hyperlipidemia)   . Hypercholesterolemia   . Hypertension 03/18/2017   no meds   . Intraductal papilloma of left breast    Patient underwent left needle-localized lumpectomy by Dr. Imogene Burn. Tsuei on 09/09/2013; pathology showed intraductal papilloma with no atypia or malignancy identified.  Marland Kitchen PONV (postoperative nausea and vomiting)   . Pre-diabetes    pt denies  . Uterine fibroid     Past Surgical History:  Procedure Laterality Date  . [place]   right  . BREAST EXCISIONAL BIOPSY Left 2014   benign  . BREAST LUMPECTOMY WITH NEEDLE LOCALIZATION Left 09/09/2013   Procedure: BREAST LUMPECTOMY WITH NEEDLE LOCALIZATION;  Surgeon: Imogene Burn. Georgette Dover, MD;  Location: [facility];  Service: General;  Laterality: Left;  . CHOLECYSTECTOMY    . COLONOSCOPY     . IR IMAGING GUIDED PORT INSERTION  05/18/2020  . POLYPECTOMY    . ROBOTIC ASSISTED LAPAROSCOPIC CHOLECYSTECTOMY  09/09/2019  . ROBOTIC ASSISTED TOTAL HYSTERECTOMY WITH BILATERAL SALPINGO OOPHERECTOMY N/A 10/22/2019   Procedure: XI ROBOTIC ASSISTED TOTAL HYSTERECTOMY WITH BILATERAL SALPINGO OOPHORECTOMY GREATER THAN 250 GRAMS, MINI LAPARTOMY FOR SPECIMEN DELIVERY; PELVIC AND PERI-AORTIC LYMPHADENECTOMY;  Surgeon: Everitt Amber, MD;  [facility];  Service: Gynecology;  Laterality: N/A;  . SENTINEL NODE BIOPSY N/A 10/22/2019   Procedure: SENTINEL NODE BIOPSY;  Surgeon: Everitt Amber, MD;  Location: [facility];  Service: Gynecology;  Laterality: N/A;    There were no vitals filed for this visit.   Subjective Assessment - 02/08/21 1806    Subjective I am not having pain in my right hip now but sometimes in my left knee    Patient Stated Goals decrease pain, improve mobility, swimming & walking    Currently in Pain? Yes    Pain Score 0-No pain    Pain Location Hip    Pain Orientation Right    Multiple Pain Sites Yes    Pain Score 3    Pain Location Knee    Pain Orientation Left;Lateral    Pain Descriptors / Indicators Aching;Hervey Ard              [facility] PT Assessment - 02/08/21 0001  Assessment   Medical Diagnosis Rt hip pain    Referring Provider (PT) Isaac Bliss, Rayford Halsted, MD      Sensation   Additional Comments neuropathy creates a little numbness in feet      Strength   Overall Strength Comments gross 4/5      Palpation   Palpation comment decrease tenderness R greater troch than on eval.  3/10 pain on L lateral fibular head and peroneals      Ambulation/Gait   Gait Comments Pt still with antalgic gait but no AD with ambulation.                  Aquaic therapy at [address] - therapeutic pool temp 88  degrees Pt enters building without AD.  Treatment took place in water 3.0 to 5.feet deep depending upon activity.  Pt entered and exited  the pool via stair and handrails with supervision  Ms Gavel  was able to use aquatic barbells submerged for increased abdominal engagement  with side stepping  15 ft x 4, backwards 15 ft x 4 and forward stepping 15 ft x 4. Also jogging forward and backward 15 x 4 without use of barbells.  Runners stretch 2 x Right and then moves into hamstring stretch,  Runners stretch  2 x Left and then moves into hamstring stretch. TC to insure proper technique. Pt with some irritation of L lateral knee With knee flexion.  Using underwater step, pt with forward step ups on R and L 15 x each. Side step 15 x 1 R and L and curtsy lunge 15 x on R and the L with increased  And more comfortable dynamic stretch of IT band with benefit to knees.   Ms Gosch using cuff weights to LE for added resistance in water for exercises.  On edge of pool with bil UE support  Pt performed LE exercise  Hip abd/add R/L 10 x each and then using 1 UE support Hip ext/flex with knee straight x 20, pt needing VC and TC for correct execution and sequencing  Marching knee/hip 90/90 x 20   ham curl R/L x 20.  Hips at 90 degree flexion to 90 degrees abduction for IR/ER AROM of R and then L hip with chest deep water.  No pain with any movements in water Squats x 15 reps with intermittent UE support x 2 sets. Lunge to target x 10 on R and the x 10 on LE Jumping jacks x 10 and treading water for 30 sec x 2 Tall kneeling on step with cushion under knees with hip hinge x 10  In corner with deep water 5 feet , pt with bil UE support bicycle kicks for 1 minute, then treads water for 1 minutes x 2  Aquastretch for Left quadriceps laterally.  Left fibular head AP mob grade 3 with STW over peroneals and anterior tibialis      Pt requires the buoyancy of water for active assisted exercises with buoyancy supported for strengthening & ROM exercises: PT  requires the viscosity of the water for resistance with strengthening exercises Hydrostatic  pressure also supports joints by unweighting joint load by at least 50 % in 3-4 feet depth water. 80% in chest to neck deep water. Water will allow for reduced gait deviation due to reduced joint loading through buoyancy to help patient improve posture without excess stress and pain.  Water current provides perturbations which challenge standing balance unsupportedt  PT Education - 02/08/21 1808    Education Details reinforced principles of water and benefits for strength and improvement in balance and gait    Person(s) Educated Patient    Methods Explanation;Demonstration;Tactile cues;Verbal cues    Comprehension Verbalized understanding;Returned demonstration            PT Short Term Goals - 01/09/21 0913      PT SHORT TERM GOAL #1   Title Pt will demo heel-toe gait pattern    Baseline flat foot strike with antalgic pattern at eval; heel strike initial contact, limited toe off 01/09/21    Time 3    Period Weeks    Status On-going    Target Date 12/30/20      PT SHORT TERM GOAL #2   Title pt will verbalize ability to use HEP to improve daily mobility of hip joint    Baseline began establishing at eval; patient reports HEP is helping with pain/mobility isn't as consistent as she plans.    Time 3    Period Weeks    Status Achieved    Target Date 12/30/20             PT Long Term Goals - 02/08/21 2144      PT LONG TERM GOAL #1   Title pt will ambulate without antalgic gait pattern with use of LRAD    Baseline Pt now not using AD but has slight antalgic gait pattern    Time 6    Period Weeks    Status On-going    Target Date 03/22/21      PT LONG TERM GOAL #2   Title pain <=2/10 with ADLS    Baseline 2/42 in hip but 3/10 in Left knee 02-08-21    Time 6    Period Weeks    Status On-going    Target Date 03/22/21      PT LONG TERM GOAL #3   Title pt will be independent in long term aquatics strengthening program to continue after d/c     Baseline Pt has been once to the [employer] with sister and is researching community wellness opportunities    Time 6    Period Weeks    Status On-going    Target Date 03/22/21      PT LONG TERM GOAL #4   Title gross hip strength to 4+/5    Baseline 4/5 today    Time 6    Period Weeks    Status On-going    Target Date 03/22/21      PT LONG TERM GOAL #5   Title Pt will be able to demonstrate safe floor to chair transfer without exacerbating pain in hips or knees independently to reduce fear of falling    Baseline Pt is apprehensive about falling and retrieving items dropped from floor    Time 6    Period Weeks    Status New    Target Date 03/22/21                 Plan - 02/08/21 1809    Clinical Impression Statement Ms Check enters [facility] for second aquatic therapy session.  She has benefitted last week for more restful sleep and has even gone to a water program with her sister at the local [employer].  After RX session today , pt had no pain in R hip. but did complain of knee pain.  Pt has one more treatment session in aquatics and would benefit from an  additonal 4 weeks to progress /and use some of the flexibility gained in the pool to land based exercises.Pt requires the buoyancy of water for active assisted exercises with buoyancy supported for strengthening & ROM exercises:   Hydrostatic pressure also supports joints by unweighting joint load by at least 50 % in 3-4 feet depth water. 80% in chest to neck deep water. Pt would still benefit from skilled PT for gait and and strengthening to improve gait on land and to strengthen in order to be able to independently demonstrate floor to stand and vice versa xfers to reduce fear of falling and to be able to retrieve items as needed that drop on the floor without fear. Pt woudl benefit from addtional 1 x /week PT for 6 weeks or until all LTG achieved    Personal Factors and Comorbidities Comorbidity 2;Time since onset of injury/illness/exacerbation     Comorbidities active CA, OA    Examination-Activity Limitations Locomotion Level;Transfers;Bed Mobility;Sit;Caring for Others;Sleep;Squat;Stairs;Stand    Examination-Participation Restrictions Church;Meal Prep;Cleaning;Occupation;Driving    PT Frequency 1x / week    PT Duration 6 weeks    PT Treatment/Interventions ADLs/Self Care Home Management;Cryotherapy;Gait training;Stair training;Functional mobility training;Therapeutic activities;Therapeutic exercise;Neuromuscular re-education;Manual techniques;Patient/family education;Balance training;Passive range of motion;Taping;Aquatic Therapy    PT Next Visit Plan cont gait training, gross hip strength. Must have a land visit  FOTO at land visit and floor to chair x fer   PT Home Exercise Plan [id number]    Consulted and Agree with Plan of Care Patient           Patient will benefit from skilled therapeutic intervention in order to improve the following deficits and impairments:  Abnormal gait,Decreased range of motion,Difficulty walking,Increased muscle spasms,Decreased activity tolerance,Pain,Improper body mechanics,Decreased mobility,Decreased strength  Visit Diagnosis: Pain in right hip  Difficulty in walking, not elsewhere classified  Muscle weakness (generalized)     Problem List Patient Active Problem List   Diagnosis Date Noted  . Paroxysmal atrial fibrillation ([place]) 02/03/2021  . Secondary hypercoagulable state ([place]) 02/03/2021  . Fall 11/29/2020  . Mild anxiety 11/28/2020  . Essential hypertension 11/16/2020  . Cancer associated pain 11/08/2020  . Vitamin D deficiency 10/27/2020  . Pancytopenia, acquired ([place]) 09/12/2020  . Skin rash 08/17/2020  . Metastasis to liver ([place]) 06/14/2020  . Bone pain 05/27/2020  . Goals of care, counseling/discussion 05/16/2020  . Endometrial cancer ([place]) 10/22/2019  . Postmenopausal bleeding 09/02/2019  . Biliary colic 34/19/6222  . Breast pain, left 05/20/2019  . Trapezius muscle  spasm 05/11/2019  . Elevated random blood glucose level 02/13/2019  . Epigastric pain 01/29/2019  . Prediabetes 03/18/2017  . Hypertension 03/18/2017  . Beta thalassemia trait 11/25/2013  . Class II obesity 11/19/2012  . Women's annual routine gynecological examination 07/12/2011  . Gastroesophageal reflux disease 01/28/2007  . Hyperlipidemia 10/10/2006    Voncille Lo, PT, [place] Certified Exercise Expert for the Aging Adult  02/08/21 9:52 PM Phone: [phone] Fax: [phone]  [facility] [address] [place], [place], [address] Phone: [phone]   Fax:  [phone]  Name: Tina Stone MRN: [id number] Date of Birth: [birth year]

## 2021-02-15 ENCOUNTER — Ambulatory Visit: Payer: Medicare Other | Admitting: Physical Therapy

## 2021-02-27 ENCOUNTER — Other Ambulatory Visit (HOSPITAL_COMMUNITY): Payer: Self-pay | Admitting: *Deleted

## 2021-02-27 MED ORDER — APIXABAN 5 MG PO TABS: 5.0000 mg | ORAL_TABLET | Freq: Two times a day (BID) | ORAL | 3 refills | Status: DC

## 2021-02-27 MED ORDER — ATENOLOL 25 MG PO TABS: 25.0000 mg | ORAL_TABLET | Freq: Every day | ORAL | 3 refills | Status: DC

## 2021-03-01 ENCOUNTER — Encounter: Payer: Self-pay | Admitting: Physical Therapy

## 2021-03-01 ENCOUNTER — Ambulatory Visit: Payer: Medicare Other | Attending: Internal Medicine | Admitting: Physical Therapy

## 2021-03-01 ENCOUNTER — Other Ambulatory Visit: Payer: Self-pay

## 2021-03-01 DIAGNOSIS — M25551 Pain in right hip: Secondary | ICD-10-CM | POA: Insufficient documentation

## 2021-03-01 DIAGNOSIS — M6281 Muscle weakness (generalized): Secondary | ICD-10-CM | POA: Diagnosis not present

## 2021-03-01 DIAGNOSIS — R262 Difficulty in walking, not elsewhere classified: Secondary | ICD-10-CM | POA: Insufficient documentation

## 2021-03-01 NOTE — Patient Instructions (Signed)
Aquatics Home Program 4 Pool Written Home Exercise All exercises you will feel a stretch but should be PAIN FREE Be aware of neutral spine/Water immersion requires continuous muscle activation with static positioning. You need to be developing muscle endurance - ability to do work over a longer period of time.  1) sitting hip hinge  Sit in waist deep  water  Bend at hips with chest up( show shirt logo) and look up/ chin down Bend forward as far as possible hold 5 sec  x15  2) standing hip hinge  Stand about a foot from wall Cross arms in front of you like holding groceries and shutting car door with buttocks  Try to touch wall with buttocks  Repeat x15   3)  Runner's stretch. use steps in pool and hold onto rail as needed, place right foot on step and bend right knee as far as you can go. This will stretch your left hip flexors as well.  Hold about 10-15 sec and repeat 5 x on Right leg. Repeat with opposite leg 4) Follow with hamstring stretch on step as shown in pool x 5 and hold 15-30 sec  5) Use ball / floating weights/ noodle to press down in water to increase abdominal engagement while walking through the shallow water  Now, Begin with walking back and forth in water increasing speed to increase strength. Go to pool ledge and perform exercises to warm up 1) Marching in place x 20,  2) With knee straight kick leg forward and backward 20 x (Leg flex/ext) Remember to keep core quiet and engaged as shown in clinic. 3)  With knee straight kick leg across body(leading with heel) and away from body (to the side and back and return to across your body as shown in aquatic therapy x 20. Remember to keep core quiet and engaged. 4) Standing by pool ledge,(hamstrings curl) bend knee (as if you are kicking your buttock with your heel) x 20 5) Internal rotatation/External Rotation of Hips standing with Single limb stance on L and R lower extremity in figure 4 pose. Bring knee to midline and then  back 10 x each 6) Heel raises x 30 7) Squat x 20 holding onto pool ledge as deeply as possible 8) Lunge walk across pool 9) Leg circles clockwise and counter clockwise 10 x  10) Using kick board , submerge in water and rotate torso to engage abdominal muscles as shown in water/aquatics 11) If you have a submerged step, you can step up forward, laterally and do curtsy lunge as shown in clinic 10 x          Tina Stone, PT, [place] Certified Exercise Expert for the Aging Adult  12/14/20 4:01 PM Phone: [phone] Fax: [phone]

## 2021-03-01 NOTE — Therapy (Signed)
[place], [place], [address] Phone: [phone]   Fax:  [phone]  Physical Therapy Treatment  Patient Details  Name: Tina Stone MRN: [id number] Date of Birth: October 14, 1950 Referring Provider (PT): Isaac Bliss, Rayford Halsted, MD   Encounter Date: 03/01/2021   PT End of Session - 03/01/21 1652    Visit Number 7    Number of Visits 12    Date for PT Re-Evaluation 03/22/21    Authorization Type UHC MCR    Authorization Time Period FOTO visit 6, PN visit 10    PT Start Time 1501    PT Stop Time 1550    PT Time Calculation (min) 49 min    Activity Tolerance Patient tolerated treatment well    Behavior During Therapy [facility] for tasks assessed/performed           Past Medical History:  Diagnosis Date  . Adenomatous colon polyp   . Allergic rhinitis, seasonal   . Allergy   . Beta thalassemia trait 11/25/2013  . Cholelithiasis   . Class 3 obesity without serious comorbidity with body mass index (BMI) of 40.0 to 44.9 in adult 11/19/2012  . endometrial ca dx'd 08/2009   endometrial   . GERD (gastroesophageal reflux disease)   . HLD (hyperlipidemia)   . Hypercholesterolemia   . Hypertension 03/18/2017   no meds   . Intraductal papilloma of left breast    Patient underwent left needle-localized lumpectomy by Dr. Imogene Burn. Tsuei on 09/09/2013; pathology showed intraductal papilloma with no atypia or malignancy identified.  Marland Kitchen PONV (postoperative nausea and vomiting)   . Pre-diabetes    pt denies  . Uterine fibroid     Past Surgical History:  Procedure Laterality Date  . [place]   right  . BREAST EXCISIONAL BIOPSY Left 2014   benign  . BREAST LUMPECTOMY WITH NEEDLE LOCALIZATION Left 09/09/2013   Procedure: BREAST LUMPECTOMY WITH NEEDLE LOCALIZATION;  Surgeon: Imogene Burn. Georgette Dover, MD;  Location: [place];  Service: General;  Laterality: Left;  . CHOLECYSTECTOMY    . COLONOSCOPY     . IR IMAGING GUIDED PORT INSERTION  05/18/2020  . POLYPECTOMY    . ROBOTIC ASSISTED LAPAROSCOPIC CHOLECYSTECTOMY  09/09/2019  . ROBOTIC ASSISTED TOTAL HYSTERECTOMY WITH BILATERAL SALPINGO OOPHERECTOMY N/A 10/22/2019   Procedure: XI ROBOTIC ASSISTED TOTAL HYSTERECTOMY WITH BILATERAL SALPINGO OOPHORECTOMY GREATER THAN 250 GRAMS, MINI LAPARTOMY FOR SPECIMEN DELIVERY; PELVIC AND PERI-AORTIC LYMPHADENECTOMY;  Surgeon: Everitt Amber, MD;  [facility];  Service: Gynecology;  Laterality: N/A;  . SENTINEL NODE BIOPSY N/A 10/22/2019   Procedure: SENTINEL NODE BIOPSY;  Surgeon: Everitt Amber, MD;  Location[facility];  Service: Gynecology;  Laterality: N/A;    There were no vitals filed for this visit.   Subjective Assessment - 03/01/21 1648    Subjective I dont have any pain today  only when i touch my lateral left knee..I go to the [facility] now with my sister and I will continue after I leave PT    Patient Stated Goals decrease pain, improve mobility, swimming & walking    Currently in Pain? No/denies    Pain Score 0-No pain    Pain Location Hip    Pain Orientation Right    Pain Descriptors / Indicators Aching    Pain Score 3    Pain Location Knee   when PT palpates medial knee   Pain Orientation Left;Lateral  Aquaic therapy at [address] - therapeutic pool temp 88 degrees Pt enters building without AD. Treatment took place in water 3.6 to 5.feet deep depending upon activity. Pt entered and exited the pool via stair and handrails with supervision  Ms Hepworth  was able to use aquatic barbells submerged for increased abdominal engagement with side stepping 15 ft x 4, backwards 15 ft x 4 and forward stepping 15 ft x 4. Also jogging forward and backward 15 x 4 without use of barbells.  Pt was shown alternate technique to stretch hamstring and flexor 1 minute each on R and L using pool noodle and included in HEP  Using underwater step, pt with forward step ups on R  and L 15 x each. Side step 15 x 1 R and L and curtsy lunge 15 x on R and the L with increased   Ms Sorlie using cuff weights to LE for added resistance in water for exercises. On edge of pool with bil UE support Pt performed LE exercise  Hip abd/add R/L 10 x each and then using 1 UE support Hip ext/flex with knee straight x 20,   Marching knee/hip 90/90 x 20   ham curl R/L x 20. Hips at 90 degree flexion to 90 degrees abduction for IR/ER AROM of R and then L hip with chest deep water. No pain with any movements in water Squats x 15 reps with intermittent UE support x 2 sets. Lunge to target x 10 on R and the x 10 on LE  Thoracic stretch with R shld to pool wall and book opening stretch forllowed by L shld to pool and book opening stretch   In corner with deep water 5 feet , pt with bil UE support bicycle kicks for 1 minute, then treads water for 1 minutes x 2  Aquastretch for Left quadriceps laterally.  Pt also educated on self Soft tissue mobs for  Knee to decrease pain in knee . After manual pt did not have any pain in knee  Pt requires the buoyancy of water for active assisted exercises with buoyancy supported for strengthening& ROM exercises: PT requires the viscosity of the water forresistance withstrengtheningexercises Hydrostatic pressure also supports joints by unweighting joint load by at least 50 % in 3-4 feet depth water. 80% in chest to neck deep water. Water will allow for reduced gait deviation due to reduced joint loading through buoyancy to help patient improve posture without excess stress and pain.  Water current provides perturbations which challenge standing balance unsupportedt                         PT Education - 03/01/21 1649    Education Details Pt reviewed HEP written and demo in water to reinforce understanding.  Pt /PT plan for DC for community exercises at [facility]) Educated Patient    Methods  Explanation;Demonstration;Tactile cues;Verbal cues;Handout    Comprehension Verbalized understanding;Returned demonstration            PT Short Term Goals - 01/09/21 0913      PT SHORT TERM GOAL #1   Title Pt will demo heel-toe gait pattern    Baseline flat foot strike with antalgic pattern at eval; heel strike initial contact, limited toe off 01/09/21    Time 3    Period Weeks    Status On-going    Target Date 12/30/20      PT SHORT TERM  GOAL #2   Title pt will verbalize ability to use HEP to improve daily mobility of hip joint    Baseline began establishing at eval; patient reports HEP is helping with pain/mobility isn't as consistent as she plans.    Time 3    Period Weeks    Status Achieved    Target Date 12/30/20             PT Long Term Goals - 02/08/21 2144      PT LONG TERM GOAL #1   Title pt will ambulate without antalgic gait pattern with use of LRAD    Baseline Pt now not using AD but has slight antalgic gait pattern    Time 6    Period Weeks    Status On-going    Target Date 03/22/21      PT LONG TERM GOAL #2   Title pain <=2/10 with ADLS    Baseline 4/19 in hip but 3/10 in Left knee 02-08-21    Time 6    Period Weeks    Status On-going    Target Date 03/22/21      PT LONG TERM GOAL #3   Title pt will be independent in long term aquatics strengthening program to continue after d/c    Baseline Pt has been once to the [employer] with sister and is researching community wellness opportunities    Time 6    Period Weeks    Status On-going    Target Date 03/22/21      PT LONG TERM GOAL #4   Title gross hip strength to 4+/5    Baseline 4/5 today    Time 6    Period Weeks    Status On-going    Target Date 03/22/21      PT LONG TERM GOAL #5   Title Pt will be able to demonstrate safe floor to chair transfer without exacerbating pain in hips or knees independently to reduce fear of falling    Baseline Pt is apprehensive about falling and retrieving items  dropped from floor    Time 6    Period Weeks    Status New    Target Date 03/22/21                 Plan - 03/01/21 1653    Clinical Impression Statement Pt entered pool and was educated and given HEP written to review before entering water.  Pt has been attending [facility] with sister and participating in water classes.  Ms Liggett reported no pain after RX session today and PT was able to show pt how to manually work with STW of knee utilizing her own dynamic movement.  Pt appears more confident as she walks into pool area with gait.  Pt is able to benefit from the properties of the water such as hydrostatic pressure to unweight painful joints and utilize the buoyancy of water to increase resistance for muscle strengthening especially posterior chain mx.  Pt questtions were answered about HEP .  Pt will continue water therapy independently at [place] Factors and Comorbidities Comorbidity 2;Time since onset of injury/illness/exacerbation    Comorbidities active CA, OA    Examination-Activity Limitations Locomotion Level;Transfers;Bed Mobility;Sit;Caring for Others;Sleep;Squat;Stairs;Stand    Examination-Participation Restrictions Church;Meal Prep;Cleaning;Occupation;Driving    PT Frequency 1x / week    PT Duration 6 weeks    PT Treatment/Interventions ADLs/Self Care Home Management;Cryotherapy;Gait training;Stair training;Functional mobility training;Therapeutic activities;Therapeutic exercise;Neuromuscular re-education;Manual techniques;Patient/family education;Balance training;Passive range of  motion;Taping;Aquatic Therapy    PT Next Visit Plan cont gait training, gross hip strength. Must have a land visit    PT Home Exercise Plan [id number]       Aquatics  [id number]    Consulted and Agree with Plan of Care Patient           Patient will benefit from skilled therapeutic intervention in order to improve the following deficits and impairments:  Abnormal gait,Decreased range of  motion,Difficulty walking,Increased muscle spasms,Decreased activity tolerance,Pain,Improper body mechanics,Decreased mobility,Decreased strength  Visit Diagnosis: Pain in right hip  Difficulty in walking, not elsewhere classified  Muscle weakness (generalized)  Access Code: [email or web address]: [email or web address]: 03/02/2022Prepared by: Donnetta Simpers BeardsleyExercises  Hamstring Stretch with Noodle - 1 x daily - 7 x weekly - 1 sets - 1-2 reps - 60 hold  Hip Flexor stretch with Noodle - 1 x daily - 7 x weekly - 1 sets - 1-2 reps - 60 hold  Lunge to Target at [place] - 1 x daily - 1-3 x weekly - 2 sets - 10 reps  Forward and Backward Walking Lunge in Shallow Water - 1 x daily - 1-3 x weekly - 2 sets - 10 reps  Forward Jog in Shallow Water - 1 x daily - 7 x weekly - 3 sets - 10 reps  Backward Jog in Shallow Water - 1 x daily - 7 x weekly - 3 sets - 10 reps  Plie Walk in Shallow Water - 1 x daily - 7 x weekly - 3 sets - 10 reps    Problem List Patient Active Problem List   Diagnosis Date Noted  . Paroxysmal atrial fibrillation ([place]) 02/03/2021  . Secondary hypercoagulable state ([place]) 02/03/2021  . Fall 11/29/2020  . Mild anxiety 11/28/2020  . Essential hypertension 11/16/2020  . Cancer associated pain 11/08/2020  . Vitamin D deficiency 10/27/2020  . Pancytopenia, acquired ([place]) 09/12/2020  . Skin rash 08/17/2020  . Metastasis to liver ([place]) 06/14/2020  . Bone pain 05/27/2020  . Goals of care, counseling/discussion 05/16/2020  . Endometrial cancer ([place]) 10/22/2019  . Postmenopausal bleeding 09/02/2019  . Biliary colic 90/30/0923  . Breast pain, left 05/20/2019  . Trapezius muscle spasm 05/11/2019  . Elevated random blood glucose level 02/13/2019  . Epigastric pain 01/29/2019  . Prediabetes 03/18/2017  . Hypertension 03/18/2017  . Beta thalassemia trait 11/25/2013  . Class II obesity 11/19/2012  . Women's annual routine gynecological examination 07/12/2011  .  Gastroesophageal reflux disease 01/28/2007  . Hyperlipidemia 10/10/2006   Voncille Lo, PT, [place] Certified Exercise Expert for the Aging Adult  03/01/21 8:37 PM Phone: [phone] Fax: [phone]  [facility] [address] [place], [place], [address] Phone: [phone]   Fax:  [phone]  Name: Tina Stone MRN: [id number] Date of Birth: 18-Nov-1950

## 2021-03-06 ENCOUNTER — Other Ambulatory Visit: Payer: Self-pay

## 2021-03-06 ENCOUNTER — Ambulatory Visit (HOSPITAL_BASED_OUTPATIENT_CLINIC_OR_DEPARTMENT_OTHER)
Admission: RE | Admit: 2021-03-06 | Discharge: 2021-03-06 | Disposition: A | Discharge status: Home / Self Care | Payer: Medicare Other | Source: Ambulatory Visit | Attending: Physician Assistant | Admitting: Physician Assistant

## 2021-03-06 ENCOUNTER — Encounter (HOSPITAL_COMMUNITY): Payer: Self-pay | Admitting: Physician Assistant

## 2021-03-06 ENCOUNTER — Encounter (HOSPITAL_COMMUNITY): Payer: Self-pay | Admitting: *Deleted

## 2021-03-06 ENCOUNTER — Ambulatory Visit (HOSPITAL_COMMUNITY)
Admission: RE | Admit: 2021-03-06 | Discharge: 2021-03-06 | Disposition: A | Discharge status: Home / Self Care | Payer: Medicare Other | Source: Ambulatory Visit | Attending: Physician Assistant | Admitting: Physician Assistant

## 2021-03-06 ENCOUNTER — Ambulatory Visit: Payer: Medicare Other

## 2021-03-06 VITALS — BP 110/74 | HR 54 | Ht 64.0 in | Wt 195.6 lb

## 2021-03-06 DIAGNOSIS — I10 Essential (primary) hypertension: Secondary | ICD-10-CM | POA: Insufficient documentation

## 2021-03-06 DIAGNOSIS — E669 Obesity, unspecified: Secondary | ICD-10-CM | POA: Diagnosis not present

## 2021-03-06 DIAGNOSIS — Z8589 Personal history of malignant neoplasm of other organs and systems: Secondary | ICD-10-CM | POA: Diagnosis not present

## 2021-03-06 DIAGNOSIS — R9431 Abnormal electrocardiogram [ECG] [EKG]: Secondary | ICD-10-CM | POA: Diagnosis not present

## 2021-03-06 DIAGNOSIS — E785 Hyperlipidemia, unspecified: Secondary | ICD-10-CM | POA: Diagnosis not present

## 2021-03-06 DIAGNOSIS — I480 Paroxysmal atrial fibrillation: Secondary | ICD-10-CM

## 2021-03-06 DIAGNOSIS — Z79899 Other long term (current) drug therapy: Secondary | ICD-10-CM | POA: Insufficient documentation

## 2021-03-06 DIAGNOSIS — R262 Difficulty in walking, not elsewhere classified: Secondary | ICD-10-CM

## 2021-03-06 DIAGNOSIS — Z8249 Family history of ischemic heart disease and other diseases of the circulatory system: Secondary | ICD-10-CM | POA: Diagnosis not present

## 2021-03-06 DIAGNOSIS — Z6833 Body mass index (BMI) 33.0-33.9, adult: Secondary | ICD-10-CM | POA: Insufficient documentation

## 2021-03-06 DIAGNOSIS — M25551 Pain in right hip: Secondary | ICD-10-CM | POA: Diagnosis not present

## 2021-03-06 DIAGNOSIS — Z7901 Long term (current) use of anticoagulants: Secondary | ICD-10-CM | POA: Insufficient documentation

## 2021-03-06 DIAGNOSIS — D6869 Other thrombophilia: Secondary | ICD-10-CM | POA: Insufficient documentation

## 2021-03-06 DIAGNOSIS — M6281 Muscle weakness (generalized): Secondary | ICD-10-CM

## 2021-03-06 LAB — ECHOCARDIOGRAM COMPLETE
Area-P 1/2: 4.49 cm2
Calc EF: 55.9 %
S' Lateral: 2.3 cm
Single Plane A2C EF: 50.7 %
Single Plane A4C EF: 60.4 %

## 2021-03-06 LAB — CBC
HCT: 36 % (ref 36.0–46.0)
Hemoglobin: 11.4 g/dL — ABNORMAL LOW (ref 12.0–15.0)
MCH: 24.9 pg — ABNORMAL LOW (ref 26.0–34.0)
MCHC: 31.7 g/dL (ref 30.0–36.0)
MCV: 78.6 fL — ABNORMAL LOW (ref 80.0–100.0)
Platelets: 260 10*3/uL (ref 150–400)
RBC: 4.58 MIL/uL (ref 3.87–5.11)
RDW: 19 % — ABNORMAL HIGH (ref 11.5–15.5)
WBC: 4 10*3/uL (ref 4.0–10.5)
nRBC: 0 % (ref 0.0–0.2)

## 2021-03-06 NOTE — Therapy (Signed)
[place], [place], [address] Phone: [phone]   Fax:  [phone]  Physical Therapy Treatment/Discharge  Patient Details  Name: Tina Stone MRN: [id number] Date of Birth: 1950/02/15 Referring Provider (PT): Isaac Bliss, Rayford Halsted, MD   Encounter Date: 03/06/2021   PT End of Session - 03/06/21 0714    Visit Number 8    Number of Visits 12    Date for PT Re-Evaluation 03/22/21    Authorization Type UHC MCR    Authorization Time Period FOTO visit 6, PN visit 10    PT Start Time 0715    PT Stop Time 0741    PT Time Calculation (min) 26 min    Activity Tolerance Patient tolerated treatment well    Behavior During Therapy [facility] for tasks assessed/performed           Past Medical History:  Diagnosis Date  . Adenomatous colon polyp   . Allergic rhinitis, seasonal   . Allergy   . Beta thalassemia trait 11/25/2013  . Cholelithiasis   . Class 3 obesity without serious comorbidity with body mass index (BMI) of 40.0 to 44.9 in adult 11/19/2012  . endometrial ca dx'd 08/2009   endometrial   . GERD (gastroesophageal reflux disease)   . HLD (hyperlipidemia)   . Hypercholesterolemia   . Hypertension 03/18/2017   no meds   . Intraductal papilloma of left breast    Patient underwent left needle-localized lumpectomy by Dr. Imogene Burn. Tsuei on 09/09/2013; pathology showed intraductal papilloma with no atypia or malignancy identified.  Marland Kitchen PONV (postoperative nausea and vomiting)   . Pre-diabetes    pt denies  . Uterine fibroid     Past Surgical History:  Procedure Laterality Date  . [place]   right  . BREAST EXCISIONAL BIOPSY Left 2014   benign  . BREAST LUMPECTOMY WITH NEEDLE LOCALIZATION Left 09/09/2013   Procedure: BREAST LUMPECTOMY WITH NEEDLE LOCALIZATION;  Surgeon: Imogene Burn. Georgette Dover, MD;  Location: [place];  Service: General;  Laterality: Left;  . CHOLECYSTECTOMY    .  COLONOSCOPY    . IR IMAGING GUIDED PORT INSERTION  05/18/2020  . POLYPECTOMY    . ROBOTIC ASSISTED LAPAROSCOPIC CHOLECYSTECTOMY  09/09/2019  . ROBOTIC ASSISTED TOTAL HYSTERECTOMY WITH BILATERAL SALPINGO OOPHERECTOMY N/A 10/22/2019   Procedure: XI ROBOTIC ASSISTED TOTAL HYSTERECTOMY WITH BILATERAL SALPINGO OOPHORECTOMY GREATER THAN 250 GRAMS, MINI LAPARTOMY FOR SPECIMEN DELIVERY; PELVIC AND PERI-AORTIC LYMPHADENECTOMY;  Surgeon: Everitt Amber, MD;  [facility];  Service: Gynecology;  Laterality: N/A;  . SENTINEL NODE BIOPSY N/A 10/22/2019   Procedure: SENTINEL NODE BIOPSY;  Surgeon: Everitt Amber, MD;  [facility];  Service: Gynecology;  Laterality: N/A;    There were no vitals filed for this visit.   Subjective Assessment - 03/06/21 0718    Subjective I don't have any hip pain, but some in my Lt knee maybe arthritis only when I move it. I plan to continue going to the [facility] after PT.    Patient Stated Goals decrease pain, improve mobility, swimming & walking    Currently in Pain? No/denies              [facility] PT Assessment - 03/06/21 0001      Observation/Other Assessments   Focus on Therapeutic Outcomes (FOTO)  99%      Strength   Overall Strength Comments gross 4+/5      Palpation   Palpation comment mild TTP greater  trochanter      Ambulation/Gait   Gait Comments excessive frontal plane movement                         [facility] Adult PT Treatment/Exercise - 03/06/21 0001      Transfers   Transfers Floor to Transfer    Floor to Transfer 7: Independent    Number of Reps 1 set    Transfer Cueing verbal cues for sequencing      Self-Care   Other Self-Care Comments  see patient education                  PT Education - 03/06/21 0748    Education Details D/C education. review HEP and issued green theraband. FOTO results.    Person(s) Educated Patient    Methods Explanation;Demonstration;Handout    Comprehension Verbalized understanding;Returned  demonstration            PT Short Term Goals - 03/06/21 0743      PT SHORT TERM GOAL #1   Title Pt will demo heel-toe gait pattern    Baseline flat foot strike with antalgic pattern at eval; heel strike initial contact, limited toe off 01/09/21    Time 3    Period Weeks    Status Achieved    Target Date 12/30/20      PT SHORT TERM GOAL #2   Title pt will verbalize ability to use HEP to improve daily mobility of hip joint    Baseline consistent with aquatic HEP    Time 3    Period Weeks    Status Achieved    Target Date 12/30/20             PT Long Term Goals - 03/06/21 0724      PT LONG TERM GOAL #1   Title pt will ambulate without antalgic gait pattern with use of LRAD    Baseline Pt now not using AD but has slight antalgic gait pattern    Time 6    Period Weeks    Status On-going      PT LONG TERM GOAL #2   Title pain <=2/10 with ADLS    Baseline 7/67 in hip but 1/10 in Left knee 03/06/21    Time 6    Period Weeks    Status Achieved      PT LONG TERM GOAL #3   Title pt will be independent in long term aquatics strengthening program to continue after d/c    Baseline attends [facility], independent with HEP.    Time 6    Period Weeks    Status Achieved      PT LONG TERM GOAL #4   Title gross hip strength to 4+/5    Baseline 4+/5 today    Time 6    Period Weeks    Status Achieved      PT LONG TERM GOAL #5   Title Pt will be able to demonstrate safe floor to chair transfer without exacerbating pain in hips or knees independently to reduce fear of falling    Baseline able to demo    Time 6    Period Weeks    Status Achieved                 Plan - 03/06/21 0723    Clinical Impression Statement Patient has progressed well throughout her duration of care having nearly met all established functional goals. She demonstrates improvements  in hip pain, LE strength and gait mechanics with mild antalgic gait present. She is independent with HEP and has plans to  continue aquatic strengthening and is therefore appropriate for D/C at this time.    Personal Factors and Comorbidities Comorbidity 2;Time since onset of injury/illness/exacerbation    Comorbidities active CA, OA    Examination-Activity Limitations --    Examination-Participation Restrictions --    PT Frequency --    PT Duration --    PT Treatment/Interventions ADLs/Self Care Home Management;Cryotherapy;Gait training;Stair training;Functional mobility training;Therapeutic activities;Therapeutic exercise;Neuromuscular re-education;Manual techniques;Patient/family education;Balance training;Passive range of motion;Taping;Aquatic Therapy    PT Next Visit Plan --    PT Home Exercise Plan [email or web address]       Aquatics  [email or web address]    Consulted and Agree with Plan of Care Patient           Patient will benefit from skilled therapeutic intervention in order to improve the following deficits and impairments:  Abnormal gait,Decreased range of motion,Difficulty walking,Increased muscle spasms,Decreased activity tolerance,Pain,Improper body mechanics,Decreased mobility,Decreased strength  Visit Diagnosis: Pain in right hip  Difficulty in walking, not elsewhere classified  Muscle weakness (generalized)     Problem List Patient Active Problem List   Diagnosis Date Noted  . Paroxysmal atrial fibrillation ([place]) 02/03/2021  . Secondary hypercoagulable state ([place]) 02/03/2021  . Fall 11/29/2020  . Mild anxiety 11/28/2020  . Essential hypertension 11/16/2020  . Cancer associated pain 11/08/2020  . Vitamin D deficiency 10/27/2020  . Pancytopenia, acquired ([place]) 09/12/2020  . Skin rash 08/17/2020  . Metastasis to liver ([place]) 06/14/2020  . Bone pain 05/27/2020  . Goals of care, counseling/discussion 05/16/2020  . Endometrial cancer ([place]) 10/22/2019  . Postmenopausal bleeding 09/02/2019  . Biliary colic 14/38/8875  . Breast pain, left 05/20/2019  . Trapezius muscle spasm 05/11/2019  . Elevated random  blood glucose level 02/13/2019  . Epigastric pain 01/29/2019  . Prediabetes 03/18/2017  . Hypertension 03/18/2017  . Beta thalassemia trait 11/25/2013  . Class II obesity 11/19/2012  . Women's annual routine gynecological examination 07/12/2011  . Gastroesophageal reflux disease 01/28/2007  . Hyperlipidemia 10/10/2006   PHYSICAL THERAPY DISCHARGE SUMMARY  Visits from Start of Care: 8  Current functional level related to goals / functional outcomes: See above   Remaining deficits: [place] above   Education / Equipment: See above   Plan: Patient agrees to discharge.  Patient goals were partially met. Patient is being discharged due to being pleased with the current functional level.  ?????          Gwendolyn Grant, PT, DPT, ATC 03/06/21 7:54 AM [facility] [address] [place], [place], [address] Phone: [phone]   Fax:  [phone]  Name: Babbette Dalesandro MRN: [id number] Date of Birth: 09/21/1950

## 2021-03-06 NOTE — Progress Notes (Signed)
Primary Care Physician: Isaac Bliss, Rayford Halsted, MD Primary Cardiologist: none Primary Electrophysiologist: none Referring Physician: [facility]   Tina Stone is a 71 y.o. female with a history of endometrial cancer followed by Dr Alvy Bimler, HTN, and atrial fibrillation who presents for follow up in the [facility]. The patient was initially diagnosed with atrial fibrillation 01/28/21 after presenting with symptoms of palpitations. EMS reports the patient was initially in SVT with a fast heart rate close to 200.  Patient was given adenosine multiple times and was noted to be in atrial fibrillation.  Patient was started on Cardizem and she converted to sinus tachycardia. Patient is on Eliquis for a CHADS2VASC score of 3. She returned to the ED 01/30/21 with blood on tissue after BM. Negative hemoccult in ED. Her Eliquis was held. She denies any snoring or alcohol use.   On follow up today, patient reports she has done well since her last visit. She denies any tachypalpitations. She denies any bleeding issues on anticoagulation. She had a echo this morning and the results are pending.   Today, she denies symptoms of palpitations, chest pain, shortness of breath, orthopnea, PND, lower extremity edema, dizziness, presyncope, syncope, snoring, daytime somnolence, bleeding, or neurologic sequela. The patient is tolerating medications without difficulties and is otherwise without complaint today.    Atrial Fibrillation Risk Factors:  she does not have symptoms or diagnosis of sleep apnea. she does not have a history of rheumatic fever. she does not have a history of alcohol use. The patient does not have a history of early familial atrial fibrillation or other arrhythmias.  she has a BMI of Body mass index is 33.57 kg/m.Marland Kitchen Filed Weights   03/06/21 1012  Weight: 88.7 kg    Family History  Problem Relation Age of Onset  . Diabetes Mother   . Hypertension  Mother   . Colon polyps Mother 11  . Dementia Mother 46  . Diabetes Father   . Congestive Heart Failure Father   . Pancreatic cancer Paternal Aunt   . Colon cancer Neg Hx   . Breast cancer Neg Hx   . Lung cancer Neg Hx   . Esophageal cancer Neg Hx   . Rectal cancer Neg Hx   . Stomach cancer Neg Hx      Atrial Fibrillation Management history:  Previous antiarrhythmic drugs: none Previous cardioversions: none Previous ablations: none CHADS2VASC score: 3 Anticoagulation history: Eliquis   Past Medical History:  Diagnosis Date  . Adenomatous colon polyp   . Allergic rhinitis, seasonal   . Allergy   . Beta thalassemia trait 11/25/2013  . Cholelithiasis   . Class 3 obesity without serious comorbidity with body mass index (BMI) of 40.0 to 44.9 in adult 11/19/2012  . endometrial ca dx'd 08/2009   endometrial   . GERD (gastroesophageal reflux disease)   . HLD (hyperlipidemia)   . Hypercholesterolemia   . Hypertension 03/18/2017   no meds   . Intraductal papilloma of left breast    Patient underwent left needle-localized lumpectomy by Dr. Imogene Burn. Tsuei on 09/09/2013; pathology showed intraductal papilloma with no atypia or malignancy identified.  Marland Kitchen PONV (postoperative nausea and vomiting)   . Pre-diabetes    pt denies  . Uterine fibroid    Past Surgical History:  Procedure Laterality Date  . [place]   right  . BREAST EXCISIONAL BIOPSY Left 2014   benign  . BREAST LUMPECTOMY WITH NEEDLE  LOCALIZATION Left 09/09/2013   Procedure: BREAST LUMPECTOMY WITH NEEDLE LOCALIZATION;  Surgeon: Imogene Burn. Georgette Dover, MD;  Location: [facility];  Service: General;  Laterality: Left;  . CHOLECYSTECTOMY    . COLONOSCOPY    . IR IMAGING GUIDED PORT INSERTION  05/18/2020  . POLYPECTOMY    . ROBOTIC ASSISTED LAPAROSCOPIC CHOLECYSTECTOMY  09/09/2019  . ROBOTIC ASSISTED TOTAL HYSTERECTOMY WITH BILATERAL SALPINGO OOPHERECTOMY N/A 10/22/2019   Procedure: XI ROBOTIC ASSISTED TOTAL  HYSTERECTOMY WITH BILATERAL SALPINGO OOPHORECTOMY GREATER THAN 250 GRAMS, MINI LAPARTOMY FOR SPECIMEN DELIVERY; PELVIC AND PERI-AORTIC LYMPHADENECTOMY;  Surgeon: Everitt Amber, MD;  Location: [facility];  Service: Gynecology;  Laterality: N/A;  . SENTINEL NODE BIOPSY N/A 10/22/2019   Procedure: SENTINEL NODE BIOPSY;  Surgeon: Everitt Amber, MD;  Location: [facility];  Service: Gynecology;  Laterality: N/A;    Current Outpatient Medications  Medication Sig Dispense Refill  . acetaminophen (TYLENOL) 500 MG tablet Take 1,000 mg by mouth every 6 (six) hours as needed for moderate pain.    Marland Kitchen ALPRAZolam (XANAX) 0.25 MG tablet Take 1 tablet (0.25 mg total) by mouth 2 (two) times daily as needed for anxiety. 30 tablet 0  . apixaban (ELIQUIS) 5 MG TABS tablet Take 1 tablet (5 mg total) by mouth 2 (two) times daily. 60 tablet 3  . APPLE CIDER VINEGAR PO Take 5 mLs by mouth daily.     Marland Kitchen atenolol (TENORMIN) 25 MG tablet Take 1 tablet (25 mg total) by mouth daily. 30 tablet 3  . Blood Pressure Monitoring (BLOOD PRESSURE KIT) DEVI 1 Units by Does not apply route every morning. 1 each 0  . loratadine (CLARITIN) 10 MG tablet Take 10 mg by mouth daily as needed for allergies.    . Multiple Vitamin (MULTIVITAMIN) capsule Take 1 capsule by mouth daily.    . ondansetron (ZOFRAN) 8 MG tablet Take 1 tablet (8 mg total) by mouth every 8 (eight) hours as needed. Start on the third day after chemotherapy. 30 tablet 1  . oxyCODONE (OXY IR/ROXICODONE) 5 MG immediate release tablet Take 1 tablet (5 mg total) by mouth every 4 (four) hours as needed for severe pain. 30 tablet 0  . polyethylene glycol (MIRALAX / GLYCOLAX) 17 g packet Take 17 g by mouth daily as needed for mild constipation.    . prochlorperazine (COMPAZINE) 10 MG tablet Take 1 tablet (10 mg total) by mouth every 6 (six) hours as needed (Nausea or vomiting). 30 tablet 1   No current facility-administered medications for this encounter.    Allergies  Allergen Reactions   . Benicar Hct [Olmesartan Medoxomil-Hctz] Shortness Of Breath and Palpitations  . Iodinated Diagnostic Agents Hives and Itching    02/02/2021-  pt developed 2 hives and itching even with the 13hr premedication.  Radiology PA came into eval the pt.  Developed itching and hives after injection on 05/10/20; needs 13hr prep in future  . Bactrim [Sulfamethoxazole-Trimethoprim] Other (See Comments)    Abdominal pain, dizziness  . Pravastatin Other (See Comments)    Lower abdominal pain  . Shellfish Allergy Nausea And Vomiting  . Amlodipine Palpitations    Social History   Socioeconomic History  . Marital status: Widowed    Spouse name: Not on file  . Number of children: Not on file  . Years of education: Not on file  . Highest education level: Not on file  Occupational History  . Not on file  Tobacco Use  . Smoking status: Never Smoker  . Smokeless tobacco: Never  Used  . Tobacco comment: few puffs but not a true smoker quit many yrs ago  Vaping Use  . Vaping Use: Never used  Substance and Sexual Activity  . Alcohol use: Never  . Drug use: Never  . Sexual activity: Not Currently  Other Topics Concern  . 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  Physical Exam: Vitals:   03/06/21 1012  BP: 110/74  Pulse: (!) 54  Weight: 88.7 kg  Height: 5' 4" (1.626 m)    GEN- The patient is a well appearing obese female, alert and oriented x 3 today.   HEENT-head normocephalic, atraumatic, sclera clear, conjunctiva pink,  hearing intact, trachea midline. Lungs- Clear to ausculation bilaterally, normal work of breathing Heart- Regular rate and rhythm, bradycardia, no murmurs, rubs or gallops  GI- soft, NT, ND, + BS Extremities- no clubbing, cyanosis, or edema MS- no significant deformity or atrophy Skin- no rash or lesion Psych- euthymic mood, full affect Neuro- strength and sensation are intact   Wt Readings from Last 3 Encounters:  03/06/21 88.7 kg  02/03/21 85 kg  02/02/21 85.1 kg    EKG today demonstrates  SB Vent. rate 54 BPM PR interval 194 ms QRS duration 78 ms QT/QTc 428/405 ms  Epic records are reviewed at length today  CHA2DS2-VASc Score = 3  The patient's score is based upon: CHF History: No HTN History: Yes Diabetes History: No Stroke History: No Vascular Disease History: No Age Score: 1 Gender Score: 1      ASSESSMENT AND PLAN: 1. Paroxysmal Atrial Fibrillation (ICD10:  I48.0) The patient's CHA2DS2-VASc score is 3, indicating a 3.2% annual risk of stroke.   Patient appears to be maintaining SR. Continue Eliquis 5 mg BID. No bleeding issues. Check cbc Echo results pending Continue atenolol 25 mg daily  2. Secondary Hypercoagulable State (ICD10:  D68.69) The patient is at significant risk for stroke/thromboembolism based upon her CHA2DS2-VASc Score of 3.  Continue Apixaban (Eliquis).   3. Obesity Body mass index is 33.57 kg/m. Lifestyle modification was discussed and encouraged including regular physical activity and weight reduction.  4. HTN Stable, no changes today.   Follow up in the AF clinic in 3 months.    [facility] [address] [place], [place] [address] [phone] 03/06/2021 10:39 AM

## 2021-03-06 NOTE — Progress Notes (Signed)
  Echocardiogram 2D Echocardiogram has been performed.  Tina Stone 03/06/2021, 9:56 AM

## 2021-03-22 ENCOUNTER — Encounter: Payer: Self-pay | Admitting: Hematology and Oncology

## 2021-03-22 ENCOUNTER — Inpatient Hospital Stay: Payer: Medicare Other

## 2021-03-22 ENCOUNTER — Telehealth: Payer: Self-pay | Admitting: Hematology and Oncology

## 2021-03-22 ENCOUNTER — Inpatient Hospital Stay: Payer: Medicare Other | Attending: Gynecologic Oncology | Admitting: Hematology and Oncology

## 2021-03-22 ENCOUNTER — Other Ambulatory Visit: Payer: Self-pay

## 2021-03-22 VITALS — BP 143/79 | HR 52 | Temp 97.9°F | Resp 18 | Ht 64.0 in | Wt 197.6 lb

## 2021-03-22 DIAGNOSIS — I10 Essential (primary) hypertension: Secondary | ICD-10-CM | POA: Diagnosis not present

## 2021-03-22 DIAGNOSIS — C541 Malignant neoplasm of endometrium: Secondary | ICD-10-CM

## 2021-03-22 DIAGNOSIS — D563 Thalassemia minor: Secondary | ICD-10-CM | POA: Diagnosis not present

## 2021-03-22 DIAGNOSIS — Z7189 Other specified counseling: Secondary | ICD-10-CM

## 2021-03-22 LAB — CBC WITH DIFFERENTIAL (CANCER CENTER ONLY)
Abs Immature Granulocytes: 0.03 10*3/uL (ref 0.00–0.07)
Basophils Absolute: 0.1 10*3/uL (ref 0.0–0.1)
Basophils Relative: 1 %
Eosinophils Absolute: 0.3 10*3/uL (ref 0.0–0.5)
Eosinophils Relative: 6 %
HCT: 33.6 % — ABNORMAL LOW (ref 36.0–46.0)
Hemoglobin: 11.1 g/dL — ABNORMAL LOW (ref 12.0–15.0)
Immature Granulocytes: 1 %
Lymphocytes Relative: 32 %
Lymphs Abs: 1.4 10*3/uL (ref 0.7–4.0)
MCH: 24.9 pg — ABNORMAL LOW (ref 26.0–34.0)
MCHC: 33 g/dL (ref 30.0–36.0)
MCV: 75.5 fL — ABNORMAL LOW (ref 80.0–100.0)
Monocytes Absolute: 0.3 10*3/uL (ref 0.1–1.0)
Monocytes Relative: 6 %
Neutro Abs: 2.5 10*3/uL (ref 1.7–7.7)
Neutrophils Relative %: 54 %
Platelet Count: 230 10*3/uL (ref 150–400)
RBC: 4.45 MIL/uL (ref 3.87–5.11)
RDW: 19.1 % — ABNORMAL HIGH (ref 11.5–15.5)
WBC Count: 4.5 10*3/uL (ref 4.0–10.5)
nRBC: 0 % (ref 0.0–0.2)

## 2021-03-22 LAB — CMP (CANCER CENTER ONLY)
ALT: 18 U/L (ref 0–44)
AST: 28 U/L (ref 15–41)
Albumin: 3.4 g/dL — ABNORMAL LOW (ref 3.5–5.0)
Alkaline Phosphatase: 65 U/L (ref 38–126)
Anion gap: 10 (ref 5–15)
BUN: 12 mg/dL (ref 8–23)
CO2: 26 mmol/L (ref 22–32)
Calcium: 9.1 mg/dL (ref 8.9–10.3)
Chloride: 103 mmol/L (ref 98–111)
Creatinine: 0.96 mg/dL (ref 0.44–1.00)
GFR, Estimated: >60 mL/min (ref >60)
Glucose, Bld: 89 mg/dL (ref 70–99)
Potassium: 4 mmol/L (ref 3.5–5.1)
Sodium: 139 mmol/L (ref 135–145)
Total Bilirubin: 0.6 mg/dL (ref 0.3–1.2)
Total Protein: 7 g/dL (ref 6.5–8.1)

## 2021-03-22 MED ORDER — PREDNISONE 50 MG PO TABS: ORAL_TABLET | ORAL | 0 refills | Status: DC

## 2021-03-22 MED ORDER — HEPARIN SOD (PORK) LOCK FLUSH 100 UNIT/ML IV SOLN
250.0000 [IU] | Freq: Once | INTRAVENOUS | Status: AC
  Administered 2021-03-22: 500 [IU]
  Filled 2021-03-22: qty 5

## 2021-03-22 MED ORDER — SODIUM CHLORIDE 0.9% FLUSH
10.0000 mL | Freq: Once | INTRAVENOUS | Status: AC
  Administered 2021-03-22: 10 mL
  Filled 2021-03-22: qty 10

## 2021-03-22 NOTE — Assessment & Plan Note (Signed)
She has thalassemia trait I plan to order iron studies just to make sure she is not iron deficient

## 2021-03-22 NOTE — Assessment & Plan Note (Signed)
She is completely asymptomatic Per previous discussion, she is currently on treatment break and feels great I plan to repeat CT imaging in May for objective assessment Due to contrast allergy, I have prescribed premedication prednisone.  I gave the patient instructions of when to take her prednisone along with Benadryl prior to her imaging study

## 2021-03-22 NOTE — Telephone Encounter (Signed)
Scheduled appts per 3/23 sch msg. Pt aware.

## 2021-03-22 NOTE — Progress Notes (Signed)
[place] OFFICE PROGRESS NOTE  Patient Care Team: Isaac Bliss, Rayford Halsted, MD as PCP - General (Internal Medicine) Axel Filler, MD  ASSESSMENT & PLAN:  Endometrial cancer [facility]) She is completely asymptomatic Per previous discussion, she is currently on treatment break and feels great I plan to repeat CT imaging in May for objective assessment Due to contrast allergy, I have prescribed premedication prednisone.  I gave the patient instructions of when to take her prednisone along with Benadryl prior to her imaging study  Beta thalassemia trait She has thalassemia trait I plan to order iron studies just to make sure she is not iron deficient  Essential hypertension Her blood pressure is now well controlled She will continue medical management   Orders Placed This Encounter  Procedures  . CT ABDOMEN PELVIS W CONTRAST    Standing Status:   Future    Standing Expiration Date:   03/22/2022    Order Specific Question:   If indicated for the ordered procedure, I authorize the administration of contrast media per Radiology protocol    Answer:   Yes    Order Specific Question:   Preferred imaging location?    Answer:   [facility]    Order Specific Question:   Radiology Contrast Protocol - do NOT remove file path    Answer:   [email or web address]  . Iron and TIBC    Standing Status:   Future    Standing Expiration Date:   03/22/2022  . Ferritin    Standing Status:   Future    Standing Expiration Date:   03/22/2022    All questions were answered. The patient knows to call the clinic with any problems, questions or concerns. The total time spent in the appointment was 20 minutes encounter with patients including review of chart and various tests results, discussions about plan of care and coordination of care plan   [name], MD 03/22/2021 11:17 AM  INTERVAL HISTORY: Please see below for problem oriented  charting. She returns for further follow-up She is doing well after she came off all treatment Her blood pressure control is satisfactory She denies abdominal pain, nausea or changes in bowel habits No vaginal bleeding  SUMMARY OF ONCOLOGIC HISTORY: Oncology History Overview Note  Poorly differentiated carcinoma, mixed histology with squamous differentiation, rare focus of clear cells as well as serous features MSI stable Her2 negative   Endometrial cancer ([place])  09/01/2019 Initial Diagnosis   The patient reported a history of postmenopausal bleeding that began 1 to 2 months before diagnosis   09/14/2019 Imaging   US pelvis 1. Enlarged uterus with numerous myometrial masses, presumably fibroids. 2. Endometrial thickness of 6.2 mm. In the setting of post-menopausal bleeding, endometrial sampling is indicated to exclude carcinoma. If results are benign, sonohysterogram should be considered for focal lesion work-up.  3. Nonvisualized ovaries   09/23/2019 Pathology Results   A. ENDOMETRIUM, BIOPSY:  - Poorly differentiated carcinoma   10/15/2019 Imaging   Ct scan of chest, abdomen and pelvis: No evidence of metastatic disease within the chest, abdomen, or pelvis.   Enlarged fibroid uterus.   Colonic diverticulosis. No radiographic evidence of diverticulitis.   Aortic and coronary artery atherosclerosis.     10/22/2019 Pathology Results   SURGICAL PATHOLOGY   FINAL MICROSCOPIC DIAGNOSIS:   A. UTERUS, BILATERAL TUBES AND OVARIES, HYSTERECTOMY:  Poorly differentiated carcinoma, 6.5 cm.  Lymphovascular involvement by tumor.  Carcinoma involves inner half of the myometrium.  Margins not involved.  Cervix, bilateral fallopian tubes and bilateral ovaries free of tumor.   B. LYMPH NODE, RIGHT EXTERNAL SENTINEL, BIOPSY:  One lymph node with no metastatic carcinoma (0/1).   C. LYMPH NODE, RIGHT PELVIC, BIOPSY  Five lymph nodes with no metastatic carcinoma (0/5).   D. LYMPH NODE,  LEFT PELVIC, BIOPSY:  Five lymph nodes with no metastatic carcinoma (0/5).   E. LYMPH NODE, RIGHT PERI AORTIC, BIOPSY:  One lymph node with no metastatic carcinoma (0/1).   F. LYMPH NODE, LEFT PERI AORTIC, BIOPSY:  Five lymph nodes with no metastatic carcinoma (0/5).    ONCOLOGY TABLE:  UTERUS, CARCINOMA OR CARCINOSARCOMA   Procedure: Total hysterectomy with bilateral f-oophorectomy and sentinel  lymph nodes.  Histologic type: Poorly differentiated carcinoma, see comment.  Histologic Grade: High-grade, FIGO 3.  Myometrial invasion:       Depth of invasion: 13 mm       Myometrial thickness: 40 mm  Uterine Serosa Involvement: Not identified  Cervical stromal involvement: Not identified  Extent of involvement of other organs: Not identified  Lymphovascular invasion: Present  Regional Lymph Nodes:       Examined:     17 Sentinel                               0 non-sentinel                               17 total        Lymph nodes with metastasis: 0        Isolated tumor cells (<0.2 mm): 0        Micrometastasis:  (>0.2 mm and < 2.0 mm): 0        Macrometastasis: (>2.0 mm): 0  Representative Tumor Block: A5, A6, A7 and A8.  MMR / MSI testing: Pending  Pathologic Stage Classification (pTNM, AJCC 8th edition):  pT1a, pN0  Comments: The carcinoma is a high-grade poorly differentiated carcinoma which morphologically has predominantly serous features.  There are a few foci with squamous differentiation and a rare focus of clear cell features.  Immunohistochemistry for cytokeratin AE1/AE3 is performed on the sentinel lymph nodes and no positivity is identified.    10/22/2019 Surgery   Pre-operative Diagnosis: endometrial cancer grade 3   Post-operative Diagnosis: same,    Operation: Robotic-assisted laparoscopic total hysterectomy for uterus >250gm with bilateral salpingoophorectomy, SLN mapping, bilateral pelvic and para-aortic lymphadenectomy.   Surgeon: Donaciano Eva     Operative Findings:  : 16cm bulky fibroid uterus, normal ovaries bilaterally, no suspicious lymph nodes.     11/10/2019 Cancer Staging   Staging form: Corpus Uteri - Carcinoma and Carcinosarcoma, AJCC 8th Edition - Pathologic: Stage IVB (pT1a, pN0, cM1) - Signed by [name], MD on 05/16/2020   05/10/2020 Imaging   1. New hypodense 2.0 cm segment 4A left liver lobe mass, suspicious for hepatic metastasis. 2. New left pelvic sidewall 1.4 cm soft tissue nodule, suspicious for left internal iliac nodal metastasis. 3. New left vaginal cuff 1.6 x 1.3 cm soft tissue nodule, suspicious for recurrent tumor. 4. Aortic Atherosclerosis (ICD10-I70.0).   05/18/2020 Procedure   Successful placement of a right internal jugular approach power injectable Port-A-Cath. The catheter is ready for immediate use.   05/23/2020 -  Chemotherapy   The patient had carboplatin and taxol for chemotherapy treatment.     07/28/2020 Imaging  1. No findings identified to suggest recurrent or metastatic disease. 2. Indeterminate and slightly exophytic lesion arising from the inferior pole of left kidney measures 0.8 cm. Technically this is too small to reliably characterize. Attention on follow-up imaging is advised. 3. Aortic atherosclerosis and LAD coronary artery atherosclerotic calcifications.   11/07/2020 Imaging   1. Interval progression of right pelvic sidewall lymphadenopathy, highly concerning for disease progression. PET-CT may prove helpful to further evaluate as clinically warranted. 2. Interval resolution of the previously identified hypodense lesion in segment IVA of the liver. 3. Stable 7 mm subcapsular lesion in the lower pole left kidney with attenuation higher than would be expected for a simple cyst but is too small to reliably characterize. Attention on follow-up recommended. 4. Aortic Atherosclerosis (ICD10-I70.0).   11/16/2020 - 01/04/2021 Chemotherapy         02/02/2021 Imaging   1. Significant  interval decrease in size in right pelvic sidewall, iliac, and inguinal lymph nodes, consistent with treatment response of nodal metastatic disease. 2. Multiple low-attenuation lesions throughout the liver, unchanged compared to prior examination, the majority of these consistent with simple cysts. A previously noted metastatic lesion of anterior hepatic segment VII remains resolved. No new lesions. Findings are consistent with sustained treatment response of hepatic metastatic disease. 3. Status post hysterectomy. Unchanged post treatment appearance of left vaginal cuff and pelvic sidewall soft tissue, consistent with sustained treatment response. 4. No evidence of metastatic disease in the chest.   Aortic Atherosclerosis (ICD10-I70.0).       REVIEW OF SYSTEMS:   Constitutional: Denies fevers, chills or abnormal weight loss Eyes: Denies blurriness of vision Ears, nose, mouth, throat, and face: Denies mucositis or sore throat Respiratory: Denies cough, dyspnea or wheezes Cardiovascular: Denies palpitation, chest discomfort or lower extremity swelling Gastrointestinal:  Denies nausea, heartburn or change in bowel habits Skin: Denies abnormal skin rashes Lymphatics: Denies new lymphadenopathy or easy bruising Neurological:Denies numbness, tingling or new weaknesses Behavioral/Psych: Mood is stable, no new changes  All other systems were reviewed with the patient and are negative.  I have reviewed the past medical history, past surgical history, social history and family history with the patient and they are unchanged from previous note.  ALLERGIES:  is allergic to benicar hct [olmesartan medoxomil-hctz], iodinated diagnostic agents, bactrim [sulfamethoxazole-trimethoprim], pravastatin, shellfish allergy, and amlodipine.  MEDICATIONS:  Current Outpatient Medications  Medication Sig Dispense Refill  . predniSONE (DELTASONE) 50 MG tablet Take 1 pill at 13 hours, 7 hours and 1 hour before CT  scan 3 tablet 0  . acetaminophen (TYLENOL) 500 MG tablet Take 1,000 mg by mouth every 6 (six) hours as needed for moderate pain.    Marland Kitchen ALPRAZolam (XANAX) 0.25 MG tablet Take 1 tablet (0.25 mg total) by mouth 2 (two) times daily as needed for anxiety. 30 tablet 0  . apixaban (ELIQUIS) 5 MG TABS tablet Take 1 tablet (5 mg total) by mouth 2 (two) times daily. 60 tablet 3  . APPLE CIDER VINEGAR PO Take 5 mLs by mouth daily.     Marland Kitchen atenolol (TENORMIN) 25 MG tablet Take 1 tablet (25 mg total) by mouth daily. 30 tablet 3  . Blood Pressure Monitoring (BLOOD PRESSURE KIT) DEVI 1 Units by Does not apply route every morning. 1 each 0  . loratadine (CLARITIN) 10 MG tablet Take 10 mg by mouth daily as needed for allergies.    . Multiple Vitamin (MULTIVITAMIN) capsule Take 1 capsule by mouth daily.    . ondansetron (ZOFRAN) 8  MG tablet Take 1 tablet (8 mg total) by mouth every 8 (eight) hours as needed. Start on the third day after chemotherapy. 30 tablet 1  . polyethylene glycol (MIRALAX / GLYCOLAX) 17 g packet Take 17 g by mouth daily as needed for mild constipation.    . prochlorperazine (COMPAZINE) 10 MG tablet Take 1 tablet (10 mg total) by mouth every 6 (six) hours as needed (Nausea or vomiting). 30 tablet 1   No current facility-administered medications for this visit.    PHYSICAL EXAMINATION: ECOG PERFORMANCE STATUS: 0 - Asymptomatic  Vitals:   03/22/21 1037  BP: (!) 143/79  Pulse: (!) 52  Resp: 18  Temp: 97.9 F (36.6 C)  SpO2: 100%   Filed Weights   03/22/21 1037  Weight: 197 lb 9.6 oz (89.6 kg)    GENERAL:alert, no distress and comfortable SKIN: skin color, texture, turgor are normal, no rashes or significant lesions EYES: normal, Conjunctiva are pink and non-injected, sclera clear OROPHARYNX:no exudate, no erythema and lips, buccal mucosa, and tongue normal  NECK: supple, thyroid normal size, non-tender, without nodularity LYMPH:  no palpable lymphadenopathy in the cervical,  axillary or inguinal LUNGS: clear to auscultation and percussion with normal breathing effort HEART: regular rate & rhythm and no murmurs and no lower extremity edema ABDOMEN:abdomen soft, non-tender and normal bowel sounds Musculoskeletal:no cyanosis of digits and no clubbing  NEURO: alert & oriented x 3 with fluent speech, no focal motor/sensory deficits  LABORATORY DATA:  I have reviewed the data as listed    Component Value Date/Time   NA 139 03/22/2021 1020   NA 140 07/04/2017 0952   K 4.0 03/22/2021 1020   CL 103 03/22/2021 1020   CO2 26 03/22/2021 1020   GLUCOSE 89 03/22/2021 1020   BUN 12 03/22/2021 1020   BUN 10 07/04/2017 0952   CREATININE 0.96 03/22/2021 1020   CREATININE 0.81 03/23/2015 0946   CALCIUM 9.1 03/22/2021 1020   PROT 7.0 03/22/2021 1020   ALBUMIN 3.4 (L) 03/22/2021 1020   AST 28 03/22/2021 1020   ALT 18 03/22/2021 1020   ALKPHOS 65 03/22/2021 1020   BILITOT 0.6 03/22/2021 1020   GFRNONAA >60 03/22/2021 1020   GFRNONAA 78 03/23/2015 0946   GFRAA >60 09/12/2020 0853   GFRAA >89 03/23/2015 0946    No results found for: SPEP, UPEP  Lab Results  Component Value Date   WBC 4.5 03/22/2021   NEUTROABS 2.5 03/22/2021   HGB 11.1 (L) 03/22/2021   HCT 33.6 (L) 03/22/2021   MCV 75.5 (L) 03/22/2021   PLT 230 03/22/2021      Chemistry      Component Value Date/Time   NA 139 03/22/2021 1020   NA 140 07/04/2017 0952   K 4.0 03/22/2021 1020   CL 103 03/22/2021 1020   CO2 26 03/22/2021 1020   BUN 12 03/22/2021 1020   BUN 10 07/04/2017 0952   CREATININE 0.96 03/22/2021 1020   CREATININE 0.81 03/23/2015 0946      Component Value Date/Time   CALCIUM 9.1 03/22/2021 1020   ALKPHOS 65 03/22/2021 1020   AST 28 03/22/2021 1020   ALT 18 03/22/2021 1020   BILITOT 0.6 03/22/2021 1020       RADIOGRAPHIC STUDIES: I have personally reviewed the radiological images as listed and agreed with the findings in the report. ECHOCARDIOGRAM COMPLETE  Result Date:  03/06/2021    ECHOCARDIOGRAM REPORT   Patient Name:   Tina Stone Date of Exam: 03/06/2021 Medical  Rec #:  [id number]         Height:       64.0 in Accession #:    [id number]        Weight:       187.4 lb Date of Birth:  18-Jul-1950        BSA:          1.903 m Patient Age:    71 years          BP:           137/82 mmHg Patient Gender: F                 HR:           47 bpm. Exam Location:  Outpatient Procedure: 2D Echo, 3D Echo, Cardiac Doppler, Color Doppler and Strain Analysis Indications:    ; I48.91* Unspeicified atrial fibrillation  History:        Patient has no prior history of Echocardiogram examinations.                 Abnormal ECG, Arrythmias:Atrial Fibrillation; Risk                 Factors:Dyslipidemia and Hypertension. Cancer.  Sonographer:    Roseanna Rainbow Referring Phys: [id number] CLINT R F[place]ON  Sonographer Comments: Global longitudinal strain was attempted. IMPRESSIONS  1. Left ventricular ejection fraction, by estimation, is 55 to 60%. The left ventricle has normal function. The left ventricle has no regional wall motion abnormalities. Left ventricular diastolic parameters are consistent with Grade I diastolic dysfunction (impaired relaxation).  2. Right ventricular systolic function is normal. The right ventricular size is normal. There is normal pulmonary artery systolic pressure.  3. The mitral valve is abnormal. Trivial mitral valve regurgitation.  4. The aortic valve is tricuspid. Aortic valve regurgitation is not visualized.  5. Pulmonic valve: Mild leaflet calcification - no stenosis.  6. The inferior vena cava is normal in size with <50% respiratory variability, suggesting right atrial pressure of 8 mmHg. Comparison(s): No prior Echocardiogram. FINDINGS  Left Ventricle: Left ventricular ejection fraction, by estimation, is 55 to 60%. The left ventricle has normal function. The left ventricle has no regional wall motion abnormalities. The left ventricular internal cavity size was normal in  size. There is  no left ventricular hypertrophy. Left ventricular diastolic parameters are consistent with Grade I diastolic dysfunction (impaired relaxation). Indeterminate filling pressures. Right Ventricle: The right ventricular size is normal. No increase in right ventricular wall thickness. Right ventricular systolic function is normal. There is normal pulmonary artery systolic pressure. The tricuspid regurgitant velocity is 2.07 m/s, and  with an assumed right atrial pressure of 8 mmHg, the estimated right ventricular systolic pressure is 27.5 mmHg. Left Atrium: Left atrial size was normal in size. Right Atrium: Right atrial size was normal in size. Pericardium: There is no evidence of pericardial effusion. Mitral Valve: The mitral valve is abnormal. There is mild thickening of the mitral valve leaflet(s). Trivial mitral valve regurgitation. Tricuspid Valve: The tricuspid valve is grossly normal. Tricuspid valve regurgitation is mild. Aortic Valve: The aortic valve is tricuspid. Aortic valve regurgitation is not visualized. Pulmonic Valve: Pulmonic valve: Mild leaflet calcification - no stenosis. The pulmonic valve was thickened with mildly decreased excursion. Pulmonic valve regurgitation is not visualized. Aorta: The aortic root and ascending aorta are structurally normal, with no evidence of dilitation. Venous: The inferior vena cava is normal in size with less than 50% respiratory variability, suggesting right  atrial pressure of 8 mmHg. IAS/Shunts: No atrial level shunt detected by color flow Doppler.  LEFT VENTRICLE PLAX 2D LVIDd:         3.60 cm     Diastology LVIDs:         2.30 cm     LV e' medial:    8.35 cm/s LV PW:         0.90 cm     LV E/e' medial:  8.6 LV IVS:        1.10 cm     LV e' lateral:   7.62 cm/s LVOT diam:     1.80 cm     LV E/e' lateral: 9.4 LV SV:         54 LV SV Index:   28 LVOT Area:     2.54 cm  LV Volumes (MOD) LV vol d, MOD A2C: 66.6 ml LV vol d, MOD A4C: 83.6 ml LV vol s, MOD  A2C: 32.8 ml LV vol s, MOD A4C: 33.1 ml LV SV MOD A2C:     33.8 ml LV SV MOD A4C:     83.6 ml LV SV MOD BP:      42.6 ml RIGHT VENTRICLE RV S prime:     9.97 cm/s TAPSE (M-mode): 2.1 cm LEFT ATRIUM             Index       RIGHT ATRIUM           Index LA diam:        3.10 cm 1.63 cm/m  RA Area:     11.90 cm LA Vol (A2C):   60.2 ml 31.63 ml/m RA Volume:   27.60 ml  14.50 ml/m LA Vol (A4C):   24.2 ml 12.72 ml/m LA Biplane Vol: 42.1 ml 22.12 ml/m  AORTIC VALVE LVOT Vmax:   86.80 cm/s LVOT Vmean:  60.600 cm/s LVOT VTI:    0.212 m  AORTA Ao Root diam: 3.00 cm Ao Asc diam:  3.00 cm MITRAL VALVE               TRICUSPID VALVE MV Area (PHT): 4.49 cm    TR Peak grad:   17.1 mmHg MV Decel Time: 169 msec    TR Vmax:        207.00 cm/s MV E velocity: 71.60 cm/s MV A velocity: 61.70 cm/s  SHUNTS MV E/A ratio:  1.16        Systemic VTI:  0.21 m                            Systemic Diam: 1.80 cm Lyman Bishop MD Electronically signed by Lyman Bishop MD Signature Date/Time: 03/06/2021/11:46:52 AM    Final

## 2021-03-22 NOTE — Patient Instructions (Signed)

## 2021-03-22 NOTE — Assessment & Plan Note (Signed)
Her blood pressure is now well controlled She will continue medical management

## 2021-03-27 ENCOUNTER — Other Ambulatory Visit: Payer: Self-pay

## 2021-03-28 ENCOUNTER — Encounter: Payer: Self-pay | Admitting: Internal Medicine

## 2021-03-28 ENCOUNTER — Ambulatory Visit (INDEPENDENT_AMBULATORY_CARE_PROVIDER_SITE_OTHER): Payer: Medicare Other | Admitting: Internal Medicine

## 2021-03-28 VITALS — BP 130/80 | HR 61 | Temp 98.0°F | Wt 199.1 lb

## 2021-03-28 DIAGNOSIS — E559 Vitamin D deficiency, unspecified: Secondary | ICD-10-CM | POA: Diagnosis not present

## 2021-03-28 DIAGNOSIS — I480 Paroxysmal atrial fibrillation: Secondary | ICD-10-CM | POA: Diagnosis not present

## 2021-03-28 DIAGNOSIS — C541 Malignant neoplasm of endometrium: Secondary | ICD-10-CM

## 2021-03-28 DIAGNOSIS — R739 Hyperglycemia, unspecified: Secondary | ICD-10-CM | POA: Diagnosis not present

## 2021-03-28 DIAGNOSIS — E785 Hyperlipidemia, unspecified: Secondary | ICD-10-CM

## 2021-03-28 DIAGNOSIS — I10 Essential (primary) hypertension: Secondary | ICD-10-CM | POA: Diagnosis not present

## 2021-03-28 DIAGNOSIS — E669 Obesity, unspecified: Secondary | ICD-10-CM

## 2021-03-28 DIAGNOSIS — R7303 Prediabetes: Secondary | ICD-10-CM | POA: Diagnosis not present

## 2021-03-28 LAB — POCT GLUCOSE (DEVICE FOR HOME USE): POC Glucose: 82 mg/dl (ref 70–99)

## 2021-03-28 NOTE — Patient Instructions (Signed)
-  Nice seeing you today!!  -no labs today.  -check blood pressure 2-3 times a week and bring measurements to your next visit.  -Schedule follow up in 6 months for your physical. Please come in fasting that day.

## 2021-03-28 NOTE — Addendum Note (Signed)
Addended by: Westley Hummer B on: 03/28/2021 08:07 AM   Modules accepted: Orders

## 2021-03-28 NOTE — Progress Notes (Signed)
Established Patient Office Visit     This visit occurred during the SARS-CoV-2 public health emergency.  Safety protocols were in place, including screening questions prior to the visit, additional usage of staff PPE, and extensive cleaning of exam room while observing appropriate contact time as indicated for disinfecting solutions.    CC/Reason for Visit: 53-monthfollow-up chronic medical conditions  HPI: Tina Fogartyis a 71y.o. female who is coming in today for the above mentioned reasons. Past Medical History is significant for: Hypertension GERD, hyperlipidemia, endometrial cancer followed by oncology and atrial fibrillation.  She has had no major events since we last spoke.  She tells me that her oncologist has her on a treatment holiday and she is due for scanning in May.  She has some mild bilateral knee pain that she knows is arthritis.  She continues healthy lifestyle changes.  She has not lost much weight.  She has not been checking her blood pressures.   Past Medical/Surgical History: Past Medical History:  Diagnosis Date  . Adenomatous colon polyp   . Allergic rhinitis, seasonal   . Allergy   . Beta thalassemia trait 11/25/2013  . Cholelithiasis   . Class 3 obesity without serious comorbidity with body mass index (BMI) of 40.0 to 44.9 in adult 11/19/2012  . endometrial ca dx'd 08/2009   endometrial   . GERD (gastroesophageal reflux disease)   . HLD (hyperlipidemia)   . Hypercholesterolemia   . Hypertension 03/18/2017   no meds   . Intraductal papilloma of left breast    Patient underwent left needle-localized lumpectomy by Dr. MImogene Burn Tsuei on 09/09/2013; pathology showed intraductal papilloma with no atypia or malignancy identified.  .Marland KitchenPONV (postoperative nausea and vomiting)   . Pre-diabetes    pt denies  . Uterine fibroid     Past Surgical History:  Procedure Laterality Date  . [place]  right  . BREAST EXCISIONAL BIOPSY  Left 2014   benign  . BREAST LUMPECTOMY WITH NEEDLE LOCALIZATION Left 09/09/2013   Procedure: BREAST LUMPECTOMY WITH NEEDLE LOCALIZATION;  Surgeon: MImogene Burn TGeorgette Dover MD;  Location: [facility]  Service: General;  Laterality: Left;  . CHOLECYSTECTOMY    . COLONOSCOPY    . IR IMAGING GUIDED PORT INSERTION  05/18/2020  . POLYPECTOMY    . ROBOTIC ASSISTED LAPAROSCOPIC CHOLECYSTECTOMY  09/09/2019  . ROBOTIC ASSISTED TOTAL HYSTERECTOMY WITH BILATERAL SALPINGO OOPHERECTOMY N/A 10/22/2019   Procedure: XI ROBOTIC ASSISTED TOTAL HYSTERECTOMY WITH BILATERAL SALPINGO OOPHORECTOMY GREATER THAN 250 GRAMS, MINI LAPARTOMY FOR SPECIMEN DELIVERY; PELVIC AND PERI-AORTIC LYMPHADENECTOMY;  Surgeon: REveritt Amber MD;  Location: [facility];  Service: Gynecology;  Laterality: N/A;  . SENTINEL NODE BIOPSY N/A 10/22/2019   Procedure: SENTINEL NODE BIOPSY;  Surgeon: REveritt Amber MD;  Location: [facility];  Service: Gynecology;  Laterality: N/A;    Social History:  reports that she has never smoked. She has never used smokeless tobacco. She reports that she does not drink alcohol and does not use drugs.  Allergies: Allergies  Allergen Reactions  . Benicar Hct [Olmesartan Medoxomil-Hctz] Shortness Of Breath and Palpitations  . Iodinated Diagnostic Agents Hives and Itching    02/02/2021-  pt developed 2 hives and itching even with the 13hr premedication.  Radiology PA came into eval the pt.  Developed itching and hives after injection on 05/10/20; needs 13hr prep in future  . Bactrim [Sulfamethoxazole-Trimethoprim] Other (See Comments)    Abdominal pain, dizziness  . Pravastatin  Other (See Comments)    Lower abdominal pain  . Shellfish Allergy Nausea And Vomiting  . Amlodipine Palpitations    Family History:  Family History  Problem Relation Age of Onset  . Diabetes Mother   . Hypertension Mother   . Colon polyps Mother 51  . Dementia Mother 70  . Diabetes Father   . Congestive Heart Failure Father   . Pancreatic cancer  Paternal Aunt   . Colon cancer Neg Hx   . Breast cancer Neg Hx   . Lung cancer Neg Hx   . Esophageal cancer Neg Hx   . Rectal cancer Neg Hx   . Stomach cancer Neg Hx      Current Outpatient Medications:  .  acetaminophen (TYLENOL) 500 MG tablet, Take 1,000 mg by mouth every 6 (six) hours as needed for moderate pain., Disp: , Rfl:  .  apixaban (ELIQUIS) 5 MG TABS tablet, Take 1 tablet (5 mg total) by mouth 2 (two) times daily., Disp: 60 tablet, Rfl: 3 .  APPLE CIDER VINEGAR PO, Take 5 mLs by mouth daily. , Disp: , Rfl:  .  atenolol (TENORMIN) 25 MG tablet, Take 1 tablet (25 mg total) by mouth daily., Disp: 30 tablet, Rfl: 3 .  Blood Pressure Monitoring (BLOOD PRESSURE KIT) DEVI, 1 Units by Does not apply route every morning., Disp: 1 each, Rfl: 0 .  calcium carbonate (CALCIUM 600) 1500 (600 Ca) MG TABS tablet, Take by mouth daily with breakfast., Disp: , Rfl:  .  Multiple Vitamin (MULTIVITAMIN) capsule, Take 1 capsule by mouth daily., Disp: , Rfl:  .  polyethylene glycol (MIRALAX / GLYCOLAX) 17 g packet, Take 17 g by mouth daily as needed for mild constipation., Disp: , Rfl:  .  predniSONE (DELTASONE) 50 MG tablet, Take 1 pill at 13 hours, 7 hours and 1 hour before CT scan, Disp: 3 tablet, Rfl: 0 .  ALPRAZolam (XANAX) 0.25 MG tablet, Take 1 tablet (0.25 mg total) by mouth 2 (two) times daily as needed for anxiety. (Patient not taking: Reported on 03/28/2021), Disp: 30 tablet, Rfl: 0 .  loratadine (CLARITIN) 10 MG tablet, Take 10 mg by mouth daily as needed for allergies. (Patient not taking: Reported on 03/28/2021), Disp: , Rfl:  .  ondansetron (ZOFRAN) 8 MG tablet, Take 1 tablet (8 mg total) by mouth every 8 (eight) hours as needed. Start on the third day after chemotherapy. (Patient not taking: Reported on 03/28/2021), Disp: 30 tablet, Rfl: 1 .  prochlorperazine (COMPAZINE) 10 MG tablet, Take 1 tablet (10 mg total) by mouth every 6 (six) hours as needed (Nausea or vomiting). (Patient not taking:  Reported on 03/28/2021), Disp: 30 tablet, Rfl: 1  Review of Systems:  Constitutional: Denies fever, chills, diaphoresis, appetite change and fatigue.  HEENT: Denies photophobia, eye pain, redness, hearing loss, ear pain, congestion, sore throat, rhinorrhea, sneezing, mouth sores, trouble swallowing, neck pain, neck stiffness and tinnitus.   Respiratory: Denies SOB, DOE, cough, chest tightness,  and wheezing.   Cardiovascular: Denies chest pain, palpitations and leg swelling.  Gastrointestinal: Denies nausea, vomiting, abdominal pain, diarrhea, constipation, blood in stool and abdominal distention.  Genitourinary: Denies dysuria, urgency, frequency, hematuria, flank pain and difficulty urinating.  Endocrine: Denies: hot or cold intolerance, sweats, changes in hair or nails, polyuria, polydipsia. Musculoskeletal: Denies myalgias, back pain, joint swelling and gait problem.  Skin: Denies pallor, rash and wound.  Neurological: Denies dizziness, seizures, syncope, weakness, light-headedness, numbness and headaches.  Hematological: Denies adenopathy. Easy bruising, personal  or family bleeding history  Psychiatric/Behavioral: Denies suicidal ideation, mood changes, confusion, nervousness, sleep disturbance and agitation    Physical Exam: Vitals:   03/28/21 0720  BP: 130/80  Pulse: 61  Temp: 98 F (36.7 C)  TempSrc: Oral  SpO2: 99%  Weight: 199 lb 1.6 oz (90.3 kg)    Body mass index is 34.18 kg/m.   Constitutional: NAD, calm, comfortable Eyes: PERRL, lids and conjunctivae normal ENMT: Mucous membranes are moist.  Respiratory: clear to auscultation bilaterally, no wheezing, no crackles. Normal respiratory effort. No accessory muscle use.  Cardiovascular: Regular rate and rhythm, no murmurs / rubs / gallops. No extremity edema. Abdomen: no tenderness, no masses palpated. No hepatosplenomegaly. Bowel sounds positive.  Neurologic: Grossly intact and nonfocal Psychiatric: Normal judgment  and insight. Alert and oriented x 3. Normal mood.    Impression and Plan:  Paroxysmal atrial fibrillation ([place]) -Followed by cardiology. -Rate controlled on atenolol, she is also on Eliquis for stroke prevention.  Essential hypertension -Fair control.  Vitamin D deficiency -Check levels when she returns for CPE.  Hyperlipidemia, unspecified hyperlipidemia type -Last LDL was 172 in May 2021, she is not on medications per her choice.  Class II obesity -Discussed healthy lifestyle, including increased physical activity and better food choices to promote weight loss.  Endometrial cancer ([place]) -Followed by oncology, currently on treatment holiday.   Patient Instructions  -Nice seeing you today!!  -no labs today.  -check blood pressure 2-3 times a week and bring measurements to your next visit.  -Schedule follow up in 6 months for your physical. Please come in fasting that day.     Lelon Frohlich, MD [facility]

## 2021-04-06 ENCOUNTER — Other Ambulatory Visit (HOSPITAL_COMMUNITY): Payer: Self-pay

## 2021-04-13 ENCOUNTER — Emergency Department (HOSPITAL_COMMUNITY)
Admission: EM | Admit: 2021-04-13 | Discharge: 2021-04-13 | Disposition: A | Discharge status: Home / Self Care | Payer: Medicare Other | Attending: Emergency Medicine | Admitting: Emergency Medicine

## 2021-04-13 DIAGNOSIS — R7303 Prediabetes: Secondary | ICD-10-CM | POA: Diagnosis not present

## 2021-04-13 DIAGNOSIS — Z79899 Other long term (current) drug therapy: Secondary | ICD-10-CM | POA: Insufficient documentation

## 2021-04-13 DIAGNOSIS — Z8505 Personal history of malignant neoplasm of liver: Secondary | ICD-10-CM | POA: Diagnosis not present

## 2021-04-13 DIAGNOSIS — R001 Bradycardia, unspecified: Secondary | ICD-10-CM | POA: Diagnosis not present

## 2021-04-13 DIAGNOSIS — Z7901 Long term (current) use of anticoagulants: Secondary | ICD-10-CM | POA: Diagnosis not present

## 2021-04-13 DIAGNOSIS — Z8542 Personal history of malignant neoplasm of other parts of uterus: Secondary | ICD-10-CM | POA: Diagnosis not present

## 2021-04-13 DIAGNOSIS — R42 Dizziness and giddiness: Secondary | ICD-10-CM

## 2021-04-13 DIAGNOSIS — I10 Essential (primary) hypertension: Secondary | ICD-10-CM | POA: Diagnosis not present

## 2021-04-13 LAB — COMPREHENSIVE METABOLIC PANEL
ALT: 32 U/L (ref 0–44)
AST: 42 U/L — ABNORMAL HIGH (ref 15–41)
Albumin: 3.8 g/dL (ref 3.5–5.0)
Alkaline Phosphatase: 59 U/L (ref 38–126)
Anion gap: 9 (ref 5–15)
BUN: 11 mg/dL (ref 8–23)
CO2: 27 mmol/L (ref 22–32)
Calcium: 9.5 mg/dL (ref 8.9–10.3)
Chloride: 98 mmol/L (ref 98–111)
Creatinine, Ser: 1.21 mg/dL — ABNORMAL HIGH (ref 0.44–1.00)
GFR, Estimated: 49 mL/min — ABNORMAL LOW (ref >60)
Glucose, Bld: 97 mg/dL (ref 70–99)
Potassium: 3.6 mmol/L (ref 3.5–5.1)
Sodium: 134 mmol/L — ABNORMAL LOW (ref 135–145)
Total Bilirubin: 0.6 mg/dL (ref 0.3–1.2)
Total Protein: 7 g/dL (ref 6.5–8.1)

## 2021-04-13 LAB — CBC WITH DIFFERENTIAL/PLATELET
Abs Immature Granulocytes: 0.02 10*3/uL (ref 0.00–0.07)
Basophils Absolute: 0 10*3/uL (ref 0.0–0.1)
Basophils Relative: 1 %
Eosinophils Absolute: 0.1 10*3/uL (ref 0.0–0.5)
Eosinophils Relative: 2 %
HCT: 35.6 % — ABNORMAL LOW (ref 36.0–46.0)
Hemoglobin: 11.6 g/dL — ABNORMAL LOW (ref 12.0–15.0)
Immature Granulocytes: 1 %
Lymphocytes Relative: 37 %
Lymphs Abs: 1.6 10*3/uL (ref 0.7–4.0)
MCH: 25.9 pg — ABNORMAL LOW (ref 26.0–34.0)
MCHC: 32.6 g/dL (ref 30.0–36.0)
MCV: 79.5 fL — ABNORMAL LOW (ref 80.0–100.0)
Monocytes Absolute: 0.3 10*3/uL (ref 0.1–1.0)
Monocytes Relative: 6 %
Neutro Abs: 2.4 10*3/uL (ref 1.7–7.7)
Neutrophils Relative %: 53 %
Platelets: 203 10*3/uL (ref 150–400)
RBC: 4.48 MIL/uL (ref 3.87–5.11)
RDW: 19.6 % — ABNORMAL HIGH (ref 11.5–15.5)
WBC: 4.4 10*3/uL (ref 4.0–10.5)
nRBC: 0 % (ref 0.0–0.2)

## 2021-04-13 LAB — TROPONIN I (HIGH SENSITIVITY): Troponin I (High Sensitivity): 8 ng/L (ref <18)

## 2021-04-13 MED ORDER — APIXABAN 5 MG PO TABS
5.0000 mg | ORAL_TABLET | Freq: Once | ORAL | Status: AC
  Administered 2021-04-13: 5 mg via ORAL
  Filled 2021-04-13: qty 1

## 2021-04-13 NOTE — ED Notes (Signed)
Pt ambulated to bathroom independently

## 2021-04-13 NOTE — Discharge Instructions (Signed)
You were seen today for feelings of lightheadedness.  Your work-up today is notable for a slight elevation of your creatinine.  This is close to the levels you have been in the past but is important he follows up with your primary care provider and a repeat lab draw in the future.  Otherwise your EKG is without abnormality in your troponin evaluation for any heart strain is otherwise within normal limits.  At this point you are safe to continue follow-up with your primary care provider.  Please return with any changes in your symptoms, fevers or chills, nausea or vomiting, syncope or shortness of breath.  Thank for the opportunity to participate in your care.

## 2021-04-13 NOTE — ED Provider Notes (Signed)
[facility] EMERGENCY DEPARTMENT Provider Note   CSN: [id number] Arrival date & time: 04/13/21  1412     History Chief Complaint  Patient presents with  . Dizziness    Tina Stone is a 71 y.o. female.  HPI Patient presents with a chief complaint of lightheadedness at work today.  Patient states that she has a history of hypertension, atrial fibrillation, uterine cancer, hypercholesterolemia, hypertension and states that while she was or today she began feeling lightheaded.  Things were going black around her she sat down and completely resolved.  Patient had a protracted wait in the emergency department and is asymptomatic at this time.  She denies fevers or chills, nausea or vomiting, syncope or shortness of breath.  Patient furthermore denies chest pain.  Patient ambulatory and tolerating p.o. intake at this time. Patient states that now that she is completely asymptomatic, she is requesting discharge to follow-up with her primary care provider.  Past Medical History:  Diagnosis Date  . Adenomatous colon polyp   . Allergic rhinitis, seasonal   . Allergy   . Beta thalassemia trait 11/25/2013  . Cholelithiasis   . Class 3 obesity without serious comorbidity with body mass index (BMI) of 40.0 to 44.9 in adult 11/19/2012  . endometrial ca dx'd 08/2009   endometrial   . GERD (gastroesophageal reflux disease)   . HLD (hyperlipidemia)   . Hypercholesterolemia   . Hypertension 03/18/2017   no meds   . Intraductal papilloma of left breast    Patient underwent left needle-localized lumpectomy by Dr. Imogene Burn. Tsuei on 09/09/2013; pathology showed intraductal papilloma with no atypia or malignancy identified.  Marland Kitchen PONV (postoperative nausea and vomiting)   . Pre-diabetes    pt denies  . Uterine fibroid     Patient Active Problem List   Diagnosis Date Noted  . Paroxysmal atrial fibrillation ([place]) 02/03/2021  . Secondary hypercoagulable state ([place]) 02/03/2021   . Fall 11/29/2020  . Mild anxiety 11/28/2020  . Essential hypertension 11/16/2020  . Cancer associated pain 11/08/2020  . Vitamin D deficiency 10/27/2020  . Pancytopenia, acquired ([place]) 09/12/2020  . Skin rash 08/17/2020  . Metastasis to liver ([place]) 06/14/2020  . Bone pain 05/27/2020  . Goals of care, counseling/discussion 05/16/2020  . Endometrial cancer ([place]) 10/22/2019  . Postmenopausal bleeding 09/02/2019  . Biliary colic 40/98/1191  . Breast pain, left 05/20/2019  . Trapezius muscle spasm 05/11/2019  . Elevated random blood glucose level 02/13/2019  . Epigastric pain 01/29/2019  . Prediabetes 03/18/2017  . Hypertension 03/18/2017  . Beta thalassemia trait 11/25/2013  . Class II obesity 11/19/2012  . Women's annual routine gynecological examination 07/12/2011  . Gastroesophageal reflux disease 01/28/2007  . Hyperlipidemia 10/10/2006    Past Surgical History:  Procedure Laterality Date  . [place]   right  . BREAST EXCISIONAL BIOPSY Left 2014   benign  . BREAST LUMPECTOMY WITH NEEDLE LOCALIZATION Left 09/09/2013   Procedure: BREAST LUMPECTOMY WITH NEEDLE LOCALIZATION;  Surgeon: Imogene Burn. Georgette Dover, MD;  Location: [facility];  Service: General;  Laterality: Left;  . CHOLECYSTECTOMY    . COLONOSCOPY    . IR IMAGING GUIDED PORT INSERTION  05/18/2020  . POLYPECTOMY    . ROBOTIC ASSISTED LAPAROSCOPIC CHOLECYSTECTOMY  09/09/2019  . ROBOTIC ASSISTED TOTAL HYSTERECTOMY WITH BILATERAL SALPINGO OOPHERECTOMY N/A 10/22/2019   Procedure: XI ROBOTIC ASSISTED TOTAL HYSTERECTOMY WITH BILATERAL SALPINGO OOPHORECTOMY GREATER THAN 250 GRAMS, MINI LAPARTOMY FOR SPECIMEN DELIVERY; PELVIC AND PERI-AORTIC LYMPHADENECTOMY;  Surgeon: Everitt Amber, MD;  Location: [facility];  Service: Gynecology;  Laterality: N/A;  . SENTINEL NODE BIOPSY N/A 10/22/2019   Procedure: SENTINEL NODE BIOPSY;  Surgeon: Everitt Amber, MD;  Location: [facility];  Service: Gynecology;  Laterality: N/A;     OB History     Gravida  1   Para  0   Term  0   Preterm  0   AB  1   Living  0     SAB  0   IAB  1   Ectopic  0   Multiple  0   Live Births  0           Family History  Problem Relation Age of Onset  . Diabetes Mother   . Hypertension Mother   . Colon polyps Mother 93  . Dementia Mother 8  . Diabetes Father   . Congestive Heart Failure Father   . Pancreatic cancer Paternal Aunt   . Colon cancer Neg Hx   . Breast cancer Neg Hx   . Lung cancer Neg Hx   . Esophageal cancer Neg Hx   . Rectal cancer Neg Hx   . Stomach cancer Neg Hx     Social History   Tobacco Use  . Smoking status: Never Smoker  . Smokeless tobacco: Never Used  . Tobacco comment: few puffs but not a true smoker quit many yrs ago  Vaping Use  . Vaping Use: Never used  Substance Use Topics  . Alcohol use: Never  . Drug use: Never    Home Medications Prior to Admission medications   Medication Sig Start Date End Date Taking? Authorizing Provider  acetaminophen (TYLENOL) 500 MG tablet Take 1,000 mg by mouth every 6 (six) hours as needed for moderate pain.    [provider]  ALPRAZolam (XANAX) 0.25 MG tablet TAKE 1 TABLET BY MOUTH 2 TIMES DAILY AS NEEDED FOR ANXIETY. Patient not taking: Reported on 03/28/2021 11/28/20 05/27/21  [name], MD  apixaban (ELIQUIS) 5 MG TABS tablet Take 1 tablet (5 mg total) by mouth 2 (two) times daily. 02/27/21 03/29/21  Fenton, Clint R, PA  APPLE CIDER VINEGAR PO Take 5 mLs by mouth daily.     [provider]  atenolol (TENORMIN) 25 MG tablet Take 1 tablet (25 mg total) by mouth daily. 02/27/21   Fenton, Clint R, PA  Blood Pressure Monitoring (BLOOD PRESSURE KIT) DEVI 1 Units by Does not apply route every morning. 07/01/20   Raylene Everts, MD  calcium carbonate (CALCIUM 600) 1500 (600 Ca) MG TABS tablet Take by mouth daily with breakfast.    [provider]  loratadine (CLARITIN) 10 MG tablet Take 10 mg by mouth daily as needed for  allergies. Patient not taking: Reported on 03/28/2021    [provider]  Multiple Vitamin (MULTIVITAMIN) capsule Take 1 capsule by mouth daily.    [provider]  ondansetron (ZOFRAN) 8 MG tablet Take 1 tablet (8 mg total) by mouth every 8 (eight) hours as needed. Start on the third day after chemotherapy. Patient not taking: Reported on 03/28/2021 05/16/20   [name], MD  polyethylene glycol (MIRALAX / GLYCOLAX) 17 g packet Take 17 g by mouth daily as needed for mild constipation.    [provider]  predniSONE (DELTASONE) 50 MG tablet Take 1 pill at 13 hours, 7 hours and 1 hour before CT scan 03/22/21   [name], MD  prochlorperazine (COMPAZINE) 10 MG tablet Take  1 tablet (10 mg total) by mouth every 6 (six) hours as needed (Nausea or vomiting). Patient not taking: Reported on 03/28/2021 05/16/20   [name], MD  Vitamin D, Ergocalciferol, (DRISDOL) 1.25 MG (50000 UNIT) CAPS capsule TAKE 1 CAPSULE (50,000 UNITS TOTAL) BY MOUTH EVERY 7 (SEVEN) DAYS FOR 12 DOSES. 10/27/20 10/27/21  Isaac Bliss, Rayford Halsted, MD  atorvastatin (LIPITOR) 20 MG tablet Take 1 tablet (20 mg total) by mouth daily. 05/03/20 07/01/20  Isaac Bliss, Rayford Halsted, MD  chlorthalidone (HYGROTON) 25 MG tablet Take 0.5 tablets (12.5 mg total) by mouth daily. 01/05/21 02/03/21  Isaac Bliss, Rayford Halsted, MD    Allergies    Benicar hct [olmesartan medoxomil-hctz], Iodinated diagnostic agents, Bactrim [sulfamethoxazole-trimethoprim], Pravastatin, Shellfish allergy, and Amlodipine  Review of Systems   Review of Systems  Constitutional: Negative for chills and fever.  HENT: Negative for ear pain and sore throat.   Eyes: Negative for pain and visual disturbance.  Respiratory: Negative for cough and shortness of breath.   Cardiovascular: Negative for chest pain and palpitations.  Gastrointestinal: Negative for abdominal pain and vomiting.  Genitourinary: Negative for dysuria and hematuria.   Musculoskeletal: Negative for arthralgias and back pain.  Skin: Negative for color change and rash.  Neurological: Positive for light-headedness. Negative for seizures and syncope.  All other systems reviewed and are negative.   Physical Exam Updated Vital Signs BP (!) 163/81   Pulse (!) 51   Temp 98.3 F (36.8 C)   Resp 16   SpO2 99%   Physical Exam Vitals and nursing note reviewed.  Constitutional:      General: She is not in acute distress.    Appearance: She is well-developed.  HENT:     Head: Normocephalic and atraumatic.  Eyes:     Conjunctiva/sclera: Conjunctivae normal.  Cardiovascular:     Rate and Rhythm: Normal rate and regular rhythm.     Heart sounds: No murmur heard.   Pulmonary:     Effort: Pulmonary effort is normal. No respiratory distress.     Breath sounds: Normal breath sounds.  Abdominal:     General: There is no distension.     Palpations: Abdomen is soft.     Tenderness: There is no abdominal tenderness. There is no right CVA tenderness or left CVA tenderness.  Musculoskeletal:        General: No swelling or tenderness. Normal range of motion.     Cervical back: Neck supple.  Skin:    General: Skin is warm and dry.  Neurological:     General: No focal deficit present.     Mental Status: She is alert and oriented to person, place, and time. Mental status is at baseline.     Cranial Nerves: No cranial nerve deficit.     ED Results / Procedures / Treatments   Labs (all labs ordered are listed, but only abnormal results are displayed) Labs Reviewed  CBC WITH DIFFERENTIAL/PLATELET - Abnormal; Notable for the following components:      Result Value   Hemoglobin 11.6 (*)    HCT 35.6 (*)    MCV 79.5 (*)    MCH 25.9 (*)    RDW 19.6 (*)    All other components within normal limits  COMPREHENSIVE METABOLIC PANEL - Abnormal; Notable for the following components:   Sodium 134 (*)    Creatinine, Ser 1.21 (*)    AST 42 (*)    GFR, Estimated 49  (*)    All other components  within normal limits  URINALYSIS, ROUTINE W REFLEX MICROSCOPIC  TROPONIN I (HIGH SENSITIVITY)    EKG EKG Interpretation  Date/Time:  Thursday April 13 2021 14:34:05 EDT Ventricular Rate:  57 PR Interval:  170 QRS Duration: 70 QT Interval:  406 QTC Calculation: 395 R Axis:   38 Text Interpretation: Sinus bradycardia Cannot rule out Anterior infarct , age undetermined Abnormal ECG Confirmed by Nanda Quinton [phone]) on 04/13/2021 7:58:18 PM   Radiology No results found.  Procedures Procedures   Medications Ordered in ED Medications  apixaban (ELIQUIS) tablet 5 mg (5 mg Oral Given 04/13/21 2025)    ED Course  I have reviewed the triage vital signs and the nursing notes.  Pertinent labs & imaging results that were available during my care of the patient were reviewed by me and considered in my medical decision making (see chart for details).    MDM Rules/Calculators/A&P                           Medical Decision Making:  Tina Stone is a 71 y.o. female with an extensive medical history, who presented to the ED today with episode of lightheadedness earlier today.  Patient states that this was in the presence of elevated blood pressure readings.  She is being evaluated by her primary care provider for elevated blood pressure and is currently on atenolol and Eliquis for atrial fibrillation..   On my initial exam, the pt was completely asymptomatic and in no acute distress this time.  Patient continues to be hypertensive.   Reviewed and confirmed nursing documentation for past medical history, family history, social history.  Patient history of present illness and physical exam findings are most consistent with idiopathic lightheadedness and presyncope at this time.  EKG without cardiogenic etiology of lightheadedness.  Patient's blood pressure readings without evidence of hypertensive emergency at this time.  Additionally, proceeded to collect  troponin as well as EKG without abnormality.  Given the duration of symptoms and continued resolution, no indication for delta troponin or further intervention and management at this time. Informed patient of her elevated creatinine compared to prior.  Patient stable for ambulatory follow-up and repeat creatinine.  Disposition: Based on the above findings, I believe patient is stable for discharge.   Patient/family educated about specific return precautions for given chief complaint and symptoms.  Patient/family educated about follow-up with PCP.  Patient/family expressed understanding of return precautions and need for follow-up. Patient spoken to regarding all imaging and laboratory results and appropriate follow up for these results. All education provided in verbal form with additional information in written form. Time was allowed for answering of patient questions. Patient discharged.   Emergency Department Medication Summary: Medications  apixaban (ELIQUIS) tablet 5 mg (5 mg Oral Given 04/13/21 2025)        Final Clinical Impression(s) / ED Diagnoses Final diagnoses:  Dizziness    Rx / DC Orders ED Discharge Orders    None       Tretha Sciara, MD 04/13/21 2322    Margette Fast, MD 04/16/21 [phone]

## 2021-04-13 NOTE — ED Notes (Signed)
Pt attempted to provide urine sample, unable to at this time

## 2021-04-13 NOTE — ED Triage Notes (Signed)
Emergency Medicine Provider Triage Evaluation Note  Tina Stone , a 71 y.o. female  was evaluated in triage.  Pt complains of dizziness, hypotension.  Review of Systems  Positive: Light headed Negative: CP, SOB, headache, facial drooping, slurring, fevers  Physical Exam  BP (!) 150/105 (BP Location: Right Arm)   Pulse 61   Temp 97.7 F (36.5 C) (Oral)   Resp 14   SpO2 100%  Gen:   Awake, no distress   HEENT:  Atraumatic  Resp:  Normal effort  Cardiac:  Normal rate  Abd:   Nondistended, nontender  MSK:   Moves extremities without difficulty  Neuro:  Speech clear   Medical Decision Making  Medically screening exam initiated at 2:39 PM.  Appropriate orders placed.  Tina Stone was informed that the remainder of the evaluation will be completed by another provider, this initial triage assessment does not replace that evaluation, and the importance of remaining in the ED until their evaluation is complete.  Clinical Impression  71 year old female with 1 day of lightheadedness.  No chest pain, shortness of breath.  No facial droop or word slurring.  She initially thought that her blood pressure might be low, though she is mildly hypertensive here.  Otherwise reassuring.  Will do basic labs, EKG.  Stable for further evaluation   Garald Balding, PA-C 04/13/21 1442

## 2021-04-13 NOTE — ED Triage Notes (Signed)
Pt c/o feeling lightheaded while at work  No syncopal episode, denies n/v  Lasting about an hour  Pt denies changes to HTN or missing any doses

## 2021-04-18 IMAGING — CR DG CHEST 2V
2 series · 2 of 2 positions shown · non-contrast
Comparison: November 25, 2020

CLINICAL DATA: Shortness of breath.

EXAM:
CHEST - 2 VIEW

[w chest pa]
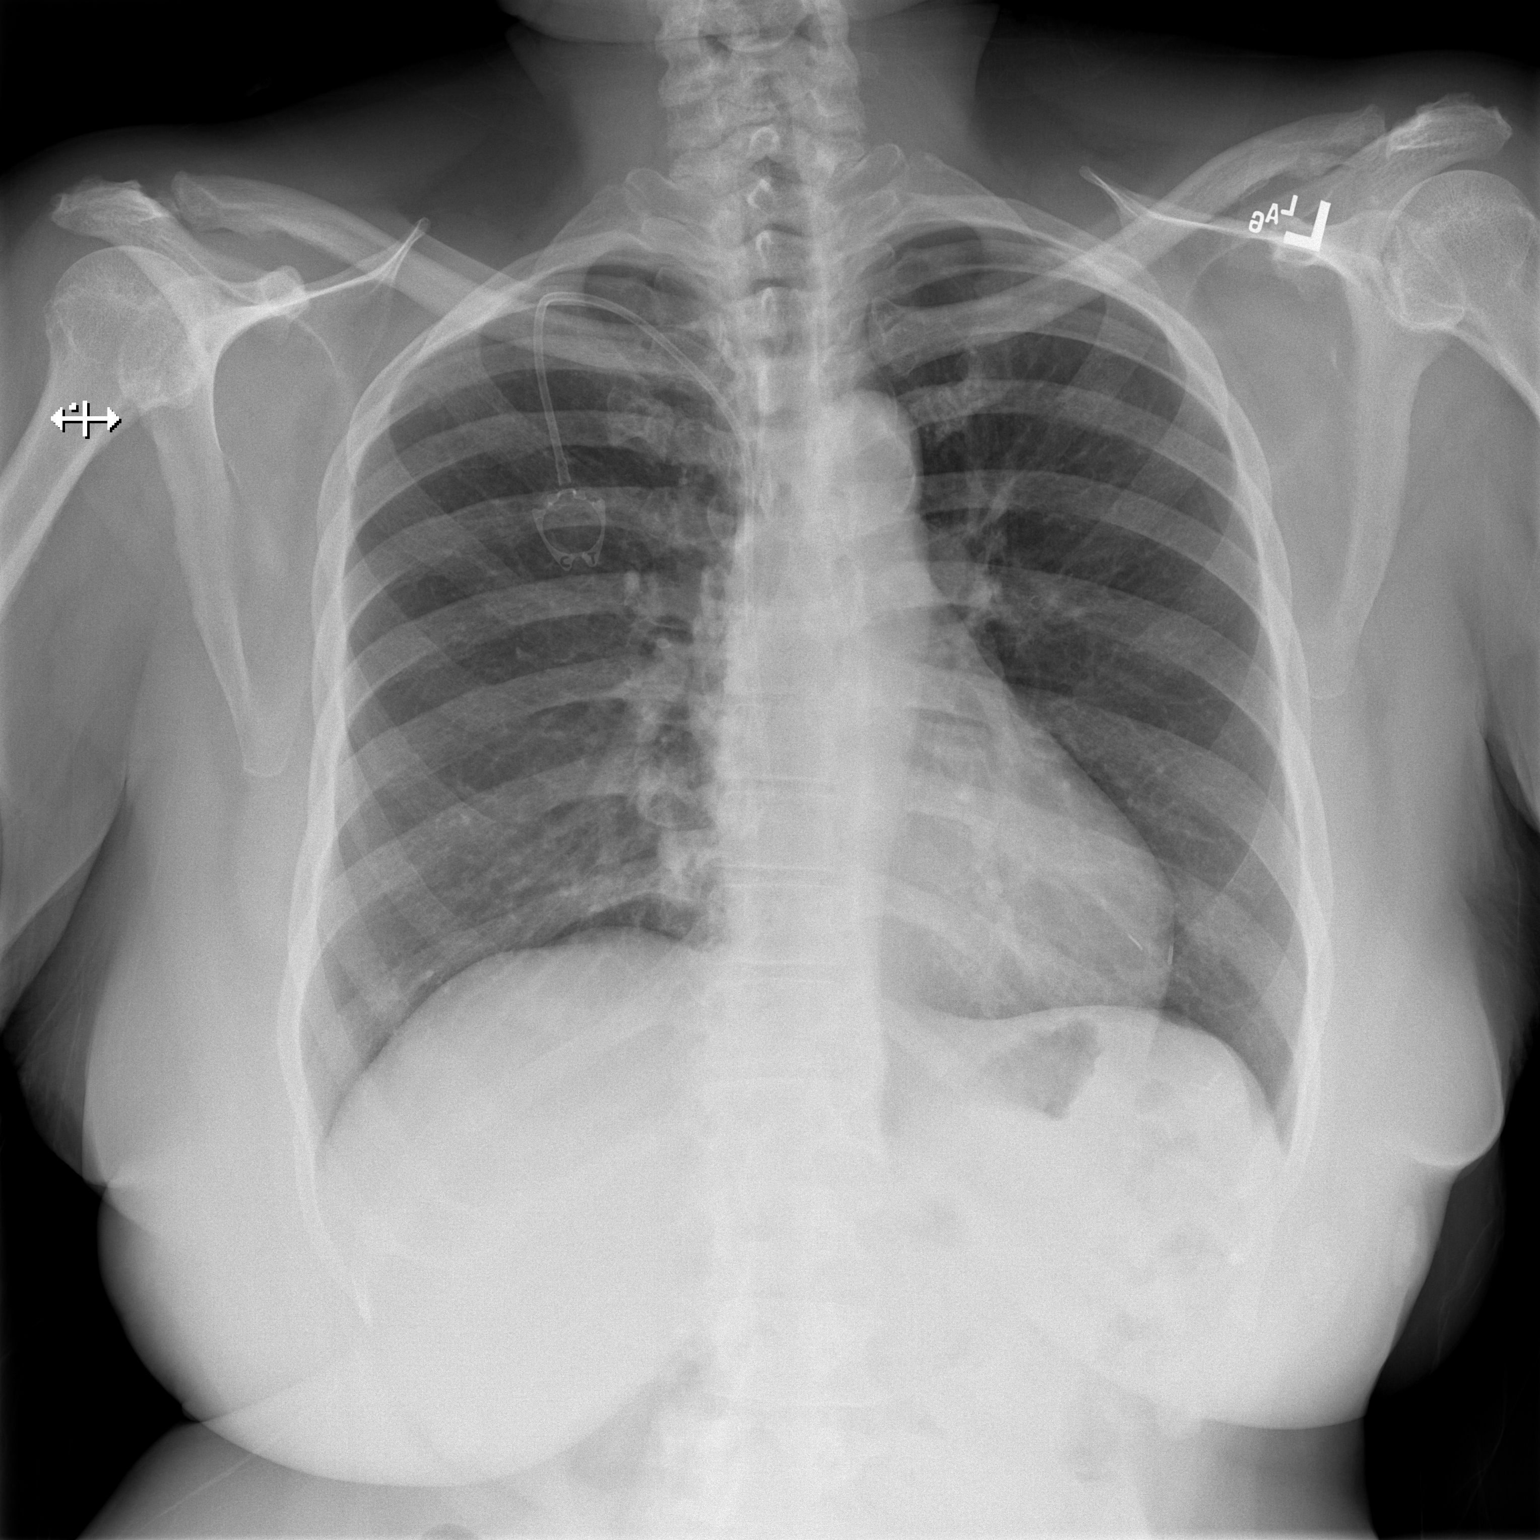

[w chest lat]
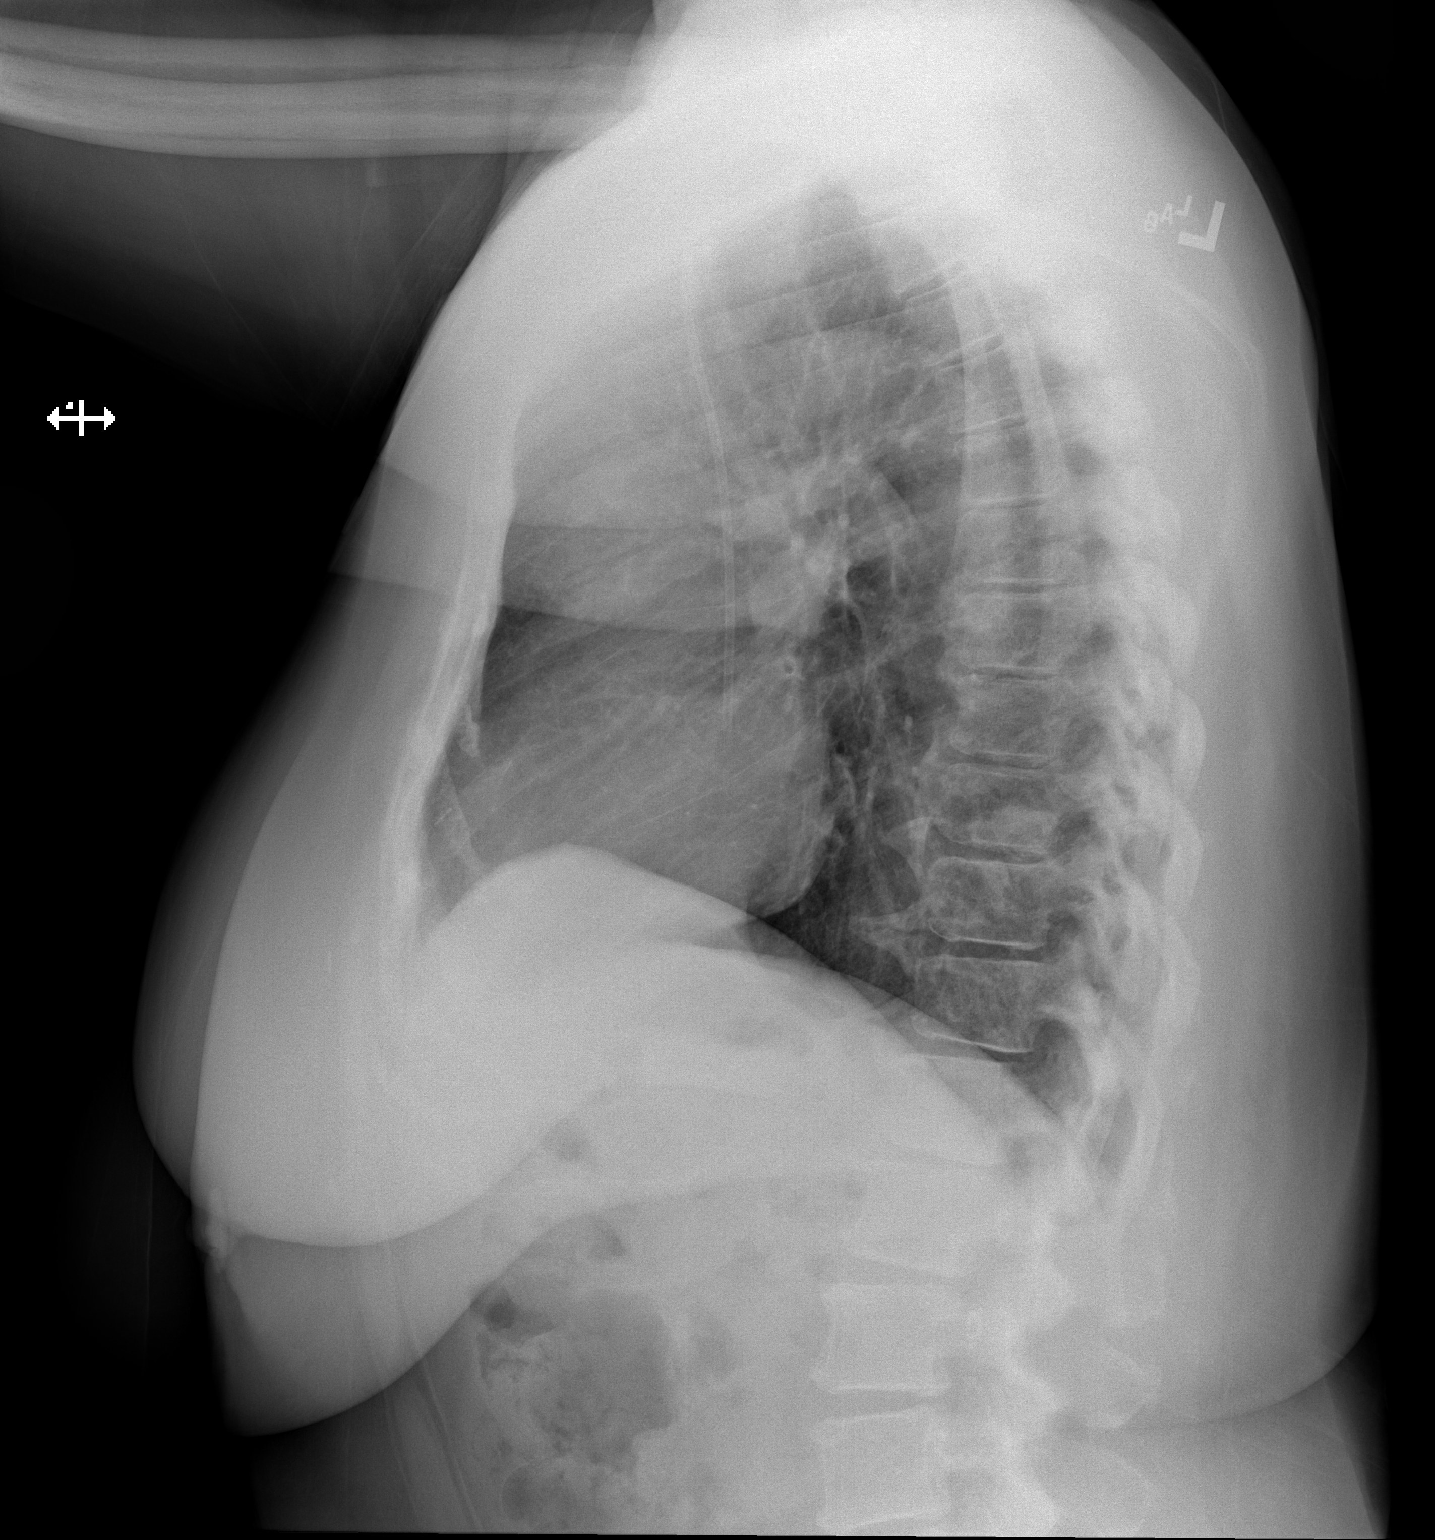

[2 of 2 positions shown; findings below may reference images not displayed]

FINDINGS: There is stable right-sided venous Port-A-Cath positioning. The
heart size and mediastinal contours are within normal limits. Both
lungs are clear. Surgical clips are again seen overlying the left
lung base. The visualized skeletal structures are unremarkable.
IMPRESSION: No active cardiopulmonary disease.

## 2021-04-27 ENCOUNTER — Other Ambulatory Visit: Payer: Self-pay

## 2021-04-27 ENCOUNTER — Encounter (HOSPITAL_COMMUNITY): Payer: Self-pay | Admitting: Physician Assistant

## 2021-04-27 ENCOUNTER — Ambulatory Visit (HOSPITAL_COMMUNITY)
Admission: RE | Admit: 2021-04-27 | Discharge: 2021-04-27 | Disposition: A | Discharge status: Home / Self Care | Payer: Medicare Other | Source: Ambulatory Visit | Attending: Physician Assistant | Admitting: Physician Assistant

## 2021-04-27 VITALS — BP 134/90 | HR 55 | Ht 64.0 in | Wt 201.6 lb

## 2021-04-27 DIAGNOSIS — D6869 Other thrombophilia: Secondary | ICD-10-CM | POA: Diagnosis not present

## 2021-04-27 DIAGNOSIS — I480 Paroxysmal atrial fibrillation: Secondary | ICD-10-CM | POA: Diagnosis not present

## 2021-04-27 DIAGNOSIS — E669 Obesity, unspecified: Secondary | ICD-10-CM | POA: Diagnosis not present

## 2021-04-27 DIAGNOSIS — Z6834 Body mass index (BMI) 34.0-34.9, adult: Secondary | ICD-10-CM | POA: Insufficient documentation

## 2021-04-27 DIAGNOSIS — Z7901 Long term (current) use of anticoagulants: Secondary | ICD-10-CM | POA: Diagnosis not present

## 2021-04-27 DIAGNOSIS — I10 Essential (primary) hypertension: Secondary | ICD-10-CM | POA: Diagnosis not present

## 2021-04-27 DIAGNOSIS — Z8542 Personal history of malignant neoplasm of other parts of uterus: Secondary | ICD-10-CM | POA: Diagnosis not present

## 2021-04-27 DIAGNOSIS — R531 Weakness: Secondary | ICD-10-CM | POA: Diagnosis not present

## 2021-04-27 DIAGNOSIS — Z79899 Other long term (current) drug therapy: Secondary | ICD-10-CM | POA: Insufficient documentation

## 2021-04-27 MED ORDER — ATENOLOL 25 MG PO TABS: 25.0000 mg | ORAL_TABLET | Freq: Every day | ORAL | 3 refills | Status: DC

## 2021-04-27 NOTE — Progress Notes (Signed)
Primary Care Physician: Isaac Bliss, Rayford Halsted, MD Primary Cardiologist: none Primary Electrophysiologist: none Referring Physician: [facility]   Tina Stone is a 71 y.o. female with a history of endometrial cancer followed by Dr Alvy Bimler, HTN, and atrial fibrillation who presents for follow up in the [facility]. The patient was initially diagnosed with atrial fibrillation 01/28/21 after presenting with symptoms of palpitations. EMS reports the patient was initially in SVT with a fast heart rate close to 200.  Patient was given adenosine multiple times and was noted to be in atrial fibrillation.  Patient was started on Cardizem and she converted to sinus tachycardia. Patient is on Eliquis for a CHADS2VASC score of 3. She returned to the ED 01/30/21 with blood on tissue after BM. Negative hemoccult in ED. Her Eliquis was held. She denies any snoring or alcohol use.   On follow up today, patient reports that she has had intermittent weakness and lightheadedness. She reports that this occurs after exercise and resolves with eating a snack and getting something to drink. She denies any bleeding issues on anticoagulation.   Today, she denies symptoms of palpitations, chest pain, shortness of breath, orthopnea, PND, lower extremity edema, presyncope, syncope, snoring, daytime somnolence, bleeding, or neurologic sequela. The patient is tolerating medications without difficulties and is otherwise without complaint today.    Atrial Fibrillation Risk Factors:  she does not have symptoms or diagnosis of sleep apnea. she does not have a history of rheumatic fever. she does not have a history of alcohol use. The patient does not have a history of early familial atrial fibrillation or other arrhythmias.  she has a BMI of Body mass index is 34.6 kg/m.Marland Kitchen Filed Weights   04/27/21 1122  Weight: 91.4 kg    Family History  Problem Relation Age of Onset  . Diabetes  Mother   . Hypertension Mother   . Colon polyps Mother 83  . Dementia Mother 31  . Diabetes Father   . Congestive Heart Failure Father   . Pancreatic cancer Paternal Aunt   . Colon cancer Neg Hx   . Breast cancer Neg Hx   . Lung cancer Neg Hx   . Esophageal cancer Neg Hx   . Rectal cancer Neg Hx   . Stomach cancer Neg Hx      Atrial Fibrillation Management history:  Previous antiarrhythmic drugs: none Previous cardioversions: none Previous ablations: none CHADS2VASC score: 3 Anticoagulation history: Eliquis   Past Medical History:  Diagnosis Date  . Adenomatous colon polyp   . Allergic rhinitis, seasonal   . Allergy   . Beta thalassemia trait 11/25/2013  . Cholelithiasis   . Class 3 obesity without serious comorbidity with body mass index (BMI) of 40.0 to 44.9 in adult 11/19/2012  . endometrial ca dx'd 08/2009   endometrial   . GERD (gastroesophageal reflux disease)   . HLD (hyperlipidemia)   . Hypercholesterolemia   . Hypertension 03/18/2017   no meds   . Intraductal papilloma of left breast    Patient underwent left needle-localized lumpectomy by Dr. Imogene Burn. Tsuei on 09/09/2013; pathology showed intraductal papilloma with no atypia or malignancy identified.  Marland Kitchen PONV (postoperative nausea and vomiting)   . Pre-diabetes    pt denies  . Uterine fibroid    Past Surgical History:  Procedure Laterality Date  . [place]   right  . BREAST EXCISIONAL BIOPSY Left 2014   benign  . BREAST LUMPECTOMY  WITH NEEDLE LOCALIZATION Left 09/09/2013   Procedure: BREAST LUMPECTOMY WITH NEEDLE LOCALIZATION;  Surgeon: Imogene Burn. Georgette Dover, MD;  Location: [place];  Service: General;  Laterality: Left;  . CHOLECYSTECTOMY    . COLONOSCOPY    . IR IMAGING GUIDED PORT INSERTION  05/18/2020  . POLYPECTOMY    . ROBOTIC ASSISTED LAPAROSCOPIC CHOLECYSTECTOMY  09/09/2019  . ROBOTIC ASSISTED TOTAL HYSTERECTOMY WITH BILATERAL SALPINGO OOPHERECTOMY N/A 10/22/2019   Procedure: XI  ROBOTIC ASSISTED TOTAL HYSTERECTOMY WITH BILATERAL SALPINGO OOPHORECTOMY GREATER THAN 250 GRAMS, MINI LAPARTOMY FOR SPECIMEN DELIVERY; PELVIC AND PERI-AORTIC LYMPHADENECTOMY;  Surgeon: Everitt Amber, MD;  Location: [facility];  Service: Gynecology;  Laterality: N/A;  . SENTINEL NODE BIOPSY N/A 10/22/2019   Procedure: SENTINEL NODE BIOPSY;  Surgeon: Everitt Amber, MD;  Location: [facility];  Service: Gynecology;  Laterality: N/A;    Current Outpatient Medications  Medication Sig Dispense Refill  . acetaminophen (TYLENOL) 500 MG tablet Take 1,000 mg by mouth every 6 (six) hours as needed for moderate pain.    Marland Kitchen ALPRAZolam (XANAX) 0.25 MG tablet TAKE 1 TABLET BY MOUTH 2 TIMES DAILY AS NEEDED FOR ANXIETY. 30 tablet 0  . apixaban (ELIQUIS) 5 MG TABS tablet Take 1 tablet (5 mg total) by mouth 2 (two) times daily. 60 tablet 3  . APPLE CIDER VINEGAR PO Take 5 mLs by mouth daily.     Marland Kitchen atenolol (TENORMIN) 25 MG tablet Take 1 tablet (25 mg total) by mouth daily. 30 tablet 3  . calcium carbonate (OSCAL) 1500 (600 Ca) MG TABS tablet Take by mouth daily with breakfast.    . loratadine (CLARITIN) 10 MG tablet Take 10 mg by mouth daily as needed for allergies.    . Multiple Vitamin (MULTIVITAMIN) capsule Take 1 capsule by mouth daily.    . ondansetron (ZOFRAN) 8 MG tablet Take 1 tablet (8 mg total) by mouth every 8 (eight) hours as needed. Start on the third day after chemotherapy. 30 tablet 1  . polyethylene glycol (MIRALAX / GLYCOLAX) 17 g packet Take 17 g by mouth daily as needed for mild constipation.    . prochlorperazine (COMPAZINE) 10 MG tablet Take 1 tablet (10 mg total) by mouth every 6 (six) hours as needed (Nausea or vomiting). 30 tablet 1   No current facility-administered medications for this encounter.    Allergies  Allergen Reactions  . Benicar Hct [Olmesartan Medoxomil-Hctz] Shortness Of Breath and Palpitations  . Iodinated Diagnostic Agents Hives and Itching    02/02/2021-  pt developed 2 hives and  itching even with the 13hr premedication.  Radiology PA came into eval the pt.  Developed itching and hives after injection on 05/10/20; needs 13hr prep in future  . Bactrim [Sulfamethoxazole-Trimethoprim] Other (See Comments)    Abdominal pain, dizziness  . Pravastatin Other (See Comments)    Lower abdominal pain  . Shellfish Allergy Nausea And Vomiting  . Amlodipine Palpitations    Social History   Socioeconomic History  . Marital status: Widowed    Spouse name: Not on file  . Number of children: Not on file  . Years of education: Not on file  . Highest education level: Not on file  Occupational History  . Not on file  Tobacco Use  . Smoking status: Never Smoker  . Smokeless tobacco: Never Used  . Tobacco comment: few puffs but not a true smoker quit many yrs ago  Vaping Use  . Vaping Use: Never used  Substance and Sexual Activity  . Alcohol use:  Never  . Drug use: Never  . Sexual activity: Not Currently  Other Topics Concern  . 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  Physical Exam: Vitals:   04/27/21 1122  BP: 134/90  Pulse: (!) 55  Weight: 91.4 kg  Height: 5\' 4"  (1.626 m)    GEN- The patient is a well appearing obese female, alert and oriented x 3 today.   HEENT-head normocephalic, atraumatic, sclera clear, conjunctiva pink, hearing intact, trachea midline. Lungs- Clear to ausculation bilaterally, normal work of breathing Heart- Regular rate and rhythm, bradycardia, no murmurs, rubs or gallops   GI- soft, NT, ND, + BS Extremities- no clubbing, cyanosis, or edema MS- no significant deformity or atrophy Skin- no rash or lesion Psych- euthymic mood, full affect Neuro- strength and sensation are intact   Wt Readings from Last 3 Encounters:  04/27/21 91.4 kg  03/28/21 90.3 kg  03/22/21 89.6 kg    EKG today demonstrates  SB Vent. rate 55 BPM PR interval 198 ms QRS duration 80 ms QT/QTcB 414/396 ms  Echo 03/06/21 demonstrated 1. Left ventricular ejection fraction, by estimation, is 55 to 60%. The  left ventricle has normal function. The left ventricle has no regional  wall motion abnormalities. Left ventricular diastolic parameters are  consistent with Grade I diastolic  dysfunction (impaired relaxation).  2. Right ventricular systolic function is normal. The right ventricular  size is normal. There is normal pulmonary artery systolic pressure.  3. The mitral valve is abnormal. Trivial mitral valve regurgitation.  4. The aortic valve is tricuspid. Aortic valve regurgitation is not  visualized.  5. Pulmonic valve: Mild leaflet calcification - no stenosis.  6. The inferior vena cava is normal in size with <50% respiratory  variability, suggesting right atrial pressure of 8 mmHg.   Epic records are reviewed at length today  CHA2DS2-VASc Score = 3  The patient's score is based upon: CHF History: No HTN History: Yes Diabetes History: No Stroke History: No Vascular Disease History: No Age Score: 1 Gender Score: 1      ASSESSMENT AND PLAN: 1. Paroxysmal Atrial Fibrillation (ICD10:  I48.0) The patient's CHA2DS2-VASc score is 3, indicating a 3.2% annual risk of stroke.   Patient appears to be maintaining SR. Continue Eliquis 5 mg BID Continue atenolol 25 mg daily  2. Secondary Hypercoagulable State (ICD10:  D68.69) The patient is at significant risk for stroke/thromboembolism based upon her CHA2DS2-VASc Score of 3.  Continue Apixaban (Eliquis).   3.  Obesity Body mass index is 34.6 kg/m. Lifestyle modification was discussed and encouraged including regular physical activity and weight reduction.  4. HTN Stable, no changes today.  5. Generalized weakness/lightheadedness Patient admits she does not drink much after swimming. Suspect episodes related to dehydration/hypoglycemia. Encouraged adequate hydration and eating a snack post exercise.    Patient requesting to establish care with cardiologist, specifically requested Dr Tamala Julian if available.  [facility] [address] [place], [place] [address] [phone] 04/27/2021 11:28 AM

## 2021-04-28 ENCOUNTER — Other Ambulatory Visit (HOSPITAL_COMMUNITY): Payer: Self-pay

## 2021-04-28 MED ORDER — ZOSTER VAC RECOMB ADJUVANTED 50 MCG/0.5ML IM SUSR
0.5000 mL | Freq: Once | INTRAMUSCULAR | 1 refills | Status: AC
  Filled 2021-04-28: qty 0.5, 1d supply, fill #0
  Filled 2021-07-04: qty 0.5, 1d supply, fill #1

## 2021-05-01 ENCOUNTER — Other Ambulatory Visit (HOSPITAL_COMMUNITY): Payer: Self-pay

## 2021-05-03 ENCOUNTER — Ambulatory Visit: Payer: Medicare Other | Attending: Critical Care Medicine

## 2021-05-03 DIAGNOSIS — Z20822 Contact with and (suspected) exposure to covid-19: Secondary | ICD-10-CM

## 2021-05-04 ENCOUNTER — Other Ambulatory Visit (HOSPITAL_COMMUNITY): Payer: Self-pay

## 2021-05-04 LAB — NOVEL CORONAVIRUS, NAA: SARS-CoV-2, NAA: NOT DETECTED

## 2021-05-12 ENCOUNTER — Other Ambulatory Visit: Payer: Self-pay | Admitting: Hematology and Oncology

## 2021-05-12 DIAGNOSIS — C541 Malignant neoplasm of endometrium: Secondary | ICD-10-CM

## 2021-05-17 ENCOUNTER — Ambulatory Visit (HOSPITAL_COMMUNITY)
Admission: RE | Admit: 2021-05-17 | Discharge: 2021-05-17 | Disposition: A | Discharge status: Home / Self Care | Payer: Medicare Other | Source: Ambulatory Visit | Attending: Hematology and Oncology | Admitting: Hematology and Oncology

## 2021-05-17 ENCOUNTER — Inpatient Hospital Stay: Payer: Medicare Other | Attending: Gynecologic Oncology

## 2021-05-17 ENCOUNTER — Other Ambulatory Visit: Payer: Medicare Other

## 2021-05-17 ENCOUNTER — Other Ambulatory Visit: Payer: Self-pay

## 2021-05-17 DIAGNOSIS — Z9221 Personal history of antineoplastic chemotherapy: Secondary | ICD-10-CM | POA: Diagnosis not present

## 2021-05-17 DIAGNOSIS — C787 Secondary malignant neoplasm of liver and intrahepatic bile duct: Secondary | ICD-10-CM | POA: Diagnosis not present

## 2021-05-17 DIAGNOSIS — D563 Thalassemia minor: Secondary | ICD-10-CM | POA: Insufficient documentation

## 2021-05-17 DIAGNOSIS — C541 Malignant neoplasm of endometrium: Secondary | ICD-10-CM | POA: Insufficient documentation

## 2021-05-17 DIAGNOSIS — K5730 Diverticulosis of large intestine without perforation or abscess without bleeding: Secondary | ICD-10-CM | POA: Diagnosis not present

## 2021-05-17 DIAGNOSIS — Z79899 Other long term (current) drug therapy: Secondary | ICD-10-CM | POA: Diagnosis not present

## 2021-05-17 DIAGNOSIS — I700 Atherosclerosis of aorta: Secondary | ICD-10-CM | POA: Diagnosis not present

## 2021-05-17 DIAGNOSIS — Z7189 Other specified counseling: Secondary | ICD-10-CM

## 2021-05-17 DIAGNOSIS — I10 Essential (primary) hypertension: Secondary | ICD-10-CM | POA: Insufficient documentation

## 2021-05-17 DIAGNOSIS — R590 Localized enlarged lymph nodes: Secondary | ICD-10-CM | POA: Diagnosis not present

## 2021-05-17 LAB — CBC WITH DIFFERENTIAL (CANCER CENTER ONLY)
Abs Immature Granulocytes: 0.02 10*3/uL (ref 0.00–0.07)
Basophils Absolute: 0 10*3/uL (ref 0.0–0.1)
Basophils Relative: 1 %
Eosinophils Absolute: 0.1 10*3/uL (ref 0.0–0.5)
Eosinophils Relative: 2 %
HCT: 33.3 % — ABNORMAL LOW (ref 36.0–46.0)
Hemoglobin: 11.4 g/dL — ABNORMAL LOW (ref 12.0–15.0)
Immature Granulocytes: 1 %
Lymphocytes Relative: 36 %
Lymphs Abs: 1.5 10*3/uL (ref 0.7–4.0)
MCH: 26.6 pg (ref 26.0–34.0)
MCHC: 34.2 g/dL (ref 30.0–36.0)
MCV: 77.8 fL — ABNORMAL LOW (ref 80.0–100.0)
Monocytes Absolute: 0.2 10*3/uL (ref 0.1–1.0)
Monocytes Relative: 6 %
Neutro Abs: 2.3 10*3/uL (ref 1.7–7.7)
Neutrophils Relative %: 54 %
Platelet Count: 211 10*3/uL (ref 150–400)
RBC: 4.28 MIL/uL (ref 3.87–5.11)
RDW: 18.5 % — ABNORMAL HIGH (ref 11.5–15.5)
WBC Count: 4.1 10*3/uL (ref 4.0–10.5)
nRBC: 0 % (ref 0.0–0.2)

## 2021-05-17 LAB — CMP (CANCER CENTER ONLY)
ALT: 32 U/L (ref 0–44)
AST: 54 U/L — ABNORMAL HIGH (ref 15–41)
Albumin: 3.8 g/dL (ref 3.5–5.0)
Alkaline Phosphatase: 55 U/L (ref 38–126)
Anion gap: 14 (ref 5–15)
BUN: 9 mg/dL (ref 8–23)
CO2: 24 mmol/L (ref 22–32)
Calcium: 9.3 mg/dL (ref 8.9–10.3)
Chloride: 102 mmol/L (ref 98–111)
Creatinine: 1.12 mg/dL — ABNORMAL HIGH (ref 0.44–1.00)
GFR, Estimated: 54 mL/min — ABNORMAL LOW (ref >60)
Glucose, Bld: 96 mg/dL (ref 70–99)
Potassium: 3.8 mmol/L (ref 3.5–5.1)
Sodium: 140 mmol/L (ref 135–145)
Total Bilirubin: 0.7 mg/dL (ref 0.3–1.2)
Total Protein: 7.5 g/dL (ref 6.5–8.1)

## 2021-05-17 MED ORDER — SODIUM CHLORIDE 0.9% FLUSH
10.0000 mL | Freq: Once | INTRAVENOUS | Status: AC
  Administered 2021-05-17: 10 mL
  Filled 2021-05-17: qty 10

## 2021-05-17 MED ORDER — HEPARIN SOD (PORK) LOCK FLUSH 100 UNIT/ML IV SOLN
INTRAVENOUS | Status: AC
  Filled 2021-05-17: qty 5

## 2021-05-18 ENCOUNTER — Other Ambulatory Visit: Payer: Self-pay

## 2021-05-18 ENCOUNTER — Ambulatory Visit: Payer: Medicare Other | Admitting: Hematology and Oncology

## 2021-05-18 ENCOUNTER — Encounter: Payer: Self-pay | Admitting: Hematology and Oncology

## 2021-05-18 ENCOUNTER — Inpatient Hospital Stay (HOSPITAL_BASED_OUTPATIENT_CLINIC_OR_DEPARTMENT_OTHER): Payer: Medicare Other | Admitting: Hematology and Oncology

## 2021-05-18 DIAGNOSIS — I10 Essential (primary) hypertension: Secondary | ICD-10-CM

## 2021-05-18 DIAGNOSIS — C787 Secondary malignant neoplasm of liver and intrahepatic bile duct: Secondary | ICD-10-CM

## 2021-05-18 DIAGNOSIS — Z9221 Personal history of antineoplastic chemotherapy: Secondary | ICD-10-CM | POA: Diagnosis not present

## 2021-05-18 DIAGNOSIS — D563 Thalassemia minor: Secondary | ICD-10-CM

## 2021-05-18 DIAGNOSIS — C541 Malignant neoplasm of endometrium: Secondary | ICD-10-CM | POA: Diagnosis not present

## 2021-05-18 DIAGNOSIS — Z79899 Other long term (current) drug therapy: Secondary | ICD-10-CM | POA: Diagnosis not present

## 2021-05-18 MED ORDER — ATENOLOL 25 MG PO TABS: 25.0000 mg | ORAL_TABLET | Freq: Every day | ORAL | 3 refills | Status: DC

## 2021-05-18 MED ORDER — APIXABAN 5 MG PO TABS: 5.0000 mg | ORAL_TABLET | Freq: Two times a day (BID) | ORAL | 3 refills | Status: DC

## 2021-05-18 NOTE — Assessment & Plan Note (Addendum)
Her blood pressure is within normal limits Observed for now

## 2021-05-18 NOTE — Assessment & Plan Note (Signed)
I have reviewed her imaging study extensively with the patient before the report was made available Overall, despite limitation with IV contrast, she have no signs of residual disease The abnormal lymphadenopathy seen is nonspecific She is not symptomatic Plan to observe only I will see her back in about 8 weeks with repeat blood work and close observation and she is in agreement

## 2021-05-18 NOTE — Progress Notes (Signed)
[place] OFFICE PROGRESS NOTE  Patient Care Team: Isaac Bliss, Rayford Halsted, MD as PCP - General (Internal Medicine) Axel Filler, MD  ASSESSMENT & PLAN:  Endometrial cancer [facility]) I have reviewed her imaging study extensively with the patient before the report was made available Overall, despite limitation with IV contrast, she have no signs of residual disease The abnormal lymphadenopathy seen is nonspecific She is not symptomatic Plan to observe only I will see her back in about 8 weeks with repeat blood work and close observation and she is in agreement  Beta thalassemia trait She has thalassemia trait She is not symptomatic Observe only  Essential hypertension Her blood pressure is within normal limits Observed for now  Metastasis to liver [facility]) The rest of her liver enzymes are normal except for very mild borderline elevated AST This does not represent disease She had multiple cysts that are stable Observe for now   No orders of the defined types were placed in this encounter.   All questions were answered. The patient knows to call the clinic with any problems, questions or concerns. The total time spent in the appointment was 20 minutes encounter with patients including review of chart and various tests results, discussions about plan of care and coordination of care plan   [name], MD 05/18/2021 11:12 AM  INTERVAL HISTORY: Please see below for problem oriented charting. She returns for further follow-up She continues to have mild constipation but does not bother her No nausea, abdominal pain or bloating The patient denies any recent signs or symptoms of bleeding such as spontaneous epistaxis, hematuria or hematochezia.   SUMMARY OF ONCOLOGIC HISTORY: Oncology History Overview Note  Poorly differentiated carcinoma, mixed histology with squamous differentiation, rare focus of clear cells as well as serous features MSI  stable Her2 negative   Endometrial cancer ([place])  09/01/2019 Initial Diagnosis   The patient reported a history of postmenopausal bleeding that began 1 to 2 months before diagnosis   09/14/2019 Imaging   US pelvis 1. Enlarged uterus with numerous myometrial masses, presumably fibroids. 2. Endometrial thickness of 6.2 mm. In the setting of post-menopausal bleeding, endometrial sampling is indicated to exclude carcinoma. If results are benign, sonohysterogram should be considered for focal lesion work-up.  3. Nonvisualized ovaries   09/23/2019 Pathology Results   A. ENDOMETRIUM, BIOPSY:  - Poorly differentiated carcinoma   10/15/2019 Imaging   Ct scan of chest, abdomen and pelvis: No evidence of metastatic disease within the chest, abdomen, or pelvis.   Enlarged fibroid uterus.   Colonic diverticulosis. No radiographic evidence of diverticulitis.   Aortic and coronary artery atherosclerosis.     10/22/2019 Pathology Results   SURGICAL PATHOLOGY   FINAL MICROSCOPIC DIAGNOSIS:   A. UTERUS, BILATERAL TUBES AND OVARIES, HYSTERECTOMY:  Poorly differentiated carcinoma, 6.5 cm.  Lymphovascular involvement by tumor.  Carcinoma involves inner half of the myometrium.  Margins not involved.  Cervix, bilateral fallopian tubes and bilateral ovaries free of tumor.   B. LYMPH NODE, RIGHT EXTERNAL SENTINEL, BIOPSY:  One lymph node with no metastatic carcinoma (0/1).   C. LYMPH NODE, RIGHT PELVIC, BIOPSY  Five lymph nodes with no metastatic carcinoma (0/5).   D. LYMPH NODE, LEFT PELVIC, BIOPSY:  Five lymph nodes with no metastatic carcinoma (0/5).   E. LYMPH NODE, RIGHT PERI AORTIC, BIOPSY:  One lymph node with no metastatic carcinoma (0/1).   F. LYMPH NODE, LEFT PERI AORTIC, BIOPSY:  Five lymph nodes with no metastatic carcinoma (0/5).  ONCOLOGY TABLE:  UTERUS, CARCINOMA OR CARCINOSARCOMA   Procedure: Total hysterectomy with bilateral f-oophorectomy and sentinel  lymph nodes.   Histologic type: Poorly differentiated carcinoma, see comment.  Histologic Grade: High-grade, FIGO 3.  Myometrial invasion:       Depth of invasion: 13 mm       Myometrial thickness: 40 mm  Uterine Serosa Involvement: Not identified  Cervical stromal involvement: Not identified  Extent of involvement of other organs: Not identified  Lymphovascular invasion: Present  Regional Lymph Nodes:       Examined:     17 Sentinel                               0 non-sentinel                               17 total        Lymph nodes with metastasis: 0        Isolated tumor cells (<0.2 mm): 0        Micrometastasis:  (>0.2 mm and < 2.0 mm): 0        Macrometastasis: (>2.0 mm): 0  Representative Tumor Block: A5, A6, A7 and A8.  MMR / MSI testing: Pending  Pathologic Stage Classification (pTNM, AJCC 8th edition):  pT1a, pN0  Comments: The carcinoma is a high-grade poorly differentiated carcinoma which morphologically has predominantly serous features.  There are a few foci with squamous differentiation and a rare focus of clear cell features.  Immunohistochemistry for cytokeratin AE1/AE3 is performed on the sentinel lymph nodes and no positivity is identified.    10/22/2019 Surgery   Pre-operative Diagnosis: endometrial cancer grade 3   Post-operative Diagnosis: same,    Operation: Robotic-assisted laparoscopic total hysterectomy for uterus >250gm with bilateral salpingoophorectomy, SLN mapping, bilateral pelvic and para-aortic lymphadenectomy.   Surgeon: Donaciano Eva    Operative Findings:  : 16cm bulky fibroid uterus, normal ovaries bilaterally, no suspicious lymph nodes.     11/10/2019 Cancer Staging   Staging form: Corpus Uteri - Carcinoma and Carcinosarcoma, AJCC 8th Edition - Pathologic: Stage IVB (pT1a, pN0, cM1) - Signed by [name], MD on 05/16/2020   05/10/2020 Imaging   1. New hypodense 2.0 cm segment 4A left liver lobe mass, suspicious for hepatic metastasis. 2. New  left pelvic sidewall 1.4 cm soft tissue nodule, suspicious for left internal iliac nodal metastasis. 3. New left vaginal cuff 1.6 x 1.3 cm soft tissue nodule, suspicious for recurrent tumor. 4. Aortic Atherosclerosis (ICD10-I70.0).   05/18/2020 Procedure   Successful placement of a right internal jugular approach power injectable Port-A-Cath. The catheter is ready for immediate use.   05/23/2020 -  Chemotherapy   The patient had carboplatin and taxol for chemotherapy treatment.     07/28/2020 Imaging   1. No findings identified to suggest recurrent or metastatic disease. 2. Indeterminate and slightly exophytic lesion arising from the inferior pole of left kidney measures 0.8 cm. Technically this is too small to reliably characterize. Attention on follow-up imaging is advised. 3. Aortic atherosclerosis and LAD coronary artery atherosclerotic calcifications.   11/07/2020 Imaging   1. Interval progression of right pelvic sidewall lymphadenopathy, highly concerning for disease progression. PET-CT may prove helpful to further evaluate as clinically warranted. 2. Interval resolution of the previously identified hypodense lesion in segment IVA of the liver. 3. Stable 7 mm subcapsular lesion in  the lower pole left kidney with attenuation higher than would be expected for a simple cyst but is too small to reliably characterize. Attention on follow-up recommended. 4. Aortic Atherosclerosis (ICD10-I70.0).   11/16/2020 - 01/04/2021 Chemotherapy         02/02/2021 Imaging   1. Significant interval decrease in size in right pelvic sidewall, iliac, and inguinal lymph nodes, consistent with treatment response of nodal metastatic disease. 2. Multiple low-attenuation lesions throughout the liver, unchanged compared to prior examination, the majority of these consistent with simple cysts. A previously noted metastatic lesion of anterior hepatic segment VII remains resolved. No new lesions. Findings are consistent  with sustained treatment response of hepatic metastatic disease. 3. Status post hysterectomy. Unchanged post treatment appearance of left vaginal cuff and pelvic sidewall soft tissue, consistent with sustained treatment response. 4. No evidence of metastatic disease in the chest.   Aortic Atherosclerosis (ICD10-I70.0).     05/17/2021 Imaging   Mild right external iliac lymphadenopathy, without significant change. No new or progressive disease within the abdomen or pelvis.   Colonic diverticulosis. No radiographic evidence of diverticulitis.   Aortic Atherosclerosis (ICD10-I70.0).     REVIEW OF SYSTEMS:   Constitutional: Denies fevers, chills or abnormal weight loss Eyes: Denies blurriness of vision Ears, nose, mouth, throat, and face: Denies mucositis or sore throat Respiratory: Denies cough, dyspnea or wheezes Cardiovascular: Denies palpitation, chest discomfort or lower extremity swelling Skin: Denies abnormal skin rashes Lymphatics: Denies new lymphadenopathy or easy bruising Neurological:Denies numbness, tingling or new weaknesses Behavioral/Psych: Mood is stable, no new changes  All other systems were reviewed with the patient and are negative.  I have reviewed the past medical history, past surgical history, social history and family history with the patient and they are unchanged from previous note.  ALLERGIES:  is allergic to benicar hct [olmesartan medoxomil-hctz], iodinated diagnostic agents, bactrim [sulfamethoxazole-trimethoprim], pravastatin, shellfish allergy, and amlodipine.  MEDICATIONS:  Current Outpatient Medications  Medication Sig Dispense Refill  . acetaminophen (TYLENOL) 500 MG tablet Take 1,000 mg by mouth every 6 (six) hours as needed for moderate pain.    Marland Kitchen ALPRAZolam (XANAX) 0.25 MG tablet TAKE 1 TABLET BY MOUTH 2 TIMES DAILY AS NEEDED FOR ANXIETY. 30 tablet 0  . apixaban (ELIQUIS) 5 MG TABS tablet Take 1 tablet (5 mg total) by mouth 2 (two) times daily.  60 tablet 3  . APPLE CIDER VINEGAR PO Take 5 mLs by mouth daily.     Marland Kitchen atenolol (TENORMIN) 25 MG tablet Take 1 tablet (25 mg total) by mouth daily. 30 tablet 3  . calcium carbonate (OSCAL) 1500 (600 Ca) MG TABS tablet Take by mouth daily with breakfast.    . loratadine (CLARITIN) 10 MG tablet Take 10 mg by mouth daily as needed for allergies.    . Multiple Vitamin (MULTIVITAMIN) capsule Take 1 capsule by mouth daily.    . ondansetron (ZOFRAN) 8 MG tablet Take 1 tablet (8 mg total) by mouth every 8 (eight) hours as needed. Start on the third day after chemotherapy. 30 tablet 1  . polyethylene glycol (MIRALAX / GLYCOLAX) 17 g packet Take 17 g by mouth daily as needed for mild constipation.    . prochlorperazine (COMPAZINE) 10 MG tablet Take 1 tablet (10 mg total) by mouth every 6 (six) hours as needed (Nausea or vomiting). 30 tablet 1   No current facility-administered medications for this visit.    PHYSICAL EXAMINATION: ECOG PERFORMANCE STATUS: 1 - Symptomatic but completely ambulatory  Vitals:  05/18/21 0851  BP: 131/87  Pulse: 65  Resp: 18  Temp: 99.2 F (37.3 C)  SpO2: 100%   Filed Weights   05/18/21 0851  Weight: 200 lb 9.6 oz (91 kg)    GENERAL:alert, no distress and comfortable.  She appears anxious NEURO: alert & oriented x 3 with fluent speech, no focal motor/sensory deficits  LABORATORY DATA:  I have reviewed the data as listed    Component Value Date/Time   NA 140 05/17/2021 0745   NA 140 07/04/2017 0952   K 3.8 05/17/2021 0745   CL 102 05/17/2021 0745   CO2 24 05/17/2021 0745   GLUCOSE 96 05/17/2021 0745   BUN 9 05/17/2021 0745   BUN 10 07/04/2017 0952   CREATININE 1.12 (H) 05/17/2021 0745   CREATININE 0.81 03/23/2015 0946   CALCIUM 9.3 05/17/2021 0745   PROT 7.5 05/17/2021 0745   ALBUMIN 3.8 05/17/2021 0745   AST 54 (H) 05/17/2021 0745   ALT 32 05/17/2021 0745   ALKPHOS 55 05/17/2021 0745   BILITOT 0.7 05/17/2021 0745   GFRNONAA 54 (L) 05/17/2021 0745    GFRNONAA 78 03/23/2015 0946   GFRAA >60 09/12/2020 0853   GFRAA >89 03/23/2015 0946    No results found for: SPEP, UPEP  Lab Results  Component Value Date   WBC 4.1 05/17/2021   NEUTROABS 2.3 05/17/2021   HGB 11.4 (L) 05/17/2021   HCT 33.3 (L) 05/17/2021   MCV 77.8 (L) 05/17/2021   PLT 211 05/17/2021      Chemistry      Component Value Date/Time   NA 140 05/17/2021 0745   NA 140 07/04/2017 0952   K 3.8 05/17/2021 0745   CL 102 05/17/2021 0745   CO2 24 05/17/2021 0745   BUN 9 05/17/2021 0745   BUN 10 07/04/2017 0952   CREATININE 1.12 (H) 05/17/2021 0745   CREATININE 0.81 03/23/2015 0946      Component Value Date/Time   CALCIUM 9.3 05/17/2021 0745   ALKPHOS 55 05/17/2021 0745   AST 54 (H) 05/17/2021 0745   ALT 32 05/17/2021 0745   BILITOT 0.7 05/17/2021 0745       RADIOGRAPHIC STUDIES: I have reviewed imaging study with the patient I have personally reviewed the radiological images as listed and agreed with the findings in the report. CT Abdomen Pelvis Wo Contrast  Result Date: 05/18/2021 CLINICAL DATA:  Follow-up metastatic endometrial carcinoma. Status post chemotherapy. EXAM: CT ABDOMEN AND PELVIS WITHOUT CONTRAST TECHNIQUE: Multidetector CT imaging of the abdomen and pelvis was performed following the standard protocol without IV contrast. COMPARISON:  02/02/2021 FINDINGS: Lower chest: No acute findings. Hepatobiliary: No mass visualized on this unenhanced exam. Several small cysts in both right and left lobes remain stable. Prior cholecystectomy. No evidence of biliary obstruction. Pancreas: No mass or inflammatory process visualized on this unenhanced exam. Spleen:  Within normal limits in size. Adrenals/Urinary tract: A subcapsular hyperdense lesion measuring 9 mm is again seen in the lateral lower pole the left kidney which is stable and likely represents a proteinaceous or hemorrhagic cyst. No evidence of urolithiasis or hydronephrosis. Unremarkable unopacified  urinary bladder. Stomach/Bowel: No evidence of obstruction, inflammatory process, or abnormal fluid collections. Diverticulosis is seen mainly involving the sigmoid colon, however there is no evidence of diverticulitis. Vascular/Lymphatic: Mild lymphadenopathy in the right external iliac chain shows no significant change, with largest lymph node measuring 15 mm on image 69/2. No new areas of lymphadenopathy identified. Aortic atherosclerotic calcification noted. No evidence of abdominal  aortic aneurysm. Reproductive: Prior hysterectomy noted. No evidence of recurrent pelvic mass. Adnexal regions are unremarkable in appearance. Other:  None. Musculoskeletal:  No suspicious bone lesions identified. IMPRESSION: Mild right external iliac lymphadenopathy, without significant change. No new or progressive disease within the abdomen or pelvis. Colonic diverticulosis. No radiographic evidence of diverticulitis. Aortic Atherosclerosis (ICD10-I70.0). Electronically Signed   By: Marlaine Hind M.D.   On: 05/18/2021 09:18

## 2021-05-18 NOTE — Assessment & Plan Note (Signed)
The rest of her liver enzymes are normal except for very mild borderline elevated AST This does not represent disease She had multiple cysts that are stable Observe for now

## 2021-05-18 NOTE — Assessment & Plan Note (Signed)
She has thalassemia trait She is not symptomatic Observe only 

## 2021-05-27 ENCOUNTER — Other Ambulatory Visit (HOSPITAL_COMMUNITY): Payer: Self-pay | Admitting: Physician Assistant

## 2021-05-30 ENCOUNTER — Encounter: Payer: Self-pay | Admitting: Hematology and Oncology

## 2021-05-30 ENCOUNTER — Telehealth: Payer: Self-pay | Admitting: Internal Medicine

## 2021-05-30 NOTE — Telephone Encounter (Signed)
Patient called and is requesting to be a TOC to Dr. Ronnald Ramp. Please advise

## 2021-05-30 NOTE — Telephone Encounter (Signed)
Ok with me 

## 2021-06-07 ENCOUNTER — Encounter: Payer: Self-pay | Admitting: Physical Therapy

## 2021-06-12 ENCOUNTER — Other Ambulatory Visit: Payer: Self-pay

## 2021-06-12 ENCOUNTER — Ambulatory Visit (HOSPITAL_COMMUNITY)
Admission: RE | Admit: 2021-06-12 | Discharge: 2021-06-12 | Disposition: A | Discharge status: Home / Self Care | Payer: Medicare Other | Source: Ambulatory Visit | Attending: Physician Assistant | Admitting: Physician Assistant

## 2021-06-12 ENCOUNTER — Encounter (HOSPITAL_COMMUNITY): Payer: Self-pay | Admitting: Physician Assistant

## 2021-06-12 VITALS — BP 134/80 | HR 58 | Ht 64.0 in | Wt 204.6 lb

## 2021-06-12 DIAGNOSIS — Z79899 Other long term (current) drug therapy: Secondary | ICD-10-CM | POA: Insufficient documentation

## 2021-06-12 DIAGNOSIS — I10 Essential (primary) hypertension: Secondary | ICD-10-CM | POA: Insufficient documentation

## 2021-06-12 DIAGNOSIS — D6869 Other thrombophilia: Secondary | ICD-10-CM | POA: Diagnosis not present

## 2021-06-12 DIAGNOSIS — Z6835 Body mass index (BMI) 35.0-35.9, adult: Secondary | ICD-10-CM | POA: Diagnosis not present

## 2021-06-12 DIAGNOSIS — E669 Obesity, unspecified: Secondary | ICD-10-CM | POA: Insufficient documentation

## 2021-06-12 DIAGNOSIS — Z7901 Long term (current) use of anticoagulants: Secondary | ICD-10-CM | POA: Diagnosis not present

## 2021-06-12 DIAGNOSIS — Z8249 Family history of ischemic heart disease and other diseases of the circulatory system: Secondary | ICD-10-CM | POA: Diagnosis not present

## 2021-06-12 DIAGNOSIS — I480 Paroxysmal atrial fibrillation: Secondary | ICD-10-CM | POA: Diagnosis not present

## 2021-06-12 NOTE — Progress Notes (Signed)
Primary Care Physician: Isaac Bliss, Rayford Halsted, MD Primary Cardiologist: none Primary Electrophysiologist: none Referring Physician: [facility]   Tina Stone is a 71 y.o. female with a history of endometrial cancer followed by Dr Alvy Bimler, HTN, and atrial fibrillation who presents [facility]. The patient was initially diagnosed with atrial fibrillation 01/28/21 after presenting with symptoms of palpitations. EMS reports the patient was initially in SVT with a fast heart rate close to 200.  Patient was given adenosine multiple times and was noted to be in atrial fibrillation.  Patient was started on Cardizem and she converted to sinus tachycardia. Patient is on Eliquis for a CHADS2VASC score of 3. She returned to the ED 01/30/21 with blood on tissue after BM. Negative hemoccult in ED. She denies any snoring or alcohol use.   On follow up today, patient reports that she has done well since her last visit. She denies any heart racing or palpitations. She does note some weakness in her hip muscles after standing for long periods of time. She admits she is not very active. She denies any bleeding issues on anticoagulation.   Today, she denies symptoms of palpitations, chest pain, shortness of breath, orthopnea, PND, lower extremity edema, presyncope, syncope, snoring, daytime somnolence, bleeding, or neurologic sequela. The patient is tolerating medications without difficulties and is otherwise without complaint today.    Atrial Fibrillation Risk Factors:  she does not have symptoms or diagnosis of sleep apnea. she does not have a history of rheumatic fever. she does not have a history of alcohol use. The patient does not have a history of early familial atrial fibrillation or other arrhythmias.  she has a BMI of Body mass index is 35.12 kg/m.Marland Kitchen Filed Weights   06/12/21 0847  Weight: 92.8 kg     Family History  Problem Relation  Age of Onset   Diabetes Mother    Hypertension Mother    Colon polyps Mother 59   Dementia Mother 16   Diabetes Father    Congestive Heart Failure Father    Pancreatic cancer Paternal Aunt    Colon cancer Neg Hx    Breast cancer Neg Hx    Lung cancer Neg Hx    Esophageal cancer Neg Hx    Rectal cancer Neg Hx    Stomach cancer Neg Hx      Atrial Fibrillation Management history:  Previous antiarrhythmic drugs: none Previous cardioversions: none Previous ablations: none CHADS2VASC score: 3 Anticoagulation history: Eliquis   Past Medical History:  Diagnosis Date   Adenomatous colon polyp    Allergic rhinitis, seasonal    Allergy    Beta thalassemia trait 11/25/2013   Cholelithiasis    Class 3 obesity without serious comorbidity with body mass index (BMI) of 40.0 to 44.9 in adult 11/19/2012   endometrial ca dx'd 08/2009   endometrial    GERD (gastroesophageal reflux disease)    HLD (hyperlipidemia)    Hypercholesterolemia    Hypertension 03/18/2017   no meds    Intraductal papilloma of left breast    Patient underwent left needle-localized lumpectomy by Dr. Imogene Burn. Tsuei on 09/09/2013; pathology showed intraductal papilloma with no atypia or malignancy identified.   PONV (postoperative nausea and vomiting)    Pre-diabetes    pt denies   Uterine fibroid    Past Surgical History:  Procedure Laterality Date   ANKLE FRACTURE SURGERY  1966   right   BREAST EXCISIONAL  BIOPSY Left 2014   benign   BREAST LUMPECTOMY WITH NEEDLE LOCALIZATION Left 09/09/2013   Procedure: BREAST LUMPECTOMY WITH NEEDLE LOCALIZATION;  Surgeon: Imogene Burn. Georgette Dover, MD;  Location: [place];  Service: General;  Laterality: Left;   CHOLECYSTECTOMY     COLONOSCOPY     IR IMAGING GUIDED PORT INSERTION  05/18/2020   POLYPECTOMY     ROBOTIC ASSISTED LAPAROSCOPIC CHOLECYSTECTOMY  09/09/2019   ROBOTIC ASSISTED TOTAL HYSTERECTOMY WITH BILATERAL SALPINGO OOPHERECTOMY N/A 10/22/2019   Procedure: XI ROBOTIC  ASSISTED TOTAL HYSTERECTOMY WITH BILATERAL SALPINGO OOPHORECTOMY GREATER THAN 250 GRAMS, MINI LAPARTOMY FOR SPECIMEN DELIVERY; PELVIC AND PERI-AORTIC LYMPHADENECTOMY;  Surgeon: Everitt Amber, MD;  Location: [facility];  Service: Gynecology;  Laterality: N/A;   SENTINEL NODE BIOPSY N/A 10/22/2019   Procedure: SENTINEL NODE BIOPSY;  Surgeon: Everitt Amber, MD;  Location: [facility];  Service: Gynecology;  Laterality: N/A;    Current Outpatient Medications  Medication Sig Dispense Refill   acetaminophen (TYLENOL) 500 MG tablet Take 1,000 mg by mouth every 6 (six) hours as needed for moderate pain.     APPLE CIDER VINEGAR PO Take 5 mLs by mouth daily.      atenolol (TENORMIN) 25 MG tablet TAKE 1 TABLET (25 MG TOTAL) BY MOUTH DAILY. 30 tablet 3   Cholecalciferol (VITAMIN D3) 50 MCG (2000 UT) TABS Take by mouth.     ELIQUIS 5 MG TABS tablet TAKE 1 TABLET BY MOUTH TWICE A DAY 60 tablet 3   loratadine (CLARITIN) 10 MG tablet Take 10 mg by mouth daily as needed for allergies.     Multiple Vitamin (MULTIVITAMIN) capsule Take 1 capsule by mouth daily.     polyethylene glycol (MIRALAX / GLYCOLAX) 17 g packet Take 17 g by mouth daily as needed for mild constipation.     No current facility-administered medications for this encounter.    Allergies  Allergen Reactions   Benicar Hct [Olmesartan Medoxomil-Hctz] Shortness Of Breath and Palpitations   Iodinated Diagnostic Agents Hives and Itching    02/02/2021-  pt developed 2 hives and itching even with the 13hr premedication.  Radiology PA came into eval the pt.  Developed itching and hives after injection on 05/10/20; needs 13hr prep in future   Bactrim [Sulfamethoxazole-Trimethoprim] Other (See Comments)    Abdominal pain, dizziness   Pravastatin Other (See Comments)    Lower abdominal pain   Shellfish Allergy Nausea And Vomiting   Amlodipine Palpitations    Social History   Socioeconomic History   Marital status: Widowed    Spouse name: Not on file    Number of children: Not on file   Years of education: Not on file   Highest education level: Not on file  Occupational History   Not on file  Tobacco Use   Smoking status: Never   Smokeless tobacco: Never   Tobacco comments:    few puffs but not a true smoker quit many yrs ago  Vaping Use   Vaping Use: Never used  Substance and Sexual Activity   Alcohol use: Never   Drug use: Never   Sexual activity: Not Currently  Other Topics Concern   Not on file  Social History Narrative   ** Merged History Encounter **       Lives in [place], widowed 2003   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  Physical Exam: Vitals:   06/12/21 0847  BP: 134/80  Pulse: (!) 58  Weight: 92.8 kg  Height: 5\' 4"  (1.626 m)     GEN- The patient is a well appearing obese female, alert and oriented x 3 today.   HEENT-head normocephalic, atraumatic, sclera clear, conjunctiva pink, hearing intact, trachea midline. Lungs- Clear to ausculation bilaterally, normal work of breathing Heart- Regular rate and rhythm, no murmurs, rubs or gallops  GI- soft, NT, ND, + BS Extremities- no clubbing, cyanosis, or edema MS- no significant deformity or atrophy Skin- no rash or lesion Psych- euthymic mood, full affect Neuro- strength and sensation are intact   Wt Readings from Last 3 Encounters:  06/12/21 92.8 kg  05/18/21 91 kg  04/27/21 91.4 kg    EKG today demonstrates  SB Vent. rate 58 BPM PR interval 200 ms QRS duration 70 ms QT/QTcB 408/400 ms  Echo 03/06/21 demonstrated 1. Left ventricular ejection fraction, by estimation, is 55 to 60%. The   left ventricle has normal function. The left ventricle has no regional  wall motion abnormalities. Left ventricular diastolic parameters are  consistent with Grade I diastolic  dysfunction (impaired relaxation).   2. Right ventricular systolic function is normal. The right ventricular  size is normal. There is normal pulmonary artery systolic pressure.   3. The mitral valve is abnormal. Trivial mitral valve regurgitation.   4. The aortic valve is tricuspid. Aortic valve regurgitation is not  visualized.   5. Pulmonic valve: Mild leaflet calcification - no stenosis.   6. The inferior vena cava is normal in size with <50% respiratory  variability, suggesting right atrial pressure of 8 mmHg.   Epic records are reviewed at length today  CHA2DS2-VASc Score = 3  The patient's score is based upon: CHF History: No HTN History: Yes Diabetes History: No Stroke History: No Vascular Disease History: No Age Score: 1 Gender Score: 1      ASSESSMENT AND PLAN: 1. Paroxysmal Atrial Fibrillation (ICD10:  I48.0) The patient's CHA2DS2-VASc score is 3, indicating a 3.2% annual risk of stroke.   Patient appears to be maintaining SR.  Continue Eliquis 5 mg BID Continue atenolol 25 mg daily  2. Secondary Hypercoagulable State (ICD10:  D68.69) The patient is at significant risk for stroke/thromboembolism based upon her CHA2DS2-VASc Score of 3.  Continue Apixaban (Eliquis).   3. Obesity Body mass index is 35.12 kg/m. Lifestyle modification was discussed and encouraged including regular physical activity and weight reduction.  4. HTN Stable, no changes today.   Patient requesting to establish care with cardiologist. Follow up with AF clinic as needed.    [facility] [address] [place], [place] [address] [phone] 06/12/2021 10:32 AM

## 2021-06-14 ENCOUNTER — Other Ambulatory Visit: Payer: Self-pay | Admitting: Internal Medicine

## 2021-06-14 DIAGNOSIS — Z1231 Encounter for screening mammogram for malignant neoplasm of breast: Secondary | ICD-10-CM

## 2021-06-22 ENCOUNTER — Other Ambulatory Visit (HOSPITAL_COMMUNITY): Payer: Self-pay

## 2021-06-23 ENCOUNTER — Ambulatory Visit: Payer: Medicare Other | Attending: Internal Medicine

## 2021-06-23 DIAGNOSIS — Z20822 Contact with and (suspected) exposure to covid-19: Secondary | ICD-10-CM | POA: Diagnosis not present

## 2021-06-24 LAB — SARS-COV-2, NAA 2 DAY TAT

## 2021-06-24 LAB — NOVEL CORONAVIRUS, NAA: SARS-CoV-2, NAA: NOT DETECTED

## 2021-06-30 ENCOUNTER — Encounter: Payer: Self-pay | Admitting: Hematology and Oncology

## 2021-07-04 ENCOUNTER — Other Ambulatory Visit (HOSPITAL_COMMUNITY): Payer: Self-pay

## 2021-07-04 NOTE — Progress Notes (Signed)
Cardiology Office Note:    Date:  07/05/2021   ID:  Tina Stone, DOB 11-17-1950, MRN [id number]  PCP:  Isaac Bliss, Rayford Halsted, MD  Cardiologist:  None   Referring MD: Oliver Barre, PA   Chief Complaint  Patient presents with   Atrial Fibrillation   Follow-up    Anticoagulation therapy     History of Present Illness:    Tina Stone is a 71 y.o. female with a hx of New PAF with rapid rate > 180 bpm, converted on diltiazem, obesity, essential hypertension,    She has been seen in the atrial fibrillation clinic since developing atrial fibrillation with rapid ventricular response on January 28, 2021.  This episode is correlated with cessation of beta-blocker therapy 24 to 48 hours prior.  She had been placed on Tenormin therapy prior to/during chemotherapy for endometrial cancer.  The reason for discontinuing the beta-blocker is not clear.  She did develop a very rapid heart rate that occurred suddenly.  It was initially felt to be SVT.  Adenosine x2 did not convert the rhythm.  It did identify however that she was in atrial fibrillation.  She was started on IV diltiazem and converted.  Tenormin therapy was restarted.  Because she has an elevated Chads VASC >2, obesity, and age suggesting potential recurrence she has been maintained on long-term antiarrhythmic therapy.  She is here to learn more about atrial fibrillation.  She is concerned about whether she will need to remain on lifelong anticoagulation therapy.  Prior to the episode in January she has not had atrial fibrillation.  The 2D Doppler echocardiogram done as part of the work-up demonstrated a structurally normal heart.  She does not snore (according to her), states that she sleeps well, and does not have exertional dyspnea or chest pain.  Other cardiovascular risk factors include prediabetes, hyperlipidemia, primary hypertension, and obesity.  She is referred by Dr. Wallene Huh.  Past Medical History:   Diagnosis Date   Adenomatous colon polyp    Allergic rhinitis, seasonal    Allergy    Beta thalassemia trait 11/25/2013   Cholelithiasis    Class 3 obesity without serious comorbidity with body mass index (BMI) of 40.0 to 44.9 in adult 11/19/2012   endometrial ca dx'd 08/2009   endometrial    GERD (gastroesophageal reflux disease)    HLD (hyperlipidemia)    Hypercholesterolemia    Hypertension 03/18/2017   no meds    Intraductal papilloma of left breast    Patient underwent left needle-localized lumpectomy by Dr. Imogene Burn. Tsuei on 09/09/2013; pathology showed intraductal papilloma with no atypia or malignancy identified.   PONV (postoperative nausea and vomiting)    Pre-diabetes    pt denies   Uterine fibroid     Past Surgical History:  Procedure Laterality Date   ANKLE FRACTURE SURGERY  1966   right   BREAST EXCISIONAL BIOPSY Left 2014   benign   BREAST LUMPECTOMY WITH NEEDLE LOCALIZATION Left 09/09/2013   Procedure: BREAST LUMPECTOMY WITH NEEDLE LOCALIZATION;  Surgeon: Imogene Burn. Georgette Dover, MD;  Location: [place];  Service: General;  Laterality: Left;   CHOLECYSTECTOMY     COLONOSCOPY     IR IMAGING GUIDED PORT INSERTION  05/18/2020   POLYPECTOMY     ROBOTIC ASSISTED LAPAROSCOPIC CHOLECYSTECTOMY  09/09/2019   ROBOTIC ASSISTED TOTAL HYSTERECTOMY WITH BILATERAL SALPINGO OOPHERECTOMY N/A 10/22/2019   Procedure: XI ROBOTIC ASSISTED TOTAL HYSTERECTOMY WITH BILATERAL SALPINGO OOPHORECTOMY GREATER THAN 250 GRAMS, MINI LAPARTOMY FOR  SPECIMEN DELIVERY; PELVIC AND PERI-AORTIC LYMPHADENECTOMY;  Surgeon: Everitt Amber, MD;  Location: [facility];  Service: Gynecology;  Laterality: N/A;   SENTINEL NODE BIOPSY N/A 10/22/2019   Procedure: SENTINEL NODE BIOPSY;  Surgeon: Everitt Amber, MD;  Location[facility];  Service: Gynecology;  Laterality: N/A;    Current Medications: Current Meds  Medication Sig   acetaminophen (TYLENOL) 500 MG tablet Take 1,000 mg by mouth every 6 (six) hours as needed for  moderate pain.   APPLE CIDER VINEGAR PO Take 5 mLs by mouth daily.    atenolol (TENORMIN) 25 MG tablet TAKE 1 TABLET (25 MG TOTAL) BY MOUTH DAILY.   Cholecalciferol (VITAMIN D3) 50 MCG (2000 UT) TABS Take by mouth.   ELIQUIS 5 MG TABS tablet TAKE 1 TABLET BY MOUTH TWICE A DAY   loratadine (CLARITIN) 10 MG tablet Take 10 mg by mouth daily as needed for allergies.   Multiple Vitamin (MULTIVITAMIN) capsule Take 1 capsule by mouth daily.   polyethylene glycol (MIRALAX / GLYCOLAX) 17 g packet Take 17 g by mouth daily as needed for mild constipation.     Allergies:   Benicar hct [olmesartan medoxomil-hctz], Iodinated diagnostic agents, Bactrim [sulfamethoxazole-trimethoprim], Pravastatin, Shellfish allergy, and Amlodipine   Social History   Socioeconomic History   Marital status: Widowed    Spouse name: Not on file   Number of children: Not on file   Years of education: Not on file   Highest education level: Not on file  Occupational History   Not on file  Tobacco Use   Smoking status: Never   Smokeless tobacco: Never   Tobacco comments:    few puffs but not a true smoker quit many yrs ago  Vaping Use   Vaping Use: Never used  Substance and Sexual Activity   Alcohol use: Never   Drug use: Never   Sexual activity: Not Currently  Other Topics Concern   Not on file  Social History Narrative   ** Merged History Encounter **       Lives in [place], widowed 2003   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 There is no history of Colon cancer, Breast cancer, Lung cancer, Esophageal cancer, Rectal cancer, or Stomach cancer.  ROS:   Please see the history of present illness.    She is concerned about her knowledge base of atrial fibrillation.  She also has endometrial cancer and has concerns about her prognosis.  Wants to be sure that her treatment regimen for atrial fibs appropriate.  All other systems reviewed and are negative.  EKGs/Labs/Other Studies Reviewed:    The following studies were reviewed today:  Echo 03/06/2021: 1. Left ventricular ejection fraction, by estimation, is 55 to 60%. The  left ventricle has normal function. The left ventricle has no regional  wall motion abnormalities. Left ventricular diastolic parameters are  consistent with Grade I diastolic  dysfunction (impaired relaxation).  2. Right ventricular systolic function is normal. The right ventricular  size is normal. There is normal pulmonary artery systolic pressure.   3. The mitral valve is abnormal. Trivial mitral valve regurgitation.   4. The aortic valve is tricuspid. Aortic valve regurgitation is not  visualized.   5. Pulmonic valve: Mild leaflet calcification - no stenosis.   6. The inferior vena cava is normal in size with <50% respiratory  variability, suggesting right atrial pressure of 8 mmHg.  EKG:  EKG's recent electrocardiogram performed in June 2022 demonstrates low voltage with normal sinus rhythm.  Recent Labs: 01/25/2021: TSH 0.150 01/28/2021: Magnesium 1.5 05/17/2021: ALT 32; BUN 9; Creatinine 1.12; Hemoglobin 11.4; Platelet Count 211; Potassium 3.8; Sodium 140  Recent Lipid Panel    Component Value Date/Time   CHOL 254 (H) 05/03/2020 1050   CHOL 272 (H) 03/18/2017 0901   TRIG 91.0 05/03/2020 1050   HDL 63.90 05/03/2020 1050   HDL 64 03/18/2017 0901   CHOLHDL 4 05/03/2020 1050   VLDL 18.2 05/03/2020 1050   LDLCALC 172 (H) 05/03/2020 1050    LDLCALC 191 (H) 03/18/2017 0901    Physical Exam:    VS:  BP 122/80   Pulse 64   Ht 5\' 4"  (1.626 m)   Wt 204 lb 6.4 oz (92.7 kg)   SpO2 97%   BMI 35.09 kg/m     Wt Readings from Last 3 Encounters:  07/05/21 204 lb 6.4 oz (92.7 kg)  06/12/21 204 lb 9.6 oz (92.8 kg)  05/18/21 200 lb 9.6 oz (91 kg)     GEN: Obese. No acute distress HEENT: Normal NECK: No JVD. LYMPHATICS: No lymphadenopathy CARDIAC: No murmur. RRR no gallop, or edema. VASCULAR:  Normal Pulses. No bruits. RESPIRATORY:  Clear to auscultation without rales, wheezing or rhonchi  ABDOMEN: Soft, non-tender, non-distended, No pulsatile mass, MUSCULOSKELETAL: No deformity  SKIN: Warm and dry NEUROLOGIC:  Alert and oriented x 3 PSYCHIATRIC:  Normal affect   ASSESSMENT:    1. Paroxysmal atrial fibrillation (HCC)   2. Primary hypertension   3. Class II obesity   4. Hyperlipidemia LDL goal <70   5. Pre-diabetes   6. Aortic atherosclerosis (HCC)    PLAN:    In order of problems listed above:  Uncertain etiology.  Structurally normal heart by echo.  Occurred in the setting of beta-blocker withdrawal.  Beta-blocker therapy have been longstanding.  A 30-day monitor will be performed.  If no recurrent episodes of atrial fibrillation, will consider discontinuation of anticoagulation.  She will need to have a 14-month follow-up to fully discuss prior to making this decision.  Clearly, if there are recurrent episodes of atrial fibrillation, lifelong anticoagulation will be required. The blood pressure is adequately controlled. Did not discuss other than as a risk factor for recurrent atrial fib Preventive therapy with LDL target less than 70 given prediabetes and aortic atherosclerosis.  We did not discuss this diagnosis but will be sure to do it on the next visit. Noted Primary prevention of vascular events will need to be discussed on next visit in 3 months.   Medication Adjustments/Labs and Tests Ordered: Current  medicines are reviewed at length with the patient today.  Concerns regarding medicines are outlined above.  Orders Placed This Encounter  Procedures   CARDIAC EVENT MONITOR    No orders of the defined types were placed in this encounter.   Patient Instructions  Medication Instructions:  *If you need a refill on your cardiac medications before your  next appointment, please call your pharmacy*  Lab Work: If you have labs (blood work) drawn today and your tests are completely normal, you will receive your results only by: [place] (if you have MyChart) OR A paper copy in the mail If you have any lab test that is abnormal or we need to change your treatment, we will call you to review the results.  Testing/Procedures: Your physician has recommended that you wear an event monitor. Event monitors are medical devices that record the heart's electrical activity. Doctors most often [place] these monitors to diagnose arrhythmias. Arrhythmias are problems with the speed or rhythm of the heartbeat. The monitor is a small, portable device. You can wear one while you do your normal daily activities. This is usually used to diagnose what is causing palpitations/syncope (passing out).  Follow-Up: At [facility], you and your health needs are our priority.  As part of our continuing mission to provide you with exceptional heart care, we have created designated Provider Care Teams.  These Care Teams include your primary Cardiologist (physician) and Advanced Practice Providers (APPs -  Physician Assistants and Nurse Practitioners) who all work together to provide you with the care you need, when you need it.  We recommend signing up for the patient portal called "MyChart".  Sign up information is provided on this After Visit Summary.  MyChart is used to connect with patients for Virtual Visits (Telemedicine).  Patients are able to view lab/test results, encounter notes, upcoming appointments, etc.   Non-urgent messages can be sent to your provider as well.   To learn more about what you can do with MyChart, go to [email or web address].    Your next appointment:   2 month(s)  The format for your next appointment:   In Person  Provider:   You may see Dr. Tamala Julian or one of the following Advanced Practice Providers on your designated Care Team:   Kathyrn Drown, NP   Signed, Sinclair Grooms, MD  07/05/2021 10:30 AM    [place]

## 2021-07-05 ENCOUNTER — Encounter: Payer: Self-pay | Admitting: Interventional Cardiology

## 2021-07-05 ENCOUNTER — Other Ambulatory Visit: Payer: Self-pay

## 2021-07-05 ENCOUNTER — Ambulatory Visit (INDEPENDENT_AMBULATORY_CARE_PROVIDER_SITE_OTHER): Payer: Medicare Other | Admitting: Interventional Cardiology

## 2021-07-05 VITALS — BP 122/80 | HR 64 | Ht 64.0 in | Wt 204.4 lb

## 2021-07-05 DIAGNOSIS — I10 Essential (primary) hypertension: Secondary | ICD-10-CM

## 2021-07-05 DIAGNOSIS — I700 Atherosclerosis of aorta: Secondary | ICD-10-CM

## 2021-07-05 DIAGNOSIS — E669 Obesity, unspecified: Secondary | ICD-10-CM | POA: Diagnosis not present

## 2021-07-05 DIAGNOSIS — E785 Hyperlipidemia, unspecified: Secondary | ICD-10-CM

## 2021-07-05 DIAGNOSIS — I480 Paroxysmal atrial fibrillation: Secondary | ICD-10-CM | POA: Diagnosis not present

## 2021-07-05 DIAGNOSIS — R7303 Prediabetes: Secondary | ICD-10-CM

## 2021-07-05 NOTE — Patient Instructions (Signed)
Medication Instructions:  *If you need a refill on your cardiac medications before your next appointment, please call your pharmacy*  Lab Work: If you have labs (blood work) drawn today and your tests are completely normal, you will receive your results only by: [place] (if you have MyChart) OR A paper copy in the mail If you have any lab test that is abnormal or we need to change your treatment, we will call you to review the results.  Testing/Procedures: Your physician has recommended that you wear an event monitor. Event monitors are medical devices that record the heart's electrical activity. Doctors most often [place] these monitors to diagnose arrhythmias. Arrhythmias are problems with the speed or rhythm of the heartbeat. The monitor is a small, portable device. You can wear one while you do your normal daily activities. This is usually used to diagnose what is causing palpitations/syncope (passing out).  Follow-Up: At [facility], you and your health needs are our priority.  As part of our continuing mission to provide you with exceptional heart care, we have created designated Provider Care Teams.  These Care Teams include your primary Cardiologist (physician) and Advanced Practice Providers (APPs -  Physician Assistants and Nurse Practitioners) who all work together to provide you with the care you need, when you need it.  We recommend signing up for the patient portal called "MyChart".  Sign up information is provided on this After Visit Summary.  MyChart is used to connect with patients for Virtual Visits (Telemedicine).  Patients are able to view lab/test results, encounter notes, upcoming appointments, etc.  Non-urgent messages can be sent to your provider as well.   To learn more about what you can do with MyChart, go to [email or web address].    Your next appointment:   2 month(s)  The format for your next appointment:   In Person  Provider:   You may see Dr. Tamala Julian or  one of the following Advanced Practice Providers on your designated Care Team:   Kathyrn Drown, NP

## 2021-07-06 DIAGNOSIS — H04123 Dry eye syndrome of bilateral lacrimal glands: Secondary | ICD-10-CM | POA: Diagnosis not present

## 2021-07-06 DIAGNOSIS — H35033 Hypertensive retinopathy, bilateral: Secondary | ICD-10-CM | POA: Diagnosis not present

## 2021-07-06 DIAGNOSIS — H524 Presbyopia: Secondary | ICD-10-CM | POA: Diagnosis not present

## 2021-07-06 DIAGNOSIS — H04213 Epiphora due to excess lacrimation, bilateral lacrimal glands: Secondary | ICD-10-CM | POA: Diagnosis not present

## 2021-07-06 DIAGNOSIS — H40013 Open angle with borderline findings, low risk, bilateral: Secondary | ICD-10-CM | POA: Diagnosis not present

## 2021-07-06 DIAGNOSIS — H2513 Age-related nuclear cataract, bilateral: Secondary | ICD-10-CM | POA: Diagnosis not present

## 2021-07-11 ENCOUNTER — Other Ambulatory Visit (HOSPITAL_COMMUNITY): Payer: Self-pay

## 2021-07-11 ENCOUNTER — Other Ambulatory Visit: Payer: Self-pay

## 2021-07-17 ENCOUNTER — Inpatient Hospital Stay: Payer: Medicare Other | Attending: Gynecologic Oncology

## 2021-07-17 ENCOUNTER — Inpatient Hospital Stay (HOSPITAL_BASED_OUTPATIENT_CLINIC_OR_DEPARTMENT_OTHER): Payer: Medicare Other | Admitting: Hematology and Oncology

## 2021-07-17 ENCOUNTER — Telehealth: Payer: Self-pay

## 2021-07-17 ENCOUNTER — Encounter: Payer: Self-pay | Admitting: Hematology and Oncology

## 2021-07-17 ENCOUNTER — Other Ambulatory Visit: Payer: Self-pay

## 2021-07-17 DIAGNOSIS — K5909 Other constipation: Secondary | ICD-10-CM

## 2021-07-17 DIAGNOSIS — Z7189 Other specified counseling: Secondary | ICD-10-CM

## 2021-07-17 DIAGNOSIS — Z79899 Other long term (current) drug therapy: Secondary | ICD-10-CM | POA: Insufficient documentation

## 2021-07-17 DIAGNOSIS — C541 Malignant neoplasm of endometrium: Secondary | ICD-10-CM | POA: Insufficient documentation

## 2021-07-17 DIAGNOSIS — D563 Thalassemia minor: Secondary | ICD-10-CM | POA: Diagnosis not present

## 2021-07-17 LAB — CMP (CANCER CENTER ONLY)
ALT: 41 U/L (ref 0–44)
AST: 58 U/L — ABNORMAL HIGH (ref 15–41)
Albumin: 3.7 g/dL (ref 3.5–5.0)
Alkaline Phosphatase: 47 U/L (ref 38–126)
Anion gap: 10 (ref 5–15)
BUN: 9 mg/dL (ref 8–23)
CO2: 26 mmol/L (ref 22–32)
Calcium: 9 mg/dL (ref 8.9–10.3)
Chloride: 102 mmol/L (ref 98–111)
Creatinine: 1.23 mg/dL — ABNORMAL HIGH (ref 0.44–1.00)
GFR, Estimated: 48 mL/min — ABNORMAL LOW (ref >60)
Glucose, Bld: 92 mg/dL (ref 70–99)
Potassium: 4.1 mmol/L (ref 3.5–5.1)
Sodium: 138 mmol/L (ref 135–145)
Total Bilirubin: 0.6 mg/dL (ref 0.3–1.2)
Total Protein: 7.2 g/dL (ref 6.5–8.1)

## 2021-07-17 LAB — CBC WITH DIFFERENTIAL (CANCER CENTER ONLY)
Abs Immature Granulocytes: 0.04 10*3/uL (ref 0.00–0.07)
Basophils Absolute: 0 10*3/uL (ref 0.0–0.1)
Basophils Relative: 1 %
Eosinophils Absolute: 0 10*3/uL (ref 0.0–0.5)
Eosinophils Relative: 1 %
HCT: 30.6 % — ABNORMAL LOW (ref 36.0–46.0)
Hemoglobin: 10.5 g/dL — ABNORMAL LOW (ref 12.0–15.0)
Immature Granulocytes: 1 %
Lymphocytes Relative: 38 %
Lymphs Abs: 1.4 10*3/uL (ref 0.7–4.0)
MCH: 27.9 pg (ref 26.0–34.0)
MCHC: 34.3 g/dL (ref 30.0–36.0)
MCV: 81.2 fL (ref 80.0–100.0)
Monocytes Absolute: 0.2 10*3/uL (ref 0.1–1.0)
Monocytes Relative: 5 %
Neutro Abs: 2 10*3/uL (ref 1.7–7.7)
Neutrophils Relative %: 54 %
Platelet Count: 197 10*3/uL (ref 150–400)
RBC: 3.77 MIL/uL — ABNORMAL LOW (ref 3.87–5.11)
RDW: 16.9 % — ABNORMAL HIGH (ref 11.5–15.5)
WBC Count: 3.7 10*3/uL — ABNORMAL LOW (ref 4.0–10.5)
nRBC: 0 % (ref 0.0–0.2)

## 2021-07-17 MED ORDER — HEPARIN SOD (PORK) LOCK FLUSH 100 UNIT/ML IV SOLN
500.0000 [IU] | Freq: Once | INTRAVENOUS | Status: AC
  Administered 2021-07-17: 500 [IU]
  Filled 2021-07-17: qty 5

## 2021-07-17 MED ORDER — SODIUM CHLORIDE 0.9% FLUSH
10.0000 mL | Freq: Once | INTRAVENOUS | Status: AC
  Administered 2021-07-17: 10 mL
  Filled 2021-07-17: qty 10

## 2021-07-17 NOTE — Telephone Encounter (Signed)
Called and left below message. Ask her to call the office for questions. ?

## 2021-07-17 NOTE — Telephone Encounter (Signed)
-----   Message from [name], MD sent at 07/17/2021  9:20 AM EDT ----- The rest of her labs are ok except for elevated creatinine Advise more liquids

## 2021-07-17 NOTE — Progress Notes (Signed)
[place] OFFICE PROGRESS NOTE  Patient Care Team: Isaac Bliss, Rayford Halsted, MD as PCP - General (Internal Medicine) Axel Filler, MD  ASSESSMENT & PLAN:  Endometrial cancer [facility]) Her last CT scan showed no signs of residual disease The abnormal lymphadenopathy seen is nonspecific She is not symptomatic Plan to observe only I will see her back in about 8 weeks with repeat blood work and close observation and she is in agreement   Other constipation We discussed the importance of management of constipation She agrees to take laxatives daily.  The goal is to have at least one bowel movement every other day.     Beta thalassemia trait She has thalassemia trait She is not symptomatic Observe only  No orders of the defined types were placed in this encounter.   All questions were answered. The patient knows to call the clinic with any problems, questions or concerns. The total time spent in the appointment was 20 minutes encounter with patients including review of chart and various tests results, discussions about plan of care and coordination of care plan   [name], MD 07/17/2021 9:40 AM  INTERVAL HISTORY: Please see below for problem oriented charting. She returns for follow-up She denies nausea, bloating or abdominal pain She has chronic constipation; only has bowel movement once a week She does not take laxatives regularly  SUMMARY OF ONCOLOGIC HISTORY: Oncology History Overview Note  Poorly differentiated carcinoma, mixed histology with squamous differentiation, rare focus of clear cells as well as serous features MSI stable Her2 negative   Endometrial cancer ([place])  09/01/2019 Initial Diagnosis   The patient reported a history of postmenopausal bleeding that began 1 to 2 months before diagnosis   09/14/2019 Imaging   US pelvis 1. Enlarged uterus with numerous myometrial masses, presumably fibroids. 2. Endometrial thickness of 6.2 mm.  In the setting of post-menopausal bleeding, endometrial sampling is indicated to exclude carcinoma. If results are benign, sonohysterogram should be considered for focal lesion work-up.  3. Nonvisualized ovaries   09/23/2019 Pathology Results   A. ENDOMETRIUM, BIOPSY:  - Poorly differentiated carcinoma   10/15/2019 Imaging   Ct scan of chest, abdomen and pelvis: No evidence of metastatic disease within the chest, abdomen, or pelvis.   Enlarged fibroid uterus.   Colonic diverticulosis. No radiographic evidence of diverticulitis.   Aortic and coronary artery atherosclerosis.     10/22/2019 Pathology Results   SURGICAL PATHOLOGY   FINAL MICROSCOPIC DIAGNOSIS:   A. UTERUS, BILATERAL TUBES AND OVARIES, HYSTERECTOMY:  Poorly differentiated carcinoma, 6.5 cm.  Lymphovascular involvement by tumor.  Carcinoma involves inner half of the myometrium.  Margins not involved.  Cervix, bilateral fallopian tubes and bilateral ovaries free of tumor.   B. LYMPH NODE, RIGHT EXTERNAL SENTINEL, BIOPSY:  One lymph node with no metastatic carcinoma (0/1).   C. LYMPH NODE, RIGHT PELVIC, BIOPSY  Five lymph nodes with no metastatic carcinoma (0/5).   D. LYMPH NODE, LEFT PELVIC, BIOPSY:  Five lymph nodes with no metastatic carcinoma (0/5).   E. LYMPH NODE, RIGHT PERI AORTIC, BIOPSY:  One lymph node with no metastatic carcinoma (0/1).   F. LYMPH NODE, LEFT PERI AORTIC, BIOPSY:  Five lymph nodes with no metastatic carcinoma (0/5).    ONCOLOGY TABLE:  UTERUS, CARCINOMA OR CARCINOSARCOMA   Procedure: Total hysterectomy with bilateral f-oophorectomy and sentinel  lymph nodes.  Histologic type: Poorly differentiated carcinoma, see comment.  Histologic Grade: High-grade, FIGO 3.  Myometrial invasion:  Depth of invasion: 13 mm       Myometrial thickness: 40 mm  Uterine Serosa Involvement: Not identified  Cervical stromal involvement: Not identified  Extent of involvement of other organs:  Not identified  Lymphovascular invasion: Present  Regional Lymph Nodes:       Examined:     17 Sentinel                               0 non-sentinel                               17 total        Lymph nodes with metastasis: 0        Isolated tumor cells (<0.2 mm): 0        Micrometastasis:  (>0.2 mm and < 2.0 mm): 0        Macrometastasis: (>2.0 mm): 0  Representative Tumor Block: A5, A6, A7 and A8.  MMR / MSI testing: Pending  Pathologic Stage Classification (pTNM, AJCC 8th edition):  pT1a, pN0  Comments: The carcinoma is a high-grade poorly differentiated carcinoma which morphologically has predominantly serous features.  There are a few foci with squamous differentiation and a rare focus of clear cell features.  Immunohistochemistry for cytokeratin AE1/AE3 is performed on the sentinel lymph nodes and no positivity is identified.    10/22/2019 Surgery   Pre-operative Diagnosis: endometrial cancer grade 3   Post-operative Diagnosis: same,    Operation: Robotic-assisted laparoscopic total hysterectomy for uterus >250gm with bilateral salpingoophorectomy, SLN mapping, bilateral pelvic and para-aortic lymphadenectomy.   Surgeon: Donaciano Eva    Operative Findings:  : 16cm bulky fibroid uterus, normal ovaries bilaterally, no suspicious lymph nodes.     11/10/2019 Cancer Staging   Staging form: Corpus Uteri - Carcinoma and Carcinosarcoma, AJCC 8th Edition - Pathologic: Stage IVB (pT1a, pN0, cM1) - Signed by [name], MD on 05/16/2020    05/10/2020 Imaging   1. New hypodense 2.0 cm segment 4A left liver lobe mass, suspicious for hepatic metastasis. 2. New left pelvic sidewall 1.4 cm soft tissue nodule, suspicious for left internal iliac nodal metastasis. 3. New left vaginal cuff 1.6 x 1.3 cm soft tissue nodule, suspicious for recurrent tumor. 4. Aortic Atherosclerosis (ICD10-I70.0).   05/18/2020 Procedure   Successful placement of a right internal jugular approach power  injectable Port-A-Cath. The catheter is ready for immediate use.   05/23/2020 -  Chemotherapy   The patient had carboplatin and taxol for chemotherapy treatment.     07/28/2020 Imaging   1. No findings identified to suggest recurrent or metastatic disease. 2. Indeterminate and slightly exophytic lesion arising from the inferior pole of left kidney measures 0.8 cm. Technically this is too small to reliably characterize. Attention on follow-up imaging is advised. 3. Aortic atherosclerosis and LAD coronary artery atherosclerotic calcifications.   11/07/2020 Imaging   1. Interval progression of right pelvic sidewall lymphadenopathy, highly concerning for disease progression. PET-CT may prove helpful to further evaluate as clinically warranted. 2. Interval resolution of the previously identified hypodense lesion in segment IVA of the liver. 3. Stable 7 mm subcapsular lesion in the lower pole left kidney with attenuation higher than would be expected for a simple cyst but is too small to reliably characterize. Attention on follow-up recommended. 4. Aortic Atherosclerosis (ICD10-I70.0).   11/16/2020 - 01/04/2021 Chemotherapy  02/02/2021 Imaging   1. Significant interval decrease in size in right pelvic sidewall, iliac, and inguinal lymph nodes, consistent with treatment response of nodal metastatic disease. 2. Multiple low-attenuation lesions throughout the liver, unchanged compared to prior examination, the majority of these consistent with simple cysts. A previously noted metastatic lesion of anterior hepatic segment VII remains resolved. No new lesions. Findings are consistent with sustained treatment response of hepatic metastatic disease. 3. Status post hysterectomy. Unchanged post treatment appearance of left vaginal cuff and pelvic sidewall soft tissue, consistent with sustained treatment response. 4. No evidence of metastatic disease in the chest.   Aortic Atherosclerosis  (ICD10-I70.0).     05/17/2021 Imaging   Mild right external iliac lymphadenopathy, without significant change. No new or progressive disease within the abdomen or pelvis.   Colonic diverticulosis. No radiographic evidence of diverticulitis.   Aortic Atherosclerosis (ICD10-I70.0).     REVIEW OF SYSTEMS:   Constitutional: Denies fevers, chills or abnormal weight loss Eyes: Denies blurriness of vision Ears, nose, mouth, throat, and face: Denies mucositis or sore throat Respiratory: Denies cough, dyspnea or wheezes Cardiovascular: Denies palpitation, chest discomfort or lower extremity swelling Skin: Denies abnormal skin rashes Lymphatics: Denies new lymphadenopathy or easy bruising Neurological:Denies numbness, tingling or new weaknesses Behavioral/Psych: Mood is stable, no new changes  All other systems were reviewed with the patient and are negative.  I have reviewed the past medical history, past surgical history, social history and family history with the patient and they are unchanged from previous note.  ALLERGIES:  is allergic to benicar hct [olmesartan medoxomil-hctz], iodinated diagnostic agents, bactrim [sulfamethoxazole-trimethoprim], pravastatin, shellfish allergy, and amlodipine.  MEDICATIONS:  Current Outpatient Medications  Medication Sig Dispense Refill   acetaminophen (TYLENOL) 500 MG tablet Take 1,000 mg by mouth every 6 (six) hours as needed for moderate pain.     APPLE CIDER VINEGAR PO Take 5 mLs by mouth daily.      atenolol (TENORMIN) 25 MG tablet TAKE 1 TABLET (25 MG TOTAL) BY MOUTH DAILY. 30 tablet 3   Cholecalciferol (VITAMIN D3) 50 MCG (2000 UT) TABS Take by mouth.     ELIQUIS 5 MG TABS tablet TAKE 1 TABLET BY MOUTH TWICE A DAY 60 tablet 3   loratadine (CLARITIN) 10 MG tablet Take 10 mg by mouth daily as needed for allergies.     Multiple Vitamin (MULTIVITAMIN) capsule Take 1 capsule by mouth daily.     polyethylene glycol (MIRALAX / GLYCOLAX) 17 g packet  Take 17 g by mouth daily as needed for mild constipation.     No current facility-administered medications for this visit.    PHYSICAL EXAMINATION: ECOG PERFORMANCE STATUS: 0 - Asymptomatic  Vitals:   07/17/21 0828  BP: 134/74  Pulse: (!) 58  Resp: 18  Temp: 98.4 F (36.9 C)  SpO2: 99%   Filed Weights   07/17/21 0828  Weight: 206 lb 6.4 oz (93.6 kg)    GENERAL:alert, no distress and comfortable SKIN: skin color, texture, turgor are normal, no rashes or significant lesions EYES: normal, Conjunctiva are pink and non-injected, sclera clear OROPHARYNX:no exudate, no erythema and lips, buccal mucosa, and tongue normal  NECK: supple, thyroid normal size, non-tender, without nodularity LYMPH:  no palpable lymphadenopathy in the cervical, axillary or inguinal LUNGS: clear to auscultation and percussion with normal breathing effort HEART: regular rate & rhythm and no murmurs and no lower extremity edema ABDOMEN:abdomen soft, non-tender and normal bowel sounds Musculoskeletal:no cyanosis of digits and no clubbing  NEURO: alert &  oriented x 3 with fluent speech, no focal motor/sensory deficits  LABORATORY DATA:  I have reviewed the data as listed    Component Value Date/Time   NA 138 07/17/2021 0741   NA 140 07/04/2017 0952   K 4.1 07/17/2021 0741   CL 102 07/17/2021 0741   CO2 26 07/17/2021 0741   GLUCOSE 92 07/17/2021 0741   BUN 9 07/17/2021 0741   BUN 10 07/04/2017 0952   CREATININE 1.23 (H) 07/17/2021 0741   CREATININE 0.81 03/23/2015 0946   CALCIUM 9.0 07/17/2021 0741   PROT 7.2 07/17/2021 0741   ALBUMIN 3.7 07/17/2021 0741   AST 58 (H) 07/17/2021 0741   ALT 41 07/17/2021 0741   ALKPHOS 47 07/17/2021 0741   BILITOT 0.6 07/17/2021 0741   GFRNONAA 48 (L) 07/17/2021 0741   GFRNONAA 78 03/23/2015 0946   GFRAA >60 09/12/2020 0853   GFRAA >89 03/23/2015 0946    No results found for: SPEP, UPEP  Lab Results  Component Value Date   WBC 3.7 (L) 07/17/2021    NEUTROABS 2.0 07/17/2021   HGB 10.5 (L) 07/17/2021   HCT 30.6 (L) 07/17/2021   MCV 81.2 07/17/2021   PLT 197 07/17/2021      Chemistry      Component Value Date/Time   NA 138 07/17/2021 0741   NA 140 07/04/2017 0952   K 4.1 07/17/2021 0741   CL 102 07/17/2021 0741   CO2 26 07/17/2021 0741   BUN 9 07/17/2021 0741   BUN 10 07/04/2017 0952   CREATININE 1.23 (H) 07/17/2021 0741   CREATININE 0.81 03/23/2015 0946      Component Value Date/Time   CALCIUM 9.0 07/17/2021 0741   ALKPHOS 47 07/17/2021 0741   AST 58 (H) 07/17/2021 0741   ALT 41 07/17/2021 0741   BILITOT 0.6 07/17/2021 0741

## 2021-07-17 NOTE — Assessment & Plan Note (Signed)
We discussed the importance of management of constipation She agrees to take laxatives daily.  The goal is to have at least one bowel movement every other day.

## 2021-07-17 NOTE — Assessment & Plan Note (Signed)
She has thalassemia trait She is not symptomatic Observe only 

## 2021-07-17 NOTE — Assessment & Plan Note (Signed)
Her last CT scan showed no signs of residual disease The abnormal lymphadenopathy seen is nonspecific She is not symptomatic Plan to observe only I will see her back in about 8 weeks with repeat blood work and close observation and she is in agreement

## 2021-07-18 ENCOUNTER — Telehealth: Payer: Self-pay | Admitting: Hematology and Oncology

## 2021-07-18 NOTE — Telephone Encounter (Signed)
Scheduled per 7/18 sch msg. Called pt and left a msg

## 2021-07-24 ENCOUNTER — Ambulatory Visit: Payer: Medicare Other | Attending: Critical Care Medicine

## 2021-07-24 DIAGNOSIS — Z20822 Contact with and (suspected) exposure to covid-19: Secondary | ICD-10-CM | POA: Diagnosis not present

## 2021-07-25 LAB — NOVEL CORONAVIRUS, NAA: SARS-CoV-2, NAA: NOT DETECTED

## 2021-07-25 LAB — SARS-COV-2, NAA 2 DAY TAT

## 2021-07-25 LAB — SPECIMEN STATUS REPORT

## 2021-08-01 ENCOUNTER — Telehealth: Payer: Self-pay | Admitting: Internal Medicine

## 2021-08-01 ENCOUNTER — Telehealth: Payer: Self-pay | Admitting: *Deleted

## 2021-08-01 NOTE — Telephone Encounter (Signed)
PT called to advise that she would like to find out if Dr.Hernandez is offering the [employer] shot.

## 2021-08-01 NOTE — Telephone Encounter (Signed)
Patient left voice mail inquiring if [facility] is offering [place].  Contacted patient in response to message.  Patient stated she decided to go to [facility] today and get Booster, so she did not need to come to [facility] for it.   Advised patient that immunization record will be updated.

## 2021-08-02 NOTE — Telephone Encounter (Signed)
Left detailed message on machine for patient to contact her pharmacy for a Covid Booster.

## 2021-08-17 ENCOUNTER — Encounter: Payer: Self-pay | Admitting: Internal Medicine

## 2021-08-17 ENCOUNTER — Ambulatory Visit (INDEPENDENT_AMBULATORY_CARE_PROVIDER_SITE_OTHER): Payer: Medicare Other | Admitting: Internal Medicine

## 2021-08-17 ENCOUNTER — Other Ambulatory Visit: Payer: Self-pay

## 2021-08-17 VITALS — BP 136/86 | HR 64 | Temp 97.8°F | Resp 16 | Ht 64.0 in | Wt 202.0 lb

## 2021-08-17 DIAGNOSIS — H1010 Acute atopic conjunctivitis, unspecified eye: Secondary | ICD-10-CM | POA: Diagnosis not present

## 2021-08-17 DIAGNOSIS — I480 Paroxysmal atrial fibrillation: Secondary | ICD-10-CM | POA: Diagnosis not present

## 2021-08-17 DIAGNOSIS — E039 Hypothyroidism, unspecified: Secondary | ICD-10-CM

## 2021-08-17 DIAGNOSIS — Z0001 Encounter for general adult medical examination with abnormal findings: Secondary | ICD-10-CM

## 2021-08-17 DIAGNOSIS — D539 Nutritional anemia, unspecified: Secondary | ICD-10-CM | POA: Diagnosis not present

## 2021-08-17 DIAGNOSIS — J370 Chronic laryngitis: Secondary | ICD-10-CM | POA: Diagnosis not present

## 2021-08-17 DIAGNOSIS — R7303 Prediabetes: Secondary | ICD-10-CM

## 2021-08-17 DIAGNOSIS — Z1231 Encounter for screening mammogram for malignant neoplasm of breast: Secondary | ICD-10-CM

## 2021-08-17 DIAGNOSIS — E785 Hyperlipidemia, unspecified: Secondary | ICD-10-CM | POA: Diagnosis not present

## 2021-08-17 DIAGNOSIS — D6869 Other thrombophilia: Secondary | ICD-10-CM

## 2021-08-17 DIAGNOSIS — I10 Essential (primary) hypertension: Secondary | ICD-10-CM

## 2021-08-17 DIAGNOSIS — J309 Allergic rhinitis, unspecified: Secondary | ICD-10-CM | POA: Diagnosis not present

## 2021-08-17 LAB — VITAMIN B12: Vitamin B-12: >1550 pg/mL — ABNORMAL HIGH (ref 211–911)

## 2021-08-17 LAB — HEPATIC FUNCTION PANEL
ALT: 34 U/L (ref 0–35)
AST: 50 U/L — ABNORMAL HIGH (ref 0–37)
Albumin: 4.1 g/dL (ref 3.5–5.2)
Alkaline Phosphatase: 43 U/L (ref 39–117)
Bilirubin, Direct: 0.1 mg/dL (ref 0.0–0.3)
Total Bilirubin: 0.7 mg/dL (ref 0.2–1.2)
Total Protein: 7.3 g/dL (ref 6.0–8.3)

## 2021-08-17 LAB — LIPID PANEL
Cholesterol: 691 mg/dL — ABNORMAL HIGH (ref 0–200)
HDL: 85.9 mg/dL (ref >39.00)
LDL Cholesterol: 573 mg/dL — ABNORMAL HIGH (ref 0–99)
NonHDL: 605.31
Total CHOL/HDL Ratio: 8
Triglycerides: 163 mg/dL — ABNORMAL HIGH (ref 0.0–149.0)
VLDL: 32.6 mg/dL (ref 0.0–40.0)

## 2021-08-17 LAB — IRON: Iron: 58 ug/dL (ref 42–145)

## 2021-08-17 LAB — TSH: TSH: 116.69 u[IU]/mL — ABNORMAL HIGH (ref 0.35–5.50)

## 2021-08-17 LAB — HEMOGLOBIN A1C: Hgb A1c MFr Bld: 5.8 % (ref 4.6–6.5)

## 2021-08-17 LAB — FOLATE: Folate: 11.5 ng/mL (ref >5.9)

## 2021-08-17 LAB — IBC + FERRITIN
Ferritin: 166.7 ng/mL (ref 10.0–291.0)
Iron: 58 ug/dL (ref 42–145)
Saturation Ratios: 16.4 % — ABNORMAL LOW (ref 20.0–50.0)
TIBC: 352.8 ug/dL (ref 250.0–450.0)
Transferrin: 252 mg/dL (ref 212.0–360.0)

## 2021-08-17 MED ORDER — APIXABAN 5 MG PO TABS: 5.0000 mg | ORAL_TABLET | Freq: Two times a day (BID) | ORAL | 1 refills | Status: DC

## 2021-08-17 MED ORDER — ATENOLOL 25 MG PO TABS: 25.0000 mg | ORAL_TABLET | Freq: Every day | ORAL | 1 refills | Status: DC

## 2021-08-17 MED ORDER — LEVOCETIRIZINE DIHYDROCHLORIDE 5 MG PO TABS: 5.0000 mg | ORAL_TABLET | Freq: Every evening | ORAL | 1 refills | Status: DC

## 2021-08-17 MED ORDER — LEVOTHYROXINE SODIUM 200 MCG PO TABS: 200.0000 ug | ORAL_TABLET | Freq: Every day | ORAL | 0 refills | Status: DC

## 2021-08-17 NOTE — Progress Notes (Signed)
Subjective:  Patient ID: Tina Stone, female    DOB: August 03, 1950  Age: 71 y.o. MRN: [id number]  CC: Atrial Fibrillation, Hypertension, Hyperlipidemia, Annual Exam, and Anemia  This visit occurred during the SARS-CoV-2 public health emergency.  Safety protocols were in place, including screening questions prior to the visit, additional usage of staff PPE, and extensive cleaning of exam room while observing appropriate contact time as indicated for disinfecting solutions.    HPI Tina Stone presents for a CPX and to establish.  She complains of fatigue.  For the last 2 months she has had laryngitis, postnasal drip, itchy watery eyes, and slurred speech.  She denies headache, blurred vision, chest pain, shortness of breath, diaphoresis, palpitations, dizziness, or lightheadedness.  Outpatient Medications Prior to Visit  Medication Sig Dispense Refill   acetaminophen (TYLENOL) 500 MG tablet Take 1,000 mg by mouth every 6 (six) hours as needed for moderate pain.     APPLE CIDER VINEGAR PO Take 5 mLs by mouth daily.      Cholecalciferol (VITAMIN D3) 50 MCG (2000 UT) TABS Take by mouth.     Multiple Vitamin (MULTIVITAMIN) capsule Take 1 capsule by mouth daily.     polyethylene glycol (MIRALAX / GLYCOLAX) 17 g packet Take 17 g by mouth daily as needed for mild constipation.     atenolol (TENORMIN) 25 MG tablet TAKE 1 TABLET (25 MG TOTAL) BY MOUTH DAILY. 30 tablet 3   ELIQUIS 5 MG TABS tablet TAKE 1 TABLET BY MOUTH TWICE A DAY 60 tablet 3   loratadine (CLARITIN) 10 MG tablet Take 10 mg by mouth daily as needed for allergies.     No facility-administered medications prior to visit.    ROS Review of Systems  Constitutional:  Positive for fatigue. Negative for diaphoresis and unexpected weight change.  HENT:  Positive for congestion, postnasal drip, rhinorrhea and voice change. Negative for facial swelling, nosebleeds, sinus pressure, sinus pain, sneezing, sore throat and trouble  swallowing.   Eyes: Negative.   Respiratory:  Negative for cough, chest tightness, shortness of breath and wheezing.   Cardiovascular:  Negative for chest pain, palpitations and leg swelling.  Gastrointestinal:  Positive for constipation. Negative for abdominal pain, blood in stool, diarrhea, nausea and vomiting.  Endocrine: Negative.  Negative for cold intolerance and heat intolerance.  Genitourinary: Negative.  Negative for difficulty urinating.  Musculoskeletal: Negative.   Skin: Negative.   Neurological:  Positive for speech difficulty. Negative for dizziness, weakness, light-headedness and numbness.  Hematological:  Negative for adenopathy. Does not bruise/bleed easily.  Psychiatric/Behavioral: Negative.     Objective:  BP 136/86 (BP Location: Left Arm, Patient Position: Sitting, Cuff Size: Large)   Pulse 64   Temp 97.8 F (36.6 C) (Oral)   Resp 16   Ht '5\' 4"'$  (1.626 m)   Wt 202 lb (91.6 kg)   SpO2 97%   BMI 34.67 kg/m   BP Readings from Last 3 Encounters:  08/17/21 136/86  07/17/21 134/74  07/05/21 122/80    Wt Readings from Last 3 Encounters:  08/17/21 202 lb (91.6 kg)  07/17/21 206 lb 6.4 oz (93.6 kg)  07/05/21 204 lb 6.4 oz (92.7 kg)    Physical Exam Vitals reviewed.  Constitutional:      Appearance: Normal appearance.  HENT:     Nose: Nose normal.     Mouth/Throat:     Mouth: Mucous membranes are moist.  Eyes:     Conjunctiva/sclera: Conjunctivae normal.  Cardiovascular:  Rate and Rhythm: Normal rate and regular rhythm.     Heart sounds: No murmur heard. Pulmonary:     Effort: Pulmonary effort is normal.     Breath sounds: No stridor. No wheezing, rhonchi or rales.  Abdominal:     General: Abdomen is protuberant. Bowel sounds are normal. There is no distension.     Palpations: Abdomen is soft. There is no hepatomegaly, splenomegaly or mass.     Tenderness: There is no abdominal tenderness.  Musculoskeletal:        General: Normal range of motion.      Cervical back: Neck supple.     Right lower leg: No edema.     Left lower leg: No edema.  Lymphadenopathy:     Cervical: No cervical adenopathy.  Skin:    General: Skin is warm and dry.     Findings: No rash.  Neurological:     General: No focal deficit present.     Mental Status: She is alert.  Psychiatric:        Mood and Affect: Mood normal.        Behavior: Behavior normal.    Lab Results  Component Value Date   WBC 3.7 (L) 07/17/2021   HGB 10.5 (L) 07/17/2021   HCT 30.6 (L) 07/17/2021   PLT 197 07/17/2021   GLUCOSE 92 07/17/2021   CHOL 691 (H) 08/17/2021   TRIG 163.0 (H) 08/17/2021   HDL 85.90 08/17/2021   LDLCALC 573 (H) 08/17/2021   ALT 34 08/17/2021   AST 50 (H) 08/17/2021   NA 138 07/17/2021   K 4.1 07/17/2021   CL 102 07/17/2021   CREATININE 1.23 (H) 07/17/2021   BUN 9 07/17/2021   CO2 26 07/17/2021   TSH 116.69 (H) 08/17/2021   INR 1.0 01/28/2021   HGBA1C 5.8 08/17/2021    No results found.  Assessment & Plan:   Tina Stone was seen today for atrial fibrillation, hypertension, hyperlipidemia, annual exam and anemia.  Diagnoses and all orders for this visit:  Essential hypertension- Her blood pressure is adequately well controlled. -     Cancel: Basic metabolic panel; Future -     TSH; Future -     atenolol (TENORMIN) 25 MG tablet; Take 1 tablet (25 mg total) by mouth daily. -     TSH  Prediabetes- Her A1c is normal. -     Cancel: Basic metabolic panel; Future -     Hemoglobin A1c; Future -     Hemoglobin A1c  Hyperlipidemia with target LDL less than 100- Her LDL is extremely high but this is caused by the hypothyroidism. -     Lipid panel; Future -     TSH; Future -     Hepatic function panel; Future -     Hepatic function panel -     TSH -     Lipid panel  Deficiency anemia- I will screen her for vitamin deficiencies. -     Cancel: CBC with Differential/Platelet; Future -     Vitamin B12; Future -     IBC + Ferritin; Future -      Iron; Future -     Vitamin B1; Future -     Folate; Future -     Folate -     Vitamin B1 -     Iron -     IBC + Ferritin -     Vitamin B12  Paroxysmal atrial fibrillation ([place])- She has good rate and rhythm  control.  Will continue the DOAC and atenolol. -     TSH; Future -     apixaban (ELIQUIS) 5 MG TABS tablet; Take 1 tablet (5 mg total) by mouth 2 (two) times daily. -     atenolol (TENORMIN) 25 MG tablet; Take 1 tablet (25 mg total) by mouth daily. -     TSH  Laryngitis, chronic -     Ambulatory referral to ENT  Visit for screening mammogram -     MM DIGITAL SCREENING BILATERAL; Future  Secondary hypercoagulable state ([place]) -     apixaban (ELIQUIS) 5 MG TABS tablet; Take 1 tablet (5 mg total) by mouth 2 (two) times daily.  Allergic rhinoconjunctivitis -     levocetirizine (XYZAL) 5 MG tablet; Take 1 tablet (5 mg total) by mouth every evening.  Acquired hypothyroidism-I think this explains her symptoms.  I recommended that she start taking levothyroxine. -     levothyroxine (SYNTHROID) 200 MCG tablet; Take 1 tablet (200 mcg total) by mouth daily.  I have discontinued Carlos Hilt. Pruden's loratadine. I have also changed her Eliquis to apixaban. Additionally, I am having her start on levocetirizine and levothyroxine. Lastly, I am having her maintain her APPLE CIDER VINEGAR PO, multivitamin, acetaminophen, polyethylene glycol, Vitamin D3, and atenolol.  Meds ordered this encounter  Medications   apixaban (ELIQUIS) 5 MG TABS tablet    Sig: Take 1 tablet (5 mg total) by mouth 2 (two) times daily.    Dispense:  180 tablet    Refill:  1   atenolol (TENORMIN) 25 MG tablet    Sig: Take 1 tablet (25 mg total) by mouth daily.    Dispense:  90 tablet    Refill:  1   levocetirizine (XYZAL) 5 MG tablet    Sig: Take 1 tablet (5 mg total) by mouth every evening.    Dispense:  90 tablet    Refill:  1   levothyroxine (SYNTHROID) 200 MCG tablet    Sig: Take 1 tablet (200 mcg total) by  mouth daily.    Dispense:  90 tablet    Refill:  0      Follow-up: Return in about 6 months (around 02/17/2022).  Scarlette Calico, MD

## 2021-08-17 NOTE — Patient Instructions (Signed)
Health Maintenance, Female Adopting a healthy lifestyle and getting preventive care are important in promoting health and wellness. Ask your health care provider about: The right schedule for you to have regular tests and exams. Things you can do on your own to prevent diseases and keep yourself healthy. What should I know about diet, weight, and exercise? Eat a healthy diet  Eat a diet that includes plenty of vegetables, fruits, low-fat dairy products, and lean protein. Do not eat a lot of foods that are high in solid fats, added sugars, or sodium.  Maintain a healthy weight Body mass index (BMI) is used to identify weight problems. It estimates body fat based on height and weight. Your health care provider can help determineyour BMI and help you achieve or maintain a healthy weight. Get regular exercise Get regular exercise. This is one of the most important things you can do for your health. Most adults should: Exercise for at least 150 minutes each week. The exercise should increase your heart rate and make you sweat (moderate-intensity exercise). Do strengthening exercises at least twice a week. This is in addition to the moderate-intensity exercise. Spend less time sitting. Even light physical activity can be beneficial. Watch cholesterol and blood lipids Have your blood tested for lipids and cholesterol at 69 years of age, then havethis test every 5 years. Have your cholesterol levels checked more often if: Your lipid or cholesterol levels are high. You are older than 69 years of age. You are at high risk for heart disease. What should I know about cancer screening? Depending on your health history and family history, you may need to have cancer screening at various ages. This may include screening for: Breast cancer. Cervical cancer. Colorectal cancer. Skin cancer. Lung cancer. What should I know about heart disease, diabetes, and high blood pressure? Blood pressure and heart  disease High blood pressure causes heart disease and increases the risk of stroke. This is more likely to develop in people who have high blood pressure readings, are of African descent, or are overweight. Have your blood pressure checked: Every 3-5 years if you are 18-39 years of age. Every year if you are 40 years old or older. Diabetes Have regular diabetes screenings. This checks your fasting blood sugar level. Have the screening done: Once every three years after age 40 if you are at a normal weight and have a low risk for diabetes. More often and at a younger age if you are overweight or have a high risk for diabetes. What should I know about preventing infection? Hepatitis B If you have a higher risk for hepatitis B, you should be screened for this virus. Talk with your health care provider to find out if you are at risk forhepatitis B infection. Hepatitis C Testing is recommended for: Everyone born from 1945 through 1965. Anyone with known risk factors for hepatitis C. Sexually transmitted infections (STIs) Get screened for STIs, including gonorrhea and chlamydia, if: You are sexually active and are younger than 69 years of age. You are older than 69 years of age and your health care provider tells you that you are at risk for this type of infection. Your sexual activity has changed since you were last screened, and you are at increased risk for chlamydia or gonorrhea. Ask your health care provider if you are at risk. Ask your health care provider about whether you are at high risk for HIV. Your health care provider may recommend a prescription medicine to help   prevent HIV infection. If you choose to take medicine to prevent HIV, you should first get tested for HIV. You should then be tested every 3 months for as long as you are taking the medicine. Pregnancy If you are about to stop having your period (premenopausal) and you may become pregnant, seek counseling before you get  pregnant. Take 400 to 800 micrograms (mcg) of folic acid every day if you become pregnant. Ask for birth control (contraception) if you want to prevent pregnancy. Osteoporosis and menopause Osteoporosis is a disease in which the bones lose minerals and strength with aging. This can result in bone fractures. If you are 65 years old or older, or if you are at risk for osteoporosis and fractures, ask your health care provider if you should: Be screened for bone loss. Take a calcium or vitamin D supplement to lower your risk of fractures. Be given hormone replacement therapy (HRT) to treat symptoms of menopause. Follow these instructions at home: Lifestyle Do not use any products that contain nicotine or tobacco, such as cigarettes, e-cigarettes, and chewing tobacco. If you need help quitting, ask your health care provider. Do not use street drugs. Do not share needles. Ask your health care provider for help if you need support or information about quitting drugs. Alcohol use Do not drink alcohol if: Your health care provider tells you not to drink. You are pregnant, may be pregnant, or are planning to become pregnant. If you drink alcohol: Limit how much you use to 0-1 drink a day. Limit intake if you are breastfeeding. Be aware of how much alcohol is in your drink. In the [place] one drink equals one 12 oz bottle of beer (355 mL), one 5 oz glass of wine (148 mL), or one 1 oz glass of hard liquor (44 mL). General instructions Schedule regular health, dental, and eye exams. Stay current with your vaccines. Tell your health care provider if: You often feel depressed. You have ever been abused or do not feel safe at home. Summary Adopting a healthy lifestyle and getting preventive care are important in promoting health and wellness. Follow your health care provider's instructions about healthy diet, exercising, and getting tested or screened for diseases. Follow your health care provider's  instructions on monitoring your cholesterol and blood pressure. This information is not intended to replace advice given to you by your health care provider. Make sure you discuss any questions you have with your healthcare provider. Document Revised: 12/10/2018 Document Reviewed: 12/10/2018 Elsevier Patient Education  2022 [employer]  

## 2021-08-18 ENCOUNTER — Telehealth: Payer: Self-pay | Admitting: Internal Medicine

## 2021-08-18 DIAGNOSIS — Z0001 Encounter for general adult medical examination with abnormal findings: Secondary | ICD-10-CM | POA: Insufficient documentation

## 2021-08-18 DIAGNOSIS — J309 Allergic rhinitis, unspecified: Secondary | ICD-10-CM | POA: Insufficient documentation

## 2021-08-18 DIAGNOSIS — H1010 Acute atopic conjunctivitis, unspecified eye: Secondary | ICD-10-CM | POA: Insufficient documentation

## 2021-08-18 DIAGNOSIS — Z1231 Encounter for screening mammogram for malignant neoplasm of breast: Secondary | ICD-10-CM | POA: Insufficient documentation

## 2021-08-18 DIAGNOSIS — E039 Hypothyroidism, unspecified: Secondary | ICD-10-CM | POA: Insufficient documentation

## 2021-08-18 NOTE — Telephone Encounter (Signed)
Patient would like someone to give her a call regarding lab results.

## 2021-08-18 NOTE — Assessment & Plan Note (Signed)
Exam completed Labs reviewed Vaccines reviewed and updated Cancer screenings addressed Patient education was given. 

## 2021-08-21 ENCOUNTER — Other Ambulatory Visit: Payer: Self-pay

## 2021-08-21 ENCOUNTER — Ambulatory Visit
Admission: RE | Admit: 2021-08-21 | Discharge: 2021-08-21 | Disposition: A | Discharge status: Home / Self Care | Payer: Medicare Other | Source: Ambulatory Visit | Attending: Internal Medicine | Admitting: Internal Medicine

## 2021-08-21 DIAGNOSIS — Z1231 Encounter for screening mammogram for malignant neoplasm of breast: Secondary | ICD-10-CM | POA: Diagnosis not present

## 2021-08-21 NOTE — Telephone Encounter (Signed)
Spoke with patient regarding result note on TSH and provided clarification. Patient would like to know the results of other labs.

## 2021-08-24 LAB — VITAMIN B1: Vitamin B1 (Thiamine): 22 nmol/L (ref 8–30)

## 2021-08-24 NOTE — Telephone Encounter (Signed)
Left message for patient to call me back. 

## 2021-09-11 ENCOUNTER — Inpatient Hospital Stay (HOSPITAL_BASED_OUTPATIENT_CLINIC_OR_DEPARTMENT_OTHER): Payer: Medicare Other | Admitting: Hematology and Oncology

## 2021-09-11 ENCOUNTER — Inpatient Hospital Stay: Payer: Medicare Other | Attending: Gynecologic Oncology

## 2021-09-11 ENCOUNTER — Other Ambulatory Visit: Payer: Self-pay

## 2021-09-11 ENCOUNTER — Encounter: Payer: Self-pay | Admitting: Hematology and Oncology

## 2021-09-11 VITALS — BP 137/79 | HR 67 | Temp 98.7°F | Resp 18 | Ht 64.0 in | Wt 199.6 lb

## 2021-09-11 DIAGNOSIS — C541 Malignant neoplasm of endometrium: Secondary | ICD-10-CM

## 2021-09-11 DIAGNOSIS — D563 Thalassemia minor: Secondary | ICD-10-CM

## 2021-09-11 DIAGNOSIS — C787 Secondary malignant neoplasm of liver and intrahepatic bile duct: Secondary | ICD-10-CM

## 2021-09-11 DIAGNOSIS — Z79899 Other long term (current) drug therapy: Secondary | ICD-10-CM | POA: Insufficient documentation

## 2021-09-11 DIAGNOSIS — R591 Generalized enlarged lymph nodes: Secondary | ICD-10-CM | POA: Insufficient documentation

## 2021-09-11 DIAGNOSIS — Z7901 Long term (current) use of anticoagulants: Secondary | ICD-10-CM | POA: Insufficient documentation

## 2021-09-11 DIAGNOSIS — Z7189 Other specified counseling: Secondary | ICD-10-CM

## 2021-09-11 LAB — CMP (CANCER CENTER ONLY)
ALT: 13 U/L (ref 0–44)
AST: 19 U/L (ref 15–41)
Albumin: 3.9 g/dL (ref 3.5–5.0)
Alkaline Phosphatase: 66 U/L (ref 38–126)
Anion gap: 9 (ref 5–15)
BUN: 11 mg/dL (ref 8–23)
CO2: 25 mmol/L (ref 22–32)
Calcium: 9.5 mg/dL (ref 8.9–10.3)
Chloride: 104 mmol/L (ref 98–111)
Creatinine: 0.93 mg/dL (ref 0.44–1.00)
GFR, Estimated: >60 mL/min (ref >60)
Glucose, Bld: 96 mg/dL (ref 70–99)
Potassium: 4.2 mmol/L (ref 3.5–5.1)
Sodium: 138 mmol/L (ref 135–145)
Total Bilirubin: 0.5 mg/dL (ref 0.3–1.2)
Total Protein: 7.1 g/dL (ref 6.5–8.1)

## 2021-09-11 LAB — CBC WITH DIFFERENTIAL (CANCER CENTER ONLY)
Abs Immature Granulocytes: 0.02 10*3/uL (ref 0.00–0.07)
Basophils Absolute: 0 10*3/uL (ref 0.0–0.1)
Basophils Relative: 1 %
Eosinophils Absolute: 0.1 10*3/uL (ref 0.0–0.5)
Eosinophils Relative: 2 %
HCT: 28.4 % — ABNORMAL LOW (ref 36.0–46.0)
Hemoglobin: 9.3 g/dL — ABNORMAL LOW (ref 12.0–15.0)
Immature Granulocytes: 1 %
Lymphocytes Relative: 24 %
Lymphs Abs: 1 10*3/uL (ref 0.7–4.0)
MCH: 27.8 pg (ref 26.0–34.0)
MCHC: 32.7 g/dL (ref 30.0–36.0)
MCV: 85 fL (ref 80.0–100.0)
Monocytes Absolute: 0.4 10*3/uL (ref 0.1–1.0)
Monocytes Relative: 9 %
Neutro Abs: 2.6 10*3/uL (ref 1.7–7.7)
Neutrophils Relative %: 63 %
Platelet Count: 196 10*3/uL (ref 150–400)
RBC: 3.34 MIL/uL — ABNORMAL LOW (ref 3.87–5.11)
RDW: 16.1 % — ABNORMAL HIGH (ref 11.5–15.5)
WBC Count: 4.1 10*3/uL (ref 4.0–10.5)
nRBC: 0 % (ref 0.0–0.2)

## 2021-09-11 MED ORDER — SODIUM CHLORIDE 0.9% FLUSH
10.0000 mL | Freq: Once | INTRAVENOUS | Status: AC
  Administered 2021-09-11: 10 mL

## 2021-09-11 MED ORDER — HEPARIN SOD (PORK) LOCK FLUSH 100 UNIT/ML IV SOLN
500.0000 [IU] | Freq: Once | INTRAVENOUS | Status: AC
  Administered 2021-09-11: 500 [IU]

## 2021-09-11 NOTE — Assessment & Plan Note (Signed)
She had history of metastatic disease to the liver Her liver enzymes are normal Observe closely

## 2021-09-11 NOTE — Assessment & Plan Note (Signed)
She has thalassemia trait She is not symptomatic Observe only 

## 2021-09-11 NOTE — Progress Notes (Signed)
[place] OFFICE PROGRESS NOTE  Patient Care Team: [name], MD as PCP - General (Internal Medicine) Axel Filler, MD  ASSESSMENT & PLAN:  Endometrial cancer [facility]) Her last CT scan showed no signs of residual disease The abnormal lymphadenopathy seen is nonspecific She is not symptomatic Plan to observe only I recommend repeat imaging study in November for further follow-up and she is in agreement Previously, despite premedication of steroids and Benadryl prior to imaging study, she continues to have reaction to contrast I will proceed to order CT imaging without IV contrast.  We will do oral contrast only  Beta thalassemia trait She has thalassemia trait She is not symptomatic Observe only  Metastasis to liver [facility]) She had history of metastatic disease to the liver Her liver enzymes are normal Observe closely  Orders Placed This Encounter  Procedures   CT Abdomen Pelvis Wo Contrast    Standing Status:   Future    Standing Expiration Date:   09/11/2022    Order Specific Question:   Preferred imaging location?    Answer:   [facility]    Order Specific Question:   Release to patient    Answer:   Immediate    Order Specific Question:   Is Oral Contrast requested for this exam?    Answer:   Yes, Per Radiology protocol    All questions were answered. The patient knows to call the clinic with any problems, questions or concerns. The total time spent in the appointment was 20 minutes encounter with patients including review of chart and various tests results, discussions about plan of care and coordination of care plan   [name], MD 09/11/2021 12:40 PM  INTERVAL HISTORY: Please see below for problem oriented charting. she returns for follow-up on recurrent uterine cancer She is currently off treatment She has occasional intermittent abdominal pain before bowel movement but usually is resolved after she had a bowel  movement Denies bleeding complications from anticoagulation therapy Denies nausea, abdominal bloating or vaginal bleeding  REVIEW OF SYSTEMS:   Constitutional: Denies fevers, chills or abnormal weight loss Eyes: Denies blurriness of vision Ears, nose, mouth, throat, and face: Denies mucositis or sore throat Respiratory: Denies cough, dyspnea or wheezes Cardiovascular: Denies palpitation, chest discomfort or lower extremity swelling Gastrointestinal:  Denies nausea, heartburn or change in bowel habits Skin: Denies abnormal skin rashes Lymphatics: Denies new lymphadenopathy or easy bruising Neurological:Denies numbness, tingling or new weaknesses Behavioral/Psych: Mood is stable, no new changes  All other systems were reviewed with the patient and are negative.  I have reviewed the past medical history, past surgical history, social history and family history with the patient and they are unchanged from previous note.  ALLERGIES:  is allergic to benicar hct [olmesartan medoxomil-hctz], iodinated diagnostic agents, bactrim [sulfamethoxazole-trimethoprim], pravastatin, shellfish allergy, and amlodipine.  MEDICATIONS:  Current Outpatient Medications  Medication Sig Dispense Refill   acetaminophen (TYLENOL) 500 MG tablet Take 1,000 mg by mouth every 6 (six) hours as needed for moderate pain.     apixaban (ELIQUIS) 5 MG TABS tablet Take 1 tablet (5 mg total) by mouth 2 (two) times daily. 180 tablet 1   APPLE CIDER VINEGAR PO Take 5 mLs by mouth daily.      atenolol (TENORMIN) 25 MG tablet Take 1 tablet (25 mg total) by mouth daily. 90 tablet 1   Cholecalciferol (VITAMIN D3) 50 MCG (2000 UT) TABS Take by mouth.     levocetirizine (XYZAL) 5  MG tablet Take 1 tablet (5 mg total) by mouth every evening. 90 tablet 1   levothyroxine (SYNTHROID) 200 MCG tablet Take 1 tablet (200 mcg total) by mouth daily. 90 tablet 0   Multiple Vitamin (MULTIVITAMIN) capsule Take 1 capsule by mouth daily.      polyethylene glycol (MIRALAX / GLYCOLAX) 17 g packet Take 17 g by mouth daily as needed for mild constipation.     No current facility-administered medications for this visit.    SUMMARY OF ONCOLOGIC HISTORY: Oncology History Overview Note  Poorly differentiated carcinoma, mixed histology with squamous differentiation, rare focus of clear cells as well as serous features MSI stable Her2 negative   Endometrial cancer ([place])  09/01/2019 Initial Diagnosis   The patient reported a history of postmenopausal bleeding that began 1 to 2 months before diagnosis   09/14/2019 Imaging   US pelvis 1. Enlarged uterus with numerous myometrial masses, presumably fibroids. 2. Endometrial thickness of 6.2 mm. In the setting of post-menopausal bleeding, endometrial sampling is indicated to exclude carcinoma. If results are benign, sonohysterogram should be considered for focal lesion work-up.  3. Nonvisualized ovaries   09/23/2019 Pathology Results   A. ENDOMETRIUM, BIOPSY:  - Poorly differentiated carcinoma   10/15/2019 Imaging   Ct scan of chest, abdomen and pelvis: No evidence of metastatic disease within the chest, abdomen, or pelvis.   Enlarged fibroid uterus.   Colonic diverticulosis. No radiographic evidence of diverticulitis.   Aortic and coronary artery atherosclerosis.     10/22/2019 Pathology Results   SURGICAL PATHOLOGY   FINAL MICROSCOPIC DIAGNOSIS:   A. UTERUS, BILATERAL TUBES AND OVARIES, HYSTERECTOMY:  Poorly differentiated carcinoma, 6.5 cm.  Lymphovascular involvement by tumor.  Carcinoma involves inner half of the myometrium.  Margins not involved.  Cervix, bilateral fallopian tubes and bilateral ovaries free of tumor.   B. LYMPH NODE, RIGHT EXTERNAL SENTINEL, BIOPSY:  One lymph node with no metastatic carcinoma (0/1).   C. LYMPH NODE, RIGHT PELVIC, BIOPSY  Five lymph nodes with no metastatic carcinoma (0/5).   D. LYMPH NODE, LEFT PELVIC, BIOPSY:  Five lymph nodes  with no metastatic carcinoma (0/5).   E. LYMPH NODE, RIGHT PERI AORTIC, BIOPSY:  One lymph node with no metastatic carcinoma (0/1).   F. LYMPH NODE, LEFT PERI AORTIC, BIOPSY:  Five lymph nodes with no metastatic carcinoma (0/5).    ONCOLOGY TABLE:  UTERUS, CARCINOMA OR CARCINOSARCOMA   Procedure: Total hysterectomy with bilateral f-oophorectomy and sentinel  lymph nodes.  Histologic type: Poorly differentiated carcinoma, see comment.  Histologic Grade: High-grade, FIGO 3.  Myometrial invasion:       Depth of invasion: 13 mm       Myometrial thickness: 40 mm  Uterine Serosa Involvement: Not identified  Cervical stromal involvement: Not identified  Extent of involvement of other organs: Not identified  Lymphovascular invasion: Present  Regional Lymph Nodes:       Examined:     17 Sentinel                               0 non-sentinel                               17 total        Lymph nodes with metastasis: 0        Isolated tumor cells (<0.2 mm): 0  Micrometastasis:  (>0.2 mm and < 2.0 mm): 0        Macrometastasis: (>2.0 mm): 0  Representative Tumor Block: A5, A6, A7 and A8.  MMR / MSI testing: Pending  Pathologic Stage Classification (pTNM, AJCC 8th edition):  pT1a, pN0  Comments: The carcinoma is a high-grade poorly differentiated carcinoma which morphologically has predominantly serous features.  There are a few foci with squamous differentiation and a rare focus of clear cell features.  Immunohistochemistry for cytokeratin AE1/AE3 is performed on the sentinel lymph nodes and no positivity is identified.    10/22/2019 Surgery   Pre-operative Diagnosis: endometrial cancer grade 3   Post-operative Diagnosis: same,    Operation: Robotic-assisted laparoscopic total hysterectomy for uterus >250gm with bilateral salpingoophorectomy, SLN mapping, bilateral pelvic and para-aortic lymphadenectomy.   Surgeon: Donaciano Eva    Operative Findings:  : 16cm bulky fibroid  uterus, normal ovaries bilaterally, no suspicious lymph nodes.     11/10/2019 Cancer Staging   Staging form: Corpus Uteri - Carcinoma and Carcinosarcoma, AJCC 8th Edition - Pathologic: Stage IVB (pT1a, pN0, cM1) - Signed by [name], MD on 05/16/2020   05/10/2020 Imaging   1. New hypodense 2.0 cm segment 4A left liver lobe mass, suspicious for hepatic metastasis. 2. New left pelvic sidewall 1.4 cm soft tissue nodule, suspicious for left internal iliac nodal metastasis. 3. New left vaginal cuff 1.6 x 1.3 cm soft tissue nodule, suspicious for recurrent tumor. 4. Aortic Atherosclerosis (ICD10-I70.0).   05/18/2020 Procedure   Successful placement of a right internal jugular approach power injectable Port-A-Cath. The catheter is ready for immediate use.   05/23/2020 -  Chemotherapy   The patient had carboplatin and taxol for chemotherapy treatment.     07/28/2020 Imaging   1. No findings identified to suggest recurrent or metastatic disease. 2. Indeterminate and slightly exophytic lesion arising from the inferior pole of left kidney measures 0.8 cm. Technically this is too small to reliably characterize. Attention on follow-up imaging is advised. 3. Aortic atherosclerosis and LAD coronary artery atherosclerotic calcifications.   11/07/2020 Imaging   1. Interval progression of right pelvic sidewall lymphadenopathy, highly concerning for disease progression. PET-CT may prove helpful to further evaluate as clinically warranted. 2. Interval resolution of the previously identified hypodense lesion in segment IVA of the liver. 3. Stable 7 mm subcapsular lesion in the lower pole left kidney with attenuation higher than would be expected for a simple cyst but is too small to reliably characterize. Attention on follow-up recommended. 4. Aortic Atherosclerosis (ICD10-I70.0).   11/16/2020 - 01/04/2021 Chemotherapy          02/02/2021 Imaging   1. Significant interval decrease in size in right pelvic  sidewall, iliac, and inguinal lymph nodes, consistent with treatment response of nodal metastatic disease. 2. Multiple low-attenuation lesions throughout the liver, unchanged compared to prior examination, the majority of these consistent with simple cysts. A previously noted metastatic lesion of anterior hepatic segment VII remains resolved. No new lesions. Findings are consistent with sustained treatment response of hepatic metastatic disease. 3. Status post hysterectomy. Unchanged post treatment appearance of left vaginal cuff and pelvic sidewall soft tissue, consistent with sustained treatment response. 4. No evidence of metastatic disease in the chest.   Aortic Atherosclerosis (ICD10-I70.0).     05/17/2021 Imaging   Mild right external iliac lymphadenopathy, without significant change. No new or progressive disease within the abdomen or pelvis.   Colonic diverticulosis. No radiographic evidence of diverticulitis.   Aortic Atherosclerosis (ICD10-I70.0).  PHYSICAL EXAMINATION: ECOG PERFORMANCE STATUS: 1 - Symptomatic but completely ambulatory  Vitals:   09/11/21 1008  BP: 137/79  Pulse: 67  Resp: 18  Temp: 98.7 F (37.1 C)  SpO2: 100%   Filed Weights   09/11/21 1008  Weight: 199 lb 9.6 oz (90.5 kg)    GENERAL:alert, no distress and comfortable SKIN: skin color, texture, turgor are normal, no rashes or significant lesions EYES: normal, Conjunctiva are pink and non-injected, sclera clear OROPHARYNX:no exudate, no erythema and lips, buccal mucosa, and tongue normal  NECK: supple, thyroid normal size, non-tender, without nodularity LYMPH:  no palpable lymphadenopathy in the cervical, axillary or inguinal LUNGS: clear to auscultation and percussion with normal breathing effort HEART: regular rate & rhythm and no murmurs and no lower extremity edema ABDOMEN:abdomen soft, non-tender and normal bowel sounds Musculoskeletal:no cyanosis of digits and no clubbing  NEURO: alert &  oriented x 3 with fluent speech, no focal motor/sensory deficits  LABORATORY DATA:  I have reviewed the data as listed    Component Value Date/Time   NA 138 09/11/2021 0937   NA 140 07/04/2017 0952   K 4.2 09/11/2021 0937   CL 104 09/11/2021 0937   CO2 25 09/11/2021 0937   GLUCOSE 96 09/11/2021 0937   BUN 11 09/11/2021 0937   BUN 10 07/04/2017 0952   CREATININE 0.93 09/11/2021 0937   CREATININE 0.81 03/23/2015 0946   CALCIUM 9.5 09/11/2021 0937   PROT 7.1 09/11/2021 0937   ALBUMIN 3.9 09/11/2021 0937   AST 19 09/11/2021 0937   ALT 13 09/11/2021 0937   ALKPHOS 66 09/11/2021 0937   BILITOT 0.5 09/11/2021 0937   GFRNONAA >60 09/11/2021 0937   GFRNONAA 78 03/23/2015 0946   GFRAA >60 09/12/2020 0853   GFRAA >89 03/23/2015 0946    No results found for: SPEP, UPEP  Lab Results  Component Value Date   WBC 4.1 09/11/2021   NEUTROABS 2.6 09/11/2021   HGB 9.3 (L) 09/11/2021   HCT 28.4 (L) 09/11/2021   MCV 85.0 09/11/2021   PLT 196 09/11/2021      Chemistry      Component Value Date/Time   NA 138 09/11/2021 0937   NA 140 07/04/2017 0952   K 4.2 09/11/2021 0937   CL 104 09/11/2021 0937   CO2 25 09/11/2021 0937   BUN 11 09/11/2021 0937   BUN 10 07/04/2017 0952   CREATININE 0.93 09/11/2021 0937   CREATININE 0.81 03/23/2015 0946      Component Value Date/Time   CALCIUM 9.5 09/11/2021 0937   ALKPHOS 66 09/11/2021 0937   AST 19 09/11/2021 0937   ALT 13 09/11/2021 0937   BILITOT 0.5 09/11/2021 1610

## 2021-09-11 NOTE — Assessment & Plan Note (Addendum)
Her last CT scan showed no signs of residual disease The abnormal lymphadenopathy seen is nonspecific She is not symptomatic Plan to observe only I recommend repeat imaging study in November for further follow-up and she is in agreement Previously, despite premedication of steroids and Benadryl prior to imaging study, she continues to have reaction to contrast I will proceed to order CT imaging without IV contrast.  We will do oral contrast only

## 2021-09-12 ENCOUNTER — Other Ambulatory Visit (HOSPITAL_COMMUNITY): Payer: Self-pay

## 2021-09-12 ENCOUNTER — Encounter: Payer: Self-pay | Admitting: Internal Medicine

## 2021-09-12 ENCOUNTER — Ambulatory Visit (INDEPENDENT_AMBULATORY_CARE_PROVIDER_SITE_OTHER): Payer: Medicare Other | Admitting: Internal Medicine

## 2021-09-12 ENCOUNTER — Telehealth: Payer: Self-pay | Admitting: Internal Medicine

## 2021-09-12 VITALS — BP 142/78 | HR 66 | Temp 98.2°F | Ht 64.0 in | Wt 197.0 lb

## 2021-09-12 DIAGNOSIS — E785 Hyperlipidemia, unspecified: Secondary | ICD-10-CM

## 2021-09-12 DIAGNOSIS — L299 Pruritus, unspecified: Secondary | ICD-10-CM | POA: Insufficient documentation

## 2021-09-12 DIAGNOSIS — E039 Hypothyroidism, unspecified: Secondary | ICD-10-CM

## 2021-09-12 DIAGNOSIS — I10 Essential (primary) hypertension: Secondary | ICD-10-CM

## 2021-09-12 DIAGNOSIS — R7303 Prediabetes: Secondary | ICD-10-CM | POA: Diagnosis not present

## 2021-09-12 LAB — T4, FREE: Free T4: 1.45 ng/dL (ref 0.60–1.60)

## 2021-09-12 LAB — TSH: TSH: 0.31 u[IU]/mL — ABNORMAL LOW (ref 0.35–5.50)

## 2021-09-12 MED ORDER — TRIAMCINOLONE ACETONIDE 0.1 % EX CREA: 1.0000 "application " | TOPICAL_CREAM | Freq: Two times a day (BID) | CUTANEOUS | 1 refills | Status: DC

## 2021-09-12 MED ORDER — TRIAMCINOLONE ACETONIDE 0.1 % EX CREA
1.0000 "application " | TOPICAL_CREAM | Freq: Two times a day (BID) | CUTANEOUS | 1 refills | Status: DC
  Filled 2021-09-12: qty 30, 15d supply, fill #0

## 2021-09-12 NOTE — Assessment & Plan Note (Signed)
Etiology unclear, for trial skin moisturizers, triam cr prn, and allergy testing,  to f/u any worsening symptoms or concerns

## 2021-09-12 NOTE — Telephone Encounter (Signed)
Patient says she is having significant side effects w/ recently prescribed med levothyroxine (SYNTHROID) 200 MCG tablet  Says she is having jitters & itching all over body  Requesting a call back from provider or nurse

## 2021-09-12 NOTE — Progress Notes (Signed)
Patient ID: Tina Stone, female   DOB: [birth year], 71 y.o.   MRN: [id number]        Chief Complaint: follow up marked itching, hyperglycemia, htn, hld, hypothyroid       HPI:  Tina Stone is a 71 y.o. female here with c/o 3-4 days onset marked itching sensation to the UE's chest and back without swelling or rash, no other skin or urine color change or easy bruising; no prior hx of same, also feels some nervous and shaky, wondering about her thyroid medication she just restarted, and even allergies.  Denies worsening depressive symptoms, suicidal ideation, or panic   Pt denies fever, wt loss, night sweats, loss of appetite, or other constitutional symptoms  No other new complaints       Wt Readings from Last 3 Encounters:  09/12/21 197 lb (89.4 kg)  09/11/21 199 lb 9.6 oz (90.5 kg)  08/17/21 202 lb (91.6 kg)   BP Readings from Last 3 Encounters:  09/12/21 (!) 142/78  09/11/21 137/79  08/17/21 136/86         Past Medical History:  Diagnosis Date   Adenomatous colon polyp    Allergic rhinitis, seasonal    Allergy    Beta thalassemia trait 11/25/2013   Cholelithiasis    Class 3 obesity without serious comorbidity with body mass index (BMI) of 40.0 to 44.9 in adult 11/19/2012   endometrial ca dx'd 08/2009   endometrial    GERD (gastroesophageal reflux disease)    HLD (hyperlipidemia)    Hypercholesterolemia    Hypertension 03/18/2017   no meds    Intraductal papilloma of left breast    Patient underwent left needle-localized lumpectomy by Dr. Imogene Burn. Tsuei on 09/09/2013; pathology showed intraductal papilloma with no atypia or malignancy identified.   PONV (postoperative nausea and vomiting)    Pre-diabetes    pt denies   Uterine fibroid    Past Surgical History:  Procedure Laterality Date   ANKLE FRACTURE SURGERY  1966   right   BREAST EXCISIONAL BIOPSY Left 2014   benign   BREAST LUMPECTOMY WITH NEEDLE LOCALIZATION Left 09/09/2013   Procedure: BREAST LUMPECTOMY  WITH NEEDLE LOCALIZATION;  Surgeon: Imogene Burn. Georgette Dover, MD;  Location: [facility];  Service: General;  Laterality: Left;   CHOLECYSTECTOMY     COLONOSCOPY     IR IMAGING GUIDED PORT INSERTION  05/18/2020   POLYPECTOMY     ROBOTIC ASSISTED LAPAROSCOPIC CHOLECYSTECTOMY  09/09/2019   ROBOTIC ASSISTED TOTAL HYSTERECTOMY WITH BILATERAL SALPINGO OOPHERECTOMY N/A 10/22/2019   Procedure: XI ROBOTIC ASSISTED TOTAL HYSTERECTOMY WITH BILATERAL SALPINGO OOPHORECTOMY GREATER THAN 250 GRAMS, MINI LAPARTOMY FOR SPECIMEN DELIVERY; PELVIC AND PERI-AORTIC LYMPHADENECTOMY;  Surgeon: Everitt Amber, MD;  Location: [facility];  Service: Gynecology;  Laterality: N/A;   SENTINEL NODE BIOPSY N/A 10/22/2019   Procedure: SENTINEL NODE BIOPSY;  Surgeon: Everitt Amber, MD;  Location: [facility];  Service: Gynecology;  Laterality: N/A;    reports that she has never smoked. She has never used smokeless tobacco. She reports that she does not drink alcohol and does not use drugs. family history includes Colon polyps (age of onset: 33) in her mother; Congestive Heart Failure in her father; Dementia (age of onset: 54) in her mother; Diabetes in her father and mother; Hypertension in her mother; Pancreatic cancer in her paternal aunt. Allergies  Allergen Reactions   Benicar Hct [Olmesartan Medoxomil-Hctz] Shortness Of Breath and Palpitations   Iodinated Diagnostic Agents Hives and Itching    02/02/2021-  pt developed 2 hives and itching even with the 13hr premedication.  Radiology PA came into eval the pt.  Developed itching and hives after injection on 05/10/20; needs 13hr prep in future   Bactrim [Sulfamethoxazole-Trimethoprim] Other (See Comments)    Abdominal pain, dizziness   Pravastatin Other (See Comments)    Lower abdominal pain   Shellfish Allergy Nausea And Vomiting   Amlodipine Palpitations   Current Outpatient Medications on File Prior to Visit  Medication Sig Dispense Refill   acetaminophen (TYLENOL) 500 MG tablet Take 1,000 mg by  mouth every 6 (six) hours as needed for moderate pain.     apixaban (ELIQUIS) 5 MG TABS tablet Take 1 tablet (5 mg total) by mouth 2 (two) times daily. 180 tablet 1   APPLE CIDER VINEGAR PO Take 5 mLs by mouth daily.      atenolol (TENORMIN) 25 MG tablet Take 1 tablet (25 mg total) by mouth daily. 90 tablet 1   Cholecalciferol (VITAMIN D3) 50 MCG (2000 UT) TABS Take by mouth.     levocetirizine (XYZAL) 5 MG tablet Take 1 tablet (5 mg total) by mouth every evening. 90 tablet 1   Multiple Vitamin (MULTIVITAMIN) capsule Take 1 capsule by mouth daily.     polyethylene glycol (MIRALAX / GLYCOLAX) 17 g packet Take 17 g by mouth daily as needed for mild constipation.     levothyroxine (SYNTHROID) 200 MCG tablet Take 1 tablet (200 mcg total) by mouth daily. (Patient not taking: Reported on 09/12/2021) 90 tablet 0   [DISCONTINUED] atorvastatin (LIPITOR) 20 MG tablet Take 1 tablet (20 mg total) by mouth daily. 90 tablet 1   No current facility-administered medications on file prior to visit.        ROS:  All others reviewed and negative.  Objective        PE:  BP (!) 142/78 (BP Location: Right Arm, Patient Position: Sitting, Cuff Size: Large)   Pulse 66   Temp 98.2 F (36.8 C) (Oral)   Ht '5\' 4"'$  (1.626 m)   Wt 197 lb (89.4 kg)   SpO2 99%   BMI 33.81 kg/m                 Constitutional: Pt appears in NAD               HENT: Head: NCAT.                Right Ear: External ear normal.                 Left Ear: External ear normal.                Eyes: . Pupils are equal, round, and reactive to light. Conjunctivae and EOM are normal               Nose: without d/c or deformity               Neck: Neck supple. Gross normal ROM               Cardiovascular: Normal rate and regular rhythm.                 Pulmonary/Chest: Effort normal and breath sounds without rales or wheezing.                Abd:  Soft, NT, ND, + BS, no organomegaly               Neurological: Pt  is alert. At baseline  orientation, motor grossly intact               Skin: Skin is warm. No rashes, no other new lesions, LE edema - none               Psychiatric: Pt behavior is normal without agitation , nervous  Micro: none  Cardiac tracings I have personally interpreted today:  none  Pertinent Radiological findings (summarize): none   Lab Results  Component Value Date   WBC 4.1 09/11/2021   HGB 9.3 (L) 09/11/2021   HCT 28.4 (L) 09/11/2021   PLT 196 09/11/2021   GLUCOSE 96 09/11/2021   CHOL 691 (H) 08/17/2021   TRIG 163.0 (H) 08/17/2021   HDL 85.90 08/17/2021   LDLCALC 573 (H) 08/17/2021   ALT 13 09/11/2021   AST 19 09/11/2021   NA 138 09/11/2021   K 4.2 09/11/2021   CL 104 09/11/2021   CREATININE 0.93 09/11/2021   BUN 11 09/11/2021   CO2 25 09/11/2021   TSH 0.31 (L) 09/12/2021   INR 1.0 01/28/2021   HGBA1C 5.8 08/17/2021   Assessment/Plan:  Tina Stone is a 71 y.o. Black or African American [2] female with  has a past medical history of Adenomatous colon polyp, Allergic rhinitis, seasonal, Allergy, Beta thalassemia trait (11/25/2013), Cholelithiasis, Class 3 obesity without serious comorbidity with body mass index (BMI) of 40.0 to 44.9 in adult (11/19/2012), endometrial ca (dx'd 08/2009), GERD (gastroesophageal reflux disease), HLD (hyperlipidemia), Hypercholesterolemia, Hypertension (03/18/2017), Intraductal papilloma of left breast, PONV (postoperative nausea and vomiting), Pre-diabetes, and Uterine fibroid.  Prediabetes Lab Results  Component Value Date   HGBA1C 5.8 08/17/2021   Stable, pt to continue current medical treatment  - diet   Itching Etiology unclear, for trial skin moisturizers, triam cr prn, and allergy testing,  to f/u any worsening symptoms or concerns  Essential hypertension BP Readings from Last 3 Encounters:  09/12/21 (!) 142/78  09/11/21 137/79  08/17/21 136/86   Mild uncontrolled, likely reactive, pt to continue medical treatment tenormin as declines  change   Acquired hypothyroidism Last tsh 3 wks ago > 100 severe uncontrolled; now restarted levothyroxine 200qd, for f/u lab today per pt request  Hyperlipidemia with target LDL less than 100 Very severe uncontrolled likely due to severe low thyroid, for f/u lab with PCp after TFTs improved  Followup: Return if symptoms worsen or fail to improve.  Cathlean Cower, MD 09/12/2021 9:37 PM [facility]

## 2021-09-12 NOTE — Assessment & Plan Note (Signed)
Last tsh 3 wks ago > 100 severe uncontrolled; now restarted levothyroxine 200qd, for f/u lab today per pt request

## 2021-09-12 NOTE — Telephone Encounter (Signed)
Called pt, LVM to schedule OV for med issue.

## 2021-09-12 NOTE — Assessment & Plan Note (Addendum)
BP Readings from Last 3 Encounters:  09/12/21 (!) 142/78  09/11/21 137/79  08/17/21 136/86   Mild uncontrolled, likely reactive, pt to continue medical treatment tenormin as declines change

## 2021-09-12 NOTE — Assessment & Plan Note (Signed)
Very severe uncontrolled likely due to severe low thyroid, for f/u lab with PCp after TFTs improved

## 2021-09-12 NOTE — Patient Instructions (Signed)
Please take all new medication as prescribed - the cream as needed for the itching  Please also consider Eucerin cream OTC for itching as well  Please take ALL medication including the  levothyroxine (thyroid medication) as prescribed  Please continue all other medications as before, and refills have been done if requested.  Please have the pharmacy call with any other refills you may need.  Please keep your appointments with your specialists as you may have planned  Please go to the LAB at the blood drawing area for the tests to be done  You will be contacted by phone if any changes need to be made immediately.  Otherwise, you will receive a letter about your results with an explanation, but please check with MyChart first.  Please remember to sign up for MyChart if you have not done so, as this will be important to you in the future with finding out test results, communicating by private email, and scheduling acute appointments online when needed.  Please see Dr Ronnald Ramp in 4 weeks for repeat testing if needed

## 2021-09-12 NOTE — Assessment & Plan Note (Signed)
Lab Results  Component Value Date   HGBA1C 5.8 08/17/2021   Stable, pt to continue current medical treatment  - diet

## 2021-09-12 NOTE — Progress Notes (Signed)
Cardiology Office Note:    Date:  09/13/2021   ID:  Tina Stone, DOB 09/07/1950, MRN [id number]  PCP:  [name], MD  Cardiologist:  Sinclair Grooms, MD   Referring MD: Isaac Bliss, [place]*   Chief Complaint  Patient presents with   Atrial Fibrillation     History of Present Illness:    Tina Stone is a 71 y.o. female with a hx of new PAF with rapid rate > 180 bpm, converted on diltiazem, obesity, essential hypertension,  and prediabetes.  She has had no palpitations.  She did not wear the Zio patch to screen for A. fib burden.  She has felt fine.  Had only one episode of atrial fibrillation which is the index event occurring in February 2022.  She was then followed by the A. fib clinic/[place].  Chronic anticoagulation was recommended.  She is on Tenormin for blood pressure control.  She has done well since that time.  She wanted a second opinion concerning treatment of atrial fibrillation and medication regimen.  When last seen, the plan was a long-term monitor but patient found to bulky to even attempt wearing.  Past Medical History:  Diagnosis Date   Adenomatous colon polyp    Allergic rhinitis, seasonal    Allergy    Beta thalassemia trait 11/25/2013   Cholelithiasis    Class 3 obesity without serious comorbidity with body mass index (BMI) of 40.0 to 44.9 in adult 11/19/2012   endometrial ca dx'd 08/2009   endometrial    GERD (gastroesophageal reflux disease)    HLD (hyperlipidemia)    Hypercholesterolemia    Hypertension 03/18/2017   no meds    Intraductal papilloma of left breast    Patient underwent left needle-localized lumpectomy by Dr. Imogene Burn. Tsuei on 09/09/2013; pathology showed intraductal papilloma with no atypia or malignancy identified.   PONV (postoperative nausea and vomiting)    Pre-diabetes    pt denies   Uterine fibroid     Past Surgical History:  Procedure Laterality Date   ANKLE FRACTURE SURGERY  1966   right   BREAST  EXCISIONAL BIOPSY Left 2014   benign   BREAST LUMPECTOMY WITH NEEDLE LOCALIZATION Left 09/09/2013   Procedure: BREAST LUMPECTOMY WITH NEEDLE LOCALIZATION;  Surgeon: Imogene Burn. Georgette Dover, MD;  Location: [facility];  Service: General;  Laterality: Left;   CHOLECYSTECTOMY     COLONOSCOPY     IR IMAGING GUIDED PORT INSERTION  05/18/2020   POLYPECTOMY     ROBOTIC ASSISTED LAPAROSCOPIC CHOLECYSTECTOMY  09/09/2019   ROBOTIC ASSISTED TOTAL HYSTERECTOMY WITH BILATERAL SALPINGO OOPHERECTOMY N/A 10/22/2019   Procedure: XI ROBOTIC ASSISTED TOTAL HYSTERECTOMY WITH BILATERAL SALPINGO OOPHORECTOMY GREATER THAN 250 GRAMS, MINI LAPARTOMY FOR SPECIMEN DELIVERY; PELVIC AND PERI-AORTIC LYMPHADENECTOMY;  Surgeon: Everitt Amber, MD;  Location: [facility];  Service: Gynecology;  Laterality: N/A;   SENTINEL NODE BIOPSY N/A 10/22/2019   Procedure: SENTINEL NODE BIOPSY;  Surgeon: Everitt Amber, MD;  Location: [facility];  Service: Gynecology;  Laterality: N/A;    Current Medications: Current Meds  Medication Sig   acetaminophen (TYLENOL) 500 MG tablet Take 1,000 mg by mouth every 6 (six) hours as needed for moderate pain.   apixaban (ELIQUIS) 5 MG TABS tablet Take 1 tablet (5 mg total) by mouth 2 (two) times daily.   APPLE CIDER VINEGAR PO Take 5 mLs by mouth daily.    atenolol (TENORMIN) 25 MG tablet Take 1 tablet (25 mg total) by mouth daily.  Cholecalciferol (VITAMIN D3) 50 MCG (2000 UT) TABS Take by mouth.   levocetirizine (XYZAL) 5 MG tablet Take 1 tablet (5 mg total) by mouth every evening.   levothyroxine (SYNTHROID) 200 MCG tablet Take 1 tablet (200 mcg total) by mouth daily.   Multiple Vitamin (MULTIVITAMIN) capsule Take 1 capsule by mouth daily.   polyethylene glycol (MIRALAX / GLYCOLAX) 17 g packet Take 17 g by mouth daily as needed for mild constipation.   triamcinolone cream (KENALOG) 0.1 % Apply 1 application topically 2 (two) times daily.     Allergies:   Benicar hct [olmesartan medoxomil-hctz], Iodinated diagnostic  agents, Bactrim [sulfamethoxazole-trimethoprim], Pravastatin, Shellfish allergy, and Amlodipine   Social History   Socioeconomic History   Marital status: Widowed    Spouse name: Not on file   Number of children: Not on file   Years of education: Not on file   Highest education level: Not on file  Occupational History   Not on file  Tobacco Use   Smoking status: Never   Smokeless tobacco: Never   Tobacco comments:    few puffs but not a true smoker quit many yrs ago  Vaping Use   Vaping Use: Never used  Substance and Sexual Activity   Alcohol use: Never   Drug use: Never   Sexual activity: Not Currently  Other Topics Concern   Not on file  Social History Narrative   ** Merged History Encounter **       Lives in [place], widowed 2003   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 There is no history of Colon cancer, Breast cancer, Lung cancer, Esophageal cancer, Rectal cancer, or Stomach cancer.  ROS:   Please see the history of present illness.    Anxiety concerning overall condition.  All other systems reviewed and are negative.  EKGs/Labs/Other Studies Reviewed:    The following studies were reviewed today:  ECHOCARDIOGRAPHY 03/06/2021: IMPRESSIONS     1. Left ventricular ejection fraction, by estimation, is 55 to 60%. The  left ventricle has normal function. The left ventricle has no regional  wall motion abnormalities. Left ventricular  diastolic parameters are  consistent with Grade I diastolic  dysfunction (impaired relaxation).   2. Right ventricular systolic function is normal. The right ventricular  size is normal. There is normal pulmonary artery systolic pressure.   3. The mitral valve is abnormal. Trivial mitral valve regurgitation.   4. The aortic valve is tricuspid. Aortic valve regurgitation is not  visualized.   5. Pulmonic valve: Mild leaflet calcification - no stenosis.   6. The inferior vena cava is normal in size with <50% respiratory  variability, suggesting right atrial pressure of 8 mmHg.   Comparison(s): No prior Echocardiogram.   EKG:  EKG  sinus bradycardia with low voltage. Consider amyloid.  Recent Labs: 01/28/2021: Magnesium 1.5 09/11/2021: ALT 13; BUN 11; Creatinine 0.93; Hemoglobin 9.3; Platelet Count 196; Potassium 4.2;  Sodium 138 09/12/2021: TSH 0.31  Recent Lipid Panel    Component Value Date/Time   CHOL 691 (H) 08/17/2021 0838   CHOL 272 (H) 03/18/2017 0901   TRIG 163.0 (H) 08/17/2021 0838   HDL 85.90 08/17/2021 0838   HDL 64 03/18/2017 0901   CHOLHDL 8 08/17/2021 0838   VLDL 32.6 08/17/2021 0838   LDLCALC 573 (H) 08/17/2021 0838   LDLCALC 191 (H) 03/18/2017 0901    Physical Exam:    VS:  BP 138/78   Pulse 70   Ht '5\' 4"'$  (1.626 m)   Wt 195 lb 12.8 oz (88.8 kg)   SpO2 99%   BMI 33.61 kg/m     Wt Readings from Last 3 Encounters:  09/13/21 195 lb 12.8 oz (88.8 kg)  09/12/21 197 lb (89.4 kg)  09/11/21 199 lb 9.6 oz (90.5 kg)     GEN: Overweight. No acute distress HEENT: Normal NECK: No JVD. LYMPHATICS: No lymphadenopathy CARDIAC: No murmur. RRR no gallop, or edema. VASCULAR:  Normal Pulses. No bruits. RESPIRATORY:  Clear to auscultation without rales, wheezing or rhonchi  ABDOMEN: Soft, non-tender, non-distended, No pulsatile mass, MUSCULOSKELETAL: No deformity  SKIN: Warm and dry NEUROLOGIC:  Alert and oriented x 3 PSYCHIATRIC:  Normal affect   ASSESSMENT:     1. Paroxysmal atrial fibrillation (HCC)   2. Primary hypertension   3. Hyperlipidemia LDL goal <70   4. Pre-diabetes   5. Class II obesity   6. Aortic atherosclerosis ([place])   7. Chronic anticoagulation    PLAN:    In order of problems listed above:  1 episode as best we can tell.  We discussed placing a loop recorder to get a better sense of A. fib burden and or recurrences but the patient passed on this.  We discussed the pros and cons of continued anticoagulation.  Her Chads Vascular score is 3.  After conversation she has decided to continue anticoagulation.  The other option was to follow based upon symptoms off of anticoagulation.  She did not feel comfortable with that.  Well-controlled BP is well controlled.  She is on Tenormin 25 mg/day Continue atorvastatin 20 mg/day. Did not discuss Did not discuss Lipids need management to LDL less than 70. Continue statin therapy, exercise, and blood sugar control. Continue Eliquis  Plan follow-up in 6 to 9 months.  Greater than 20-minute office visit spent establishing grounds for shared decision making concerning chronic anticoagulation therapy.   Medication Adjustments/Labs and Tests Ordered: Current medicines are reviewed at length with the patient today.  Concerns regarding medicines are outlined above.  No orders of the defined types were placed in this encounter.  No orders of the defined types were placed in this encounter.   Patient Instructions  Medication Instructions:  Your physician recommends that you continue on your current medications as directed. Please refer to the Current Medication list given to you today.  *If you need a refill on your cardiac medications before your next appointment, please call your pharmacy*   Lab Work: None If you have labs (blood work) drawn today and your tests are completely normal, you will receive your results only by: [place] (if you have MyChart) OR A paper copy in  the mail If you have any lab test that is abnormal or we need to change your treatment, we will call you to review the results.   Testing/Procedures: None   Follow-Up: At [facility], you and your health needs are our priority.  As  part of our continuing mission to provide you with exceptional heart care, we have created designated Provider Care Teams.  These Care Teams include your primary Cardiologist (physician) and Advanced Practice Providers (APPs -  Physician Assistants and Nurse Practitioners) who all work together to provide you with the care you need, when you need it.  We recommend signing up for the patient portal called "MyChart".  Sign up information is provided on this After Visit Summary.  MyChart is used to connect with patients for Virtual Visits (Telemedicine).  Patients are able to view lab/test results, encounter notes, upcoming appointments, etc.  Non-urgent messages can be sent to your provider as well.   To learn more about what you can do with MyChart, go to [email or web address].    Your next appointment:   6-9 month(s)  The format for your next appointment:   In Person  Provider:   You may see Sinclair Grooms, MD or one of the following Advanced Practice Providers on your designated Care Team:   Cecilie Kicks, NP   Other Instructions     Signed, Sinclair Grooms, MD  09/13/2021 1:03 PM    [employer]

## 2021-09-12 NOTE — Telephone Encounter (Signed)
Pt returning phone call... please advise  

## 2021-09-13 ENCOUNTER — Other Ambulatory Visit: Payer: Self-pay

## 2021-09-13 ENCOUNTER — Encounter: Payer: Self-pay | Admitting: Interventional Cardiology

## 2021-09-13 ENCOUNTER — Ambulatory Visit (INDEPENDENT_AMBULATORY_CARE_PROVIDER_SITE_OTHER): Payer: Medicare Other | Admitting: Interventional Cardiology

## 2021-09-13 VITALS — BP 138/78 | HR 70 | Ht 64.0 in | Wt 195.8 lb

## 2021-09-13 DIAGNOSIS — Z7901 Long term (current) use of anticoagulants: Secondary | ICD-10-CM

## 2021-09-13 DIAGNOSIS — I700 Atherosclerosis of aorta: Secondary | ICD-10-CM | POA: Diagnosis not present

## 2021-09-13 DIAGNOSIS — R7303 Prediabetes: Secondary | ICD-10-CM | POA: Diagnosis not present

## 2021-09-13 DIAGNOSIS — I10 Essential (primary) hypertension: Secondary | ICD-10-CM

## 2021-09-13 DIAGNOSIS — I480 Paroxysmal atrial fibrillation: Secondary | ICD-10-CM | POA: Diagnosis not present

## 2021-09-13 DIAGNOSIS — E785 Hyperlipidemia, unspecified: Secondary | ICD-10-CM

## 2021-09-13 DIAGNOSIS — E669 Obesity, unspecified: Secondary | ICD-10-CM

## 2021-09-13 NOTE — Patient Instructions (Signed)
Medication Instructions:  Your physician recommends that you continue on your current medications as directed. Please refer to the Current Medication list given to you today.  *If you need a refill on your cardiac medications before your next appointment, please call your pharmacy*   Lab Work: None If you have labs (blood work) drawn today and your tests are completely normal, you will receive your results only by: [place] (if you have MyChart) OR A paper copy in the mail If you have any lab test that is abnormal or we need to change your treatment, we will call you to review the results.   Testing/Procedures: None   Follow-Up: At [facility], you and your health needs are our priority.  As part of our continuing mission to provide you with exceptional heart care, we have created designated Provider Care Teams.  These Care Teams include your primary Cardiologist (physician) and Advanced Practice Providers (APPs -  Physician Assistants and Nurse Practitioners) who all work together to provide you with the care you need, when you need it.  We recommend signing up for the patient portal called "MyChart".  Sign up information is provided on this After Visit Summary.  MyChart is used to connect with patients for Virtual Visits (Telemedicine).  Patients are able to view lab/test results, encounter notes, upcoming appointments, etc.  Non-urgent messages can be sent to your provider as well.   To learn more about what you can do with MyChart, go to [email or web address].    Your next appointment:   6-9 month(s)  The format for your next appointment:   In Person  Provider:   You may see Sinclair Grooms, MD or one of the following Advanced Practice Providers on your designated Care Team:   Cecilie Kicks, NP   Other Instructions

## 2021-09-14 ENCOUNTER — Encounter: Payer: Self-pay | Admitting: Internal Medicine

## 2021-09-14 LAB — RESPIRATORY ALLERGY PROFILE REGION II ~~LOC~~
Allergen, A. alternata, m6: <0.1 kU/L
Allergen, Cedar tree, t12: 2.44 kU/L — ABNORMAL HIGH
Allergen, Comm Silver Birch, t9: 0.27 kU/L — ABNORMAL HIGH
Allergen, Cottonwood, t14: 0.31 kU/L — ABNORMAL HIGH
Allergen, D pternoyssinus,d7: 2.41 kU/L — ABNORMAL HIGH
Allergen, Mouse Urine Protein, e78: <0.1 kU/L
Allergen, Mulberry, t76: 0.15 kU/L — ABNORMAL HIGH
Allergen, Oak,t7: 0.37 kU/L — ABNORMAL HIGH
Allergen, P. notatum, m1: <0.1 kU/L
Aspergillus fumigatus, m3: <0.1 kU/L
Bermuda Grass: 0.9 kU/L — ABNORMAL HIGH
Box Elder IgE: 0.47 kU/L — ABNORMAL HIGH
CLADOSPORIUM HERBARUM (M2) IGE: <0.1 kU/L
COMMON RAGWEED (SHORT) (W1) IGE: 3.04 kU/L — ABNORMAL HIGH
Cat Dander: 4.08 kU/L — ABNORMAL HIGH
Class: 0
Class: 0
Class: 0
Class: 0
Class: 0
Class: 1
Class: 1
Class: 1
Class: 1
Class: 1
Class: 2
Class: 2
Class: 2
Class: 2
Class: 2
Class: 2
Class: 2
Class: 2
Class: 3
Class: 3
Cockroach: 0.46 kU/L — ABNORMAL HIGH
D. farinae: 4.55 kU/L — ABNORMAL HIGH
Dog Dander: 0.45 kU/L — ABNORMAL HIGH
Elm IgE: 0.92 kU/L — ABNORMAL HIGH
IgE (Immunoglobulin E), Serum: 742 kU/L — ABNORMAL HIGH (ref <=114)
Johnson Grass: 0.78 kU/L — ABNORMAL HIGH
Pecan/Hickory Tree IgE: 0.83 kU/L — ABNORMAL HIGH
Rough Pigweed  IgE: 0.44 kU/L — ABNORMAL HIGH
Sheep Sorrel IgE: 0.23 kU/L — ABNORMAL HIGH
Timothy Grass: 1.66 kU/L — ABNORMAL HIGH

## 2021-09-14 LAB — FOOD ALLERGY PROFILE
Allergen, Salmon, f41: <0.1 kU/L
Almonds: 0.62 kU/L — ABNORMAL HIGH
CLASS: 0
CLASS: 0
CLASS: 0
CLASS: 0
CLASS: 0
CLASS: 1
CLASS: 1
CLASS: 1
CLASS: 1
CLASS: 2
CLASS: 2
Cashew IgE: <0.1 kU/L
Class: 2
Egg White IgE: 0.16 kU/L — ABNORMAL HIGH
Fish Cod: <0.1 kU/L
Hazelnut: 0.36 kU/L — ABNORMAL HIGH
Milk IgE: 0.13 kU/L — ABNORMAL HIGH
Peanut IgE: 1.98 kU/L — ABNORMAL HIGH
Scallop IgE: <0.1 kU/L
Sesame Seed f10: 0.62 kU/L — ABNORMAL HIGH
Shrimp IgE: 0.19 kU/L — ABNORMAL HIGH
Soybean IgE: 0.39 kU/L — ABNORMAL HIGH
Tuna IgE: <0.1 kU/L
Walnut: 1.08 kU/L — ABNORMAL HIGH
Wheat IgE: 0.98 kU/L — ABNORMAL HIGH

## 2021-09-14 LAB — INTERPRETATION:

## 2021-10-02 ENCOUNTER — Other Ambulatory Visit: Payer: Self-pay

## 2021-10-02 ENCOUNTER — Telehealth: Payer: Self-pay | Admitting: Internal Medicine

## 2021-10-02 ENCOUNTER — Encounter: Payer: Self-pay | Admitting: Internal Medicine

## 2021-10-02 ENCOUNTER — Ambulatory Visit (INDEPENDENT_AMBULATORY_CARE_PROVIDER_SITE_OTHER): Payer: Medicare Other | Admitting: Internal Medicine

## 2021-10-02 VITALS — BP 128/70 | HR 73 | Temp 98.9°F | Ht 64.0 in | Wt 192.0 lb

## 2021-10-02 DIAGNOSIS — L509 Urticaria, unspecified: Secondary | ICD-10-CM | POA: Diagnosis not present

## 2021-10-02 DIAGNOSIS — Z7901 Long term (current) use of anticoagulants: Secondary | ICD-10-CM | POA: Diagnosis not present

## 2021-10-02 DIAGNOSIS — R7303 Prediabetes: Secondary | ICD-10-CM | POA: Diagnosis not present

## 2021-10-02 DIAGNOSIS — E559 Vitamin D deficiency, unspecified: Secondary | ICD-10-CM

## 2021-10-02 DIAGNOSIS — I10 Essential (primary) hypertension: Secondary | ICD-10-CM | POA: Diagnosis not present

## 2021-10-02 DIAGNOSIS — E039 Hypothyroidism, unspecified: Secondary | ICD-10-CM | POA: Diagnosis not present

## 2021-10-02 MED ORDER — PREDNISONE 10 MG PO TABS: ORAL_TABLET | ORAL | 0 refills | Status: DC

## 2021-10-02 MED ORDER — LEVOTHYROXINE SODIUM 100 MCG PO TABS: 100.0000 ug | ORAL_TABLET | Freq: Every day | ORAL | 3 refills | Status: DC

## 2021-10-02 MED ORDER — LEVOTHYROXINE SODIUM 75 MCG PO TABS: 75.0000 ug | ORAL_TABLET | Freq: Every day | ORAL | 3 refills | Status: DC

## 2021-10-02 MED ORDER — EPINEPHRINE 0.3 MG/0.3ML IJ SOAJ: 0.3000 mg | INTRAMUSCULAR | 2 refills | Status: DC | PRN

## 2021-10-02 NOTE — Assessment & Plan Note (Signed)
With mild flare, has numerous allergies including peanut - for epipen, predpac asd, and refer allergy for desensitization

## 2021-10-02 NOTE — Assessment & Plan Note (Signed)
Last vitamin D Lab Results  Component Value Date   VD25OH 19 (L) 10/26/2020   Low, to start oral replacement

## 2021-10-02 NOTE — Patient Instructions (Signed)
Ok to stop the levothyroxine 200  Please take all new medication as prescribed - the levothyroxine 100 AND the 75 at the same time  Please go to the [facility] lab in 4 weeks (no appointment, and no fasting) for repeat thyroid testing  Please take all new medication as prescribed - the epipen, and the prednisone  You will be contacted regarding the referral for: Allergy  Please continue all other medications as before, and refills have been done if requested.  Please have the pharmacy call with any other refills you may need.  Please keep your appointments with your specialists as you may have planned

## 2021-10-02 NOTE — Telephone Encounter (Signed)
Patient experiencing headaches and itching, Headaches started about 2 weeks ago, itching going on longer than two weeks  Patient thinks it is levothyroxine (SYNTHROID) 200 MCG tablet  [phone]  Pt has an appointment with Dr. Jenny Reichmann this afternoon

## 2021-10-02 NOTE — Assessment & Plan Note (Signed)
Lab Results  Component Value Date   HGBA1C 5.8 08/17/2021   Stable, pt to continue current medical treatment  - diet

## 2021-10-02 NOTE — Assessment & Plan Note (Signed)
D/w pt, is not clear the HA was due to the high dose thyroid med but her TSH was slightly low recent, so will reduce the levothyroxine from 200 to 175 with f/u lab in 4 wks

## 2021-10-02 NOTE — Assessment & Plan Note (Signed)
To cont eliquis

## 2021-10-02 NOTE — Assessment & Plan Note (Signed)
BP Readings from Last 3 Encounters:  10/02/21 128/70  09/13/21 138/78  09/12/21 (!) 142/78   Stable, pt to continue medical treatment tenormin

## 2021-10-02 NOTE — Progress Notes (Signed)
Patient ID: Tina Stone, female   DOB: April 07, 1950, 71 y.o.   MRN: [id number]        Chief Complaint: follow up recurrent hives and itching, HA and low thyroid, hyperglycemia       HPI:  Tina Stone is a 71 y.o. female here with 2 days onset worsening overall acute on chronic itching all over, with recurrent hives mostly to the upper arms and trunk area, but Pt denies chest pain, increased sob or doe, wheezing, orthopnea, PND, increased LE swelling, palpitations, dizziness or syncope.   Pt denies polydipsia, polyuria, or new neuro s/s.  Recent blood allergy panels are c/w numerous common allergies including peanut.  Pt also with new onset generalized HA last 2 days without throbbing or photophobia, so held her high dose levothyrozine today and HA is resolved, so she is convinced it must be related to the thyroid med high dose.  Denies hyper or hypo thyroid symptoms such as voice, skin or hair change.  Not taking vit d  No overt bleeding on eliquis       Wt Readings from Last 3 Encounters:  10/02/21 192 lb (87.1 kg)  09/13/21 195 lb 12.8 oz (88.8 kg)  09/12/21 197 lb (89.4 kg)   BP Readings from Last 3 Encounters:  10/02/21 128/70  09/13/21 138/78  09/12/21 (!) 142/78         Past Medical History:  Diagnosis Date   Adenomatous colon polyp    Allergic rhinitis, seasonal    Allergy    Beta thalassemia trait 11/25/2013   Cholelithiasis    Class 3 obesity without serious comorbidity with body mass index (BMI) of 40.0 to 44.9 in adult 11/19/2012   endometrial ca dx'd 08/2009   endometrial    GERD (gastroesophageal reflux disease)    HLD (hyperlipidemia)    Hypercholesterolemia    Hypertension 03/18/2017   no meds    Intraductal papilloma of left breast    Patient underwent left needle-localized lumpectomy by Dr. Imogene Burn. Tsuei on 09/09/2013; pathology showed intraductal papilloma with no atypia or malignancy identified.   PONV (postoperative nausea and vomiting)     Pre-diabetes    pt denies   Uterine fibroid    Past Surgical History:  Procedure Laterality Date   ANKLE FRACTURE SURGERY  1966   right   BREAST EXCISIONAL BIOPSY Left 2014   benign   BREAST LUMPECTOMY WITH NEEDLE LOCALIZATION Left 09/09/2013   Procedure: BREAST LUMPECTOMY WITH NEEDLE LOCALIZATION;  Surgeon: Imogene Burn. Georgette Dover, MD;  Location: [facility];  Service: General;  Laterality: Left;   CHOLECYSTECTOMY     COLONOSCOPY     IR IMAGING GUIDED PORT INSERTION  05/18/2020   POLYPECTOMY     ROBOTIC ASSISTED LAPAROSCOPIC CHOLECYSTECTOMY  09/09/2019   ROBOTIC ASSISTED TOTAL HYSTERECTOMY WITH BILATERAL SALPINGO OOPHERECTOMY N/A 10/22/2019   Procedure: XI ROBOTIC ASSISTED TOTAL HYSTERECTOMY WITH BILATERAL SALPINGO OOPHORECTOMY GREATER THAN 250 GRAMS, MINI LAPARTOMY FOR SPECIMEN DELIVERY; PELVIC AND PERI-AORTIC LYMPHADENECTOMY;  Surgeon: Everitt Amber, MD;  Location: [facility];  Service: Gynecology;  Laterality: N/A;   SENTINEL NODE BIOPSY N/A 10/22/2019   Procedure: SENTINEL NODE BIOPSY;  Surgeon: Everitt Amber, MD;  Location: [facility];  Service: Gynecology;  Laterality: N/A;    reports that she has never smoked. She has never used smokeless tobacco. She reports that she does not drink alcohol and does not use drugs. family history includes Colon polyps (age of onset: 17) in her mother; Congestive Heart Failure in her  father; Dementia (age of onset: 63) in her mother; Diabetes in her father and mother; Hypertension in her mother; Pancreatic cancer in her paternal aunt. Allergies  Allergen Reactions   Benicar Hct [Olmesartan Medoxomil-Hctz] Shortness Of Breath and Palpitations   Iodinated Diagnostic Agents Hives and Itching    02/02/2021-  pt developed 2 hives and itching even with the 13hr premedication.  Radiology PA came into eval the pt.  Developed itching and hives after injection on 05/10/20; needs 13hr prep in future   Bactrim [Sulfamethoxazole-Trimethoprim] Other (See Comments)    Abdominal pain,  dizziness   Pravastatin Other (See Comments)    Lower abdominal pain   Shellfish Allergy Nausea And Vomiting   Amlodipine Palpitations   Current Outpatient Medications on File Prior to Visit  Medication Sig Dispense Refill   acetaminophen (TYLENOL) 500 MG tablet Take 1,000 mg by mouth every 6 (six) hours as needed for moderate pain.     apixaban (ELIQUIS) 5 MG TABS tablet Take 1 tablet (5 mg total) by mouth 2 (two) times daily. 180 tablet 1   APPLE CIDER VINEGAR PO Take 5 mLs by mouth daily.      atenolol (TENORMIN) 25 MG tablet Take 1 tablet (25 mg total) by mouth daily. 90 tablet 1   Cholecalciferol (VITAMIN D3) 50 MCG (2000 UT) TABS Take by mouth.     levocetirizine (XYZAL) 5 MG tablet Take 1 tablet (5 mg total) by mouth every evening. 90 tablet 1   Multiple Vitamin (MULTIVITAMIN) capsule Take 1 capsule by mouth daily.     polyethylene glycol (MIRALAX / GLYCOLAX) 17 g packet Take 17 g by mouth daily as needed for mild constipation.     triamcinolone cream (KENALOG) 0.1 % Apply 1 application topically 2 (two) times daily. 30 g 1   [DISCONTINUED] atorvastatin (LIPITOR) 20 MG tablet Take 1 tablet (20 mg total) by mouth daily. 90 tablet 1   No current facility-administered medications on file prior to visit.        ROS:  All others reviewed and negative.  Objective        PE:  BP 128/70 (BP Location: Right Arm, Patient Position: Sitting, Cuff Size: Normal)   Pulse 73   Temp 98.9 F (37.2 C) (Oral)   Ht 5\' 4"  (1.626 m)   Wt 192 lb (87.1 kg)   SpO2 99%   BMI 32.96 kg/m                 Constitutional: Pt appears in NAD               HENT: Head: NCAT.                Right Ear: External ear normal.                 Left Ear: External ear normal.                Eyes: . Pupils are equal, round, and reactive to light. Conjunctivae and EOM are normal               Nose: without d/c or deformity               Neck: Neck supple. Gross normal ROM               Cardiovascular: Normal rate  and regular rhythm.                 Pulmonary/Chest: Effort normal and  breath sounds without rales or wheezing.                Abd:  Soft, NT, ND, + BS, no organomegaly               Neurological: Pt is alert. At baseline orientation, motor grossly intact               Skin:  LE edema - none, has several wheal and flare noted to right upper arm               Psychiatric: Pt behavior is normal without agitation   Micro: none  Cardiac tracings I have personally interpreted today:  none  Pertinent Radiological findings (summarize): none   Lab Results  Component Value Date   WBC 4.1 09/11/2021   HGB 9.3 (L) 09/11/2021   HCT 28.4 (L) 09/11/2021   PLT 196 09/11/2021   GLUCOSE 96 09/11/2021   CHOL 691 (H) 08/17/2021   TRIG 163.0 (H) 08/17/2021   HDL 85.90 08/17/2021   LDLCALC 573 (H) 08/17/2021   ALT 13 09/11/2021   AST 19 09/11/2021   NA 138 09/11/2021   K 4.2 09/11/2021   CL 104 09/11/2021   CREATININE 0.93 09/11/2021   BUN 11 09/11/2021   CO2 25 09/11/2021   TSH 0.31 (L) 09/12/2021   INR 1.0 01/28/2021   HGBA1C 5.8 08/17/2021   Assessment/Plan:  Tina Stone is a 71 y.o. Black or African American [2] female with  has a past medical history of Adenomatous colon polyp, Allergic rhinitis, seasonal, Allergy, Beta thalassemia trait (11/25/2013), Cholelithiasis, Class 3 obesity without serious comorbidity with body mass index (BMI) of 40.0 to 44.9 in adult (11/19/2012), endometrial ca (dx'd 08/2009), GERD (gastroesophageal reflux disease), HLD (hyperlipidemia), Hypercholesterolemia, Hypertension (03/18/2017), Intraductal papilloma of left breast, PONV (postoperative nausea and vomiting), Pre-diabetes, and Uterine fibroid.  Vitamin D deficiency Last vitamin D Lab Results  Component Value Date   VD25OH 73 (L) 10/26/2020   Low, to start oral replacement   Essential hypertension BP Readings from Last 3 Encounters:  10/02/21 128/70  09/13/21 138/78  09/12/21 (!) 142/78    Stable, pt to continue medical treatment tenormin   Chronic anticoagulation To cont eliquis  Hives With mild flare, has numerous allergies including peanut - for epipen, predpac asd, and refer allergy for desensitization  Prediabetes Lab Results  Component Value Date   HGBA1C 5.8 08/17/2021   Stable, pt to continue current medical treatment  - diet   Acquired hypothyroidism D/w pt, is not clear the HA was due to the high dose thyroid med but her TSH was slightly low recent, so will reduce the levothyroxine from 200 to 175 with f/u lab in 4 wks Followup: Return if symptoms worsen or fail to improve.  Cathlean Cower, MD 10/02/2021 8:42 PM [facility]

## 2021-10-04 ENCOUNTER — Encounter: Payer: Medicare Other | Admitting: Internal Medicine

## 2021-10-10 ENCOUNTER — Emergency Department (HOSPITAL_COMMUNITY)
Admission: EM | Admit: 2021-10-10 | Discharge: 2021-10-10 | Disposition: A | Discharge status: Home / Self Care | Payer: Medicare Other | Attending: Emergency Medicine | Admitting: Emergency Medicine

## 2021-10-10 ENCOUNTER — Encounter (HOSPITAL_COMMUNITY): Payer: Self-pay | Admitting: Emergency Medicine

## 2021-10-10 ENCOUNTER — Other Ambulatory Visit: Payer: Self-pay

## 2021-10-10 ENCOUNTER — Other Ambulatory Visit: Payer: Self-pay | Admitting: Internal Medicine

## 2021-10-10 DIAGNOSIS — R002 Palpitations: Secondary | ICD-10-CM | POA: Diagnosis not present

## 2021-10-10 DIAGNOSIS — R0789 Other chest pain: Secondary | ICD-10-CM | POA: Insufficient documentation

## 2021-10-10 DIAGNOSIS — Z8542 Personal history of malignant neoplasm of other parts of uterus: Secondary | ICD-10-CM | POA: Diagnosis not present

## 2021-10-10 DIAGNOSIS — I10 Essential (primary) hypertension: Secondary | ICD-10-CM | POA: Diagnosis not present

## 2021-10-10 DIAGNOSIS — R0602 Shortness of breath: Secondary | ICD-10-CM | POA: Diagnosis not present

## 2021-10-10 DIAGNOSIS — Z79899 Other long term (current) drug therapy: Secondary | ICD-10-CM | POA: Diagnosis not present

## 2021-10-10 DIAGNOSIS — E038 Other specified hypothyroidism: Secondary | ICD-10-CM | POA: Insufficient documentation

## 2021-10-10 DIAGNOSIS — Z7901 Long term (current) use of anticoagulants: Secondary | ICD-10-CM | POA: Insufficient documentation

## 2021-10-10 NOTE — ED Provider Notes (Signed)
[facility] EMERGENCY DEPARTMENT Provider Note   CSN: [id number] Arrival date & time: 10/10/21  0356     History Chief Complaint  Patient presents with   Chest Pain / Palpitations     Tina Stone is a 71 y.o. female.  Patient to ED with complaint of chest tightness and heart racing that woke her from sleep. She felt SOB. No fever or cough. No nausea, vomiting. She reports she has a history of atrial fibrillation, on Eliquis, with similar symptoms. She felt all symptoms resolve on the way to the emergency department.   The history is provided by the patient. No language interpreter was used.      Past Medical History:  Diagnosis Date   Adenomatous colon polyp    Allergic rhinitis, seasonal    Allergy    Beta thalassemia trait 11/25/2013   Cholelithiasis    Class 3 obesity without serious comorbidity with body mass index (BMI) of 40.0 to 44.9 in adult 11/19/2012   endometrial ca dx'd 08/2009   endometrial    GERD (gastroesophageal reflux disease)    HLD (hyperlipidemia)    Hypercholesterolemia    Hypertension 03/18/2017   no meds    Intraductal papilloma of left breast    Patient underwent left needle-localized lumpectomy by Dr. Imogene Burn. Tsuei on 09/09/2013; pathology showed intraductal papilloma with no atypia or malignancy identified.   PONV (postoperative nausea and vomiting)    Pre-diabetes    pt denies   Uterine fibroid     Patient Active Problem List   Diagnosis Date Noted   Chronic anticoagulation 10/02/2021   Hives 10/02/2021   Itching 09/12/2021   Visit for screening mammogram 08/18/2021   Acquired hypothyroidism 08/18/2021   Allergic rhinoconjunctivitis 08/18/2021   Encounter for general adult medical examination with abnormal findings 08/18/2021   Hyperlipidemia with target LDL less than 100 08/17/2021   Deficiency anemia 08/17/2021   Laryngitis, chronic 08/17/2021   Paroxysmal atrial fibrillation ([place]) 02/03/2021   Secondary  hypercoagulable state ([place]) 02/03/2021   Essential hypertension 11/16/2020   Vitamin D deficiency 10/27/2020   Metastasis to liver ([place]) 06/14/2020   Goals of care, counseling/discussion 05/16/2020   Endometrial cancer ([place]) 10/22/2019   Prediabetes 03/18/2017   Beta thalassemia trait 11/25/2013   Class II obesity 11/19/2012   Gastroesophageal reflux disease 01/28/2007    Past Surgical History:  Procedure Laterality Date   ANKLE FRACTURE SURGERY  1966   right   BREAST EXCISIONAL BIOPSY Left 2014   benign   BREAST LUMPECTOMY WITH NEEDLE LOCALIZATION Left 09/09/2013   Procedure: BREAST LUMPECTOMY WITH NEEDLE LOCALIZATION;  Surgeon: Imogene Burn. Georgette Dover, MD;  Location: [place];  Service: General;  Laterality: Left;   CHOLECYSTECTOMY     COLONOSCOPY     IR IMAGING GUIDED PORT INSERTION  05/18/2020   POLYPECTOMY     ROBOTIC ASSISTED LAPAROSCOPIC CHOLECYSTECTOMY  09/09/2019   ROBOTIC ASSISTED TOTAL HYSTERECTOMY WITH BILATERAL SALPINGO OOPHERECTOMY N/A 10/22/2019   Procedure: XI ROBOTIC ASSISTED TOTAL HYSTERECTOMY WITH BILATERAL SALPINGO OOPHORECTOMY GREATER THAN 250 GRAMS, MINI LAPARTOMY FOR SPECIMEN DELIVERY; PELVIC AND PERI-AORTIC LYMPHADENECTOMY;  Surgeon: Everitt Amber, MD;  Location: [facility];  Service: Gynecology;  Laterality: N/A;   SENTINEL NODE BIOPSY N/A 10/22/2019   Procedure: SENTINEL NODE BIOPSY;  Surgeon: Everitt Amber, MD;  Location: [facility];  Service: Gynecology;  Laterality: N/A;     OB History     Gravida  1   Para  0   Term  0  Preterm  0   AB  1   Living  0      SAB  0   IAB  1   Ectopic  0   Multiple  0   Live Births  0           Family History  Problem Relation Age of Onset   Diabetes Mother    Hypertension Mother    Colon polyps Mother 53   Dementia Mother 68   Diabetes Father    Congestive Heart Failure Father    Pancreatic cancer Paternal Aunt    Colon cancer Neg Hx    Breast cancer Neg Hx    Lung cancer Neg Hx    Esophageal cancer Neg Hx     Rectal cancer Neg Hx    Stomach cancer Neg Hx     Social History   Tobacco Use   Smoking status: Never   Smokeless tobacco: Never   Tobacco comments:    few puffs but not a true smoker quit many yrs ago  Vaping Use   Vaping Use: Never used  Substance Use Topics   Alcohol use: Never   Drug use: Never    Home Medications Prior to Admission medications   Medication Sig Start Date End Date Taking? Authorizing Provider  acetaminophen (TYLENOL) 500 MG tablet Take 1,000 mg by mouth every 6 (six) hours as needed for moderate pain.    [provider]  apixaban (ELIQUIS) 5 MG TABS tablet Take 1 tablet (5 mg total) by mouth 2 (two) times daily. 08/17/21   [name], MD  APPLE CIDER VINEGAR PO Take 5 mLs by mouth daily.     [provider]  atenolol (TENORMIN) 25 MG tablet Take 1 tablet (25 mg total) by mouth daily. 08/17/21   [name], MD  Cholecalciferol (VITAMIN D3) 50 MCG (2000 UT) TABS Take by mouth.    [provider]  EPINEPHrine (EPIPEN 2-PAK) 0.3 mg/0.3 mL IJ SOAJ injection Inject 0.3 mg into the muscle as needed for anaphylaxis. 10/02/21   Biagio Borg, MD  levocetirizine (XYZAL) 5 MG tablet Take 1 tablet (5 mg total) by mouth every evening. 08/17/21   [name], MD  levothyroxine (SYNTHROID) 100 MCG tablet Take 1 tablet (100 mcg total) by mouth daily. 10/02/21   Biagio Borg, MD  levothyroxine (SYNTHROID) 75 MCG tablet Take 1 tablet (75 mcg total) by mouth daily. 10/02/21   Biagio Borg, MD  Multiple Vitamin (MULTIVITAMIN) capsule Take 1 capsule by mouth daily.    [provider]  polyethylene glycol (MIRALAX / GLYCOLAX) 17 g packet Take 17 g by mouth daily as needed for mild constipation.    [provider]  predniSONE (DELTASONE) 10 MG tablet 3 tabs by mouth per day for 3 days,2tabs per day for 3 days,1tab per day for 3 days 10/02/21   Biagio Borg, MD  triamcinolone cream (KENALOG) 0.1 % Apply 1 application topically  2 (two) times daily. 09/12/21 09/12/22  Biagio Borg, MD  atorvastatin (LIPITOR) 20 MG tablet Take 1 tablet (20 mg total) by mouth daily. 05/03/20 07/01/20  Erline Hau, MD    Allergies    Benicar hct [olmesartan medoxomil-hctz], Iodinated diagnostic agents, Bactrim [sulfamethoxazole-trimethoprim], Pravastatin, Shellfish allergy, and Amlodipine  Review of Systems   Review of Systems  Constitutional:  Negative for chills and fever.  HENT: Negative.    Respiratory:  Positive for chest tightness and shortness of  breath.   Cardiovascular:  Positive for palpitations.  Gastrointestinal: Negative.  Negative for nausea.  Musculoskeletal: Negative.   Skin: Negative.   Neurological: Negative.  Negative for weakness and light-headedness.   Physical Exam Updated Vital Signs BP (!) 159/95   Pulse (!) 133   Temp 98.1 F (36.7 C) (Oral)   Resp 16   Ht 5\' 4"  (1.626 m)   Wt 92 kg   SpO2 98%   BMI 34.81 kg/m   Physical Exam Vitals and nursing note reviewed.  Constitutional:      Appearance: She is well-developed.  HENT:     Head: Normocephalic.  Cardiovascular:     Rate and Rhythm: Normal rate and regular rhythm.     Heart sounds: No murmur heard. Pulmonary:     Effort: Pulmonary effort is normal.     Breath sounds: Normal breath sounds. No wheezing, rhonchi or rales.  Abdominal:     General: Bowel sounds are normal.     Palpations: Abdomen is soft.     Tenderness: There is no abdominal tenderness. There is no guarding or rebound.  Musculoskeletal:        General: Normal range of motion.     Cervical back: Normal range of motion and neck supple.  Skin:    General: Skin is warm and dry.  Neurological:     General: No focal deficit present.     Mental Status: She is alert and oriented to person, place, and time.    ED Results / Procedures / Treatments   Labs (all labs ordered are listed, but only abnormal results are displayed) Labs Reviewed - No data to  display  EKG None  Radiology No results found.  Procedures Procedures   Medications Ordered in ED Medications - No data to display  ED Course  I have reviewed the triage vital signs and the nursing notes.  Pertinent labs & imaging results that were available during my care of the patient were reviewed by me and considered in my medical decision making (see chart for details).    MDM Rules/Calculators/A&P                           Patient to ED after waking around 2:30 am with chest tightness, SOB and palpitations that resolved on the way to the hospital.   EKG on arrival shows tachycardia to 133. On exam she is regular with normal rate. Repeat EKG performed that confirms NSR, rate of 78.   Discussed recommended lab studies but the patient feels she is back to normal and prefers to follow up with her doctor in the office. Given the findings on repeat EKG, the patient is felt stable for discharge home with close PCP follow up.   Final Clinical Impression(s) / ED Diagnoses Final diagnoses:  None   Palpitations   Rx / DC Orders ED Discharge Orders     None        Charlann Lange, PA-C 10/10/21 0454    Merryl Hacker, MD 10/10/21 [phone]

## 2021-10-10 NOTE — Discharge Instructions (Addendum)
You can be discharged home as we discussed but please plan to see your doctor in close follow up later today.   Return to the ED with any new or concerning symptoms.

## 2021-10-10 NOTE — ED Triage Notes (Signed)
Patient woke up at 3 am this morning with palpitations and left chest pain / SOB . Denies emesis or diaphoresis .

## 2021-10-11 DIAGNOSIS — K219 Gastro-esophageal reflux disease without esophagitis: Secondary | ICD-10-CM | POA: Diagnosis not present

## 2021-10-11 DIAGNOSIS — R490 Dysphonia: Secondary | ICD-10-CM | POA: Diagnosis not present

## 2021-10-12 ENCOUNTER — Ambulatory Visit (INDEPENDENT_AMBULATORY_CARE_PROVIDER_SITE_OTHER): Payer: Medicare Other | Admitting: Internal Medicine

## 2021-10-12 ENCOUNTER — Encounter: Payer: Self-pay | Admitting: Internal Medicine

## 2021-10-12 ENCOUNTER — Other Ambulatory Visit: Payer: Self-pay

## 2021-10-12 VITALS — BP 130/68 | HR 68 | Temp 98.3°F | Ht 64.0 in | Wt 193.0 lb

## 2021-10-12 DIAGNOSIS — E039 Hypothyroidism, unspecified: Secondary | ICD-10-CM

## 2021-10-12 DIAGNOSIS — I480 Paroxysmal atrial fibrillation: Secondary | ICD-10-CM

## 2021-10-12 DIAGNOSIS — E559 Vitamin D deficiency, unspecified: Secondary | ICD-10-CM

## 2021-10-12 DIAGNOSIS — R7303 Prediabetes: Secondary | ICD-10-CM | POA: Diagnosis not present

## 2021-10-12 DIAGNOSIS — I10 Essential (primary) hypertension: Secondary | ICD-10-CM | POA: Diagnosis not present

## 2021-10-13 ENCOUNTER — Telehealth: Payer: Self-pay | Admitting: Internal Medicine

## 2021-10-13 ENCOUNTER — Encounter: Payer: Self-pay | Admitting: Hematology and Oncology

## 2021-10-13 NOTE — Telephone Encounter (Signed)
Pt stated insurance needed a "medical necessity approval"  [employer] [phone]

## 2021-10-13 NOTE — Telephone Encounter (Signed)
Patient requesting call back from nurse as soon as she is available  Please call [phone]

## 2021-10-14 ENCOUNTER — Encounter: Payer: Self-pay | Admitting: Internal Medicine

## 2021-10-14 NOTE — Assessment & Plan Note (Signed)
Lab Results  Component Value Date   HGBA1C 5.8 08/17/2021   Stable, pt to continue current medical treatment  - diet

## 2021-10-14 NOTE — Assessment & Plan Note (Signed)
Last vitamin D Lab Results  Component Value Date   VD25OH 19 (L) 10/26/2020   Low, to start oral replacement

## 2021-10-14 NOTE — Assessment & Plan Note (Signed)
overcontrolled recenlty, with recent decreased levothryoxine, for f/u lab in another 2 wks,  to f/u any worsening symptoms or concerns

## 2021-10-14 NOTE — Assessment & Plan Note (Signed)
BP Readings from Last 3 Encounters:  10/12/21 130/68  10/10/21 (!) 159/95  10/02/21 128/70   Stable, pt to continue medical treatment tenormin 25

## 2021-10-14 NOTE — Assessment & Plan Note (Signed)
O/w stable, pt declines cardiac monitor or change in med tx for now as had no further symptoms

## 2021-10-14 NOTE — Progress Notes (Signed)
Patient ID: Tina Stone, female   DOB: 1950-05-19, 71 y.o.   MRN: [id number]        Chief Complaint: follow up low thyroid, gerd, and palpitaitons with hx of PAF       HPI:  Tina Stone is a 71 y.o. female here with c/o episode palpiations sustained with mild sob and waking her from sleep  seen in ED oct 11, but resolved by then.  Pt denies chest pain, wheezing, orthopnea, PND, increased LE swelling, dizziness or syncope.   Pt denies polydipsia, polyuria, or new focal neuro s/s.  Denies hyper or hypo thyroid symptoms such as voice, skin or hair change, but has been more nervous, and had levothyroxine reduced from 200 mcg to 175 mcg qd x 2 wks ago.  Has had mild worsening reflux, but no abd pain, dysphagia, n/v, bowel change or blood. Wt Readings from Last 3 Encounters:  10/12/21 193 lb (87.5 kg)  10/10/21 202 lb 13.2 oz (92 kg)  10/02/21 192 lb (87.1 kg)   BP Readings from Last 3 Encounters:  10/12/21 130/68  10/10/21 (!) 159/95  10/02/21 128/70         Past Medical History:  Diagnosis Date   Adenomatous colon polyp    Allergic rhinitis, seasonal    Allergy    Beta thalassemia trait 11/25/2013   Cholelithiasis    Class 3 obesity without serious comorbidity with body mass index (BMI) of 40.0 to 44.9 in adult 11/19/2012   endometrial ca dx'd 08/2009   endometrial    GERD (gastroesophageal reflux disease)    HLD (hyperlipidemia)    Hypercholesterolemia    Hypertension 03/18/2017   no meds    Intraductal papilloma of left breast    Patient underwent left needle-localized lumpectomy by Dr. Imogene Burn. Tsuei on 09/09/2013; pathology showed intraductal papilloma with no atypia or malignancy identified.   PONV (postoperative nausea and vomiting)    Pre-diabetes    pt denies   Uterine fibroid    Past Surgical History:  Procedure Laterality Date   ANKLE FRACTURE SURGERY  1966   right   BREAST EXCISIONAL BIOPSY Left 2014   benign   BREAST LUMPECTOMY WITH NEEDLE LOCALIZATION  Left 09/09/2013   Procedure: BREAST LUMPECTOMY WITH NEEDLE LOCALIZATION;  Surgeon: Imogene Burn. Georgette Dover, MD;  Location: [facility];  Service: General;  Laterality: Left;   CHOLECYSTECTOMY     COLONOSCOPY     IR IMAGING GUIDED PORT INSERTION  05/18/2020   POLYPECTOMY     ROBOTIC ASSISTED LAPAROSCOPIC CHOLECYSTECTOMY  09/09/2019   ROBOTIC ASSISTED TOTAL HYSTERECTOMY WITH BILATERAL SALPINGO OOPHERECTOMY N/A 10/22/2019   Procedure: XI ROBOTIC ASSISTED TOTAL HYSTERECTOMY WITH BILATERAL SALPINGO OOPHORECTOMY GREATER THAN 250 GRAMS, MINI LAPARTOMY FOR SPECIMEN DELIVERY; PELVIC AND PERI-AORTIC LYMPHADENECTOMY;  Surgeon: Everitt Amber, MD;  Location: [facility];  Service: Gynecology;  Laterality: N/A;   SENTINEL NODE BIOPSY N/A 10/22/2019   Procedure: SENTINEL NODE BIOPSY;  Surgeon: Everitt Amber, MD;  Location: [facility];  Service: Gynecology;  Laterality: N/A;    reports that she has never smoked. She has never used smokeless tobacco. She reports that she does not drink alcohol and does not use drugs. family history includes Colon polyps (age of onset: 32) in her mother; Congestive Heart Failure in her father; Dementia (age of onset: 71) in her mother; Diabetes in her father and mother; Hypertension in her mother; Pancreatic cancer in her paternal aunt. Allergies  Allergen Reactions   Benicar Hct [Olmesartan Medoxomil-Hctz] Shortness Of  Breath and Palpitations   Iodinated Diagnostic Agents Hives and Itching    02/02/2021-  pt developed 2 hives and itching even with the 13hr premedication.  Radiology PA came into eval the pt.  Developed itching and hives after injection on 05/10/20; needs 13hr prep in future   Bactrim [Sulfamethoxazole-Trimethoprim] Other (See Comments)    Abdominal pain, dizziness   Pravastatin Other (See Comments)    Lower abdominal pain   Shellfish Allergy Nausea And Vomiting   Amlodipine Palpitations   Current Outpatient Medications on File Prior to Visit  Medication Sig Dispense Refill    acetaminophen (TYLENOL) 500 MG tablet Take 1,000 mg by mouth every 6 (six) hours as needed for moderate pain.     apixaban (ELIQUIS) 5 MG TABS tablet Take 1 tablet (5 mg total) by mouth 2 (two) times daily. 180 tablet 1   APPLE CIDER VINEGAR PO Take 5 mLs by mouth daily.      atenolol (TENORMIN) 25 MG tablet Take 1 tablet (25 mg total) by mouth daily. 90 tablet 1   Cholecalciferol (VITAMIN D3) 50 MCG (2000 UT) TABS Take by mouth.     EPINEPHrine (EPIPEN 2-PAK) 0.3 mg/0.3 mL IJ SOAJ injection Inject 0.3 mg into the muscle as needed for anaphylaxis. 1 each 2   levocetirizine (XYZAL) 5 MG tablet Take 1 tablet (5 mg total) by mouth every evening. 90 tablet 1   levothyroxine (SYNTHROID) 100 MCG tablet Take 100 mcg by mouth daily before breakfast.     levothyroxine (SYNTHROID) 75 MCG tablet Take 1 tablet (75 mcg total) by mouth daily. 90 tablet 3   Multiple Vitamin (MULTIVITAMIN) capsule Take 1 capsule by mouth daily.     polyethylene glycol (MIRALAX / GLYCOLAX) 17 g packet Take 17 g by mouth daily as needed for mild constipation.     triamcinolone cream (KENALOG) 0.1 % Apply 1 application topically 2 (two) times daily. 30 g 1   [DISCONTINUED] atorvastatin (LIPITOR) 20 MG tablet Take 1 tablet (20 mg total) by mouth daily. 90 tablet 1   No current facility-administered medications on file prior to visit.        ROS:  All others reviewed and negative.  Objective        PE:  BP 130/68 (BP Location: Right Arm, Patient Position: Sitting, Cuff Size: Large)   Pulse 68   Temp 98.3 F (36.8 C) (Oral)   Ht 5\' 4"  (1.626 m)   Wt 193 lb (87.5 kg)   SpO2 100%   BMI 33.13 kg/m                 Constitutional: Pt appears in NAD               HENT: Head: NCAT.                Right Ear: External ear normal.                 Left Ear: External ear normal.                Eyes: . Pupils are equal, round, and reactive to light. Conjunctivae and EOM are normal               Nose: without d/c or deformity                Neck: Neck supple. Gross normal ROM               Cardiovascular: Normal rate  and regular rhythm.                 Pulmonary/Chest: Effort normal and breath sounds without rales or wheezing.                Abd:  Soft, NT, ND, + BS, no organomegaly               Neurological: Pt is alert. At baseline orientation, motor grossly intact               Skin: Skin is warm. No rashes, no other new lesions, LE edema - none               Psychiatric: Pt behavior is normal without agitation   Micro: none  Cardiac tracings I have personally interpreted today:  none  Pertinent Radiological findings (summarize): none   Lab Results  Component Value Date   WBC 4.1 09/11/2021   HGB 9.3 (L) 09/11/2021   HCT 28.4 (L) 09/11/2021   PLT 196 09/11/2021   GLUCOSE 96 09/11/2021   CHOL 691 (H) 08/17/2021   TRIG 163.0 (H) 08/17/2021   HDL 85.90 08/17/2021   LDLCALC 573 (H) 08/17/2021   ALT 13 09/11/2021   AST 19 09/11/2021   NA 138 09/11/2021   K 4.2 09/11/2021   CL 104 09/11/2021   CREATININE 0.93 09/11/2021   BUN 11 09/11/2021   CO2 25 09/11/2021   TSH 0.31 (L) 09/12/2021   INR 1.0 01/28/2021   HGBA1C 5.8 08/17/2021   Assessment/Plan:  Evita Merida is a 71 y.o. Black or African American [2] female with  has a past medical history of Adenomatous colon polyp, Allergic rhinitis, seasonal, Allergy, Beta thalassemia trait (11/25/2013), Cholelithiasis, Class 3 obesity without serious comorbidity with body mass index (BMI) of 40.0 to 44.9 in adult (11/19/2012), endometrial ca (dx'd 08/2009), GERD (gastroesophageal reflux disease), HLD (hyperlipidemia), Hypercholesterolemia, Hypertension (03/18/2017), Intraductal papilloma of left breast, PONV (postoperative nausea and vomiting), Pre-diabetes, and Uterine fibroid.  Paroxysmal atrial fibrillation (HCC) O/w stable, pt declines cardiac monitor or change in med tx for now as had no further symptoms  Prediabetes Lab Results  Component Value Date    HGBA1C 5.8 08/17/2021   Stable, pt to continue current medical treatment  - diet   Vitamin D deficiency Last vitamin D Lab Results  Component Value Date   VD25OH 19 (L) 10/26/2020   Low, to start oral replacement   Essential hypertension BP Readings from Last 3 Encounters:  10/12/21 130/68  10/10/21 (!) 159/95  10/02/21 128/70   Stable, pt to continue medical treatment tenormin 25   Acquired hypothyroidism overcontrolled recenlty, with recent decreased levothryoxine, for f/u lab in another 2 wks,  to f/u any worsening symptoms or concerns  Followup: Return if symptoms worsen or fail to improve.  Cathlean Cower, MD 10/14/2021 10:43 PM [facility]

## 2021-10-14 NOTE — Patient Instructions (Addendum)
Please continue all other medications as before, and refills have been done if requested.  Please have the pharmacy call with any other refills you may need.  Please continue your efforts at being more active, low cholesterol diet, and weight control.  Please keep your appointments with your specialists as you may have planned     

## 2021-10-16 NOTE — Telephone Encounter (Signed)
Pt has given ref# provided to be by [facility] rep.

## 2021-10-16 NOTE — Telephone Encounter (Signed)
Ref# [id number]  I have spoke to a rep at [facility]. They stated that all referrals are waived until 2023. No referrals needed for in or out of network. No medical approval needed.   Called pt, LVM stating the above. Requesting that she call the office if she would like reference # that was provided.

## 2021-10-16 NOTE — Telephone Encounter (Signed)
Pt states she would like a call back as she has questions about the information given to her.

## 2021-10-18 NOTE — Progress Notes (Signed)
NEW PATIENT Date of Service/Encounter:  10/20/21 Referring provider: Biagio Borg, MD Primary care provider: [name], MD  Subjective:  Tina Stone is a 71 y.o. female with a PMHx of endometrial cancer with liver metastasis, hypertension, atrial fibrillation on chronic anticoagulation, acquired hypothryoidsim, prediabetes, GERD, vimtain D deficiency, hyperlipdemia,  presenting today for evaluation of hives History obtained from: chart review and patient.   Patient reports that 3 days ago she used a new hair oil for hair growth.  Around 2 hours later she developed generalized hives.  She had never used this hair oil prior to this day.  She does not remember eating anything in this 2-hour window.  She denies vomiting, diarrhea, coughing, lightheadedness.  Possibly some shortness of breath.  No visible swelling.  She went to the emergency room and was treated with antihistamines.  The hives resolved within 3 to 4 hours.  However, the next day the hives returned, and have returned every day since.  She has not identified a trigger.  She has had an extensive prior allergen panel work-up for foods and respiratory allergens which had multiple positives (see below).  She does get seasonal allergies which have been overall mild, and has not had hives associated with these in the past.  Regarding the food panel, she does say that she has a known allergy to almonds and shellfish because crabs and shellfish give her immediate stomach ache, nausea, and sometimes vomiting or diarrhea.  However, on further questioning, she is eating honey nut Cheerios which contain almonds on a regular basis without symptoms.   She also can eat tuna, salmon, scallops, cod, and cashews and pistachios without any symptoms. She eats wheat and soy on most days which she has been able to tolerate. She does not eat hazelnut, sesame, or peanut-all part of her positive panel.  However, she had not had these foods on the day  of her initial hives breakout.  Chart Review:  Having hives and increased rhinitis-allergy panels obtained via serum (see below)  Previous Diagnostics (08/2021):  - food panel: positive to peanut (1.98), wheat (0.98), walnut (1.08), soy (0.39), sesame (0.62), hazelnut (0.36), almonds (0.62). - total IgE 742 - respiratory panel: positives pigweed, ragweed, Johnson, Timothy, [place], pecan/hickory tree, elm, cedar, box elder, cockroach, dog, cat, dust mites With indeterminate elevations for sheep, mulberry, cottonwood, silver birch  Other allergy screening: Asthma: no Medication allergy: yes Hymenoptera allergy: no Urticaria: yes Eczema:no History of recurrent infections suggestive of immunodeficency: no   Past Medical History: Past Medical History:  Diagnosis Date   Adenomatous colon polyp    Allergic rhinitis, seasonal    Allergy    Beta thalassemia trait 11/25/2013   Cholelithiasis    Class 3 obesity without serious comorbidity with body mass index (BMI) of 40.0 to 44.9 in adult 11/19/2012   endometrial ca dx'd 08/2009   endometrial    GERD (gastroesophageal reflux disease)    HLD (hyperlipidemia)    Hypercholesterolemia    Hypertension 03/18/2017   no meds    Intraductal papilloma of left breast    Patient underwent left needle-localized lumpectomy by Dr. Imogene Burn. Tsuei on 09/09/2013; pathology showed intraductal papilloma with no atypia or malignancy identified.   PONV (postoperative nausea and vomiting)    Pre-diabetes    pt denies   Urticaria    Uterine fibroid    Medication List:  Current Outpatient Medications  Medication Sig Dispense Refill   acetaminophen (TYLENOL) 500 MG tablet Take  1,000 mg by mouth every 6 (six) hours as needed for moderate pain.     apixaban (ELIQUIS) 5 MG TABS tablet Take 1 tablet (5 mg total) by mouth 2 (two) times daily. 180 tablet 1   APPLE CIDER VINEGAR PO Take 5 mLs by mouth daily.      atenolol (TENORMIN) 25 MG tablet Take 1  tablet (25 mg total) by mouth daily. 90 tablet 1   cetirizine (ZYRTEC ALLERGY) 10 MG tablet Take 1 tablet twice daily.  If hives not controlled, increase to 2 tablets twice daily.  Do this until hives free x 3 weeks, then can start decreasing 1 tablet every 3-5 days until off. 120 tablet 4   Cholecalciferol (VITAMIN D3) 50 MCG (2000 UT) TABS Take by mouth.     EPINEPHrine (EPIPEN 2-PAK) 0.3 mg/0.3 mL IJ SOAJ injection Inject 0.3 mg into the muscle as needed for anaphylaxis. 1 each 2   levocetirizine (XYZAL) 5 MG tablet Take 1 tablet (5 mg total) by mouth every evening. 90 tablet 1   levothyroxine (SYNTHROID) 100 MCG tablet Take 100 mcg by mouth daily before breakfast.     levothyroxine (SYNTHROID) 75 MCG tablet Take 1 tablet (75 mcg total) by mouth daily. 90 tablet 3   Multiple Vitamin (MULTIVITAMIN) capsule Take 1 capsule by mouth daily.     polyethylene glycol (MIRALAX / GLYCOLAX) 17 g packet Take 17 g by mouth daily as needed for mild constipation.     triamcinolone cream (KENALOG) 0.1 % Apply 1 application topically 2 (two) times daily. 30 g 1   No current facility-administered medications for this visit.   Known Allergies:  Allergies  Allergen Reactions   Benicar Hct [Olmesartan Medoxomil-Hctz] Shortness Of Breath and Palpitations   Iodinated Diagnostic Agents Hives and Itching    02/02/2021-  pt developed 2 hives and itching even with the 13hr premedication.  Radiology PA came into eval the pt.  Developed itching and hives after injection on 05/10/20; needs 13hr prep in future   Bactrim [Sulfamethoxazole-Trimethoprim] Other (See Comments)    Abdominal pain, dizziness   Pravastatin Other (See Comments)    Lower abdominal pain   Shellfish Allergy Nausea And Vomiting   Amlodipine Palpitations   Past Surgical History: Past Surgical History:  Procedure Laterality Date   ANKLE FRACTURE SURGERY  1966   right   BREAST EXCISIONAL BIOPSY Left 2014   benign   BREAST LUMPECTOMY WITH NEEDLE  LOCALIZATION Left 09/09/2013   Procedure: BREAST LUMPECTOMY WITH NEEDLE LOCALIZATION;  Surgeon: Imogene Burn. Georgette Dover, MD;  Location: [facility];  Service: General;  Laterality: Left;   CHOLECYSTECTOMY     COLONOSCOPY     IR IMAGING GUIDED PORT INSERTION  05/18/2020   POLYPECTOMY     ROBOTIC ASSISTED LAPAROSCOPIC CHOLECYSTECTOMY  09/09/2019   ROBOTIC ASSISTED TOTAL HYSTERECTOMY WITH BILATERAL SALPINGO OOPHERECTOMY N/A 10/22/2019   Procedure: XI ROBOTIC ASSISTED TOTAL HYSTERECTOMY WITH BILATERAL SALPINGO OOPHORECTOMY GREATER THAN 250 GRAMS, MINI LAPARTOMY FOR SPECIMEN DELIVERY; PELVIC AND PERI-AORTIC LYMPHADENECTOMY;  Surgeon: Everitt Amber, MD;  Location: [facility];  Service: Gynecology;  Laterality: N/A;   SENTINEL NODE BIOPSY N/A 10/22/2019   Procedure: SENTINEL NODE BIOPSY;  Surgeon: Everitt Amber, MD;  Location: [facility];  Service: Gynecology;  Laterality: N/A;   Family History: Family History  Problem Relation Age of Onset   Diabetes Mother    Hypertension Mother    Colon polyps Mother 59   Dementia Mother 48   Diabetes Father    Congestive  Heart Failure Father    Asthma Brother    Pancreatic cancer Paternal Aunt    Asthma Paternal Grandmother    Colon cancer Neg Hx    Breast cancer Neg Hx    Lung cancer Neg Hx    Esophageal cancer Neg Hx    Rectal cancer Neg Hx    Stomach cancer Neg Hx    Social History: Steven lives In an apartment built 4 years ago without water damage, carpet in the bedroom, electric heating, central AC, no pets, no visible pests, using dust mite protection on bedding, no smoke exposure.  She is a caregiver for 14 years. ROS:  All other systems negative except as noted per HPI.  Objective:  Blood pressure 118/72, pulse (!) 59, temperature 97.8 F (36.6 C), temperature source Temporal, resp. rate 16, height 5\' 4"  (1.626 m), weight 193 lb (87.5 kg), SpO2 97 %. Body mass index is 33.13 kg/m. Physical Exam:  General Appearance:  Alert, cooperative, no distress, appears  stated age  Head:  Normocephalic, without obvious abnormality, atraumatic  Eyes:  Conjunctiva clear, EOM's intact  Nose: Nares normal, turbinates normal  Throat: Lips, tongue normal; teeth and gums normal, normal posterior oropharynx  Neck: Supple, symmetrical  Lungs:   Clear to auscultation bilaterally, respirations unlabored, no coughing  Heart:  Regular rate and rhythm, no murmur appears well perfused  Extremities: No edema  Skin: Skin color, texture, turgor normal, no rashes or lesions on visualized portions of skin  Neurologic: No gross deficits   Diagnostics: Skin Testing: Environmental allergy panel and select foods. Adequate positive and negative controls Results discussed with patient/family.  Airborne Adult Perc - 10/20/21 0935     Time Antigen Placed 0935    Allergen Manufacturer Lavella Hammock    Location Back    Number of Test 59    Panel 1 Select    1. Control-Buffer 50% Glycerol Negative    2. Control-Histamine 1 mg/ml 3+    3. Albumin saline Negative    4. [place] Negative    5. [place] 3+    6. Johnson Negative    7. [place] Blue Negative    8. Meadow Fescue Negative    9. Perennial Rye 3+    10. Sweet Vernal Negative    11. Timothy 3+    12. Cocklebur Negative    13. Burweed Marshelder Negative    14. Ragweed, short Negative    15. Ragweed, Giant Negative    16. Plantain,  English Negative    17. Lamb's Quarters Negative    18. Sheep Sorrell Negative    19. Rough Pigweed Negative    20. Marsh Elder, Rough Negative    21. Mugwort, Common 3+    22. Ash mix Negative    23. Birch mix Negative    24. Beech American Negative    25. Box, Elder Negative    26. Cedar, red Negative    27. Cottonwood, [place] Negative    28. Elm mix 3+    29. Hickory Negative    30. Maple mix Negative    31. Oak, [place] mix 3+    32. Pecan Pollen Negative    33. Pine mix Negative    34. Sycamore Eastern Negative    35. Villas, Black Pollen 3+    36. Alternaria alternata  Negative    37. Cladosporium Herbarum Negative    38. Aspergillus mix Negative    39. Penicillium mix Negative    40. Bipolaris sorokiniana (Helminthosporium)  Negative    41. Drechslera spicifera (Curvularia) Negative    42. Mucor plumbeus Negative    43. Fusarium moniliforme Negative    44. Aureobasidium pullulans (pullulara) Negative    45. Rhizopus oryzae Negative    46. Botrytis cinera Negative    47. Epicoccum nigrum Negative    48. Phoma betae Negative    49. Candida Albicans Negative    50. Trichophyton mentagrophytes Negative    51. Mite, D Farinae  5,000 AU/ml Negative    52. Mite, D Pteronyssinus  5,000 AU/ml Negative    53. Cat Hair 10,000 BAU/ml Negative    54.  Dog Epithelia Negative    55. Mixed Feathers Negative    56. Horse Epithelia Negative    57. Cockroach, German Negative    58. Mouse Negative    59. Tobacco Leaf Negative             Food Perc - 10/20/21 0936       Test Information   Time Antigen Placed 8832    Allergen Manufacturer Lavella Hammock    Location Back    Number of allergen test 10    Food Select      Food   1. Peanut Negative    2. Soybean food Negative    3. Wheat, whole Negative    4. Sesame Negative    5. Milk, cow Negative    6. Egg White, chicken Negative    7. Casein Negative    8. Shellfish mix Negative    9. Fish mix Negative    10. Cashew Negative             Food Adult Perc - 10/20/21 0900     Time Antigen Placed 5498    Allergen Manufacturer Lavella Hammock    Location Back    Number of allergen test 1    47. Mushrooms Negative             Allergy testing results were read and interpreted by myself, documented by clinical staff.  Assessment and Plan  Acute hives-given the nature of her history, do not suspect allergic etiology of hives since these have been recurring with an unknown trigger.   On review of her chart after patient had left our clinic, she has been seen multiple times since September for itching and  over 3 weeks ago for hives.   I suspect chronic idiopathic urticaria.  She does have thyroid disease and it is well-known that this is often associated with chronic idiopathic urticaria.  I tried to discuss the perils of a strict diet based on positive testing alone, as allergy testing has around 50% false positive rate for food allergens when obtained in persons with a low likelihood of allergy based on history.  We carefully reviewed all food she is eating and tolerating, and I have instructed her to keep these in her diet.  Will continue to discuss food allergies at upcoming visits.   Given that her hives have persisted for multiple days following use of hair oil, doubt this is a likely source of her hives. She does have environmental allergies, but do not suspect these are a major contributor to her current hives. She has no red flag symptoms or signs, but will obtain basic screening labs to rule out other rare etiologies associated with urticaria.  I expect these will be reassuring.  Patient Instructions  Acute Urticaria (hives lasting less than 6 weeks, unknown trigger): - skin testing today showed positive  to grasses, weed (mugwor), trees (elm, oak, walnut) - hives can be from a number of different sources including infections, allergies, vibration, temperature, pressure among many others other possible causes - often an identifiable cause is not determined - some potential triggers include: stress, illness, NSAIDs, aspirin, hormonal changes - you do not have any red flag symptoms to make [place] concerned about secondary causes of hives, but we will screen for these for reassurance with: CBC w diff, tryptase, alpha-gal panel - approximately 50% of patients with chronic hives can have some associated swelling of the face/lips/eyelids (this is not a cause for alarm and does not typically progress onto systemic allergic reactions) - if you do have concerns for airway involvement or are having a  severe reaction, use your Epinephrine autoinjector and call 911  Therapy Plan:  - start zyrtec (cetirizine) 10mg  twice daily - if hives remain uncontrolled, increase dose of zyrtec (cetirizine) to max dose of 20mg  (2 pills) twice daily- this is maximum dose - can increase or decrease dosing depending on symptom control to a maximum dose of 4 tablets of antihistamine daily. Wait until hives free for at least one month prior to decreasing dose.   - if hives are still uncontrolled with the above regimen, please arrange an appointment for discussion of Xolair (omalizumab)- an injectable medication for hives  Can use one of the following in place of zyrtec if desires: Claritin (loratadine) 10 mg, Xyzal (levocetirizine) 5 mg or Allegra (fexofenadine) 180 mg daily as needed  Food allergy:  - today's skin testing was negative to mushrooms (ingredient in hair oil) - previous serum testing positive to peanut, wheat, walnut, soy, sesame, hazelnut and almonds - since you are eating and tolerating wheat and soy, okay to continue doing so (these are considered false positives) - please strictly avoid shellfish, tree nuts (except cashews and pistachios which you already eat and tolerate), peanuts, sesame since you have had symptoms with these. - for SKIN only reaction, take zyrtec as described above - for SKIN + ANY additional symptoms, OR IF concern for LIFE THREATENING reaction = Epipen Autoinjector EpiPen 0.3 mg. - If using Epinephrine autoinjector, call 911 - A food allergy action plan has been provided and discussed. - Medic Alert identification is recommended.   This note in its entirety was forwarded to the Provider who requested this consultation.  Thank you for your kind referral. I appreciate the opportunity to take part in Saylee's care. Please do not hesitate to contact me with questions.  Sincerely,  Sigurd Sos, MD [facility]

## 2021-10-19 ENCOUNTER — Emergency Department (HOSPITAL_COMMUNITY)
Admission: EM | Admit: 2021-10-19 | Discharge: 2021-10-19 | Discharge status: Against Medical Advice | Payer: Medicare Other | Attending: Emergency Medicine | Admitting: Emergency Medicine

## 2021-10-19 ENCOUNTER — Encounter (HOSPITAL_COMMUNITY): Payer: Self-pay | Admitting: Emergency Medicine

## 2021-10-19 ENCOUNTER — Other Ambulatory Visit: Payer: Self-pay

## 2021-10-19 DIAGNOSIS — Z7901 Long term (current) use of anticoagulants: Secondary | ICD-10-CM | POA: Diagnosis not present

## 2021-10-19 DIAGNOSIS — I10 Essential (primary) hypertension: Secondary | ICD-10-CM | POA: Insufficient documentation

## 2021-10-19 DIAGNOSIS — T7840XA Allergy, unspecified, initial encounter: Secondary | ICD-10-CM | POA: Diagnosis not present

## 2021-10-19 MED ORDER — DEXAMETHASONE SODIUM PHOSPHATE 10 MG/ML IJ SOLN
10.0000 mg | Freq: Once | INTRAMUSCULAR | Status: DC
  Filled 2021-10-19: qty 1

## 2021-10-19 MED ORDER — FAMOTIDINE 20 MG PO TABS
20.0000 mg | ORAL_TABLET | Freq: Once | ORAL | Status: DC
  Filled 2021-10-19: qty 1

## 2021-10-19 MED ORDER — LORATADINE 10 MG PO TABS
10.0000 mg | ORAL_TABLET | Freq: Once | ORAL | Status: DC
  Filled 2021-10-19 (×2): qty 1

## 2021-10-19 NOTE — ED Notes (Signed)
Pt reported she did not want to take the decadron, pepcid or the claritin due to her allergy doctors appt tomorrow and the fact that her hives and redness have completely gone away. Dr. Randal Buba informed via face to face conversation. This RN explained to the patient that there is a chance of rebound reaction which means if left untreated her allergic reaction could reoccur when she gets home. The patient reported she understood and also showed this RN an epipen that she had in her bag. This RN explained to her the proper way to use the epipen and pt verbalized and demonstrated understanding. This RN went over the risks of refusing treatment and AMA again with the patient and she still verbalized understanding. Pt A&OX4, ambulatory with independent steady gait.

## 2021-10-19 NOTE — ED Triage Notes (Signed)
Pt c/o hives to her back and itchiness all over, after using a new oil for her hair. Denies lip/tongue swelling, shortness of breath.

## 2021-10-19 NOTE — ED Provider Notes (Signed)
[facility] EMERGENCY DEPARTMENT Provider Note   CSN: [id number] Arrival date & time: 10/19/21  0037     History Chief Complaint  Patient presents with   Allergic Reaction    Tina Stone is a 71 y.o. female.  The history is provided by the patient.  Allergic Reaction Presenting symptoms: itching and rash   Presenting symptoms: no difficulty breathing, no difficulty swallowing, no swelling and no wheezing   Severity:  Moderate Duration: hours. Prior allergic episodes:  Allergies to medications Context comment:  A hair tonic with multiple herbs and chemicals Relieved by:  Nothing Worsened by:  Nothing Ineffective treatments:  None tried Has not taken anything for her symptoms as she has allergy testing on Friday.      Past Medical History:  Diagnosis Date   Adenomatous colon polyp    Allergic rhinitis, seasonal    Allergy    Beta thalassemia trait 11/25/2013   Cholelithiasis    Class 3 obesity without serious comorbidity with body mass index (BMI) of 40.0 to 44.9 in adult 11/19/2012   endometrial ca dx'd 08/2009   endometrial    GERD (gastroesophageal reflux disease)    HLD (hyperlipidemia)    Hypercholesterolemia    Hypertension 03/18/2017   no meds    Intraductal papilloma of left breast    Patient underwent left needle-localized lumpectomy by Dr. Imogene Burn. Tsuei on 09/09/2013; pathology showed intraductal papilloma with no atypia or malignancy identified.   PONV (postoperative nausea and vomiting)    Pre-diabetes    pt denies   Uterine fibroid     Patient Active Problem List   Diagnosis Date Noted   Chronic anticoagulation 10/02/2021   Hives 10/02/2021   Itching 09/12/2021   Visit for screening mammogram 08/18/2021   Acquired hypothyroidism 08/18/2021   Allergic rhinoconjunctivitis 08/18/2021   Encounter for general adult medical examination with abnormal findings 08/18/2021   Hyperlipidemia with target LDL less than 100 08/17/2021    Deficiency anemia 08/17/2021   Laryngitis, chronic 08/17/2021   Paroxysmal atrial fibrillation ([place]) 02/03/2021   Secondary hypercoagulable state ([place]) 02/03/2021   Essential hypertension 11/16/2020   Vitamin D deficiency 10/27/2020   Metastasis to liver ([place]) 06/14/2020   Goals of care, counseling/discussion 05/16/2020   Endometrial cancer ([place]) 10/22/2019   Prediabetes 03/18/2017   Beta thalassemia trait 11/25/2013   Class II obesity 11/19/2012   Gastroesophageal reflux disease 01/28/2007    Past Surgical History:  Procedure Laterality Date   ANKLE FRACTURE SURGERY  1966   right   BREAST EXCISIONAL BIOPSY Left 2014   benign   BREAST LUMPECTOMY WITH NEEDLE LOCALIZATION Left 09/09/2013   Procedure: BREAST LUMPECTOMY WITH NEEDLE LOCALIZATION;  Surgeon: Imogene Burn. Georgette Dover, MD;  Location: [place];  Service: General;  Laterality: Left;   CHOLECYSTECTOMY     COLONOSCOPY     IR IMAGING GUIDED PORT INSERTION  05/18/2020   POLYPECTOMY     ROBOTIC ASSISTED LAPAROSCOPIC CHOLECYSTECTOMY  09/09/2019   ROBOTIC ASSISTED TOTAL HYSTERECTOMY WITH BILATERAL SALPINGO OOPHERECTOMY N/A 10/22/2019   Procedure: XI ROBOTIC ASSISTED TOTAL HYSTERECTOMY WITH BILATERAL SALPINGO OOPHORECTOMY GREATER THAN 250 GRAMS, MINI LAPARTOMY FOR SPECIMEN DELIVERY; PELVIC AND PERI-AORTIC LYMPHADENECTOMY;  Surgeon: Everitt Amber, MD;  Location: [facility];  Service: Gynecology;  Laterality: N/A;   SENTINEL NODE BIOPSY N/A 10/22/2019   Procedure: SENTINEL NODE BIOPSY;  Surgeon: Everitt Amber, MD;  Location: [facility];  Service: Gynecology;  Laterality: N/A;     OB History  Gravida  1   Para  0   Term  0   Preterm  0   AB  1   Living  0      SAB  0   IAB  1   Ectopic  0   Multiple  0   Live Births  0           Family History  Problem Relation Age of Onset   Diabetes Mother    Hypertension Mother    Colon polyps Mother 70   Dementia Mother 24   Diabetes Father    Congestive Heart Failure Father     Pancreatic cancer Paternal Aunt    Colon cancer Neg Hx    Breast cancer Neg Hx    Lung cancer Neg Hx    Esophageal cancer Neg Hx    Rectal cancer Neg Hx    Stomach cancer Neg Hx     Social History   Tobacco Use   Smoking status: Never   Smokeless tobacco: Never   Tobacco comments:    few puffs but not a true smoker quit many yrs ago  Vaping Use   Vaping Use: Never used  Substance Use Topics   Alcohol use: Never   Drug use: Never    Home Medications Prior to Admission medications   Medication Sig Start Date End Date Taking? Authorizing Provider  acetaminophen (TYLENOL) 500 MG tablet Take 1,000 mg by mouth every 6 (six) hours as needed for moderate pain.    [provider]  apixaban (ELIQUIS) 5 MG TABS tablet Take 1 tablet (5 mg total) by mouth 2 (two) times daily. 08/17/21   [name], MD  APPLE CIDER VINEGAR PO Take 5 mLs by mouth daily.     [provider]  atenolol (TENORMIN) 25 MG tablet Take 1 tablet (25 mg total) by mouth daily. 08/17/21   [name], MD  Cholecalciferol (VITAMIN D3) 50 MCG (2000 UT) TABS Take by mouth.    [provider]  EPINEPHrine (EPIPEN 2-PAK) 0.3 mg/0.3 mL IJ SOAJ injection Inject 0.3 mg into the muscle as needed for anaphylaxis. 10/02/21   Biagio Borg, MD  levocetirizine (XYZAL) 5 MG tablet Take 1 tablet (5 mg total) by mouth every evening. 08/17/21   [name], MD  levothyroxine (SYNTHROID) 100 MCG tablet Take 100 mcg by mouth daily before breakfast.    [provider]  levothyroxine (SYNTHROID) 75 MCG tablet Take 1 tablet (75 mcg total) by mouth daily. 10/02/21   Biagio Borg, MD  Multiple Vitamin (MULTIVITAMIN) capsule Take 1 capsule by mouth daily.    [provider]  polyethylene glycol (MIRALAX / GLYCOLAX) 17 g packet Take 17 g by mouth daily as needed for mild constipation.    [provider]  triamcinolone cream (KENALOG) 0.1 % Apply 1 application topically 2 (two) times  daily. 09/12/21 09/12/22  Biagio Borg, MD  atorvastatin (LIPITOR) 20 MG tablet Take 1 tablet (20 mg total) by mouth daily. 05/03/20 07/01/20  Erline Hau, MD    Allergies    Benicar hct [olmesartan medoxomil-hctz], Iodinated diagnostic agents, Bactrim [sulfamethoxazole-trimethoprim], Pravastatin, Shellfish allergy, and Amlodipine  Review of Systems   Review of Systems  Constitutional:  Negative for fever.  HENT:  Negative for drooling, facial swelling and trouble swallowing.   Eyes:  Negative for redness.  Respiratory:  Negative for wheezing.   Genitourinary:  Negative for difficulty urinating.  Skin:  Positive  for itching and rash.  Neurological:  Negative for facial asymmetry.  Psychiatric/Behavioral:  Negative for agitation.   All other systems reviewed and are negative.  Physical Exam Updated Vital Signs BP (!) 158/71   Pulse 68   Temp 97.8 F (36.6 C) (Oral)   Resp 18   SpO2 100%   Physical Exam Vitals and nursing note reviewed.  Constitutional:      Appearance: Normal appearance.  HENT:     Head: Normocephalic and atraumatic.     Nose: Nose normal.  Eyes:     Extraocular Movements: Extraocular movements intact.     Pupils: Pupils are equal, round, and reactive to light.  Cardiovascular:     Rate and Rhythm: Normal rate and regular rhythm.     Pulses: Normal pulses.     Heart sounds: Normal heart sounds.  Pulmonary:     Effort: Pulmonary effort is normal.     Breath sounds: Normal breath sounds.  Abdominal:     General: Abdomen is flat. Bowel sounds are normal.     Palpations: Abdomen is soft.     Tenderness: There is no abdominal tenderness. There is no guarding.  Musculoskeletal:        General: Normal range of motion.     Cervical back: Normal range of motion and neck supple.  Skin:    General: Skin is warm and dry.     Capillary Refill: Capillary refill takes less than 2 seconds.     Comments: No hives at this time   Neurological:      General: No focal deficit present.     Mental Status: She is alert and oriented to person, place, and time.     Deep Tendon Reflexes: Reflexes normal.  Psychiatric:        Mood and Affect: Mood normal.        Behavior: Behavior normal.    ED Results / Procedures / Treatments   Labs (all labs ordered are listed, but only abnormal results are displayed) Labs Reviewed - No data to display  EKG None  Radiology No results found.  Procedures Procedures   Medications Ordered in ED Medications  dexamethasone (DECADRON) injection 10 mg (has no administration in time range)  famotidine (PEPCID) tablet 20 mg (has no administration in time range)  loratadine (CLARITIN) tablet 10 mg (has no administration in time range)    ED Course  I have reviewed the triage vital signs and the nursing notes.  Pertinent labs & imaging results that were available during my care of the patient were reviewed by me and considered in my medical decision making (see chart for details).   Patient refused allergic reaction medication as she does not want to interfere with her allergy testing on Friday.  She was told symptoms could return but she has refused medication.  She is Ao4 and has decision making capacity to refuse care.  She understands the risks and is welcome to return at any time.   Final Clinical Impression(s) / ED Diagnoses Final diagnoses:  Allergic reaction, initial encounter    Rx / DC Orders ED Discharge Orders     None        Irina Okelly, MD 10/19/21 [phone]

## 2021-10-20 ENCOUNTER — Encounter: Payer: Self-pay | Admitting: Internal Medicine

## 2021-10-20 ENCOUNTER — Ambulatory Visit (INDEPENDENT_AMBULATORY_CARE_PROVIDER_SITE_OTHER): Payer: Medicare Other | Admitting: Internal Medicine

## 2021-10-20 VITALS — BP 118/72 | HR 59 | Temp 97.8°F | Resp 16 | Ht 64.0 in | Wt 193.0 lb

## 2021-10-20 DIAGNOSIS — L509 Urticaria, unspecified: Secondary | ICD-10-CM

## 2021-10-20 DIAGNOSIS — L500 Allergic urticaria: Secondary | ICD-10-CM

## 2021-10-20 MED ORDER — CETIRIZINE HCL 10 MG PO TABS: ORAL_TABLET | ORAL | 4 refills | Status: DC

## 2021-10-20 NOTE — Patient Instructions (Signed)
Acute Urticaria (hives lasting less than 6 weeks, unknown trigger): - skin testing today showed positive to grasses, weed (mugwor), trees (elm, oak, walnut) - hives can be from a number of different sources including infections, allergies, vibration, temperature, pressure among many others other possible causes - often an identifiable cause is not determined - some potential triggers include: stress, illness, NSAIDs, aspirin, hormonal changes - you do not have any red flag symptoms to make [place] concerned about secondary causes of hives, but we will screen for these for reassurance with: CBC w diff, tryptase, alpha-gal panel - approximately 50% of patients with chronic hives can have some associated swelling of the face/lips/eyelids (this is not a cause for alarm and does not typically progress onto systemic allergic reactions) - if you do have concerns for airway involvement or are having a severe reaction, use your Epinephrine autoinjector and call 911  Therapy Plan:  - start zyrtec (cetirizine) to 10mg  twice daily - if hives remain uncontrolled, increase dose of zyrtec (cetirizine) to max dose of 20mg  (2 pills) twice daily- this is maximum dose - can increase or decrease dosing depending on symptom control to a maximum dose of 4 tablets of antihistamine daily. Wait until hives free for at least one month prior to decreasing dose.   - if hives are still uncontrolled with the above regimen, please arrange an appointment for discussion of Xolair (omalizumab)- an injectable medication for hives  Can use one of the following in place of zyrtec if desires: Claritin (loratadine) 10 mg, Xyzal (levocetirizine) 5 mg or Allegra (fexofenadine) 180 mg daily as needed  Food allergy:  - today's skin testing was negative to mushrooms - previous serum testing positive to peanut, wheat, walnut, soy, sesame, hazelnut and almonds - since you are eating and tolerating wheat and soy, okay to continue doing so (these  are considered false positives) - please strictly avoid shellfish, tree nuts (except cashews and pistachios which you already eat and tolerate), peanuts, sesame - for SKIN only reaction, take zyrtec as described above - for SKIN + ANY additional symptoms, OR IF concern for LIFE THREATENING reaction = Epipen Autoinjector EpiPen 0.3 mg. - If using Epinephrine autoinjector, call 911 - A food allergy action plan has been provided and discussed. - Medic Alert identification is recommended.  -------------------------------------------------------------------- Reducing Pollen Exposure  The American Academy of Allergy, Asthma and Immunology suggests the following steps to reduce your exposure to pollen during allergy seasons.    Do not hang sheets or clothing out to dry; pollen may collect on these items. Do not mow lawns or spend time around freshly cut grass; mowing stirs up pollen. Keep windows closed at night.  Keep car windows closed while driving. Minimize morning activities outdoors, a time when pollen counts are usually at their highest. Stay indoors as much as possible when pollen counts or humidity is high and on windy days when pollen tends to remain in the air longer. Use air conditioning when possible.  Many air conditioners have filters that trap the pollen spores. Use a HEPA room air filter to remove pollen form the indoor air you breathe.  DUST MITE AVOIDANCE MEASURES:  There are three main measures that need and can be taken to avoid house dust mites:  Reduce accumulation of dust in general -reduce furniture, clothing, carpeting, books, stuffed animals, especially in bedroom  Separate yourself from the dust -use pillow and mattress encasements (can be found at stores such as [employer] or  online) -avoid direct exposure to air condition flow -use a HEPA filter device, especially in the bedroom; you can also use a HEPA filter vacuum cleaner -wipe dust with a moist  towel instead of a dry towel or broom when cleaning  Decrease mites and/or their secretions -wash clothing and linen and stuffed animals at highest temperature possible, at least every 2 weeks -stuffed animals can also be placed in a bag and put in a freezer overnight  Despite the above measures, it is impossible to eliminate dust mites or their allergen completely from your home.  With the above measures the burden of mites in your home can be diminished, with the goal of minimizing your allergic symptoms.  Success will be reached only when implementing and using all means together.

## 2021-10-29 LAB — TRYPTASE: Tryptase: 5 ug/L (ref 2.2–13.2)

## 2021-10-29 LAB — CBC WITH DIFFERENTIAL/PLATELET
Basophils Absolute: 0 10*3/uL (ref 0.0–0.2)
Basos: 0 %
EOS (ABSOLUTE): 0.1 10*3/uL (ref 0.0–0.4)
Eos: 3 %
Hematocrit: 35.5 % (ref 34.0–46.6)
Hemoglobin: 10.7 g/dL — ABNORMAL LOW (ref 11.1–15.9)
Immature Grans (Abs): 0 10*3/uL (ref 0.0–0.1)
Immature Granulocytes: 0 %
Lymphocytes Absolute: 1 10*3/uL (ref 0.7–3.1)
Lymphs: 26 %
MCH: 25.2 pg — ABNORMAL LOW (ref 26.6–33.0)
MCHC: 30.1 g/dL — ABNORMAL LOW (ref 31.5–35.7)
MCV: 84 fL (ref 79–97)
Monocytes Absolute: 0.3 10*3/uL (ref 0.1–0.9)
Monocytes: 8 %
Neutrophils Absolute: 2.4 10*3/uL (ref 1.4–7.0)
Neutrophils: 63 %
Platelets: 210 10*3/uL (ref 150–450)
RBC: 4.25 x10E6/uL (ref 3.77–5.28)
RDW: 14.3 % (ref 11.7–15.4)
WBC: 3.8 10*3/uL (ref 3.4–10.8)

## 2021-10-29 LAB — ALPHA-GAL PANEL
Allergen Lamb IgE: <0.1 kU/L
Beef IgE: 0.12 kU/L — AB
IgE (Immunoglobulin E), Serum: 830 IU/mL — ABNORMAL HIGH (ref 6–495)
O215-IgE Alpha-Gal: <0.1 kU/L
Pork IgE: 0.28 kU/L — AB

## 2021-11-03 ENCOUNTER — Inpatient Hospital Stay: Payer: Medicare Other | Attending: Gynecologic Oncology

## 2021-11-03 ENCOUNTER — Encounter (HOSPITAL_COMMUNITY): Payer: Self-pay

## 2021-11-03 ENCOUNTER — Other Ambulatory Visit: Payer: Self-pay

## 2021-11-03 ENCOUNTER — Ambulatory Visit (HOSPITAL_COMMUNITY)
Admission: RE | Admit: 2021-11-03 | Discharge: 2021-11-03 | Disposition: A | Discharge status: Home / Self Care | Payer: Medicare Other | Source: Ambulatory Visit | Attending: Hematology and Oncology | Admitting: Hematology and Oncology

## 2021-11-03 ENCOUNTER — Other Ambulatory Visit (INDEPENDENT_AMBULATORY_CARE_PROVIDER_SITE_OTHER): Payer: Medicare Other

## 2021-11-03 DIAGNOSIS — C541 Malignant neoplasm of endometrium: Secondary | ICD-10-CM | POA: Insufficient documentation

## 2021-11-03 DIAGNOSIS — C787 Secondary malignant neoplasm of liver and intrahepatic bile duct: Secondary | ICD-10-CM | POA: Insufficient documentation

## 2021-11-03 DIAGNOSIS — E039 Hypothyroidism, unspecified: Secondary | ICD-10-CM | POA: Diagnosis not present

## 2021-11-03 DIAGNOSIS — K7689 Other specified diseases of liver: Secondary | ICD-10-CM | POA: Diagnosis not present

## 2021-11-03 DIAGNOSIS — R590 Localized enlarged lymph nodes: Secondary | ICD-10-CM | POA: Insufficient documentation

## 2021-11-03 DIAGNOSIS — I700 Atherosclerosis of aorta: Secondary | ICD-10-CM | POA: Diagnosis not present

## 2021-11-03 DIAGNOSIS — Z7189 Other specified counseling: Secondary | ICD-10-CM

## 2021-11-03 DIAGNOSIS — K5730 Diverticulosis of large intestine without perforation or abscess without bleeding: Secondary | ICD-10-CM | POA: Diagnosis not present

## 2021-11-03 LAB — CBC WITH DIFFERENTIAL (CANCER CENTER ONLY)
Abs Immature Granulocytes: 0.01 10*3/uL (ref 0.00–0.07)
Basophils Absolute: 0 10*3/uL (ref 0.0–0.1)
Basophils Relative: 1 %
Eosinophils Absolute: 0.2 10*3/uL (ref 0.0–0.5)
Eosinophils Relative: 5 %
HCT: 31.2 % — ABNORMAL LOW (ref 36.0–46.0)
Hemoglobin: 10.1 g/dL — ABNORMAL LOW (ref 12.0–15.0)
Immature Granulocytes: 0 %
Lymphocytes Relative: 31 %
Lymphs Abs: 1 10*3/uL (ref 0.7–4.0)
MCH: 24.5 pg — ABNORMAL LOW (ref 26.0–34.0)
MCHC: 32.4 g/dL (ref 30.0–36.0)
MCV: 75.5 fL — ABNORMAL LOW (ref 80.0–100.0)
Monocytes Absolute: 0.3 10*3/uL (ref 0.1–1.0)
Monocytes Relative: 10 %
Neutro Abs: 1.8 10*3/uL (ref 1.7–7.7)
Neutrophils Relative %: 53 %
Platelet Count: 195 10*3/uL (ref 150–400)
RBC: 4.13 MIL/uL (ref 3.87–5.11)
RDW: 14.6 % (ref 11.5–15.5)
WBC Count: 3.3 10*3/uL — ABNORMAL LOW (ref 4.0–10.5)
nRBC: 0 % (ref 0.0–0.2)

## 2021-11-03 LAB — CMP (CANCER CENTER ONLY)
ALT: 13 U/L (ref 0–44)
AST: 19 U/L (ref 15–41)
Albumin: 3.4 g/dL — ABNORMAL LOW (ref 3.5–5.0)
Alkaline Phosphatase: 81 U/L (ref 38–126)
Anion gap: 9 (ref 5–15)
BUN: 10 mg/dL (ref 8–23)
CO2: 27 mmol/L (ref 22–32)
Calcium: 9 mg/dL (ref 8.9–10.3)
Chloride: 105 mmol/L (ref 98–111)
Creatinine: 0.76 mg/dL (ref 0.44–1.00)
GFR, Estimated: >60 mL/min (ref >60)
Glucose, Bld: 95 mg/dL (ref 70–99)
Potassium: 4 mmol/L (ref 3.5–5.1)
Sodium: 141 mmol/L (ref 135–145)
Total Bilirubin: 0.5 mg/dL (ref 0.3–1.2)
Total Protein: 6.5 g/dL (ref 6.5–8.1)

## 2021-11-03 LAB — T4, FREE: Free T4: 1.25 ng/dL (ref 0.60–1.60)

## 2021-11-03 LAB — TSH: TSH: <0.01 u[IU]/mL — ABNORMAL LOW (ref 0.35–5.50)

## 2021-11-03 MED ORDER — HEPARIN SOD (PORK) LOCK FLUSH 100 UNIT/ML IV SOLN
INTRAVENOUS | Status: AC
  Filled 2021-11-03: qty 5

## 2021-11-03 MED ORDER — HEPARIN SOD (PORK) LOCK FLUSH 100 UNIT/ML IV SOLN: 500.0000 [IU] | Freq: Once | INTRAVENOUS | Status: DC

## 2021-11-03 MED ORDER — HEPARIN SOD (PORK) LOCK FLUSH 100 UNIT/ML IV SOLN
500.0000 [IU] | Freq: Once | INTRAVENOUS | Status: AC
  Administered 2021-11-03: 500 [IU] via INTRAVENOUS

## 2021-11-03 MED ORDER — SODIUM CHLORIDE 0.9% FLUSH
10.0000 mL | Freq: Once | INTRAVENOUS | Status: AC
  Administered 2021-11-03: 10 mL

## 2021-11-06 ENCOUNTER — Other Ambulatory Visit: Payer: Self-pay | Admitting: Internal Medicine

## 2021-11-06 ENCOUNTER — Encounter: Payer: Self-pay | Admitting: Internal Medicine

## 2021-11-06 ENCOUNTER — Encounter: Payer: Self-pay | Admitting: Hematology and Oncology

## 2021-11-06 ENCOUNTER — Other Ambulatory Visit: Payer: Self-pay

## 2021-11-06 ENCOUNTER — Other Ambulatory Visit (HOSPITAL_COMMUNITY): Payer: Self-pay

## 2021-11-06 ENCOUNTER — Inpatient Hospital Stay (HOSPITAL_BASED_OUTPATIENT_CLINIC_OR_DEPARTMENT_OTHER): Payer: Medicare Other | Admitting: Hematology and Oncology

## 2021-11-06 VITALS — BP 136/72 | HR 73 | Temp 98.3°F | Resp 18 | Ht 64.0 in | Wt 193.6 lb

## 2021-11-06 DIAGNOSIS — R590 Localized enlarged lymph nodes: Secondary | ICD-10-CM | POA: Diagnosis not present

## 2021-11-06 DIAGNOSIS — C787 Secondary malignant neoplasm of liver and intrahepatic bile duct: Secondary | ICD-10-CM | POA: Diagnosis not present

## 2021-11-06 DIAGNOSIS — C541 Malignant neoplasm of endometrium: Secondary | ICD-10-CM

## 2021-11-06 MED ORDER — LEVOTHYROXINE SODIUM 50 MCG PO TABS
50.0000 ug | ORAL_TABLET | Freq: Every day | ORAL | 3 refills | Status: DC
  Filled 2021-11-06: qty 90, 90d supply, fill #0

## 2021-11-06 NOTE — Assessment & Plan Note (Signed)
We have reviewed numerous CT imaging The last 2 CT imaging were done without contrast There are limitation of interpretation of these images Overall, we have stability of the pelvic lymphadenopathy with no other signs of cancer progression The patient is asymptomatic and would like to continue active surveillance with serial imaging study I plan to order repeat imaging study in 3 months

## 2021-11-06 NOTE — Progress Notes (Signed)
[place] OFFICE PROGRESS NOTE  Patient Care Team: Tina Lima, MD as PCP - General (Internal Medicine) Tina Crome, MD as PCP - Cardiology (Cardiology) Tina Filler, MD  ASSESSMENT & PLAN:  Endometrial cancer [facility] We have reviewed numerous CT imaging The last 2 CT imaging were done without contrast There are limitation of interpretation of these images Overall, we have stability of the pelvic lymphadenopathy with no other signs of cancer progression The patient is asymptomatic and would like to continue active surveillance with serial imaging study I plan to order repeat imaging study in 3 months  Metastasis to liver [facility]) It is impossible to interpret CT imaging without IV contrast but so far no definitive sign of disease is seen in her liver  Orders Placed This Encounter  Procedures   CT Abdomen Pelvis Wo Contrast    Standing Status:   Future    Standing Expiration Date:   11/06/2022    Order Specific Question:   Preferred imaging location?    Answer:   [facility]    Order Specific Question:   Is Oral Contrast requested for this exam?    Answer:   Yes, Per Radiology protocol    All questions were answered. The patient knows to call the clinic with any problems, questions or concerns. The total time spent in the appointment was 20 minutes encounter with patients including review of chart and various tests results, discussions about plan of care and coordination of care plan   Tina Lark, MD 11/06/2021 11:25 AM  INTERVAL HISTORY: Please see below for problem oriented charting. she returns for follow-up on recent surveillance imaging study for history of recurrent uterine cancer She is doing well Denies pelvic pain No recent bleeding  REVIEW OF SYSTEMS:   Constitutional: Denies fevers, chills or abnormal weight loss Eyes: Denies blurriness of vision Ears, nose, mouth, throat, and face: Denies mucositis or sore  throat Respiratory: Denies cough, dyspnea or wheezes Cardiovascular: Denies palpitation, chest discomfort or lower extremity swelling Gastrointestinal:  Denies nausea, heartburn or change in bowel habits Skin: Denies abnormal skin rashes Lymphatics: Denies new lymphadenopathy or easy bruising Neurological:Denies numbness, tingling or new weaknesses Behavioral/Psych: Mood is stable, no new changes  All other systems were reviewed with the patient and are negative.  I have reviewed the past medical history, past surgical history, social history and family history with the patient and they are unchanged from previous note.  ALLERGIES:  is allergic to benicar hct [olmesartan medoxomil-hctz], iodinated diagnostic agents, bactrim [sulfamethoxazole-trimethoprim], pravastatin, shellfish allergy, and amlodipine.  MEDICATIONS:  Current Outpatient Medications  Medication Sig Dispense Refill   acetaminophen (TYLENOL) 500 MG tablet Take 1,000 mg by mouth every 6 (six) hours as needed for moderate pain.     apixaban (ELIQUIS) 5 MG TABS tablet Take 1 tablet (5 mg total) by mouth 2 (two) times daily. 180 tablet 1   APPLE CIDER VINEGAR PO Take 5 mLs by mouth daily.      atenolol (TENORMIN) 25 MG tablet Take 1 tablet (25 mg total) by mouth daily. 90 tablet 1   cetirizine (ZYRTEC ALLERGY) 10 MG tablet Take 1 tablet twice daily.  If hives not controlled, increase to 2 tablets twice daily.  Do this until hives free x 3 weeks, then can start decreasing 1 tablet every 3-5 days until off. 120 tablet 4   Cholecalciferol (VITAMIN D3) 50 MCG (2000 UT) TABS Take by mouth.     EPINEPHrine (EPIPEN 2-PAK)  0.3 mg/0.3 mL IJ SOAJ injection Inject 0.3 mg into the muscle as needed for anaphylaxis. 1 each 2   levocetirizine (XYZAL) 5 MG tablet Take 1 tablet (5 mg total) by mouth every evening. 90 tablet 1   levothyroxine (SYNTHROID) 100 MCG tablet Take 100 mcg by mouth daily before breakfast.     levothyroxine (SYNTHROID) 50  MCG tablet Take 1 tablet (50 mcg total) by mouth daily. 90 tablet 3   Multiple Vitamin (MULTIVITAMIN) capsule Take 1 capsule by mouth daily.     polyethylene glycol (MIRALAX / GLYCOLAX) 17 g packet Take 17 g by mouth daily as needed for mild constipation.     triamcinolone cream (KENALOG) 0.1 % Apply 1 application topically 2 (two) times daily. 30 g 1   No current facility-administered medications for this visit.    SUMMARY OF ONCOLOGIC HISTORY: Oncology History Overview Note  Poorly differentiated carcinoma, mixed histology with squamous differentiation, rare focus of clear cells as well as serous features MSI stable Her2 negative   Endometrial cancer ([place])  09/01/2019 Initial Diagnosis   The patient reported a history of postmenopausal bleeding that began 1 to 2 months before diagnosis   09/14/2019 Imaging   US pelvis 1. Enlarged uterus with numerous myometrial masses, presumably fibroids. 2. Endometrial thickness of 6.2 mm. In the setting of post-menopausal bleeding, endometrial sampling is indicated to exclude carcinoma. If results are benign, sonohysterogram should be considered for focal lesion work-up.  3. Nonvisualized ovaries   09/23/2019 Pathology Results   A. ENDOMETRIUM, BIOPSY:  - Poorly differentiated carcinoma   10/15/2019 Imaging   Ct scan of chest, abdomen and pelvis: No evidence of metastatic disease within the chest, abdomen, or pelvis.   Enlarged fibroid uterus.   Colonic diverticulosis. No radiographic evidence of diverticulitis.   Aortic and coronary artery atherosclerosis.     10/22/2019 Pathology Results   SURGICAL PATHOLOGY   FINAL MICROSCOPIC DIAGNOSIS:   A. UTERUS, BILATERAL TUBES AND OVARIES, HYSTERECTOMY:  Poorly differentiated carcinoma, 6.5 cm.  Lymphovascular involvement by tumor.  Carcinoma involves inner half of the myometrium.  Margins not involved.  Cervix, bilateral fallopian tubes and bilateral ovaries free of tumor.   B. LYMPH  NODE, RIGHT EXTERNAL SENTINEL, BIOPSY:  One lymph node with no metastatic carcinoma (0/1).   C. LYMPH NODE, RIGHT PELVIC, BIOPSY  Five lymph nodes with no metastatic carcinoma (0/5).   D. LYMPH NODE, LEFT PELVIC, BIOPSY:  Five lymph nodes with no metastatic carcinoma (0/5).   E. LYMPH NODE, RIGHT PERI AORTIC, BIOPSY:  One lymph node with no metastatic carcinoma (0/1).   F. LYMPH NODE, LEFT PERI AORTIC, BIOPSY:  Five lymph nodes with no metastatic carcinoma (0/5).    ONCOLOGY TABLE:  UTERUS, CARCINOMA OR CARCINOSARCOMA   Procedure: Total hysterectomy with bilateral f-oophorectomy and sentinel  lymph nodes.  Histologic type: Poorly differentiated carcinoma, see comment.  Histologic Grade: High-grade, FIGO 3.  Myometrial invasion:       Depth of invasion: 13 mm       Myometrial thickness: 40 mm  Uterine Serosa Involvement: Not identified  Cervical stromal involvement: Not identified  Extent of involvement of other organs: Not identified  Lymphovascular invasion: Present  Regional Lymph Nodes:       Examined:     17 Sentinel                               0 non-sentinel  17 total        Lymph nodes with metastasis: 0        Isolated tumor cells (<0.2 mm): 0        Micrometastasis:  (>0.2 mm and < 2.0 mm): 0        Macrometastasis: (>2.0 mm): 0  Representative Tumor Block: A5, A6, A7 and A8.  MMR / MSI testing: Pending  Pathologic Stage Classification (pTNM, AJCC 8th edition):  pT1a, pN0  Comments: The carcinoma is a high-grade poorly differentiated carcinoma which morphologically has predominantly serous features.  There are a few foci with squamous differentiation and a rare focus of clear cell features.  Immunohistochemistry for cytokeratin AE1/AE3 is performed on the sentinel lymph nodes and no positivity is identified.    10/22/2019 Surgery   Pre-operative Diagnosis: endometrial cancer grade 3   Post-operative Diagnosis: same,    Operation:  Robotic-assisted laparoscopic total hysterectomy for uterus >250gm with bilateral salpingoophorectomy, SLN mapping, bilateral pelvic and para-aortic lymphadenectomy.   Surgeon: Donaciano Eva    Operative Findings:  : 16cm bulky fibroid uterus, normal ovaries bilaterally, no suspicious lymph nodes.     11/10/2019 Cancer Staging   Staging form: Corpus Uteri - Carcinoma and Carcinosarcoma, AJCC 8th Edition - Pathologic: Stage IVB (pT1a, pN0, cM1) - Signed by Tina Lark, MD on 05/16/2020    05/10/2020 Imaging   1. New hypodense 2.0 cm segment 4A left liver lobe mass, suspicious for hepatic metastasis. 2. New left pelvic sidewall 1.4 cm soft tissue nodule, suspicious for left internal iliac nodal metastasis. 3. New left vaginal cuff 1.6 x 1.3 cm soft tissue nodule, suspicious for recurrent tumor. 4. Aortic Atherosclerosis (ICD10-I70.0).   05/18/2020 Procedure   Successful placement of a right internal jugular approach power injectable Port-A-Cath. The catheter is ready for immediate use.   05/23/2020 -  Chemotherapy   The patient had carboplatin and taxol for chemotherapy treatment.     07/28/2020 Imaging   1. No findings identified to suggest recurrent or metastatic disease. 2. Indeterminate and slightly exophytic lesion arising from the inferior pole of left kidney measures 0.8 cm. Technically this is too small to reliably characterize. Attention on follow-up imaging is advised. 3. Aortic atherosclerosis and LAD coronary artery atherosclerotic calcifications.   11/07/2020 Imaging   1. Interval progression of right pelvic sidewall lymphadenopathy, highly concerning for disease progression. PET-CT may prove helpful to further evaluate as clinically warranted. 2. Interval resolution of the previously identified hypodense lesion in segment IVA of the liver. 3. Stable 7 mm subcapsular lesion in the lower pole left kidney with attenuation higher than would be expected for a simple cyst but  is too small to reliably characterize. Attention on follow-up recommended. 4. Aortic Atherosclerosis (ICD10-I70.0).   11/16/2020 - 01/04/2021 Chemotherapy   She received Beryle Flock plus Lenvima       02/02/2021 Imaging   1. Significant interval decrease in size in right pelvic sidewall, iliac, and inguinal lymph nodes, consistent with treatment response of nodal metastatic disease. 2. Multiple low-attenuation lesions throughout the liver, unchanged compared to prior examination, the majority of these consistent with simple cysts. A previously noted metastatic lesion of anterior hepatic segment VII remains resolved. No new lesions. Findings are consistent with sustained treatment response of hepatic metastatic disease. 3. Status post hysterectomy. Unchanged post treatment appearance of left vaginal cuff and pelvic sidewall soft tissue, consistent with sustained treatment response. 4. No evidence of metastatic disease in the chest.   Aortic Atherosclerosis (ICD10-I70.0).  05/17/2021 Imaging   Mild right external iliac lymphadenopathy, without significant change. No new or progressive disease within the abdomen or pelvis.   Colonic diverticulosis. No radiographic evidence of diverticulitis.   Aortic Atherosclerosis (ICD10-I70.0).   11/03/2021 Imaging   Stable mild right external iliac lymphadenopathy. No new or progressive disease within the abdomen or pelvis.   Colonic diverticulosis. No radiographic evidence of diverticulitis.   Aortic Atherosclerosis (ICD10-I70.0).       PHYSICAL EXAMINATION: ECOG PERFORMANCE STATUS: 0 - Asymptomatic  Vitals:   11/06/21 0755  BP: 136/72  Pulse: 73  Resp: 18  Temp: 98.3 F (36.8 C)  SpO2: 100%   Filed Weights   11/06/21 0755  Weight: 193 lb 9.6 oz (87.8 kg)    GENERAL:alert, no distress and comfortable NEURO: alert & oriented x 3 with fluent speech, no focal motor/sensory deficits  LABORATORY DATA:  I have reviewed the data as  listed    Component Value Date/Time   NA 141 11/03/2021 0737   NA 140 07/04/2017 0952   K 4.0 11/03/2021 0737   CL 105 11/03/2021 0737   CO2 27 11/03/2021 0737   GLUCOSE 95 11/03/2021 0737   BUN 10 11/03/2021 0737   BUN 10 07/04/2017 0952   CREATININE 0.76 11/03/2021 0737   CREATININE 0.81 03/23/2015 0946   CALCIUM 9.0 11/03/2021 0737   PROT 6.5 11/03/2021 0737   ALBUMIN 3.4 (L) 11/03/2021 0737   AST 19 11/03/2021 0737   ALT 13 11/03/2021 0737   ALKPHOS 81 11/03/2021 0737   BILITOT 0.5 11/03/2021 0737   GFRNONAA >60 11/03/2021 0737   GFRNONAA 78 03/23/2015 0946   GFRAA >60 09/12/2020 0853   GFRAA >89 03/23/2015 0946    No results found for: SPEP, UPEP  Lab Results  Component Value Date   WBC 3.3 (L) 11/03/2021   NEUTROABS 1.8 11/03/2021   HGB 10.1 (L) 11/03/2021   HCT 31.2 (L) 11/03/2021   MCV 75.5 (L) 11/03/2021   PLT 195 11/03/2021      Chemistry      Component Value Date/Time   NA 141 11/03/2021 0737   NA 140 07/04/2017 0952   K 4.0 11/03/2021 0737   CL 105 11/03/2021 0737   CO2 27 11/03/2021 0737   BUN 10 11/03/2021 0737   BUN 10 07/04/2017 0952   CREATININE 0.76 11/03/2021 0737   CREATININE 0.81 03/23/2015 0946      Component Value Date/Time   CALCIUM 9.0 11/03/2021 0737   ALKPHOS 81 11/03/2021 0737   AST 19 11/03/2021 0737   ALT 13 11/03/2021 0737   BILITOT 0.5 11/03/2021 0737       RADIOGRAPHIC STUDIES: I have reviewed multiple imaging study with the patient I have personally reviewed the radiological images as listed and agreed with the findings in the report. CT Abdomen Pelvis Wo Contrast  Result Date: 11/03/2021 CLINICAL DATA:  Follow-up endometrial carcinoma. Previous chemotherapy. Surveillance EXAM: CT ABDOMEN AND PELVIS WITHOUT CONTRAST TECHNIQUE: Multidetector CT imaging of the abdomen and pelvis was performed following the standard protocol without IV contrast. COMPARISON:  05/17/2021 FINDINGS: Lower chest: No acute findings.  Hepatobiliary: Multiple small fluid attenuation hepatic cysts remain stable. No mass visualized on this unenhanced exam. Prior cholecystectomy. No evidence of biliary obstruction. Pancreas: No mass or inflammatory process visualized on this unenhanced exam. Spleen:  Within normal limits in size. Adrenals/Urinary tract: No evidence of urolithiasis or hydronephrosis. Unremarkable unopacified urinary bladder. Stomach/Bowel: No evidence of obstruction, inflammatory process, or abnormal fluid collections. Diverticulosis  is seen mainly involving the sigmoid colon, however there is no evidence of diverticulitis. Vascular/Lymphatic: Mild lymphadenopathy is again seen in the right external iliac lymph node chain, largest measuring 1 point 7 cm short axis on image 62/2. No other sites of lymphadenopathy identified. No evidence of abdominal aortic aneurysm. Aortic atherosclerotic calcification noted. Reproductive: Prior hysterectomy noted. Adnexal regions are unremarkable in appearance. Other:  None. Musculoskeletal:  No suspicious bone lesions identified. IMPRESSION: Stable mild right external iliac lymphadenopathy. No new or progressive disease within the abdomen or pelvis. Colonic diverticulosis. No radiographic evidence of diverticulitis. Aortic Atherosclerosis (ICD10-I70.0). Electronically Signed   By: Marlaine Hind M.D.   On: 11/03/2021 13:31

## 2021-11-06 NOTE — Assessment & Plan Note (Signed)
It is impossible to interpret CT imaging without IV contrast but so far no definitive sign of disease is seen in her liver

## 2021-11-14 ENCOUNTER — Encounter: Payer: Self-pay | Admitting: Internal Medicine

## 2021-11-14 ENCOUNTER — Other Ambulatory Visit: Payer: Self-pay

## 2021-11-14 ENCOUNTER — Ambulatory Visit (INDEPENDENT_AMBULATORY_CARE_PROVIDER_SITE_OTHER): Payer: Medicare Other | Admitting: Internal Medicine

## 2021-11-14 VITALS — BP 132/76 | HR 63 | Temp 98.5°F | Resp 16 | Ht 64.0 in | Wt 193.0 lb

## 2021-11-14 DIAGNOSIS — E039 Hypothyroidism, unspecified: Secondary | ICD-10-CM

## 2021-11-14 DIAGNOSIS — R7303 Prediabetes: Secondary | ICD-10-CM

## 2021-11-14 DIAGNOSIS — I10 Essential (primary) hypertension: Secondary | ICD-10-CM

## 2021-11-14 DIAGNOSIS — E785 Hyperlipidemia, unspecified: Secondary | ICD-10-CM

## 2021-11-14 DIAGNOSIS — I480 Paroxysmal atrial fibrillation: Secondary | ICD-10-CM

## 2021-11-14 LAB — LIPID PANEL
Cholesterol: 195 mg/dL (ref 0–200)
HDL: 66.9 mg/dL (ref >39.00)
LDL Cholesterol: 111 mg/dL — ABNORMAL HIGH (ref 0–99)
NonHDL: 128.32
Total CHOL/HDL Ratio: 3
Triglycerides: 88 mg/dL (ref 0.0–149.0)
VLDL: 17.6 mg/dL (ref 0.0–40.0)

## 2021-11-14 LAB — HEMOGLOBIN A1C: Hgb A1c MFr Bld: 4.8 % (ref 4.6–6.5)

## 2021-11-14 NOTE — Patient Instructions (Signed)

## 2021-11-14 NOTE — Progress Notes (Signed)
Subjective:  Patient ID: Tina Stone, female    DOB: 12/29/1950  Age: 71 y.o. MRN: [id number]  CC: Hypertension and Hypothyroidism  This visit occurred during the SARS-CoV-2 public health emergency.  Safety protocols were in place, including screening questions prior to the visit, additional usage of staff PPE, and extensive cleaning of exam room while observing appropriate contact time as indicated for disinfecting solutions.    HPI Tina Stone presents for f/up -   She recently saw someone else and her TSH was suppressed so her T4 dosage was decreased.  She has intermittent rapid palpitations but has had no palpitations for 2 weeks now.  Outpatient Medications Prior to Visit  Medication Sig Dispense Refill   acetaminophen (TYLENOL) 500 MG tablet Take 1,000 mg by mouth every 6 (six) hours as needed for moderate pain.     apixaban (ELIQUIS) 5 MG TABS tablet Take 1 tablet (5 mg total) by mouth 2 (two) times daily. 180 tablet 1   APPLE CIDER VINEGAR PO Take 5 mLs by mouth daily.      atenolol (TENORMIN) 25 MG tablet Take 1 tablet (25 mg total) by mouth daily. 90 tablet 1   cetirizine (ZYRTEC ALLERGY) 10 MG tablet Take 1 tablet twice daily.  If hives not controlled, increase to 2 tablets twice daily.  Do this until hives free x 3 weeks, then can start decreasing 1 tablet every 3-5 days until off. 120 tablet 4   Cholecalciferol (VITAMIN D3) 50 MCG (2000 UT) TABS Take by mouth.     EPINEPHrine (EPIPEN 2-PAK) 0.3 mg/0.3 mL IJ SOAJ injection Inject 0.3 mg into the muscle as needed for anaphylaxis. 1 each 2   levocetirizine (XYZAL) 5 MG tablet Take 1 tablet (5 mg total) by mouth every evening. 90 tablet 1   levothyroxine (SYNTHROID) 100 MCG tablet Take 100 mcg by mouth daily before breakfast.     levothyroxine (SYNTHROID) 50 MCG tablet Take 1 tablet (50 mcg total) by mouth daily. 90 tablet 3   Multiple Vitamin (MULTIVITAMIN) capsule Take 1 capsule by mouth daily.     polyethylene  glycol (MIRALAX / GLYCOLAX) 17 g packet Take 17 g by mouth daily as needed for mild constipation.     triamcinolone cream (KENALOG) 0.1 % Apply 1 application topically 2 (two) times daily. 30 g 1   No facility-administered medications prior to visit.    ROS Review of Systems  Constitutional:  Negative for diaphoresis and fatigue.  HENT: Negative.    Eyes: Negative.  Negative for visual disturbance.  Respiratory:  Negative for cough, chest tightness, shortness of breath and wheezing.   Cardiovascular:  Positive for palpitations. Negative for chest pain and leg swelling.  Gastrointestinal:  Negative for abdominal pain, constipation, diarrhea, nausea and vomiting.  Endocrine: Negative for cold intolerance and heat intolerance.  Genitourinary: Negative.  Negative for difficulty urinating.  Musculoskeletal: Negative.   Skin: Negative.   Neurological: Negative.  Negative for dizziness, weakness, light-headedness and headaches.  Hematological:  Negative for adenopathy. Does not bruise/bleed easily.  Psychiatric/Behavioral:  Positive for confusion. Negative for sleep disturbance. The patient is not nervous/anxious.    Objective:  BP 132/76 (BP Location: Right Arm, Patient Position: Sitting, Cuff Size: Large)   Pulse 63   Temp 98.5 F (36.9 C) (Oral)   Resp 16   Ht 5\' 4"  (1.626 m)   Wt 193 lb (87.5 kg)   SpO2 97%   BMI 33.13 kg/m   BP Readings from Last  3 Encounters:  11/14/21 132/76  11/06/21 136/72  10/20/21 118/72    Wt Readings from Last 3 Encounters:  11/14/21 193 lb (87.5 kg)  11/06/21 193 lb 9.6 oz (87.8 kg)  10/20/21 193 lb (87.5 kg)    Physical Exam Vitals reviewed.  Constitutional:      Appearance: Normal appearance.  HENT:     Nose: Nose normal.     Mouth/Throat:     Mouth: Mucous membranes are moist.  Eyes:     General: No scleral icterus.    Conjunctiva/sclera: Conjunctivae normal.  Cardiovascular:     Rate and Rhythm: Normal rate and regular rhythm.      Heart sounds: No murmur heard. Pulmonary:     Effort: Pulmonary effort is normal.     Breath sounds: No stridor. No wheezing, rhonchi or rales.  Abdominal:     General: Abdomen is flat.     Palpations: There is no mass.     Tenderness: There is no abdominal tenderness. There is no guarding.     Hernia: No hernia is present.  Musculoskeletal:        General: Normal range of motion.     Cervical back: Neck supple.     Right lower leg: No edema.     Left lower leg: No edema.  Lymphadenopathy:     Cervical: No cervical adenopathy.  Skin:    General: Skin is warm and dry.     Coloration: Skin is not pale.  Neurological:     General: No focal deficit present.     Mental Status: She is alert.  Psychiatric:        Attention and Perception: She is inattentive.        Mood and Affect: Mood and affect normal.        Speech: Speech is tangential. Speech is not delayed.        Behavior: Behavior normal.        Thought Content: Thought content normal.        Cognition and Memory: Cognition normal. Memory is impaired.    Lab Results  Component Value Date   WBC 3.3 (L) 11/03/2021   HGB 10.1 (L) 11/03/2021   HCT 31.2 (L) 11/03/2021   PLT 195 11/03/2021   GLUCOSE 95 11/03/2021   CHOL 195 11/14/2021   TRIG 88.0 11/14/2021   HDL 66.90 11/14/2021   LDLCALC 111 (H) 11/14/2021   ALT 13 11/03/2021   AST 19 11/03/2021   NA 141 11/03/2021   K 4.0 11/03/2021   CL 105 11/03/2021   CREATININE 0.76 11/03/2021   BUN 10 11/03/2021   CO2 27 11/03/2021   TSH <0.01 Repeated and verified X2. (L) 11/03/2021   INR 1.0 01/28/2021   HGBA1C 4.8 11/14/2021    CT Abdomen Pelvis Wo Contrast  Result Date: 11/03/2021 CLINICAL DATA:  Follow-up endometrial carcinoma. Previous chemotherapy. Surveillance EXAM: CT ABDOMEN AND PELVIS WITHOUT CONTRAST TECHNIQUE: Multidetector CT imaging of the abdomen and pelvis was performed following the standard protocol without IV contrast. COMPARISON:  05/17/2021 FINDINGS:  Lower chest: No acute findings. Hepatobiliary: Multiple small fluid attenuation hepatic cysts remain stable. No mass visualized on this unenhanced exam. Prior cholecystectomy. No evidence of biliary obstruction. Pancreas: No mass or inflammatory process visualized on this unenhanced exam. Spleen:  Within normal limits in size. Adrenals/Urinary tract: No evidence of urolithiasis or hydronephrosis. Unremarkable unopacified urinary bladder. Stomach/Bowel: No evidence of obstruction, inflammatory process, or abnormal fluid collections. Diverticulosis is seen mainly involving the  sigmoid colon, however there is no evidence of diverticulitis. Vascular/Lymphatic: Mild lymphadenopathy is again seen in the right external iliac lymph node chain, largest measuring 1 point 7 cm short axis on image 62/2. No other sites of lymphadenopathy identified. No evidence of abdominal aortic aneurysm. Aortic atherosclerotic calcification noted. Reproductive: Prior hysterectomy noted. Adnexal regions are unremarkable in appearance. Other:  None. Musculoskeletal:  No suspicious bone lesions identified. IMPRESSION: Stable mild right external iliac lymphadenopathy. No new or progressive disease within the abdomen or pelvis. Colonic diverticulosis. No radiographic evidence of diverticulitis. Aortic Atherosclerosis (ICD10-I70.0). Electronically Signed   By: Marlaine Hind M.D.   On: 11/03/2021 13:31    Assessment & Plan:   Aurelia was seen today for hypertension and hypothyroidism.  Diagnoses and all orders for this visit:  Essential hypertension- Her blood pressure is adequately well controlled.  Paroxysmal atrial fibrillation ([place])- I recommended that she undergo an event monitor and to continue taking the DOAC. -     CARDIAC EVENT MONITOR; Future  Acquired hypothyroidism- Will recheck her TSH in 2 months.  Prediabetes- Her A1c is normal. -     Hemoglobin A1c; Future -     Hemoglobin A1c  Hyperlipidemia with target LDL less  than 100- Statin therapy is not indicated. -     Lipid panel; Future -     Lipid panel  I am having Jasman E. Nickolson maintain her APPLE CIDER VINEGAR PO, multivitamin, acetaminophen, polyethylene glycol, Vitamin D3, apixaban, atenolol, levocetirizine, triamcinolone cream, EPINEPHrine, levothyroxine, cetirizine, and levothyroxine.  No orders of the defined types were placed in this encounter.    Follow-up: Return in about 3 months (around 02/14/2022).  Scarlette Calico, MD

## 2021-11-15 ENCOUNTER — Encounter: Payer: Self-pay | Admitting: Internal Medicine

## 2021-11-21 NOTE — Progress Notes (Signed)
FOLLOW UP Date of Service/Encounter:  11/22/21   Subjective:  Tina Stone (DOB: 05/14/1950) is a 71 y.o. female PMHx of endometrial cancer with liver metastasis, hypertension, atrial fibrillation on chronic anticoagulation, acquired hypothryoidsim, prediabetes, GERD, vimtain D deficiency, hyperlipdemia who returns to the [facility] on 11/22/2021 in re-evaluation of the following: chronic idiopathic urticaria History obtained from: chart review and patient.  For Review, LV was on 10/20/2021 with Dr.Jaire Pinkham seen for chronic hives.  Patient with known hypothyroidism.  We discussed high-dose antihistamine therapy for hive control. She also had concern for food allergy and did have positive IgE testing to peanut, wheat, walnut, soy, sesame, hazelnut and almonds.  However she is eating and tolerating wheat and soy as well as cashews and pistachios so we advised her to continue to do so. We did advise that she avoid shellfish, tree nuts, peanuts, and sesame since she does report stomachache and nausea with shellfish and sometimes almonds although eating honey nut Cheerios on a regular basis without symptoms.  Her skin testing to top 8 allergens and mushrooms were all negative.    Previous Diagnostics (08/2021):  - food panel: positive to peanut (1.98), wheat (0.98), walnut (1.08), soy (0.39), sesame (0.62), hazelnut (0.36), almonds (0.62). - total IgE 742 - respiratory panel: positives pigweed, ragweed, Johnson, Timothy, [place], pecan/hickory tree, elm, cedar, box elder, cockroach, dog, cat, dust mites With indeterminate elevations for sheep, mulberry, cottonwood, silver birch -10/20/2021: SPT environmentals-positive to grasses, mugwort, trees -10/20/2021: SPT to select food allergens (peanut, soy, wheat, sesame, milk, egg, casting, shellfish, fish, cashew and mushrooms) negative - Hives labs: CBCd (chronic anemia), tryptase (wnl-5), alpha-gal (negative), total IgE 830 - TSH  11/04-low <0.01, T4 wnl (1.25)  Today presents for follow-up.  She is no longer having hives, but will still get some sensation of itching.  She is using levocetirizine 1 tablet BID.  She does not like taking medications. She has been having some issues with her thyroid medications and heart palpitations since last visit.  She is having her dose decreased at the moment.  She has questions about food allergies.  We reviewed her previous testing and again explained that a positive test does not equal food allergy.  What matters most is whether she is able to eat and tolerate a food. She confirms that she is in fact eating wheat, soy, peanut, cashews and pistachios. She doesn't eat sesame and is now avoiding due to positive test.  She also avoids other tree nuts and shellfish as she has had upset stomach on multiple occasions with these foods. I advised ongoing avoidance.  We can update shellfish allergy testing if she is interested in eating again.  We did not test for this at her initial visit because she was concerned about hives and had not been eating shellfish at the time due to known history of allergy.    Allergies as of 11/22/2021       Reactions   Benicar Hct [olmesartan Medoxomil-hctz] Shortness Of Breath, Palpitations   Iodinated Diagnostic Agents Hives, Itching   02/02/2021-  pt developed 2 hives and itching even with the 13hr premedication.  Radiology PA came into eval the pt. Developed itching and hives after injection on 05/10/20; needs 13hr prep in future   Bactrim [sulfamethoxazole-trimethoprim] Other (See Comments)   Abdominal pain, dizziness   Pravastatin Other (See Comments)   Lower abdominal pain   Shellfish Allergy Nausea And Vomiting   Amlodipine Palpitations  Medication List        Accurate as of November 22, 2021  1:07 PM. If you have any questions, ask your nurse or doctor.          acetaminophen 500 MG tablet Commonly known as: TYLENOL Take 1,000 mg  by mouth every 6 (six) hours as needed for moderate pain.   apixaban 5 MG Tabs tablet Commonly known as: Eliquis Take 1 tablet (5 mg total) by mouth 2 (two) times daily.   APPLE CIDER VINEGAR PO Take 5 mLs by mouth daily.   atenolol 25 MG tablet Commonly known as: TENORMIN Take 1 tablet (25 mg total) by mouth daily.   cetirizine 10 MG tablet Commonly known as: ZyrTEC Allergy Take 1 tablet twice daily.  If hives not controlled, increase to 2 tablets twice daily.  Do this until hives free x 3 weeks, then can start decreasing 1 tablet every 3-5 days until off.   EPINEPHrine 0.3 mg/0.3 mL Soaj injection Commonly known as: EpiPen 2-Pak Inject 0.3 mg into the muscle as needed for anaphylaxis.   levocetirizine 5 MG tablet Commonly known as: XYZAL Can use up to 2 tablets in the morning and 2 tablets at night for hives/itching. This is maximum dose. Titrate as needed. What changed:  how much to take how to take this when to take this additional instructions Changed by: Sigurd Sos, MD   levothyroxine 100 MCG tablet Commonly known as: SYNTHROID Take 100 mcg by mouth daily before breakfast.   levothyroxine 50 MCG tablet Commonly known as: SYNTHROID Take 1 tablet (50 mcg total) by mouth daily.   multivitamin capsule Take 1 capsule by mouth daily.   polyethylene glycol 17 g packet Commonly known as: MIRALAX / GLYCOLAX Take 17 g by mouth daily as needed for mild constipation.   triamcinolone cream 0.1 % Commonly known as: KENALOG Apply 1 application topically 2 (two) times daily.   Vitamin D3 50 MCG (2000 UT) Tabs Take by mouth.       Past Medical History:  Diagnosis Date   Adenomatous colon polyp    Allergic rhinitis, seasonal    Allergy    Beta thalassemia trait 11/25/2013   Cholelithiasis    Class 3 obesity without serious comorbidity with body mass index (BMI) of 40.0 to 44.9 in adult 11/19/2012   endometrial ca dx'd 08/2009   endometrial    GERD  (gastroesophageal reflux disease)    HLD (hyperlipidemia)    Hypercholesterolemia    Hypertension 03/18/2017   no meds    Intraductal papilloma of left breast    Patient underwent left needle-localized lumpectomy by Dr. Imogene Burn. Tsuei on 09/09/2013; pathology showed intraductal papilloma with no atypia or malignancy identified.   PONV (postoperative nausea and vomiting)    Pre-diabetes    pt denies   Urticaria    Uterine fibroid    Past Surgical History:  Procedure Laterality Date   ANKLE FRACTURE SURGERY  1966   right   BREAST EXCISIONAL BIOPSY Left 2014   benign   BREAST LUMPECTOMY WITH NEEDLE LOCALIZATION Left 09/09/2013   Procedure: BREAST LUMPECTOMY WITH NEEDLE LOCALIZATION;  Surgeon: Imogene Burn. Georgette Dover, MD;  Location: [facility];  Service: General;  Laterality: Left;   CHOLECYSTECTOMY     COLONOSCOPY     IR IMAGING GUIDED PORT INSERTION  05/18/2020   POLYPECTOMY     ROBOTIC ASSISTED LAPAROSCOPIC CHOLECYSTECTOMY  09/09/2019   ROBOTIC ASSISTED TOTAL HYSTERECTOMY WITH BILATERAL SALPINGO OOPHERECTOMY N/A 10/22/2019   Procedure:  XI ROBOTIC ASSISTED TOTAL HYSTERECTOMY WITH BILATERAL SALPINGO OOPHORECTOMY GREATER THAN 250 GRAMS, MINI LAPARTOMY FOR SPECIMEN DELIVERY; PELVIC AND PERI-AORTIC LYMPHADENECTOMY;  Surgeon: Everitt Amber, MD;  Location: [facility];  Service: Gynecology;  Laterality: N/A;   SENTINEL NODE BIOPSY N/A 10/22/2019   Procedure: SENTINEL NODE BIOPSY;  Surgeon: Everitt Amber, MD;  Location: [facility];  Service: Gynecology;  Laterality: N/A;   Otherwise, there have been no changes to her past medical history, surgical history, family history, or social history.  ROS: All others negative except as noted per HPI.   Objective:  BP 134/76   Pulse 68   Temp 97.9 F (36.6 C) (Temporal)   Resp 16   SpO2 98%  There is no height or weight on file to calculate BMI. Physical Exam: General Appearance:  Alert, cooperative, no distress, appears stated age  Head:  Normocephalic, without  obvious abnormality, atraumatic  Eyes:  Conjunctiva clear, EOM's intact  Nose: Nares normal  Throat: Lips, tongue normal; teeth and gums normal  Neck: Supple, symmetrical  Lungs:   Respirations unlabored, no coughing  Heart:  Appears well perfused  Extremities: No edema  Skin: Skin color, texture, turgor normal, no rashes or lesions on visualized portions of skin  Neurologic: No gross deficits   Assessment/Plan   Patient Instructions  Chronic idiopathic Urticaria (hives lasting more than 6 weeks, unknown trigger): - skin testing at last visit showed positive to grasses, weed (mugwor), trees (elm, oak, walnut) - previous hives testing was reassuring - she has known hypothyroidism  Therapy Plan:  - continue Xyzal (levocetirizine) 5 mg twice daily - if hives remain uncontrolled, increase dose of Xyzal (levocetirizine) 5 mg to max dose of 10mg  (2 pills) twice daily- this is maximum dose - can increase or decrease dosing depending on symptom control to a maximum dose of 4 tablets of antihistamine daily. Wait until hives free for at least one month prior to decreasing dose.   - if hives are still uncontrolled with the above regimen, please arrange an appointment for discussion of Xolair (omalizumab)- an injectable medication for hives  Can use one of the following in place of Xyzal if desires: Zyrtec (cetirizine) 10 mg, Claritin (loratadine) 10 mg, Xyzal (levocetirizine) 5 mg or Allegra (fexofenadine) 180 mg daily as needed  Food allergy:  - previous serum testing positive to peanut, wheat, walnut, soy, sesame, hazelnut and almonds - previous skin testing was negative to mushroom - we can update testing for shellfish and tree nuts in the future if interested, please stay off antihistamines for 3 days prior to any skin testing appointments - since you are eating and tolerating wheat, soy, peanut, cashew and pistachio okay to continue doing so (these are considered false positives) - please  strictly avoid shellfish, tree nuts (except cashews and pistachios which you already eat and tolerate), sesame - for SKIN only reaction, take zyrtec as described above - for SKIN + ANY additional symptoms, OR IF concern for LIFE THREATENING reaction = Epipen Autoinjector EpiPen 0.3 mg. - If using Epinephrine autoinjector, call 911 - A food allergy action plan has been provided and discussed. - Medic Alert identification is recommended.  Follow-up in 3 months, sooner if needed.   Sigurd Sos, MD  [facility]

## 2021-11-22 ENCOUNTER — Ambulatory Visit (INDEPENDENT_AMBULATORY_CARE_PROVIDER_SITE_OTHER): Payer: Medicare Other | Admitting: Internal Medicine

## 2021-11-22 ENCOUNTER — Other Ambulatory Visit: Payer: Self-pay

## 2021-11-22 ENCOUNTER — Encounter: Payer: Self-pay | Admitting: Internal Medicine

## 2021-11-22 VITALS — BP 134/76 | HR 68 | Temp 97.9°F | Resp 16

## 2021-11-22 DIAGNOSIS — H1013 Acute atopic conjunctivitis, bilateral: Secondary | ICD-10-CM

## 2021-11-22 DIAGNOSIS — Z91018 Allergy to other foods: Secondary | ICD-10-CM

## 2021-11-22 DIAGNOSIS — J309 Allergic rhinitis, unspecified: Secondary | ICD-10-CM

## 2021-11-22 DIAGNOSIS — H1010 Acute atopic conjunctivitis, unspecified eye: Secondary | ICD-10-CM

## 2021-11-22 DIAGNOSIS — L501 Idiopathic urticaria: Secondary | ICD-10-CM

## 2021-11-22 MED ORDER — LEVOCETIRIZINE DIHYDROCHLORIDE 5 MG PO TABS: ORAL_TABLET | ORAL | 3 refills | Status: DC

## 2021-11-22 NOTE — Patient Instructions (Addendum)
Chronic idiopathic Urticaria (hives lasting more than 6 weeks, unknown trigger): - skin testing at last visit showed positive to grasses, weed (mugwor), trees (elm, oak, walnut) - previous hives testing was reassuring - she has known hypothyroidism  Therapy Plan:  - continue Xyzal (levocetirizine) 5 mg twice daily - if hives remain uncontrolled, increase dose of Xyzal (levocetirizine) 5 mg to max dose of 10mg  (2 pills) twice daily- this is maximum dose - can increase or decrease dosing depending on symptom control to a maximum dose of 4 tablets of antihistamine daily. Wait until hives free for at least one month prior to decreasing dose.   - if hives are still uncontrolled with the above regimen, please arrange an appointment for discussion of Xolair (omalizumab)- an injectable medication for hives  Can use one of the following in place of Xyzal if desires: Zyrtec (cetirizine) 10 mg, Claritin (loratadine) 10 mg, Xyzal (levocetirizine) 5 mg or Allegra (fexofenadine) 180 mg daily as needed  Food allergy:  - previous serum testing positive to peanut, wheat, walnut, soy, sesame, hazelnut and almonds - previous skin testing was negative to mushroom - we can update testing for shellfish and tree nuts in the future if interested, please stay off antihistamines for 3 days prior to any skin testing appointments - since you are eating and tolerating wheat, soy, peanut, cashew and pistachio okay to continue doing so (these are considered false positives) - please strictly avoid shellfish, tree nuts (except cashews and pistachios which you already eat and tolerate), sesame - for SKIN only reaction, take zyrtec as described above - for SKIN + ANY additional symptoms, OR IF concern for LIFE THREATENING reaction = Epipen Autoinjector EpiPen 0.3 mg. - If using Epinephrine autoinjector, call 911 - A food allergy action plan has been provided and discussed. - Medic Alert identification is  recommended.   Follow-up in 3 months, sooner if needed.  -------------------------------------------------------------------- Reducing Pollen Exposure  The American Academy of Allergy, Asthma and Immunology suggests the following steps to reduce your exposure to pollen during allergy seasons.    Do not hang sheets or clothing out to dry; pollen may collect on these items. Do not mow lawns or spend time around freshly cut grass; mowing stirs up pollen. Keep windows closed at night.  Keep car windows closed while driving. Minimize morning activities outdoors, a time when pollen counts are usually at their highest. Stay indoors as much as possible when pollen counts or humidity is high and on windy days when pollen tends to remain in the air longer. Use air conditioning when possible.  Many air conditioners have filters that trap the pollen spores. Use a HEPA room air filter to remove pollen form the indoor air you breathe.  DUST MITE AVOIDANCE MEASURES:  There are three main measures that need and can be taken to avoid house dust mites:  Reduce accumulation of dust in general -reduce furniture, clothing, carpeting, books, stuffed animals, especially in bedroom  Separate yourself from the dust -use pillow and mattress encasements (can be found at stores such as [employer] or online) -avoid direct exposure to air condition flow -use a HEPA filter device, especially in the bedroom; you can also use a HEPA filter vacuum cleaner -wipe dust with a moist towel instead of a dry towel or broom when cleaning  Decrease mites and/or their secretions -wash clothing and linen and stuffed animals at highest temperature possible, at least every 2 weeks -stuffed animals can also be placed  in a bag and put in a freezer overnight  Despite the above measures, it is impossible to eliminate dust mites or their allergen completely from your home.  With the above measures the burden of mites in  your home can be diminished, with the goal of minimizing your allergic symptoms.  Success will be reached only when implementing and using all means together.

## 2021-12-13 ENCOUNTER — Telehealth: Payer: Self-pay | Admitting: Internal Medicine

## 2021-12-13 NOTE — Telephone Encounter (Signed)
Patient states she has flu like symptoms  Patient requesting rx for symptoms  Offered patient a vv for tomorrow 12-12, patient declined  Patient requesting a call back

## 2021-12-14 NOTE — Telephone Encounter (Signed)
Called pt, LVM stating that an OV is needed for symptoms.

## 2021-12-15 ENCOUNTER — Ambulatory Visit (INDEPENDENT_AMBULATORY_CARE_PROVIDER_SITE_OTHER): Payer: Medicare Other | Admitting: Internal Medicine

## 2021-12-15 ENCOUNTER — Encounter: Payer: Self-pay | Admitting: Internal Medicine

## 2021-12-15 ENCOUNTER — Other Ambulatory Visit: Payer: Self-pay

## 2021-12-15 VITALS — BP 130/78 | HR 60 | Temp 98.3°F | Resp 18 | Ht 64.0 in | Wt 196.2 lb

## 2021-12-15 DIAGNOSIS — J069 Acute upper respiratory infection, unspecified: Secondary | ICD-10-CM

## 2021-12-15 LAB — POC COVID19 BINAXNOW: SARS Coronavirus 2 Ag: NEGATIVE

## 2021-12-15 MED ORDER — OSELTAMIVIR PHOSPHATE 75 MG PO CAPS: 75.0000 mg | ORAL_CAPSULE | Freq: Two times a day (BID) | ORAL | 0 refills | Status: DC

## 2021-12-15 MED ORDER — FLUTICASONE PROPIONATE 50 MCG/ACT NA SUSP: 2.0000 | Freq: Every day | NASAL | 6 refills | Status: DC

## 2021-12-15 NOTE — Patient Instructions (Addendum)
I would add the flonase to use 2 sprays in each nostril once a day.   We have sent in tamiflu to take 1 pill twice a day for 5 days.

## 2021-12-15 NOTE — Progress Notes (Signed)
° °  Subjective:   Patient ID: Tina Stone, female    DOB: May 09, 1950, 71 y.o.   MRN: [id number]  HPI The patient is a 71 YO female coming in for 2 days cold symptoms. Flu and covid-19 vaccines up to date.   Review of Systems  Constitutional:  Positive for activity change. Negative for appetite change, chills, fatigue, fever and unexpected weight change.  HENT:  Positive for congestion, postnasal drip, rhinorrhea and sinus pressure. Negative for ear discharge, ear pain, sinus pain, sneezing, sore throat, tinnitus, trouble swallowing and voice change.   Eyes: Negative.   Respiratory:  Positive for cough. Negative for chest tightness, shortness of breath and wheezing.   Cardiovascular: Negative.   Gastrointestinal: Negative.   Musculoskeletal:  Negative for myalgias.  Neurological: Negative.    Objective:  Physical Exam Constitutional:      Appearance: She is well-developed.  HENT:     Head: Normocephalic and atraumatic.     Comments: Oropharynx with redness and clear drainage, nose with swollen turbinates, TMs normal bilaterally.  Neck:     Thyroid: No thyromegaly.  Cardiovascular:     Rate and Rhythm: Normal rate and regular rhythm.  Pulmonary:     Effort: Pulmonary effort is normal. No respiratory distress.     Breath sounds: Normal breath sounds. No wheezing or rales.  Abdominal:     Palpations: Abdomen is soft.  Musculoskeletal:        General: No tenderness.     Cervical back: Normal range of motion.  Lymphadenopathy:     Cervical: No cervical adenopathy.  Skin:    General: Skin is warm and dry.  Neurological:     Mental Status: She is alert and oriented to person, place, and time.    Vitals:   12/15/21 0913  BP: 130/78  Pulse: 60  Resp: 18  Temp: 98.3 F (36.8 C)  TempSrc: Oral  SpO2: 100%  Weight: 196 lb 3.2 oz (89 kg)  Height: 5\' 4"  (1.626 m)    This visit occurred during the SARS-CoV-2 public health emergency.  Safety protocols were in place,  including screening questions prior to the visit, additional usage of staff PPE, and extensive cleaning of exam room while observing appropriate contact time as indicated for disinfecting solutions.   Assessment & Plan:

## 2021-12-15 NOTE — Assessment & Plan Note (Signed)
POC covid-19 testing done and negative. Exposure to others at church who told her they had flu. Given within window will treat with tamiflu. Rx flonase as well to help with drainage. Continue xyzal and supportive care.

## 2021-12-18 ENCOUNTER — Inpatient Hospital Stay: Payer: Medicare Other | Attending: Gynecologic Oncology

## 2021-12-18 ENCOUNTER — Other Ambulatory Visit: Payer: Self-pay

## 2021-12-18 ENCOUNTER — Telehealth: Payer: Self-pay | Admitting: Internal Medicine

## 2021-12-18 DIAGNOSIS — Z452 Encounter for adjustment and management of vascular access device: Secondary | ICD-10-CM | POA: Insufficient documentation

## 2021-12-18 DIAGNOSIS — C541 Malignant neoplasm of endometrium: Secondary | ICD-10-CM | POA: Insufficient documentation

## 2021-12-18 MED ORDER — HEPARIN SOD (PORK) LOCK FLUSH 100 UNIT/ML IV SOLN
500.0000 [IU] | Freq: Once | INTRAVENOUS | Status: AC
Start: 2021-12-18 — End: 2021-12-18
  Administered 2021-12-18: 08:00:00 500 [IU]

## 2021-12-18 MED ORDER — SODIUM CHLORIDE 0.9% FLUSH
10.0000 mL | Freq: Once | INTRAVENOUS | Status: AC
  Administered 2021-12-18: 08:00:00 10 mL

## 2021-12-18 NOTE — Telephone Encounter (Signed)
See below

## 2021-12-18 NOTE — Telephone Encounter (Signed)
Patient states she was seen in office by Dr. Sharlet Salina on 12-16  Patient states she still has a persistent cough  Patient requesting rx for cough  Pharmacy [id number] - [place], [place]

## 2021-12-19 ENCOUNTER — Encounter: Payer: Self-pay | Admitting: Hematology and Oncology

## 2021-12-19 ENCOUNTER — Other Ambulatory Visit (HOSPITAL_COMMUNITY): Payer: Self-pay

## 2021-12-19 MED ORDER — BENZONATATE 200 MG PO CAPS
200.0000 mg | ORAL_CAPSULE | Freq: Three times a day (TID) | ORAL | 0 refills | Status: DC | PRN
Start: 2021-12-19 — End: 2022-02-05
  Filled 2021-12-19: qty 60, 20d supply, fill #0

## 2021-12-19 NOTE — Telephone Encounter (Signed)
Sent in [place] and can take up to 3 times a day. It is very common for a cough to last for 3-4 weeks so likely this is still normal.

## 2021-12-20 NOTE — Telephone Encounter (Signed)
Called pt. LDVM at [phone] letting her know that her medication for cough has been sent to her pharmacy. Office number was provided .

## 2022-01-01 ENCOUNTER — Telehealth: Payer: Self-pay

## 2022-01-01 NOTE — Telephone Encounter (Signed)
Insurance will only cover Xyzal for once a day. Please advise

## 2022-01-01 NOTE — Telephone Encounter (Signed)
Patient is calling back wanting an update on Xyzal.

## 2022-01-01 NOTE — Telephone Encounter (Signed)
Patient called stating the pharmacy informed her that the insurance would not cover the XYZAL prescription. Patient states they will do it for once a day.   Please advise

## 2022-01-02 NOTE — Telephone Encounter (Signed)
Patient called to see if we were able to fix her Xyzal meds to take 2 times a day. [place] told her to call us. [phone].

## 2022-01-02 NOTE — Telephone Encounter (Signed)
Please call patient.  The Xyzal is actually over the counter. She can buy the generic version called levocetirizine 5mg  tablet.  If she has access to [facility] or [facility] - the medication is usually cheaper there than the pharmacy or other stores.

## 2022-01-02 NOTE — Telephone Encounter (Signed)
I called the patient and left a detailed message that her taking Xyzal twice a day is not covered by her insurance. Also that Dr.Cherokee Boccio is out of town. Please advise on allergy medication recommendations.

## 2022-01-08 DIAGNOSIS — H40013 Open angle with borderline findings, low risk, bilateral: Secondary | ICD-10-CM | POA: Diagnosis not present

## 2022-01-26 ENCOUNTER — Encounter: Payer: Self-pay | Admitting: Hematology and Oncology

## 2022-02-01 ENCOUNTER — Telehealth: Payer: Self-pay

## 2022-02-01 NOTE — Telephone Encounter (Signed)
Called pt, LVM stating that she would need an OV to discuss her concerns with PCP with the potential of needing lab work.

## 2022-02-01 NOTE — Telephone Encounter (Signed)
Pt calling in stating that she is experience fast heart rate, feeling hot, Hair Loss, and  insomnia.  Pt is taking levothyroxine (SYNTHROID) 100 MCG tablet and levothyroxine (SYNTHROID) 50 MCG tablet daily.  Pt states she stop medication for 2 days(1/31-2/1) and the symptoms seemed to subside. Pt started back taking medication today 02/01/22.  Pt is wondering if the medication needs to be adjusted.

## 2022-02-02 ENCOUNTER — Encounter: Payer: Self-pay | Admitting: Internal Medicine

## 2022-02-02 ENCOUNTER — Other Ambulatory Visit: Payer: Self-pay | Admitting: Internal Medicine

## 2022-02-02 ENCOUNTER — Ambulatory Visit (HOSPITAL_COMMUNITY)
Admission: RE | Admit: 2022-02-02 | Discharge: 2022-02-02 | Disposition: A | Discharge status: Home / Self Care | Payer: Medicare Other | Source: Ambulatory Visit | Attending: Hematology and Oncology | Admitting: Hematology and Oncology

## 2022-02-02 ENCOUNTER — Inpatient Hospital Stay: Payer: Medicare Other | Attending: Gynecologic Oncology

## 2022-02-02 ENCOUNTER — Ambulatory Visit (INDEPENDENT_AMBULATORY_CARE_PROVIDER_SITE_OTHER): Payer: Medicare Other | Admitting: Internal Medicine

## 2022-02-02 ENCOUNTER — Other Ambulatory Visit: Payer: Self-pay

## 2022-02-02 VITALS — BP 122/64 | HR 68 | Temp 98.6°F | Ht 64.0 in | Wt 200.0 lb

## 2022-02-02 DIAGNOSIS — Z9221 Personal history of antineoplastic chemotherapy: Secondary | ICD-10-CM | POA: Insufficient documentation

## 2022-02-02 DIAGNOSIS — I10 Essential (primary) hypertension: Secondary | ICD-10-CM | POA: Diagnosis not present

## 2022-02-02 DIAGNOSIS — C541 Malignant neoplasm of endometrium: Secondary | ICD-10-CM | POA: Diagnosis present

## 2022-02-02 DIAGNOSIS — Z9079 Acquired absence of other genital organ(s): Secondary | ICD-10-CM | POA: Insufficient documentation

## 2022-02-02 DIAGNOSIS — Z7189 Other specified counseling: Secondary | ICD-10-CM

## 2022-02-02 DIAGNOSIS — C801 Malignant (primary) neoplasm, unspecified: Secondary | ICD-10-CM | POA: Diagnosis not present

## 2022-02-02 DIAGNOSIS — E538 Deficiency of other specified B group vitamins: Secondary | ICD-10-CM | POA: Diagnosis not present

## 2022-02-02 DIAGNOSIS — E0590 Thyrotoxicosis, unspecified without thyrotoxic crisis or storm: Secondary | ICD-10-CM | POA: Diagnosis not present

## 2022-02-02 DIAGNOSIS — E785 Hyperlipidemia, unspecified: Secondary | ICD-10-CM

## 2022-02-02 DIAGNOSIS — Z90710 Acquired absence of both cervix and uterus: Secondary | ICD-10-CM | POA: Insufficient documentation

## 2022-02-02 DIAGNOSIS — C787 Secondary malignant neoplasm of liver and intrahepatic bile duct: Secondary | ICD-10-CM | POA: Diagnosis not present

## 2022-02-02 DIAGNOSIS — E559 Vitamin D deficiency, unspecified: Secondary | ICD-10-CM | POA: Diagnosis not present

## 2022-02-02 DIAGNOSIS — K7689 Other specified diseases of liver: Secondary | ICD-10-CM | POA: Diagnosis not present

## 2022-02-02 DIAGNOSIS — R590 Localized enlarged lymph nodes: Secondary | ICD-10-CM | POA: Insufficient documentation

## 2022-02-02 DIAGNOSIS — R7303 Prediabetes: Secondary | ICD-10-CM | POA: Diagnosis not present

## 2022-02-02 DIAGNOSIS — K5730 Diverticulosis of large intestine without perforation or abscess without bleeding: Secondary | ICD-10-CM | POA: Diagnosis not present

## 2022-02-02 DIAGNOSIS — C7951 Secondary malignant neoplasm of bone: Secondary | ICD-10-CM | POA: Diagnosis not present

## 2022-02-02 DIAGNOSIS — Z90722 Acquired absence of ovaries, bilateral: Secondary | ICD-10-CM | POA: Insufficient documentation

## 2022-02-02 DIAGNOSIS — N2889 Other specified disorders of kidney and ureter: Secondary | ICD-10-CM | POA: Diagnosis not present

## 2022-02-02 LAB — CMP (CANCER CENTER ONLY)
ALT: 10 U/L (ref 0–44)
AST: 16 U/L (ref 15–41)
Albumin: 4 g/dL (ref 3.5–5.0)
Alkaline Phosphatase: 99 U/L (ref 38–126)
Anion gap: 8 (ref 5–15)
BUN: 13 mg/dL (ref 8–23)
CO2: 27 mmol/L (ref 22–32)
Calcium: 9.4 mg/dL (ref 8.9–10.3)
Chloride: 103 mmol/L (ref 98–111)
Creatinine: 0.82 mg/dL (ref 0.44–1.00)
GFR, Estimated: >60 mL/min (ref >60)
Glucose, Bld: 99 mg/dL (ref 70–99)
Potassium: 4.1 mmol/L (ref 3.5–5.1)
Sodium: 138 mmol/L (ref 135–145)
Total Bilirubin: 0.4 mg/dL (ref 0.3–1.2)
Total Protein: 7.2 g/dL (ref 6.5–8.1)

## 2022-02-02 LAB — CBC WITH DIFFERENTIAL (CANCER CENTER ONLY)
Abs Immature Granulocytes: 0 10*3/uL (ref 0.00–0.07)
Basophils Absolute: 0 10*3/uL (ref 0.0–0.1)
Basophils Relative: 1 %
Eosinophils Absolute: 0.1 10*3/uL (ref 0.0–0.5)
Eosinophils Relative: 3 %
HCT: 37 % (ref 36.0–46.0)
Hemoglobin: 11.7 g/dL — ABNORMAL LOW (ref 12.0–15.0)
Immature Granulocytes: 0 %
Lymphocytes Relative: 38 %
Lymphs Abs: 1.4 10*3/uL (ref 0.7–4.0)
MCH: 22.7 pg — ABNORMAL LOW (ref 26.0–34.0)
MCHC: 31.6 g/dL (ref 30.0–36.0)
MCV: 71.8 fL — ABNORMAL LOW (ref 80.0–100.0)
Monocytes Absolute: 0.2 10*3/uL (ref 0.1–1.0)
Monocytes Relative: 6 %
Neutro Abs: 1.9 10*3/uL (ref 1.7–7.7)
Neutrophils Relative %: 52 %
Platelet Count: 202 10*3/uL (ref 150–400)
RBC: 5.15 MIL/uL — ABNORMAL HIGH (ref 3.87–5.11)
RDW: 18.7 % — ABNORMAL HIGH (ref 11.5–15.5)
WBC Count: 3.7 10*3/uL — ABNORMAL LOW (ref 4.0–10.5)
nRBC: 0 % (ref 0.0–0.2)

## 2022-02-02 LAB — URINALYSIS, ROUTINE W REFLEX MICROSCOPIC
Bilirubin Urine: NEGATIVE
Ketones, ur: NEGATIVE
Leukocytes,Ua: NEGATIVE
Nitrite: NEGATIVE
Specific Gravity, Urine: <=1.005 — AB (ref 1.000–1.030)
Total Protein, Urine: NEGATIVE
Urine Glucose: NEGATIVE
Urobilinogen, UA: 0.2 (ref 0.0–1.0)
pH: 6 (ref 5.0–8.0)

## 2022-02-02 LAB — HEMOGLOBIN A1C: Hgb A1c MFr Bld: 5.3 % (ref 4.6–6.5)

## 2022-02-02 LAB — VITAMIN B12: Vitamin B-12: >1504 pg/mL — ABNORMAL HIGH (ref 211–911)

## 2022-02-02 LAB — LIPID PANEL
Cholesterol: 234 mg/dL — ABNORMAL HIGH (ref 0–200)
HDL: 72.6 mg/dL (ref >39.00)
LDL Cholesterol: 145 mg/dL — ABNORMAL HIGH (ref 0–99)
NonHDL: 161.65
Total CHOL/HDL Ratio: 3
Triglycerides: 84 mg/dL (ref 0.0–149.0)
VLDL: 16.8 mg/dL (ref 0.0–40.0)

## 2022-02-02 LAB — VITAMIN D 25 HYDROXY (VIT D DEFICIENCY, FRACTURES): VITD: 39.87 ng/mL (ref 30.00–100.00)

## 2022-02-02 LAB — TSH: TSH: 0.05 u[IU]/mL — ABNORMAL LOW (ref 0.35–5.50)

## 2022-02-02 LAB — T4, FREE: Free T4: 0.74 ng/dL (ref 0.60–1.60)

## 2022-02-02 MED ORDER — HEPARIN SOD (PORK) LOCK FLUSH 100 UNIT/ML IV SOLN: 500.0000 [IU] | Freq: Once | INTRAVENOUS | Status: DC

## 2022-02-02 MED ORDER — HEPARIN SOD (PORK) LOCK FLUSH 100 UNIT/ML IV SOLN
500.0000 [IU] | Freq: Once | INTRAVENOUS | Status: AC
  Administered 2022-02-02: 500 [IU] via INTRAVENOUS

## 2022-02-02 MED ORDER — SODIUM CHLORIDE 0.9% FLUSH
10.0000 mL | Freq: Once | INTRAVENOUS | Status: AC
  Administered 2022-02-02: 10 mL

## 2022-02-02 MED ORDER — SODIUM CHLORIDE (PF) 0.9 % IJ SOLN
INTRAMUSCULAR | Status: AC
  Filled 2022-02-02: qty 50

## 2022-02-02 MED ORDER — HEPARIN SOD (PORK) LOCK FLUSH 100 UNIT/ML IV SOLN
INTRAVENOUS | Status: AC
  Filled 2022-02-02: qty 5

## 2022-02-02 NOTE — Progress Notes (Signed)
Patient ID: Tina Stone, female   DOB: [birth year], 73 y.o.   MRN: [id number]        Chief Complaint: follow up iatrogenic hyperthyroidism , htn, hld preDM       HPI:  Tina Stone is a 72 y.o. female here with c/o better but still persistent intemrittent palps as before, nervous jittery and brain fog without recent covid infection.  Pt denies chest pain, increased sob or doe, wheezing, orthopnea, PND, increased LE swelling, dizziness or syncope.   Pt denies polydipsia, polyuria, or new focal neuro s/s.   Pt denies fever, wt loss, night sweats, loss of appetite, or other constitutional symptoms, in fact has gained wt with excess calories. Has been trying to get appt with PCP but not able recently        Wt Readings from Last 3 Encounters:  02/02/22 200 lb (90.7 kg)  12/15/21 196 lb 3.2 oz (89 kg)  11/14/21 193 lb (87.5 kg)   BP Readings from Last 3 Encounters:  02/02/22 122/64  12/15/21 130/78  11/22/21 134/76         Past Medical History:  Diagnosis Date   Adenomatous colon polyp    Allergic rhinitis, seasonal    Allergy    Beta thalassemia trait 11/25/2013   Cholelithiasis    Class 3 obesity without serious comorbidity with body mass index (BMI) of 40.0 to 44.9 in adult 11/19/2012   endometrial ca dx'd 08/2009   endometrial    GERD (gastroesophageal reflux disease)    HLD (hyperlipidemia)    Hypercholesterolemia    Hypertension 03/18/2017   no meds    Intraductal papilloma of left breast    Patient underwent left needle-localized lumpectomy by Dr. Imogene Burn. Tsuei on 09/09/2013; pathology showed intraductal papilloma with no atypia or malignancy identified.   PONV (postoperative nausea and vomiting)    Pre-diabetes    pt denies   Urticaria    Uterine fibroid    Past Surgical History:  Procedure Laterality Date   ANKLE FRACTURE SURGERY  1966   right   BREAST EXCISIONAL BIOPSY Left 2014   benign   BREAST LUMPECTOMY WITH NEEDLE LOCALIZATION Left 09/09/2013    Procedure: BREAST LUMPECTOMY WITH NEEDLE LOCALIZATION;  Surgeon: Imogene Burn. Georgette Dover, MD;  Location: [facility];  Service: General;  Laterality: Left;   CHOLECYSTECTOMY     COLONOSCOPY     IR IMAGING GUIDED PORT INSERTION  05/18/2020   POLYPECTOMY     ROBOTIC ASSISTED LAPAROSCOPIC CHOLECYSTECTOMY  09/09/2019   ROBOTIC ASSISTED TOTAL HYSTERECTOMY WITH BILATERAL SALPINGO OOPHERECTOMY N/A 10/22/2019   Procedure: XI ROBOTIC ASSISTED TOTAL HYSTERECTOMY WITH BILATERAL SALPINGO OOPHORECTOMY GREATER THAN 250 GRAMS, MINI LAPARTOMY FOR SPECIMEN DELIVERY; PELVIC AND PERI-AORTIC LYMPHADENECTOMY;  Surgeon: Everitt Amber, MD;  Location: [facility];  Service: Gynecology;  Laterality: N/A;   SENTINEL NODE BIOPSY N/A 10/22/2019   Procedure: SENTINEL NODE BIOPSY;  Surgeon: Everitt Amber, MD;  Location: [facility];  Service: Gynecology;  Laterality: N/A;    reports that she has never smoked. She has never used smokeless tobacco. She reports that she does not drink alcohol and does not use drugs. family history includes Asthma in her brother and paternal grandmother; Colon polyps (age of onset: 44) in her mother; Congestive Heart Failure in her father; Dementia (age of onset: 41) in her mother; Diabetes in her father and mother; Hypertension in her mother; Pancreatic cancer in her paternal aunt. Allergies  Allergen Reactions   Benicar Hct [Olmesartan Medoxomil-Hctz] Shortness Of  Breath and Palpitations   Iodinated Contrast Media Hives and Itching    02/02/2021-  pt developed 2 hives and itching even with the 13hr premedication.  Radiology PA came into eval the pt.  Developed itching and hives after injection on 05/10/20; needs 13hr prep in future   Bactrim [Sulfamethoxazole-Trimethoprim] Other (See Comments)    Abdominal pain, dizziness   Pravastatin Other (See Comments)    Lower abdominal pain   Shellfish Allergy Nausea And Vomiting   Amlodipine Palpitations   Current Outpatient Medications on File Prior to Visit  Medication Sig  Dispense Refill   acetaminophen (TYLENOL) 500 MG tablet Take 1,000 mg by mouth every 6 (six) hours as needed for moderate pain.     apixaban (ELIQUIS) 5 MG TABS tablet Take 1 tablet (5 mg total) by mouth 2 (two) times daily. 180 tablet 1   APPLE CIDER VINEGAR PO Take 5 mLs by mouth daily.      atenolol (TENORMIN) 25 MG tablet Take 1 tablet (25 mg total) by mouth daily. 90 tablet 1   cetirizine (ZYRTEC ALLERGY) 10 MG tablet Take 1 tablet twice daily.  If hives not controlled, increase to 2 tablets twice daily.  Do this until hives free x 3 weeks, then can start decreasing 1 tablet every 3-5 days until off. 120 tablet 4   Cholecalciferol (VITAMIN D3) 50 MCG (2000 UT) TABS Take by mouth.     EPINEPHrine (EPIPEN 2-PAK) 0.3 mg/0.3 mL IJ SOAJ injection Inject 0.3 mg into the muscle as needed for anaphylaxis. 1 each 2   fluticasone (FLONASE) 50 MCG/ACT nasal spray Place 2 sprays into both nostrils daily. 16 g 6   levocetirizine (XYZAL) 5 MG tablet Can use up to 2 tablets in the morning and 2 tablets at night for hives/itching. This is maximum dose. Titrate as needed. 120 tablet 3   levothyroxine (SYNTHROID) 100 MCG tablet Take 100 mcg by mouth daily before breakfast.     Multiple Vitamin (MULTIVITAMIN) capsule Take 1 capsule by mouth daily.     polyethylene glycol (MIRALAX / GLYCOLAX) 17 g packet Take 17 g by mouth daily as needed for mild constipation.     triamcinolone cream (KENALOG) 0.1 % Apply 1 application topically 2 (two) times daily. 30 g 1   benzonatate (TESSALON) 200 MG capsule Take 1 capsule (200 mg total) by mouth 3 (three) times daily as needed. (Patient not taking: Reported on 02/02/2022) 60 capsule 0   [DISCONTINUED] atorvastatin (LIPITOR) 20 MG tablet Take 1 tablet (20 mg total) by mouth daily. 90 tablet 1   No current facility-administered medications on file prior to visit.        ROS:  All others reviewed and negative.  Objective        PE:  BP 122/64 (BP Location: Right Arm,  Patient Position: Sitting, Cuff Size: Large)    Pulse 68    Temp 98.6 F (37 C) (Oral)    Ht 5\' 4"  (1.626 m)    Wt 200 lb (90.7 kg)    BMI 34.33 kg/m                 Constitutional: Pt appears in NAD               HENT: Head: NCAT.                Right Ear: External ear normal.                 Left  Ear: External ear normal.                Eyes: . Pupils are equal, round, and reactive to light. Conjunctivae and EOM are normal               Nose: without d/c or deformity               Neck: Neck supple. Gross normal ROM               Cardiovascular: Normal rate and regular rhythm.                 Pulmonary/Chest: Effort normal and breath sounds without rales or wheezing.                Abd:  Soft, NT, ND, + BS, no organomegaly               Neurological: Pt is alert. At baseline orientation, motor grossly intact               Skin: Skin is warm. No rashes, no other new lesions, LE edema - none               Psychiatric: Pt behavior is normal without agitation   Micro: none  Cardiac tracings I have personally interpreted today:  none  Pertinent Radiological findings (summarize): none   Lab Results  Component Value Date   WBC 3.7 (L) 02/02/2022   HGB 11.7 (L) 02/02/2022   HCT 37.0 02/02/2022   PLT 202 02/02/2022   GLUCOSE 99 02/02/2022   CHOL 234 (H) 02/02/2022   TRIG 84.0 02/02/2022   HDL 72.60 02/02/2022   LDLCALC 145 (H) 02/02/2022   ALT 10 02/02/2022   AST 16 02/02/2022   NA 138 02/02/2022   K 4.1 02/02/2022   CL 103 02/02/2022   CREATININE 0.82 02/02/2022   BUN 13 02/02/2022   CO2 27 02/02/2022   TSH 0.05 (L) 02/02/2022   INR 1.0 01/28/2021   HGBA1C 5.3 02/02/2022   Assessment/Plan:  Tina Stone is a 72 y.o. Black or African American [2] female with  has a past medical history of Adenomatous colon polyp, Allergic rhinitis, seasonal, Allergy, Beta thalassemia trait (11/25/2013), Cholelithiasis, Class 3 obesity without serious comorbidity with body mass index  (BMI) of 40.0 to 44.9 in adult (11/19/2012), endometrial ca (dx'd 08/2009), GERD (gastroesophageal reflux disease), HLD (hyperlipidemia), Hypercholesterolemia, Hypertension (03/18/2017), Intraductal papilloma of left breast, PONV (postoperative nausea and vomiting), Pre-diabetes, Urticaria, and Uterine fibroid.  Essential hypertension BP Readings from Last 3 Encounters:  02/02/22 122/64  12/15/21 130/78  11/22/21 134/76   Stable, pt to continue medical treatment tenormin 25   Hyperlipidemia with target LDL less than 100 Lab Results  Component Value Date   LDLCALC 145 (H) 02/02/2022   Uncontrolled, pt reqeusted lipids and labs today to assess diet efforts, does not want statin for now   Prediabetes Lab Results  Component Value Date   HGBA1C 5.3 02/02/2022   Stable, pt to continue current medical treatment  - diet, wt control   Vitamin D deficiency Last vitamin D Lab Results  Component Value Date   VD25OH 39.87 02/02/2022   otherwise normal, to start oral replacement   Hyperthyroidism Iatrogenic, still mild to mod symptomatci, for f/u TFTs today and suspect will need reduced levothryoxine and f/u TFTs  Followup: Return if symptoms worsen or fail to improve.  Cathlean Cower, MD 02/03/2022 4:45 PM [place]  [facility] - [facility]

## 2022-02-02 NOTE — Patient Instructions (Signed)
Please continue all other medications as before  Please have the pharmacy call with any other refills you may need.  Please continue your efforts at being more active, low cholesterol diet, and weight control  Please keep your appointments with your specialists as you may have planned  Please go to the LAB at the blood drawing area for the tests to be done - the "physical" and thyroid testing that was not done earlier today at the cancer center  You will be contacted by phone if any changes need to be made immediately.  Otherwise, you will receive a letter about your results with an explanation, but please check with MyChart first.  Please remember to sign up for MyChart if you have not done so, as this will be important to you in the future with finding out test results, communicating by private email, and scheduling acute appointments online when needed.  Please see Dr Ronnald Ramp Feb 20 as you have planned

## 2022-02-03 DIAGNOSIS — E0590 Thyrotoxicosis, unspecified without thyrotoxic crisis or storm: Secondary | ICD-10-CM | POA: Insufficient documentation

## 2022-02-03 NOTE — Assessment & Plan Note (Signed)
Last vitamin D Lab Results  Component Value Date   VD25OH 39.87 02/02/2022   otherwise normal, to start oral replacement

## 2022-02-03 NOTE — Assessment & Plan Note (Signed)
BP Readings from Last 3 Encounters:  02/02/22 122/64  12/15/21 130/78  11/22/21 134/76   Stable, pt to continue medical treatment tenormin 25

## 2022-02-03 NOTE — Assessment & Plan Note (Signed)
Iatrogenic, still mild to mod symptomatci, for f/u TFTs today and suspect will need reduced levothryoxine and f/u TFTs

## 2022-02-03 NOTE — Assessment & Plan Note (Signed)
Lab Results  Component Value Date   HGBA1C 5.3 02/02/2022   Stable, pt to continue current medical treatment  - diet, wt control

## 2022-02-03 NOTE — Assessment & Plan Note (Signed)
Lab Results  Component Value Date   LDLCALC 145 (H) 02/02/2022   Uncontrolled, pt reqeusted lipids and labs today to assess diet efforts, does not want statin for now

## 2022-02-05 ENCOUNTER — Encounter: Payer: Self-pay | Admitting: Hematology and Oncology

## 2022-02-05 ENCOUNTER — Telehealth: Payer: Self-pay | Admitting: Radiation Oncology

## 2022-02-05 ENCOUNTER — Encounter: Payer: Self-pay | Admitting: Oncology

## 2022-02-05 ENCOUNTER — Inpatient Hospital Stay (HOSPITAL_BASED_OUTPATIENT_CLINIC_OR_DEPARTMENT_OTHER): Payer: Medicare Other | Admitting: Hematology and Oncology

## 2022-02-05 ENCOUNTER — Other Ambulatory Visit: Payer: Self-pay

## 2022-02-05 DIAGNOSIS — R590 Localized enlarged lymph nodes: Secondary | ICD-10-CM | POA: Diagnosis not present

## 2022-02-05 DIAGNOSIS — Z9079 Acquired absence of other genital organ(s): Secondary | ICD-10-CM | POA: Diagnosis not present

## 2022-02-05 DIAGNOSIS — C541 Malignant neoplasm of endometrium: Secondary | ICD-10-CM

## 2022-02-05 DIAGNOSIS — Z90710 Acquired absence of both cervix and uterus: Secondary | ICD-10-CM | POA: Diagnosis not present

## 2022-02-05 DIAGNOSIS — Z90722 Acquired absence of ovaries, bilateral: Secondary | ICD-10-CM | POA: Diagnosis not present

## 2022-02-05 DIAGNOSIS — Z9221 Personal history of antineoplastic chemotherapy: Secondary | ICD-10-CM | POA: Diagnosis not present

## 2022-02-05 NOTE — Progress Notes (Signed)
[facility] OFF[facility] PROGRESS NOTE  Patient Care Team: [name], MD as PCP - General (Internal Medicine) Belva Crome, MD as PCP - Cardiology (Cardiology) Axel Filler, MD  ASSESSMENT & PLAN:  Endometrial cancer [name]) I have reviewed multiple imaging studies with the patient Even though she is relatively asymptomatic, the pelvic lymph node is growing fast Due to proximity to vascular bundle, I am concerned about risk of leg edema and DVT if we continue to observe without treatment Previously, she tolerated treatment very poorly While palliative systemic chemotherapy is always available, I think we can consider referral to radiation oncologist for palliative radiation therapy, given she has only limited disease burden on 1 site I explained to the patient why surgery is not indicated After much discussion, she is willing to consider radiation treatment I will send referral and I will see her back in 8 weeks for further follow-up  No orders of the defined types were placed in this encounter.   All questions were answered. The patient knows to call the clinic with any problems, questions or concerns. The total time spent in the appointment was 30 minutes encounter with patients including review of chart and various tests results, discussions about plan of care and coordination of care plan   Tina Lark, MD 02/05/2022 8:36 AM  INTERVAL HISTORY: Please see below for problem oriented charting. she returns for review of test results Since last time I saw her, she is doing okay She denies abdominal pain or changes in bowel habits No leg swelling  REVIEW OF SYSTEMS:   Constitutional: Denies fevers, chills or abnormal weight loss Eyes: Denies blurriness of vision Ears, nose, mouth, throat, and face: Denies mucositis or sore throat Respiratory: Denies cough, dyspnea or wheezes Cardiovascular: Denies palpitation, chest discomfort or lower extremity  swelling Gastrointestinal:  Denies nausea, heartburn or change in bowel habits Skin: Denies abnormal skin rashes Lymphatics: Denies new lymphadenopathy or easy bruising Neurological:Denies numbness, tingling or new weaknesses Behavioral/Psych: Mood is stable, no new changes  All other systems were reviewed with the patient and are negative.  I have reviewed the past medical history, past surgical history, social history and family history with the patient and they are unchanged from previous note.  ALLERGIES:  is allergic to benicar hct [olmesartan medoxomil-hctz], iodinated contrast media, bactrim [sulfamethoxazole-trimethoprim], pravastatin, shellfish allergy, and amlodipine.  MEDICATIONS:  Current Outpatient Medications  Medication Sig Dispense Refill   acetaminophen (TYLENOL) 500 MG tablet Take 1,000 mg by mouth every 6 (six) hours as needed for moderate pain.     apixaban (ELIQUIS) 5 MG TABS tablet Take 1 tablet (5 mg total) by mouth 2 (two) times daily. 180 tablet 1   APPLE CIDER VINEGAR PO Take 5 mLs by mouth daily.      atenolol (TENORMIN) 25 MG tablet Take 1 tablet (25 mg total) by mouth daily. 90 tablet 1   cetirizine (ZYRTEC ALLERGY) 10 MG tablet Take 1 tablet twice daily.  If hives not controlled, increase to 2 tablets twice daily.  Do this until hives free x 3 weeks, then can start decreasing 1 tablet every 3-5 days until off. 120 tablet 4   Cholecalciferol (VITAMIN D3) 50 MCG (2000 UT) TABS Take by mouth.     EPINEPHrine (EPIPEN 2-PAK) 0.3 mg/0.3 mL IJ SOAJ injection Inject 0.3 mg into the muscle as needed for anaphylaxis. 1 each 2   fluticasone (FLONASE) 50 MCG/ACT nasal spray Place 2 sprays into both nostrils daily. [place]  g 6   levocetirizine (XYZAL) 5 MG tablet Can use up to 2 tablets in the morning and 2 tablets at night for hives/itching. This is maximum dose. Titrate as needed. 120 tablet 3   levothyroxine (SYNTHROID) 100 MCG tablet Take 100 mcg by mouth daily before  breakfast.     Multiple Vitamin (MULTIVITAMIN) capsule Take 1 capsule by mouth daily.     polyethylene glycol (MIRALAX / GLYCOLAX) 17 g packet Take 17 g by mouth daily as needed for mild constipation.     triamcinolone cream (KENALOG) 0.1 % Apply 1 application topically 2 (two) times daily. 30 g 1   No current facility-administered medications for this visit.    SUMMARY OF ONCOLOGIC HISTORY: Oncology History Overview Note  Poorly differentiated carcinoma, mixed histology with squamous differentiation, rare focus of clear cells as well as serous features MSI stable Her2 negative   Endometrial cancer ([place])  09/01/2019 Initial Diagnosis   The patient reported a history of postmenopausal bleeding that began 1 to 2 months before diagnosis   09/14/2019 Imaging   US pelvis 1. Enlarged uterus with numerous myometrial masses, presumably fibroids. 2. Endometrial thickness of 6.2 mm. In the setting of post-menopausal bleeding, endometrial sampling is indicated to exclude carcinoma. If results are benign, sonohysterogram should be considered for focal lesion work-up.  3. Nonvisualized ovaries   09/23/2019 Pathology Results   A. ENDOMETRIUM, BIOPSY:  - Poorly differentiated carcinoma   10/15/2019 Imaging   Ct scan of chest, abdomen and pelvis: No evidence of metastatic disease within the chest, abdomen, or pelvis.   Enlarged fibroid uterus.   Colonic diverticulosis. No radiographic evidence of diverticulitis.   Aortic and coronary artery atherosclerosis.     10/22/2019 Pathology Results   SURGICAL PATHOLOGY   FINAL MICROSCOPIC DIAGNOSIS:   A. UTERUS, BILATERAL TUBES AND OVARIES, HYSTERECTOMY:  Poorly differentiated carcinoma, 6.5 cm.  Lymphovascular involvement by tumor.  Carcinoma involves inner half of the myometrium.  Margins not involved.  Cervix, bilateral fallopian tubes and bilateral ovaries free of tumor.   B. LYMPH NODE, RIGHT EXTERNAL SENTINEL, BIOPSY:  One lymph node  with no metastatic carcinoma (0/1).   C. LYMPH NODE, RIGHT PELVIC, BIOPSY  Five lymph nodes with no metastatic carcinoma (0/5).   D. LYMPH NODE, LEFT PELVIC, BIOPSY:  Five lymph nodes with no metastatic carcinoma (0/5).   E. LYMPH NODE, RIGHT PERI AORTIC, BIOPSY:  One lymph node with no metastatic carcinoma (0/1).   F. LYMPH NODE, LEFT PERI AORTIC, BIOPSY:  Five lymph nodes with no metastatic carcinoma (0/5).    ONCOLOGY TABLE:  UTERUS, CARCINOMA OR CARCINOSARCOMA   Procedure: Total hysterectomy with bilateral f-oophorectomy and sentinel  lymph nodes.  Histologic type: Poorly differentiated carcinoma, see comment.  Histologic Grade: High-grade, FIGO 3.  Myometrial invasion:       Depth of invasion: 13 mm       Myometrial thickness: 40 mm  Uterine Serosa Involvement: Not identified  Cervical stromal involvement: Not identified  Extent of involvement of other organs: Not identified  Lymphovascular invasion: Present  Regional Lymph Nodes:       Examined:     17 Sentinel                               0 non-sentinel  17 total        Lymph nodes with metastasis: 0        Isolated tumor cells (<0.2 mm): 0        Micrometastasis:  (>0.2 mm and < 2.0 mm): 0        Macrometastasis: (>2.0 mm): 0  Representative Tumor Block: A5, A6, A7 and A8.  MMR / MSI testing: Pending  Pathologic Stage Classification (pTNM, AJCC 8th edition):  pT1a, pN0  Comments: The carcinoma is a high-grade poorly differentiated carcinoma which morphologically has predominantly serous features.  There are a few foci with squamous differentiation and a rare focus of clear cell features.  Immunohistochemistry for cytokeratin AE1/AE3 is performed on the sentinel lymph nodes and no positivity is identified.    10/22/2019 Surgery   Pre-operative Diagnosis: endometrial cancer grade 3   Post-operative Diagnosis: same,    Operation: Robotic-assisted laparoscopic total hysterectomy for  uterus >250gm with bilateral salpingoophorectomy, SLN mapping, bilateral pelvic and para-aortic lymphadenectomy.   Surgeon: Donaciano Eva    Operative Findings:  : 16cm bulky fibroid uterus, normal ovaries bilaterally, no suspicious lymph nodes.     11/10/2019 Cancer Staging   Staging form: Corpus Uteri - Carcinoma and Carcinosarcoma, AJCC 8th Edition - Pathologic: Stage IVB (pT1a, pN0, cM1) - Signed by Tina Lark, MD on 05/16/2020    05/10/2020 Imaging   1. New hypodense 2.0 cm segment 4A left liver lobe mass, suspicious for hepatic metastasis. 2. New left pelvic sidewall 1.4 cm soft tissue nodule, suspicious for left internal iliac nodal metastasis. 3. New left vaginal cuff 1.6 x 1.3 cm soft tissue nodule, suspicious for recurrent tumor. 4. Aortic Atherosclerosis (ICD10-I70.0).   05/18/2020 Procedure   Successful placement of a right internal jugular approach power injectable Port-A-Cath. The catheter is ready for immediate use.   05/23/2020 -  Chemotherapy   The patient had carboplatin and taxol for chemotherapy treatment.     07/28/2020 Imaging   1. No findings identified to suggest recurrent or metastatic disease. 2. Indeterminate and slightly exophytic lesion arising from the inferior pole of left kidney measures 0.8 cm. Technically this is too small to reliably characterize. Attention on follow-up imaging is advised. 3. Aortic atherosclerosis and LAD coronary artery atherosclerotic calcifications.   11/07/2020 Imaging   1. Interval progression of right pelvic sidewall lymphadenopathy, highly concerning for disease progression. PET-CT may prove helpful to further evaluate as clinically warranted. 2. Interval resolution of the previously identified hypodense lesion in segment IVA of the liver. 3. Stable 7 mm subcapsular lesion in the lower pole left kidney with attenuation higher than would be expected for a simple cyst but is too small to reliably characterize. Attention on  follow-up recommended. 4. Aortic Atherosclerosis (ICD10-I70.0).   11/16/2020 - 01/04/2021 Chemotherapy   She received Beryle Flock plus Lenvima       02/02/2021 Imaging   1. Significant interval decrease in size in right pelvic sidewall, iliac, and inguinal lymph nodes, consistent with treatment response of nodal metastatic disease. 2. Multiple low-attenuation lesions throughout the liver, unchanged compared to prior examination, the majority of these consistent with simple cysts. A previously noted metastatic lesion of anterior hepatic segment VII remains resolved. No new lesions. Findings are consistent with sustained treatment response of hepatic metastatic disease. 3. Status post hysterectomy. Unchanged post treatment appearance of left vaginal cuff and pelvic sidewall soft tissue, consistent with sustained treatment response. 4. No evidence of metastatic disease in the chest.   Aortic Atherosclerosis (ICD10-I70.0).  05/17/2021 Imaging   Mild right external iliac lymphadenopathy, without significant change. No new or progressive disease within the abdomen or pelvis.   Colonic diverticulosis. No radiographic evidence of diverticulitis.   Aortic Atherosclerosis (ICD10-I70.0).   11/03/2021 Imaging   Stable mild right external iliac lymphadenopathy. No new or progressive disease within the abdomen or pelvis.   Colonic diverticulosis. No radiographic evidence of diverticulitis.   Aortic Atherosclerosis (ICD10-I70.0).     02/05/2022 Imaging   1. Progressive enlargement of an external iliac lymph node on the right, suspicious for metastatic disease from the patient's endometrial cancer. This should be amenable to percutaneous biopsy. 2. No other evidence of progressive metastatic disease. Heterogeneous sclerosis of the proximal right femur is unchanged from recent prior studies, presumably treated metastatic disease. 3. Grossly stable probable cysts within the liver and kidneys. 4. Distal  colonic diverticulosis. 5.  Aortic Atherosclerosis (ICD10-I70.0).       PHYSICAL EXAMINATION: ECOG PERFORMANCE STATUS: 0 - Asymptomatic  Vitals:   02/05/22 0813  BP: 133/68  Pulse: 65  Resp: 18  Temp: 99 F (37.2 C)  SpO2: 100%   Filed Weights   02/05/22 0813  Weight: 199 lb 12.8 oz (90.6 kg)    GENERAL:alert, no distress and comfortable NEURO: alert & oriented x 3 with fluent speech, no focal motor/sensory deficits  LABORATORY DATA:  I have reviewed the data as listed    Component Value Date/Time   NA 138 02/02/2022 0733   NA 140 07/04/2017 0952   K 4.1 02/02/2022 0733   CL 103 02/02/2022 0733   CO2 27 02/02/2022 0733   GLUCOSE 99 02/02/2022 0733   BUN 13 02/02/2022 0733   BUN 10 07/04/2017 0952   CREATININE 0.82 02/02/2022 0733   CREATININE 0.81 03/23/2015 0946   CALCIUM 9.4 02/02/2022 0733   PROT 7.2 02/02/2022 0733   ALBUMIN 4.0 02/02/2022 0733   AST 16 02/02/2022 0733   ALT 10 02/02/2022 0733   ALKPHOS 99 02/02/2022 0733   BILITOT 0.4 02/02/2022 0733   GFRNONAA >60 02/02/2022 0733   GFRNONAA 78 03/23/2015 0946   GFRAA >60 09/12/2020 0853   GFRAA >89 03/23/2015 0946    No results found for: SPEP, UPEP  Lab Results  Component Value Date   WBC 3.7 (L) 02/02/2022   NEUTROABS 1.9 02/02/2022   HGB 11.7 (L) 02/02/2022   HCT 37.0 02/02/2022   MCV 71.8 (L) 02/02/2022   PLT 202 02/02/2022      Chemistry      Component Value Date/Time   NA 138 02/02/2022 0733   NA 140 07/04/2017 0952   K 4.1 02/02/2022 0733   CL 103 02/02/2022 0733   CO2 27 02/02/2022 0733   BUN 13 02/02/2022 0733   BUN 10 07/04/2017 0952   CREATININE 0.82 02/02/2022 0733   CREATININE 0.81 03/23/2015 0946      Component Value Date/Time   CALCIUM 9.4 02/02/2022 0733   ALKPHOS 99 02/02/2022 0733   AST 16 02/02/2022 0733   ALT 10 02/02/2022 0733   BILITOT 0.4 02/02/2022 0733       RADIOGRAPHIC STUDIES: I have reviewed multiple imaging studies with the patient I have  personally reviewed the radiological images as listed and agreed with the findings in the report. CT Abdomen Pelvis Wo Contrast  Result Date: 02/04/2022 CLINICAL DATA:  History of endometrial cancer.  Surveillance. EXAM: CT ABDOMEN AND PELVIS WITHOUT CONTRAST TECHNIQUE: Multidetector CT imaging of the abdomen and pelvis was performed following the standard  protocol without IV contrast. RADIATION DOSE REDUCTION: This exam was performed according to the departmental dose-optimization program which includes automated exposure control, adjustment of the mA and/or kV according to patient size and/or use of iterative reconstruction technique. COMPARISON:  Abdominopelvic CT 11/03/2021, 05/17/2021 and 02/02/2021. FINDINGS: Lower chest: Clear lung bases. No significant pleural or pericardial effusion. Hepatobiliary: Multiple low-density liver lesions are stable, likely cysts. No new or enlarging hepatic lesions are identified. No significant biliary dilatation status post cholecystectomy. Pancreas: Unremarkable. No pancreatic ductal dilatation or surrounding inflammatory changes. Spleen: Normal in size without focal abnormality. Adrenals/Urinary Tract: Both adrenal glands appear normal. No evidence of urinary tract calculus or hydronephrosis. Grossly stable small low-density lesion in the interpolar region of the right kidney and hyper attenuated lesion anteriorly in the lower interpolar region of the left kidney, likely cysts based on stability. The bladder appears unremarkable for its degree of distention. Stomach/Bowel: Enteric contrast was administered and has passed into the distal colon. The stomach appears unremarkable for its degree of distension. No evidence of bowel wall thickening, distention or surrounding inflammatory change. The appendix appears normal. There is prominent stool throughout the colon. Diverticular changes are present within the sigmoid colon. Vascular/Lymphatic: There is an enlarging right  external iliac node which measures 2.5 x 2.9 cm on image 71/2. Most recently, this measured 1.8 x 2.4 cm. Other small external iliac, pelvic sidewall, inguinal and retroperitoneal lymph nodes are stable. Mild aortoiliac atherosclerosis without acute vascular findings on noncontrast imaging. Reproductive: No ascites or peritoneal nodularity. Intact abdominal wall. Other: No evidence of abdominal wall mass or hernia. No ascites. Musculoskeletal: There is chronic heterogeneous sclerosis within the right femoral neck and intertrochanteric region which is similar to recent prior studies, presumably treated osseous metastatic disease. No lytic lesion or pathologic fracture identified. Stable multilevel lumbar spondylosis with associated foraminal narrowing, greatest on the right at L4-5. IMPRESSION: 1. Progressive enlargement of an external iliac lymph node on the right, suspicious for metastatic disease from the patient's endometrial cancer. This should be amenable to percutaneous biopsy. 2. No other evidence of progressive metastatic disease. Heterogeneous sclerosis of the proximal right femur is unchanged from recent prior studies, presumably treated metastatic disease. 3. Grossly stable probable cysts within the liver and kidneys. 4. Distal colonic diverticulosis. 5.  Aortic Atherosclerosis (ICD10-I70.0). Electronically Signed   By: Richardean Sale M.D.   On: 02/04/2022 12:19

## 2022-02-05 NOTE — Progress Notes (Signed)
Histology and Location of Primary Cancer: endometrium 09/23/2019 Pathology Results    A. ENDOMETRIUM, BIOPSY:  - Poorly differentiated carcinoma    Location(s) of Symptomatic tumor(s):  02/05/2022 Imaging    1. Progressive enlargement of an external iliac lymph node on the right, suspicious for metastatic disease from the patient's endometrial cancer. This should be amenable to percutaneous biopsy.    Past/Anticipated chemotherapy by medical oncology, if any: Dr Alvy Bimler 11/16/2020 - 01/04/2021 Chemotherapy    She received Beryle Flock plus Lenvima   05/23/2020 -  Chemotherapy    The patient had carboplatin and taxol for chemotherapy treatment.    Patient's main complaints related to symptomatic tumor(s) are: Dr Alvy Bimler - Even though she is relatively asymptomatic, the pelvic lymph node is growing fast Due to proximity to vascular bundle, I am concerned about risk of leg edema and DVT if we continue to observe without treatment  Pain on a scale of 0-10 is: 0   Ambulatory status? Walker? Wheelchair?: ambulatory  SAFETY ISSUES: Prior radiation? no Pacemaker/ICD? no Possible current pregnancy? hysterectomy Is the patient on methotrexate? no  Additional Complaints / other details:  gas   Vitals:   02/07/22 0746  BP: 138/79  Pulse: 68  Resp: 18  Temp: (!) 97.2 F (36.2 C)  TempSrc: Temporal  SpO2: 100%  Weight: 201 lb 8 oz (91.4 kg)  Height: 5\' 4"  (1.626 m)

## 2022-02-05 NOTE — Progress Notes (Signed)
Referral placed for Radiation Oncology to Dr. Sondra Come.

## 2022-02-05 NOTE — Assessment & Plan Note (Signed)
I have reviewed multiple imaging studies with the patient Even though she is relatively asymptomatic, the pelvic lymph node is growing fast Due to proximity to vascular bundle, I am concerned about risk of leg edema and DVT if we continue to observe without treatment Previously, she tolerated treatment very poorly While palliative systemic chemotherapy is always available, I think we can consider referral to radiation oncologist for palliative radiation therapy, given she has only limited disease burden on 1 site I explained to the patient why surgery is not indicated After much discussion, she is willing to consider radiation treatment I will send referral and I will see her back in 8 weeks for further follow-up

## 2022-02-05 NOTE — Telephone Encounter (Signed)
LVM to sched CON with Dr. Kinard 

## 2022-02-06 ENCOUNTER — Other Ambulatory Visit: Payer: Self-pay | Admitting: Internal Medicine

## 2022-02-06 DIAGNOSIS — I10 Essential (primary) hypertension: Secondary | ICD-10-CM

## 2022-02-06 DIAGNOSIS — I480 Paroxysmal atrial fibrillation: Secondary | ICD-10-CM

## 2022-02-06 NOTE — Progress Notes (Signed)
Radiation Oncology         [phone] ________________________________  Initial Outpatient Consultation  Name: Tina Stone MRN: [id number]  Date: 02/07/2022  DOB: 1950/10/18  FG:HWEXH, Arvid Right, MD  [name], MD   REFERRING PHYSICIAN: [name], MD  DIAGNOSIS: The primary encounter diagnosis was Endometrial cancer [facility]). A diagnosis of Metastasis to liver [facility]) was also pertinent to this visit.  History of Stage IVB Endometrial Cancer with liver metastasis, s/p chemotherapy, hysterectomy and BSO; now with recent evidence of metastatic disease to a right iliac lymph node  HISTORY OF PRESENT ILLNESS::Tina Stone is a 72 y.o. female who is seen as a courtesy of Dr. Alvy Bimler for an opinion concerning radiation therapy as part of management for recent progressive enlargement of a external right iliac lymph node.  The patient's cancer history spans back to 2020 when she presented with postmenopausal bleeding in September of 2020. Pelvic US on 09/14/2019 showed an enlarging uterus with numerous myometrial masses, presumed fibroids. Endometrial biopsy on 09/23/2019 showed poorly differentiated carcinoma. She proceeded to undergo hysterectomy, BSO, and nodal biopsies on 10/22/2019 which revealed FIGO grade 3 poorly differentiated carcinoma measuring 6.5 cm with lymphovascular involvement by tumor, and carcinoma involving the inner half of the myometrium. Bilateral fallopian tubes, ovaries, and cervix were negative for carcinoma. Nodal status of 17/17 lymph nodes examined negative for carcinoma.   CT of the abdomen and pelvis on 05/10/2020 showed a new hypodense 2.0 cm segment 4A left liver lobe mass, suspicious for hepatic metastasis; a new left pelvic sidewall 1.4 cm soft tissue nodule, suspicious for left internal iliac nodal metastasis; and a new left vaginal cuff 1.6 x 1.3 cm soft tissue nodule, suspicious for recurrent tumor.  Subsequently, she began chemotherapy consisting of  carboplatin and taxol on 05/23/2020 under the care of Dr. Alvy Bimler. This was discontinued on 11/08/2020 due to evidence of disease progression detailed below. Chemo toxicities encountered by the patient included skin rash, elevated BP, dizziness and pancytopenia (the patient however has chronic stable anemia due to her carrier status of the B-thalassemia trait).   CT of the chest abdomen and pelvis on 07/28/2020 showed no evidence of recurrent of metastatic disease. An indeterminate and slightly exophytic lesion was however appreciated arising from the inferior pole of left kidney, measuring 0.8 cm, though too small to reliably characterize.   The patient reported right hip pain x 2 months prompting a right hip XR on 10/26/2020 which showed increased asymmetric lucency involving the proximal right femur, especially involving the greater trochanter. Findings were noted as suspicious for an underlying lytic lesion, especially given the patient's history of prior malignancy.  CT of the abdomen and pelvis on 11/07/2020 showed interval progression of right pelvis sidewall lymphadenopathy, highly concerning for disease progression; interval resolution of the previously identified hypodense lesion in segment IVA of the liver; and a stable 7 mm subcapsular lesion in the lower pole left kidney, (with attenuation higher than would be expected for a simple cyst but too small to reliably characterize).   She proceeded to undergo further chemotherapy consisting of keytruda plus immunotherapy consisting lenvima on 11/16/2020 through 01/04/2021. During a visit with Dr. Alvy Bimler on 11/08/2020, the patient reported intermittent right hip pain and lymphadenopathy.   CT of the chest abdomen and pelvis on 02/02/2021 showed a significant interval decrease in size of the right pelvic sidewall, iliac, and inguinal lymph nodes, consistent with treatment response of nodal metastatic disease. Multiple low-attenuation lesions  throughout the liver were  also appreciated as unchanged compared to prior examination. The majority of these appeared consistent with simple cysts. A previously noted metastatic lesion of anterior hepatic segment VII was also seen as continuously resolved. No new lesions were appreciated overall; consistent with sustained treatment response of hepatic metastatic disease  Follow up CT's on 05/17/21 and 11/03/21 showed the mild right external iliac lymphadenopathy as unchanged. No new or progressive disease within the abdomen or pelvis was appreciated.  CT of the abdomen and pelvis on 02/05/2022 demonstrated progressive enlargement of an external iliac lymph node on the right, suspicious for metastatic disease. No other evidence of progressive metastatic disease was appreciated, and probable cysts within the liver and kidneys appeared stable.   Given these findings, the patient met with Dr. Alvy Bimler on 02/05/22 for further evaluation. The patient was noted as relatively asymptomatic even though the lymph node is growing fast. Given concerns of leg edema and DVT, and her history of tolerating systemic treatment somewhat poorly, Dr. Alvy Bimler recommended palliative radiation therapy, (given she has only limited disease burden at 1 site). Following discussion of the risks and benefits, the patient expressed that she will consider RT, and agreed to meet with me here today for discussion. The patient will return to Dr. Alvy Bimler in 8 weeks for follow-up.   PREVIOUS RADIATION THERAPY: No  PAST MEDICAL HISTORY:  Past Medical History:  Diagnosis Date   Adenomatous colon polyp    Allergic rhinitis, seasonal    Allergy    Beta thalassemia trait 11/25/2013   Cholelithiasis    Class 3 obesity without serious comorbidity with body mass index (BMI) of 40.0 to 44.9 in adult 11/19/2012   endometrial ca dx'd 08/2009   endometrial    GERD (gastroesophageal reflux disease)    HLD (hyperlipidemia)    Hypercholesterolemia     Hypertension 03/18/2017   no meds    Intraductal papilloma of left breast    Patient underwent left needle-localized lumpectomy by Dr. Imogene Burn. Tsuei on 09/09/2013; pathology showed intraductal papilloma with no atypia or malignancy identified.   PONV (postoperative nausea and vomiting)    Pre-diabetes    pt denies   Urticaria    Uterine fibroid     PAST SURGICAL HISTORY: Past Surgical History:  Procedure Laterality Date   ANKLE FRACTURE SURGERY  1966   right   BREAST EXCISIONAL BIOPSY Left 2014   benign   BREAST LUMPECTOMY WITH NEEDLE LOCALIZATION Left 09/09/2013   Procedure: BREAST LUMPECTOMY WITH NEEDLE LOCALIZATION;  Surgeon: Imogene Burn. Georgette Dover, MD;  Location: [place];  Service: General;  Laterality: Left;   CHOLECYSTECTOMY     COLONOSCOPY     IR IMAGING GUIDED PORT INSERTION  05/18/2020   POLYPECTOMY     ROBOTIC ASSISTED LAPAROSCOPIC CHOLECYSTECTOMY  09/09/2019   ROBOTIC ASSISTED TOTAL HYSTERECTOMY WITH BILATERAL SALPINGO OOPHERECTOMY N/A 10/22/2019   Procedure: XI ROBOTIC ASSISTED TOTAL HYSTERECTOMY WITH BILATERAL SALPINGO OOPHORECTOMY GREATER THAN 250 GRAMS, MINI LAPARTOMY FOR SPECIMEN DELIVERY; PELVIC AND PERI-AORTIC LYMPHADENECTOMY;  Surgeon: Everitt Amber, MD;  Location: [facility];  Service: Gynecology;  Laterality: N/A;   SENTINEL NODE BIOPSY N/A 10/22/2019   Procedure: SENTINEL NODE BIOPSY;  Surgeon: Everitt Amber, MD;  Location: [facility];  Service: Gynecology;  Laterality: N/A;    FAMILY HISTORY:  Family History  Problem Relation Age of Onset   Diabetes Mother    Hypertension Mother    Colon polyps Mother 42   Dementia Mother 86   Diabetes Father    Congestive Heart Failure  Father    Asthma Brother    Pancreatic cancer Paternal Aunt    Asthma Paternal Grandmother    Colon cancer Neg Hx    Breast cancer Neg Hx    Lung cancer Neg Hx    Esophageal cancer Neg Hx    Rectal cancer Neg Hx    Stomach cancer Neg Hx     SOCIAL HISTORY:  Social History   Tobacco Use    Smoking status: Never   Smokeless tobacco: Never   Tobacco comments:    few puffs but not a true smoker quit many yrs ago  Vaping Use   Vaping Use: Never used  Substance Use Topics   Alcohol use: Never   Drug use: Never    ALLERGIES:  Allergies  Allergen Reactions   Benicar Hct [Olmesartan Medoxomil-Hctz] Shortness Of Breath and Palpitations   Iodinated Contrast Media Hives and Itching    02/02/2021-  pt developed 2 hives and itching even with the 13hr premedication.  Radiology PA came into eval the pt.  Developed itching and hives after injection on 05/10/20; needs 13hr prep in future   Bactrim [Sulfamethoxazole-Trimethoprim] Other (See Comments)    Abdominal pain, dizziness   Pravastatin Other (See Comments)    Lower abdominal pain   Shellfish Allergy Nausea And Vomiting   Amlodipine Palpitations    MEDICATIONS:  Current Outpatient Medications  Medication Sig Dispense Refill   acetaminophen (TYLENOL) 500 MG tablet Take 1,000 mg by mouth every 6 (six) hours as needed for moderate pain.     apixaban (ELIQUIS) 5 MG TABS tablet Take 1 tablet (5 mg total) by mouth 2 (two) times daily. 180 tablet 1   APPLE CIDER VINEGAR PO Take 5 mLs by mouth daily.      atenolol (TENORMIN) 25 MG tablet TAKE 1 TABLET (25 MG TOTAL) BY MOUTH DAILY. 90 tablet 1   Cholecalciferol (VITAMIN D3) 50 MCG (2000 UT) TABS Take by mouth.     fluticasone (FLONASE) 50 MCG/ACT nasal spray Place 2 sprays into both nostrils daily. 16 g 6   levocetirizine (XYZAL) 5 MG tablet Can use up to 2 tablets in the morning and 2 tablets at night for hives/itching. This is maximum dose. Titrate as needed. 120 tablet 3   levothyroxine (SYNTHROID) 100 MCG tablet Take 100 mcg by mouth daily before breakfast.     Multiple Vitamin (MULTIVITAMIN) capsule Take 1 capsule by mouth daily.     polyethylene glycol (MIRALAX / GLYCOLAX) 17 g packet Take 17 g by mouth daily as needed for mild constipation.     triamcinolone cream (KENALOG) 0.1  % Apply 1 application topically 2 (two) times daily. 30 g 1   cetirizine (ZYRTEC ALLERGY) 10 MG tablet Take 1 tablet twice daily.  If hives not controlled, increase to 2 tablets twice daily.  Do this until hives free x 3 weeks, then can start decreasing 1 tablet every 3-5 days until off. (Patient not taking: Reported on 02/07/2022) 120 tablet 4   EPINEPHrine (EPIPEN 2-PAK) 0.3 mg/0.3 mL IJ SOAJ injection Inject 0.3 mg into the muscle as needed for anaphylaxis. (Patient not taking: Reported on 02/07/2022) 1 each 2   No current facility-administered medications for this encounter.    REVIEW OF SYSTEMS:  A 10+ POINT REVIEW OF SYSTEMS WAS OBTAINED including neurology, dermatology, psychiatry, cardiac, respiratory, lymph, extremities, GI, GU, musculoskeletal, constitutional, reproductive, HEENT.  She reports some vaginal discharge but no bleeding.  She denies any pain along the right pelvis area  or problems with weakness in the right lower extremity or swelling in the right lower extremity.   PHYSICAL EXAM:  height is '5\' 4"'  (1.626 m) and weight is 201 lb 8 oz (91.4 kg). Her temporal temperature is 97.2 F (36.2 C) (abnormal). Her blood pressure is 138/79 and her pulse is 68. Her respiration is 18 and oxygen saturation is 100%.   General: Alert and oriented, in no acute distress HEENT: Head is normocephalic. Extraocular movements are intact.  Neck: Neck is supple, no palpable cervical or supraclavicular lymphadenopathy. Heart: Regular in rate and rhythm with no murmurs, rubs, or gallops. Chest: Clear to auscultation bilaterally, with no rhonchi, wheezes, or rales. Abdomen: Soft, nontender, nondistended, with no rigidity or guarding. Extremities: No cyanosis or edema. Lymphatics: see Neck Exam Skin: No concerning lesions. Musculoskeletal: symmetric strength and muscle tone throughout. Neurologic: Cranial nerves II through XII are grossly intact. No obvious focalities. Speech is fluent. Coordination is  intact. Psychiatric: Judgment and insight are intact. Affect is appropriate. On pelvic examination the external genitalia unremarkable.  A speculum exam is performed.  There are no mucosal lesions noted in the vaginal vault.  In the upper vaginal area there is some mild yellowish discharge.  No lesions noted along the vaginal cuff.  On bimanual examination and the vaginal cuff is intact.  No obvious pelvic palpable pelvic masses.  ECOG = 1  0 - Asymptomatic (Fully active, able to carry on all predisease activities without restriction)  1 - Symptomatic but completely ambulatory (Restricted in physically strenuous activity but ambulatory and able to carry out work of a light or sedentary nature. For example, light housework, office work)  2 - Symptomatic, <50% in bed during the day (Ambulatory and capable of all self care but unable to carry out any work activities. Up and about more than 50% of waking hours)  3 - Symptomatic, >50% in bed, but not bedbound (Capable of only limited self-care, confined to bed or chair 50% or more of waking hours)  4 - Bedbound (Completely disabled. Cannot carry on any self-care. Totally confined to bed or chair)  5 - Death   Eustace Pen MM, Creech RH, Tormey DC, et al. [phone]). "Toxicity and response criteria of the [facility] Group". [place] Oncol. 5 (6): 649-55  LABORATORY DATA:  Lab Results  Component Value Date   WBC 3.7 (L) 02/02/2022   HGB 11.7 (L) 02/02/2022   HCT 37.0 02/02/2022   MCV 71.8 (L) 02/02/2022   PLT 202 02/02/2022   NEUTROABS 1.9 02/02/2022   Lab Results  Component Value Date   NA 138 02/02/2022   K 4.1 02/02/2022   CL 103 02/02/2022   CO2 27 02/02/2022   GLUCOSE 99 02/02/2022   CREATININE 0.82 02/02/2022   CALCIUM 9.4 02/02/2022      RADIOGRAPHY: CT Abdomen Pelvis Wo Contrast  Result Date: 02/04/2022 CLINICAL DATA:  History of endometrial cancer.  Surveillance. EXAM: CT ABDOMEN AND PELVIS WITHOUT CONTRAST  TECHNIQUE: Multidetector CT imaging of the abdomen and pelvis was performed following the standard protocol without IV contrast. RADIATION DOSE REDUCTION: This exam was performed according to the departmental dose-optimization program which includes automated exposure control, adjustment of the mA and/or kV according to patient size and/or use of iterative reconstruction technique. COMPARISON:  Abdominopelvic CT 11/03/2021, 05/17/2021 and 02/02/2021. FINDINGS: Lower chest: Clear lung bases. No significant pleural or pericardial effusion. Hepatobiliary: Multiple low-density liver lesions are stable, likely cysts. No new or enlarging hepatic lesions are  identified. No significant biliary dilatation status post cholecystectomy. Pancreas: Unremarkable. No pancreatic ductal dilatation or surrounding inflammatory changes. Spleen: Normal in size without focal abnormality. Adrenals/Urinary Tract: Both adrenal glands appear normal. No evidence of urinary tract calculus or hydronephrosis. Grossly stable small low-density lesion in the interpolar region of the right kidney and hyper attenuated lesion anteriorly in the lower interpolar region of the left kidney, likely cysts based on stability. The bladder appears unremarkable for its degree of distention. Stomach/Bowel: Enteric contrast was administered and has passed into the distal colon. The stomach appears unremarkable for its degree of distension. No evidence of bowel wall thickening, distention or surrounding inflammatory change. The appendix appears normal. There is prominent stool throughout the colon. Diverticular changes are present within the sigmoid colon. Vascular/Lymphatic: There is an enlarging right external iliac node which measures 2.5 x 2.9 cm on image 71/2. Most recently, this measured 1.8 x 2.4 cm. Other small external iliac, pelvic sidewall, inguinal and retroperitoneal lymph nodes are stable. Mild aortoiliac atherosclerosis without acute vascular  findings on noncontrast imaging. Reproductive: No ascites or peritoneal nodularity. Intact abdominal wall. Other: No evidence of abdominal wall mass or hernia. No ascites. Musculoskeletal: There is chronic heterogeneous sclerosis within the right femoral neck and intertrochanteric region which is similar to recent prior studies, presumably treated osseous metastatic disease. No lytic lesion or pathologic fracture identified. Stable multilevel lumbar spondylosis with associated foraminal narrowing, greatest on the right at L4-5. IMPRESSION: 1. Progressive enlargement of an external iliac lymph node on the right, suspicious for metastatic disease from the patient's endometrial cancer. This should be amenable to percutaneous biopsy. 2. No other evidence of progressive metastatic disease. Heterogeneous sclerosis of the proximal right femur is unchanged from recent prior studies, presumably treated metastatic disease. 3. Grossly stable probable cysts within the liver and kidneys. 4. Distal colonic diverticulosis. 5.  Aortic Atherosclerosis (ICD10-I70.0). Electronically Signed   By: Richardean Sale M.D.   On: 02/04/2022 12:19      IMPRESSION: History of Stage IVB Endometrial Cancer with liver metastasis, s/p chemotherapy, hysterectomy and BSO; now with recent evidence of metastatic disease to a right iliac lymph node  She appears to have isolated area of progression in the right pelvis specifically the right external iliac region.  This lymph node is now enlarged to 2.9 x 2.5 cm.  With further progression this potentially could result in right lower extremity edema or possible DVT.  I would recommend radiation therapy to this solitary area.  Given its close proximity to the bowel we would not be able to proceed with SBRT.  She would be a candidate for fractionated radiation therapy over approximately 5 to 7/2 weeks of treatment.  Today, I talked to the patient about the findings and work-up thus far.  We discussed  the natural history of recurrent endometrial cancer and general treatment, highlighting the role of radiotherapy in the management.  We discussed the available radiation techniques, and focused on the details of logistics and delivery.  We reviewed the anticipated acute and late sequelae associated with radiation in this setting.  The patient was encouraged to ask questions that I answered to the best of my ability.  A patient consent form was discussed and signed.  We retained a copy for our records.  The patient would like to proceed with radiation and will be scheduled for CT simulation.  PLAN: She will return tomorrow for CT simulation.  Treatments to begin approximately a week.  Anticipate 5-1/2 weeks of radiation  therapy.  We will use intensity modulated radiation therapy to limit dose normal surrounding critical structures.   60 minutes of total time was spent for this patient encounter, including preparation, face-to-face counseling with the patient and coordination of care, physical exam, and documentation of the encounter.   ------------------------------------------------  Blair Promise, PhD, MD  This document serves as a record of services personally performed by Gery Pray, MD. It was created on his behalf by Roney Mans, a trained medical scribe. The creation of this record is based on the scribe's personal observations and the provider's statements to them. This document has been checked and approved by the attending provider.

## 2022-02-07 ENCOUNTER — Ambulatory Visit
Admission: RE | Admit: 2022-02-07 | Discharge: 2022-02-07 | Disposition: A | Discharge status: Home / Self Care | Payer: Medicare Other | Source: Ambulatory Visit | Attending: Radiation Oncology | Admitting: Radiation Oncology

## 2022-02-07 ENCOUNTER — Other Ambulatory Visit: Payer: Self-pay

## 2022-02-07 ENCOUNTER — Encounter: Payer: Self-pay | Admitting: Radiation Oncology

## 2022-02-07 VITALS — BP 138/79 | HR 68 | Temp 97.2°F | Resp 18 | Ht 64.0 in | Wt 201.5 lb

## 2022-02-07 DIAGNOSIS — K5730 Diverticulosis of large intestine without perforation or abscess without bleeding: Secondary | ICD-10-CM | POA: Insufficient documentation

## 2022-02-07 DIAGNOSIS — C541 Malignant neoplasm of endometrium: Secondary | ICD-10-CM | POA: Diagnosis not present

## 2022-02-07 DIAGNOSIS — R030 Elevated blood-pressure reading, without diagnosis of hypertension: Secondary | ICD-10-CM | POA: Insufficient documentation

## 2022-02-07 DIAGNOSIS — I708 Atherosclerosis of other arteries: Secondary | ICD-10-CM | POA: Diagnosis not present

## 2022-02-07 DIAGNOSIS — D563 Thalassemia minor: Secondary | ICD-10-CM | POA: Diagnosis not present

## 2022-02-07 DIAGNOSIS — Z79899 Other long term (current) drug therapy: Secondary | ICD-10-CM | POA: Diagnosis not present

## 2022-02-07 DIAGNOSIS — C787 Secondary malignant neoplasm of liver and intrahepatic bile duct: Secondary | ICD-10-CM | POA: Insufficient documentation

## 2022-02-07 DIAGNOSIS — Z7901 Long term (current) use of anticoagulants: Secondary | ICD-10-CM | POA: Diagnosis not present

## 2022-02-07 DIAGNOSIS — E7800 Pure hypercholesterolemia, unspecified: Secondary | ICD-10-CM | POA: Diagnosis not present

## 2022-02-07 DIAGNOSIS — R42 Dizziness and giddiness: Secondary | ICD-10-CM | POA: Insufficient documentation

## 2022-02-07 DIAGNOSIS — Z86010 Personal history of colonic polyps: Secondary | ICD-10-CM | POA: Diagnosis not present

## 2022-02-07 DIAGNOSIS — R21 Rash and other nonspecific skin eruption: Secondary | ICD-10-CM | POA: Diagnosis not present

## 2022-02-07 DIAGNOSIS — Z90710 Acquired absence of both cervix and uterus: Secondary | ICD-10-CM | POA: Diagnosis not present

## 2022-02-07 DIAGNOSIS — I10 Essential (primary) hypertension: Secondary | ICD-10-CM | POA: Diagnosis not present

## 2022-02-07 DIAGNOSIS — E785 Hyperlipidemia, unspecified: Secondary | ICD-10-CM | POA: Diagnosis not present

## 2022-02-07 DIAGNOSIS — K219 Gastro-esophageal reflux disease without esophagitis: Secondary | ICD-10-CM | POA: Insufficient documentation

## 2022-02-07 NOTE — Progress Notes (Signed)
See MD note for nursing evaluation. °

## 2022-02-08 ENCOUNTER — Ambulatory Visit
Admission: RE | Admit: 2022-02-08 | Discharge: 2022-02-08 | Disposition: A | Discharge status: Home / Self Care | Payer: Medicare Other | Source: Ambulatory Visit | Attending: Radiation Oncology | Admitting: Radiation Oncology

## 2022-02-08 DIAGNOSIS — C541 Malignant neoplasm of endometrium: Secondary | ICD-10-CM | POA: Insufficient documentation

## 2022-02-08 DIAGNOSIS — Z510 Encounter for antineoplastic radiation therapy: Secondary | ICD-10-CM | POA: Insufficient documentation

## 2022-02-08 DIAGNOSIS — C775 Secondary and unspecified malignant neoplasm of intrapelvic lymph nodes: Secondary | ICD-10-CM | POA: Insufficient documentation

## 2022-02-08 DIAGNOSIS — C787 Secondary malignant neoplasm of liver and intrahepatic bile duct: Secondary | ICD-10-CM | POA: Diagnosis not present

## 2022-02-14 DIAGNOSIS — C787 Secondary malignant neoplasm of liver and intrahepatic bile duct: Secondary | ICD-10-CM | POA: Diagnosis not present

## 2022-02-14 DIAGNOSIS — Z510 Encounter for antineoplastic radiation therapy: Secondary | ICD-10-CM | POA: Diagnosis not present

## 2022-02-14 DIAGNOSIS — C775 Secondary and unspecified malignant neoplasm of intrapelvic lymph nodes: Secondary | ICD-10-CM | POA: Diagnosis not present

## 2022-02-16 ENCOUNTER — Other Ambulatory Visit: Payer: Self-pay

## 2022-02-16 ENCOUNTER — Encounter (HOSPITAL_COMMUNITY): Payer: Self-pay

## 2022-02-16 ENCOUNTER — Ambulatory Visit (HOSPITAL_COMMUNITY)
Admission: EM | Admit: 2022-02-16 | Discharge: 2022-02-16 | Disposition: A | Discharge status: Home / Self Care | Payer: Medicare Other | Attending: Physician Assistant | Admitting: Physician Assistant

## 2022-02-16 DIAGNOSIS — Z20822 Contact with and (suspected) exposure to covid-19: Secondary | ICD-10-CM | POA: Diagnosis not present

## 2022-02-16 DIAGNOSIS — R051 Acute cough: Secondary | ICD-10-CM | POA: Insufficient documentation

## 2022-02-16 LAB — SARS CORONAVIRUS 2 (TAT 6-24 HRS): SARS Coronavirus 2: NEGATIVE

## 2022-02-16 LAB — POC INFLUENZA A AND B ANTIGEN (URGENT CARE ONLY)
INFLUENZA A ANTIGEN, POC: NEGATIVE
INFLUENZA B ANTIGEN, POC: NEGATIVE

## 2022-02-16 NOTE — Discharge Instructions (Signed)
Your covid test is pending 

## 2022-02-16 NOTE — ED Triage Notes (Signed)
Pt presents for covid exposure X 5 days ago.

## 2022-02-16 NOTE — ED Provider Notes (Signed)
[place]    CSN: [id number] Arrival date & time: 02/16/22  2355      History   Chief Complaint Chief Complaint  Patient presents with   Covid Exposure    HPI Lateisha Thurlow is a 72 y.o. female.   The history is provided by the patient. No language interpreter was used.  Cough Cough characteristics:  Non-productive Sputum characteristics:  Nondescript Severity:  Moderate Onset quality:  Gradual Duration:  2 days Progression:  Unchanged Smoker: no   Relieved by:  Nothing Ineffective treatments:  None tried Associated symptoms: no fever    Past Medical History:  Diagnosis Date   Adenomatous colon polyp    Allergic rhinitis, seasonal    Allergy    Beta thalassemia trait 11/25/2013   Cholelithiasis    Class 3 obesity without serious comorbidity with body mass index (BMI) of 40.0 to 44.9 in adult 11/19/2012   endometrial ca dx'd 08/2009   endometrial    GERD (gastroesophageal reflux disease)    HLD (hyperlipidemia)    Hypercholesterolemia    Hypertension 03/18/2017   no meds    Intraductal papilloma of left breast    Patient underwent left needle-localized lumpectomy by Dr. Imogene Burn. Tsuei on 09/09/2013; pathology showed intraductal papilloma with no atypia or malignancy identified.   PONV (postoperative nausea and vomiting)    Pre-diabetes    pt denies   Urticaria    Uterine fibroid     Patient Active Problem List   Diagnosis Date Noted   Hyperthyroidism 02/03/2022   Chronic idiopathic urticaria 11/22/2021   Chronic anticoagulation 10/02/2021   Hives 10/02/2021   Visit for screening mammogram 08/18/2021   Acquired hypothyroidism 08/18/2021   Allergic rhinoconjunctivitis 08/18/2021   Encounter for general adult medical examination with abnormal findings 08/18/2021   Hyperlipidemia with target LDL less than 100 08/17/2021   Laryngitis, chronic 08/17/2021   Paroxysmal atrial fibrillation ([place]) 02/03/2021   Secondary hypercoagulable state  ([place]) 02/03/2021   Essential hypertension 11/16/2020   Vitamin D deficiency 10/27/2020   Metastasis to liver ([place]) 06/14/2020   Goals of care, counseling/discussion 05/16/2020   Endometrial cancer ([place]) 10/22/2019   Viral URI 04/01/2018   Prediabetes 03/18/2017   Beta thalassemia trait 11/25/2013   Class II obesity 11/19/2012   Gastroesophageal reflux disease 01/28/2007    Past Surgical History:  Procedure Laterality Date   ANKLE FRACTURE SURGERY  1966   right   BREAST EXCISIONAL BIOPSY Left 2014   benign   BREAST LUMPECTOMY WITH NEEDLE LOCALIZATION Left 09/09/2013   Procedure: BREAST LUMPECTOMY WITH NEEDLE LOCALIZATION;  Surgeon: Imogene Burn. Georgette Dover, MD;  Location: [facility];  Service: General;  Laterality: Left;   CHOLECYSTECTOMY     COLONOSCOPY     IR IMAGING GUIDED PORT INSERTION  05/18/2020   POLYPECTOMY     ROBOTIC ASSISTED LAPAROSCOPIC CHOLECYSTECTOMY  09/09/2019   ROBOTIC ASSISTED TOTAL HYSTERECTOMY WITH BILATERAL SALPINGO OOPHERECTOMY N/A 10/22/2019   Procedure: XI ROBOTIC ASSISTED TOTAL HYSTERECTOMY WITH BILATERAL SALPINGO OOPHORECTOMY GREATER THAN 250 GRAMS, MINI LAPARTOMY FOR SPECIMEN DELIVERY; PELVIC AND PERI-AORTIC LYMPHADENECTOMY;  Surgeon: Everitt Amber, MD;  Location: [facility];  Service: Gynecology;  Laterality: N/A;   SENTINEL NODE BIOPSY N/A 10/22/2019   Procedure: SENTINEL NODE BIOPSY;  Surgeon: Everitt Amber, MD;  Location: [facility];  Service: Gynecology;  Laterality: N/A;    OB History     Gravida  1   Para  0   Term  0   Preterm  0  AB  1   Living  0      SAB  0   IAB  1   Ectopic  0   Multiple  0   Live Births  0            Home Medications    Prior to Admission medications   Medication Sig Start Date End Date Taking? Authorizing Provider  acetaminophen (TYLENOL) 500 MG tablet Take 1,000 mg by mouth every 6 (six) hours as needed for moderate pain.    [provider]  apixaban (ELIQUIS) 5 MG TABS tablet Take 1 tablet (5 mg total) by  mouth 2 (two) times daily. 08/17/21   [name], MD  APPLE CIDER VINEGAR PO Take 5 mLs by mouth daily.     [provider]  atenolol (TENORMIN) 25 MG tablet TAKE 1 TABLET (25 MG TOTAL) BY MOUTH DAILY. 02/06/22   [name], MD  cetirizine (ZYRTEC ALLERGY) 10 MG tablet Take 1 tablet twice daily.  If hives not controlled, increase to 2 tablets twice daily.  Do this until hives free x 3 weeks, then can start decreasing 1 tablet every 3-5 days until off. Patient not taking: Reported on 02/07/2022 10/20/21   Sigurd Sos, MD  Cholecalciferol (VITAMIN D3) 50 MCG (2000 UT) TABS Take by mouth.    [provider]  EPINEPHrine (EPIPEN 2-PAK) 0.3 mg/0.3 mL IJ SOAJ injection Inject 0.3 mg into the muscle as needed for anaphylaxis. Patient not taking: Reported on 02/07/2022 10/02/21   Biagio Borg, MD  fluticasone [facility]) 50 MCG/ACT nasal spray Place 2 sprays into both nostrils daily. 12/15/21   Hoyt Koch, MD  levocetirizine (XYZAL) 5 MG tablet Can use up to 2 tablets in the morning and 2 tablets at night for hives/itching. This is maximum dose. Titrate as needed. 11/22/21   Sigurd Sos, MD  levothyroxine (SYNTHROID) 100 MCG tablet Take 100 mcg by mouth daily before breakfast.    [provider]  Multiple Vitamin (MULTIVITAMIN) capsule Take 1 capsule by mouth daily.    [provider]  polyethylene glycol (MIRALAX / GLYCOLAX) 17 g packet Take 17 g by mouth daily as needed for mild constipation.    [provider]  triamcinolone cream (KENALOG) 0.1 % Apply 1 application topically 2 (two) times daily. 09/12/21 09/12/22  Biagio Borg, MD  atorvastatin (LIPITOR) 20 MG tablet Take 1 tablet (20 mg total) by mouth daily. 05/03/20 07/01/20  Erline Hau, MD    Family History Family History  Problem Relation Age of Onset   Diabetes Mother    Hypertension Mother    Colon polyps Mother 52   Dementia Mother 81   Diabetes Father    Congestive  Heart Failure Father    Asthma Brother    Pancreatic cancer Paternal Aunt    Asthma Paternal Grandmother    Colon cancer Neg Hx    Breast cancer Neg Hx    Lung cancer Neg Hx    Esophageal cancer Neg Hx    Rectal cancer Neg Hx    Stomach cancer Neg Hx     Social History Social History   Tobacco Use   Smoking status: Never   Smokeless tobacco: Never   Tobacco comments:    few puffs but not a true smoker quit many yrs ago  Vaping Use   Vaping Use: Never used  Substance Use Topics   Alcohol use: Never   Drug use: Never  Allergies   Benicar hct [olmesartan medoxomil-hctz], Iodinated contrast media, Bactrim [sulfamethoxazole-trimethoprim], Pravastatin, Shellfish allergy, and Amlodipine   Review of Systems Review of Systems  Constitutional:  Negative for fever.  Respiratory:  Positive for cough.   All other systems reviewed and are negative.   Physical Exam Triage Vital Signs ED Triage Vitals  Enc Vitals Group     BP 02/16/22 0841 125/81     Pulse Rate 02/16/22 0841 67     Resp 02/16/22 0841 17     Temp 02/16/22 0841 98.3 F (36.8 C)     Temp Source 02/16/22 0841 Oral     SpO2 02/16/22 0841 98 %     Weight --      Height --      Head Circumference --      Peak Flow --      Pain Score 02/16/22 0843 0     Pain Loc --      Pain Edu? --      Excl. in [place]? --    No data found.  Updated Vital Signs BP 125/81 (BP Location: Right Arm)    Pulse 67    Temp 98.3 F (36.8 C) (Oral)    Resp 17    SpO2 98%   Visual Acuity Right Eye Distance:   Left Eye Distance:   Bilateral Distance:    Right Eye Near:   Left Eye Near:    Bilateral Near:     Physical Exam Vitals and nursing note reviewed.  Constitutional:      Appearance: She is well-developed.  HENT:     Head: Normocephalic.     Mouth/Throat:     Mouth: Mucous membranes are moist.  Cardiovascular:     Rate and Rhythm: Normal rate.  Pulmonary:     Effort: Pulmonary effort is normal.  Abdominal:      General: There is no distension.  Musculoskeletal:        General: Normal range of motion.     Cervical back: Normal range of motion.  Skin:    General: Skin is warm.  Neurological:     General: No focal deficit present.     Mental Status: She is alert and oriented to person, place, and time.     UC Treatments / Results  Labs (all labs ordered are listed, but only abnormal results are displayed) Labs Reviewed  SARS CORONAVIRUS 2 (TAT 6-24 HRS)  POC INFLUENZA A AND B ANTIGEN (URGENT CARE ONLY)    EKG   Radiology No results found.  Procedures Procedures (including critical care time)  Medications Ordered in UC Medications - No data to display  Initial Impression / Assessment and Plan / UC Course  I have reviewed the triage vital signs and the nursing notes.  Pertinent labs & imaging results that were available during my care of the patient were reviewed by me and considered in my medical decision making (see chart for details).     COvid and influenza pending  Final Clinical Impressions(s) / UC Diagnoses   Final diagnoses:  Acute cough  Close exposure to COVID-19 virus     Discharge Instructions      Your covid test is pending   ED Prescriptions   None    PDMP not reviewed this encounter. An After Visit Summary was printed and given to the patient.    Fransico Meadow, [place] 02/16/22 [id number]

## 2022-02-19 ENCOUNTER — Ambulatory Visit
Admission: RE | Admit: 2022-02-19 | Discharge: 2022-02-19 | Disposition: A | Discharge status: Home / Self Care | Payer: Medicare Other | Source: Ambulatory Visit | Attending: Radiation Oncology | Admitting: Radiation Oncology

## 2022-02-19 ENCOUNTER — Ambulatory Visit: Payer: Medicare Other | Admitting: Internal Medicine

## 2022-02-19 ENCOUNTER — Other Ambulatory Visit: Payer: Self-pay

## 2022-02-19 DIAGNOSIS — C775 Secondary and unspecified malignant neoplasm of intrapelvic lymph nodes: Secondary | ICD-10-CM | POA: Diagnosis not present

## 2022-02-19 DIAGNOSIS — C541 Malignant neoplasm of endometrium: Secondary | ICD-10-CM

## 2022-02-19 DIAGNOSIS — C787 Secondary malignant neoplasm of liver and intrahepatic bile duct: Secondary | ICD-10-CM | POA: Diagnosis not present

## 2022-02-19 DIAGNOSIS — Z510 Encounter for antineoplastic radiation therapy: Secondary | ICD-10-CM | POA: Diagnosis not present

## 2022-02-20 ENCOUNTER — Ambulatory Visit (INDEPENDENT_AMBULATORY_CARE_PROVIDER_SITE_OTHER): Payer: Medicare Other | Admitting: Internal Medicine

## 2022-02-20 ENCOUNTER — Ambulatory Visit
Admission: RE | Admit: 2022-02-20 | Discharge: 2022-02-20 | Disposition: A | Discharge status: Home / Self Care | Payer: Medicare Other | Source: Ambulatory Visit | Attending: Radiation Oncology | Admitting: Radiation Oncology

## 2022-02-20 ENCOUNTER — Encounter: Payer: Self-pay | Admitting: Internal Medicine

## 2022-02-20 VITALS — BP 118/76 | HR 70 | Temp 98.0°F | Ht 64.0 in | Wt 203.0 lb

## 2022-02-20 DIAGNOSIS — G4452 New daily persistent headache (NDPH): Secondary | ICD-10-CM | POA: Diagnosis not present

## 2022-02-20 DIAGNOSIS — C787 Secondary malignant neoplasm of liver and intrahepatic bile duct: Secondary | ICD-10-CM | POA: Diagnosis not present

## 2022-02-20 DIAGNOSIS — C775 Secondary and unspecified malignant neoplasm of intrapelvic lymph nodes: Secondary | ICD-10-CM | POA: Diagnosis not present

## 2022-02-20 DIAGNOSIS — Z510 Encounter for antineoplastic radiation therapy: Secondary | ICD-10-CM | POA: Diagnosis not present

## 2022-02-20 DIAGNOSIS — C541 Malignant neoplasm of endometrium: Secondary | ICD-10-CM | POA: Diagnosis not present

## 2022-02-20 NOTE — Patient Instructions (Signed)
General Headache Without Cause A headache is pain or discomfort felt around the head or neck area. There are many causes and types of headaches. A few common types include: Tension headaches. Migraine headaches. Cluster headaches. Chronic daily headaches. Sometimes, the specific cause of a headache may not be found. Follow these instructions at home: Watch your condition for any changes. Let your health care provider know about them. Take these steps to help with your condition: Managing pain   Take over-the-counter and prescription medicines only as told by your health care provider. Treatment may include medicines for pain that are taken by mouth or applied to the skin. Lie down in a dark, quiet room when you have a headache. Keep lights dim if bright lights bother you or make your headaches worse. If directed, put ice on your head and neck area: Put ice in a plastic bag. Place a towel between your skin and the bag. Leave the ice on for 20 minutes, 2-3 times per day. Remove the ice if your skin turns bright red. This is very important. If you cannot feel pain, heat, or cold, you have a greater risk of damage to the area. If directed, apply heat to the affected area. Use the heat source that your health care provider recommends, such as a moist heat pack or a heating pad. Place a towel between your skin and the heat source. Leave the heat on for 20-30 minutes. Remove the heat if your skin turns bright red. This is especially important if you are unable to feel pain, heat, or cold. You have a greater risk of getting burned. Eating and drinking Eat meals on a regular schedule. If you drink alcohol: Limit how much you have to: 0-1 drink a day for women who are not pregnant. 0-2 drinks a day for men. Know how much alcohol is in a drink. In the [place] one drink equals one 12 oz bottle of beer (355 mL), one 5 oz glass of wine (148 mL), or one 1 oz glass of hard liquor (44 mL). Stop drinking  caffeine, or decrease the amount of caffeine you drink. Drink enough fluid to keep your urine pale yellow. General instructions  Keep a headache journal to help find out what may trigger your headaches. For example, write down: What you eat and drink. How much sleep you get. Any change to your diet or medicines. Try massage or other relaxation techniques. Limit stress. Sit up straight, and do not tense your muscles. Do not use any products that contain nicotine or tobacco. These products include cigarettes, chewing tobacco, and vaping devices, such as e-cigarettes. If you need help quitting, ask your health care provider. Exercise regularly as told by your health care provider. Sleep on a regular schedule. Get 7-9 hours of sleep each night, or the amount recommended by your health care provider. Keep all follow-up visits. This is important. Contact a health care provider if: Medicine does not help your symptoms. You have a headache that is different from your usual headache. You have nausea or you vomit. You have a fever. Get help right away if: Your headache: Becomes severe quickly. Gets worse after moderate to intense physical activity. You have any of these symptoms: Repeated vomiting. Pain or stiffness in your neck. Changes to your vision. Pain in an eye or ear. Problems with speech. Muscular weakness or loss of muscle control. Loss of balance or coordination. You feel faint or pass out. You have confusion. You have a seizure.  These symptoms may represent a serious problem that is an emergency. Do not wait to see if the symptoms will go away. Get medical help right away. Call your local emergency services (911 in the [place] Do not drive yourself to the hospital. Summary A headache is pain or discomfort felt around the head or neck area. There are many causes and types of headaches. In some cases, the cause may not be found. Keep a headache journal to help find out what may  trigger your headaches. Watch your condition for any changes. Let your health care provider know about them. Contact a health care provider if you have a headache that is different from the usual headache, or if your symptoms are not helped by medicine. Get help right away if your headache becomes severe, you vomit, you have a loss of vision, you lose your balance, or you have a seizure. This information is not intended to replace advice given to you by your health care provider. Make sure you discuss any questions you have with your health care provider. Document Revised: 05/17/2021 Document Reviewed: 05/17/2021 Elsevier Patient Education  [place].

## 2022-02-20 NOTE — Progress Notes (Signed)
Pt here for patient teaching.    Pt given Radiation and You booklet and skin care instructions.    Reviewed areas of pertinence such as diarrhea, fatigue, hair loss, nausea and vomiting, sexual and fertility changes, skin changes, and urinary and bladder changes .   Pt able to give teach back of to pat skin, use unscented/gentle soap, use baby wipes, have Imodium on hand, and drink plenty of water,avoid applying anything to skin within 4 hours of treatment.   Pt verbalizes understanding of information given and will contact nursing with any questions or concerns.

## 2022-02-20 NOTE — Progress Notes (Signed)
Subjective:  Patient ID: Tina Stone, female    DOB: 06/24/1950  Age: 72 y.o. MRN: [id number]  CC: Headache  This visit occurred during the SARS-CoV-2 public health emergency.  Safety protocols were in place, including screening questions prior to the visit, additional usage of staff PPE, and extensive cleaning of exam room while observing appropriate contact time as indicated for disinfecting solutions.    HPI Tina Stone presents for f/up -  She complains of a 3-week history of intermittent frontal headache.  She describes it as a throbbing sensation and feeling off balance.  She denies blurred vision, slurred speech, or paresthesias.  Outpatient Medications Prior to Visit  Medication Sig Dispense Refill   acetaminophen (TYLENOL) 500 MG tablet Take 1,000 mg by mouth every 6 (six) hours as needed for moderate pain.     apixaban (ELIQUIS) 5 MG TABS tablet Take 1 tablet (5 mg total) by mouth 2 (two) times daily. 180 tablet 1   APPLE CIDER VINEGAR PO Take 5 mLs by mouth daily.      atenolol (TENORMIN) 25 MG tablet TAKE 1 TABLET (25 MG TOTAL) BY MOUTH DAILY. 90 tablet 1   Cholecalciferol (VITAMIN D3) 50 MCG (2000 UT) TABS Take by mouth.     levocetirizine (XYZAL) 5 MG tablet Can use up to 2 tablets in the morning and 2 tablets at night for hives/itching. This is maximum dose. Titrate as needed. 120 tablet 3   levothyroxine (SYNTHROID) 100 MCG tablet Take 100 mcg by mouth daily before breakfast.     Multiple Vitamin (MULTIVITAMIN) capsule Take 1 capsule by mouth daily.     polyethylene glycol (MIRALAX / GLYCOLAX) 17 g packet Take 17 g by mouth daily as needed for mild constipation.     triamcinolone cream (KENALOG) 0.1 % Apply 1 application topically 2 (two) times daily. 30 g 1   cetirizine (ZYRTEC ALLERGY) 10 MG tablet Take 1 tablet twice daily.  If hives not controlled, increase to 2 tablets twice daily.  Do this until hives free x 3 weeks, then can start decreasing 1 tablet  every 3-5 days until off. (Patient not taking: Reported on 02/07/2022) 120 tablet 4   EPINEPHrine (EPIPEN 2-PAK) 0.3 mg/0.3 mL IJ SOAJ injection Inject 0.3 mg into the muscle as needed for anaphylaxis. (Patient not taking: Reported on 02/07/2022) 1 each 2   fluticasone (FLONASE) 50 MCG/ACT nasal spray Place 2 sprays into both nostrils daily. (Patient not taking: Reported on 02/20/2022) 16 g 6   No facility-administered medications prior to visit.    ROS Review of Systems  Constitutional: Negative.  Negative for chills and fever.  HENT:  Negative for trouble swallowing.   Eyes: Negative.  Negative for visual disturbance.  Respiratory:  Negative for cough and shortness of breath.   Cardiovascular:  Negative for chest pain, palpitations and leg swelling.  Gastrointestinal:  Negative for abdominal pain, diarrhea, nausea and vomiting.  Endocrine: Negative.   Genitourinary: Negative.   Musculoskeletal:  Positive for gait problem.  Skin: Negative.   Neurological:  Positive for headaches. Negative for dizziness, weakness and light-headedness.  Hematological:  Negative for adenopathy. Does not bruise/bleed easily.  Psychiatric/Behavioral: Negative.     Objective:  BP 118/76 (BP Location: Right Arm, Patient Position: Sitting, Cuff Size: Large)    Pulse 70    Temp 98 F (36.7 C) (Oral)    Ht 5\' 4"  (1.626 m)    Wt 203 lb (92.1 kg)    SpO2 100%  BMI 34.84 kg/m   BP Readings from Last 3 Encounters:  02/20/22 118/76  02/16/22 125/81  02/07/22 138/79    Wt Readings from Last 3 Encounters:  02/20/22 203 lb (92.1 kg)  02/07/22 201 lb 8 oz (91.4 kg)  02/05/22 199 lb 12.8 oz (90.6 kg)    Physical Exam Vitals reviewed.  Constitutional:      Appearance: She is not ill-appearing.  HENT:     Nose: Nose normal.     Mouth/Throat:     Mouth: Mucous membranes are moist.  Eyes:     General: No scleral icterus.    Extraocular Movements: Extraocular movements intact.     Right eye: No nystagmus.      Left eye: No nystagmus.     Pupils: Pupils are equal, round, and reactive to light.  Cardiovascular:     Rate and Rhythm: Normal rate and regular rhythm.     Heart sounds: No murmur heard. Pulmonary:     Effort: Pulmonary effort is normal.     Breath sounds: No stridor. No wheezing, rhonchi or rales.  Abdominal:     General: Abdomen is flat.     Palpations: There is no mass.     Tenderness: There is no abdominal tenderness. There is no guarding.     Hernia: No hernia is present.  Musculoskeletal:        General: Normal range of motion.     Cervical back: Neck supple.     Right lower leg: No edema.     Left lower leg: No edema.  Lymphadenopathy:     Cervical: No cervical adenopathy.  Skin:    General: Skin is warm and dry.  Neurological:     Mental Status: She is alert.     Cranial Nerves: Cranial nerves 2-12 are intact.     Sensory: Sensation is intact.     Motor: No weakness.     Coordination: Romberg sign positive. Coordination normal. Finger-Nose-Finger Test and Heel to [facility] Test normal.     Gait: Gait is intact.     Deep Tendon Reflexes: Reflexes normal. Babinski sign absent on the right side. Babinski sign absent on the left side.     Reflex Scores:      Tricep reflexes are 0 on the right side and 0 on the left side.      Bicep reflexes are 0 on the right side and 0 on the left side.      Brachioradialis reflexes are 0 on the right side and 0 on the left side.      Patellar reflexes are 0 on the right side and 0 on the left side.      Achilles reflexes are 0 on the right side and 0 on the left side.   Lab Results  Component Value Date   WBC 3.7 (L) 02/02/2022   HGB 11.7 (L) 02/02/2022   HCT 37.0 02/02/2022   PLT 202 02/02/2022   GLUCOSE 99 02/02/2022   CHOL 234 (H) 02/02/2022   TRIG 84.0 02/02/2022   HDL 72.60 02/02/2022   LDLCALC 145 (H) 02/02/2022   ALT 10 02/02/2022   AST 16 02/02/2022   NA 138 02/02/2022   K 4.1 02/02/2022   CL 103 02/02/2022    CREATININE 0.82 02/02/2022   BUN 13 02/02/2022   CO2 27 02/02/2022   TSH 0.05 (L) 02/02/2022   INR 1.0 01/28/2021   HGBA1C 5.3 02/02/2022    No results found.  Assessment &  Plan:   Alizah was seen today for headache.  Diagnoses and all orders for this visit:  New daily persistent headache- I think an MRI is in order to check for metastatic disease, tumor, bleed, or NPH. -     MR Brain W Wo Contrast; Future  Endometrial cancer ([place]) -     MR Brain W Wo Contrast; Future   I am having Caren Griffins E. Hollister maintain her APPLE CIDER VINEGAR PO, multivitamin, acetaminophen, polyethylene glycol, Vitamin D3, apixaban, triamcinolone cream, EPINEPHrine, levothyroxine, cetirizine, levocetirizine, fluticasone, and atenolol.  No orders of the defined types were placed in this encounter.    Follow-up: Return in about 4 weeks (around 03/20/2022).  Scarlette Calico, MD

## 2022-02-21 ENCOUNTER — Encounter: Payer: Self-pay | Admitting: Hematology and Oncology

## 2022-02-21 ENCOUNTER — Other Ambulatory Visit: Payer: Self-pay

## 2022-02-21 ENCOUNTER — Ambulatory Visit
Admission: RE | Admit: 2022-02-21 | Discharge: 2022-02-21 | Disposition: A | Discharge status: Home / Self Care | Payer: Medicare Other | Source: Ambulatory Visit | Attending: Radiation Oncology | Admitting: Radiation Oncology

## 2022-02-21 DIAGNOSIS — C787 Secondary malignant neoplasm of liver and intrahepatic bile duct: Secondary | ICD-10-CM | POA: Diagnosis not present

## 2022-02-21 DIAGNOSIS — Z510 Encounter for antineoplastic radiation therapy: Secondary | ICD-10-CM | POA: Diagnosis not present

## 2022-02-21 DIAGNOSIS — C775 Secondary and unspecified malignant neoplasm of intrapelvic lymph nodes: Secondary | ICD-10-CM | POA: Diagnosis not present

## 2022-02-22 ENCOUNTER — Ambulatory Visit
Admission: RE | Admit: 2022-02-22 | Discharge: 2022-02-22 | Disposition: A | Discharge status: Home / Self Care | Payer: Medicare Other | Source: Ambulatory Visit | Attending: Radiation Oncology | Admitting: Radiation Oncology

## 2022-02-22 DIAGNOSIS — Z510 Encounter for antineoplastic radiation therapy: Secondary | ICD-10-CM | POA: Diagnosis not present

## 2022-02-22 DIAGNOSIS — C775 Secondary and unspecified malignant neoplasm of intrapelvic lymph nodes: Secondary | ICD-10-CM | POA: Diagnosis not present

## 2022-02-22 DIAGNOSIS — C787 Secondary malignant neoplasm of liver and intrahepatic bile duct: Secondary | ICD-10-CM | POA: Diagnosis not present

## 2022-02-23 ENCOUNTER — Ambulatory Visit
Admission: RE | Admit: 2022-02-23 | Discharge: 2022-02-23 | Disposition: A | Discharge status: Home / Self Care | Payer: Medicare Other | Source: Ambulatory Visit | Attending: Radiation Oncology | Admitting: Radiation Oncology

## 2022-02-23 ENCOUNTER — Other Ambulatory Visit: Payer: Self-pay

## 2022-02-23 DIAGNOSIS — C787 Secondary malignant neoplasm of liver and intrahepatic bile duct: Secondary | ICD-10-CM | POA: Diagnosis not present

## 2022-02-23 DIAGNOSIS — C775 Secondary and unspecified malignant neoplasm of intrapelvic lymph nodes: Secondary | ICD-10-CM | POA: Diagnosis not present

## 2022-02-23 DIAGNOSIS — Z510 Encounter for antineoplastic radiation therapy: Secondary | ICD-10-CM | POA: Diagnosis not present

## 2022-02-26 ENCOUNTER — Other Ambulatory Visit: Payer: Self-pay

## 2022-02-26 ENCOUNTER — Ambulatory Visit
Admission: RE | Admit: 2022-02-26 | Discharge: 2022-02-26 | Disposition: A | Discharge status: Home / Self Care | Payer: Medicare Other | Source: Ambulatory Visit | Attending: Radiation Oncology | Admitting: Radiation Oncology

## 2022-02-26 DIAGNOSIS — Z510 Encounter for antineoplastic radiation therapy: Secondary | ICD-10-CM | POA: Diagnosis not present

## 2022-02-26 DIAGNOSIS — C787 Secondary malignant neoplasm of liver and intrahepatic bile duct: Secondary | ICD-10-CM | POA: Diagnosis not present

## 2022-02-26 DIAGNOSIS — C775 Secondary and unspecified malignant neoplasm of intrapelvic lymph nodes: Secondary | ICD-10-CM | POA: Diagnosis not present

## 2022-02-27 ENCOUNTER — Ambulatory Visit
Admission: RE | Admit: 2022-02-27 | Discharge: 2022-02-27 | Disposition: A | Discharge status: Home / Self Care | Payer: Medicare Other | Source: Ambulatory Visit | Attending: Radiation Oncology | Admitting: Radiation Oncology

## 2022-02-27 DIAGNOSIS — Z510 Encounter for antineoplastic radiation therapy: Secondary | ICD-10-CM | POA: Diagnosis not present

## 2022-02-27 DIAGNOSIS — C787 Secondary malignant neoplasm of liver and intrahepatic bile duct: Secondary | ICD-10-CM | POA: Diagnosis not present

## 2022-02-27 DIAGNOSIS — C775 Secondary and unspecified malignant neoplasm of intrapelvic lymph nodes: Secondary | ICD-10-CM | POA: Diagnosis not present

## 2022-02-27 NOTE — Progress Notes (Signed)
FOLLOW UP Date of Service/Encounter:  02/28/22   Subjective:  Tina Stone (DOB: May 05, 1950) is a 72 y.o. female with PMHx of endometrial cancer who returns to the [facility] on 02/28/2022 in re-evaluation of the following: chronic hives and food allergy (sesame, tree nuts -except pistacio/cashew, and shellfish) History obtained from: chart review and patient.  For Review, LV was on 11/22/21  with Dr.Cline Draheim.  Hives were controlled on Xyzal 5 mg BID.  Thyroid disease was not controlled, and she was making adjustments with treating physician for this purpose. Last TSH on 02/02/22 remained uncontrolled at 0.05. Additionally since last visit, she has been found to have an enlarging right iliac lymph node suspicious for metastatic disease form endometrial cancer and is now undergoing radiation therapy.  Today presents for follow-up. She is much better, but still having occasional tingling.  Comes and goes very quickly.  Mostly on her arms and face.  She continues to take Xyzal twice daily.  She is wondering if she could discontinue or try taking less of this medication.  She has not had hives in many months. She continues to have some issues with her thyroid.  She was also recently diagnosed with metastatic endometrial cancer and is receiving radiation  She continues to avoid shellfish and almond due to past history of reaction and positive testing.  Also avoiding sesame because of positive testing though she has never tried this food.  She is not avoiding mushrooms or any of the other foods which were positive on previous testing and is tolerating them without any adverse events.   Previous Diagnostics: Allergic rhinitis:  - total IgE 742 - respiratory panel 08/2021: positives pigweed, ragweed, Johnson, Timothy, [place], pecan/hickory tree, elm, cedar, box elder, cockroach, dog, cat, dust mites -10/20/2021: SPT environmentals-positive to grasses, mugwort, trees Concern for  food allergy:   - hx of reaction: tree nuts and shellfish- upset stomach, nausea on multiple occasions with these foods within 15 minutes to an hour, has avoided since 2000 - food panel 08/2021: positive to peanut (1.98), wheat (0.98), walnut (1.08), soy (0.39), sesame (0.62), hazelnut (0.36), almonds (0.62). -10/20/2021: SPT to select food allergens (peanut, soy, wheat, sesame, milk, egg, casting, shellfish, fish, cashew and mushrooms) negative - eating wheat, soy, peanut, cashews and pistachios. - avoiding sesame, shellfish, other tree nuts except cashews and pistachios Chronic hives:  - Hives labs: CBCd (chronic anemia), tryptase (wnl-5), alpha-gal (negative), total IgE 830 - TSH 11/04-low <0.01, T4 wnl (1.25)  Allergies as of 02/28/2022       Reactions   Benicar Hct [olmesartan Medoxomil-hctz] Shortness Of Breath, Palpitations   Iodinated Contrast Media Hives, Itching   02/02/2021-  pt developed 2 hives and itching even with the 13hr premedication.  Radiology PA came into eval the pt. Developed itching and hives after injection on 05/10/20; needs 13hr prep in future   Bactrim [sulfamethoxazole-trimethoprim] Other (See Comments)   Abdominal pain, dizziness   Pravastatin Other (See Comments)   Lower abdominal pain   Shellfish Allergy Nausea And Vomiting   Amlodipine Palpitations        Medication List        Accurate as of February 28, 2022  1:24 PM. If you have any questions, ask your nurse or doctor.          STOP taking these medications    fluticasone 50 MCG/ACT nasal spray Commonly known as: FLONASE Stopped by: Sigurd Sos, MD       TAKE these  medications    acetaminophen 500 MG tablet Commonly known as: TYLENOL Take 1,000 mg by mouth every 6 (six) hours as needed for moderate pain.   apixaban 5 MG Tabs tablet Commonly known as: Eliquis Take 1 tablet (5 mg total) by mouth 2 (two) times daily.   APPLE CIDER VINEGAR PO Take 5 mLs by mouth daily.   atenolol 25 MG  tablet Commonly known as: TENORMIN TAKE 1 TABLET (25 MG TOTAL) BY MOUTH DAILY.   cetirizine 10 MG tablet Commonly known as: ZyrTEC Allergy Take 1 tablet twice daily.  If hives not controlled, increase to 2 tablets twice daily.  Do this until hives free x 3 weeks, then can start decreasing 1 tablet every 3-5 days until off.   EPINEPHrine 0.3 mg/0.3 mL Soaj injection Commonly known as: EpiPen 2-Pak Inject 0.3 mg into the muscle as needed for anaphylaxis.   levocetirizine 5 MG tablet Commonly known as: XYZAL Can use up to 2 tablets in the morning and 2 tablets at night for hives/itching. This is maximum dose. Titrate as needed.   levothyroxine 100 MCG tablet Commonly known as: SYNTHROID Take 100 mcg by mouth daily before breakfast.   multivitamin capsule Take 1 capsule by mouth daily.   polyethylene glycol 17 g packet Commonly known as: MIRALAX / GLYCOLAX Take 17 g by mouth daily as needed for mild constipation.   triamcinolone cream 0.1 % Commonly known as: KENALOG Apply 1 application topically 2 (two) times daily.   Vitamin D3 50 MCG (2000 UT) Tabs Take by mouth.       Past Medical History:  Diagnosis Date   Adenomatous colon polyp    Allergic rhinitis, seasonal    Allergy    Beta thalassemia trait 11/25/2013   Cholelithiasis    Class 3 obesity without serious comorbidity with body mass index (BMI) of 40.0 to 44.9 in adult 11/19/2012   endometrial ca dx'd 08/2009   endometrial    GERD (gastroesophageal reflux disease)    HLD (hyperlipidemia)    Hypercholesterolemia    Hypertension 03/18/2017   no meds    Intraductal papilloma of left breast    Patient underwent left needle-localized lumpectomy by Dr. Imogene Burn. Tsuei on 09/09/2013; pathology showed intraductal papilloma with no atypia or malignancy identified.   PONV (postoperative nausea and vomiting)    Pre-diabetes    pt denies   Urticaria    Uterine fibroid    Past Surgical History:  Procedure Laterality  Date   ANKLE FRACTURE SURGERY  1966   right   BREAST EXCISIONAL BIOPSY Left 2014   benign   BREAST LUMPECTOMY WITH NEEDLE LOCALIZATION Left 09/09/2013   Procedure: BREAST LUMPECTOMY WITH NEEDLE LOCALIZATION;  Surgeon: Imogene Burn. Georgette Dover, MD;  Location: [facility];  Service: General;  Laterality: Left;   CHOLECYSTECTOMY     COLONOSCOPY     IR IMAGING GUIDED PORT INSERTION  05/18/2020   POLYPECTOMY     ROBOTIC ASSISTED LAPAROSCOPIC CHOLECYSTECTOMY  09/09/2019   ROBOTIC ASSISTED TOTAL HYSTERECTOMY WITH BILATERAL SALPINGO OOPHERECTOMY N/A 10/22/2019   Procedure: XI ROBOTIC ASSISTED TOTAL HYSTERECTOMY WITH BILATERAL SALPINGO OOPHORECTOMY GREATER THAN 250 GRAMS, MINI LAPARTOMY FOR SPECIMEN DELIVERY; PELVIC AND PERI-AORTIC LYMPHADENECTOMY;  Surgeon: Everitt Amber, MD;  Location: [facility];  Service: Gynecology;  Laterality: N/A;   SENTINEL NODE BIOPSY N/A 10/22/2019   Procedure: SENTINEL NODE BIOPSY;  Surgeon: Everitt Amber, MD;  Location: [facility];  Service: Gynecology;  Laterality: N/A;   Otherwise, there have been no changes to  her past medical history, surgical history, family history, or social history.  ROS: All others negative except as noted per HPI.   Objective:  BP 122/70    Pulse 60    Temp 98 F (36.7 C)    Resp 16    Ht 5\' 4"  (1.626 m)    Wt 206 lb 6 oz (93.6 kg)    SpO2 100%    BMI 35.42 kg/m  Body mass index is 35.42 kg/m. Physical Exam: General Appearance:  Alert, cooperative, no distress, appears stated age  Head:  Normocephalic, without obvious abnormality, atraumatic  Eyes:  Conjunctiva clear, EOM's intact  Nose: Nares normal, hypertrophic turbinates, normal mucosa, no visible anterior polyps, and septum midline  Throat: Lips, tongue normal; teeth and gums normal, normal posterior oropharynx  Neck: Supple, symmetrical  Lungs:   clear to auscultation bilaterally, Respirations unlabored, no coughing  Heart:  regular rate and rhythm and no murmur, Appears well perfused  Extremities: No  edema  Skin: Skin color, texture, turgor normal, no rashes or lesions on visualized portions of skin  Neurologic: No gross deficits   Assessment/Plan  Hives have been well controlled, we discussed a plan to try titrating off antihistamines.   Regarding food allergies, have advised ongoing avoidance at this point.  We can discuss retesting for shellfish and tree nuts in the future.  She is not interested in oral challenge for these at this moment.  Chronic idiopathic Urticaria (hives lasting more than 6 weeks, unknown trigger): - skin testing  showed positive to grasses, weed (mugwor), trees (elm, oak, walnut) - previous hives testing was reassuring - she has known hypothyroidism which has been recently uncontrolled  and metastatic endometrial cancer receiving radiation therapy  Therapy Plan:  - continue Xyzal (levocetirizine) 5 mg twice daily - if hives remain uncontrolled, increase dose of Xyzal (levocetirizine) 5 mg to max dose of 10mg  (2 pills) twice daily- this is maximum dose - can increase or decrease dosing depending on symptom control to a maximum dose of 4 tablets of antihistamine daily. Wait until hives free for at least one month prior to decreasing dose.   - if hives are still uncontrolled with the above regimen, please arrange an appointment for discussion of Xolair (omalizumab)- an injectable medication for hives  Can use one of the following in place of Xyzal if desires: Zyrtec (cetirizine) 10 mg, Claritin (loratadine) 10 mg, Xyzal (levocetirizine) 5 mg or Allegra (fexofenadine) 180 mg daily as needed  Food allergy:  - please strictly avoid shellfish, tree nuts (except cashews and pistachios which you already eat and tolerate), sesame - for SKIN only reaction, take zyrtec as described above - for SKIN + ANY additional symptoms, OR IF concern for LIFE THREATENING reaction = Epipen Autoinjector EpiPen 0.3 mg. - If using Epinephrine autoinjector, call 911 - A food allergy  action plan has been provided and discussed. - Medic Alert identification is recommended.   Follow-up in 6 months, sooner if needed.   Sigurd Sos, MD  [facility]

## 2022-02-28 ENCOUNTER — Encounter: Payer: Self-pay | Admitting: Internal Medicine

## 2022-02-28 ENCOUNTER — Ambulatory Visit
Admission: RE | Admit: 2022-02-28 | Discharge: 2022-02-28 | Disposition: A | Discharge status: Home / Self Care | Payer: Medicare Other | Source: Ambulatory Visit | Attending: Radiation Oncology | Admitting: Radiation Oncology

## 2022-02-28 ENCOUNTER — Other Ambulatory Visit: Payer: Self-pay

## 2022-02-28 ENCOUNTER — Other Ambulatory Visit: Payer: Self-pay | Admitting: Radiation Oncology

## 2022-02-28 ENCOUNTER — Ambulatory Visit (INDEPENDENT_AMBULATORY_CARE_PROVIDER_SITE_OTHER): Payer: Medicare Other | Admitting: Internal Medicine

## 2022-02-28 VITALS — BP 122/70 | HR 60 | Temp 98.0°F | Resp 16 | Ht 64.0 in | Wt 206.4 lb

## 2022-02-28 DIAGNOSIS — H1010 Acute atopic conjunctivitis, unspecified eye: Secondary | ICD-10-CM

## 2022-02-28 DIAGNOSIS — C541 Malignant neoplasm of endometrium: Secondary | ICD-10-CM | POA: Diagnosis not present

## 2022-02-28 DIAGNOSIS — C787 Secondary malignant neoplasm of liver and intrahepatic bile duct: Secondary | ICD-10-CM | POA: Diagnosis not present

## 2022-02-28 DIAGNOSIS — L501 Idiopathic urticaria: Secondary | ICD-10-CM

## 2022-02-28 DIAGNOSIS — Z510 Encounter for antineoplastic radiation therapy: Secondary | ICD-10-CM | POA: Diagnosis not present

## 2022-02-28 DIAGNOSIS — C775 Secondary and unspecified malignant neoplasm of intrapelvic lymph nodes: Secondary | ICD-10-CM | POA: Insufficient documentation

## 2022-02-28 DIAGNOSIS — J309 Allergic rhinitis, unspecified: Secondary | ICD-10-CM

## 2022-02-28 DIAGNOSIS — Z91018 Allergy to other foods: Secondary | ICD-10-CM

## 2022-02-28 DIAGNOSIS — H1013 Acute atopic conjunctivitis, bilateral: Secondary | ICD-10-CM

## 2022-02-28 MED ORDER — LEVOCETIRIZINE DIHYDROCHLORIDE 5 MG PO TABS: ORAL_TABLET | ORAL | 3 refills | Status: DC

## 2022-02-28 MED ORDER — ONDANSETRON HCL 4 MG PO TABS: 4.0000 mg | ORAL_TABLET | Freq: Three times a day (TID) | ORAL | 0 refills | Status: DC | PRN

## 2022-02-28 NOTE — Patient Instructions (Addendum)
Chronic idiopathic Urticaria (hives lasting more than 6 weeks, unknown trigger): ?- skin testing  showed positive to grasses, weed (mugwor), trees (elm, oak, walnut) ?- previous hives testing was reassuring ?- she has known hypothyroidism ? ?Therapy Plan:  ?- continue Xyzal (levocetirizine) 5 mg twice daily ?- if hives remain uncontrolled, increase dose of Xyzal (levocetirizine) 5 mg to max dose of 10mg  (2 pills) twice daily- this is maximum dose ?- can increase or decrease dosing depending on symptom control to a maximum dose of 4 tablets of antihistamine daily. Wait until hives free for at least one month prior to decreasing dose.   ?- if hives are still uncontrolled with the above regimen, please arrange an appointment for discussion of Xolair (omalizumab)- an injectable medication for hives ? ?Can use one of the following in place of Xyzal if desires: Zyrtec (cetirizine) 10 mg, Claritin (loratadine) 10 mg, Xyzal (levocetirizine) 5 mg or Allegra (fexofenadine) 180 mg daily as needed ? ?Food allergy:  ?- please strictly avoid shellfish, tree nuts (except cashews and pistachios which you already eat and tolerate), sesame ?- for SKIN only reaction, take zyrtec as described above ?- for SKIN + ANY additional symptoms, OR IF concern for LIFE THREATENING reaction = Epipen Autoinjector EpiPen 0.3 mg. ?- If using Epinephrine autoinjector, call 911 ?- A food allergy action plan has been provided and discussed. ?- Medic Alert identification is recommended. ? ? ?Follow-up in 6 months, sooner if needed.  ?-------------------------------------------------------------------- ?Reducing Pollen Exposure ? ?The American Academy of Allergy, Asthma and Immunology suggests the following steps to reduce your exposure to pollen during allergy seasons. ?   ?Do not hang sheets or clothing out to dry; pollen may collect on these items. ?Do not mow lawns or spend time around freshly cut grass; mowing stirs up pollen. ?Keep windows closed  at night.  Keep car windows closed while driving. ?Minimize morning activities outdoors, a time when pollen counts are usually at their highest. ?Stay indoors as much as possible when pollen counts or humidity is high and on windy days when pollen tends to remain in the air longer. ?Use air conditioning when possible.  Many air conditioners have filters that trap the pollen spores. ?Use a HEPA room air filter to remove pollen form the indoor air you breathe. ? ?DUST MITE AVOIDANCE MEASURES: ? ?There are three main measures that need and can be taken to avoid house dust mites: ? ?Reduce accumulation of dust in general ?-reduce furniture, clothing, carpeting, books, stuffed animals, especially in bedroom ? ?Separate yourself from the dust ?-use pillow and mattress encasements (can be found at stores such as [employer] or online) ?-avoid direct exposure to air condition flow ?-use a HEPA filter device, especially in the bedroom; you can also use a HEPA filter vacuum cleaner ?-wipe dust with a moist towel instead of a dry towel or broom when cleaning ? ?Decrease mites and/or their secretions ?-wash clothing and linen and stuffed animals at highest temperature possible, at least every 2 weeks ?-stuffed animals can also be placed in a bag and put in a freezer overnight ? ?Despite the above measures, it is impossible to eliminate dust mites or their allergen completely from your home.  With the above measures the burden of mites in your home can be diminished, with the goal of minimizing your allergic symptoms.  Success will be reached only when implementing and using all means together. ? ? ?

## 2022-03-01 ENCOUNTER — Ambulatory Visit
Admission: RE | Admit: 2022-03-01 | Discharge: 2022-03-01 | Disposition: A | Discharge status: Home / Self Care | Payer: Medicare Other | Source: Ambulatory Visit | Attending: Radiation Oncology | Admitting: Radiation Oncology

## 2022-03-01 DIAGNOSIS — C787 Secondary malignant neoplasm of liver and intrahepatic bile duct: Secondary | ICD-10-CM | POA: Diagnosis not present

## 2022-03-01 DIAGNOSIS — Z510 Encounter for antineoplastic radiation therapy: Secondary | ICD-10-CM | POA: Diagnosis not present

## 2022-03-01 DIAGNOSIS — C775 Secondary and unspecified malignant neoplasm of intrapelvic lymph nodes: Secondary | ICD-10-CM | POA: Diagnosis not present

## 2022-03-02 ENCOUNTER — Ambulatory Visit
Admission: RE | Admit: 2022-03-02 | Discharge: 2022-03-02 | Disposition: A | Discharge status: Home / Self Care | Payer: Medicare Other | Source: Ambulatory Visit | Attending: Radiation Oncology | Admitting: Radiation Oncology

## 2022-03-02 ENCOUNTER — Other Ambulatory Visit: Payer: Self-pay

## 2022-03-02 DIAGNOSIS — Z510 Encounter for antineoplastic radiation therapy: Secondary | ICD-10-CM | POA: Diagnosis not present

## 2022-03-02 DIAGNOSIS — C775 Secondary and unspecified malignant neoplasm of intrapelvic lymph nodes: Secondary | ICD-10-CM | POA: Diagnosis not present

## 2022-03-02 DIAGNOSIS — C787 Secondary malignant neoplasm of liver and intrahepatic bile duct: Secondary | ICD-10-CM | POA: Diagnosis not present

## 2022-03-03 IMAGING — CT CT ABD-PELV W/O CM
2 of 4 series · 17 of 46 positions shown, 19 images · non-contrast
Comparison: 05/17/2021

CLINICAL DATA: Follow-up endometrial carcinoma. Previous
chemotherapy. Surveillance

EXAM:
CT ABDOMEN AND PELVIS WITHOUT CONTRAST
TECHNIQUE: Multidetector CT imaging of the abdomen and pelvis was performed
following the standard protocol without IV contrast.

[Series 2: axial st · axial · 0.65mm/px · z∈[+1008,+1388]mm · 14 of 86 slices shown, 16 images]
[im 5/86  soft-tissue]
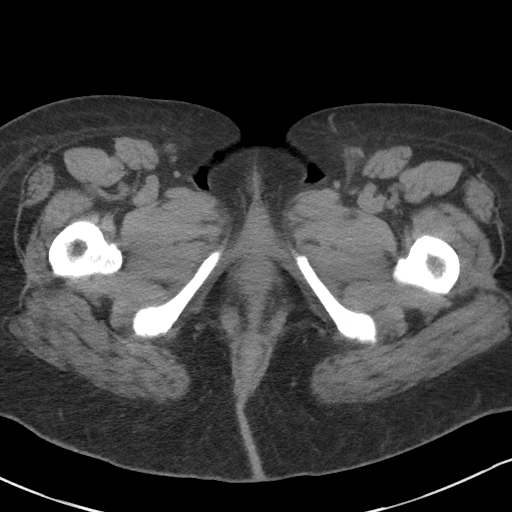
[im 5/86  bone]
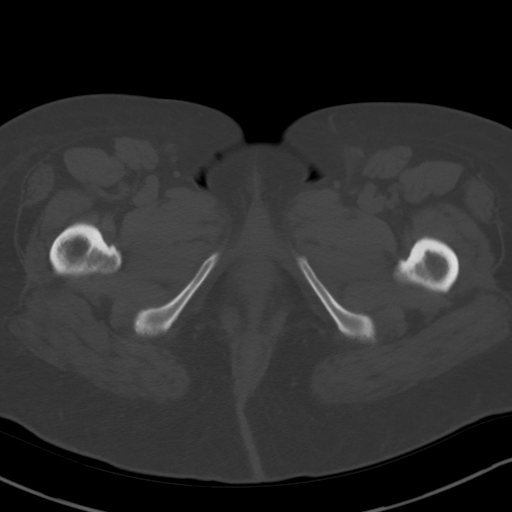
[im 10/86  soft-tissue]
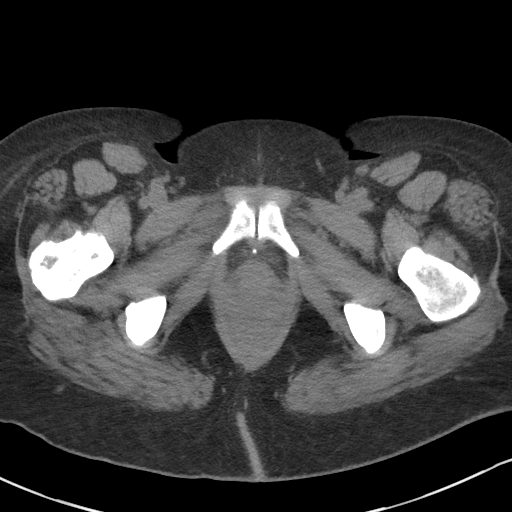
[im 19/86  soft-tissue]
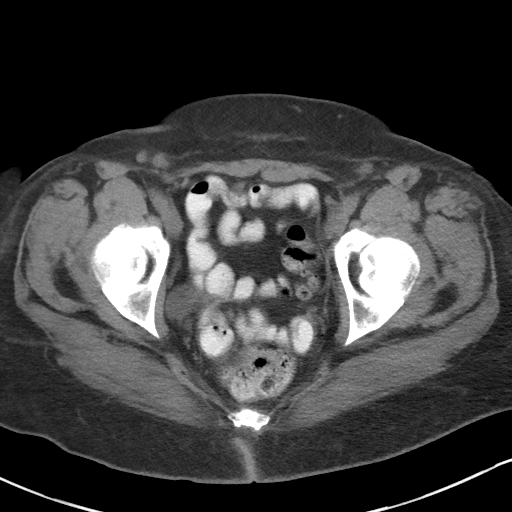
[im 24/86  soft-tissue]
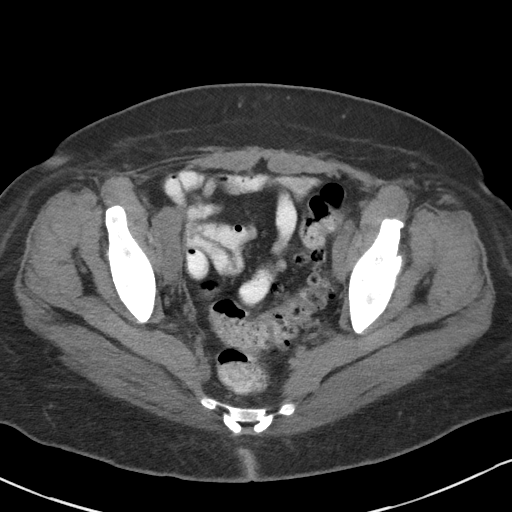
[im 29/86  soft-tissue]
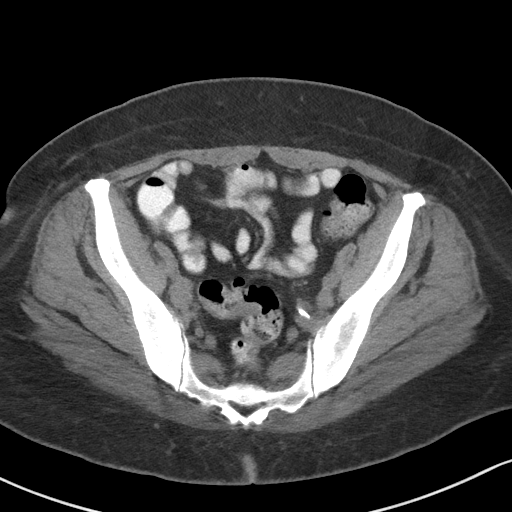
[im 34/86  soft-tissue]
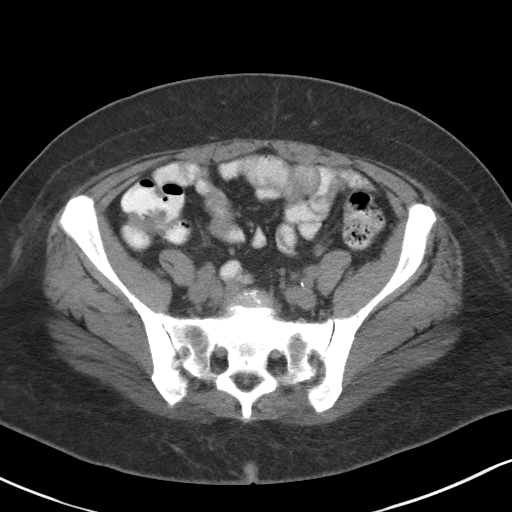
[im 38/86  soft-tissue]
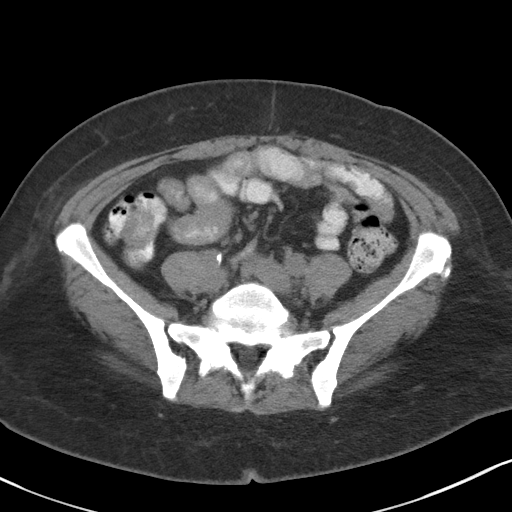
[im 48/86  soft-tissue]
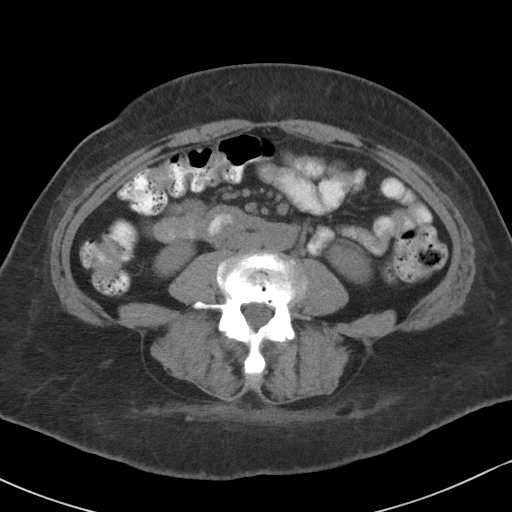
[im 52/86  soft-tissue]
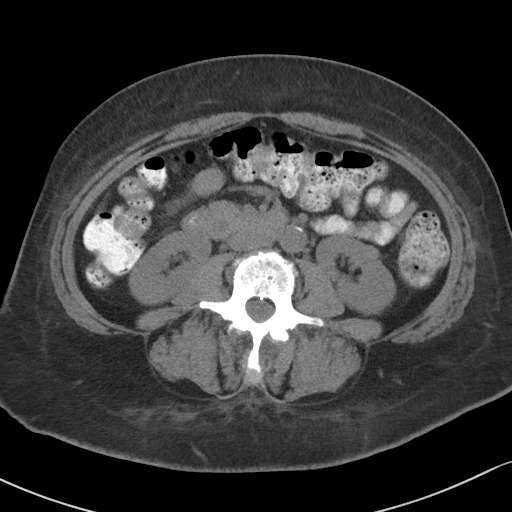
[im 52/86  bone]
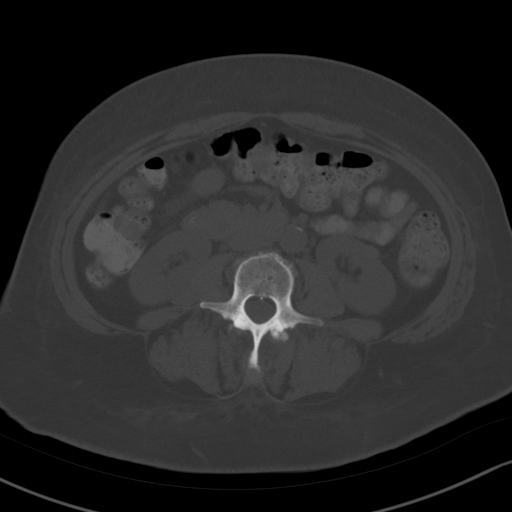
[im 57/86  soft-tissue]
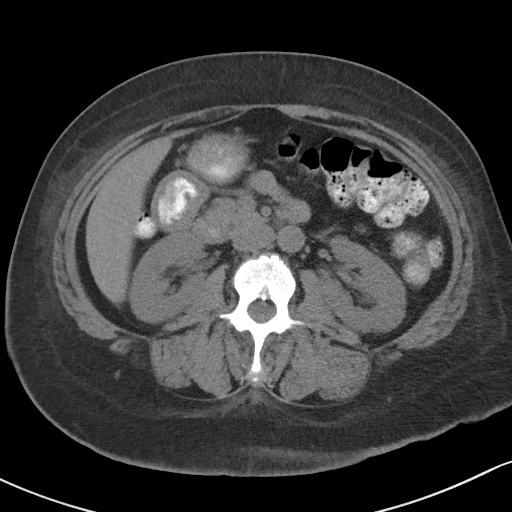
[im 62/86  soft-tissue]
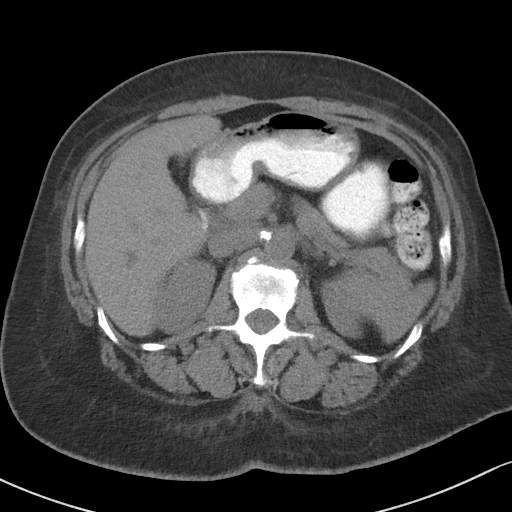
[im 67/86  soft-tissue]
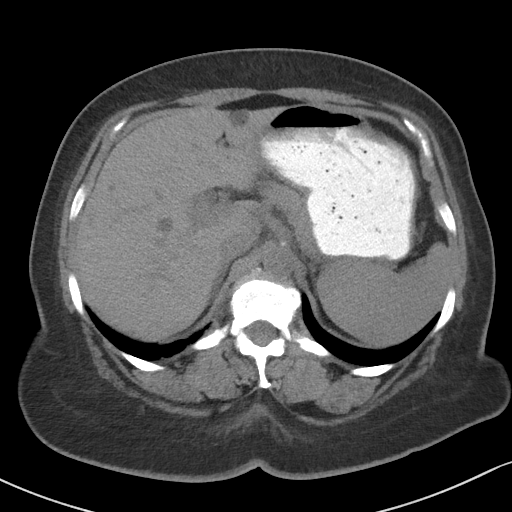
[im 76/86  soft-tissue]
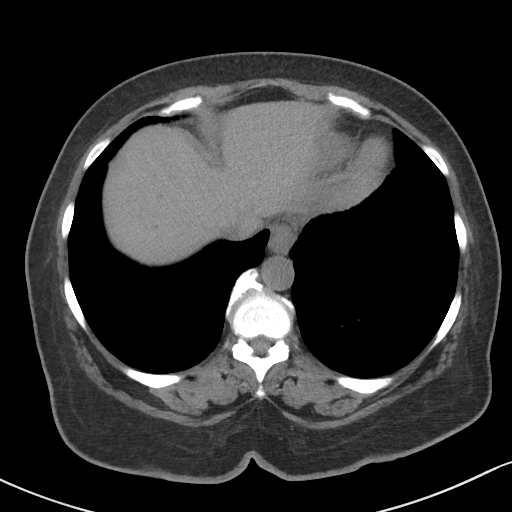
[im 81/86  soft-tissue]
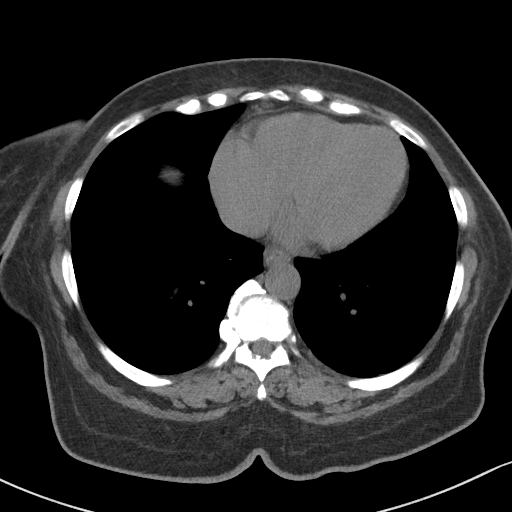

[Series 4: coronal st · coronal · 0.83mm/px · 3 of 97 slices shown]
[im 33/97  soft-tissue]
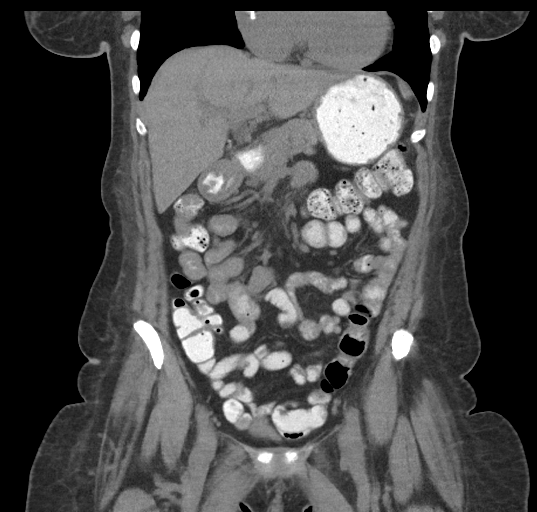
[im 43/97  soft-tissue]
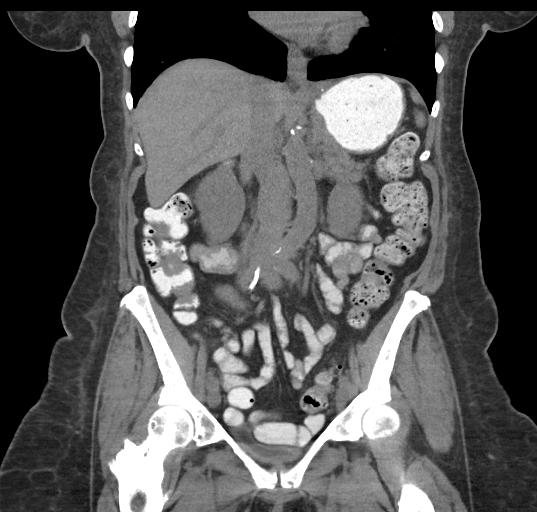
[im 54/97  soft-tissue]
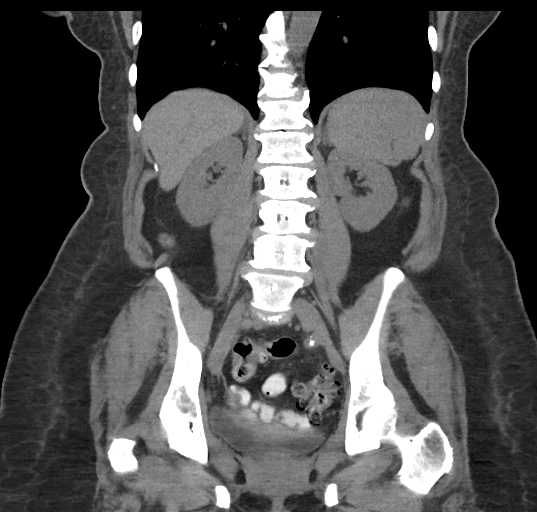

[17 of 46 positions shown; findings below may reference images not displayed]

FINDINGS: Lower chest: No acute findings.

Hepatobiliary: Multiple small fluid attenuation hepatic cysts remain
stable. No mass visualized on this unenhanced exam. Prior
cholecystectomy. No evidence of biliary obstruction.

Pancreas: No mass or inflammatory process visualized on this
unenhanced exam.

Spleen:  Within normal limits in size.

Adrenals/Urinary tract: No evidence of urolithiasis or
hydronephrosis. Unremarkable unopacified urinary bladder.

Stomach/Bowel: No evidence of obstruction, inflammatory process, or
abnormal fluid collections. Diverticulosis is seen mainly involving
the sigmoid colon, however there is no evidence of diverticulitis.

Vascular/Lymphatic: Mild lymphadenopathy is again seen in the right
external iliac lymph node chain, largest measuring 1 point 7 cm
short axis on image 62/2. No other sites of lymphadenopathy
identified. No evidence of abdominal aortic aneurysm. Aortic
atherosclerotic calcification noted.

Reproductive: Prior hysterectomy noted. Adnexal regions are
unremarkable in appearance.

Other:  None.

Musculoskeletal:  No suspicious bone lesions identified.
IMPRESSION: Stable mild right external iliac lymphadenopathy. No new or
progressive disease within the abdomen or pelvis.

Colonic diverticulosis. No radiographic evidence of diverticulitis.

Aortic Atherosclerosis ([id number]).

## 2022-03-05 ENCOUNTER — Other Ambulatory Visit: Payer: Self-pay

## 2022-03-05 ENCOUNTER — Ambulatory Visit
Admission: RE | Admit: 2022-03-05 | Discharge: 2022-03-05 | Disposition: A | Discharge status: Home / Self Care | Payer: Medicare Other | Source: Ambulatory Visit | Attending: Radiation Oncology | Admitting: Radiation Oncology

## 2022-03-05 DIAGNOSIS — Z510 Encounter for antineoplastic radiation therapy: Secondary | ICD-10-CM | POA: Diagnosis not present

## 2022-03-05 DIAGNOSIS — C775 Secondary and unspecified malignant neoplasm of intrapelvic lymph nodes: Secondary | ICD-10-CM | POA: Diagnosis not present

## 2022-03-05 DIAGNOSIS — C787 Secondary malignant neoplasm of liver and intrahepatic bile duct: Secondary | ICD-10-CM | POA: Diagnosis not present

## 2022-03-06 ENCOUNTER — Ambulatory Visit
Admission: RE | Admit: 2022-03-06 | Discharge: 2022-03-06 | Disposition: A | Discharge status: Home / Self Care | Payer: Medicare Other | Source: Ambulatory Visit | Attending: Radiation Oncology | Admitting: Radiation Oncology

## 2022-03-06 DIAGNOSIS — C787 Secondary malignant neoplasm of liver and intrahepatic bile duct: Secondary | ICD-10-CM | POA: Diagnosis not present

## 2022-03-06 DIAGNOSIS — C775 Secondary and unspecified malignant neoplasm of intrapelvic lymph nodes: Secondary | ICD-10-CM | POA: Diagnosis not present

## 2022-03-06 DIAGNOSIS — Z510 Encounter for antineoplastic radiation therapy: Secondary | ICD-10-CM | POA: Diagnosis not present

## 2022-03-07 ENCOUNTER — Other Ambulatory Visit: Payer: Self-pay

## 2022-03-07 ENCOUNTER — Ambulatory Visit
Admission: RE | Admit: 2022-03-07 | Discharge: 2022-03-07 | Disposition: A | Discharge status: Home / Self Care | Payer: Medicare Other | Source: Ambulatory Visit | Attending: Radiation Oncology | Admitting: Radiation Oncology

## 2022-03-07 DIAGNOSIS — C775 Secondary and unspecified malignant neoplasm of intrapelvic lymph nodes: Secondary | ICD-10-CM | POA: Diagnosis not present

## 2022-03-07 DIAGNOSIS — Z510 Encounter for antineoplastic radiation therapy: Secondary | ICD-10-CM | POA: Diagnosis not present

## 2022-03-07 DIAGNOSIS — C787 Secondary malignant neoplasm of liver and intrahepatic bile duct: Secondary | ICD-10-CM | POA: Diagnosis not present

## 2022-03-08 ENCOUNTER — Ambulatory Visit
Admission: RE | Admit: 2022-03-08 | Discharge: 2022-03-08 | Disposition: A | Discharge status: Home / Self Care | Payer: Medicare Other | Source: Ambulatory Visit | Attending: Radiation Oncology | Admitting: Radiation Oncology

## 2022-03-08 DIAGNOSIS — Z510 Encounter for antineoplastic radiation therapy: Secondary | ICD-10-CM | POA: Diagnosis not present

## 2022-03-08 DIAGNOSIS — C787 Secondary malignant neoplasm of liver and intrahepatic bile duct: Secondary | ICD-10-CM | POA: Diagnosis not present

## 2022-03-08 DIAGNOSIS — C775 Secondary and unspecified malignant neoplasm of intrapelvic lymph nodes: Secondary | ICD-10-CM | POA: Diagnosis not present

## 2022-03-09 ENCOUNTER — Other Ambulatory Visit: Payer: Self-pay

## 2022-03-09 ENCOUNTER — Ambulatory Visit
Admission: RE | Admit: 2022-03-09 | Discharge: 2022-03-09 | Disposition: A | Discharge status: Home / Self Care | Payer: Medicare Other | Source: Ambulatory Visit | Attending: Radiation Oncology | Admitting: Radiation Oncology

## 2022-03-09 DIAGNOSIS — C787 Secondary malignant neoplasm of liver and intrahepatic bile duct: Secondary | ICD-10-CM | POA: Diagnosis not present

## 2022-03-09 DIAGNOSIS — C775 Secondary and unspecified malignant neoplasm of intrapelvic lymph nodes: Secondary | ICD-10-CM | POA: Diagnosis not present

## 2022-03-09 DIAGNOSIS — Z510 Encounter for antineoplastic radiation therapy: Secondary | ICD-10-CM | POA: Diagnosis not present

## 2022-03-12 ENCOUNTER — Other Ambulatory Visit: Payer: Self-pay

## 2022-03-12 ENCOUNTER — Ambulatory Visit
Admission: RE | Admit: 2022-03-12 | Discharge: 2022-03-12 | Disposition: A | Discharge status: Home / Self Care | Payer: Medicare Other | Source: Ambulatory Visit | Attending: Internal Medicine | Admitting: Internal Medicine

## 2022-03-12 ENCOUNTER — Ambulatory Visit
Admission: RE | Admit: 2022-03-12 | Discharge: 2022-03-12 | Disposition: A | Discharge status: Home / Self Care | Payer: Medicare Other | Source: Ambulatory Visit | Attending: Radiation Oncology | Admitting: Radiation Oncology

## 2022-03-12 DIAGNOSIS — I6381 Other cerebral infarction due to occlusion or stenosis of small artery: Secondary | ICD-10-CM | POA: Diagnosis not present

## 2022-03-12 DIAGNOSIS — G4452 New daily persistent headache (NDPH): Secondary | ICD-10-CM | POA: Diagnosis not present

## 2022-03-12 DIAGNOSIS — Z510 Encounter for antineoplastic radiation therapy: Secondary | ICD-10-CM | POA: Diagnosis not present

## 2022-03-12 DIAGNOSIS — C775 Secondary and unspecified malignant neoplasm of intrapelvic lymph nodes: Secondary | ICD-10-CM | POA: Diagnosis not present

## 2022-03-12 DIAGNOSIS — C787 Secondary malignant neoplasm of liver and intrahepatic bile duct: Secondary | ICD-10-CM | POA: Diagnosis not present

## 2022-03-12 DIAGNOSIS — I6782 Cerebral ischemia: Secondary | ICD-10-CM | POA: Diagnosis not present

## 2022-03-12 DIAGNOSIS — C541 Malignant neoplasm of endometrium: Secondary | ICD-10-CM

## 2022-03-12 MED ORDER — GADOBENATE DIMEGLUMINE 529 MG/ML IV SOLN
18.0000 mL | Freq: Once | INTRAVENOUS | Status: AC | PRN
  Administered 2022-03-12: 18 mL via INTRAVENOUS

## 2022-03-13 ENCOUNTER — Ambulatory Visit
Admission: RE | Admit: 2022-03-13 | Discharge: 2022-03-13 | Disposition: A | Discharge status: Home / Self Care | Payer: Medicare Other | Source: Ambulatory Visit | Attending: Radiation Oncology | Admitting: Radiation Oncology

## 2022-03-13 DIAGNOSIS — C775 Secondary and unspecified malignant neoplasm of intrapelvic lymph nodes: Secondary | ICD-10-CM | POA: Diagnosis not present

## 2022-03-13 DIAGNOSIS — C787 Secondary malignant neoplasm of liver and intrahepatic bile duct: Secondary | ICD-10-CM | POA: Diagnosis not present

## 2022-03-13 DIAGNOSIS — Z510 Encounter for antineoplastic radiation therapy: Secondary | ICD-10-CM | POA: Diagnosis not present

## 2022-03-14 ENCOUNTER — Other Ambulatory Visit: Payer: Self-pay

## 2022-03-14 ENCOUNTER — Ambulatory Visit
Admission: RE | Admit: 2022-03-14 | Discharge: 2022-03-14 | Disposition: A | Discharge status: Home / Self Care | Payer: Medicare Other | Source: Ambulatory Visit | Attending: Radiation Oncology | Admitting: Radiation Oncology

## 2022-03-14 DIAGNOSIS — C787 Secondary malignant neoplasm of liver and intrahepatic bile duct: Secondary | ICD-10-CM | POA: Diagnosis not present

## 2022-03-14 DIAGNOSIS — C775 Secondary and unspecified malignant neoplasm of intrapelvic lymph nodes: Secondary | ICD-10-CM | POA: Diagnosis not present

## 2022-03-14 DIAGNOSIS — Z510 Encounter for antineoplastic radiation therapy: Secondary | ICD-10-CM | POA: Diagnosis not present

## 2022-03-15 ENCOUNTER — Ambulatory Visit
Admission: RE | Admit: 2022-03-15 | Discharge: 2022-03-15 | Disposition: A | Discharge status: Home / Self Care | Payer: Medicare Other | Source: Ambulatory Visit | Attending: Radiation Oncology | Admitting: Radiation Oncology

## 2022-03-15 DIAGNOSIS — Z510 Encounter for antineoplastic radiation therapy: Secondary | ICD-10-CM | POA: Diagnosis not present

## 2022-03-15 DIAGNOSIS — C775 Secondary and unspecified malignant neoplasm of intrapelvic lymph nodes: Secondary | ICD-10-CM | POA: Diagnosis not present

## 2022-03-15 DIAGNOSIS — C787 Secondary malignant neoplasm of liver and intrahepatic bile duct: Secondary | ICD-10-CM | POA: Diagnosis not present

## 2022-03-16 ENCOUNTER — Other Ambulatory Visit: Payer: Self-pay

## 2022-03-16 ENCOUNTER — Ambulatory Visit
Admission: RE | Admit: 2022-03-16 | Discharge: 2022-03-16 | Disposition: A | Discharge status: Home / Self Care | Payer: Medicare Other | Source: Ambulatory Visit | Attending: Radiation Oncology | Admitting: Radiation Oncology

## 2022-03-16 DIAGNOSIS — Z510 Encounter for antineoplastic radiation therapy: Secondary | ICD-10-CM | POA: Diagnosis not present

## 2022-03-16 DIAGNOSIS — C787 Secondary malignant neoplasm of liver and intrahepatic bile duct: Secondary | ICD-10-CM | POA: Diagnosis not present

## 2022-03-16 DIAGNOSIS — C775 Secondary and unspecified malignant neoplasm of intrapelvic lymph nodes: Secondary | ICD-10-CM | POA: Diagnosis not present

## 2022-03-19 ENCOUNTER — Ambulatory Visit
Admission: RE | Admit: 2022-03-19 | Discharge: 2022-03-19 | Disposition: A | Discharge status: Home / Self Care | Payer: Medicare Other | Source: Ambulatory Visit | Attending: Radiation Oncology | Admitting: Radiation Oncology

## 2022-03-19 ENCOUNTER — Other Ambulatory Visit: Payer: Self-pay

## 2022-03-19 DIAGNOSIS — Z510 Encounter for antineoplastic radiation therapy: Secondary | ICD-10-CM | POA: Diagnosis not present

## 2022-03-19 DIAGNOSIS — C787 Secondary malignant neoplasm of liver and intrahepatic bile duct: Secondary | ICD-10-CM | POA: Diagnosis not present

## 2022-03-19 DIAGNOSIS — C775 Secondary and unspecified malignant neoplasm of intrapelvic lymph nodes: Secondary | ICD-10-CM | POA: Diagnosis not present

## 2022-03-20 ENCOUNTER — Ambulatory Visit
Admission: RE | Admit: 2022-03-20 | Discharge: 2022-03-20 | Disposition: A | Discharge status: Home / Self Care | Payer: Medicare Other | Source: Ambulatory Visit | Attending: Radiation Oncology | Admitting: Radiation Oncology

## 2022-03-20 DIAGNOSIS — Z510 Encounter for antineoplastic radiation therapy: Secondary | ICD-10-CM | POA: Diagnosis not present

## 2022-03-20 DIAGNOSIS — C775 Secondary and unspecified malignant neoplasm of intrapelvic lymph nodes: Secondary | ICD-10-CM | POA: Diagnosis not present

## 2022-03-20 DIAGNOSIS — C787 Secondary malignant neoplasm of liver and intrahepatic bile duct: Secondary | ICD-10-CM | POA: Diagnosis not present

## 2022-03-21 ENCOUNTER — Ambulatory Visit
Admission: RE | Admit: 2022-03-21 | Discharge: 2022-03-21 | Disposition: A | Discharge status: Home / Self Care | Payer: Medicare Other | Source: Ambulatory Visit | Attending: Radiation Oncology | Admitting: Radiation Oncology

## 2022-03-21 ENCOUNTER — Other Ambulatory Visit: Payer: Self-pay

## 2022-03-21 DIAGNOSIS — C787 Secondary malignant neoplasm of liver and intrahepatic bile duct: Secondary | ICD-10-CM | POA: Diagnosis not present

## 2022-03-21 DIAGNOSIS — Z510 Encounter for antineoplastic radiation therapy: Secondary | ICD-10-CM | POA: Diagnosis not present

## 2022-03-21 DIAGNOSIS — C775 Secondary and unspecified malignant neoplasm of intrapelvic lymph nodes: Secondary | ICD-10-CM | POA: Diagnosis not present

## 2022-03-22 ENCOUNTER — Ambulatory Visit
Admission: RE | Admit: 2022-03-22 | Discharge: 2022-03-22 | Disposition: A | Discharge status: Home / Self Care | Payer: Medicare Other | Source: Ambulatory Visit | Attending: Radiation Oncology | Admitting: Radiation Oncology

## 2022-03-22 DIAGNOSIS — C787 Secondary malignant neoplasm of liver and intrahepatic bile duct: Secondary | ICD-10-CM | POA: Diagnosis not present

## 2022-03-22 DIAGNOSIS — C775 Secondary and unspecified malignant neoplasm of intrapelvic lymph nodes: Secondary | ICD-10-CM | POA: Diagnosis not present

## 2022-03-22 DIAGNOSIS — Z510 Encounter for antineoplastic radiation therapy: Secondary | ICD-10-CM | POA: Diagnosis not present

## 2022-03-23 ENCOUNTER — Encounter: Payer: Self-pay | Admitting: Radiation Oncology

## 2022-03-23 ENCOUNTER — Ambulatory Visit
Admission: RE | Admit: 2022-03-23 | Discharge: 2022-03-23 | Disposition: A | Discharge status: Home / Self Care | Payer: Medicare Other | Source: Ambulatory Visit | Attending: Radiation Oncology | Admitting: Radiation Oncology

## 2022-03-23 ENCOUNTER — Other Ambulatory Visit: Payer: Self-pay

## 2022-03-23 DIAGNOSIS — C787 Secondary malignant neoplasm of liver and intrahepatic bile duct: Secondary | ICD-10-CM | POA: Diagnosis not present

## 2022-03-23 DIAGNOSIS — Z510 Encounter for antineoplastic radiation therapy: Secondary | ICD-10-CM | POA: Diagnosis not present

## 2022-03-23 DIAGNOSIS — C775 Secondary and unspecified malignant neoplasm of intrapelvic lymph nodes: Secondary | ICD-10-CM | POA: Diagnosis not present

## 2022-03-26 ENCOUNTER — Other Ambulatory Visit: Payer: Self-pay

## 2022-03-26 ENCOUNTER — Inpatient Hospital Stay (HOSPITAL_BASED_OUTPATIENT_CLINIC_OR_DEPARTMENT_OTHER): Payer: Medicare Other | Admitting: Hematology and Oncology

## 2022-03-26 ENCOUNTER — Inpatient Hospital Stay: Payer: Medicare Other | Attending: Gynecologic Oncology

## 2022-03-26 ENCOUNTER — Encounter: Payer: Self-pay | Admitting: Hematology and Oncology

## 2022-03-26 VITALS — BP 130/65 | HR 65 | Temp 99.4°F | Resp 18 | Ht 64.0 in | Wt 206.4 lb

## 2022-03-26 DIAGNOSIS — I6782 Cerebral ischemia: Secondary | ICD-10-CM | POA: Diagnosis not present

## 2022-03-26 DIAGNOSIS — Z888 Allergy status to other drugs, medicaments and biological substances status: Secondary | ICD-10-CM | POA: Insufficient documentation

## 2022-03-26 DIAGNOSIS — I700 Atherosclerosis of aorta: Secondary | ICD-10-CM | POA: Diagnosis not present

## 2022-03-26 DIAGNOSIS — K5730 Diverticulosis of large intestine without perforation or abscess without bleeding: Secondary | ICD-10-CM | POA: Diagnosis not present

## 2022-03-26 DIAGNOSIS — Z881 Allergy status to other antibiotic agents status: Secondary | ICD-10-CM | POA: Insufficient documentation

## 2022-03-26 DIAGNOSIS — C541 Malignant neoplasm of endometrium: Secondary | ICD-10-CM

## 2022-03-26 DIAGNOSIS — Z79899 Other long term (current) drug therapy: Secondary | ICD-10-CM | POA: Insufficient documentation

## 2022-03-26 DIAGNOSIS — G4452 New daily persistent headache (NDPH): Secondary | ICD-10-CM | POA: Insufficient documentation

## 2022-03-26 DIAGNOSIS — R590 Localized enlarged lymph nodes: Secondary | ICD-10-CM | POA: Insufficient documentation

## 2022-03-26 DIAGNOSIS — Z923 Personal history of irradiation: Secondary | ICD-10-CM | POA: Diagnosis not present

## 2022-03-26 DIAGNOSIS — Z7901 Long term (current) use of anticoagulants: Secondary | ICD-10-CM | POA: Insufficient documentation

## 2022-03-26 DIAGNOSIS — Z7189 Other specified counseling: Secondary | ICD-10-CM

## 2022-03-26 DIAGNOSIS — Z8673 Personal history of transient ischemic attack (TIA), and cerebral infarction without residual deficits: Secondary | ICD-10-CM | POA: Insufficient documentation

## 2022-03-26 LAB — CBC WITH DIFFERENTIAL (CANCER CENTER ONLY)
Abs Immature Granulocytes: 0.02 10*3/uL (ref 0.00–0.07)
Basophils Absolute: 0 10*3/uL (ref 0.0–0.1)
Basophils Relative: 1 %
Eosinophils Absolute: 0.2 10*3/uL (ref 0.0–0.5)
Eosinophils Relative: 6 %
HCT: 35 % — ABNORMAL LOW (ref 36.0–46.0)
Hemoglobin: 11.3 g/dL — ABNORMAL LOW (ref 12.0–15.0)
Immature Granulocytes: 1 %
Lymphocytes Relative: 20 %
Lymphs Abs: 0.6 10*3/uL — ABNORMAL LOW (ref 0.7–4.0)
MCH: 24 pg — ABNORMAL LOW (ref 26.0–34.0)
MCHC: 32.3 g/dL (ref 30.0–36.0)
MCV: 74.3 fL — ABNORMAL LOW (ref 80.0–100.0)
Monocytes Absolute: 0.3 10*3/uL (ref 0.1–1.0)
Monocytes Relative: 9 %
Neutro Abs: 1.9 10*3/uL (ref 1.7–7.7)
Neutrophils Relative %: 63 %
Platelet Count: 180 10*3/uL (ref 150–400)
RBC: 4.71 MIL/uL (ref 3.87–5.11)
RDW: 17.8 % — ABNORMAL HIGH (ref 11.5–15.5)
WBC Count: 3.1 10*3/uL — ABNORMAL LOW (ref 4.0–10.5)
nRBC: 0 % (ref 0.0–0.2)

## 2022-03-26 LAB — CMP (CANCER CENTER ONLY)
ALT: 8 U/L (ref 0–44)
AST: 14 U/L — ABNORMAL LOW (ref 15–41)
Albumin: 3.6 g/dL (ref 3.5–5.0)
Alkaline Phosphatase: 89 U/L (ref 38–126)
Anion gap: 6 (ref 5–15)
BUN: 11 mg/dL (ref 8–23)
CO2: 29 mmol/L (ref 22–32)
Calcium: 8.9 mg/dL (ref 8.9–10.3)
Chloride: 104 mmol/L (ref 98–111)
Creatinine: 0.78 mg/dL (ref 0.44–1.00)
GFR, Estimated: >60 mL/min (ref >60)
Glucose, Bld: 118 mg/dL — ABNORMAL HIGH (ref 70–99)
Potassium: 4 mmol/L (ref 3.5–5.1)
Sodium: 139 mmol/L (ref 135–145)
Total Bilirubin: 0.5 mg/dL (ref 0.3–1.2)
Total Protein: 6.5 g/dL (ref 6.5–8.1)

## 2022-03-26 MED ORDER — SODIUM CHLORIDE 0.9% FLUSH
10.0000 mL | Freq: Once | INTRAVENOUS | Status: AC
  Administered 2022-03-26: 10 mL

## 2022-03-26 MED ORDER — HEPARIN SOD (PORK) LOCK FLUSH 100 UNIT/ML IV SOLN
500.0000 [IU] | Freq: Once | INTRAVENOUS | Status: AC
  Administered 2022-03-26: 500 [IU]

## 2022-03-26 NOTE — Assessment & Plan Note (Signed)
She had completed radiation treatment without any side effects whatsoever ?We discussed the signs and symptoms to watch out for ?I plan to repeat imaging study 3 months away in June for objective assessment of response to treatment ?She is in agreement ?

## 2022-03-26 NOTE — Progress Notes (Signed)
[place] ?OFFICE PROGRESS NOTE ? ?Patient Care Team: ?[name], MD as PCP - General (Internal Medicine) ?Belva Crome, MD as PCP - Cardiology (Cardiology) ?Axel Filler, MD ? ?ASSESSMENT & PLAN:  ?Endometrial cancer [facility]) ?She had completed radiation treatment without any side effects whatsoever ?We discussed the signs and symptoms to watch out for ?I plan to repeat imaging study 3 months away in June for objective assessment of response to treatment ?She is in agreement ? ?Orders Placed This Encounter  ?Procedures  ? CT Abdomen Pelvis Wo Contrast  ?  Standing Status:   Future  ?  Standing Expiration Date:   03/26/2023  ?  Order Specific Question:   Preferred imaging location?  ?  Answer:   [facility]  ?  Order Specific Question:   Is Oral Contrast requested for this exam?  ?  Answer:   Yes, Per Radiology protocol  ? ? ?All questions were answered. The patient knows to call the clinic with any problems, questions or concerns. ?The total time spent in the appointment was 20 minutes encounter with patients including review of chart and various tests results, discussions about plan of care and coordination of care plan ?  ?[name], MD ?03/26/2022 9:35 AM ? ?INTERVAL HISTORY: ?Please see below for problem oriented charting. ?she returns for treatment follow-up ?She is doing well ?She completed radiation treatment last Friday on March 24 ?She denies abdominal pain or changes in bowel habits ? ?REVIEW OF SYSTEMS:   ?Constitutional: Denies fevers, chills or abnormal weight loss ?Eyes: Denies blurriness of vision ?Ears, nose, mouth, throat, and face: Denies mucositis or sore throat ?Respiratory: Denies cough, dyspnea or wheezes ?Cardiovascular: Denies palpitation, chest discomfort or lower extremity swelling ?Gastrointestinal:  Denies nausea, heartburn or change in bowel habits ?Skin: Denies abnormal skin rashes ?Lymphatics: Denies new lymphadenopathy or easy  bruising ?Neurological:Denies numbness, tingling or new weaknesses ?Behavioral/Psych: Mood is stable, no new changes  ?All other systems were reviewed with the patient and are negative. ? ?I have reviewed the past medical history, past surgical history, social history and family history with the patient and they are unchanged from previous note. ? ?ALLERGIES:  is allergic to benicar hct [olmesartan medoxomil-hctz], iodinated contrast media, bactrim [sulfamethoxazole-trimethoprim], pravastatin, shellfish allergy, and amlodipine. ? ?MEDICATIONS:  ?Current Outpatient Medications  ?Medication Sig Dispense Refill  ? acetaminophen (TYLENOL) 500 MG tablet Take 1,000 mg by mouth every 6 (six) hours as needed for moderate pain.    ? apixaban (ELIQUIS) 5 MG TABS tablet Take 1 tablet (5 mg total) by mouth 2 (two) times daily. 180 tablet 1  ? APPLE CIDER VINEGAR PO Take 5 mLs by mouth daily.     ? atenolol (TENORMIN) 25 MG tablet TAKE 1 TABLET (25 MG TOTAL) BY MOUTH DAILY. 90 tablet 1  ? Cholecalciferol (VITAMIN D3) 50 MCG (2000 UT) TABS Take by mouth.    ? EPINEPHrine (EPIPEN 2-PAK) 0.3 mg/0.3 mL IJ SOAJ injection Inject 0.3 mg into the muscle as needed for anaphylaxis. 1 each 2  ? levocetirizine (XYZAL) 5 MG tablet Can use up to 2 tablets in the morning and 2 tablets at night for hives/itching. This is maximum dose. Titrate as needed. 120 tablet 3  ? levothyroxine (SYNTHROID) 100 MCG tablet Take 100 mcg by mouth daily before breakfast.    ? Multiple Vitamin (MULTIVITAMIN) capsule Take 1 capsule by mouth daily.    ? ondansetron (ZOFRAN) 4 MG tablet Take 1 tablet (4  mg total) by mouth every 8 (eight) hours as needed for nausea or vomiting. 20 tablet 0  ? polyethylene glycol (MIRALAX / GLYCOLAX) 17 g packet Take 17 g by mouth daily as needed for mild constipation.    ? triamcinolone cream (KENALOG) 0.1 % Apply 1 application topically 2 (two) times daily. 30 g 1  ? ?No current facility-administered medications for this visit.   ? ? ?SUMMARY OF ONCOLOGIC HISTORY: ?Oncology History Overview Note  ?Poorly differentiated carcinoma, mixed histology with squamous differentiation, rare focus of clear cells as well as serous features ?MSI stable ?Her2 negative ?  ?Endometrial cancer ([place])  ?09/01/2019 Initial Diagnosis  ? The patient reported a history of postmenopausal bleeding that began 1 to 2 months before diagnosis ?  ?09/14/2019 Imaging  ? US pelvis ?1. Enlarged uterus with numerous myometrial masses, presumably fibroids. ?2. Endometrial thickness of 6.2 mm. In the setting of post-menopausal bleeding, endometrial sampling is indicated to exclude carcinoma. If results are benign, sonohysterogram should be considered for focal lesion work-up.  ?3. Nonvisualized ovaries ?  ?09/23/2019 Pathology Results  ? A. ENDOMETRIUM, BIOPSY:  ?- Poorly differentiated carcinoma ?  ?10/15/2019 Imaging  ? Ct scan of chest, abdomen and pelvis: ?No evidence of metastatic disease within the chest, abdomen, or pelvis. ?  ?Enlarged fibroid uterus. ?  ?Colonic diverticulosis. No radiographic evidence of diverticulitis. ?  ?Aortic and coronary artery atherosclerosis. ?  ?  ?10/22/2019 Pathology Results  ? SURGICAL PATHOLOGY  ? ?FINAL MICROSCOPIC DIAGNOSIS:  ? ?A. UTERUS, BILATERAL TUBES AND OVARIES, HYSTERECTOMY:  ?Poorly differentiated carcinoma, 6.5 cm.  ?Lymphovascular involvement by tumor.  ?Carcinoma involves inner half of the myometrium.  ?Margins not involved.  ?Cervix, bilateral fallopian tubes and bilateral ovaries free of tumor.  ? ?B. LYMPH NODE, RIGHT EXTERNAL SENTINEL, BIOPSY:  ?One lymph node with no metastatic carcinoma (0/1).  ? ?C. LYMPH NODE, RIGHT PELVIC, BIOPSY  ?Five lymph nodes with no metastatic carcinoma (0/5).  ? ?D. LYMPH NODE, LEFT PELVIC, BIOPSY:  ?Five lymph nodes with no metastatic carcinoma (0/5).  ? ?E. LYMPH NODE, RIGHT PERI AORTIC, BIOPSY:  ?One lymph node with no metastatic carcinoma (0/1).  ? ?F. LYMPH NODE, LEFT PERI AORTIC, BIOPSY:   ?Five lymph nodes with no metastatic carcinoma (0/5).  ? ? ?ONCOLOGY TABLE:  ?UTERUS, CARCINOMA OR CARCINOSARCOMA  ? ?Procedure: Total hysterectomy with bilateral f-oophorectomy and sentinel  ?lymph nodes.  ?Histologic type: Poorly differentiated carcinoma, see comment.  ?Histologic Grade: High-grade, FIGO 3.  ?Myometrial invasion:  ?     Depth of invasion: 13 mm  ?     Myometrial thickness: 40 mm  ?Uterine Serosa Involvement: Not identified  ?Cervical stromal involvement: Not identified  ?Extent of involvement of other organs: Not identified  ?Lymphovascular invasion: Present  ?Regional Lymph Nodes:  ?     Examined:     17 Sentinel  ?                             0 non-sentinel  ?                             17 total  ?      Lymph nodes with metastasis: 0  ?      Isolated tumor cells (<0.2 mm): 0  ?      Micrometastasis:  (>0.2 mm and < 2.0 mm): 0  ?  Macrometastasis: (>2.0 mm): 0  ?Representative Tumor Block: A5, A6, A7 and A8.  ?MMR / MSI testing: Pending  ?Pathologic Stage Classification (pTNM, AJCC 8th edition):  pT1a, pN0  ?Comments: The carcinoma is a high-grade poorly differentiated carcinoma which morphologically has predominantly serous features.  There are a few foci with squamous differentiation and a rare focus of clear cell features.  Immunohistochemistry for cytokeratin AE1/AE3 is performed on the sentinel lymph nodes and no positivity is identified.  ?  ?10/22/2019 Surgery  ? Pre-operative Diagnosis: endometrial cancer grade 3 ?  ?Post-operative Diagnosis: same,  ?  ?Operation: Robotic-assisted laparoscopic total hysterectomy for uterus >250gm with bilateral salpingoophorectomy, SLN mapping, bilateral pelvic and para-aortic lymphadenectomy. ?  ?Surgeon: Donaciano Eva ?   ?Operative Findings:  : 16cm bulky fibroid uterus, normal ovaries bilaterally, no suspicious lymph nodes. ?  ?  ?11/10/2019 Cancer Staging  ? Staging form: Corpus Uteri - Carcinoma and Carcinosarcoma, AJCC 8th Edition ?-  Pathologic: Stage IVB (pT1a, pN0, cM1) - Signed by [name], MD on 05/16/2020 ? ?  ?05/10/2020 Imaging  ? 1. New hypodense 2.0 cm segment 4A left liver lobe mass, suspicious for hepatic metastasis. ?2. New left pelvic sid

## 2022-04-02 NOTE — Progress Notes (Signed)
? ?Cardiology Office Note   ? ?Date:  04/11/2022  ? ?ID:  Tina Stone, DOB [birth year], MRN [id number] ? ? ?PCP:  Tina Lima, MD ?  ?[place]  ?Cardiologist:  Tina Grooms, MD   ?Advanced Practice Provider:  No care team member to display ?Electrophysiologist:  None  ? ?[id number]}  ? ?Chief Complaint  ?Patient presents with  ? Follow-up  ? ? ?History of Present Illness:  ?Tina Stone is a 72 y.o. female with history of AF 180/m 01/2021 converted with diltiazem, HTN, prediabetes, obesity. Patient seen in Afib clinic and was told chronic long term anticoag recommended. She saw Dr. Tamala Stone 08/2021 for second opinion and long term monitor recommended but she declined to wear. He discussed loop recorder. CHADsVASC=3 so she decided to continue anticoag. ? ?Patient comes in for f/u. Denies chest pain, dyspnea, palpitations, dizziness, edema. Labs drawn by PCP 04/09/22 reviewed. LDL 159. Stopped lipitor due to muscle cramps, pravastatin in past caused abdominal pain. No regular exercise. Going to get back to swimming starting tomorrow.  ? ? ? ?Past Medical History:  ?Diagnosis Date  ? Adenomatous colon polyp   ? Allergic rhinitis, seasonal   ? Allergy   ? Beta thalassemia trait 11/25/2013  ? Cholelithiasis   ? Class 3 obesity without serious comorbidity with body mass index (BMI) of 40.0 to 44.9 in adult 11/19/2012  ? endometrial ca dx'd 08/2009  ? endometrial   ? GERD (gastroesophageal reflux disease)   ? HLD (hyperlipidemia)   ? Hypercholesterolemia   ? Hypertension 03/18/2017  ? no meds   ? Intraductal papilloma of left breast   ? Patient underwent left needle-localized lumpectomy by Dr. Imogene Burn. Tsuei on 09/09/2013; pathology showed intraductal papilloma with no atypia or malignancy identified.  ? PONV (postoperative nausea and vomiting)   ? Pre-diabetes   ? pt denies  ? Urticaria   ? Uterine fibroid   ? ? ?Past Surgical History:  ?Procedure Laterality Date  ? [place]  ? right  ? BREAST EXCISIONAL BIOPSY Left 2014  ? benign  ? BREAST LUMPECTOMY WITH NEEDLE LOCALIZATION Left 09/09/2013  ? Procedure: BREAST LUMPECTOMY WITH NEEDLE LOCALIZATION;  Surgeon: Imogene Burn. Georgette Dover, MD;  Location: [facility];  Service: General;  Laterality: Left;  ? CHOLECYSTECTOMY    ? COLONOSCOPY    ? IR IMAGING GUIDED PORT INSERTION  05/18/2020  ? POLYPECTOMY    ? ROBOTIC ASSISTED LAPAROSCOPIC CHOLECYSTECTOMY  09/09/2019  ? ROBOTIC ASSISTED TOTAL HYSTERECTOMY WITH BILATERAL SALPINGO OOPHERECTOMY N/A 10/22/2019  ? Procedure: XI ROBOTIC ASSISTED TOTAL HYSTERECTOMY WITH BILATERAL SALPINGO OOPHORECTOMY GREATER THAN 250 GRAMS, MINI LAPARTOMY FOR SPECIMEN DELIVERY; PELVIC AND PERI-AORTIC LYMPHADENECTOMY;  Surgeon: Everitt Amber, MD;  Location: [facility];  Service: Gynecology;  Laterality: N/A;  ? SENTINEL NODE BIOPSY N/A 10/22/2019  ? Procedure: SENTINEL NODE BIOPSY;  Surgeon: Everitt Amber, MD;  Location: [facility];  Service: Gynecology;  Laterality: N/A;  ? ? ?Current Medications: ?Current Meds  ?Medication Sig  ? acetaminophen (TYLENOL) 500 MG tablet Take 1,000 mg by mouth every 6 (six) hours as needed for moderate pain.  ? apixaban (ELIQUIS) 5 MG TABS tablet Take 1 tablet (5 mg total) by mouth 2 (two) times daily.  ? APPLE CIDER VINEGAR PO Take 5 mLs by mouth daily.   ? atenolol (TENORMIN) 25 MG tablet TAKE 1 TABLET (25 MG TOTAL) BY MOUTH DAILY.  ? Cholecalciferol (VITAMIN D3) 50 MCG (2000 UT)  TABS Take by mouth.  ? EPINEPHrine (EPIPEN 2-PAK) 0.3 mg/0.3 mL IJ SOAJ injection Inject 0.3 mg into the muscle as needed for anaphylaxis.  ? levocetirizine (XYZAL) 5 MG tablet Can use up to 2 tablets in the morning and 2 tablets at night for hives/itching. This is maximum dose. Titrate as needed.  ? levothyroxine (SYNTHROID) 100 MCG tablet Take 100 mcg by mouth daily before breakfast.  ? Multiple Vitamin (MULTIVITAMIN) capsule Take 1 capsule by mouth daily.  ? ondansetron (ZOFRAN) 4 MG tablet Take 1 tablet (4 mg total) by  mouth every 8 (eight) hours as needed for nausea or vomiting.  ? pantoprazole (PROTONIX) 40 MG tablet Take 1 tablet (40 mg total) by mouth daily.  ? PFIZER COVID-19 VAC BIVALENT injection   ? polyethylene glycol (MIRALAX / GLYCOLAX) 17 g packet Take 17 g by mouth daily as needed for mild constipation.  ? rosuvastatin (CRESTOR) 10 MG tablet Take 1 tablet (10 mg total) by mouth daily.  ? triamcinolone cream (KENALOG) 0.1 % Apply 1 application topically 2 (two) times daily.  ?  ? ?Allergies:   Benicar hct [olmesartan medoxomil-hctz], Iodinated contrast media, Bactrim [sulfamethoxazole-trimethoprim], Pravastatin, Shellfish allergy, and Amlodipine  ? ?Social History  ? ?Socioeconomic History  ? Marital status: Widowed  ?  Spouse name: Not on file  ? Number of children: Not on file  ? Years of education: Not on file  ? Highest education level: Not on file  ?Occupational History  ? Not on file  ?Tobacco Use  ? Smoking status: Never  ? Smokeless tobacco: Never  ? Tobacco comments:  ?  few puffs but not a true smoker quit many yrs ago  ?Vaping Use  ? Vaping Use: Never used  ?Substance and Sexual Activity  ? Alcohol use: Never  ? Drug use: Never  ? Sexual activity: Not Currently  ?Other Topics Concern  ? Not on file  ?Social History Narrative  ? ** Merged History Encounter **  ?    ? Lives in [place], widowed 2003  ? 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   ?   ? ?Social Determinants of Health  ? ?Financial Resource Strain: Not on file  ?Food Insecurity: Not on file  ?Transportation Needs: Not on file  ?Physical Activity: Not on file  ?Stress: Not on file  ?Social Connections: Not on file  ?  ? ?Family History:  The patient's  family history includes Asthma in her brother and paternal grandmother; Colon polyps (age of onset: 13) in her mother; Congestive Heart Failure in her father; Dementia (age of onset: 51) in her mother; Diabetes in her father and mother; Hypertension in her mother; Pancreatic cancer in her  paternal aunt.  ? ?ROS:   ?Please see the history of present illness.    ?ROS All other systems reviewed and are negative. ? ? ?PHYSICAL EXAM:   ?VS:  BP 126/84 (BP Location: Left Arm, Patient Position: Sitting, Cuff Size: Normal)   Pulse 65   Ht 5' 4.8" (1.646 m)   Wt 206 lb 3.2 oz (93.5 kg)   SpO2 100%   BMI 34.53 kg/m?   ?Physical Exam  ?GEN: Obese, in no acute distress  ?Neck: no JVD, carotid bruits, or masses ?Cardiac:RRR; no murmurs, rubs, or gallops  ?Respiratory:  clear to auscultation bilaterally, normal work of breathing ?GI: soft, nontender, nondistended, + BS ?Ext: without cyanosis, clubbing, or edema, Good distal pulses bilaterally ?Neuro:  Alert and Oriented x 3 ?  Psych: euthymic mood, full affect ? ?Wt Readings from Last 3 Encounters:  ?04/11/22 206 lb 3.2 oz (93.5 kg)  ?04/09/22 206 lb (93.4 kg)  ?03/26/22 206 lb 6.4 oz (93.6 kg)  ?  ? ? ?Studies/Labs Reviewed:  ? ?EKG:  EKG is not ordered today.    ? ?Recent Labs: ?04/09/2022: ALT 10; BUN 11; Creatinine, Ser 0.86; Hemoglobin 12.3; Magnesium 2.0; Platelets 197.0; Potassium 4.3; Sodium 137; TSH 6.63  ? ?Lipid Panel ?   ?Component Value Date/Time  ? CHOL 255 (H) 04/09/2022 1039  ? CHOL 272 (H) 03/18/2017 0901  ? TRIG 57.0 04/09/2022 1039  ? HDL 84.20 04/09/2022 1039  ? HDL 64 03/18/2017 0901  ? CHOLHDL 3 04/09/2022 1039  ? VLDL 11.4 04/09/2022 1039  ? LDLCALC 159 (H) 04/09/2022 1039  ? [place] 191 (H) 03/18/2017 0901  ? ? ?Additional studies/ records that were reviewed today include:  ?ECHOCARDIOGRAPHY 03/06/2021: ?IMPRESSIONS  ? ? ? 1. Left ventricular ejection fraction, by estimation, is 55 to 60%. The  ?left ventricle has normal function. The left ventricle has no regional  ?wall motion abnormalities. Left ventricular diastolic parameters are  ?consistent with Grade I diastolic  ?dysfunction (impaired relaxation).  ? 2. Right ventricular systolic function is normal. The right ventricular  ?size is normal. There is normal pulmonary artery systolic  pressure.  ? 3. The mitral valve is abnormal. Trivial mitral valve regurgitation.  ? 4. The aortic valve is tricuspid. Aortic valve regurgitation is not  ?visualized.  ? 5. Pulmonic valve: Mild leaflet calcificat

## 2022-04-09 ENCOUNTER — Encounter: Payer: Self-pay | Admitting: Internal Medicine

## 2022-04-09 ENCOUNTER — Ambulatory Visit (INDEPENDENT_AMBULATORY_CARE_PROVIDER_SITE_OTHER): Payer: Medicare Other | Admitting: Internal Medicine

## 2022-04-09 VITALS — BP 122/68 | HR 65 | Temp 98.5°F | Ht 64.0 in | Wt 206.0 lb

## 2022-04-09 DIAGNOSIS — E039 Hypothyroidism, unspecified: Secondary | ICD-10-CM

## 2022-04-09 DIAGNOSIS — R1013 Epigastric pain: Secondary | ICD-10-CM

## 2022-04-09 DIAGNOSIS — E785 Hyperlipidemia, unspecified: Secondary | ICD-10-CM | POA: Diagnosis not present

## 2022-04-09 DIAGNOSIS — R7303 Prediabetes: Secondary | ICD-10-CM | POA: Diagnosis not present

## 2022-04-09 DIAGNOSIS — I10 Essential (primary) hypertension: Secondary | ICD-10-CM

## 2022-04-09 DIAGNOSIS — E559 Vitamin D deficiency, unspecified: Secondary | ICD-10-CM | POA: Diagnosis not present

## 2022-04-09 LAB — BASIC METABOLIC PANEL
BUN: 11 mg/dL (ref 6–23)
CO2: 29 mEq/L (ref 19–32)
Calcium: 9.7 mg/dL (ref 8.4–10.5)
Chloride: 101 mEq/L (ref 96–112)
Creatinine, Ser: 0.86 mg/dL (ref 0.40–1.20)
GFR: 68.99 mL/min (ref >60.00)
Glucose, Bld: 86 mg/dL (ref 70–99)
Potassium: 4.3 mEq/L (ref 3.5–5.1)
Sodium: 137 mEq/L (ref 135–145)

## 2022-04-09 LAB — CBC WITH DIFFERENTIAL/PLATELET
Basophils Absolute: 0 10*3/uL (ref 0.0–0.1)
Basophils Relative: 0.5 % (ref 0.0–3.0)
Eosinophils Absolute: 0.7 10*3/uL (ref 0.0–0.7)
Eosinophils Relative: 18.4 % — ABNORMAL HIGH (ref 0.0–5.0)
HCT: 38.3 % (ref 36.0–46.0)
Hemoglobin: 12.3 g/dL (ref 12.0–15.0)
Lymphocytes Relative: 25.3 % (ref 12.0–46.0)
Lymphs Abs: 1 10*3/uL (ref 0.7–4.0)
MCHC: 32.2 g/dL (ref 30.0–36.0)
MCV: 76.2 fl — ABNORMAL LOW (ref 78.0–100.0)
Monocytes Absolute: 0.3 10*3/uL (ref 0.1–1.0)
Monocytes Relative: 7.8 % (ref 3.0–12.0)
Neutro Abs: 1.9 10*3/uL (ref 1.4–7.7)
Neutrophils Relative %: 48 % (ref 43.0–77.0)
Platelets: 197 10*3/uL (ref 150.0–400.0)
RBC: 5.02 Mil/uL (ref 3.87–5.11)
RDW: 18.6 % — ABNORMAL HIGH (ref 11.5–15.5)
WBC: 4 10*3/uL (ref 4.0–10.5)

## 2022-04-09 LAB — URINALYSIS, ROUTINE W REFLEX MICROSCOPIC
Bilirubin Urine: NEGATIVE
Ketones, ur: NEGATIVE
Leukocytes,Ua: NEGATIVE
Nitrite: NEGATIVE
Specific Gravity, Urine: 1.01 (ref 1.000–1.030)
Total Protein, Urine: NEGATIVE
Urine Glucose: NEGATIVE
Urobilinogen, UA: 0.2 (ref 0.0–1.0)
WBC, UA: NONE SEEN (ref 0–?)
pH: 6 (ref 5.0–8.0)

## 2022-04-09 LAB — T4, FREE: Free T4: 0.79 ng/dL (ref 0.60–1.60)

## 2022-04-09 LAB — LIPID PANEL
Cholesterol: 255 mg/dL — ABNORMAL HIGH (ref 0–200)
HDL: 84.2 mg/dL (ref >39.00)
LDL Cholesterol: 159 mg/dL — ABNORMAL HIGH (ref 0–99)
NonHDL: 170.57
Total CHOL/HDL Ratio: 3
Triglycerides: 57 mg/dL (ref 0.0–149.0)
VLDL: 11.4 mg/dL (ref 0.0–40.0)

## 2022-04-09 LAB — MAGNESIUM: Magnesium: 2 mg/dL (ref 1.5–2.5)

## 2022-04-09 LAB — TSH: TSH: 6.63 u[IU]/mL — ABNORMAL HIGH (ref 0.35–5.50)

## 2022-04-09 LAB — HEPATIC FUNCTION PANEL
ALT: 10 U/L (ref 0–35)
AST: 18 U/L (ref 0–37)
Albumin: 4 g/dL (ref 3.5–5.2)
Alkaline Phosphatase: 94 U/L (ref 39–117)
Bilirubin, Direct: 0.1 mg/dL (ref 0.0–0.3)
Total Bilirubin: 0.5 mg/dL (ref 0.2–1.2)
Total Protein: 7.2 g/dL (ref 6.0–8.3)

## 2022-04-09 LAB — LIPASE: Lipase: 60 U/L — ABNORMAL HIGH (ref 11.0–59.0)

## 2022-04-09 LAB — VITAMIN D 25 HYDROXY (VIT D DEFICIENCY, FRACTURES): VITD: 42.24 ng/mL (ref 30.00–100.00)

## 2022-04-09 MED ORDER — PANTOPRAZOLE SODIUM 40 MG PO TBEC: 40.0000 mg | DELAYED_RELEASE_TABLET | Freq: Every day | ORAL | 3 refills | Status: DC

## 2022-04-09 NOTE — Patient Instructions (Addendum)
Please take all new medication as prescribed - the protonix ? ?Ok to continue the Vit D at 5000 u for now ? ?Please continue all other medications as before, and refills have been done if requested. ? ?Please have the pharmacy call with any other refills you may need. ? ?Please continue your efforts at being more active, low cholesterol diet, and weight control. ? ?You are otherwise up to date with prevention measures today. ? ?Please keep your appointments with your specialists as you may have planned - cardiology  ? ?We have discussed the Cardiac CT Score test to measure the calcification level (if any) in your heart arteries.  This test has been ordered in our [place], so please call [facility] directly, as they prefer this, at [phone] to be scheduled. ? ?Please go to the LAB at the blood drawing area for the tests to be done ? ?You will be contacted by phone if any changes need to be made immediately.  Otherwise, you will receive a letter about your results with an explanation, but please check with MyChart first. ? ?Please remember to sign up for MyChart if you have not done so, as this will be important to you in the future with finding out test results, communicating by private email, and scheduling acute appointments online when needed. ? ?Please see Dr Ronnald Ramp in 2-3 months ?

## 2022-04-09 NOTE — Progress Notes (Signed)
Patient ID: Tina Stone, female   DOB: 05/10/1950, 72 y.o.   MRN: [id number] ? ? ? ?    Chief Complaint: follow up low thyroid, low vit d, hld, epigastric pain,, low magnesium  ? ?     HPI:  Tina Stone is a 72 y.o. female  - pt of Dr Ronnald Ramp, here for f/u as PCP not available.  Pt denies chest pain, increased sob or doe, wheezing, orthopnea, PND, increased LE swelling, palpitations, dizziness or syncope, except has had mild increased HR, nervous, hair loss and insomnia.     Pt denies polydipsia, polyuria, or new focal neuro s/s.    Pt denies fever, wt loss, night sweats, loss of appetite, or other constitutional symptoms  Denies hyper or hypo thyroid symptoms such as voice, skin or hair change.  Now taking Vit d 5000 u qd.  Trying to folow lower chol diet.  Has had low Magnesium recently per pt, asks for f/u.  Also has 1 mo onset epigastric pain without radiation, mild intermittent nausea, overall mild intermittent, dull achy, no vomiting, fever, blood or recent wt loss, and Denies worsening reflux, other abd pain, dysphagia, bowel change.  S/p CCX.   ?      ?Wt Readings from Last 3 Encounters:  ?04/11/22 206 lb 3.2 oz (93.5 kg)  ?04/09/22 206 lb (93.4 kg)  ?03/26/22 206 lb 6.4 oz (93.6 kg)  ? ?BP Readings from Last 3 Encounters:  ?04/11/22 126/84  ?04/09/22 122/68  ?03/26/22 130/65  ? ?      ?Past Medical History:  ?Diagnosis Date  ? Adenomatous colon polyp   ? Allergic rhinitis, seasonal   ? Allergy   ? Beta thalassemia trait 11/25/2013  ? Cholelithiasis   ? Class 3 obesity without serious comorbidity with body mass index (BMI) of 40.0 to 44.9 in adult 11/19/2012  ? endometrial ca dx'd 08/2009  ? endometrial   ? GERD (gastroesophageal reflux disease)   ? HLD (hyperlipidemia)   ? Hypercholesterolemia   ? Hypertension 03/18/2017  ? no meds   ? Intraductal papilloma of left breast   ? Patient underwent left needle-localized lumpectomy by Dr. Imogene Burn. Tsuei on 09/09/2013; pathology showed intraductal  papilloma with no atypia or malignancy identified.  ? PONV (postoperative nausea and vomiting)   ? Pre-diabetes   ? pt denies  ? Urticaria   ? Uterine fibroid   ? ?Past Surgical History:  ?Procedure Laterality Date  ? [place]  ? right  ? BREAST EXCISIONAL BIOPSY Left 2014  ? benign  ? BREAST LUMPECTOMY WITH NEEDLE LOCALIZATION Left 09/09/2013  ? Procedure: BREAST LUMPECTOMY WITH NEEDLE LOCALIZATION;  Surgeon: Imogene Burn. Georgette Dover, MD;  Location: [place];  Service: General;  Laterality: Left;  ? CHOLECYSTECTOMY    ? COLONOSCOPY    ? IR IMAGING GUIDED PORT INSERTION  05/18/2020  ? POLYPECTOMY    ? ROBOTIC ASSISTED LAPAROSCOPIC CHOLECYSTECTOMY  09/09/2019  ? ROBOTIC ASSISTED TOTAL HYSTERECTOMY WITH BILATERAL SALPINGO OOPHERECTOMY N/A 10/22/2019  ? Procedure: XI ROBOTIC ASSISTED TOTAL HYSTERECTOMY WITH BILATERAL SALPINGO OOPHORECTOMY GREATER THAN 250 GRAMS, MINI LAPARTOMY FOR SPECIMEN DELIVERY; PELVIC AND PERI-AORTIC LYMPHADENECTOMY;  Surgeon: Everitt Amber, MD;  Location: [facility];  Service: Gynecology;  Laterality: N/A;  ? SENTINEL NODE BIOPSY N/A 10/22/2019  ? Procedure: SENTINEL NODE BIOPSY;  Surgeon: Everitt Amber, MD;  Location: [facility];  Service: Gynecology;  Laterality: N/A;  ? ? reports that she has never smoked. She has never used smokeless  tobacco. She reports that she does not drink alcohol and does not use drugs. ?family history includes Asthma in her brother and paternal grandmother; Colon polyps (age of onset: 57) in her mother; Congestive Heart Failure in her father; Dementia (age of onset: 43) in her mother; Diabetes in her father and mother; Hypertension in her mother; Pancreatic cancer in her paternal aunt. ?Allergies  ?Allergen Reactions  ? Benicar Hct [Olmesartan Medoxomil-Hctz] Shortness Of Breath and Palpitations  ? Iodinated Contrast Media Hives and Itching  ?  02/02/2021-  pt developed 2 hives and itching even with the 13hr premedication.  Radiology PA came into eval the pt. ? ?Developed  itching and hives after injection on 05/10/20; needs 13hr prep in future  ? Bactrim [Sulfamethoxazole-Trimethoprim] Other (See Comments)  ?  Abdominal pain, dizziness  ? Pravastatin Other (See Comments)  ?  Lower abdominal pain  ? Shellfish Allergy Nausea And Vomiting  ? Amlodipine Palpitations  ? ?Current Outpatient Medications on File Prior to Visit  ?Medication Sig Dispense Refill  ? acetaminophen (TYLENOL) 500 MG tablet Take 1,000 mg by mouth every 6 (six) hours as needed for moderate pain.    ? apixaban (ELIQUIS) 5 MG TABS tablet Take 1 tablet (5 mg total) by mouth 2 (two) times daily. 180 tablet 1  ? APPLE CIDER VINEGAR PO Take 5 mLs by mouth daily.     ? atenolol (TENORMIN) 25 MG tablet TAKE 1 TABLET (25 MG TOTAL) BY MOUTH DAILY. 90 tablet 1  ? Cholecalciferol (VITAMIN D3) 50 MCG (2000 UT) TABS Take by mouth.    ? EPINEPHrine (EPIPEN 2-PAK) 0.3 mg/0.3 mL IJ SOAJ injection Inject 0.3 mg into the muscle as needed for anaphylaxis. 1 each 2  ? levocetirizine (XYZAL) 5 MG tablet Can use up to 2 tablets in the morning and 2 tablets at night for hives/itching. This is maximum dose. Titrate as needed. 120 tablet 3  ? levothyroxine (SYNTHROID) 100 MCG tablet Take 100 mcg by mouth daily before breakfast.    ? Multiple Vitamin (MULTIVITAMIN) capsule Take 1 capsule by mouth daily.    ? ondansetron (ZOFRAN) 4 MG tablet Take 1 tablet (4 mg total) by mouth every 8 (eight) hours as needed for nausea or vomiting. 20 tablet 0  ? polyethylene glycol (MIRALAX / GLYCOLAX) 17 g packet Take 17 g by mouth daily as needed for mild constipation.    ? triamcinolone cream (KENALOG) 0.1 % Apply 1 application topically 2 (two) times daily. 30 g 1  ? ?No current facility-administered medications on file prior to visit.  ? ?     ROS:  All others reviewed and negative. ? ?Objective  ? ?     PE:  BP 122/68 (BP Location: Right Arm, Patient Position: Sitting, Cuff Size: Large)   Pulse 65   Temp 98.5 ?F (36.9 ?C) (Oral)   Ht '5\' 4"'$  (1.626 m)    Wt 206 lb (93.4 kg)   SpO2 100%   BMI 35.36 kg/m?  ? ?              Constitutional: Pt appears in NAD ?              HENT: Head: NCAT.  ?              Right Ear: External ear normal.   ?              Left Ear: External ear normal.  ?  Eyes: . Pupils are equal, round, and reactive to light. Conjunctivae and EOM are normal ?              Nose: without d/c or deformity ?              Neck: Neck supple. Gross normal ROM ?              Cardiovascular: Normal rate and regular rhythm.   ?              Pulmonary/Chest: Effort normal and breath sounds without rales or wheezing.  ?              Abd:  Soft, mld epigastric tender, ND, + BS, no organomegaly ?              Neurological: Pt is alert. At baseline orientation, motor grossly intact ?              Skin: Skin is warm. No rashes, no other new lesions, LE edema - none ?              Psychiatric: Pt behavior is normal without agitation  ? ?Micro: none ? ?Cardiac tracings I have personally interpreted today:  none ? ?Pertinent Radiological findings (summarize): none  ? ?Lab Results  ?Component Value Date  ? WBC 4.0 04/09/2022  ? HGB 12.3 04/09/2022  ? HCT 38.3 04/09/2022  ? PLT 197.0 04/09/2022  ? GLUCOSE 86 04/09/2022  ? CHOL 255 (H) 04/09/2022  ? TRIG 57.0 04/09/2022  ? HDL 84.20 04/09/2022  ? LDLCALC 159 (H) 04/09/2022  ? ALT 10 04/09/2022  ? AST 18 04/09/2022  ? NA 137 04/09/2022  ? K 4.3 04/09/2022  ? CL 101 04/09/2022  ? CREATININE 0.86 04/09/2022  ? BUN 11 04/09/2022  ? CO2 29 04/09/2022  ? TSH 6.63 (H) 04/09/2022  ? INR 1.0 01/28/2021  ? HGBA1C 5.3 02/02/2022  ? ?Assessment/Plan:  ?Tina Stone is a 72 y.o. Black or African American [2] female with  has a past medical history of Adenomatous colon polyp, Allergic rhinitis, seasonal, Allergy, Beta thalassemia trait (11/25/2013), Cholelithiasis, Class 3 obesity without serious comorbidity with body mass index (BMI) of 40.0 to 44.9 in adult (11/19/2012), endometrial ca (dx'd 08/2009), GERD  (gastroesophageal reflux disease), HLD (hyperlipidemia), Hypercholesterolemia, Hypertension (03/18/2017), Intraductal papilloma of left breast, PONV (postoperative nausea and vomiting), Pre-diabetes, Urticaria, and Uterine fib

## 2022-04-10 LAB — H. PYLORI ANTIBODY, IGG: H Pylori IgG: POSITIVE — AB

## 2022-04-11 ENCOUNTER — Other Ambulatory Visit (HOSPITAL_COMMUNITY): Payer: Self-pay

## 2022-04-11 ENCOUNTER — Encounter: Payer: Self-pay | Admitting: Nurse Practitioner

## 2022-04-11 ENCOUNTER — Ambulatory Visit (INDEPENDENT_AMBULATORY_CARE_PROVIDER_SITE_OTHER): Payer: Medicare Other | Admitting: Physician Assistant

## 2022-04-11 ENCOUNTER — Encounter: Payer: Self-pay | Admitting: Physician Assistant

## 2022-04-11 ENCOUNTER — Other Ambulatory Visit: Payer: Self-pay | Admitting: Internal Medicine

## 2022-04-11 ENCOUNTER — Encounter: Payer: Self-pay | Admitting: Hematology and Oncology

## 2022-04-11 ENCOUNTER — Telehealth: Payer: Self-pay | Admitting: Internal Medicine

## 2022-04-11 VITALS — BP 126/84 | HR 65 | Ht 64.8 in | Wt 206.2 lb

## 2022-04-11 DIAGNOSIS — I10 Essential (primary) hypertension: Secondary | ICD-10-CM

## 2022-04-11 DIAGNOSIS — E785 Hyperlipidemia, unspecified: Secondary | ICD-10-CM

## 2022-04-11 DIAGNOSIS — R7303 Prediabetes: Secondary | ICD-10-CM

## 2022-04-11 DIAGNOSIS — I480 Paroxysmal atrial fibrillation: Secondary | ICD-10-CM

## 2022-04-11 DIAGNOSIS — E669 Obesity, unspecified: Secondary | ICD-10-CM

## 2022-04-11 DIAGNOSIS — I700 Atherosclerosis of aorta: Secondary | ICD-10-CM | POA: Diagnosis not present

## 2022-04-11 MED ORDER — PANTOPRAZOLE SODIUM 40 MG PO TBEC
40.0000 mg | DELAYED_RELEASE_TABLET | Freq: Two times a day (BID) | ORAL | 0 refills | Status: DC
Start: 2022-04-11 — End: 2022-04-30
  Filled 2022-04-11 (×2): qty 28, 14d supply, fill #0

## 2022-04-11 MED ORDER — AMOXICILLIN 500 MG PO CAPS
1000.0000 mg | ORAL_CAPSULE | Freq: Two times a day (BID) | ORAL | 0 refills | Status: AC
  Filled 2022-04-11: qty 56, 14d supply, fill #0

## 2022-04-11 MED ORDER — ROSUVASTATIN CALCIUM 10 MG PO TABS: 10.0000 mg | ORAL_TABLET | Freq: Every day | ORAL | 3 refills | Status: DC

## 2022-04-11 MED ORDER — CLARITHROMYCIN 500 MG PO TABS
500.0000 mg | ORAL_TABLET | Freq: Two times a day (BID) | ORAL | 0 refills | Status: DC
  Filled 2022-04-11: qty 28, 14d supply, fill #0

## 2022-04-11 NOTE — Telephone Encounter (Signed)
Called pt back per her request, LVM to discuss.  ?

## 2022-04-11 NOTE — Patient Instructions (Addendum)
Medication Instructions:  ?Your physician has recommended you make the following change in your medication:  ? START Crestor 10 mg taking 1 tablet daily  ? ?*If you need a refill on your cardiac medications before your next appointment, please call your pharmacy* ? ? ?Lab Work: ?07/11/22:  COME ANYTIME AFTER 7:15 A.M. FOR:  FASTING LIPID  ? ?If you have labs (blood work) drawn today and your tests are completely normal, you will receive your results only by: ?MyChart Message (if you have MyChart) OR ?A paper copy in the mail ?If you have any lab test that is abnormal or we need to change your treatment, we will call you to review the results. ? ? ?Testing/Procedures: ?None ordered ? ? ?Follow-Up: ?At [facility], you and your health needs are our priority.  As part of our continuing mission to provide you with exceptional heart care, we have created designated Provider Care Teams.  These Care Teams include your primary Cardiologist (physician) and Advanced Practice Providers (APPs -  Physician Assistants and Nurse Practitioners) who all work together to provide you with the care you need, when you need it. ? ?We recommend signing up for the patient portal called "MyChart".  Sign up information is provided on this After Visit Summary.  MyChart is used to connect with patients for Virtual Visits (Telemedicine).  Patients are able to view lab/test results, encounter notes, upcoming appointments, etc.  Non-urgent messages can be sent to your provider as well.   ?To learn more about what you can do with MyChart, go to [email or web address].   ? ?Your next appointment:   ?6 month(s) ? ?The format for your next appointment:   ?In Person ? ?Provider:   ?Sinclair Grooms, MD   ? ?Other Instructions ?Your physician discussed the importance of regular exercise and recommended that you start or continue a regular exercise program for good health and exercise at least 150 minutes a week. ? ? ?Important Information About  [employer] ? ? ? ? ?  ?

## 2022-04-11 NOTE — Telephone Encounter (Signed)
Pt requesting call back from assistant regarding lab results.  ? ?Believes she may need an antibiotic for a stomach "bug" ?

## 2022-04-11 NOTE — Telephone Encounter (Signed)
LVM stating that an OV with a provider is needed for an antibiotic request. ? ?When pt calls back please schedule. ?

## 2022-04-11 NOTE — Telephone Encounter (Signed)
Pt is already taking an abx.  ? ?Pt states that she was told that she needed to see and GI dr and I gave her the message that was in the notes for the referral and gave her the number. Pt was going to call and get scheduled. ? ?BUT pt is still requesting a call back from [place]. ? ?FYI ?

## 2022-04-13 ENCOUNTER — Telehealth: Payer: Self-pay | Admitting: *Deleted

## 2022-04-13 ENCOUNTER — Other Ambulatory Visit (HOSPITAL_COMMUNITY): Payer: Self-pay

## 2022-04-13 ENCOUNTER — Telehealth: Payer: Self-pay | Admitting: Internal Medicine

## 2022-04-13 NOTE — Telephone Encounter (Signed)
Patient notified of provider's recommendations 

## 2022-04-13 NOTE — Telephone Encounter (Signed)
CALLED PATIENT TO ASK ABOUT RESCHEDULING FU ON 04-23-22 DUE TO DR. KINARD BEING ON VACATION, RESCHEDULED FROM 04-23-22 TO 05-03-22 @ 8:15 AM, PATIENT AGREED TO NEW DAY AND TIME ?

## 2022-04-13 NOTE — Telephone Encounter (Signed)
Pt states since starting amoxicillin (AMOXIL) 500 MG capsule prescribed by Dr. Jenny Reichmann she is experiencing runny stool after every meal ? ?Pt inquiring if that is a normal side effect, pt denies any other symptom ? ?Please advise ? ?Pt requesting a cb w/ provider's response ?

## 2022-04-13 NOTE — Telephone Encounter (Signed)
This is possible but many other things could be as well ? ?Please consider taking OTC probiotic with the antibiotic as this can sometimes help ?

## 2022-04-15 DIAGNOSIS — R1013 Epigastric pain: Secondary | ICD-10-CM | POA: Insufficient documentation

## 2022-04-15 NOTE — Assessment & Plan Note (Signed)
Uncontrolled, for labs including magnesium, protonix 40 qd, and refer GI per pt request ?

## 2022-04-15 NOTE — Assessment & Plan Note (Signed)
. ?  Lab Results  ?Component Value Date  ? HGBA1C 5.3 02/02/2022  ? ?Stable, pt to continue current medical treatment  - diet ? ?

## 2022-04-15 NOTE — Assessment & Plan Note (Signed)
BP Readings from Last 3 Encounters:  ?04/11/22 126/84  ?04/09/22 122/68  ?03/26/22 130/65  ? ?Stable, pt to continue medical treatment tenormin ? ?

## 2022-04-15 NOTE — Assessment & Plan Note (Signed)
Lab Results  ?Component Value Date  ? LDLCALC 159 (H) 04/09/2022  ? ?uncontrolled, pt to start crestor ? ? ?

## 2022-04-15 NOTE — Assessment & Plan Note (Signed)
?  Stable, pt to continue levothyroxine and f/u TFTs today ? ?

## 2022-04-15 NOTE — Assessment & Plan Note (Signed)
Last vitamin D Lab Results  Component Value Date   VD25OH 42.24 04/09/2022   Stable, cont oral replacement  

## 2022-04-20 ENCOUNTER — Telehealth: Payer: Self-pay

## 2022-04-20 NOTE — Telephone Encounter (Signed)
-----   Message from [name], MD sent at 04/20/2022  8:57 AM EDT ----- ?Remind her to call/schedule Ct for 6/21 ? ?

## 2022-04-20 NOTE — Telephone Encounter (Signed)
Called and given below message. She will call and schedule CT. Given radiology scheduling #. ?

## 2022-04-23 ENCOUNTER — Encounter: Payer: Self-pay | Admitting: Radiology

## 2022-04-23 ENCOUNTER — Ambulatory Visit: Payer: Medicare Other | Admitting: Radiation Oncology

## 2022-04-25 ENCOUNTER — Other Ambulatory Visit: Payer: Self-pay | Admitting: Internal Medicine

## 2022-04-25 ENCOUNTER — Other Ambulatory Visit (HOSPITAL_COMMUNITY): Payer: Self-pay

## 2022-04-25 DIAGNOSIS — Z1231 Encounter for screening mammogram for malignant neoplasm of breast: Secondary | ICD-10-CM

## 2022-04-29 NOTE — Progress Notes (Signed)
? ? ? ?04/30/2022 ?Tina Stone ?[id number] ?08-20-1950 ? ? ?CHIEF COMPLAINT: Epigastric pain  ? ?HISTORY OF PRESENT ILLNESS: Tina Stone is a 72 year old female with a past medical history of hypertension, hyperlipidemia, aortic atherosclerosis, paroxysmal atrial fibrillation 01/2021 on Eliquis, pre-diabetes, hypothyroidism, gallstones, beta thalassemia trait, pre diabetes, endometrial cancer with liver metastasis s/p hysterectomy and chemotherapy 10/2019 with radiation 01/2022 secondary to progressive enlargement of a right external iliac lymph node, GERD, diverticulosis and colon polyps. Past cholecystectomy, left breast lumpectomy.  ? ?She developed epigastric pain early April 2024. H. She was seen by her PCP, H. Pylori IgG was positive and she was treated prescribed Amoxicillin, Clarithromycin an Protonix bid x 14 days which she completed on 04/24/2022.  She stated her epigastric pain abated within 1 week after starting the H. pylori treatment.  She does not wish to restart Protonix as it resulted in a sensation of impending itchiness.  She has infrequent heartburn which occurs only if she eats spicy foods or if she does not sleep with the head of the bed elevated.  No dysphagia.  She is passing normal formed brown bowel movement daily.  No rectal bleeding or black stools.  No NSAID use.  She underwent a colonoscopy 10/16/2018 which identified 2 tubular adenomatous polyps which were removed from the ascending colon.  She is due for a recall colonoscopy 10/2023.  No other complaints at this time. ? ?She has a history of endometrial cancer with liver metastasis treated with chemotherapy s/p total hysterectomy.  Surveillance CTAP 02/05/2022 identified a progressive enlargement of an external iliac lymph node on the right suspicious for metastatic disease.  She subsequently underwent palliative radiation therapy.  She is scheduled for follow-up with Dr. Alvy Bimler  on 06/22/2022. ? ? ?  Latest Ref Rng & Units  04/09/2022  ? 10:39 AM 03/26/2022  ?  8:04 AM 02/02/2022  ?  7:33 AM  ?CBC  ?WBC 4.0 - 10.5 K/uL 4.0   3.1   3.7    ?Hemoglobin 12.0 - 15.0 g/dL 12.3   11.3   11.7    ?Hematocrit 36.0 - 46.0 % 38.3   35.0   37.0    ?Platelets 150.0 - 400.0 K/uL 197.0   180   202    ?  ? ?  Latest Ref Rng & Units 04/09/2022  ? 10:39 AM 03/26/2022  ?  8:04 AM 02/02/2022  ?  7:33 AM  ?CMP  ?Glucose 70 - 99 mg/dL 86   118   99    ?BUN 6 - 23 mg/dL '11   11   13    '$ ?Creatinine 0.40 - 1.20 mg/dL 0.86   0.78   0.82    ?Sodium 135 - 145 mEq/L 137   139   138    ?Potassium 3.5 - 5.1 mEq/L 4.3   4.0   4.1    ?Chloride 96 - 112 mEq/L 101   104   103    ?CO2 19 - 32 mEq/L '29   29   27    '$ ?Calcium 8.4 - 10.5 mg/dL 9.7   8.9   9.4    ?Total Protein 6.0 - 8.3 g/dL 7.2   6.5   7.2    ?Total Bilirubin 0.2 - 1.2 mg/dL 0.5   0.5   0.4    ?Alkaline Phos 39 - 117 U/L 94   89   99    ?AST 0 - 37 U/L 18   14  16    ?ALT 0 - 35 U/L '10   8   10    '$ ?  ?  ?CTAP with contrast 02/04/2022: ?1. Progressive enlargement of an external iliac lymph node on the ?right, suspicious for metastatic disease from the patient's ?endometrial cancer. This should be amenable to percutaneous biopsy. ?2. No other evidence of progressive metastatic disease. ?Heterogeneous sclerosis of the proximal right femur is unchanged ?from recent prior studies, presumably treated metastatic disease. ?3. Grossly stable probable cysts within the liver and kidneys. ?4. Distal colonic diverticulosis. ?5.  Aortic Atherosclerosis   ? ?Colonoscopy 10/16/2018 by Dr. Silverio Decamp: ?- One 3 mm polyp in the ascending colon, removed with a cold biopsy forceps. Resected and ?retrieved. ?- One 8 mm polyp in the ascending colon, removed with a cold snare. Resected and retrieved. ?- Diverticulosis in the sigmoid colon and in the descending colon. ?- Internal hemorrhoids  ?- Recall colonoscopy 5 years ?- TUBULAR ADENOMA (TWO). ?- NO HIGH GRADE DYSPLASIA OR MALIGNANCY. ?  ?Past Medical History:  ?Diagnosis Date  ?  Adenomatous colon polyp   ? Allergic rhinitis, seasonal   ? Allergy   ? Atrial fibrillation ([place])   ? Beta thalassemia trait 11/25/2013  ? Cholelithiasis   ? Class 3 obesity without serious comorbidity with body mass index (BMI) of 40.0 to 44.9 in adult 11/19/2012  ? endometrial ca dx'd 08/2009  ? endometrial   ? GERD (gastroesophageal reflux disease)   ? H. pylori infection   ? History of radiation therapy   ? endometrial - 02/19/2022-03/23/2022 Dr Gery Pray  ? HLD (hyperlipidemia)   ? Hypercholesterolemia   ? Hypertension 03/18/2017  ? no meds   ? Hypothyroidism   ? Intraductal papilloma of left breast   ? Patient underwent left needle-localized lumpectomy by Dr. Imogene Burn. Tsuei on 09/09/2013; pathology showed intraductal papilloma with no atypia or malignancy identified.  ? PONV (postoperative nausea and vomiting)   ? Pre-diabetes   ? pt denies  ? Urticaria   ? Uterine fibroid   ? ?Past Surgical History:  ?Procedure Laterality Date  ? ANKLE FRACTURE SURGERY Right 1966  ? right  ? BREAST EXCISIONAL BIOPSY Left 2014  ? benign  ? BREAST LUMPECTOMY WITH NEEDLE LOCALIZATION Left 09/09/2013  ? Procedure: BREAST LUMPECTOMY WITH NEEDLE LOCALIZATION;  Surgeon: Imogene Burn. Georgette Dover, MD;  Location: [place];  Service: General;  Laterality: Left;  ? CHOLECYSTECTOMY    ? COLONOSCOPY    ? IR IMAGING GUIDED PORT INSERTION  05/18/2020  ? POLYPECTOMY    ? ROBOTIC ASSISTED LAPAROSCOPIC CHOLECYSTECTOMY  09/09/2019  ? ROBOTIC ASSISTED TOTAL HYSTERECTOMY WITH BILATERAL SALPINGO OOPHERECTOMY N/A 10/22/2019  ? Procedure: XI ROBOTIC ASSISTED TOTAL HYSTERECTOMY WITH BILATERAL SALPINGO OOPHORECTOMY GREATER THAN 250 GRAMS, MINI LAPARTOMY FOR SPECIMEN DELIVERY; PELVIC AND PERI-AORTIC LYMPHADENECTOMY;  Surgeon: Everitt Amber, MD;  Location: [facility];  Service: Gynecology;  Laterality: N/A;  ? SENTINEL NODE BIOPSY N/A 10/22/2019  ? Procedure: SENTINEL NODE BIOPSY;  Surgeon: Everitt Amber, MD;  Location: [facility];  Service: Gynecology;  Laterality: N/A;   ? ?Social History: Nonsmoker. No alcohol. No drug use.  ? ?Family History: Mother with history of colon polyps, hypertension, dementia, diabetes and a goiter.  Father with history of CHF and diabetes.  Paternal aunt with history of pancreatic cancer.  Brother with asthma.  No known family history of esophageal, gastric or colon cancer. ?  ?Allergies  ?Allergen Reactions  ? Benicar Hct [Olmesartan Medoxomil-Hctz] Shortness Of Breath and Palpitations  ?  Iodinated Contrast Media Hives and Itching  ?  02/02/2021-  pt developed 2 hives and itching even with the 13hr premedication.  Radiology PA came into eval the pt. ? ?Developed itching and hives after injection on 05/10/20; needs 13hr prep in future  ? Bactrim [Sulfamethoxazole-Trimethoprim] Other (See Comments)  ?  Abdominal pain, dizziness  ? Pravachol [Pravastatin] Other (See Comments)  ?  Lower abdominal pain  ? Shellfish Allergy Nausea And Vomiting  ? Amlodipine Palpitations  ? ? ?  ?Outpatient Encounter Medications as of 04/30/2022  ?Medication Sig  ? acetaminophen (TYLENOL) 500 MG tablet Take 1,000 mg by mouth every 6 (six) hours as needed for moderate pain.  ? apixaban (ELIQUIS) 5 MG TABS tablet Take 1 tablet (5 mg total) by mouth 2 (two) times daily.  ? APPLE CIDER VINEGAR PO Take 5 mLs by mouth daily.   ? atenolol (TENORMIN) 25 MG tablet TAKE 1 TABLET (25 MG TOTAL) BY MOUTH DAILY.  ? Cholecalciferol (VITAMIN D3) 50 MCG (2000 UT) TABS Take by mouth.  ? levocetirizine (XYZAL) 5 MG tablet Can use up to 2 tablets in the morning and 2 tablets at night for hives/itching. This is maximum dose. Titrate as needed.  ? levothyroxine (SYNTHROID) 100 MCG tablet Take 100 mcg by mouth daily before breakfast.  ? Multiple Vitamin (MULTIVITAMIN) capsule Take 1 capsule by mouth daily.  ? rosuvastatin (CRESTOR) 10 MG tablet Take 1 tablet (10 mg total) by mouth daily.  ? EPINEPHrine (EPIPEN 2-PAK) 0.3 mg/0.3 mL IJ SOAJ injection Inject 0.3 mg into the muscle as needed for  anaphylaxis. (Patient not taking: Reported on 04/30/2022)  ? ondansetron (ZOFRAN) 4 MG tablet Take 1 tablet (4 mg total) by mouth every 8 (eight) hours as needed for nausea or vomiting. (Patient not taking: Reported on 04/30/2022)  ?

## 2022-04-30 ENCOUNTER — Other Ambulatory Visit: Payer: Medicare Other

## 2022-04-30 ENCOUNTER — Encounter: Payer: Self-pay | Admitting: Nurse Practitioner

## 2022-04-30 ENCOUNTER — Ambulatory Visit (INDEPENDENT_AMBULATORY_CARE_PROVIDER_SITE_OTHER): Payer: Medicare Other | Admitting: Nurse Practitioner

## 2022-04-30 VITALS — BP 118/78 | HR 65 | Ht 62.0 in | Wt 208.4 lb

## 2022-04-30 DIAGNOSIS — Z860100 Personal history of colon polyps, unspecified: Secondary | ICD-10-CM

## 2022-04-30 DIAGNOSIS — K219 Gastro-esophageal reflux disease without esophagitis: Secondary | ICD-10-CM | POA: Diagnosis not present

## 2022-04-30 DIAGNOSIS — A048 Other specified bacterial intestinal infections: Secondary | ICD-10-CM | POA: Diagnosis not present

## 2022-04-30 NOTE — Patient Instructions (Signed)
Please stop at the lab to pick up the H.pylori stool test. Please complete the test in 4 weeks from now. ? ?Please no not take Protonix or any other acid reducing medications until you have completed the test. ? ?Your next colonoscopy is due October 2024. ? ?Thank you for trusting me with your gastrointestinal care!   ? ?Noralyn Pick, CRNP ? ? ? ?BMI: ? ?If you are age 70 or older, your body mass index should be between 23-30. Your Body mass index is 38.11 kg/m?Marland Kitchen If this is out of the aforementioned range listed, please consider follow up with your Primary Care Provider. ? ?If you are age 70 or younger, your body mass index should be between 19-25. Your Body mass index is 38.11 kg/m?Marland Kitchen If this is out of the aformentioned range listed, please consider follow up with your Primary Care Provider.  ? ?MY CHART: ? ?The [place] [facility] providers would like to encourage you to use [facility] to communicate with providers for non-urgent requests or questions.  Due to long hold times on the telephone, sending your provider a message by [facility] may be a faster and more efficient way to get a response.  Please allow 48 business hours for a response.  Please remember that this is for non-urgent requests.  ? ?

## 2022-05-02 NOTE — Progress Notes (Signed)
?Radiation Oncology         [phone]) [phone] ?________________________________ ? ?Name: Tina Stone MRN: [id number]  ?Date: 05/03/2022  DOB: 11/24/1950 ? ?Follow-Up Visit Note ? ?CC: Tina Lima, MD  Tina Lark, MD ? ?  ICD-10-CM   ?1. Endometrial cancer ([place])  C54.1   ?  ?2. Metastasis to liver [facility])  C78.7   ?  ? ? ?Diagnosis:  The primary encounter diagnosis was Endometrial cancer ([place]). A diagnosis of Metastasis to liver [facility]) was also pertinent to this visit. ?  ?History of Stage IVB Endometrial Cancer with liver metastasis, s/p chemotherapy, hysterectomy and BSO; now with recent evidence of metastatic disease to a right iliac lymph node ? ? Cancer Staging  ?Endometrial cancer ([place]) ?Staging form: Corpus Uteri - Carcinoma and Carcinosarcoma, AJCC 8th Edition ?- Pathologic: Stage IVB (pT1a, pN0, cM1) - Signed by Tina Lark, MD on 05/16/2020 ? ? ?Interval Since Last Radiation: 1 month and 10 days  ? ?Intent: Palliative ? ?Radiation Treatment Dates: 02/19/2022 through 03/23/2022 ?Site Technique Total Dose (Gy) Dose per Fx (Gy) Completed Fx Beam Energies  ?Internal Iliac Nodes: Pelvis IMRT 50/50 2 25/25 6X  ? ? ?Narrative:  The patient returns today for routine follow-up. The patient tolerated radiation therapy relatively well. During her final weekly treatment check on 03/21/22, the patient reported pain to bilateral lower extremities at times, soft stools, urinary frequency and urgency.    ? ?Since completing RT, the patient followed up with Dr. Alvy Stone on 03/26/22. During which time, the patient denied any abdominal pain or changes in bowel habits.  ? ?The patient also followed up with Dr. Jenny Stone, internal med, for evaluation of epigastric pain x 1 month. Labs performed were positive for H. Pylori IgG and she was prescribed Amoxicillin, Clarithromycin an Protonix bid x 14 days which she completed on 04/24/2022. The patient requested a referral to Stone for further evaluation and accordingly met with Tina Stone  on 04/30/22. During this visit, the patient reported a decrease in epigastric pain within 1 week after starting H. pylori treatment. To further assess for H pylori eradication and rule out any evidence of upper Stone malignancy, the patient was recommended to undergo an EGD, though she is hesitant to pursue this. The patient will return to Stone in 4 weeks for stool check to assess for H. Pylori eradication.  ? ?Pertinent imaging performed in the interval since her initial consultation includes:  ?-- MRI of the brain on 03/12/22 (for evaluation of headache) which showed no evidence of intracranial metastatic disease. (MRI did reveal some mild chronic microvascular ischemic changes of white matter, as well as a remote lacunar infarct of the left caudate head).  ? ?She reports no nausea or diarrhea since completion of her radiation therapy.  She denies any pain in the pelvis or lower abdominal region ?                            ? ?Allergies:  is allergic to benicar hct [olmesartan medoxomil-hctz], iodinated contrast media, bactrim [sulfamethoxazole-trimethoprim], pravachol [pravastatin], shellfish allergy, and amlodipine. ? ?Meds: ?Current Outpatient Medications  ?Medication Sig Dispense Refill  ? acetaminophen (TYLENOL) 500 MG tablet Take 1,000 mg by mouth every 6 (six) hours as needed for moderate pain.    ? apixaban (ELIQUIS) 5 MG TABS tablet Take 1 tablet (5 mg total) by mouth 2 (two) times daily. 180 tablet 1  ? APPLE CIDER VINEGAR PO Take  5 mLs by mouth daily.     ? atenolol (TENORMIN) 25 MG tablet TAKE 1 TABLET (25 MG TOTAL) BY MOUTH DAILY. 90 tablet 1  ? Cholecalciferol (VITAMIN D3) 50 MCG (2000 UT) TABS Take by mouth.    ? EPINEPHrine (EPIPEN 2-PAK) 0.3 mg/0.3 mL IJ SOAJ injection Inject 0.3 mg into the muscle as needed for anaphylaxis. 1 each 2  ? levocetirizine (XYZAL) 5 MG tablet Can use up to 2 tablets in the morning and 2 tablets at night for hives/itching. This is maximum dose. Titrate as needed. 120 tablet  3  ? levothyroxine (SYNTHROID) 100 MCG tablet Take 100 mcg by mouth daily before breakfast.    ? Multiple Vitamin (MULTIVITAMIN) capsule Take 1 capsule by mouth daily.    ? ondansetron (ZOFRAN) 4 MG tablet Take 1 tablet (4 mg total) by mouth every 8 (eight) hours as needed for nausea or vomiting. 20 tablet 0  ? rosuvastatin (CRESTOR) 10 MG tablet Take 1 tablet (10 mg total) by mouth daily. 90 tablet 3  ? ?No current facility-administered medications for this encounter.  ? ? ?Physical Findings: ?The patient is in no acute distress. Patient is alert and oriented. ? height is '5\' 2"'  (1.575 m) and weight is 209 lb 4 oz (94.9 kg). Her temporal temperature is 97 ?F (36.1 ?C) (abnormal). Her blood pressure is 141/87 (abnormal) and her pulse is 65. Her respiration is 18 and oxygen saturation is 100%. .  No significant changes. Lungs are clear to auscultation bilaterally. Heart has regular rate and rhythm. No palpable cervical, supraclavicular, or axillary adenopathy. Abdomen soft, non-tender, normal bowel sounds.  ? ? ?Lab Findings: ?Lab Results  ?Component Value Date  ? WBC 4.0 04/09/2022  ? HGB 12.3 04/09/2022  ? HCT 38.3 04/09/2022  ? MCV 76.2 (L) 04/09/2022  ? PLT 197.0 04/09/2022  ? ? ?Radiographic Findings: ?No results found. ? ?Impression:  The primary encounter diagnosis was Endometrial cancer ([place]). A diagnosis of Metastasis to liver [facility]) was also pertinent to this visit. ?  ?History of Stage IVB Endometrial Cancer with liver metastasis, s/p chemotherapy, hysterectomy and BSO; now with recent evidence of metastatic disease to a right iliac lymph node ? ?She tolerated her radiation therapy quite well and denies any residual effects from this treatment. ? ?Plan: As needed follow-up in radiation oncology.  She will undergo a CT scan of the abdomen and pelvis in June.  She will follow-up with Dr. Alvy Stone to review these studies. ? ? ? ?____________________________________ ? ?Tina Promise, PhD, MD ? ?This document  serves as a record of services personally performed by Gery Pray, MD. It was created on his behalf by Roney Mans, a trained medical scribe. The creation of this record is based on the scribe's personal observations and the provider's statements to them. This document has been checked and approved by the attending provider. ? ?

## 2022-05-02 NOTE — Progress Notes (Incomplete)
?  Radiation Oncology         [phone] ?________________________________ ? ?Patient Name: Tina Stone ?MRN: [id number] ?DOB: 09/09/1950 ?Referring Physician: [facility] (Profile Not Attached) ?Date of Service: 03/23/2022 ?[facility] [place] ? ?                                                      End Of Treatment Note ? ?Diagnoses: C54.1-Malignant neoplasm of endometrium ?C78.7-Secondary malignant neoplasm of liver and intrahepatic bile duct ? ?Cancer Staging: History of Stage IVB Endometrial Cancer with liver metastasis, s/p chemotherapy, hysterectomy and BSO; now with recent evidence of metastatic disease to a right iliac lymph node ? ? Cancer Staging  ?Endometrial cancer ([place]) ?Staging form: Corpus Uteri - Carcinoma and Carcinosarcoma, AJCC 8th Edition ?- Pathologic: Stage IVB (pT1a, pN0, cM1) - Signed by [name], MD on 05/16/2020 ? ? ?Intent: Palliative ? ?Radiation Treatment Dates: 02/19/2022 through 03/23/2022 ?Site Technique Total Dose (Gy) Dose per Fx (Gy) Completed Fx Beam Energies  ?Internal Iliac Nodes: Pelvis IMRT 50/50 2 25/25 6X  ? ?Narrative: The patient tolerated radiation therapy relatively well. During her final weekly treatment check on 03/21/22, the patient reported pain to bilateral lower extremities at times, soft stools, urinary frequency and urgency.  ? ?Plan: The patient will follow-up with radiation oncology in one month . ? ?________________________________________________ ?----------------------------------- ? ?Blair Promise, PhD, MD ? ?This document serves as a record of services personally performed by Gery Pray, MD. It was created on his behalf by Roney Mans, a trained medical scribe. The creation of this record is based on the scribe's personal observations and the provider's statements to them. This document has been checked and approved by the attending provider. ? ?

## 2022-05-03 ENCOUNTER — Ambulatory Visit
Admission: RE | Admit: 2022-05-03 | Discharge: 2022-05-03 | Disposition: A | Discharge status: Home / Self Care | Payer: Medicare Other | Source: Ambulatory Visit | Attending: Radiation Oncology | Admitting: Radiation Oncology

## 2022-05-03 ENCOUNTER — Encounter: Payer: Self-pay | Admitting: Radiation Oncology

## 2022-05-03 ENCOUNTER — Other Ambulatory Visit: Payer: Self-pay

## 2022-05-03 VITALS — BP 141/87 | HR 65 | Temp 97.0°F | Resp 18 | Ht 62.0 in | Wt 209.2 lb

## 2022-05-03 DIAGNOSIS — C787 Secondary malignant neoplasm of liver and intrahepatic bile duct: Secondary | ICD-10-CM | POA: Diagnosis not present

## 2022-05-03 DIAGNOSIS — C541 Malignant neoplasm of endometrium: Secondary | ICD-10-CM | POA: Insufficient documentation

## 2022-05-03 NOTE — Progress Notes (Signed)
Tina Stone is here today for follow up post radiation to the pelvic. ? ?They completed their radiation on: 03/23/2022  ? ?Does the patient complain of any of the following: ? ?Pain:discomfort from gas ?Abdominal bloating: denies ?Diarrhea/Constipation: denies ?Nausea/Vomiting: denies ?Vaginal Discharge: denies ?Blood in Urine or Stool: denies ?Urinary Issues (dysuria/incomplete emptying/ incontinence/ increased frequency/urgency): nocturia x0-2 ?Does patient report using vaginal dilator 2-3 times a week and/or sexually active 2-3 weeks: n/a ?Post radiation skin changes: denies ? ? ?Additional comments if applicable:  nothing of note ? ?Vitals:  ? 05/03/22 0824  ?BP: (!) 141/87  ?Pulse: 65  ?Resp: 18  ?Temp: (!) 97 ?F (36.1 ?C)  ?TempSrc: Temporal  ?SpO2: 100%  ?Weight: 209 lb 4 oz (94.9 kg)  ?Height: '5\' 2"'$  (1.575 m)  ? ? ?

## 2022-05-15 ENCOUNTER — Ambulatory Visit (INDEPENDENT_AMBULATORY_CARE_PROVIDER_SITE_OTHER): Payer: Medicare Other

## 2022-05-15 DIAGNOSIS — Z0000 Encounter for general adult medical examination without abnormal findings: Secondary | ICD-10-CM

## 2022-05-15 NOTE — Patient Instructions (Signed)
Tina Stone , ?Thank you for taking time to come for your Medicare Wellness Visit. I appreciate your ongoing commitment to your health goals. Please review the following plan we discussed and let me know if I can assist you in the future.  ? ?Screening recommendations/referrals: ?Colonoscopy: 10/06/2018; due every 5 years ?Mammogram: 08/21/2021, due every year ?Bone Density: 10/22/2018; due every 5 years ?Recommended yearly ophthalmology/optometry visit for glaucoma screening and checkup ?Recommended yearly dental visit for hygiene and checkup ? ?Vaccinations: ?Influenza vaccine: 09/29/2021 ?Pneumococcal vaccine: 05/12/2018, 09/16/2019 ?Tdap vaccine: 08/04/2013; due every 10 years ?Shingles vaccine: 05/01/2021, 07/10/2021   ?Covid-19: 03/31/2020, 04/25/2020, 09/01/2020, 08/01/2021, 11/20/2021 ? ?Advanced directives: Advance directive discussed with you today. Even though you declined this today please call our office should you change your mind and we can give you the proper paperwork for you to fill out. ? ?Conditions/risks identified: Yes; Medication management ? ?Next appointment: Please schedule your next Medicare Wellness Visit with your Nurse Health Advisor in 1 year by calling [phone]. ? ? ?Preventive Care 70 Years and Older, Female ?Preventive care refers to lifestyle choices and visits with your health care provider that can promote health and wellness. ?What does preventive care include? ?A yearly physical exam. This is also called an annual well check. ?Dental exams once or twice a year. ?Routine eye exams. Ask your health care provider how often you should have your eyes checked. ?Personal lifestyle choices, including: ?Daily care of your teeth and gums. ?Regular physical activity. ?Eating a healthy diet. ?Avoiding tobacco and drug use. ?Limiting alcohol use. ?Practicing safe sex. ?Taking low-dose aspirin every day. ?Taking vitamin and mineral supplements as recommended by your health care provider. ?What happens  during an annual well check? ?The services and screenings done by your health care provider during your annual well check will depend on your age, overall health, lifestyle risk factors, and family history of disease. ?Counseling  ?Your health care provider may ask you questions about your: ?Alcohol use. ?Tobacco use. ?Drug use. ?Emotional well-being. ?Home and relationship well-being. ?Sexual activity. ?Eating habits. ?History of falls. ?Memory and ability to understand (cognition). ?Work and work Statistician. ?Reproductive health. ?Screening  ?You may have the following tests or measurements: ?Height, weight, and BMI. ?Blood pressure. ?Lipid and cholesterol levels. These may be checked every 5 years, or more frequently if you are over 19 years old. ?Skin check. ?Lung cancer screening. You may have this screening every year starting at age 49 if you have a 30-pack-year history of smoking and currently smoke or have quit within the past 15 years. ?Fecal occult blood test (FOBT) of the stool. You may have this test every year starting at age 12. ?Flexible sigmoidoscopy or colonoscopy. You may have a sigmoidoscopy every 5 years or a colonoscopy every 10 years starting at age 39. ?Hepatitis C blood test. ?Hepatitis B blood test. ?Sexually transmitted disease (STD) testing. ?Diabetes screening. This is done by checking your blood sugar (glucose) after you have not eaten for a while (fasting). You may have this done every 1-3 years. ?Bone density scan. This is done to screen for osteoporosis. You may have this done starting at age 54. ?Mammogram. This may be done every 1-2 years. Talk to your health care provider about how often you should have regular mammograms. ?Talk with your health care provider about your test results, treatment options, and if necessary, the need for more tests. ?Vaccines  ?Your health care provider may recommend certain vaccines, such as: ?Influenza vaccine. This is  recommended every  year. ?Tetanus, diphtheria, and acellular pertussis (Tdap, Td) vaccine. You may need a Td booster every 10 years. ?Zoster vaccine. You may need this after age 50. ?Pneumococcal 13-valent conjugate (PCV13) vaccine. One dose is recommended after age 2. ?Pneumococcal polysaccharide (PPSV23) vaccine. One dose is recommended after age 40. ?Talk to your health care provider about which screenings and vaccines you need and how often you need them. ?This information is not intended to replace advice given to you by your health care provider. Make sure you discuss any questions you have with your health care provider. ?Document Released: 01/13/2016 Document Revised: 09/05/2016 Document Reviewed: 10/18/2015 ?Elsevier Interactive Patient Education ? 2017 [place]. ? ?Fall Prevention in the Home ?Falls can cause injuries. They can happen to people of all ages. There are many things you can do to make your home safe and to help prevent falls. ?What can I do on the outside of my home? ?Regularly fix the edges of walkways and driveways and fix any cracks. ?Remove anything that might make you trip as you walk through a door, such as a raised step or threshold. ?Trim any bushes or trees on the path to your home. ?Use bright outdoor lighting. ?Clear any walking paths of anything that might make someone trip, such as rocks or tools. ?Regularly check to see if handrails are loose or broken. Make sure that both sides of any steps have handrails. ?Any raised decks and porches should have guardrails on the edges. ?Have any leaves, snow, or ice cleared regularly. ?Use sand or salt on walking paths during winter. ?Clean up any spills in your garage right away. This includes oil or grease spills. ?What can I do in the bathroom? ?Use night lights. ?Install grab bars by the toilet and in the tub and shower. Do not use towel bars as grab bars. ?Use non-skid mats or decals in the tub or shower. ?If you need to sit down in the shower, use a  plastic, non-slip stool. ?Keep the floor dry. Clean up any water that spills on the floor as soon as it happens. ?Remove soap buildup in the tub or shower regularly. ?Attach bath mats securely with double-sided non-slip rug tape. ?Do not have throw rugs and other things on the floor that can make you trip. ?What can I do in the bedroom? ?Use night lights. ?Make sure that you have a light by your bed that is easy to reach. ?Do not use any sheets or blankets that are too big for your bed. They should not hang down onto the floor. ?Have a firm chair that has side arms. You can use this for support while you get dressed. ?Do not have throw rugs and other things on the floor that can make you trip. ?What can I do in the kitchen? ?Clean up any spills right away. ?Avoid walking on wet floors. ?Keep items that you use a lot in easy-to-reach places. ?If you need to reach something above you, use a strong step stool that has a grab bar. ?Keep electrical cords out of the way. ?Do not use floor polish or wax that makes floors slippery. If you must use wax, use non-skid floor wax. ?Do not have throw rugs and other things on the floor that can make you trip. ?What can I do with my stairs? ?Do not leave any items on the stairs. ?Make sure that there are handrails on both sides of the stairs and use them. Fix handrails that are  broken or loose. Make sure that handrails are as long as the stairways. ?Check any carpeting to make sure that it is firmly attached to the stairs. Fix any carpet that is loose or worn. ?Avoid having throw rugs at the top or bottom of the stairs. If you do have throw rugs, attach them to the floor with carpet tape. ?Make sure that you have a light switch at the top of the stairs and the bottom of the stairs. If you do not have them, ask someone to add them for you. ?What else can I do to help prevent falls? ?Wear shoes that: ?Do not have high heels. ?Have rubber bottoms. ?Are comfortable and fit you  well. ?Are closed at the toe. Do not wear sandals. ?If you use a stepladder: ?Make sure that it is fully opened. Do not climb a closed stepladder. ?Make sure that both sides of the stepladder are locked into place. ?Ask some

## 2022-05-15 NOTE — Progress Notes (Signed)
?I connected with Tina Stone today by telephone and verified that I am speaking with the correct person using two identifiers. ?Location patient: home ?Location provider: work ?Persons participating in the virtual visit: patient, provider. ?  ?I discussed the limitations, risks, security and privacy concerns of performing an evaluation and management service by telephone and the availability of in person appointments. I also discussed with the patient that there may be a patient responsible charge related to this service. The patient expressed understanding and verbally consented to this telephonic visit.  ?  ?Interactive audio and video telecommunications were attempted between this provider and patient, however failed, due to patient having technical difficulties OR patient did not have access to video capability.  We continued and completed visit with audio only. ? ?Some vital signs may be absent or patient reported.  ? ?Time Spent with patient on telephone encounter: 30 minutes ? ?Subjective:  ? Tina Stone is a 72 y.o. female who presents for Medicare Annual (Subsequent) preventive examination. ? ?Review of Systems    ? ?Cardiac Risk Factors include: advanced age (>1mn, >>47women);dyslipidemia;family history of premature cardiovascular disease;obesity (BMI >30kg/m2) ? ?   ?Objective:  ?  ?Today's Vitals  ? 05/15/22 1525  ?PainSc: 0-No pain  ? ?There is no height or weight on file to calculate BMI. ? ? ?  05/15/2022  ?  3:29 PM 05/03/2022  ?  8:27 AM 02/07/2022  ?  7:56 AM 10/10/2021  ?  4:07 AM 04/13/2021  ?  2:46 PM 01/11/2021  ?  2:07 AM 01/04/2021  ? 10:24 AM  ?Advanced Directives  ?Does Patient Have a Medical Advance Directive? No No No No No No No  ?Would patient like information on creating a medical advance directive? No - Patient declined No - Patient declined No - Patient declined   No - Patient declined No - Patient declined  ? ? ?Current Medications (verified) ?Outpatient Encounter Medications as  of 05/15/2022  ?Medication Sig  ? acetaminophen (TYLENOL) 500 MG tablet Take 1,000 mg by mouth every 6 (six) hours as needed for moderate pain.  ? apixaban (ELIQUIS) 5 MG TABS tablet Take 1 tablet (5 mg total) by mouth 2 (two) times daily.  ? APPLE CIDER VINEGAR PO Take 5 mLs by mouth daily.   ? atenolol (TENORMIN) 25 MG tablet TAKE 1 TABLET (25 MG TOTAL) BY MOUTH DAILY.  ? Cholecalciferol (VITAMIN D3) 50 MCG (2000 UT) TABS Take by mouth.  ? EPINEPHrine (EPIPEN 2-PAK) 0.3 mg/0.3 mL IJ SOAJ injection Inject 0.3 mg into the muscle as needed for anaphylaxis.  ? levocetirizine (XYZAL) 5 MG tablet Can use up to 2 tablets in the morning and 2 tablets at night for hives/itching. This is maximum dose. Titrate as needed.  ? levothyroxine (SYNTHROID) 100 MCG tablet Take 100 mcg by mouth daily before breakfast.  ? Multiple Vitamin (MULTIVITAMIN) capsule Take 1 capsule by mouth daily.  ? ondansetron (ZOFRAN) 4 MG tablet Take 1 tablet (4 mg total) by mouth every 8 (eight) hours as needed for nausea or vomiting.  ? rosuvastatin (CRESTOR) 10 MG tablet Take 1 tablet (10 mg total) by mouth daily. (Patient not taking: Reported on 05/15/2022)  ? ?No facility-administered encounter medications on file as of 05/15/2022.  ? ? ?Allergies (verified) ?Benicar hct [olmesartan medoxomil-hctz], Iodinated contrast media, Bactrim [sulfamethoxazole-trimethoprim], Pravachol [pravastatin], Shellfish allergy, and Amlodipine  ? ?History: ?Past Medical History:  ?Diagnosis Date  ? Adenomatous colon polyp   ? Allergic rhinitis, seasonal   ?  Allergy   ? Atrial fibrillation ([place])   ? Beta thalassemia trait 11/25/2013  ? Cholelithiasis   ? Class 3 obesity without serious comorbidity with body mass index (BMI) of 40.0 to 44.9 in adult 11/19/2012  ? endometrial ca dx'd 08/2009  ? endometrial   ? GERD (gastroesophageal reflux disease)   ? H. pylori infection   ? History of radiation therapy   ? endometrial - 02/19/2022-03/23/2022 Dr Gery Pray  ? HLD  (hyperlipidemia)   ? Hypercholesterolemia   ? Hypertension 03/18/2017  ? no meds   ? Hypothyroidism   ? Intraductal papilloma of left breast   ? Patient underwent left needle-localized lumpectomy by Dr. Imogene Burn. Tsuei on 09/09/2013; pathology showed intraductal papilloma with no atypia or malignancy identified.  ? PONV (postoperative nausea and vomiting)   ? Pre-diabetes   ? pt denies  ? Urticaria   ? Uterine fibroid   ? ?Past Surgical History:  ?Procedure Laterality Date  ? ANKLE FRACTURE SURGERY Right 1966  ? right  ? BREAST EXCISIONAL BIOPSY Left 2014  ? benign  ? BREAST LUMPECTOMY WITH NEEDLE LOCALIZATION Left 09/09/2013  ? Procedure: BREAST LUMPECTOMY WITH NEEDLE LOCALIZATION;  Surgeon: Imogene Burn. Georgette Dover, MD;  Location: [facility];  Service: General;  Laterality: Left;  ? CHOLECYSTECTOMY    ? COLONOSCOPY    ? IR IMAGING GUIDED PORT INSERTION  05/18/2020  ? POLYPECTOMY    ? ROBOTIC ASSISTED LAPAROSCOPIC CHOLECYSTECTOMY  09/09/2019  ? ROBOTIC ASSISTED TOTAL HYSTERECTOMY WITH BILATERAL SALPINGO OOPHERECTOMY N/A 10/22/2019  ? Procedure: XI ROBOTIC ASSISTED TOTAL HYSTERECTOMY WITH BILATERAL SALPINGO OOPHORECTOMY GREATER THAN 250 GRAMS, MINI LAPARTOMY FOR SPECIMEN DELIVERY; PELVIC AND PERI-AORTIC LYMPHADENECTOMY;  Surgeon: Everitt Amber, MD;  Location: [facility];  Service: Gynecology;  Laterality: N/A;  ? SENTINEL NODE BIOPSY N/A 10/22/2019  ? Procedure: SENTINEL NODE BIOPSY;  Surgeon: Everitt Amber, MD;  Location: [facility];  Service: Gynecology;  Laterality: N/A;  ? ?Family History  ?Problem Relation Age of Onset  ? Diabetes Mother   ? Hypertension Mother   ? Colon polyps Mother 70  ? Dementia Mother 22  ? Diabetes Father   ? Congestive Heart Failure Father   ? Asthma Brother   ? Diabetes Brother   ? Hypertension Brother   ? Hypertension Brother   ? Hypertension Maternal Grandmother   ? Diabetes Maternal Grandmother   ? Asthma Paternal Grandmother   ? Lung disease Paternal Grandmother   ? Diabetes Paternal Grandfather   ?  Pancreatic cancer Paternal Aunt   ? Uterine cancer Paternal Aunt   ? Colon cancer Neg Hx   ? Breast cancer Neg Hx   ? Lung cancer Neg Hx   ? Esophageal cancer Neg Hx   ? Rectal cancer Neg Hx   ? Stomach cancer Neg Hx   ? ?Social History  ? ?Socioeconomic History  ? Marital status: Widowed  ?  Spouse name: Not on file  ? Number of children: 0  ? Years of education: Not on file  ? Highest education level: Not on file  ?Occupational History  ? Occupation: Lawyer  ?Tobacco Use  ? Smoking status: Never  ? Smokeless tobacco: Never  ? Tobacco comments:  ?  few puffs but not a true smoker quit many yrs ago  ?Vaping Use  ? Vaping Use: Never used  ?Substance and Sexual Activity  ? Alcohol use: Never  ? Drug use: Never  ? Sexual activity: Not Currently  ?Other Topics Concern  ? Not on  file  ?Social History Narrative  ? ** Merged History Encounter **  ?    ? Lives in [place], widowed 2003  ? 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   ?   ? ?Social Determinants of Health  ? ?Financial Resource Strain: Low Risk   ? Difficulty of Paying Living Expenses: Not hard at all  ?Food Insecurity: No Food Insecurity  ? Worried About Charity fundraiser in the Last Year: Never true  ? Ran Out of Food in the Last Year: Never true  ?Transportation Needs: No Transportation Needs  ? Lack of Transportation (Medical): No  ? Lack of Transportation (Non-Medical): No  ?Physical Activity: Sufficiently Active  ? Days of Exercise per Week: 5 days  ? Minutes of Exercise per Session: 60 min  ?Stress: No Stress Concern Present  ? Feeling of Stress : Not at all  ?Social Connections: Moderately Integrated  ? Frequency of Communication with Friends and Family: More than three times a week  ? Frequency of Social Gatherings with Friends and Family: More than three times a week  ? Attends Religious Services: More than 4 times per year  ? Active Member of Clubs or Organizations: Yes  ? Attends Archivist Meetings: More than 4 times per year  ?  Marital Status: Widowed  ? ? ?Tobacco Counseling ?Counseling given: Not Answered ?Tobacco comments: few puffs but not a true smoker quit many yrs ago ? ? ?Clinical Intake: ? ?Pre-visit preparation completed: Yes ? ?Pa

## 2022-05-21 ENCOUNTER — Other Ambulatory Visit: Payer: Self-pay

## 2022-05-21 ENCOUNTER — Inpatient Hospital Stay: Payer: Medicare Other | Attending: Gynecologic Oncology

## 2022-05-21 DIAGNOSIS — Z452 Encounter for adjustment and management of vascular access device: Secondary | ICD-10-CM | POA: Diagnosis not present

## 2022-05-21 DIAGNOSIS — C541 Malignant neoplasm of endometrium: Secondary | ICD-10-CM | POA: Diagnosis present

## 2022-05-21 DIAGNOSIS — R590 Localized enlarged lymph nodes: Secondary | ICD-10-CM | POA: Diagnosis not present

## 2022-05-21 DIAGNOSIS — Z7189 Other specified counseling: Secondary | ICD-10-CM

## 2022-05-21 MED ORDER — SODIUM CHLORIDE 0.9% FLUSH
10.0000 mL | Freq: Once | INTRAVENOUS | Status: AC
  Administered 2022-05-21: 10 mL

## 2022-05-21 MED ORDER — HEPARIN SOD (PORK) LOCK FLUSH 100 UNIT/ML IV SOLN
500.0000 [IU] | Freq: Once | INTRAVENOUS | Status: AC
  Administered 2022-05-21: 500 [IU]

## 2022-05-24 NOTE — Progress Notes (Signed)
Agree with EGD for further evaluation of epigastric pain to exclude gastroduodenal ulcer and confirm H.pylori eradication Reviewed and agree with documentation and assessment and plan. Damaris Hippo , MD

## 2022-05-29 ENCOUNTER — Other Ambulatory Visit: Payer: Medicare Other

## 2022-05-29 DIAGNOSIS — A048 Other specified bacterial intestinal infections: Secondary | ICD-10-CM

## 2022-05-29 NOTE — Progress Notes (Signed)
Tina Stone, pls contact patient and let her know Dr. Silverio Decamp recommended for her to schedule a repeat EGD. Schedule her for an EGD if patient is willing. If she does not wish to pursue an EGD scheduled her for a follow up appt with Dr. Silverio Decamp in 6 weeks. Thx.

## 2022-05-29 NOTE — Progress Notes (Signed)
At Dr. Woodward Ku and Carl Best, [facility] request, called pt to schedule procedure. LVM requesting returned call.

## 2022-05-30 LAB — HELICOBACTER PYLORI  SPECIAL ANTIGEN
MICRO NUMBER:: 13458406
SPECIMEN QUALITY: ADEQUATE

## 2022-05-30 NOTE — Progress Notes (Signed)
SECOND ATTEMPT:  At Dr. Woodward Ku and Carl Best, [facility] request, called pt to schedule EGD. Pt declined to schedule at this time. States she would prefer to schedule a f/u appt to further discuss with Dr. Silverio Decamp. States she is currently driving and does not have her schedule readily available. Asked to call back to schedule f/u appt when she has her schedule and safe to do so. Expressed understanding. Will await her returned call.

## 2022-05-30 NOTE — Progress Notes (Signed)
Pt called back and has been scheduled for f/u with Dr. Silverio Decamp on 07/13/22 @ 1120am.

## 2022-06-20 ENCOUNTER — Encounter (HOSPITAL_COMMUNITY): Payer: Self-pay

## 2022-06-20 ENCOUNTER — Other Ambulatory Visit: Payer: Self-pay

## 2022-06-20 ENCOUNTER — Ambulatory Visit (HOSPITAL_COMMUNITY)
Admission: RE | Admit: 2022-06-20 | Discharge: 2022-06-20 | Disposition: A | Discharge status: Home / Self Care | Payer: Medicare Other | Source: Ambulatory Visit | Attending: Hematology and Oncology | Admitting: Hematology and Oncology

## 2022-06-20 ENCOUNTER — Inpatient Hospital Stay: Payer: Medicare Other | Attending: Gynecologic Oncology

## 2022-06-20 DIAGNOSIS — N281 Cyst of kidney, acquired: Secondary | ICD-10-CM | POA: Diagnosis not present

## 2022-06-20 DIAGNOSIS — K7689 Other specified diseases of liver: Secondary | ICD-10-CM | POA: Diagnosis not present

## 2022-06-20 DIAGNOSIS — C541 Malignant neoplasm of endometrium: Secondary | ICD-10-CM | POA: Insufficient documentation

## 2022-06-20 DIAGNOSIS — K5730 Diverticulosis of large intestine without perforation or abscess without bleeding: Secondary | ICD-10-CM | POA: Diagnosis not present

## 2022-06-20 DIAGNOSIS — Z923 Personal history of irradiation: Secondary | ICD-10-CM | POA: Insufficient documentation

## 2022-06-20 DIAGNOSIS — Z7189 Other specified counseling: Secondary | ICD-10-CM

## 2022-06-20 LAB — CBC WITH DIFFERENTIAL (CANCER CENTER ONLY)
Abs Immature Granulocytes: 0.03 10*3/uL (ref 0.00–0.07)
Basophils Absolute: 0 10*3/uL (ref 0.0–0.1)
Basophils Relative: 1 %
Eosinophils Absolute: 0.1 10*3/uL (ref 0.0–0.5)
Eosinophils Relative: 3 %
HCT: 36.8 % (ref 36.0–46.0)
Hemoglobin: 12.1 g/dL (ref 12.0–15.0)
Immature Granulocytes: 1 %
Lymphocytes Relative: 24 %
Lymphs Abs: 1.1 10*3/uL (ref 0.7–4.0)
MCH: 25.2 pg — ABNORMAL LOW (ref 26.0–34.0)
MCHC: 32.9 g/dL (ref 30.0–36.0)
MCV: 76.5 fL — ABNORMAL LOW (ref 80.0–100.0)
Monocytes Absolute: 0.4 10*3/uL (ref 0.1–1.0)
Monocytes Relative: 8 %
Neutro Abs: 2.8 10*3/uL (ref 1.7–7.7)
Neutrophils Relative %: 63 %
Platelet Count: 204 10*3/uL (ref 150–400)
RBC: 4.81 MIL/uL (ref 3.87–5.11)
RDW: 15.2 % (ref 11.5–15.5)
WBC Count: 4.4 10*3/uL (ref 4.0–10.5)
nRBC: 0 % (ref 0.0–0.2)

## 2022-06-20 LAB — CMP (CANCER CENTER ONLY)
ALT: 10 U/L (ref 0–44)
AST: 15 U/L (ref 15–41)
Albumin: 3.9 g/dL (ref 3.5–5.0)
Alkaline Phosphatase: 96 U/L (ref 38–126)
Anion gap: 7 (ref 5–15)
BUN: 11 mg/dL (ref 8–23)
CO2: 29 mmol/L (ref 22–32)
Calcium: 9.6 mg/dL (ref 8.9–10.3)
Chloride: 103 mmol/L (ref 98–111)
Creatinine: 0.79 mg/dL (ref 0.44–1.00)
GFR, Estimated: >60 mL/min (ref >60)
Glucose, Bld: 101 mg/dL — ABNORMAL HIGH (ref 70–99)
Potassium: 4 mmol/L (ref 3.5–5.1)
Sodium: 139 mmol/L (ref 135–145)
Total Bilirubin: 0.5 mg/dL (ref 0.3–1.2)
Total Protein: 7.2 g/dL (ref 6.5–8.1)

## 2022-06-20 MED ORDER — HEPARIN SOD (PORK) LOCK FLUSH 100 UNIT/ML IV SOLN
INTRAVENOUS | Status: AC
  Administered 2022-06-20: 500 [IU] via INTRAVENOUS
  Filled 2022-06-20: qty 5

## 2022-06-20 MED ORDER — SODIUM CHLORIDE 0.9% FLUSH
10.0000 mL | Freq: Once | INTRAVENOUS | Status: AC
  Administered 2022-06-20: 10 mL

## 2022-06-20 MED ORDER — HEPARIN SOD (PORK) LOCK FLUSH 100 UNIT/ML IV SOLN: 500.0000 [IU] | Freq: Once | INTRAVENOUS | Status: AC

## 2022-06-22 ENCOUNTER — Other Ambulatory Visit: Payer: Self-pay

## 2022-06-22 ENCOUNTER — Encounter: Payer: Self-pay | Admitting: Hematology and Oncology

## 2022-06-22 ENCOUNTER — Inpatient Hospital Stay (HOSPITAL_BASED_OUTPATIENT_CLINIC_OR_DEPARTMENT_OTHER): Payer: Medicare Other | Admitting: Hematology and Oncology

## 2022-06-22 VITALS — BP 149/79 | HR 66 | Temp 97.7°F | Resp 18 | Ht 62.0 in | Wt 212.4 lb

## 2022-06-22 DIAGNOSIS — C541 Malignant neoplasm of endometrium: Secondary | ICD-10-CM

## 2022-06-22 DIAGNOSIS — Z923 Personal history of irradiation: Secondary | ICD-10-CM | POA: Diagnosis not present

## 2022-07-05 ENCOUNTER — Ambulatory Visit: Payer: Medicare Other | Admitting: Nurse Practitioner

## 2022-07-09 ENCOUNTER — Encounter: Payer: Self-pay | Admitting: Internal Medicine

## 2022-07-09 ENCOUNTER — Ambulatory Visit (INDEPENDENT_AMBULATORY_CARE_PROVIDER_SITE_OTHER): Payer: Medicare Other | Admitting: Internal Medicine

## 2022-07-09 VITALS — BP 136/82 | HR 64 | Temp 98.1°F | Ht 62.0 in | Wt 212.1 lb

## 2022-07-09 DIAGNOSIS — E039 Hypothyroidism, unspecified: Secondary | ICD-10-CM | POA: Diagnosis not present

## 2022-07-09 DIAGNOSIS — I10 Essential (primary) hypertension: Secondary | ICD-10-CM

## 2022-07-09 DIAGNOSIS — E785 Hyperlipidemia, unspecified: Secondary | ICD-10-CM

## 2022-07-09 DIAGNOSIS — I480 Paroxysmal atrial fibrillation: Secondary | ICD-10-CM | POA: Diagnosis not present

## 2022-07-09 LAB — LIPID PANEL
Cholesterol: 239 mg/dL — ABNORMAL HIGH (ref 0–200)
HDL: 67.7 mg/dL (ref >39.00)
LDL Cholesterol: 159 mg/dL — ABNORMAL HIGH (ref 0–99)
NonHDL: 171.6
Total CHOL/HDL Ratio: 4
Triglycerides: 63 mg/dL (ref 0.0–149.0)
VLDL: 12.6 mg/dL (ref 0.0–40.0)

## 2022-07-09 LAB — TSH: TSH: 0.08 u[IU]/mL — ABNORMAL LOW (ref 0.35–5.50)

## 2022-07-09 MED ORDER — LEVOTHYROXINE SODIUM 75 MCG PO TABS: 75.0000 ug | ORAL_TABLET | Freq: Every day | ORAL | 0 refills | Status: DC

## 2022-07-09 NOTE — Patient Instructions (Signed)

## 2022-07-09 NOTE — Progress Notes (Unsigned)
Subjective:  Patient ID: Tina Stone, female    DOB: 02-21-1950  Age: 72 y.o. MRN: [id number]  CC: Hyperlipidemia and Hypothyroidism   HPI Kinzi Frediani presents for f/up -  She complains of an intermittent tingling sensation, dyspnea on exertion, and palpitations.  She is not taking rosuvastatin because it caused muscle aches.  She denies chest pain, diaphoresis, abdominal pain, vomiting, or diarrhea.  Outpatient Medications Prior to Visit  Medication Sig Dispense Refill   acetaminophen (TYLENOL) 500 MG tablet Take 1,000 mg by mouth every 6 (six) hours as needed for moderate pain.     apixaban (ELIQUIS) 5 MG TABS tablet Take 1 tablet (5 mg total) by mouth 2 (two) times daily. 180 tablet 1   APPLE CIDER VINEGAR PO Take 5 mLs by mouth daily.      atenolol (TENORMIN) 25 MG tablet TAKE 1 TABLET (25 MG TOTAL) BY MOUTH DAILY. 90 tablet 1   Cholecalciferol (VITAMIN D3) 50 MCG (2000 UT) TABS Take by mouth.     EPINEPHrine (EPIPEN 2-PAK) 0.3 mg/0.3 mL IJ SOAJ injection Inject 0.3 mg into the muscle as needed for anaphylaxis. 1 each 2   levocetirizine (XYZAL) 5 MG tablet Can use up to 2 tablets in the morning and 2 tablets at night for hives/itching. This is maximum dose. Titrate as needed. 120 tablet 3   Multiple Vitamin (MULTIVITAMIN) capsule Take 1 capsule by mouth daily.     ondansetron (ZOFRAN) 4 MG tablet Take 1 tablet (4 mg total) by mouth every 8 (eight) hours as needed for nausea or vomiting. 20 tablet 0   levothyroxine (SYNTHROID) 100 MCG tablet Take 100 mcg by mouth daily before breakfast.     rosuvastatin (CRESTOR) 10 MG tablet Take 1 tablet (10 mg total) by mouth daily. (Patient not taking: Reported on 05/15/2022) 90 tablet 3   No facility-administered medications prior to visit.    ROS Review of Systems  Constitutional:  Negative for chills, diaphoresis, fatigue and fever.  HENT: Negative.  Negative for trouble swallowing.   Eyes: Negative.   Respiratory:  Positive  for shortness of breath (DOE). Negative for cough, chest tightness and wheezing.   Cardiovascular:  Positive for palpitations. Negative for chest pain and leg swelling.  Gastrointestinal:  Negative for abdominal pain, diarrhea, nausea and vomiting.  Endocrine: Negative.   Genitourinary: Negative.  Negative for difficulty urinating and dysuria.  Musculoskeletal:  Positive for arthralgias. Negative for myalgias.  Skin: Negative.  Negative for color change and pallor.  Neurological:  Negative for dizziness, weakness, light-headedness and headaches.  Hematological:  Negative for adenopathy. Does not bruise/bleed easily.  Psychiatric/Behavioral: Negative.      Objective:  BP 136/82 (BP Location: Right Arm, Patient Position: Sitting)   Pulse 64   Temp 98.1 F (36.7 C) (Oral)   Ht '5\' 2"'$  (1.575 m)   Wt 212 lb 2 oz (96.2 kg)   SpO2 97%   BMI 38.80 kg/m   BP Readings from Last 3 Encounters:  07/09/22 136/82  06/22/22 (!) 149/79  05/03/22 (!) 141/87    Wt Readings from Last 3 Encounters:  07/09/22 212 lb 2 oz (96.2 kg)  06/22/22 212 lb 6.4 oz (96.3 kg)  05/03/22 209 lb 4 oz (94.9 kg)    Physical Exam Vitals reviewed.  HENT:     Nose: Nose normal.     Mouth/Throat:     Mouth: Mucous membranes are moist.  Eyes:     General: No scleral icterus.  Conjunctiva/sclera: Conjunctivae normal.  Cardiovascular:     Rate and Rhythm: Normal rate and regular rhythm.     Heart sounds: No murmur heard. Pulmonary:     Effort: Pulmonary effort is normal.     Breath sounds: No stridor. No wheezing, rhonchi or rales.  Abdominal:     General: Abdomen is flat.     Palpations: There is no mass.     Tenderness: There is no abdominal tenderness. There is no guarding.     Hernia: No hernia is present.  Musculoskeletal:        General: Normal range of motion.     Cervical back: Neck supple.     Right lower leg: No edema.     Left lower leg: No edema.  Lymphadenopathy:     Cervical: No  cervical adenopathy.  Skin:    General: Skin is warm and dry.  Neurological:     General: No focal deficit present.     Mental Status: She is alert.  Psychiatric:        Mood and Affect: Mood normal.        Behavior: Behavior normal.     Lab Results  Component Value Date   WBC 4.4 06/20/2022   HGB 12.1 06/20/2022   HCT 36.8 06/20/2022   PLT 204 06/20/2022   GLUCOSE 101 (H) 06/20/2022   CHOL 239 (H) 07/09/2022   TRIG 63.0 07/09/2022   HDL 67.70 07/09/2022   LDLCALC 159 (H) 07/09/2022   ALT 10 06/20/2022   AST 15 06/20/2022   NA 139 06/20/2022   K 4.0 06/20/2022   CL 103 06/20/2022   CREATININE 0.79 06/20/2022   BUN 11 06/20/2022   CO2 29 06/20/2022   TSH 0.08 (L) 07/09/2022   INR 1.0 01/28/2021   HGBA1C 5.3 02/02/2022    CT Abdomen Pelvis Wo Contrast  Result Date: 06/21/2022 CLINICAL DATA:  Restaging endometrial cancer. Initial diagnosis 2020. Status post hysterectomy and chemotherapy. EXAM: CT ABDOMEN AND PELVIS WITHOUT CONTRAST TECHNIQUE: Multidetector CT imaging of the abdomen and pelvis was performed following the standard protocol without IV contrast. RADIATION DOSE REDUCTION: This exam was performed according to the departmental dose-optimization program which includes automated exposure control, adjustment of the mA and/or kV according to patient size and/or use of iterative reconstruction technique. COMPARISON:  02/02/2022 FINDINGS: Lower chest: The lung bases are clear of an acute process. No worrisome pulmonary lesions or pulmonary nodules. No pleural effusion or pleural nodules. The heart is normal in size. No pericardial effusion. Stable coronary artery calcifications. Surgical changes noted in the lower left medial breast. Hepatobiliary: Stable small scattered low-attenuation hepatic lesions consistent with benign cysts. No worrisome hepatic lesions without contrast. No obvious peritoneal surface disease. The gallbladder is surgically absent. No common bile duct  dilatation. Pancreas: No mass, inflammation or ductal dilatation. Spleen: Normal size. No focal lesions. Adrenals/Urinary Tract: Adrenal glands and kidneys are unremarkable. Stable small cysts. The bladder is unremarkable. Stomach/Bowel: The stomach, duodenum, small and colon are unremarkable. No acute inflammatory process, mass lesions or obstructive findings. The terminal ileum and appendix are normal. Moderate stool burden suggesting constipation. Stable sigmoid colon diverticulosis. Vascular/Lymphatic: Stable atherosclerotic calcifications involving the aorta and branch vessels but no aneurysm. No mesenteric or retroperitoneal lymphadenopathy. Interval decrease in size of the right external iliac nodal disease. There is a residual partially calcified node on image 66/2 measuring 15 x 13 mm. This previously measured 29 x 25 mm. The obturator node on image 69/2 measures 7  mm and previously measured 9 mm. Stable 7 mm left femoral node on image 72/2. No new or progressive findings in the pelvis. No inguinal adenopathy. Small scattered nodes are stable. No omental or peritoneal implants are identified. Reproductive: Surgically absent. Other: No ascites or abdominal hernia. No subcutaneous lesions. Musculoskeletal: No significant bony findings. Stable age advanced degenerative changes involving the spine. IMPRESSION: 1. Interval decrease in size of the right external iliac nodal disease suggesting a good response to treatment. 2. No new or progressive findings. 3. Stable scattered low-attenuation hepatic lesions. No findings suspicious for recurrent hepatic metastatic disease. 4. Status post cholecystectomy. No biliary dilatation. 5. Moderate stool burden suggesting constipation. Aortic Atherosclerosis (ICD10-I70.0). Electronically Signed   By: Marijo Sanes M.D.   On: 06/21/2022 10:24    Assessment & Plan:   Naylah was seen today for hyperlipidemia and hypothyroidism.  Diagnoses and all orders for this  visit:  Acquired hypothyroidism- Her TSH is low. Will lower her T4 dose. -     Cancel: TSH; Future -     Lipid panel; Future -     TSH; Future -     TSH -     Lipid panel -     levothyroxine (SYNTHROID) 75 MCG tablet; Take 1 tablet (75 mcg total) by mouth daily.  Essential hypertension- Her BP is well controlled.  Paroxysmal atrial fibrillation ([place])- She has good rate/rhythm control. -     TSH; Future -     TSH  Hyperlipidemia with target LDL less than 100- She is not willing to take statin. -     Lipid panel; Future -     TSH; Future -     TSH -     Lipid panel   I have discontinued Chloeanne E. Abdallah's levothyroxine and rosuvastatin. I am also having her start on levothyroxine. Additionally, I am having her maintain her APPLE CIDER VINEGAR PO, multivitamin, acetaminophen, Vitamin D3, apixaban, EPINEPHrine, atenolol, levocetirizine, and ondansetron.  Meds ordered this encounter  Medications   levothyroxine (SYNTHROID) 75 MCG tablet    Sig: Take 1 tablet (75 mcg total) by mouth daily.    Dispense:  90 tablet    Refill:  0     Follow-up: Return in about 6 months (around 01/09/2023).  Scarlette Calico, MD

## 2022-07-11 ENCOUNTER — Other Ambulatory Visit: Payer: Medicare Other

## 2022-07-11 DIAGNOSIS — E669 Obesity, unspecified: Secondary | ICD-10-CM

## 2022-07-11 DIAGNOSIS — R7303 Prediabetes: Secondary | ICD-10-CM

## 2022-07-11 DIAGNOSIS — I700 Atherosclerosis of aorta: Secondary | ICD-10-CM

## 2022-07-11 DIAGNOSIS — E66812 Obesity, class 2: Secondary | ICD-10-CM

## 2022-07-11 DIAGNOSIS — E785 Hyperlipidemia, unspecified: Secondary | ICD-10-CM | POA: Diagnosis not present

## 2022-07-11 DIAGNOSIS — I480 Paroxysmal atrial fibrillation: Secondary | ICD-10-CM

## 2022-07-11 DIAGNOSIS — I10 Essential (primary) hypertension: Secondary | ICD-10-CM

## 2022-07-11 LAB — LIPID PANEL
Chol/HDL Ratio: 3.6 ratio (ref 0.0–4.4)
Cholesterol, Total: 247 mg/dL — ABNORMAL HIGH (ref 100–199)
HDL: 69 mg/dL (ref >39)
LDL Chol Calc (NIH): 167 mg/dL — ABNORMAL HIGH (ref 0–99)
Triglycerides: 67 mg/dL (ref 0–149)
VLDL Cholesterol Cal: 11 mg/dL (ref 5–40)

## 2022-07-12 ENCOUNTER — Other Ambulatory Visit: Payer: Self-pay

## 2022-07-12 ENCOUNTER — Ambulatory Visit (INDEPENDENT_AMBULATORY_CARE_PROVIDER_SITE_OTHER): Payer: Medicare Other | Admitting: Gastroenterology

## 2022-07-12 ENCOUNTER — Other Ambulatory Visit (HOSPITAL_COMMUNITY): Payer: Self-pay

## 2022-07-12 ENCOUNTER — Encounter: Payer: Self-pay | Admitting: Gastroenterology

## 2022-07-12 VITALS — BP 132/78 | HR 83 | Ht 63.0 in | Wt 211.0 lb

## 2022-07-12 DIAGNOSIS — E785 Hyperlipidemia, unspecified: Secondary | ICD-10-CM

## 2022-07-12 DIAGNOSIS — K219 Gastro-esophageal reflux disease without esophagitis: Secondary | ICD-10-CM | POA: Diagnosis not present

## 2022-07-12 DIAGNOSIS — A048 Other specified bacterial intestinal infections: Secondary | ICD-10-CM

## 2022-07-12 MED ORDER — PANTOPRAZOLE SODIUM 40 MG PO TBEC
40.0000 mg | DELAYED_RELEASE_TABLET | Freq: Every day | ORAL | 3 refills | Status: DC
  Filled 2022-07-12: qty 90, 90d supply, fill #0

## 2022-07-12 NOTE — Progress Notes (Signed)
Tina Stone    [id number]    March 24, 1950  Primary Care Physician:Jones, Arvid Right, MD  Referring Physician: [name], MD [address] [place],  [place] [address]   Chief complaint:  h/o colon polyps, H.pylori  HPI:  72 year old very pleasant female here for follow-up visit for H. pylori gastritis and dyspepsia  She is s/p treatment of H. pylori with confirmed eradication  H.pylori stool Ag negative May 29, 2022: negative  Overall her symptoms have improved significantly, she has no specific GI complaints Denies any nausea, vomiting, abdominal pain, melena or bright red blood per rectum   Colonoscopy 10/06/2018 - The perianal and digital rectal examinations were normal. - A 3 mm polyp was found in the ascending colon. The polyp was sessile. The polyp was removed with a cold biopsy forceps. Resection and retrieval were complete. - A 8 mm polyp was found in the ascending colon. The polyp was sessile. The polyp was removed with a cold snare. Resection and retrieval were complete. - Multiple small and large-mouthed diverticula were found in the sigmoid colon and descending colon. - Non-bleeding internal hemorrhoids were found during retroflexion. The hemorrhoids     Outpatient Encounter Medications as of 07/12/2022  Medication Sig   acetaminophen (TYLENOL) 500 MG tablet Take 1,000 mg by mouth every 6 (six) hours as needed for moderate pain.   apixaban (ELIQUIS) 5 MG TABS tablet Take 1 tablet (5 mg total) by mouth 2 (two) times daily.   APPLE CIDER VINEGAR PO Take 5 mLs by mouth daily.    atenolol (TENORMIN) 25 MG tablet TAKE 1 TABLET (25 MG TOTAL) BY MOUTH DAILY.   Cholecalciferol (VITAMIN D3) 50 MCG (2000 UT) TABS Take by mouth.   EPINEPHrine (EPIPEN 2-PAK) 0.3 mg/0.3 mL IJ SOAJ injection Inject 0.3 mg into the muscle as needed for anaphylaxis.   levocetirizine (XYZAL) 5 MG tablet Can use up to 2 tablets in the morning and 2 tablets at night for  hives/itching. This is maximum dose. Titrate as needed.   levothyroxine (SYNTHROID) 75 MCG tablet Take 1 tablet (75 mcg total) by mouth daily.   Multiple Vitamin (MULTIVITAMIN) capsule Take 1 capsule by mouth daily.   ondansetron (ZOFRAN) 4 MG tablet Take 1 tablet (4 mg total) by mouth every 8 (eight) hours as needed for nausea or vomiting.   No facility-administered encounter medications on file as of 07/12/2022.    Allergies as of 07/12/2022 - Review Complete 07/12/2022  Allergen Reaction Noted   Benicar hct [olmesartan medoxomil-hctz] Shortness Of Breath and Palpitations 03/05/2019   Iodinated contrast media Hives and Itching 05/10/2020   Crestor [rosuvastatin] Other (See Comments) 07/09/2022   Bactrim [sulfamethoxazole-trimethoprim] Other (See Comments) 02/11/2015   Pravachol [pravastatin] Other (See Comments) 03/23/2015   Shellfish allergy Nausea And Vomiting 08/23/2018   Amlodipine Palpitations 02/25/2019    Past Medical History:  Diagnosis Date   Adenomatous colon polyp    Allergic rhinitis, seasonal    Allergy    Atrial fibrillation ([place])    Beta thalassemia trait 11/25/2013   Cholelithiasis    Class 3 obesity without serious comorbidity with body mass index (BMI) of 40.0 to 44.9 in adult 11/19/2012   endometrial ca dx'd 08/2009   endometrial    GERD (gastroesophageal reflux disease)    H. pylori infection    History of radiation therapy    endometrial - 02/19/2022-03/23/2022 Dr Gery Pray   HLD (hyperlipidemia)  Hypercholesterolemia    Hypertension 03/18/2017   no meds    Hypothyroidism    Intraductal papilloma of left breast    Patient underwent left needle-localized lumpectomy by Dr. Imogene Burn. Tsuei on 09/09/2013; pathology showed intraductal papilloma with no atypia or malignancy identified.   PONV (postoperative nausea and vomiting)    Pre-diabetes    pt denies   Urticaria    Uterine fibroid     Past Surgical History:  Procedure Laterality Date   ANKLE  FRACTURE SURGERY Right 1966   right   BREAST EXCISIONAL BIOPSY Left 2014   benign   BREAST LUMPECTOMY WITH NEEDLE LOCALIZATION Left 09/09/2013   Procedure: BREAST LUMPECTOMY WITH NEEDLE LOCALIZATION;  Surgeon: Imogene Burn. Georgette Dover, MD;  Location: [facility];  Service: General;  Laterality: Left;   CHOLECYSTECTOMY     COLONOSCOPY     IR IMAGING GUIDED PORT INSERTION  05/18/2020   POLYPECTOMY     ROBOTIC ASSISTED LAPAROSCOPIC CHOLECYSTECTOMY  09/09/2019   ROBOTIC ASSISTED TOTAL HYSTERECTOMY WITH BILATERAL SALPINGO OOPHERECTOMY N/A 10/22/2019   Procedure: XI ROBOTIC ASSISTED TOTAL HYSTERECTOMY WITH BILATERAL SALPINGO OOPHORECTOMY GREATER THAN 250 GRAMS, MINI LAPARTOMY FOR SPECIMEN DELIVERY; PELVIC AND PERI-AORTIC LYMPHADENECTOMY;  Surgeon: Everitt Amber, MD;  Location: [facility];  Service: Gynecology;  Laterality: N/A;   SENTINEL NODE BIOPSY N/A 10/22/2019   Procedure: SENTINEL NODE BIOPSY;  Surgeon: Everitt Amber, MD;  Location: [facility];  Service: Gynecology;  Laterality: N/A;    Family History  Problem Relation Age of Onset   Diabetes Mother    Hypertension Mother    Colon polyps Mother 76   Dementia Mother 67   Diabetes Father    Congestive Heart Failure Father    Asthma Brother    Diabetes Brother    Hypertension Brother    Hypertension Brother    Hypertension Maternal Grandmother    Diabetes Maternal Grandmother    Asthma Paternal Grandmother    Lung disease Paternal Grandmother    Diabetes Paternal Grandfather    Pancreatic cancer Paternal Aunt    Uterine cancer Paternal Aunt    Colon cancer Neg Hx    Breast cancer Neg Hx    Lung cancer Neg Hx    Esophageal cancer Neg Hx    Rectal cancer Neg Hx    Stomach cancer Neg Hx     Social History   Socioeconomic History   Marital status: Widowed    Spouse name: Not on file   Number of children: 0   Years of education: Not on file   Highest education level: Not on file  Occupational History   Occupation: Lawyer  Tobacco Use    Smoking status: Never   Smokeless tobacco: Never   Tobacco comments:    few puffs but not a true smoker quit many yrs ago  Vaping Use   Vaping Use: Never used  Substance and Sexual Activity   Alcohol use: Never   Drug use: Never   Sexual activity: Not Currently  Other Topics Concern   Not on file  Social History Narrative   ** Merged History Encounter **       Lives in [place], widowed 2003   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         Social Determinants of Health   Financial Resource Strain: [place]  (05/15/2022)   Overall Financial Resource Strain (CARDIA)    Difficulty of Paying Living Expenses: Not hard at all  Food Insecurity: No [place] (05/15/2022)  Hunger Vital Sign    Worried About Running Out of Food in the Last Year: Never true    Ran Out of Food in the Last Year: Never true  Transportation Needs: No Transportation Needs (05/15/2022)   PRAPARE - Hydrologist (Medical): No    Lack of Transportation (Non-Medical): No  Physical Activity: Sufficiently Active (05/15/2022)   Exercise Vital Sign    Days of Exercise per Week: 5 days    Minutes of Exercise per Session: 60 min  Stress: No Stress Concern Present (05/15/2022)   [place]    Feeling of Stress : Not at all  Social Connections: Moderately Integrated (05/15/2022)   Social Connection and Isolation Panel [NHANES]    Frequency of Communication with Friends and Family: More than three times a week    Frequency of Social Gatherings with Friends and Family: More than three times a week    Attends Religious Services: More than 4 times per year    Active Member of [employer] or Organizations: Yes    Attends Archivist Meetings: More than 4 times per year    Marital Status: Widowed  Intimate Partner Violence: Not At Risk (05/15/2022)   Humiliation, Afraid, Rape, and Kick questionnaire    Fear of Current  or Ex-Partner: No    Emotionally Abused: No    Physically Abused: No    Sexually Abused: No      Review of systems: All other review of systems negative except as mentioned in the HPI.   Physical Exam: Vitals:   07/12/22 1511  BP: 132/78  Pulse: 83  SpO2: 98%   Body mass index is 37.38 kg/m. Gen:      No acute distress HEENT:  sclera anicteric Abd:      soft, non-tender; no palpable masses, no distension Ext:    No edema Neuro: alert and oriented x 3 Psych: normal mood and affect  Data Reviewed:  Reviewed labs, radiology imaging, old records and pertinent past GI work up   Assessment and Plan/Recommendations:  72 year old very pleasant female with history of H. pylori gastritis status posttreatment, GERD Discussed antireflux measures Use pantoprazole 40 mg daily as needed  History of colon polyps: Due for surveillance colonoscopy in October 2024  Return in 1 year or sooner if needed   This visit required >30 minutes of patient care (this includes precharting, chart review, review of results, face-to-face time used for counseling as well as treatment plan and follow-up. The patient was provided an opportunity to ask questions and all were answered. The patient agreed with the plan and demonstrated an understanding of the instructions.  Damaris Hippo , MD    CC: [name], MD

## 2022-07-12 NOTE — Patient Instructions (Addendum)
Your provider has requested that you go to the basement level for lab work before leaving today. Press "B" on the elevator. The lab is located at the first door on the left as you exit the elevator.   Gastroesophageal Reflux Disease, Adult Gastroesophageal reflux (GER) happens when acid from the stomach flows up into the tube that connects the mouth and the stomach (esophagus). Normally, food travels down the esophagus and stays in the stomach to be digested. However, when a person has GER, food and stomach acid sometimes move back up into the esophagus. If this becomes a more serious problem, the person may be diagnosed with a disease called gastroesophageal reflux disease (GERD). GERD occurs when the reflux: Happens often. Causes frequent or severe symptoms. Causes problems such as damage to the esophagus. When stomach acid comes in contact with the esophagus, the acid may cause inflammation in the esophagus. Over time, GERD may create small holes (ulcers) in the lining of the esophagus. What are the causes? This condition is caused by a problem with the muscle between the esophagus and the stomach (lower esophageal sphincter, or LES). Normally, the LES muscle closes after food passes through the esophagus to the stomach. When the LES is weakened or abnormal, it does not close properly, and that allows food and stomach acid to go back up into the esophagus. The LES can be weakened by certain dietary substances, medicines, and medical conditions, including: Tobacco use. Pregnancy. Having a hiatal hernia. Alcohol use. Certain foods and beverages, such as coffee, chocolate, onions, and peppermint. What increases the risk? You are more likely to develop this condition if you: Have an increased body weight. Have a connective tissue disorder. Take NSAIDs, such as ibuprofen. What are the signs or symptoms? Symptoms of this condition include: Heartburn. Difficult or painful swallowing and the  feeling of having a lump in the throat. A bitter taste in the mouth. Bad breath and having a large amount of saliva. Having an upset or bloated stomach and belching. Chest pain. Different conditions can cause chest pain. Make sure you see your health care provider if you experience chest pain. Shortness of breath or wheezing. Ongoing (chronic) cough or a nighttime cough. Wearing away of tooth enamel. Weight loss. How is this diagnosed? This condition may be diagnosed based on a medical history and a physical exam. To determine if you have mild or severe GERD, your health care provider may also monitor how you respond to treatment. You may also have tests, including: A test to examine your stomach and esophagus with a small camera (endoscopy). A test that measures the acidity level in your esophagus. A test that measures how much pressure is on your esophagus. A barium swallow or modified barium swallow test to show the shape, size, and functioning of your esophagus. How is this treated? Treatment for this condition may vary depending on how severe your symptoms are. Your health care provider may recommend: Changes to your diet. Medicine. Surgery. The goal of treatment is to help relieve your symptoms and to prevent complications. Follow these instructions at home: Eating and drinking  Follow a diet as recommended by your health care provider. This may involve avoiding foods and drinks such as: Coffee and tea, with or without caffeine. Drinks that contain alcohol. Energy drinks and sports drinks. Carbonated drinks or sodas. Chocolate and cocoa. Peppermint and mint flavorings. Garlic and onions. Horseradish. Spicy and acidic foods, including peppers, chili powder, curry powder, vinegar, hot sauces, and barbecue  sauce. Citrus fruit juices and citrus fruits, such as oranges, lemons, and limes. Tomato-based foods, such as red sauce, chili, salsa, and pizza with red sauce. Fried and  fatty foods, such as donuts, french fries, potato chips, and high-fat dressings. High-fat meats, such as hot dogs and fatty cuts of red and white meats, such as rib eye steak, sausage, ham, and bacon. High-fat dairy items, such as whole milk, butter, and cream cheese. Eat small, frequent meals instead of large meals. Avoid drinking large amounts of liquid with your meals. Avoid eating meals during the 2-3 hours before bedtime. Avoid lying down right after you eat. Do not exercise right after you eat. Lifestyle  Do not use any products that contain nicotine or tobacco. These products include cigarettes, chewing tobacco, and vaping devices, such as e-cigarettes. If you need help quitting, ask your health care provider. Try to reduce your stress by using methods such as yoga or meditation. If you need help reducing stress, ask your health care provider. If you are overweight, reduce your weight to an amount that is healthy for you. Ask your health care provider for guidance about a safe weight loss goal. General instructions Pay attention to any changes in your symptoms. Take over-the-counter and prescription medicines only as told by your health care provider. Do not take aspirin, ibuprofen, or other NSAIDs unless your health care provider told you to take these medicines. Wear loose-fitting clothing. Do not wear anything tight around your waist that causes pressure on your abdomen. Raise (elevate) the head of your bed about 6 inches (15 cm). You can use a wedge to do this. Avoid bending over if this makes your symptoms worse. Keep all follow-up visits. This is important. Contact a health care provider if: You have: New symptoms. Unexplained weight loss. Difficulty swallowing or it hurts to swallow. Wheezing or a persistent cough. A hoarse voice. Your symptoms do not improve with treatment. Get help right away if: You have sudden pain in your arms, neck, jaw, teeth, or back. You suddenly  feel sweaty, dizzy, or light-headed. You have chest pain or shortness of breath. You vomit and the vomit is green, yellow, or black, or it looks like blood or coffee grounds. You faint. You have stool that is red, bloody, or black. You cannot swallow, drink, or eat. These symptoms may represent a serious problem that is an emergency. Do not wait to see if the symptoms will go away. Get medical help right away. Call your local emergency services (911 in the [place] Do not drive yourself to the hospital. Summary Gastroesophageal reflux happens when acid from the stomach flows up into the esophagus. GERD is a disease in which the reflux happens often, causes frequent or severe symptoms, or causes problems such as damage to the esophagus. Treatment for this condition may vary depending on how severe your symptoms are. Your health care provider may recommend diet and lifestyle changes, medicine, or surgery. Contact a health care provider if you have new or worsening symptoms. Take over-the-counter and prescription medicines only as told by your health care provider. Do not take aspirin, ibuprofen, or other NSAIDs unless your health care provider told you to do so. Keep all follow-up visits as told by your health care provider. This is important. This information is not intended to replace advice given to you by your health care provider. Make sure you discuss any questions you have with your health care provider. Document Revised: 06/27/2020 Document Reviewed: 06/27/2020 Elsevier Patient  Education  [place].  Follow up in 1 year  I appreciate the  opportunity to care for you  Thank You   Harl Bowie , MD

## 2022-07-13 ENCOUNTER — Ambulatory Visit: Payer: Medicare Other | Admitting: Gastroenterology

## 2022-07-13 DIAGNOSIS — H524 Presbyopia: Secondary | ICD-10-CM | POA: Diagnosis not present

## 2022-07-13 DIAGNOSIS — H40013 Open angle with borderline findings, low risk, bilateral: Secondary | ICD-10-CM | POA: Diagnosis not present

## 2022-07-13 DIAGNOSIS — H25813 Combined forms of age-related cataract, bilateral: Secondary | ICD-10-CM | POA: Diagnosis not present

## 2022-07-13 DIAGNOSIS — H04123 Dry eye syndrome of bilateral lacrimal glands: Secondary | ICD-10-CM | POA: Diagnosis not present

## 2022-07-13 DIAGNOSIS — H35033 Hypertensive retinopathy, bilateral: Secondary | ICD-10-CM | POA: Diagnosis not present

## 2022-07-24 ENCOUNTER — Other Ambulatory Visit: Payer: Self-pay | Admitting: Internal Medicine

## 2022-07-24 DIAGNOSIS — I10 Essential (primary) hypertension: Secondary | ICD-10-CM

## 2022-07-24 DIAGNOSIS — D6869 Other thrombophilia: Secondary | ICD-10-CM

## 2022-07-24 DIAGNOSIS — I480 Paroxysmal atrial fibrillation: Secondary | ICD-10-CM

## 2022-07-30 ENCOUNTER — Encounter: Payer: Self-pay | Admitting: Gastroenterology

## 2022-08-01 ENCOUNTER — Telehealth: Payer: Self-pay | Admitting: Pharmacist

## 2022-08-01 ENCOUNTER — Ambulatory Visit (INDEPENDENT_AMBULATORY_CARE_PROVIDER_SITE_OTHER): Payer: Medicare Other | Admitting: Pharmacist

## 2022-08-01 ENCOUNTER — Encounter: Payer: Self-pay | Admitting: Pharmacist

## 2022-08-01 DIAGNOSIS — E785 Hyperlipidemia, unspecified: Secondary | ICD-10-CM | POA: Diagnosis not present

## 2022-08-01 DIAGNOSIS — I700 Atherosclerosis of aorta: Secondary | ICD-10-CM | POA: Diagnosis not present

## 2022-08-01 NOTE — Progress Notes (Signed)
Patient ID: Tina Stone                 DOB: 1950-09-12                    MRN: [id number]     HPI: Tina Stone is a 72 y.o. female patient referred to lipid clinic by Estella Husk. PMH is significant for HTN, A fib, aortic atherosclerosis, HLD, and hypothyroidism.  Has an intolerance to statins.  Patient presents today in good spirits. Is hesitant to start cholesterol medications due to adverse effects. Atorvastatin and rosuvastatin caused muscle pains. Pravastatin caused stomach pain.  Reports a family history of elevated cholesterol.  After last lipid panel she has worked on reducing sweets from her diet.  Current Medications: N/A  Intolerances:  Rosuvastatin Pravastatin  Risk Factors:  Aortic Atherosclerosis  HLD  LDL goal: <70  Labs: TC 247, Trigs 67, HDL 69, LDL 167 (07/11/22 -  not on any medications)  Past Medical History:  Diagnosis Date   Adenomatous colon polyp    Allergic rhinitis, seasonal    Allergy    Atrial fibrillation ([place])    Beta thalassemia trait 11/25/2013   Cholelithiasis    Class 3 obesity without serious comorbidity with body mass index (BMI) of 40.0 to 44.9 in adult 11/19/2012   endometrial ca dx'd 08/2009   endometrial    GERD (gastroesophageal reflux disease)    H. pylori infection    History of radiation therapy    endometrial - 02/19/2022-03/23/2022 Dr Gery Pray   HLD (hyperlipidemia)    Hypercholesterolemia    Hypertension 03/18/2017   no meds    Hypothyroidism    Intraductal papilloma of left breast    Patient underwent left needle-localized lumpectomy by Dr. Imogene Burn. Tsuei on 09/09/2013; pathology showed intraductal papilloma with no atypia or malignancy identified.   PONV (postoperative nausea and vomiting)    Pre-diabetes    pt denies   Urticaria    Uterine fibroid     Current Outpatient Medications on File Prior to Visit  Medication Sig Dispense Refill   acetaminophen (TYLENOL) 500 MG tablet Take 1,000 mg by  mouth every 6 (six) hours as needed for moderate pain.     APPLE CIDER VINEGAR PO Take 5 mLs by mouth daily.      atenolol (TENORMIN) 25 MG tablet TAKE 1 TABLET (25 MG TOTAL) BY MOUTH DAILY. 90 tablet 1   Cholecalciferol (VITAMIN D3) 50 MCG (2000 UT) TABS Take by mouth.     ELIQUIS 5 MG TABS tablet TAKE 1 TABLET BY MOUTH TWICE A DAY 180 tablet 1   EPINEPHrine (EPIPEN 2-PAK) 0.3 mg/0.3 mL IJ SOAJ injection Inject 0.3 mg into the muscle as needed for anaphylaxis. 1 each 2   levocetirizine (XYZAL) 5 MG tablet Can use up to 2 tablets in the morning and 2 tablets at night for hives/itching. This is maximum dose. Titrate as needed. 120 tablet 3   levothyroxine (SYNTHROID) 75 MCG tablet Take 1 tablet (75 mcg total) by mouth daily. 90 tablet 0   Multiple Vitamin (MULTIVITAMIN) capsule Take 1 capsule by mouth daily.     ondansetron (ZOFRAN) 4 MG tablet Take 1 tablet (4 mg total) by mouth every 8 (eight) hours as needed for nausea or vomiting. 20 tablet 0   pantoprazole (PROTONIX) 40 MG tablet Take 1 tablet (40 mg total) by mouth daily. 90 tablet 3   No current facility-administered medications on file prior to  visit.    Allergies  Allergen Reactions   Benicar Hct [Olmesartan Medoxomil-Hctz] Shortness Of Breath and Palpitations   Iodinated Contrast Media Hives and Itching    02/02/2021-  pt developed 2 hives and itching even with the 13hr premedication.  Radiology PA came into eval the pt.  Developed itching and hives after injection on 05/10/20; needs 13hr prep in future   Crestor [Rosuvastatin] Other (See Comments)    Muscle aches   Bactrim [Sulfamethoxazole-Trimethoprim] Other (See Comments)    Abdominal pain, dizziness   Pravachol [Pravastatin] Other (See Comments)    Lower abdominal pain   Shellfish Allergy Nausea And Vomiting   Amlodipine Palpitations    Assessment/Plan:  1. Hyperlipidemia - Patient most recent LDL 167 which is above goal of <70. Discussed pharmaceutical options for  patient since she is statin intolerant. Discussed mechanisms of action or ezetimibe and bempedoic acid however advised that these were not likely to get her to goal due to her atherosclerosis.  Recommended she consider PCSK9i.  Using repatha demo pen, educated patient on mechanism of action, storage, site selection, administration, and possible adverse effects. Patient voiced understanding and is willing to try.  Will complete PA and contact patient when approved.  Update lipid panel in 2-3 months. Patient wishes to have lab work drawn at PCP office.  Start Repatha '140mg'$  sq q 14 days Recheck lipid panel in 2-3 months  Karren Cobble, PharmD, [place], [place], [place], [place], [place], [address] Phone: [phone], Fax: [phone]

## 2022-08-01 NOTE — Telephone Encounter (Signed)
PA for Repatha submitted.  Key: [id number]

## 2022-08-01 NOTE — Patient Instructions (Addendum)
It was nice meeting you today  We would like your LDL (bad cholesterol) to be less than 70  I recommend starting a new medication called Repatha, which you will inject once every 2 weeks  I will complete the prior authorization for you and contact you when it is approved  Once you start the medication we would recheck your cholesterol levels in 2-3 months  Please call with any questions!  Karren Cobble, PharmD, BCACP, [employer], [place] [address] [place], [place] [address] Phone: [phone]; Fax: [phone] 08/01/2022 8:46 AM

## 2022-08-02 ENCOUNTER — Telehealth: Payer: Self-pay | Admitting: Internal Medicine

## 2022-08-02 NOTE — Telephone Encounter (Signed)
Patient states that she received a message through my chart that Dr. Ronnald Ramp was lowering her thyroid medication due to a change in her labs but when she picked up prescription it was still the same dosage.  Please advise.

## 2022-08-02 NOTE — Telephone Encounter (Signed)
Jasmine from [facility] states that PA submitted for Repatha was incomplete/incorrect and they have additional questions. She also stated that she faxed over some additional questions yesterday   Best call back - [phone]    Reference  : [id number]

## 2022-08-02 NOTE — Telephone Encounter (Signed)
Fax received. Questions answered and faxed back.

## 2022-08-03 NOTE — Telephone Encounter (Signed)
  Pt said, she got approved for repatha and [facility] need prescription to refill it

## 2022-08-06 MED ORDER — REPATHA SURECLICK 140 MG/ML ~~LOC~~ SOAJ: 1.0000 | SUBCUTANEOUS | 11 refills | Status: DC

## 2022-08-06 NOTE — Telephone Encounter (Signed)
Spoke with patient. [facility] not [facility]. [facility] on [address] per request.  Labs scheduled for 10/18.

## 2022-08-07 NOTE — Telephone Encounter (Signed)
LVM for pt to call back in regards her questions about medication dosage

## 2022-08-07 NOTE — Telephone Encounter (Signed)
Talked to pt, pt confirmed she has received the right dosage Dr. Ronnald Ramp prescribe which is the 23, and would be picking that up

## 2022-08-07 NOTE — Telephone Encounter (Signed)
Patient called back and is still concerned about the dosage of thyroid medication - it was supposed to be lowered per Dr. Ronnald Ramp, but the same strength was sent in to pharmacy.

## 2022-08-07 NOTE — Telephone Encounter (Signed)
Pt has stated she called the pharmacy and [facility] [place] has confirmed the dosage has changed to 75 instead of 100.

## 2022-08-07 NOTE — Telephone Encounter (Signed)
Contact pt to let them know about the medication dosage question, pt confirmed she went to wrong pharmacy that why they was no changes to the dosage sent, pt said she will call  the pharmacy that was sent to

## 2022-08-08 ENCOUNTER — Other Ambulatory Visit: Payer: Medicare Other

## 2022-08-08 ENCOUNTER — Telehealth: Payer: Self-pay | Admitting: Pharmacist

## 2022-08-08 NOTE — Telephone Encounter (Signed)
Called and lmom. Patient does not need to complete lab work until being on [place] for 2-3 months.

## 2022-08-08 NOTE — Telephone Encounter (Signed)
Patient called to check if she still needs to draw labs today as she had labs drawn on 07/11/22.  Patient requested call back to confirm.

## 2022-08-22 ENCOUNTER — Ambulatory Visit
Admission: RE | Admit: 2022-08-22 | Discharge: 2022-08-22 | Disposition: A | Discharge status: Home / Self Care | Payer: Medicare Other | Source: Ambulatory Visit | Attending: Internal Medicine | Admitting: Internal Medicine

## 2022-08-22 ENCOUNTER — Inpatient Hospital Stay: Payer: Medicare Other | Attending: Gynecologic Oncology

## 2022-08-22 ENCOUNTER — Other Ambulatory Visit: Payer: Self-pay

## 2022-08-22 DIAGNOSIS — Z1231 Encounter for screening mammogram for malignant neoplasm of breast: Secondary | ICD-10-CM | POA: Diagnosis not present

## 2022-08-22 DIAGNOSIS — C541 Malignant neoplasm of endometrium: Secondary | ICD-10-CM | POA: Insufficient documentation

## 2022-08-22 DIAGNOSIS — Z452 Encounter for adjustment and management of vascular access device: Secondary | ICD-10-CM | POA: Insufficient documentation

## 2022-08-22 MED ORDER — SODIUM CHLORIDE 0.9% FLUSH
10.0000 mL | Freq: Once | INTRAVENOUS | Status: AC
  Administered 2022-08-22: 10 mL

## 2022-08-22 MED ORDER — HEPARIN SOD (PORK) LOCK FLUSH 100 UNIT/ML IV SOLN
500.0000 [IU] | Freq: Once | INTRAVENOUS | Status: AC
  Administered 2022-08-22: 500 [IU]

## 2022-08-23 ENCOUNTER — Telehealth: Payer: Self-pay | Admitting: Internal Medicine

## 2022-08-24 ENCOUNTER — Ambulatory Visit (INDEPENDENT_AMBULATORY_CARE_PROVIDER_SITE_OTHER): Payer: Medicare Other | Admitting: Internal Medicine

## 2022-08-24 ENCOUNTER — Encounter: Payer: Self-pay | Admitting: Internal Medicine

## 2022-08-24 ENCOUNTER — Other Ambulatory Visit: Payer: Self-pay | Admitting: Internal Medicine

## 2022-08-24 ENCOUNTER — Ambulatory Visit (INDEPENDENT_AMBULATORY_CARE_PROVIDER_SITE_OTHER): Payer: Medicare Other | Admitting: Sports Medicine

## 2022-08-24 ENCOUNTER — Ambulatory Visit (INDEPENDENT_AMBULATORY_CARE_PROVIDER_SITE_OTHER): Payer: Medicare Other

## 2022-08-24 VITALS — BP 140/70 | HR 63 | Ht 64.0 in | Wt 212.0 lb

## 2022-08-24 VITALS — BP 140/70 | HR 63 | Temp 98.4°F | Ht 64.0 in | Wt 212.8 lb

## 2022-08-24 DIAGNOSIS — E039 Hypothyroidism, unspecified: Secondary | ICD-10-CM | POA: Diagnosis not present

## 2022-08-24 DIAGNOSIS — M7061 Trochanteric bursitis, right hip: Secondary | ICD-10-CM | POA: Diagnosis not present

## 2022-08-24 DIAGNOSIS — M25551 Pain in right hip: Secondary | ICD-10-CM

## 2022-08-24 DIAGNOSIS — I10 Essential (primary) hypertension: Secondary | ICD-10-CM | POA: Diagnosis not present

## 2022-08-24 DIAGNOSIS — M1611 Unilateral primary osteoarthritis, right hip: Secondary | ICD-10-CM | POA: Diagnosis not present

## 2022-08-24 LAB — T4, FREE: Free T4: 0.9 ng/dL (ref 0.60–1.60)

## 2022-08-24 LAB — TSH: TSH: 0.36 u[IU]/mL (ref 0.35–5.50)

## 2022-08-24 NOTE — Progress Notes (Unsigned)
Patient ID: Tina Stone, female   DOB: [birth year], 72 y.o.   MRN: [id number]        Chief Complaint: follow up HTN, HLD and hyperglycemia  and  Chief Complaint  Patient presents with   Medication Problem    Pt c/o that Thyroid meds causes rapid heart beat irritability sleeplessness aches and pains x 2 mths would like to make an adjustment           HPI:  Tina Stone is a 72 y.o. female here with c/o irritable and sleep difficulty, wondering if her thyroid med is correct dose.  Denies hyper or hypo thyroid symptoms such as voice, skin or hair change.  Pt denies chest pain, increased sob or doe, wheezing, orthopnea, PND, increased LE swelling, palpitations, dizziness or syncope.   Pt denies polydipsia, polyuria, or new focal neuro s/s.    Pt denies fever, wt loss, night sweats, loss of appetite, or other constitutional symptoms  BP has been < 140/90 Also has tender sore area to the right lateral hip area, but not owrse to lie on this at night.  No worsening low back pain or radicaulr leg pain        Wt Readings from Last 3 Encounters:  08/24/22 212 lb (96.2 kg)  08/24/22 212 lb 12.8 oz (96.5 kg)  07/12/22 211 lb (95.7 kg)   BP Readings from Last 3 Encounters:  08/24/22 (!) 140/70  08/24/22 (!) 140/70  07/12/22 132/78         Past Medical History:  Diagnosis Date   Adenomatous colon polyp    Allergic rhinitis, seasonal    Allergy    Atrial fibrillation ([place])    Beta thalassemia trait 11/25/2013   Cholelithiasis    Class 3 obesity without serious comorbidity with body mass index (BMI) of 40.0 to 44.9 in adult 11/19/2012   endometrial ca dx'd 08/2009   endometrial    GERD (gastroesophageal reflux disease)    H. pylori infection    History of radiation therapy    endometrial - 02/19/2022-03/23/2022 Dr Gery Pray   HLD (hyperlipidemia)    Hypercholesterolemia    Hypertension 03/18/2017   no meds    Hypothyroidism    Intraductal papilloma of left breast    Patient  underwent left needle-localized lumpectomy by Dr. Imogene Burn. Tsuei on 09/09/2013; pathology showed intraductal papilloma with no atypia or malignancy identified.   PONV (postoperative nausea and vomiting)    Pre-diabetes    pt denies   Urticaria    Uterine fibroid    Past Surgical History:  Procedure Laterality Date   ANKLE FRACTURE SURGERY Right 1966   right   BREAST EXCISIONAL BIOPSY Left 2014   benign   BREAST LUMPECTOMY WITH NEEDLE LOCALIZATION Left 09/09/2013   Procedure: BREAST LUMPECTOMY WITH NEEDLE LOCALIZATION;  Surgeon: Imogene Burn. Georgette Dover, MD;  Location: [facility];  Service: General;  Laterality: Left;   CHOLECYSTECTOMY     COLONOSCOPY     IR IMAGING GUIDED PORT INSERTION  05/18/2020   POLYPECTOMY     ROBOTIC ASSISTED LAPAROSCOPIC CHOLECYSTECTOMY  09/09/2019   ROBOTIC ASSISTED TOTAL HYSTERECTOMY WITH BILATERAL SALPINGO OOPHERECTOMY N/A 10/22/2019   Procedure: XI ROBOTIC ASSISTED TOTAL HYSTERECTOMY WITH BILATERAL SALPINGO OOPHORECTOMY GREATER THAN 250 GRAMS, MINI LAPARTOMY FOR SPECIMEN DELIVERY; PELVIC AND PERI-AORTIC LYMPHADENECTOMY;  Surgeon: Everitt Amber, MD;  Location: [facility];  Service: Gynecology;  Laterality: N/A;   SENTINEL NODE BIOPSY N/A 10/22/2019   Procedure: SENTINEL NODE BIOPSY;  Surgeon: Denman George,  Terrence Dupont, MD;  Location: [facility];  Service: Gynecology;  Laterality: N/A;    reports that she has never smoked. She has never used smokeless tobacco. She reports that she does not drink alcohol and does not use drugs. family history includes Asthma in her brother and paternal grandmother; Colon polyps (age of onset: 19) in her mother; Congestive Heart Failure in her father; Dementia (age of onset: 60) in her mother; Diabetes in her brother, father, maternal grandmother, mother, and paternal grandfather; Hypertension in her brother, brother, maternal grandmother, and mother; Lung disease in her paternal grandmother; Pancreatic cancer in her paternal aunt; Uterine cancer in her paternal  aunt. Allergies  Allergen Reactions   Benicar Hct [Olmesartan Medoxomil-Hctz] Shortness Of Breath and Palpitations   Iodinated Contrast Media Hives and Itching    02/02/2021-  pt developed 2 hives and itching even with the 13hr premedication.  Radiology PA came into eval the pt.  Developed itching and hives after injection on 05/10/20; needs 13hr prep in future   Crestor [Rosuvastatin] Other (See Comments)    Muscle aches   Bactrim [Sulfamethoxazole-Trimethoprim] Other (See Comments)    Abdominal pain, dizziness   Pravachol [Pravastatin] Other (See Comments)    Lower abdominal pain   Shellfish Allergy Nausea And Vomiting   Amlodipine Palpitations   Current Outpatient Medications on File Prior to Visit  Medication Sig Dispense Refill   acetaminophen (TYLENOL) 500 MG tablet Take 1,000 mg by mouth every 6 (six) hours as needed for moderate pain.     APPLE CIDER VINEGAR PO Take 5 mLs by mouth daily.      atenolol (TENORMIN) 25 MG tablet TAKE 1 TABLET (25 MG TOTAL) BY MOUTH DAILY. 90 tablet 1   Cholecalciferol (VITAMIN D3) 50 MCG (2000 UT) TABS Take by mouth.     ELIQUIS 5 MG TABS tablet TAKE 1 TABLET BY MOUTH TWICE A DAY 180 tablet 1   EPINEPHrine (EPIPEN 2-PAK) 0.3 mg/0.3 mL IJ SOAJ injection Inject 0.3 mg into the muscle as needed for anaphylaxis. 1 each 2   Evolocumab (REPATHA SURECLICK) 338 MG/ML SOAJ Inject 1 Pen into the skin every 14 (fourteen) days. 2 mL 11   levocetirizine (XYZAL) 5 MG tablet Can use up to 2 tablets in the morning and 2 tablets at night for hives/itching. This is maximum dose. Titrate as needed. 120 tablet 3   levothyroxine (SYNTHROID) 75 MCG tablet Take 1 tablet (75 mcg total) by mouth daily. 90 tablet 0   Multiple Vitamin (MULTIVITAMIN) capsule Take 1 capsule by mouth daily.     ondansetron (ZOFRAN) 4 MG tablet Take 1 tablet (4 mg total) by mouth every 8 (eight) hours as needed for nausea or vomiting. 20 tablet 0   pantoprazole (PROTONIX) 40 MG tablet Take 1 tablet  (40 mg total) by mouth daily. 90 tablet 3   No current facility-administered medications on file prior to visit.        ROS:  All others reviewed and negative.  Objective        PE:  BP (!) 140/70 (BP Location: Right Arm, Patient Position: Sitting, Cuff Size: Normal)   Pulse 63   Temp 98.4 F (36.9 C) (Oral)   Ht '5\' 4"'$  (1.626 m)   Wt 212 lb 12.8 oz (96.5 kg)   SpO2 96%   BMI 36.53 kg/m                 Constitutional: Pt appears in NAD  HENT: Head: NCAT.                Right Ear: External ear normal.                 Left Ear: External ear normal.                Eyes: . Pupils are equal, round, and reactive to light. Conjunctivae and EOM are normal               Nose: without d/c or deformity               Neck: Neck supple. Gross normal ROM               Cardiovascular: Normal rate and regular rhythm.                 Pulmonary/Chest: Effort normal and breath sounds without rales or wheezing.                Abd:  Soft, NT, ND, + BS, no organomegaly               Neurological: Pt is alert. At baseline orientation, motor grossly intact               Skin: Skin is warm. No rashes, no other new lesions, LE edema - none, has tender over right greater trochanter               Psychiatric: Pt behavior is normal without agitation   Micro: none  Cardiac tracings I have personally interpreted today:  none  Pertinent Radiological findings (summarize): none   Lab Results  Component Value Date   WBC 4.4 06/20/2022   HGB 12.1 06/20/2022   HCT 36.8 06/20/2022   PLT 204 06/20/2022   GLUCOSE 101 (H) 06/20/2022   CHOL 247 (H) 07/11/2022   TRIG 67 07/11/2022   HDL 69 07/11/2022   LDLCALC 167 (H) 07/11/2022   ALT 10 06/20/2022   AST 15 06/20/2022   NA 139 06/20/2022   K 4.0 06/20/2022   CL 103 06/20/2022   CREATININE 0.79 06/20/2022   BUN 11 06/20/2022   CO2 29 06/20/2022   TSH 0.36 08/24/2022   INR 1.0 01/28/2021   HGBA1C 5.3 02/02/2022   Assessment/Plan:   Tina Stone is a 72 y.o. Black or African American [2] female with  has a past medical history of Adenomatous colon polyp, Allergic rhinitis, seasonal, Allergy, Atrial fibrillation ([place]), Beta thalassemia trait (11/25/2013), Cholelithiasis, Class 3 obesity without serious comorbidity with body mass index (BMI) of 40.0 to 44.9 in adult (11/19/2012), endometrial ca (dx'd 08/2009), GERD (gastroesophageal reflux disease), H. pylori infection, History of radiation therapy, HLD (hyperlipidemia), Hypercholesterolemia, Hypertension (03/18/2017), Hypothyroidism, Intraductal papilloma of left breast, PONV (postoperative nausea and vomiting), Pre-diabetes, Urticaria, and Uterine fibroid.  Acquired hypothyroidism Lab Results  Component Value Date   TSH 0.08 (L) 07/09/2022   T4TOTAL 9.0 01/25/2021   Now on less levothyroxine - for f/u TFTs today, cont 75 mcg qd  Essential hypertension BP Readings from Last 3 Encounters:  08/24/22 (!) 140/70  08/24/22 (!) 140/70  07/12/22 132/78   uncontrolled, pt to continue medical treatment genormin 25 mg qd, declines change for now   Right hip pain ? Bursitis vs other - for xray, refer sports medicine for possible cortisone  Followup: Return if symptoms worsen or fail to improve.  Cathlean Cower, MD 08/28/2022 9:41 PM [facility]

## 2022-08-24 NOTE — Progress Notes (Signed)
Benito Mccreedy D.[place] [facility] Phone: [phone]   Assessment and Plan:     1. Acute right hip pain 2. Greater trochanteric bursitis of right hip -Acute, uncomplicated, initial sports medicine visit - Most consistent with greater trochanteric bursitis with patient experiencing lateral hip discomfort and catching starting this morning - Start HEP and physical therapy for right hip - May use topical Voltaren gel 1-2 times per day over areas of pain as needed - X-ray obtained in clinic.  My interpretation: No acute fracture or dislocation.  Mildly decreased joint space and cortical changes consistent with mild osteoarthritis of right hip greater than left hip.  Will await official radiology read - Ambulatory referral to Physical Therapy  -Discussed options such as 2 to 3-week course of prescription NSAIDs and greater trochanteric CSI, however due to acute nature and mild symptoms at this time, and patient's comorbidities, patient agreed to start with Voltaren gel and HEP/PT for initial treatment course  Pertinent previous records reviewed include none   Follow Up: 3 to 4 weeks for reevaluation.  Could consider course of NSAIDs versus greater trochanteric CSI based on patient's symptoms   Subjective:   I, [name], am serving as a Education administrator for Doctor Glennon Mac  Chief Complaint: right hip pain   HPI:   08/24/22 Patient is a 72 year old female complaining of right hip pain. Patient states that it feels like  catch every time she walks no pain when she sits just a catch and pain when she walks stays she had the same feeling on her left side that turned out to be cancer in the lymph node , catching feeling started this morning ,   Relevant Historical Information: History of endometrial cancer, hypertension, GERD  Additional pertinent review of systems negative.   Current Outpatient Medications:     acetaminophen (TYLENOL) 500 MG tablet, Take 1,000 mg by mouth every 6 (six) hours as needed for moderate pain., Disp: , Rfl:    APPLE CIDER VINEGAR PO, Take 5 mLs by mouth daily. , Disp: , Rfl:    atenolol (TENORMIN) 25 MG tablet, TAKE 1 TABLET (25 MG TOTAL) BY MOUTH DAILY., Disp: 90 tablet, Rfl: 1   Cholecalciferol (VITAMIN D3) 50 MCG (2000 UT) TABS, Take by mouth., Disp: , Rfl:    ELIQUIS 5 MG TABS tablet, TAKE 1 TABLET BY MOUTH TWICE A DAY, Disp: 180 tablet, Rfl: 1   EPINEPHrine (EPIPEN 2-PAK) 0.3 mg/0.3 mL IJ SOAJ injection, Inject 0.3 mg into the muscle as needed for anaphylaxis., Disp: 1 each, Rfl: 2   Evolocumab (REPATHA SURECLICK) 098 MG/ML SOAJ, Inject 1 Pen into the skin every 14 (fourteen) days., Disp: 2 mL, Rfl: 11   levocetirizine (XYZAL) 5 MG tablet, Can use up to 2 tablets in the morning and 2 tablets at night for hives/itching. This is maximum dose. Titrate as needed., Disp: 120 tablet, Rfl: 3   levothyroxine (SYNTHROID) 75 MCG tablet, Take 1 tablet (75 mcg total) by mouth daily., Disp: 90 tablet, Rfl: 0   Multiple Vitamin (MULTIVITAMIN) capsule, Take 1 capsule by mouth daily., Disp: , Rfl:    ondansetron (ZOFRAN) 4 MG tablet, Take 1 tablet (4 mg total) by mouth every 8 (eight) hours as needed for nausea or vomiting., Disp: 20 tablet, Rfl: 0   pantoprazole (PROTONIX) 40 MG tablet, Take 1 tablet (40 mg total) by mouth daily., Disp: 90 tablet, Rfl: 3   Objective:  Vitals:   08/24/22 0856  BP: (!) 140/70  Pulse: 63  SpO2: 96%  Weight: 212 lb (96.2 kg)  Height: '5\' 4"'$  (1.626 m)      Body mass index is 36.39 kg/m.    Physical Exam:    General: awake, alert, and oriented no acute distress, nontoxic Skin: no suspicious lesions or rashes Neuro:sensation intact distally with no dificits, normal muscle tone, no atrophy, strength 5/5 in all tested lower ext groups Psych: normal mood and affect, speech clear  Right hip: No deformity, swelling or wasting ROM Flexion 90, ext  30, IR 45, ER 45 TTP greater trochanteric NTTP over the hip flexors,  glute musculature, si joint, lumbar spine Negative log roll with FROM Negative FABER Negative FADIR Negative Piriformis test Negative trendelenberg Gait normal    Electronically signed by:  Benito Mccreedy D.[facility] 9:24 AM 08/24/22

## 2022-08-24 NOTE — Addendum Note (Signed)
Addended by: Glennon Mac on: 08/24/2022 09:26 AM   Modules accepted: Level of Service

## 2022-08-24 NOTE — Assessment & Plan Note (Signed)
Lab Results  Component Value Date   TSH 0.08 (L) 07/09/2022   T4TOTAL 9.0 01/25/2021   Now on less levothyroxine - for f/u TFTs today, cont 75 mcg qd

## 2022-08-24 NOTE — Patient Instructions (Addendum)
Please continue all other medications as before, and refills have been done if requested.  Please have the pharmacy call with any other refills you may need.  Please continue your efforts at being more active, low cholesterol diet, and weight control.  Please keep your appointments with your specialists as you may have planned - oncology  You will be contacted regarding the referral for: Sports Medicine on the first floor  Please go to the LAB at the blood drawing area for the tests to be done  You will be contacted by phone if any changes need to be made immediately.  Otherwise, you will receive a letter about your results with an explanation, but please check with MyChart first.  Please remember to sign up for MyChart if you have not done so, as this will be important to you in the future with finding out test results, communicating by private email, and scheduling acute appointments online when needed.

## 2022-08-24 NOTE — Patient Instructions (Addendum)
Good to see you  Recommend using Voltaren gel over painful areas 1-2 times a day as needed Hip HEP Pt referral  3-4 week follow up

## 2022-08-28 ENCOUNTER — Encounter: Payer: Self-pay | Admitting: Internal Medicine

## 2022-08-28 DIAGNOSIS — M25551 Pain in right hip: Secondary | ICD-10-CM | POA: Insufficient documentation

## 2022-08-28 NOTE — Assessment & Plan Note (Signed)
?   Bursitis vs other - for xray, refer sports medicine for possible cortisone

## 2022-08-28 NOTE — Assessment & Plan Note (Signed)
BP Readings from Last 3 Encounters:  08/24/22 (!) 140/70  08/24/22 (!) 140/70  07/12/22 132/78   uncontrolled, pt to continue medical treatment genormin 25 mg qd, declines change for now

## 2022-08-30 ENCOUNTER — Telehealth: Payer: Self-pay

## 2022-08-30 NOTE — Telephone Encounter (Signed)
She has Hx atrial fibrillation before I recommend she contacts her cardiologist based on her symptoms

## 2022-08-30 NOTE — Telephone Encounter (Signed)
Called and left below message to contact cardiologist regarding symptoms. Ask her to call the office back for questions.

## 2022-08-30 NOTE — Telephone Encounter (Signed)
Returned her call. She has been having shortness of breath at times with exertion and rest. She has been having this for some time. She feels at times that she has a rapid heartbeat. She saw PCP and thyroid checked recently. At times she has GERD symptoms with over eating and just elevate head of bed. She wonders if it could be her port, no other symptoms with the port.  She is asking what Dr. Alvy Bimler suggest?

## 2022-09-07 ENCOUNTER — Encounter: Payer: Self-pay | Admitting: Physical Therapy

## 2022-09-07 ENCOUNTER — Other Ambulatory Visit: Payer: Self-pay

## 2022-09-07 ENCOUNTER — Ambulatory Visit: Payer: Medicare Other | Attending: Sports Medicine | Admitting: Physical Therapy

## 2022-09-07 DIAGNOSIS — M25551 Pain in right hip: Secondary | ICD-10-CM | POA: Insufficient documentation

## 2022-09-07 DIAGNOSIS — M7061 Trochanteric bursitis, right hip: Secondary | ICD-10-CM | POA: Diagnosis not present

## 2022-09-07 NOTE — Therapy (Signed)
OUTPATIENT PHYSICAL THERAPY LOWER EXTREMITY EVALUATION   Patient Name: Tina Stone MRN: [id number] DOB:1950/12/29, 72 y.o., female Today's Date: 09/07/2022   PT End of Session - 09/07/22 1014     Visit Number 1    Number of Visits 1    PT Start Time 0915    PT Stop Time 0952    PT Time Calculation (min) 37 min             Past Medical History:  Diagnosis Date   Adenomatous colon polyp    Allergic rhinitis, seasonal    Allergy    Atrial fibrillation ([place])    Beta thalassemia trait 11/25/2013   Cholelithiasis    Class 3 obesity without serious comorbidity with body mass index (BMI) of 40.0 to 44.9 in adult 11/19/2012   endometrial ca dx'd 08/2009   endometrial    GERD (gastroesophageal reflux disease)    H. pylori infection    History of radiation therapy    endometrial - 02/19/2022-03/23/2022 Dr Gery Pray   HLD (hyperlipidemia)    Hypercholesterolemia    Hypertension 03/18/2017   no meds    Hypothyroidism    Intraductal papilloma of left breast    Patient underwent left needle-localized lumpectomy by Dr. Imogene Burn. Tsuei on 09/09/2013; pathology showed intraductal papilloma with no atypia or malignancy identified.   PONV (postoperative nausea and vomiting)    Pre-diabetes    pt denies   Urticaria    Uterine fibroid    Past Surgical History:  Procedure Laterality Date   ANKLE FRACTURE SURGERY Right 1966   right   BREAST EXCISIONAL BIOPSY Left 2014   benign   BREAST LUMPECTOMY WITH NEEDLE LOCALIZATION Left 09/09/2013   Procedure: BREAST LUMPECTOMY WITH NEEDLE LOCALIZATION;  Surgeon: Imogene Burn. Georgette Dover, MD;  Location: [facility];  Service: General;  Laterality: Left;   CHOLECYSTECTOMY     COLONOSCOPY     IR IMAGING GUIDED PORT INSERTION  05/18/2020   POLYPECTOMY     ROBOTIC ASSISTED LAPAROSCOPIC CHOLECYSTECTOMY  09/09/2019   ROBOTIC ASSISTED TOTAL HYSTERECTOMY WITH BILATERAL SALPINGO OOPHERECTOMY N/A 10/22/2019   Procedure: XI ROBOTIC ASSISTED TOTAL  HYSTERECTOMY WITH BILATERAL SALPINGO OOPHORECTOMY GREATER THAN 250 GRAMS, MINI LAPARTOMY FOR SPECIMEN DELIVERY; PELVIC AND PERI-AORTIC LYMPHADENECTOMY;  Surgeon: Everitt Amber, MD;  Location[facility];  Service: Gynecology;  Laterality: N/A;   SENTINEL NODE BIOPSY N/A 10/22/2019   Procedure: SENTINEL NODE BIOPSY;  Surgeon: Everitt Amber, MD;  Location[facility];  Service: Gynecology;  Laterality: N/A;   Patient Active Problem List   Diagnosis Date Noted   Right hip pain 08/28/2022   Aortic atherosclerosis ([place]) 08/01/2022   Chronic idiopathic urticaria 11/22/2021   Chronic anticoagulation 10/02/2021   Visit for screening mammogram 08/18/2021   Acquired hypothyroidism 08/18/2021   Allergic rhinoconjunctivitis 08/18/2021   Encounter for general adult medical examination with abnormal findings 08/18/2021   Hyperlipidemia with target LDL less than 100 08/17/2021   Laryngitis, chronic 08/17/2021   Paroxysmal atrial fibrillation ([place]) 02/03/2021   Secondary hypercoagulable state ([place]) 02/03/2021   Essential hypertension 11/16/2020   Vitamin D deficiency 10/27/2020   Endometrial cancer ([place]) 10/22/2019   Beta thalassemia trait 11/25/2013   Class II obesity 11/19/2012   Gastroesophageal reflux disease 01/28/2007    PCP: [name], MD  REFERRING PROVIDER: Glennon Mac, DO  THERAPY DIAG:  Acute right hip pain  Greater trochanteric bursitis of right hip  REFERRING DIAG: Acute right hip pain [M25.551], Greater trochanteric bursitis of right hip [  M70.61]  Rationale for Evaluation and Treatment Rehabilitation  SUBJECTIVE:  PERTINENT PAST HISTORY:  Endometrial cancer (2020), AFIB      PRECAUTIONS: None  WEIGHT BEARING RESTRICTIONS No  FALLS:  Has patient fallen in last 6 months? No, Number of falls: 0  MOI/History of condition:  Onset date: ~ 1 month ago  Tina Stone is a 72 y.o. female who presents to clinic with chief complaint of R lateral hip pain which started  about 3 weeks ago.  The hip pain resolved over a few days and is no longer present.  She was worried that this might be related to cancer, as she had a similar pain in her L hip when she was dxd in 2020.  She is no longer concerned with her R hip pain.  She does have some bil knee pain which limits her transfers to a certain extent.    Red flags:  denies   Pain:  Are you having pain? Yes Pain location: bil knee pain L>R  NPRS scale:  current 1/10  average 3/10    Patient Goals/Specific Activities: create HEP, stretches   OBJECTIVE:   DIAGNOSTIC FINDINGS:  IMPRESSION: 1.  No acute fracture or dislocation.   2. Mild right hip osteoarthritis and enthesopathy at the insertion of the gluteus medius/minimus. No suspicious osseous lesion   GENERAL OBSERVATION/GAIT:   Slight limitation in knee ext bil in gait  SENSATION:  Light touch: Appears intact  PALPATION: TTP bil HS insertion  MUSCLE LENGTH: Hamstrings: Right subtle restriction; Left subtle restriction ASLR: Right ASLR = PSLR; Left ASLR = PSLR Thomas test: Right subtle restriction; Left subtle restriction Ely's test: Right significant restriction; Left significant restriction  LE MMT:  MMT Right 09/07/2022 Left 09/07/2022  Hip flexion (L2, L3) 4 3+  Knee extension (L3) 3+ 3+  Knee flexion 3+ 3+*  Hip abduction 3 3+  Hip extension 3+ 3+  Hip external rotation    Hip internal rotation    Hip adduction    Ankle dorsiflexion (L4)    Ankle plantarflexion (S1)    Ankle inversion    Ankle eversion    Great Toe ext (L5)    Grossly     (Blank rows = not tested, score listed is out of 5 possible points.  N = WNL, D = diminished, C = clear for gross weakness with myotome testing, * = concordant pain with testing)  LE ROM:  ROM Right 09/07/2022 Left 09/07/2022  Hip flexion N N  Hip extension Slight limitation, no pain Slight limitation, no pain  Hip abduction    Hip adduction    Hip internal rotation    Hip external  rotation    Knee flexion N N  Knee extension 5* 7*  Ankle dorsiflexion    Ankle plantarflexion    Ankle inversion    Ankle eversion     (Blank rows = not tested, N = WNL, * = concordant pain with testing)  Functional Tests  Eval (09/07/2022)    30'' STS: 12x  UE used? Y, Unable w/o UE support    Progressive balance screen (highest level completed for >/= 10''):  Feet together: 10'' Semi Tandem: R in rear 10'', L in rear 10'' Tandem: R in rear 10'', L in rear 10'' SLS: R 10'', L 3''  TODAY'S TREATMENT: Creating, reviewing, and completing below HEP   PATIENT EDUCATION:  POC, diagnosis, prognosis, HEP, and outcome measures.  Pt educated via explanation, demonstration, and handout (HEP).  Pt confirms understanding verbally.   HOME EXERCISE PROGRAM: Access Code: [email or web address] URL: [email or web address] Date: 09/07/2022 Prepared by: Shearon Balo  Exercises - Hooklying Isometric Clamshell  - 1 x daily - 7 x weekly - 3 sets - 10 reps - Long Sitting Quad Set with Towel Roll Under Heel  - 1 x daily - 7 x weekly - 3 sets - 10 reps - Bridge  - 1 x daily - 7 x weekly - 3 sets - 10 reps - 5-10'' hold - Single Leg Stance  - 2 x daily - 6 x weekly - 1 sets - 3 reps - 30 hold  ASSESSMENT:  CLINICAL IMPRESSION: Tina Stone is a 72 y.o. female who presents to clinic with signs and sxs consistent with bil knee pain and weakness.  Her R hip pain is completely resolved.  She reports that she was concerned about the pain because it mimicked the pain she had prior to her Cx diagnosis in 2020 in her L hip.  However, the R hip pain came on fairly suddenly and resolved within days.  There appears to be no significant hip pathology on exam.  She does lack full knee ext L>R bil with TTP bil HS insertion.  She also has significant general weakness in bil hips and LE.  She has recently resumed aquatic therapy.  I issued her an HEP  and she will work on some specific strengthening in combination with her aquatic therapy.  I don't think there is a need for follow up visits, particularly concerning her hip; she agrees.  OBJECTIVE IMPAIRMENTS: Pain, LE and hip strength, knee ROM  ACTIVITY LIMITATIONS: transfers  PERSONAL FACTORS: See medical history and pertinent history   REHAB POTENTIAL: Good  CLINICAL DECISION MAKING: Stable/uncomplicated  EVALUATION COMPLEXITY: Low    PLAN: PT FREQUENCY: 1x visit   Shearon Balo PT, DPT 09/07/2022, 10:18 AM

## 2022-09-13 NOTE — Progress Notes (Deleted)
    Benito Mccreedy D.[place] Phone: [phone]   Assessment and Plan:     There are no diagnoses linked to this encounter.  ***   Pertinent previous records reviewed include ***   Follow Up: ***     Subjective:   I, Leontyne Manville, am serving as a Education administrator for Doctor Glennon Mac   Chief Complaint: right hip pain    HPI:    08/24/22 Patient is a 72 year old female complaining of right hip pain. 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   Current Outpatient Medications:    acetaminophen (TYLENOL) 500 MG tablet, Take 1,000 mg by mouth every 6 (six) hours as needed for moderate pain., Disp: , Rfl:    APPLE CIDER VINEGAR PO, Take 5 mLs by mouth daily. , Disp: , Rfl:    atenolol (TENORMIN) 25 MG tablet, TAKE 1 TABLET (25 MG TOTAL) BY MOUTH DAILY., Disp: 90 tablet, Rfl: 1   Cholecalciferol (VITAMIN D3) 50 MCG (2000 UT) TABS, Take by mouth., Disp: , Rfl:    ELIQUIS 5 MG TABS tablet, TAKE 1 TABLET BY MOUTH TWICE A DAY, Disp: 180 tablet, Rfl: 1   EPINEPHrine (EPIPEN 2-PAK) 0.3 mg/0.3 mL IJ SOAJ injection, Inject 0.3 mg into the muscle as needed for anaphylaxis., Disp: 1 each, Rfl: 2   Evolocumab (REPATHA SURECLICK) 109 MG/ML SOAJ, Inject 1 Pen into the skin every 14 (fourteen) days., Disp: 2 mL, Rfl: 11   levocetirizine (XYZAL) 5 MG tablet, Can use up to 2 tablets in the morning and 2 tablets at night for hives/itching. This is maximum dose. Titrate as needed., Disp: 120 tablet, Rfl: 3   levothyroxine (SYNTHROID) 75 MCG tablet, Take 1  tablet (75 mcg total) by mouth daily., Disp: 90 tablet, Rfl: 0   Multiple Vitamin (MULTIVITAMIN) capsule, Take 1 capsule by mouth daily., Disp: , Rfl:    ondansetron (ZOFRAN) 4 MG tablet, Take 1 tablet (4 mg total) by mouth every 8 (eight) hours as needed for nausea or vomiting., Disp: 20 tablet, Rfl: 0   pantoprazole (PROTONIX) 40 MG tablet, Take 1 tablet (40 mg total) by mouth daily., Disp: 90 tablet, Rfl: 3   Objective:     There were no vitals filed for this visit.    There is no height or weight on file to calculate BMI.    Physical Exam:    ***   Electronically signed by:  Benito Mccreedy D.[facility] 10:24 AM 09/13/22

## 2022-09-14 ENCOUNTER — Ambulatory Visit: Payer: Medicare Other | Admitting: Sports Medicine

## 2022-09-14 ENCOUNTER — Ambulatory Visit (INDEPENDENT_AMBULATORY_CARE_PROVIDER_SITE_OTHER): Payer: Medicare Other | Admitting: Internal Medicine

## 2022-09-14 VITALS — BP 136/72 | HR 62 | Temp 98.5°F | Ht 64.0 in | Wt 215.0 lb

## 2022-09-14 DIAGNOSIS — J309 Allergic rhinitis, unspecified: Secondary | ICD-10-CM

## 2022-09-14 DIAGNOSIS — I10 Essential (primary) hypertension: Secondary | ICD-10-CM | POA: Diagnosis not present

## 2022-09-14 DIAGNOSIS — Z20822 Contact with and (suspected) exposure to covid-19: Secondary | ICD-10-CM | POA: Diagnosis not present

## 2022-09-14 DIAGNOSIS — Z23 Encounter for immunization: Secondary | ICD-10-CM | POA: Diagnosis not present

## 2022-09-14 DIAGNOSIS — H1010 Acute atopic conjunctivitis, unspecified eye: Secondary | ICD-10-CM

## 2022-09-14 DIAGNOSIS — E559 Vitamin D deficiency, unspecified: Secondary | ICD-10-CM | POA: Diagnosis not present

## 2022-09-14 LAB — POC SOFIA SARS ANTIGEN FIA: SARS Coronavirus 2 Ag: NEGATIVE

## 2022-09-14 NOTE — Patient Instructions (Signed)
Your covid test was negative  You had the flu shot today  Ok to take the OTC Allegra, and Nasacort for allergies  You can also take Delsym OTC for cough, and/or Mucinex (or it's generic off brand) for congestion, and tylenol as needed for pain.  Please continue all other medications as before, and refills have been done if requested.  Please have the pharmacy call with any other refills you may need.  Please continue your efforts at being more active, low cholesterol diet, and weight control.  Please keep your appointments with your specialists as you may have planned

## 2022-09-14 NOTE — Progress Notes (Unsigned)
Chief Complaint: follow up sinus symptoms       HPI:  Tina Stone is a 72 y.o. female here with Does have several wks ongoing nasal allergy symptoms with clearish congestion, itch and sneezing and ST, congestion, , without fever, pain, cough, swelling or wheezing.  No high fever, chills, was exposed to covid x 5 days ago and symptoms seemed worse after found out 1 day later.   Pt denies polydipsia, polyuria, or new focal neuro s/s.   Due for flu shot  Taking Vit D       Wt Readings from Last 3 Encounters:  09/14/22 215 lb (97.5 kg)  08/24/22 212 lb (96.2 kg)  08/24/22 212 lb 12.8 oz (96.5 kg)   BP Readings from Last 3 Encounters:  09/14/22 136/72  08/24/22 (!) 140/70  08/24/22 (!) 140/70         Past Medical History:  Diagnosis Date   Adenomatous colon polyp    Allergic rhinitis, seasonal    Allergy    Atrial fibrillation ([place])    Beta thalassemia trait 11/25/2013   Cholelithiasis    Class 3 obesity without serious comorbidity with body mass index (BMI) of 40.0 to 44.9 in adult 11/19/2012   endometrial ca dx'd 08/2009   endometrial    GERD (gastroesophageal reflux disease)    H. pylori infection    History of radiation therapy    endometrial - 02/19/2022-03/23/2022 Dr Gery Pray   HLD (hyperlipidemia)    Hypercholesterolemia    Hypertension 03/18/2017   no meds    Hypothyroidism    Intraductal papilloma of left breast    Patient underwent left needle-localized lumpectomy by Dr. Imogene Burn. Tsuei on 09/09/2013; pathology showed intraductal papilloma with no atypia or malignancy identified.   PONV (postoperative nausea and vomiting)    Pre-diabetes    pt denies   Urticaria    Uterine fibroid    Past Surgical History:  Procedure Laterality Date   ANKLE FRACTURE SURGERY Right 1966   right   BREAST EXCISIONAL BIOPSY Left 2014   benign   BREAST LUMPECTOMY WITH NEEDLE LOCALIZATION Left 09/09/2013   Procedure: BREAST LUMPECTOMY WITH NEEDLE LOCALIZATION;  Surgeon:  Imogene Burn. Georgette Dover, MD;  Location: [facility];  Service: General;  Laterality: Left;   CHOLECYSTECTOMY     COLONOSCOPY     IR IMAGING GUIDED PORT INSERTION  05/18/2020   POLYPECTOMY     ROBOTIC ASSISTED LAPAROSCOPIC CHOLECYSTECTOMY  09/09/2019   ROBOTIC ASSISTED TOTAL HYSTERECTOMY WITH BILATERAL SALPINGO OOPHERECTOMY N/A 10/22/2019   Procedure: XI ROBOTIC ASSISTED TOTAL HYSTERECTOMY WITH BILATERAL SALPINGO OOPHORECTOMY GREATER THAN 250 GRAMS, MINI LAPARTOMY FOR SPECIMEN DELIVERY; PELVIC AND PERI-AORTIC LYMPHADENECTOMY;  Surgeon: Everitt Amber, MD;  Location: [facility];  Service: Gynecology;  Laterality: N/A;   SENTINEL NODE BIOPSY N/A 10/22/2019   Procedure: SENTINEL NODE BIOPSY;  Surgeon: Everitt Amber, MD;  Location: [facility];  Service: Gynecology;  Laterality: N/A;    reports that she has never smoked. She has never used smokeless tobacco. She reports that she does not drink alcohol and does not use drugs. family history includes Asthma in her brother and paternal grandmother; Colon polyps (age of onset: 10) in her mother; Congestive Heart Failure in her father; Dementia (age of onset: 45) in her mother; Diabetes in her brother, father, maternal grandmother, mother, and paternal grandfather; Hypertension in her brother, brother, maternal grandmother, and mother; Lung disease in her paternal grandmother; Pancreatic cancer in her paternal aunt;  Uterine cancer in her paternal aunt. Allergies  Allergen Reactions   Benicar Hct [Olmesartan Medoxomil-Hctz] Shortness Of Breath and Palpitations   Iodinated Contrast Media Hives and Itching    02/02/2021-  pt developed 2 hives and itching even with the 13hr premedication.  Radiology PA came into eval the pt.  Developed itching and hives after injection on 05/10/20; needs 13hr prep in future   Crestor [Rosuvastatin] Other (See Comments)    Muscle aches   Bactrim [Sulfamethoxazole-Trimethoprim] Other (See Comments)    Abdominal pain, dizziness   Pravachol [Pravastatin]  Other (See Comments)    Lower abdominal pain   Shellfish Allergy Nausea And Vomiting   Amlodipine Palpitations   Current Outpatient Medications on File Prior to Visit  Medication Sig Dispense Refill   acetaminophen (TYLENOL) 500 MG tablet Take 1,000 mg by mouth every 6 (six) hours as needed for moderate pain.     APPLE CIDER VINEGAR PO Take 5 mLs by mouth daily.      atenolol (TENORMIN) 25 MG tablet TAKE 1 TABLET (25 MG TOTAL) BY MOUTH DAILY. 90 tablet 1   Cholecalciferol (VITAMIN D3) 50 MCG (2000 UT) TABS Take by mouth.     ELIQUIS 5 MG TABS tablet TAKE 1 TABLET BY MOUTH TWICE A DAY 180 tablet 1   EPINEPHrine (EPIPEN 2-PAK) 0.3 mg/0.3 mL IJ SOAJ injection Inject 0.3 mg into the muscle as needed for anaphylaxis. 1 each 2   Evolocumab (REPATHA SURECLICK) 858 MG/ML SOAJ Inject 1 Pen into the skin every 14 (fourteen) days. 2 mL 11   levocetirizine (XYZAL) 5 MG tablet Can use up to 2 tablets in the morning and 2 tablets at night for hives/itching. This is maximum dose. Titrate as needed. 120 tablet 3   levothyroxine (SYNTHROID) 75 MCG tablet Take 1 tablet (75 mcg total) by mouth daily. 90 tablet 0   Multiple Vitamin (MULTIVITAMIN) capsule Take 1 capsule by mouth daily.     ondansetron (ZOFRAN) 4 MG tablet Take 1 tablet (4 mg total) by mouth every 8 (eight) hours as needed for nausea or vomiting. 20 tablet 0   pantoprazole (PROTONIX) 40 MG tablet Take 1 tablet (40 mg total) by mouth daily. 90 tablet 3   No current facility-administered medications on file prior to visit.        ROS:  All others reviewed and negative.  Objective        PE:  BP 136/72 (BP Location: Right Arm, Patient Position: Sitting, Cuff Size: Large)   Pulse 62   Temp 98.5 F (36.9 C) (Oral)   Ht '5\' 4"'$  (1.626 m)   Wt 215 lb (97.5 kg)   SpO2 99%   BMI 36.90 kg/m                 Constitutional: Pt appears in NAD               HENT: Head: NCAT.                Right Ear: External ear normal.                 Left Ear:  External ear normal.  Bilat tm's with mild erythema.  Max sinus areas non tender.  Pharynx with mild erythema, no exudate               Eyes: . Pupils are equal, round, and reactive to light. Conjunctivae and EOM are normal  Nose: without d/c or deformity               Neck: Neck supple. Gross normal ROM               Cardiovascular: Normal rate and regular rhythm.                 Pulmonary/Chest: Effort normal and breath sounds without rales or wheezing.                               Neurological: Pt is alert. At baseline orientation, motor grossly intact               Skin: Skin is warm. No rashes, no other new lesions, LE edema - none               Psychiatric: Pt behavior is normal without agitation   Micro: none  Cardiac tracings I have personally interpreted today:  none  Pertinent Radiological findings (summarize): none   Lab Results  Component Value Date   WBC 4.4 06/20/2022   HGB 12.1 06/20/2022   HCT 36.8 06/20/2022   PLT 204 06/20/2022   GLUCOSE 101 (H) 06/20/2022   CHOL 247 (H) 07/11/2022   TRIG 67 07/11/2022   HDL 69 07/11/2022   LDLCALC 167 (H) 07/11/2022   ALT 10 06/20/2022   AST 15 06/20/2022   NA 139 06/20/2022   K 4.0 06/20/2022   CL 103 06/20/2022   CREATININE 0.79 06/20/2022   BUN 11 06/20/2022   CO2 29 06/20/2022   TSH 0.36 08/24/2022   INR 1.0 01/28/2021   HGBA1C 5.3 02/02/2022   SARS Coronavirus 2 Ag Negative Negative  Negative    Assessment/Plan:  Tina Stone is a 72 y.o. Black or African American [2] female with  has a past medical history of Adenomatous colon polyp, Allergic rhinitis, seasonal, Allergy, Atrial fibrillation ([place]), Beta thalassemia trait (11/25/2013), Cholelithiasis, Class 3 obesity without serious comorbidity with body mass index (BMI) of 40.0 to 44.9 in adult (11/19/2012), endometrial ca (dx'd 08/2009), GERD (gastroesophageal reflux disease), H. pylori infection, History of radiation therapy, HLD (hyperlipidemia),  Hypercholesterolemia, Hypertension (03/18/2017), Hypothyroidism, Intraductal papilloma of left breast, PONV (postoperative nausea and vomiting), Pre-diabetes, Urticaria, and Uterine fibroid.  Allergic rhinoconjunctivitis Mild to mod, for allegra and nasacort asd,  to f/u any worsening symptoms or concerns  Essential hypertension BP Readings from Last 3 Encounters:  09/14/22 136/72  08/24/22 (!) 140/70  08/24/22 (!) 140/70   Stable, pt to continue medical treatment tenormin 25 mg qd   Vitamin D deficiency Last vitamin D Lab Results  Component Value Date   VD25OH 42.24 04/09/2022   Stable, cont oral replacement  Followup: Return if symptoms worsen or fail to improve.  Cathlean Cower, MD 09/17/2022 10:10 PM [facility]

## 2022-09-17 ENCOUNTER — Encounter: Payer: Self-pay | Admitting: Internal Medicine

## 2022-09-17 NOTE — Assessment & Plan Note (Signed)
BP Readings from Last 3 Encounters:  09/14/22 136/72  08/24/22 (!) 140/70  08/24/22 (!) 140/70   Stable, pt to continue medical treatment tenormin 25 mg qd

## 2022-09-17 NOTE — Assessment & Plan Note (Signed)
Last vitamin D Lab Results  Component Value Date   VD25OH 42.24 04/09/2022   Stable, cont oral replacement  

## 2022-09-17 NOTE — Assessment & Plan Note (Signed)
Mild to mod, for allegra and nasacort asd,   to f/u any worsening symptoms or concerns 

## 2022-09-19 ENCOUNTER — Ambulatory Visit: Payer: Medicare Other | Admitting: Sports Medicine

## 2022-09-21 ENCOUNTER — Ambulatory Visit (INDEPENDENT_AMBULATORY_CARE_PROVIDER_SITE_OTHER): Payer: Medicare Other | Admitting: Sports Medicine

## 2022-09-21 VITALS — BP 130/76 | HR 62 | Ht 64.0 in | Wt 213.0 lb

## 2022-09-21 DIAGNOSIS — M7061 Trochanteric bursitis, right hip: Secondary | ICD-10-CM | POA: Diagnosis not present

## 2022-09-21 DIAGNOSIS — M25551 Pain in right hip: Secondary | ICD-10-CM | POA: Diagnosis not present

## 2022-09-21 NOTE — Progress Notes (Signed)
Tina Stone D.[place] Phone: [phone]   Assessment and Plan:     1. Acute right hip pain 2. Greater trochanteric bursitis of right hip  -Acute, uncomplicated, subsequent visit - Resolved hip pain with relative rest.  Suspect patient was experiencing a flare of greater trochanteric bursitis - May continue HEP to prevent recurrence, though no need for further physical therapy at this time - Based on improvement, we will not proceed with NSAID course or with CSI at today's visit  Pertinent previous records reviewed include none   Follow Up: As needed if no improvement or worsening of symptoms   Subjective:   I, Tina Stone, am serving as a Education administrator for Doctor Glennon Mac   Chief Complaint: right hip pain    HPI:    08/24/22 Patient is a 72 year old female complaining of right hip pain. 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   Current Outpatient Medications:    acetaminophen (TYLENOL) 500 MG tablet, Take 1,000 mg by mouth every 6 (six) hours as needed for moderate pain., Disp: , Rfl:    APPLE CIDER VINEGAR PO, Take 5 mLs by mouth daily. , Disp: , Rfl:    atenolol (TENORMIN) 25 MG tablet, TAKE 1 TABLET (25 MG TOTAL) BY MOUTH DAILY., Disp: 90 tablet, Rfl: 1   Cholecalciferol (VITAMIN D3) 50 MCG (2000 UT) TABS, Take by mouth., Disp: , Rfl:    ELIQUIS 5 MG TABS tablet, TAKE 1 TABLET BY MOUTH TWICE A DAY, Disp: 180 tablet, Rfl: 1   EPINEPHrine (EPIPEN 2-PAK) 0.3 mg/0.3 mL IJ SOAJ injection, Inject 0.3  mg into the muscle as needed for anaphylaxis., Disp: 1 each, Rfl: 2   Evolocumab (REPATHA SURECLICK) 829 MG/ML SOAJ, Inject 1 Pen into the skin every 14 (fourteen) days., Disp: 2 mL, Rfl: 11   levocetirizine (XYZAL) 5 MG tablet, Can use up to 2 tablets in the morning and 2 tablets at night for hives/itching. This is maximum dose. Titrate as needed., Disp: 120 tablet, Rfl: 3   levothyroxine (SYNTHROID) 75 MCG tablet, Take 1 tablet (75 mcg total) by mouth daily., Disp: 90 tablet, Rfl: 0   Multiple Vitamin (MULTIVITAMIN) capsule, Take 1 capsule by mouth daily., Disp: , Rfl:    ondansetron (ZOFRAN) 4 MG tablet, Take 1 tablet (4 mg total) by mouth every 8 (eight) hours as needed for nausea or vomiting., Disp: 20 tablet, Rfl: 0   pantoprazole (PROTONIX) 40 MG tablet, Take 1 tablet (40 mg total) by mouth daily., Disp: 90 tablet, Rfl: 3   Objective:     Vitals:   09/21/22 0901  BP: 130/76  Pulse: 62  SpO2: 100%  Weight: 213 lb (96.6 kg)  Height: '5\' 4"'$  (1.626 m)      Body mass index is 36.56 kg/m.    Physical Exam:    General: awake, alert, and oriented no acute distress, nontoxic Skin: no suspicious lesions or rashes Neuro:sensation intact distally with no dificits, normal muscle tone, no atrophy, strength 5/5 in  all tested lower ext groups Psych: normal mood and affect, speech clear  Left hip: No deformity, swelling or wasting ROM Flexion 90, ext 30, IR 45, ER 45 NTTP over the hip flexors, greater troch, glute musculature, si joint, lumbar spine Negative log roll with FROM Negative FABER Negative FADIR Negative Piriformis test Negative trendelenberg Gait normal    Electronically signed by:  Tina Stone D.[facility] 9:17 AM 09/21/22

## 2022-09-21 NOTE — Patient Instructions (Signed)
Good to see you   

## 2022-10-05 ENCOUNTER — Telehealth: Payer: Self-pay | Admitting: Internal Medicine

## 2022-10-05 NOTE — Telephone Encounter (Signed)
Patient wants to know if she is due a pneumonia shot - please advise patient one way or another.

## 2022-10-08 NOTE — Telephone Encounter (Signed)
LVM informing pt she is not due for any vaccines at this time.

## 2022-10-17 ENCOUNTER — Ambulatory Visit: Payer: Medicare Other | Attending: Interventional Cardiology

## 2022-10-17 DIAGNOSIS — E785 Hyperlipidemia, unspecified: Secondary | ICD-10-CM | POA: Diagnosis not present

## 2022-10-18 LAB — APOLIPOPROTEIN B: Apolipoprotein B: 83 mg/dL (ref <90)

## 2022-10-18 LAB — LIPID PANEL
Chol/HDL Ratio: 2.6 ratio (ref 0.0–4.4)
Cholesterol, Total: 219 mg/dL — ABNORMAL HIGH (ref 100–199)
HDL: 84 mg/dL (ref >39)
LDL Chol Calc (NIH): 125 mg/dL — ABNORMAL HIGH (ref 0–99)
Triglycerides: 55 mg/dL (ref 0–149)
VLDL Cholesterol Cal: 10 mg/dL (ref 5–40)

## 2022-10-19 ENCOUNTER — Telehealth: Payer: Self-pay | Admitting: *Deleted

## 2022-10-19 DIAGNOSIS — E785 Hyperlipidemia, unspecified: Secondary | ICD-10-CM

## 2022-10-19 NOTE — Telephone Encounter (Signed)
-----   Message from Belva Crome, MD sent at 10/17/2022  9:17 PM EDT ----- Let the patient know I assume she is still on Repatha. Correct? If so refer to Dr. Debara Pickett A copy will be sent to Stacie Glaze, DO

## 2022-10-23 ENCOUNTER — Telehealth: Payer: Self-pay

## 2022-10-23 ENCOUNTER — Other Ambulatory Visit: Payer: Self-pay | Admitting: Internal Medicine

## 2022-10-23 NOTE — Telephone Encounter (Signed)
Please refill as per office routine med refill policy (all routine meds to be refilled for 3 mo or monthly (per pt preference) up to one year from last visit, then month to month grace period for 3 mo, then further med refills will have to be denied) ? ?

## 2022-10-23 NOTE — Telephone Encounter (Signed)
Called and given below message. She agrees and will call to schedule CT for 12/20 and then call the office back.

## 2022-10-23 NOTE — Telephone Encounter (Signed)
-----   Message from [name], MD sent at 10/22/2022  7:41 AM EDT ----- Pls call and recommend CT to be done/scheduled for 12/20. If she agrees, move her labs flush to be done before CT

## 2022-10-24 ENCOUNTER — Inpatient Hospital Stay: Payer: Medicare Other | Attending: Gynecologic Oncology

## 2022-10-24 DIAGNOSIS — C541 Malignant neoplasm of endometrium: Secondary | ICD-10-CM | POA: Insufficient documentation

## 2022-10-24 MED ORDER — SODIUM CHLORIDE 0.9% FLUSH
10.0000 mL | Freq: Once | INTRAVENOUS | Status: AC
  Administered 2022-10-24: 10 mL

## 2022-10-24 MED ORDER — HEPARIN SOD (PORK) LOCK FLUSH 100 UNIT/ML IV SOLN
500.0000 [IU] | Freq: Once | INTRAVENOUS | Status: AC
  Administered 2022-10-24: 500 [IU]

## 2022-10-28 ENCOUNTER — Encounter: Payer: Self-pay | Admitting: *Deleted

## 2022-10-29 ENCOUNTER — Ambulatory Visit: Payer: Medicare Other | Admitting: Nurse Practitioner

## 2022-11-02 ENCOUNTER — Other Ambulatory Visit: Payer: Self-pay | Admitting: Internal Medicine

## 2022-11-02 DIAGNOSIS — E039 Hypothyroidism, unspecified: Secondary | ICD-10-CM

## 2022-11-04 ENCOUNTER — Encounter: Payer: Self-pay | Admitting: Internal Medicine

## 2022-11-07 ENCOUNTER — Ambulatory Visit (INDEPENDENT_AMBULATORY_CARE_PROVIDER_SITE_OTHER): Payer: Medicare Other | Admitting: Family Medicine

## 2022-11-07 ENCOUNTER — Ambulatory Visit (INDEPENDENT_AMBULATORY_CARE_PROVIDER_SITE_OTHER): Payer: Medicare Other

## 2022-11-07 ENCOUNTER — Encounter: Payer: Self-pay | Admitting: Family Medicine

## 2022-11-07 VITALS — BP 134/86 | HR 80 | Temp 97.3°F | Ht 64.0 in | Wt 214.0 lb

## 2022-11-07 DIAGNOSIS — R350 Frequency of micturition: Secondary | ICD-10-CM

## 2022-11-07 DIAGNOSIS — R319 Hematuria, unspecified: Secondary | ICD-10-CM

## 2022-11-07 DIAGNOSIS — M545 Low back pain, unspecified: Secondary | ICD-10-CM

## 2022-11-07 LAB — POCT URINALYSIS DIPSTICK
Bilirubin, UA: NEGATIVE
Blood, UA: POSITIVE
Glucose, UA: NEGATIVE
Ketones, UA: NEGATIVE
Nitrite, UA: NEGATIVE
Protein, UA: NEGATIVE
Spec Grav, UA: 1.025 (ref 1.010–1.025)
Urobilinogen, UA: 0.2 E.U./dL
pH, UA: 6 (ref 5.0–8.0)

## 2022-11-07 NOTE — Patient Instructions (Addendum)
You appear to have a muscle strain or tightness.   Please go downstairs for an X ray of your low back.   I recommend using a heating pad and doing gentle stretches of your low back and hamstrings.   You can use a topical pain medication such as Salon Pas with lidocaine, Biofreeze, etc.   Take Tylenol 500 mg or 1,000 mg as needed.   Stay well hydrated. We will be in touch with your urine culture results.  If you have any new or worsening urinary symptoms, let us know.   Follow up if in 2 weeks or sooner if needed.    Back Exercises These exercises help to make your trunk and back strong. They also help to keep the lower back flexible. Doing these exercises can help to prevent or lessen pain in your lower back. If you have back pain, try to do these exercises 2-3 times each day or as told by your doctor. As you get better, do the exercises once each day. Repeat the exercises more often as told by your doctor. To stop back pain from coming back, do the exercises once each day, or as told by your doctor. Do exercises exactly as told by your doctor. Stop right away if you feel sudden pain or your pain gets worse. Exercises Single knee to chest Do these steps 3-5 times in a row for each leg: Lie on your back on a firm bed or the floor with your legs stretched out. Bring one knee to your chest. Grab your knee or thigh with both hands and hold it in place. Pull on your knee until you feel a gentle stretch in your lower back or butt. Keep doing the stretch for 10-30 seconds. Slowly let go of your leg and straighten it. Pelvic tilt Do these steps 5-10 times in a row: Lie on your back on a firm bed or the floor with your legs stretched out. Bend your knees so they point up to the ceiling. Your feet should be flat on the floor. Tighten your lower belly (abdomen) muscles to press your lower back against the floor. This will make your tailbone point up to the ceiling instead of pointing down to  your feet or the floor. Stay in this position for 5-10 seconds while you gently tighten your muscles and breathe evenly. Cat-cow Do these steps until your lower back bends more easily: Get on your hands and knees on a firm bed or the floor. Keep your hands under your shoulders, and keep your knees under your hips. You may put padding under your knees. Let your head hang down toward your chest. Tighten (contract) the muscles in your belly. Point your tailbone toward the floor so your lower back becomes rounded like the back of a cat. Stay in this position for 5 seconds. Slowly lift your head. Let the muscles of your belly relax. Point your tailbone up toward the ceiling so your back forms a sagging arch like the back of a cow. Stay in this position for 5 seconds.  Press-ups Do these steps 5-10 times in a row: Lie on your belly (face-down) on a firm bed or the floor. Place your hands near your head, about shoulder-width apart. While you keep your back relaxed and keep your hips on the floor, slowly straighten your arms to raise the top half of your body and lift your shoulders. Do not use your back muscles. You may change where you place your hands  to make yourself more comfortable. Stay in this position for 5 seconds. Keep your back relaxed. Slowly return to lying flat on the floor.  Bridges Do these steps 10 times in a row: Lie on your back on a firm bed or the floor. Bend your knees so they point up to the ceiling. Your feet should be flat on the floor. Your arms should be flat at your sides, next to your body. Tighten your butt muscles and lift your butt off the floor until your waist is almost as high as your knees. If you do not feel the muscles working in your butt and the back of your thighs, slide your feet 1-2 inches (2.5-5 cm) farther away from your butt. Stay in this position for 3-5 seconds. Slowly lower your butt to the floor, and let your butt muscles relax. If this exercise is  too easy, try doing it with your arms crossed over your chest. Belly crunches Do these steps 5-10 times in a row: Lie on your back on a firm bed or the floor with your legs stretched out. Bend your knees so they point up to the ceiling. Your feet should be flat on the floor. Cross your arms over your chest. Tip your chin a little bit toward your chest, but do not bend your neck. Tighten your belly muscles and slowly raise your chest just enough to lift your shoulder blades a tiny bit off the floor. Avoid raising your body higher than that because it can put too much stress on your lower back. Slowly lower your chest and your head to the floor. Back lifts Do these steps 5-10 times in a row: Lie on your belly (face-down) with your arms at your sides, and rest your forehead on the floor. Tighten the muscles in your legs and your butt. Slowly lift your chest off the floor while you keep your hips on the floor. Keep the back of your head in line with the curve in your back. Look at the floor while you do this. Stay in this position for 3-5 seconds. Slowly lower your chest and your face to the floor. Contact a doctor if: Your back pain gets a lot worse when you do an exercise. Your back pain does not get better within 2 hours after you exercise. If you have any of these problems, stop doing the exercises. Do not do them again unless your doctor says it is okay. Get help right away if: You have sudden, very bad back pain. If this happens, stop doing the exercises. Do not do them again unless your doctor says it is okay. This information is not intended to replace advice given to you by your health care provider. Make sure you discuss any questions you have with your health care provider. Document Revised: 03/01/2021 Document Reviewed: 03/01/2021 Elsevier Patient Education  [place].

## 2022-11-07 NOTE — Progress Notes (Unsigned)
Subjective:     Patient ID: Tina Stone, female    DOB: [birth year], 72 y.o.   MRN: [id number]  Chief Complaint  Patient presents with   Back Pain    Lower back pain for 2-3 weeks on and off. States it is more of an "annoying pain", maybe a 2 out of 10 in pain scale.     Back Pain   Patient is in today for a 2 wk hx of intermittent low back pain. No known injury. She has been doing pool aerobics recently.  Pain is non radiating.  Pain is 1-2/10.   Urinary frequency without urgency or dysuria.   Denies fever, chills, dizziness, chest pain, palpitations, shortness of breath, abdominal pain, N/V/D.    There are no preventive care reminders to display for this patient.  Past Medical History:  Diagnosis Date   Adenomatous colon polyp    Allergic rhinitis, seasonal    Allergy    Atrial fibrillation ([place])    Beta thalassemia trait 11/25/2013   Cholelithiasis    Class 3 obesity without serious comorbidity with body mass index (BMI) of 40.0 to 44.9 in adult 11/19/2012   endometrial ca dx'd 08/2009   endometrial    GERD (gastroesophageal reflux disease)    H. pylori infection    History of radiation therapy    endometrial - 02/19/2022-03/23/2022 Dr Gery Pray   HLD (hyperlipidemia)    Hypercholesterolemia    Hypertension 03/18/2017   no meds    Hypothyroidism    Intraductal papilloma of left breast    Patient underwent left needle-localized lumpectomy by Dr. Imogene Burn. Tsuei on 09/09/2013; pathology showed intraductal papilloma with no atypia or malignancy identified.   PONV (postoperative nausea and vomiting)    Pre-diabetes    pt denies   Urticaria    Uterine fibroid     Past Surgical History:  Procedure Laterality Date   ANKLE FRACTURE SURGERY Right 1966   right   BREAST EXCISIONAL BIOPSY Left 2014   benign   BREAST LUMPECTOMY WITH NEEDLE LOCALIZATION Left 09/09/2013   Procedure: BREAST LUMPECTOMY WITH NEEDLE LOCALIZATION;  Surgeon: Imogene Burn. Georgette Dover, MD;   Location: [facility];  Service: General;  Laterality: Left;   CHOLECYSTECTOMY     COLONOSCOPY     IR IMAGING GUIDED PORT INSERTION  05/18/2020   POLYPECTOMY     ROBOTIC ASSISTED LAPAROSCOPIC CHOLECYSTECTOMY  09/09/2019   ROBOTIC ASSISTED TOTAL HYSTERECTOMY WITH BILATERAL SALPINGO OOPHERECTOMY N/A 10/22/2019   Procedure: XI ROBOTIC ASSISTED TOTAL HYSTERECTOMY WITH BILATERAL SALPINGO OOPHORECTOMY GREATER THAN 250 GRAMS, MINI LAPARTOMY FOR SPECIMEN DELIVERY; PELVIC AND PERI-AORTIC LYMPHADENECTOMY;  Surgeon: Everitt Amber, MD;  Location: [facility];  Service: Gynecology;  Laterality: N/A;   SENTINEL NODE BIOPSY N/A 10/22/2019   Procedure: SENTINEL NODE BIOPSY;  Surgeon: Everitt Amber, MD;  Location: [facility];  Service: Gynecology;  Laterality: N/A;    Family History  Problem Relation Age of Onset   Diabetes Mother    Hypertension Mother    Colon polyps Mother 26   Dementia Mother 23   Diabetes Father    Congestive Heart Failure Father    Asthma Brother    Diabetes Brother    Hypertension Brother    Hypertension Brother    Hypertension Maternal Grandmother    Diabetes Maternal Grandmother    Asthma Paternal Grandmother    Lung disease Paternal Grandmother    Diabetes Paternal Grandfather    Pancreatic cancer Paternal Aunt    Uterine cancer Paternal  Aunt    Colon cancer Neg Hx    Breast cancer Neg Hx    Lung cancer Neg Hx    Esophageal cancer Neg Hx    Rectal cancer Neg Hx    Stomach cancer Neg Hx     Social History   Socioeconomic History   Marital status: Widowed    Spouse name: Not on file   Number of children: 0   Years of education: Not on file   Highest education level: Not on file  Occupational History   Occupation: Lawyer  Tobacco Use   Smoking status: Never   Smokeless tobacco: Never   Tobacco comments:    few puffs but not a true smoker quit many yrs ago  Vaping Use   Vaping Use: Never used  Substance and Sexual Activity   Alcohol use: Never   Drug use: Never    Sexual activity: Not Currently  Other Topics Concern   Not on file  Social History Narrative   ** Merged History Encounter **       Lives in [place], widowed 2003   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         Social Determinants of Health   Financial Resource Strain: [place]  (05/15/2022)   Overall Financial Resource Strain (CARDIA)    Difficulty of Paying Living Expenses: Not hard at all  Food Insecurity: No [place] (05/15/2022)   Hunger Vital Sign    Worried About Running Out of Food in the Last Year: Never true    [place] in the Last Year: Never true  Transportation Needs: No Transportation Needs (05/15/2022)   PRAPARE - Hydrologist (Medical): No    Lack of Transportation (Non-Medical): No  Physical Activity: Sufficiently Active (05/15/2022)   Exercise Vital Sign    Days of Exercise per Week: 5 days    Minutes of Exercise per Session: 60 min  Stress: No Stress Concern Present (05/15/2022)   [place]    Feeling of Stress : Not at all  Social Connections: Moderately Integrated (05/15/2022)   Social Connection and Isolation Panel [NHANES]    Frequency of Communication with Friends and Family: More than three times a week    Frequency of Social Gatherings with Friends and Family: More than three times a week    Attends Religious Services: More than 4 times per year    Active Member of [employer] or Organizations: Yes    Attends Archivist Meetings: More than 4 times per year    Marital Status: Widowed  Intimate Partner Violence: Not At Risk (05/15/2022)   Humiliation, Afraid, Rape, and Kick questionnaire    Fear of Current or Ex-Partner: No    Emotionally Abused: No    Physically Abused: No    Sexually Abused: No    Outpatient Medications Prior to Visit  Medication Sig Dispense Refill   acetaminophen (TYLENOL) 500 MG tablet Take 1,000 mg by mouth  every 6 (six) hours as needed for moderate pain.     APPLE CIDER VINEGAR PO Take 5 mLs by mouth daily.      atenolol (TENORMIN) 25 MG tablet TAKE 1 TABLET (25 MG TOTAL) BY MOUTH DAILY. 90 tablet 1   Cholecalciferol (VITAMIN D3) 50 MCG (2000 UT) TABS Take by mouth.     ELIQUIS 5 MG TABS tablet TAKE 1 TABLET BY MOUTH TWICE A DAY 180 tablet  1   EPINEPHrine (EPIPEN 2-PAK) 0.3 mg/0.3 mL IJ SOAJ injection Inject 0.3 mg into the muscle as needed for anaphylaxis. 1 each 2   Evolocumab (REPATHA SURECLICK) 263 MG/ML SOAJ Inject 1 Pen into the skin every 14 (fourteen) days. 2 mL 11   levocetirizine (XYZAL) 5 MG tablet Can use up to 2 tablets in the morning and 2 tablets at night for hives/itching. This is maximum dose. Titrate as needed. 120 tablet 3   levothyroxine (SYNTHROID) 100 MCG tablet TAKE 1 TABLET BY MOUTH EVERY DAY 90 tablet 3   levothyroxine (SYNTHROID) 75 MCG tablet TAKE 1 TABLET BY MOUTH EVERY DAY 90 tablet 0   Multiple Vitamin (MULTIVITAMIN) capsule Take 1 capsule by mouth daily.     ondansetron (ZOFRAN) 4 MG tablet Take 1 tablet (4 mg total) by mouth every 8 (eight) hours as needed for nausea or vomiting. 20 tablet 0   pantoprazole (PROTONIX) 40 MG tablet Take 1 tablet (40 mg total) by mouth daily. 90 tablet 3   No facility-administered medications prior to visit.    Allergies  Allergen Reactions   Benicar Hct [Olmesartan Medoxomil-Hctz] Shortness Of Breath and Palpitations   Iodinated Contrast Media Hives and Itching    02/02/2021-  pt developed 2 hives and itching even with the 13hr premedication.  Radiology PA came into eval the pt.  Developed itching and hives after injection on 05/10/20; needs 13hr prep in future   Crestor [Rosuvastatin] Other (See Comments)    Muscle aches   Bactrim [Sulfamethoxazole-Trimethoprim] Other (See Comments)    Abdominal pain, dizziness   Pravachol [Pravastatin] Other (See Comments)    Lower abdominal pain   Shellfish Allergy Nausea And Vomiting    Amlodipine Palpitations    Review of Systems  Musculoskeletal:  Positive for back pain.       Objective:    Physical Exam Constitutional:      General: She is not in acute distress.    Appearance: She is not ill-appearing.  HENT:     Mouth/Throat:     Mouth: Mucous membranes are moist.  Eyes:     Conjunctiva/sclera: Conjunctivae normal.  Cardiovascular:     Rate and Rhythm: Normal rate.  Pulmonary:     Effort: Pulmonary effort is normal.  Musculoskeletal:     Cervical back: Normal, normal range of motion and neck supple.     Thoracic back: Normal.     Lumbar back: No spasms, tenderness or bony tenderness. Decreased range of motion. Negative right straight leg raise test and negative left straight leg raise test.     Right lower leg: No edema.     Left lower leg: No edema.     Comments: Good sensation and motion of her back but pain with flexion and extension.   Skin:    General: Skin is warm and dry.  Neurological:     General: No focal deficit present.     Mental Status: She is alert and oriented to person, place, and time.     Sensory: No sensory deficit.     Motor: No weakness.     Coordination: Coordination normal.     Gait: Gait normal.  Psychiatric:        Mood and Affect: Mood normal.        Behavior: Behavior normal.        Thought Content: Thought content normal.     BP 134/86 (BP Location: Left Arm, Patient Position: Sitting, Cuff Size: Large)   Pulse  80   Temp (!) 97.3 F (36.3 C) (Temporal)   Ht '5\' 4"'$  (1.626 m)   Wt 214 lb (97.1 kg)   SpO2 99%   BMI 36.73 kg/m  Wt Readings from Last 3 Encounters:  11/07/22 214 lb (97.1 kg)  09/21/22 213 lb (96.6 kg)  09/14/22 215 lb (97.5 kg)       Assessment & Plan:   Problem List Items Addressed This Visit   None Visit Diagnoses     Acute bilateral low back pain without sciatica    -  Primary   Relevant Orders   POCT urinalysis dipstick (Completed)   Urine Culture   DG Lumbar Spine Complete    Urinary frequency       Relevant Orders   POCT urinalysis dipstick (Completed)   Urine Culture   Hematuria, unspecified type       Relevant Orders   Urine Culture   DG Lumbar Spine Complete       I am having Aparna E. Strebeck maintain her APPLE CIDER VINEGAR PO, multivitamin, acetaminophen, Vitamin D3, EPINEPHrine, levocetirizine, ondansetron, pantoprazole, atenolol, Eliquis, Repatha SureClick, levothyroxine, and levothyroxine.  No orders of the defined types were placed in this encounter.

## 2022-11-08 LAB — URINE CULTURE

## 2022-11-13 ENCOUNTER — Ambulatory Visit (INDEPENDENT_AMBULATORY_CARE_PROVIDER_SITE_OTHER): Payer: Medicare Other | Admitting: Internal Medicine

## 2022-11-13 ENCOUNTER — Encounter: Payer: Self-pay | Admitting: Internal Medicine

## 2022-11-13 VITALS — BP 128/76 | HR 69 | Temp 97.6°F | Ht 64.0 in | Wt 213.0 lb

## 2022-11-13 DIAGNOSIS — K529 Noninfective gastroenteritis and colitis, unspecified: Secondary | ICD-10-CM

## 2022-11-13 DIAGNOSIS — I10 Essential (primary) hypertension: Secondary | ICD-10-CM | POA: Diagnosis not present

## 2022-11-13 DIAGNOSIS — E559 Vitamin D deficiency, unspecified: Secondary | ICD-10-CM

## 2022-11-13 LAB — CBC WITH DIFFERENTIAL/PLATELET
Basophils Absolute: 0 10*3/uL (ref 0.0–0.1)
Basophils Relative: 0.7 % (ref 0.0–3.0)
Eosinophils Absolute: 0.1 10*3/uL (ref 0.0–0.7)
Eosinophils Relative: 1.4 % (ref 0.0–5.0)
HCT: 37.6 % (ref 36.0–46.0)
Hemoglobin: 12 g/dL (ref 12.0–15.0)
Lymphocytes Relative: 28.9 % (ref 12.0–46.0)
Lymphs Abs: 1.3 10*3/uL (ref 0.7–4.0)
MCHC: 31.9 g/dL (ref 30.0–36.0)
MCV: 78.4 fl (ref 78.0–100.0)
Monocytes Absolute: 0.4 10*3/uL (ref 0.1–1.0)
Monocytes Relative: 8.9 % (ref 3.0–12.0)
Neutro Abs: 2.7 10*3/uL (ref 1.4–7.7)
Neutrophils Relative %: 60.1 % (ref 43.0–77.0)
Platelets: 204 10*3/uL (ref 150.0–400.0)
RBC: 4.8 Mil/uL (ref 3.87–5.11)
RDW: 17.1 % — ABNORMAL HIGH (ref 11.5–15.5)
WBC: 4.6 10*3/uL (ref 4.0–10.5)

## 2022-11-13 LAB — BASIC METABOLIC PANEL
BUN: 12 mg/dL (ref 6–23)
CO2: 33 mEq/L — ABNORMAL HIGH (ref 19–32)
Calcium: 9.2 mg/dL (ref 8.4–10.5)
Chloride: 103 mEq/L (ref 96–112)
Creatinine, Ser: 0.86 mg/dL (ref 0.40–1.20)
GFR: 68.7 mL/min (ref >60.00)
Glucose, Bld: 92 mg/dL (ref 70–99)
Potassium: 4.1 mEq/L (ref 3.5–5.1)
Sodium: 139 mEq/L (ref 135–145)

## 2022-11-13 LAB — HEPATIC FUNCTION PANEL
ALT: 12 U/L (ref 0–35)
AST: 17 U/L (ref 0–37)
Albumin: 3.9 g/dL (ref 3.5–5.2)
Alkaline Phosphatase: 110 U/L (ref 39–117)
Bilirubin, Direct: 0.1 mg/dL (ref 0.0–0.3)
Total Bilirubin: 0.3 mg/dL (ref 0.2–1.2)
Total Protein: 7 g/dL (ref 6.0–8.3)

## 2022-11-13 NOTE — Assessment & Plan Note (Signed)
D/w pt - likely viral, but will check cbc with labs as ordered, with further eval and tx pending results

## 2022-11-13 NOTE — Assessment & Plan Note (Signed)
Last vitamin D Lab Results  Component Value Date   VD25OH 42.24 04/09/2022   Stable, cont oral replacement

## 2022-11-13 NOTE — Progress Notes (Signed)
Patient ID: Tina Stone, female   DOB: 1950/09/01, 72 y.o.   MRN: [id number]        Chief Complaint: follow up GI symptoms and loose stool       HPI:  Tina Stone is a 72 y.o. female here with c/o 2 wks onset mild abd discomfort, nausea and loose stools and feeling feverish, all after exposed to nephew with same.  Symtpoms now mcuh improved but still with loose stools, wondering if might be a problem as the nephew illness has resolved.  Pt denies chest pain, increased sob or doe, wheezing, orthopnea, PND, increased LE swelling, palpitations, dizziness or syncope.   Pt denies polydipsia, polyuria, or new focal neuro s/s. Still taking vit d       Wt Readings from Last 3 Encounters:  11/13/22 213 lb (96.6 kg)  11/07/22 214 lb (97.1 kg)  09/21/22 213 lb (96.6 kg)   BP Readings from Last 3 Encounters:  11/13/22 128/76  11/07/22 134/86  09/21/22 130/76         Past Medical History:  Diagnosis Date   Adenomatous colon polyp    Allergic rhinitis, seasonal    Allergy    Atrial fibrillation ([place])    Beta thalassemia trait 11/25/2013   Cholelithiasis    Class 3 obesity without serious comorbidity with body mass index (BMI) of 40.0 to 44.9 in adult 11/19/2012   endometrial ca dx'd 08/2009   endometrial    GERD (gastroesophageal reflux disease)    H. pylori infection    History of radiation therapy    endometrial - 02/19/2022-03/23/2022 Dr Gery Pray   HLD (hyperlipidemia)    Hypercholesterolemia    Hypertension 03/18/2017   no meds    Hypothyroidism    Intraductal papilloma of left breast    Patient underwent left needle-localized lumpectomy by Dr. Imogene Burn. Tsuei on 09/09/2013; pathology showed intraductal papilloma with no atypia or malignancy identified.   PONV (postoperative nausea and vomiting)    Pre-diabetes    pt denies   Urticaria    Uterine fibroid    Past Surgical History:  Procedure Laterality Date   ANKLE FRACTURE SURGERY Right 1966   right   BREAST  EXCISIONAL BIOPSY Left 2014   benign   BREAST LUMPECTOMY WITH NEEDLE LOCALIZATION Left 09/09/2013   Procedure: BREAST LUMPECTOMY WITH NEEDLE LOCALIZATION;  Surgeon: Imogene Burn. Georgette Dover, MD;  Location: [facility];  Service: General;  Laterality: Left;   CHOLECYSTECTOMY     COLONOSCOPY     IR IMAGING GUIDED PORT INSERTION  05/18/2020   POLYPECTOMY     ROBOTIC ASSISTED LAPAROSCOPIC CHOLECYSTECTOMY  09/09/2019   ROBOTIC ASSISTED TOTAL HYSTERECTOMY WITH BILATERAL SALPINGO OOPHERECTOMY N/A 10/22/2019   Procedure: XI ROBOTIC ASSISTED TOTAL HYSTERECTOMY WITH BILATERAL SALPINGO OOPHORECTOMY GREATER THAN 250 GRAMS, MINI LAPARTOMY FOR SPECIMEN DELIVERY; PELVIC AND PERI-AORTIC LYMPHADENECTOMY;  Surgeon: Everitt Amber, MD;  Location: [facility];  Service: Gynecology;  Laterality: N/A;   SENTINEL NODE BIOPSY N/A 10/22/2019   Procedure: SENTINEL NODE BIOPSY;  Surgeon: Everitt Amber, MD;  Location: [facility];  Service: Gynecology;  Laterality: N/A;    reports that she has never smoked. She has never used smokeless tobacco. She reports that she does not drink alcohol and does not use drugs. family history includes Asthma in her brother and paternal grandmother; Colon polyps (age of onset: 37) in her mother; Congestive Heart Failure in her father; Dementia (age of onset: 60) in her mother; Diabetes in her brother, father, maternal grandmother,  mother, and paternal grandfather; Hypertension in her brother, brother, maternal grandmother, and mother; Lung disease in her paternal grandmother; Pancreatic cancer in her paternal aunt; Uterine cancer in her paternal aunt. Allergies  Allergen Reactions   Benicar Hct [Olmesartan Medoxomil-Hctz] Shortness Of Breath and Palpitations   Iodinated Contrast Media Hives and Itching    02/02/2021-  pt developed 2 hives and itching even with the 13hr premedication.  Radiology PA came into eval the pt.  Developed itching and hives after injection on 05/10/20; needs 13hr prep in future   Crestor  [Rosuvastatin] Other (See Comments)    Muscle aches   Bactrim [Sulfamethoxazole-Trimethoprim] Other (See Comments)    Abdominal pain, dizziness   Pravachol [Pravastatin] Other (See Comments)    Lower abdominal pain   Shellfish Allergy Nausea And Vomiting   Amlodipine Palpitations   Current Outpatient Medications on File Prior to Visit  Medication Sig Dispense Refill   acetaminophen (TYLENOL) 500 MG tablet Take 1,000 mg by mouth every 6 (six) hours as needed for moderate pain.     APPLE CIDER VINEGAR PO Take 5 mLs by mouth daily.      atenolol (TENORMIN) 25 MG tablet TAKE 1 TABLET (25 MG TOTAL) BY MOUTH DAILY. 90 tablet 1   Cholecalciferol (VITAMIN D3) 50 MCG (2000 UT) TABS Take by mouth.     ELIQUIS 5 MG TABS tablet TAKE 1 TABLET BY MOUTH TWICE A DAY 180 tablet 1   EPINEPHrine (EPIPEN 2-PAK) 0.3 mg/0.3 mL IJ SOAJ injection Inject 0.3 mg into the muscle as needed for anaphylaxis. 1 each 2   Evolocumab (REPATHA SURECLICK) 440 MG/ML SOAJ Inject 1 Pen into the skin every 14 (fourteen) days. 2 mL 11   levocetirizine (XYZAL) 5 MG tablet Can use up to 2 tablets in the morning and 2 tablets at night for hives/itching. This is maximum dose. Titrate as needed. 120 tablet 3   levothyroxine (SYNTHROID) 100 MCG tablet TAKE 1 TABLET BY MOUTH EVERY DAY 90 tablet 3   levothyroxine (SYNTHROID) 75 MCG tablet TAKE 1 TABLET BY MOUTH EVERY DAY 90 tablet 0   Multiple Vitamin (MULTIVITAMIN) capsule Take 1 capsule by mouth daily.     ondansetron (ZOFRAN) 4 MG tablet Take 1 tablet (4 mg total) by mouth every 8 (eight) hours as needed for nausea or vomiting. 20 tablet 0   pantoprazole (PROTONIX) 40 MG tablet Take 1 tablet (40 mg total) by mouth daily. 90 tablet 3   No current facility-administered medications on file prior to visit.        ROS:  All others reviewed and negative.  Objective        PE:  BP 128/76 (BP Location: Right Arm, Patient Position: Sitting, Cuff Size: Large)   Pulse 69   Temp 97.6 F  (36.4 C) (Oral)   Ht '5\' 4"'$  (1.626 m)   Wt 213 lb (96.6 kg)   SpO2 97%   BMI 36.56 kg/m                 Constitutional: Pt appears in NAD               HENT: Head: NCAT.                Right Ear: External ear normal.                 Left Ear: External ear normal.                Eyes: .  Pupils are equal, round, and reactive to light. Conjunctivae and EOM are normal               Nose: without d/c or deformity               Neck: Neck supple. Gross normal ROM               Cardiovascular: Normal rate and regular rhythm.                 Pulmonary/Chest: Effort normal and breath sounds without rales or wheezing.                Abd:  Soft, NT, ND, + BS, no organomegaly               Neurological: Pt is alert. At baseline orientation, motor grossly intact               Skin: Skin is warm. No rashes, no other new lesions, LE edema - none               Psychiatric: Pt behavior is normal without agitation   Micro: none  Cardiac tracings I have personally interpreted today:  none  Pertinent Radiological findings (summarize): none   Lab Results  Component Value Date   WBC 4.4 06/20/2022   HGB 12.1 06/20/2022   HCT 36.8 06/20/2022   PLT 204 06/20/2022   GLUCOSE 101 (H) 06/20/2022   CHOL 219 (H) 10/17/2022   TRIG 55 10/17/2022   HDL 84 10/17/2022   LDLCALC 125 (H) 10/17/2022   ALT 10 06/20/2022   AST 15 06/20/2022   NA 139 06/20/2022   K 4.0 06/20/2022   CL 103 06/20/2022   CREATININE 0.79 06/20/2022   BUN 11 06/20/2022   CO2 29 06/20/2022   TSH 0.36 08/24/2022   INR 1.0 01/28/2021   HGBA1C 5.3 02/02/2022   Assessment/Plan:  Kanyah Matsushima is a 72 y.o. Black or African American [2] female with  has a past medical history of Adenomatous colon polyp, Allergic rhinitis, seasonal, Allergy, Atrial fibrillation ([place]), Beta thalassemia trait (11/25/2013), Cholelithiasis, Class 3 obesity without serious comorbidity with body mass index (BMI) of 40.0 to 44.9 in adult (11/19/2012),  endometrial ca (dx'd 08/2009), GERD (gastroesophageal reflux disease), H. pylori infection, History of radiation therapy, HLD (hyperlipidemia), Hypercholesterolemia, Hypertension (03/18/2017), Hypothyroidism, Intraductal papilloma of left breast, PONV (postoperative nausea and vomiting), Pre-diabetes, Urticaria, and Uterine fibroid.  Gastroenteritis D/w pt - likely viral, but will check cbc with labs as ordered, with further eval and tx pending results  Essential hypertension BP Readings from Last 3 Encounters:  11/13/22 128/76  11/07/22 134/86  09/21/22 130/76   Stable, pt to continue medical treatment tenormin 25 mg qd   Vitamin D deficiency Last vitamin D Lab Results  Component Value Date   VD25OH 42.24 04/09/2022   Stable, cont oral replacement  Followup: Return if symptoms worsen or fail to improve.  Cathlean Cower, MD 11/13/2022 4:22 PM [facility]

## 2022-11-13 NOTE — Assessment & Plan Note (Signed)
BP Readings from Last 3 Encounters:  11/13/22 128/76  11/07/22 134/86  09/21/22 130/76   Stable, pt to continue medical treatment tenormin 25 mg qd

## 2022-11-13 NOTE — Patient Instructions (Signed)
Ok to take Immodium as needed  Please continue all other medications as before, and refills have been done if requested.  Please have the pharmacy call with any other refills you may need.  Please keep your appointments with your specialists as you may have planned  Please go to the LAB at the blood drawing area for the tests to be done  You will be contacted by phone if any changes need to be made immediately.  Otherwise, you will receive a letter about your results with an explanation, but please check with MyChart first.

## 2022-11-19 ENCOUNTER — Ambulatory Visit (INDEPENDENT_AMBULATORY_CARE_PROVIDER_SITE_OTHER): Payer: Medicare Other | Admitting: Family Medicine

## 2022-11-19 ENCOUNTER — Encounter: Payer: Self-pay | Admitting: Family Medicine

## 2022-11-19 VITALS — BP 128/84 | HR 60 | Temp 97.6°F | Ht 64.0 in | Wt 214.0 lb

## 2022-11-19 DIAGNOSIS — N029 Recurrent and persistent hematuria with unspecified morphologic changes: Secondary | ICD-10-CM

## 2022-11-19 DIAGNOSIS — M545 Low back pain, unspecified: Secondary | ICD-10-CM

## 2022-11-19 LAB — POCT URINALYSIS DIPSTICK
Bilirubin, UA: NEGATIVE
Blood, UA: POSITIVE
Glucose, UA: NEGATIVE
Ketones, UA: NEGATIVE
Leukocytes, UA: NEGATIVE
Nitrite, UA: NEGATIVE
Protein, UA: NEGATIVE
Spec Grav, UA: 1.015 (ref 1.010–1.025)
Urobilinogen, UA: 0.2 E.U./dL
pH, UA: 6 (ref 5.0–8.0)

## 2022-11-19 LAB — URINALYSIS, MICROSCOPIC ONLY

## 2022-11-19 NOTE — Patient Instructions (Signed)
We will be in touch with your urine results and recommendations.

## 2022-11-19 NOTE — Addendum Note (Signed)
Addended by: Girtha Rm on: 11/19/2022 01:22 PM   Modules accepted: Orders

## 2022-11-19 NOTE — Progress Notes (Addendum)
Subjective:     Patient ID: Tina Stone, female    DOB: Aug 15, 1950, 72 y.o.   MRN: [id number]  Chief Complaint  Patient presents with   Follow-up    2 week f/u for back pain, states she has been doing a lot better. Reports every now and then it will bother her but realized she just needs a good stretch or massage.     HPI Patient is in today for follow up on low back pain and hematuria.   States her back pain has improved. She has been swimming a lot. States she feels like she needs to stretch. Would like to be referred for massage or PT.   Denies any urinary symptoms.  No smoking hx.   There are no preventive care reminders to display for this patient.  Past Medical History:  Diagnosis Date   Adenomatous colon polyp    Allergic rhinitis, seasonal    Allergy    Atrial fibrillation ([place])    Beta thalassemia trait 11/25/2013   Cholelithiasis    Class 3 obesity without serious comorbidity with body mass index (BMI) of 40.0 to 44.9 in adult 11/19/2012   endometrial ca dx'd 08/2009   endometrial    GERD (gastroesophageal reflux disease)    H. pylori infection    History of radiation therapy    endometrial - 02/19/2022-03/23/2022 Dr Gery Pray   HLD (hyperlipidemia)    Hypercholesterolemia    Hypertension 03/18/2017   no meds    Hypothyroidism    Intraductal papilloma of left breast    Patient underwent left needle-localized lumpectomy by Dr. Imogene Burn. Tsuei on 09/09/2013; pathology showed intraductal papilloma with no atypia or malignancy identified.   PONV (postoperative nausea and vomiting)    Pre-diabetes    pt denies   Urticaria    Uterine fibroid     Past Surgical History:  Procedure Laterality Date   ANKLE FRACTURE SURGERY Right 1966   right   BREAST EXCISIONAL BIOPSY Left 2014   benign   BREAST LUMPECTOMY WITH NEEDLE LOCALIZATION Left 09/09/2013   Procedure: BREAST LUMPECTOMY WITH NEEDLE LOCALIZATION;  Surgeon: Imogene Burn. Georgette Dover, MD;  Location: [place];   Service: General;  Laterality: Left;   CHOLECYSTECTOMY     COLONOSCOPY     IR IMAGING GUIDED PORT INSERTION  05/18/2020   POLYPECTOMY     ROBOTIC ASSISTED LAPAROSCOPIC CHOLECYSTECTOMY  09/09/2019   ROBOTIC ASSISTED TOTAL HYSTERECTOMY WITH BILATERAL SALPINGO OOPHERECTOMY N/A 10/22/2019   Procedure: XI ROBOTIC ASSISTED TOTAL HYSTERECTOMY WITH BILATERAL SALPINGO OOPHORECTOMY GREATER THAN 250 GRAMS, MINI LAPARTOMY FOR SPECIMEN DELIVERY; PELVIC AND PERI-AORTIC LYMPHADENECTOMY;  Surgeon: Everitt Amber, MD;  Location: [facility];  Service: Gynecology;  Laterality: N/A;   SENTINEL NODE BIOPSY N/A 10/22/2019   Procedure: SENTINEL NODE BIOPSY;  Surgeon: Everitt Amber, MD;  Location: [facility];  Service: Gynecology;  Laterality: N/A;    Family History  Problem Relation Age of Onset   Diabetes Mother    Hypertension Mother    Colon polyps Mother 75   Dementia Mother 80   Diabetes Father    Congestive Heart Failure Father    Asthma Brother    Diabetes Brother    Hypertension Brother    Hypertension Brother    Hypertension Maternal Grandmother    Diabetes Maternal Grandmother    Asthma Paternal Grandmother    Lung disease Paternal Grandmother    Diabetes Paternal Grandfather    Pancreatic cancer Paternal Aunt    Uterine cancer Paternal  Aunt    Colon cancer Neg Hx    Breast cancer Neg Hx    Lung cancer Neg Hx    Esophageal cancer Neg Hx    Rectal cancer Neg Hx    Stomach cancer Neg Hx     Social History   Socioeconomic History   Marital status: Widowed    Spouse name: Not on file   Number of children: 0   Years of education: Not on file   Highest education level: Not on file  Occupational History   Occupation: Lawyer  Tobacco Use   Smoking status: Never   Smokeless tobacco: Never   Tobacco comments:    few puffs but not a true smoker quit many yrs ago  Vaping Use   Vaping Use: Never used  Substance and Sexual Activity   Alcohol use: Never   Drug use: Never   Sexual activity: Not  Currently  Other Topics Concern   Not on file  Social History Narrative   ** Merged History Encounter **       Lives in [place], widowed 2003   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         Social Determinants of Health   Financial Resource Strain: [place]  (05/15/2022)   Overall Financial Resource Strain (CARDIA)    Difficulty of Paying Living Expenses: Not hard at all  Food Insecurity: No [place] (05/15/2022)   Hunger Vital Sign    Worried About Running Out of Food in the Last Year: Never true    [place] in the Last Year: Never true  Transportation Needs: No Transportation Needs (05/15/2022)   PRAPARE - Hydrologist (Medical): No    Lack of Transportation (Non-Medical): No  Physical Activity: Sufficiently Active (05/15/2022)   Exercise Vital Sign    Days of Exercise per Week: 5 days    Minutes of Exercise per Session: 60 min  Stress: No Stress Concern Present (05/15/2022)   [place]    Feeling of Stress : Not at all  Social Connections: Moderately Integrated (05/15/2022)   Social Connection and Isolation Panel [NHANES]    Frequency of Communication with Friends and Family: More than three times a week    Frequency of Social Gatherings with Friends and Family: More than three times a week    Attends Religious Services: More than 4 times per year    Active Member of [employer] or Organizations: Yes    Attends Archivist Meetings: More than 4 times per year    Marital Status: Widowed  Intimate Partner Violence: Not At Risk (05/15/2022)   Humiliation, Afraid, Rape, and Kick questionnaire    Fear of Current or Ex-Partner: No    Emotionally Abused: No    Physically Abused: No    Sexually Abused: No    Outpatient Medications Prior to Visit  Medication Sig Dispense Refill   acetaminophen (TYLENOL) 500 MG tablet Take 1,000 mg by mouth every 6 (six) hours as  needed for moderate pain.     APPLE CIDER VINEGAR PO Take 5 mLs by mouth daily.      atenolol (TENORMIN) 25 MG tablet TAKE 1 TABLET (25 MG TOTAL) BY MOUTH DAILY. 90 tablet 1   Cholecalciferol (VITAMIN D3) 50 MCG (2000 UT) TABS Take by mouth.     ELIQUIS 5 MG TABS tablet TAKE 1 TABLET BY MOUTH TWICE A DAY 180 tablet  1   EPINEPHrine (EPIPEN 2-PAK) 0.3 mg/0.3 mL IJ SOAJ injection Inject 0.3 mg into the muscle as needed for anaphylaxis. 1 each 2   Evolocumab (REPATHA SURECLICK) 500 MG/ML SOAJ Inject 1 Pen into the skin every 14 (fourteen) days. 2 mL 11   levocetirizine (XYZAL) 5 MG tablet Can use up to 2 tablets in the morning and 2 tablets at night for hives/itching. This is maximum dose. Titrate as needed. 120 tablet 3   levothyroxine (SYNTHROID) 100 MCG tablet TAKE 1 TABLET BY MOUTH EVERY DAY 90 tablet 3   levothyroxine (SYNTHROID) 75 MCG tablet TAKE 1 TABLET BY MOUTH EVERY DAY 90 tablet 0   Multiple Vitamin (MULTIVITAMIN) capsule Take 1 capsule by mouth daily.     ondansetron (ZOFRAN) 4 MG tablet Take 1 tablet (4 mg total) by mouth every 8 (eight) hours as needed for nausea or vomiting. 20 tablet 0   pantoprazole (PROTONIX) 40 MG tablet Take 1 tablet (40 mg total) by mouth daily. 90 tablet 3   No facility-administered medications prior to visit.    Allergies  Allergen Reactions   Benicar Hct [Olmesartan Medoxomil-Hctz] Shortness Of Breath and Palpitations   Iodinated Contrast Media Hives and Itching    02/02/2021-  pt developed 2 hives and itching even with the 13hr premedication.  Radiology PA came into eval the pt.  Developed itching and hives after injection on 05/10/20; needs 13hr prep in future   Crestor [Rosuvastatin] Other (See Comments)    Muscle aches   Bactrim [Sulfamethoxazole-Trimethoprim] Other (See Comments)    Abdominal pain, dizziness   Pravachol [Pravastatin] Other (See Comments)    Lower abdominal pain   Shellfish Allergy Nausea And Vomiting   Amlodipine Palpitations     ROS     Objective:    Physical Exam Constitutional:      General: She is not in acute distress.    Appearance: She is not ill-appearing.  Cardiovascular:     Rate and Rhythm: Normal rate.  Pulmonary:     Effort: Pulmonary effort is normal.     Breath sounds: Normal breath sounds.  Abdominal:     General: There is no distension.     Palpations: Abdomen is soft.     Tenderness: There is no abdominal tenderness. There is no right CVA tenderness or left CVA tenderness.  Musculoskeletal:     Thoracic back: Normal.     Comments: Good sensation and ROM of back. Pain with certain movements.   Skin:    General: Skin is warm and dry.  Neurological:     General: No focal deficit present.     Mental Status: She is alert and oriented to person, place, and time.  Psychiatric:        Mood and Affect: Mood normal.        Behavior: Behavior normal.        Thought Content: Thought content normal.     BP 128/84 (BP Location: Left Arm, Patient Position: Sitting, Cuff Size: Large)   Pulse 60   Temp 97.6 F (36.4 C) (Temporal)   Ht '5\' 4"'$  (1.626 m)   Wt 214 lb (97.1 kg)   SpO2 99%   BMI 36.73 kg/m  Wt Readings from Last 3 Encounters:  11/19/22 214 lb (97.1 kg)  11/13/22 213 lb (96.6 kg)  11/07/22 214 lb (97.1 kg)       Assessment & Plan:   Problem List Items Addressed This Visit   None Visit Diagnoses  Recurrent hematuria    -  Primary   Relevant Orders   POCT urinalysis dipstick (Completed)   Urine Microscopic (Completed)   Ambulatory referral to Urology   Acute bilateral low back pain without sciatica       Relevant Orders   Ambulatory referral to Physical Therapy      Asymptomatic hematuria. Urine microscopy ordered.  Discussed that if she has a significant amount of urine that I will refer her to urology.  Back pain improved but would like more assistance with stretches and strengthening of her back. Follow up with PCP as recommended.   I am having Tymia Streb. Brunner maintain her APPLE CIDER VINEGAR PO, multivitamin, acetaminophen, Vitamin D3, EPINEPHrine, levocetirizine, ondansetron, pantoprazole, atenolol, Eliquis, Repatha SureClick, levothyroxine, and levothyroxine.  No orders of the defined types were placed in this encounter.

## 2022-11-20 ENCOUNTER — Ambulatory Visit: Payer: Medicare Other | Attending: Internal Medicine | Admitting: Internal Medicine

## 2022-11-20 ENCOUNTER — Encounter: Payer: Self-pay | Admitting: Internal Medicine

## 2022-11-20 ENCOUNTER — Other Ambulatory Visit: Payer: Self-pay | Admitting: Internal Medicine

## 2022-11-20 VITALS — BP 124/82 | HR 68 | Ht 64.0 in | Wt 216.6 lb

## 2022-11-20 DIAGNOSIS — T466X5A Adverse effect of antihyperlipidemic and antiarteriosclerotic drugs, initial encounter: Secondary | ICD-10-CM | POA: Diagnosis not present

## 2022-11-20 DIAGNOSIS — M791 Myalgia, unspecified site: Secondary | ICD-10-CM

## 2022-11-20 DIAGNOSIS — I700 Atherosclerosis of aorta: Secondary | ICD-10-CM

## 2022-11-20 DIAGNOSIS — E785 Hyperlipidemia, unspecified: Secondary | ICD-10-CM | POA: Diagnosis not present

## 2022-11-20 NOTE — Patient Instructions (Signed)
Medication Instructions:  NO CHANGES  *If you need a refill on your cardiac medications before your next appointment, please call your pharmacy*   Lab Work: FASTING lab work to check cholesterol in 3-4 months -- complete about 1 week before next visit   If you have labs (blood work) drawn today and your tests are completely normal, you will receive your results only by: [place] (if you have MyChart) OR A paper copy in the mail If you have any lab test that is abnormal or we need to change your treatment, we will call you to review the results.   Testing/Procedures: NONE   Follow-Up: At [facility], you and your health needs are our priority.  As part of our continuing mission to provide you with exceptional heart care, we have created designated Provider Care Teams.  These Care Teams include your primary Cardiologist (physician) and Advanced Practice Providers (APPs -  Physician Assistants and Nurse Practitioners) who all work together to provide you with the care you need, when you need it.  We recommend signing up for the patient portal called "MyChart".  Sign up information is provided on this After Visit Summary.  MyChart is used to connect with patients for Virtual Visits (Telemedicine).  Patients are able to view lab/test results, encounter notes, upcoming appointments, etc.  Non-urgent messages can be sent to your provider as well.   To learn more about what you can do with MyChart, go to [email or web address].    Your next appointment:    3-4 months with Dr. Debara Pickett

## 2022-11-20 NOTE — Progress Notes (Unsigned)
Office Visit    Patient Name: Tina Stone Date of Encounter: 11/20/2022  Primary Care Provider:  [name], MD Primary Cardiologist:  Sinclair Grooms, MD Primary Electrophysiologist: None  Chief Complaint    Tina Stone is a 72 y.o. female with PMH of PAF, HTN, obesity, HLD, GERD, statin intolerant who presents today for follow-up.  Past Medical History    Past Medical History:  Diagnosis Date   Adenomatous colon polyp    Allergic rhinitis, seasonal    Allergy    Atrial fibrillation ([place])    Beta thalassemia trait 11/25/2013   Cholelithiasis    Class 3 obesity without serious comorbidity with body mass index (BMI) of 40.0 to 44.9 in adult 11/19/2012   endometrial ca dx'd 08/2009   endometrial    GERD (gastroesophageal reflux disease)    H. pylori infection    History of radiation therapy    endometrial - 02/19/2022-03/23/2022 Dr Gery Pray   HLD (hyperlipidemia)    Hypercholesterolemia    Hypertension 03/18/2017   no meds    Hypothyroidism    Intraductal papilloma of left breast    Patient underwent left needle-localized lumpectomy by Dr. Imogene Burn. Tsuei on 09/09/2013; pathology showed intraductal papilloma with no atypia or malignancy identified.   PONV (postoperative nausea and vomiting)    Pre-diabetes    pt denies   Urticaria    Uterine fibroid    Past Surgical History:  Procedure Laterality Date   ANKLE FRACTURE SURGERY Right 1966   right   BREAST EXCISIONAL BIOPSY Left 2014   benign   BREAST LUMPECTOMY WITH NEEDLE LOCALIZATION Left 09/09/2013   Procedure: BREAST LUMPECTOMY WITH NEEDLE LOCALIZATION;  Surgeon: Imogene Burn. Georgette Dover, MD;  Location: [facility];  Service: General;  Laterality: Left;   CHOLECYSTECTOMY     COLONOSCOPY     IR IMAGING GUIDED PORT INSERTION  05/18/2020   POLYPECTOMY     ROBOTIC ASSISTED LAPAROSCOPIC CHOLECYSTECTOMY  09/09/2019   ROBOTIC ASSISTED TOTAL HYSTERECTOMY WITH BILATERAL SALPINGO OOPHERECTOMY N/A 10/22/2019    Procedure: XI ROBOTIC ASSISTED TOTAL HYSTERECTOMY WITH BILATERAL SALPINGO OOPHORECTOMY GREATER THAN 250 GRAMS, MINI LAPARTOMY FOR SPECIMEN DELIVERY; PELVIC AND PERI-AORTIC LYMPHADENECTOMY;  Surgeon: Everitt Amber, MD;  Location: [facility];  Service: Gynecology;  Laterality: N/A;   SENTINEL NODE BIOPSY N/A 10/22/2019   Procedure: SENTINEL NODE BIOPSY;  Surgeon: Everitt Amber, MD;  Location: [facility];  Service: Gynecology;  Laterality: N/A;    Allergies  Allergies  Allergen Reactions   Benicar Hct [Olmesartan Medoxomil-Hctz] Shortness Of Breath and Palpitations   Iodinated Contrast Media Hives and Itching    02/02/2021-  pt developed 2 hives and itching even with the 13hr premedication.  Radiology PA came into eval the pt.  Developed itching and hives after injection on 05/10/20; needs 13hr prep in future   Crestor [Rosuvastatin] Other (See Comments)    Muscle aches   Bactrim [Sulfamethoxazole-Trimethoprim] Other (See Comments)    Abdominal pain, dizziness   Pravachol [Pravastatin] Other (See Comments)    Lower abdominal pain   Shellfish Allergy Nausea And Vomiting   Amlodipine Palpitations    History of Present Illness    Tina Stone  is a 72 year old female with the above mention past medical history who presents today for follow-up.  She was initially seen by Dr. Tamala Julian and 2022 for new onset atrial fibrillation.  She was seen in the AF clinic with recommendation for long-term anticoagulation.  She is  currently on Eliquis 5 mg twice daily.  Patient denied loop recorder and an event monitor in the past.  She was recently seen by Dr. Debara Pickett for follow-up of Repatha management.  Her cholesterol was noted to not be at goal currently and she was advised to increase physical activity and moderate diet for better control.  Since last being seen in the office patient reports she has been doing well with no cardiac complaints noted.  She is currently participating in water aerobics and denies any  chest pain or shortness of breath with activity.  She is compliant with her current medications and denies any adverse reactions.  During our visit we discussed the importance of primary prevention and preventing progression of cardiac disease.  She reported some back pain that she feels may be associated with Repatha.  Advised her to send a message to Dr. Debara Pickett and report these findings for recommendations.  She expressed full understanding and will work on her diet and increasing physical activity in the future.  Patient denies chest pain, palpitations, dyspnea, PND, orthopnea, nausea, vomiting, dizziness, syncope, edema, weight gain, or early satiety.   Home Medications    Current Outpatient Medications  Medication Sig Dispense Refill   acetaminophen (TYLENOL) 500 MG tablet Take 1,000 mg by mouth every 6 (six) hours as needed for moderate pain.     APPLE CIDER VINEGAR PO Take 5 mLs by mouth daily.      atenolol (TENORMIN) 25 MG tablet TAKE 1 TABLET (25 MG TOTAL) BY MOUTH DAILY. 90 tablet 1   Cholecalciferol (VITAMIN D3) 50 MCG (2000 UT) TABS Take by mouth.     ELIQUIS 5 MG TABS tablet TAKE 1 TABLET BY MOUTH TWICE A DAY 180 tablet 1   EPINEPHrine (EPIPEN 2-PAK) 0.3 mg/0.3 mL IJ SOAJ injection Inject 0.3 mg into the muscle as needed for anaphylaxis. 1 each 2   Evolocumab (REPATHA SURECLICK) 063 MG/ML SOAJ Inject 1 Pen into the skin every 14 (fourteen) days. 2 mL 11   levocetirizine (XYZAL) 5 MG tablet Can use up to 2 tablets in the morning and 2 tablets at night for hives/itching. This is maximum dose. Titrate as needed. 120 tablet 3   levothyroxine (SYNTHROID) 100 MCG tablet TAKE 1 TABLET BY MOUTH EVERY DAY 90 tablet 3   levothyroxine (SYNTHROID) 75 MCG tablet TAKE 1 TABLET BY MOUTH EVERY DAY 90 tablet 0   Multiple Vitamin (MULTIVITAMIN) capsule Take 1 capsule by mouth daily.     ondansetron (ZOFRAN) 4 MG tablet Take 1 tablet (4 mg total) by mouth every 8 (eight) hours as needed for nausea or  vomiting. 20 tablet 0   No current facility-administered medications for this visit.     Review of Systems  Please see the history of present illness.    (+) Back pain All other systems reviewed and are otherwise negative except as noted above.  Physical Exam    Wt Readings from Last 3 Encounters:  11/20/22 216 lb 9.6 oz (98.2 kg)  11/19/22 214 lb (97.1 kg)  11/13/22 213 lb (96.6 kg)   KZ:SWFUX were no vitals filed for this visit.,There is no height or weight on file to calculate BMI.  Constitutional:      Appearance: Healthy appearance. Not in distress.  Neck:     Vascular: JVD normal.  Pulmonary:     Effort: Pulmonary effort is normal.     Breath sounds: No wheezing. No rales. Diminished in the bases Cardiovascular:  Normal rate. Regular rhythm. Normal S1. Normal S2.      Murmurs: There is no murmur.  Edema:    Peripheral edema absent.  Abdominal:     Palpations: Abdomen is soft non tender. There is no hepatomegaly.  Skin:    General: Skin is warm and dry.  Neurological:     General: No focal deficit present.     Mental Status: Alert and oriented to person, place and time.     Cranial Nerves: Cranial nerves are intact.  EKG/LABS/Other Studies Reviewed    ECG personally reviewed by me today -sinus rhythm with rate of 62 bpm with normal QT interval of 410 ms and no acute changes noted.  Risk Assessment/Calculations:    CHA2DS2-VASc Score = 4   This indicates a 4.8% annual risk of stroke. The patient's score is based upon: CHF History: 0 HTN History: 1 Diabetes History: 1 Stroke History: 0 Vascular Disease History: 0 Age Score: 1 Gender Score: 1     STOP-Bang Score:  3        Lab Results  Component Value Date   WBC 4.6 11/13/2022   HGB 12.0 11/13/2022   HCT 37.6 11/13/2022   MCV 78.4 11/13/2022   PLT 204.0 11/13/2022   Lab Results  Component Value Date   CREATININE 0.86 11/13/2022   BUN 12 11/13/2022   NA 139 11/13/2022   K 4.1 11/13/2022    CL 103 11/13/2022   CO2 33 (H) 11/13/2022   Lab Results  Component Value Date   ALT 12 11/13/2022   AST 17 11/13/2022   ALKPHOS 110 11/13/2022   BILITOT 0.3 11/13/2022   Lab Results  Component Value Date   CHOL 219 (H) 10/17/2022   HDL 84 10/17/2022   LDLCALC 125 (H) 10/17/2022   TRIG 55 10/17/2022   CHOLHDL 2.6 10/17/2022    Lab Results  Component Value Date   HGBA1C 5.3 02/02/2022    Assessment & Plan    1.  Aortic atherosclerosis: -Patient had mild aortic atherosclerosis on CT 01/2022.  She was unable to complete calcium score due to affordability. -Continue GDMT with Repatha and currently not on aspirin due to Eliquis -Patient was encouraged to increase physical activity to 150 minutes/week  2.  Hyperlipidemia: -She is currently followed by Dr. Debara Pickett in the lipid clinic.  She has a history of myalgias and is currently on Repatha -Patient's last LDL was 125 and not at goal  3.  Essential hypertension: -Patient's blood pressure today was well-controlled at 120/80 -Continue atenolol 25 mg daily  4.  Obesity: -Patient's current BMI is 36.56 kg/m  -She was encouraged to increase physical activity and is currently contacting PCP regarding implementation of Ozempic  5.  Paroxysmal AF: -Patient currently rate controlled with heart of 62 bpm -Continue atenolol 25 mg daily -Patient currently on Eliquis 5 mg twice daily and dose appropriate based on most recent creatinine -Patient denies any bleeding in urine or stool -CHA2DS2-VASc Score = 4 [CHF History: 0, HTN History: 1, Diabetes History: 1, Stroke History: 0, Vascular Disease History: 0, Age Score: 1, Gender Score: 1].  Therefore, the patient's annual risk of stroke is 4.8 %.       Disposition: Follow-up with Sinclair Grooms, MD as scheduled   Medication Adjustments/Labs and Tests Ordered: Current medicines are reviewed at length with the patient today.  Concerns regarding medicines are outlined above.    Signed, Mable Fill, Marissa Nestle, NP 11/20/2022, 1:07 PM [facility]  Note:  This document was prepared using Dragon voice recognition software and may include unintentional dictation errors.

## 2022-11-20 NOTE — Progress Notes (Signed)
LIPID CLINIC CONSULT NOTE  Chief Complaint:  Manage dyslipidemia  Primary Care Physician: [name], MD  Primary Cardiologist:  Sinclair Grooms, MD  HPI:  Tina Stone is a 72 y.o. female who is being seen today for the evaluation of dyslipidemia at the request of Belva Crome, MD. this is a pleasant 72 year old female kindly referred for evaluation management of dyslipidemia.  She has a history of atrial fibrillation, hypertension and dyslipidemia with aortic atherosclerosis.  Unfortunately she has been intolerant to statins.  Most recently she was tried on rosuvastatin but has previously had muscle aches on atorvastatin and pravastatin.  She then was seen by our lipid clinic pharmacist who started her on [place] back in August.  She believes she has had at least 3 injections prior to repeat lab work in October which showed total cholesterol 219, HDL 55, triglycerides 84 and LDL 125.  The LDL had come down from 167, but this would be considered a suboptimal response, much less than the expected 50 to 65%.  There may be several reasons for this.  A change in diet or decrease in activity that was concomitant with starting the medicine might explain it.  Ms. administration of the medication could be an issue but it does not sound like she is having issues with that.  The other possibilities are mutations a PCSK9 or perhaps high LP(a) which seems to be a more common issue.  She also asked about Ozempic today and was interested in a prescription for it.  PMHx:  Past Medical History:  Diagnosis Date   Adenomatous colon polyp    Allergic rhinitis, seasonal    Allergy    Atrial fibrillation ([place])    Beta thalassemia trait 11/25/2013   Cholelithiasis    Class 3 obesity without serious comorbidity with body mass index (BMI) of 40.0 to 44.9 in adult 11/19/2012   endometrial ca dx'd 08/2009   endometrial    GERD (gastroesophageal reflux disease)    H. pylori infection    History of  radiation therapy    endometrial - 02/19/2022-03/23/2022 Dr Gery Pray   HLD (hyperlipidemia)    Hypercholesterolemia    Hypertension 03/18/2017   no meds    Hypothyroidism    Intraductal papilloma of left breast    Patient underwent left needle-localized lumpectomy by Dr. Imogene Burn. Tsuei on 09/09/2013; pathology showed intraductal papilloma with no atypia or malignancy identified.   PONV (postoperative nausea and vomiting)    Pre-diabetes    pt denies   Urticaria    Uterine fibroid     Past Surgical History:  Procedure Laterality Date   ANKLE FRACTURE SURGERY Right 1966   right   BREAST EXCISIONAL BIOPSY Left 2014   benign   BREAST LUMPECTOMY WITH NEEDLE LOCALIZATION Left 09/09/2013   Procedure: BREAST LUMPECTOMY WITH NEEDLE LOCALIZATION;  Surgeon: Imogene Burn. Georgette Dover, MD;  Location: [facility];  Service: General;  Laterality: Left;   CHOLECYSTECTOMY     COLONOSCOPY     IR IMAGING GUIDED PORT INSERTION  05/18/2020   POLYPECTOMY     ROBOTIC ASSISTED LAPAROSCOPIC CHOLECYSTECTOMY  09/09/2019   ROBOTIC ASSISTED TOTAL HYSTERECTOMY WITH BILATERAL SALPINGO OOPHERECTOMY N/A 10/22/2019   Procedure: XI ROBOTIC ASSISTED TOTAL HYSTERECTOMY WITH BILATERAL SALPINGO OOPHORECTOMY GREATER THAN 250 GRAMS, MINI LAPARTOMY FOR SPECIMEN DELIVERY; PELVIC AND PERI-AORTIC LYMPHADENECTOMY;  Surgeon: Everitt Amber, MD;  Location: [facility];  Service: Gynecology;  Laterality: N/A;   SENTINEL NODE BIOPSY N/A 10/22/2019  Procedure: SENTINEL NODE BIOPSY;  Surgeon: Everitt Amber, MD;  Location: [facility];  Service: Gynecology;  Laterality: N/A;    FAMHx:  Family History  Problem Relation Age of Onset   Diabetes Mother    Hypertension Mother    Colon polyps Mother 86   Dementia Mother 75   Diabetes Father    Congestive Heart Failure Father    Asthma Brother    Diabetes Brother    Hypertension Brother    Hypertension Brother    Hypertension Maternal Grandmother    Diabetes Maternal Grandmother    Asthma Paternal  Grandmother    Lung disease Paternal Grandmother    Diabetes Paternal Grandfather    Pancreatic cancer Paternal Aunt    Uterine cancer Paternal Aunt    Colon cancer Neg Hx    Breast cancer Neg Hx    Lung cancer Neg Hx    Esophageal cancer Neg Hx    Rectal cancer Neg Hx    Stomach cancer Neg Hx     SOCHx:   reports that she has never smoked. She has never used smokeless tobacco. She reports that she does not drink alcohol and does not use drugs.  ALLERGIES:  Allergies  Allergen Reactions   Benicar Hct [Olmesartan Medoxomil-Hctz] Shortness Of Breath and Palpitations   Iodinated Contrast Media Hives and Itching    02/02/2021-  pt developed 2 hives and itching even with the 13hr premedication.  Radiology PA came into eval the pt.  Developed itching and hives after injection on 05/10/20; needs 13hr prep in future   Crestor [Rosuvastatin] Other (See Comments)    Muscle aches   Bactrim [Sulfamethoxazole-Trimethoprim] Other (See Comments)    Abdominal pain, dizziness   Pravachol [Pravastatin] Other (See Comments)    Lower abdominal pain   Shellfish Allergy Nausea And Vomiting   Amlodipine Palpitations    ROS: Pertinent items noted in HPI and remainder of comprehensive ROS otherwise negative.  HOME MEDS: Current Outpatient Medications on File Prior to Visit  Medication Sig Dispense Refill   acetaminophen (TYLENOL) 500 MG tablet Take 1,000 mg by mouth every 6 (six) hours as needed for moderate pain.     APPLE CIDER VINEGAR PO Take 5 mLs by mouth daily.      atenolol (TENORMIN) 25 MG tablet TAKE 1 TABLET (25 MG TOTAL) BY MOUTH DAILY. 90 tablet 1   Cholecalciferol (VITAMIN D3) 50 MCG (2000 UT) TABS Take by mouth.     ELIQUIS 5 MG TABS tablet TAKE 1 TABLET BY MOUTH TWICE A DAY 180 tablet 1   EPINEPHrine (EPIPEN 2-PAK) 0.3 mg/0.3 mL IJ SOAJ injection Inject 0.3 mg into the muscle as needed for anaphylaxis. 1 each 2   Evolocumab (REPATHA SURECLICK) 893 MG/ML SOAJ Inject 1 Pen into the  skin every 14 (fourteen) days. 2 mL 11   levocetirizine (XYZAL) 5 MG tablet Can use up to 2 tablets in the morning and 2 tablets at night for hives/itching. This is maximum dose. Titrate as needed. 120 tablet 3   levothyroxine (SYNTHROID) 100 MCG tablet TAKE 1 TABLET BY MOUTH EVERY DAY 90 tablet 3   levothyroxine (SYNTHROID) 75 MCG tablet TAKE 1 TABLET BY MOUTH EVERY DAY 90 tablet 0   Multiple Vitamin (MULTIVITAMIN) capsule Take 1 capsule by mouth daily.     ondansetron (ZOFRAN) 4 MG tablet Take 1 tablet (4 mg total) by mouth every 8 (eight) hours as needed for nausea or vomiting. 20 tablet 0   No current facility-administered medications  on file prior to visit.    LABS/IMAGING: Results for orders placed or performed in visit on 11/19/22 (from the past 48 hour(s))  POCT urinalysis dipstick     Status: None   Collection Time: 11/19/22  8:53 AM  Result Value Ref Range   Color, UA pale yellow    Clarity, UA clear    Glucose, UA Negative Negative   Bilirubin, UA negative    Ketones, UA negative    Spec Grav, UA 1.015 1.010 - 1.025   Blood, UA Positive     Comment: 25 Ery/uL   pH, UA 6.0 5.0 - 8.0   Protein, UA Negative Negative   Urobilinogen, UA 0.2 0.2 or 1.0 E.U./dL   Nitrite, UA negative    Leukocytes, UA Negative Negative   Appearance     Odor    Urine Microscopic     Status: Abnormal   Collection Time: 11/19/22  8:56 AM  Result Value Ref Range   WBC, UA 0-2/hpf 0-2/hpf   RBC / HPF 3-6/hpf (A) 0-2/hpf   Squamous Epithelial / LPF Rare(0-4/hpf) Rare(0-4/hpf)   No results found.  LIPID PANEL:    Component Value Date/Time   CHOL 219 (H) 10/17/2022 0910   TRIG 55 10/17/2022 0910   HDL 84 10/17/2022 0910   CHOLHDL 2.6 10/17/2022 0910   CHOLHDL 4 07/09/2022 0845   VLDL 12.6 07/09/2022 0845   LDLCALC 125 (H) 10/17/2022 0910    WEIGHTS: Wt Readings from Last 3 Encounters:  11/20/22 216 lb 9.6 oz (98.2 kg)  11/19/22 214 lb (97.1 kg)  11/13/22 213 lb (96.6 kg)     VITALS: BP 124/82 (BP Location: Left Arm, Patient Position: Sitting)   Pulse 68   Ht '5\' 4"'$  (1.626 m)   Wt 216 lb 9.6 oz (98.2 kg)   SpO2 97%   BMI 37.18 kg/m   EXAM: Deferred  EKG: Deferred  ASSESSMENT: Mixed dyslipidemia, goal LDL less than 70 Statin intolerant-myalgias Aortic atherosclerosis Hypertension PAF  PLAN: 1.   Tina Stone has had a suboptimal response to her Repatha.  The most likely explanation for this is a high LP(a).  It is also possible that she had not had enough therapy prior to her labs in October but I think it is likely she did.  She seems to be administering it properly.  Diet and activity level are not as good as they had been previously which could be partially responsible for that.  She wants to work hard on that over the holidays.  She is interesting in possibly starting on Ozempic.  I defer to her PCP on this.  I will repeat a lipid NMR and LP(a) in about 3 to 4 months and follow-up at that time.  Pixie Casino, MD, [facility], [place] Director of the Advanced Lipid Disorders &  Cardiovascular Risk Reduction Clinic Diplomate of the American Board of Clinical Lipidology Attending Cardiologist  Direct Dial: [phone]  Fax: [phone]  Website:  [email or web address] Ardelean 11/20/2022, 8:50 AM

## 2022-11-21 ENCOUNTER — Other Ambulatory Visit: Payer: Self-pay | Admitting: Internal Medicine

## 2022-11-21 ENCOUNTER — Encounter: Payer: Self-pay | Admitting: Internal Medicine

## 2022-11-21 ENCOUNTER — Encounter: Payer: Self-pay | Admitting: Nurse Practitioner

## 2022-11-21 ENCOUNTER — Ambulatory Visit: Payer: Medicare Other | Attending: Nurse Practitioner | Admitting: Nurse Practitioner

## 2022-11-21 ENCOUNTER — Telehealth: Payer: Self-pay | Admitting: Internal Medicine

## 2022-11-21 VITALS — BP 120/80 | HR 62 | Ht 64.0 in | Wt 213.0 lb

## 2022-11-21 DIAGNOSIS — I700 Atherosclerosis of aorta: Secondary | ICD-10-CM

## 2022-11-21 DIAGNOSIS — I10 Essential (primary) hypertension: Secondary | ICD-10-CM

## 2022-11-21 DIAGNOSIS — E669 Obesity, unspecified: Secondary | ICD-10-CM

## 2022-11-21 DIAGNOSIS — I480 Paroxysmal atrial fibrillation: Secondary | ICD-10-CM | POA: Diagnosis not present

## 2022-11-21 DIAGNOSIS — K219 Gastro-esophageal reflux disease without esophagitis: Secondary | ICD-10-CM

## 2022-11-21 DIAGNOSIS — E785 Hyperlipidemia, unspecified: Secondary | ICD-10-CM | POA: Diagnosis not present

## 2022-11-21 MED ORDER — PANTOPRAZOLE SODIUM 40 MG PO TBEC: 40.0000 mg | DELAYED_RELEASE_TABLET | Freq: Every day | ORAL | 0 refills | Status: DC

## 2022-11-21 NOTE — Patient Instructions (Signed)
Medication Instructions:  Your physician recommends that you continue on your current medications as directed. Please refer to the Current Medication list given to you today. *If you need a refill on your cardiac medications before your next appointment, please call your pharmacy*   Lab Work: None Ordered If you have labs (blood work) drawn today and your tests are completely normal, you will receive your results only by: [place] (if you have MyChart) OR A paper copy in the mail If you have any lab test that is abnormal or we need to change your treatment, we will call you to review the results.   Testing/Procedures: None Ordered   Follow-Up: At [facility], you and your health needs are our priority.  As part of our continuing mission to provide you with exceptional heart care, we have created designated Provider Care Teams.  These Care Teams include your primary Cardiologist (physician) and Advanced Practice Providers (APPs -  Physician Assistants and Nurse Practitioners) who all work together to provide you with the care you need, when you need it.  We recommend signing up for the patient portal called "MyChart".  Sign up information is provided on this After Visit Summary.  MyChart is used to connect with patients for Virtual Visits (Telemedicine).  Patients are able to view lab/test results, encounter notes, upcoming appointments, etc.  Non-urgent messages can be sent to your provider as well.   To learn more about what you can do with MyChart, go to [email or web address].    Your next appointment:   AS SCHEDULED  The format for your next appointment:   In Person  Provider:   K. Mali Hilty, MD    Other Instructions   Important Information About Sugar

## 2022-11-21 NOTE — Telephone Encounter (Signed)
Caller: (PATIENT ),  Relationship: (SELF )  Call Back Number: [phone]  Date of Last Office Visit:11/13/22  Date of Next Office Visit: 01/09/23  Medication(s) to be Refilled: PROTONIX  Last Written:   patient says she used to get this from PCP and pt says she took it off her medication list accidentally   [facility]erred Pharmacy: [id number] [place], [place], [place] [address]Phone: [phone] Fax: [phone]

## 2022-11-26 ENCOUNTER — Encounter: Payer: Self-pay | Admitting: Family Medicine

## 2022-11-27 NOTE — Telephone Encounter (Signed)
Patient called back concerning this (her ozempic) please advise.

## 2022-11-29 NOTE — Therapy (Addendum)
OUTPATIENT PHYSICAL THERAPY THORACOLUMBAR EVALUATION /DISCHARGE   Patient Name: Tina Stone MRN: [id number] DOB:Jan 22, 1950, 72 y.o., female Today's Date: 11/30/2022  END OF SESSION:  PT End of Session - 11/30/22 0925     Visit Number 1    Number of Visits 4    Date for PT Re-Evaluation 01/09/23    Authorization Type [employer] MEDICARE    Progress Note Due on Visit 10    PT Start Time 0850    PT Stop Time 0922    PT Time Calculation (min) 32 min    Activity Tolerance Patient tolerated treatment well    Behavior During Therapy [facility] for tasks assessed/performed             Past Medical History:  Diagnosis Date   Adenomatous colon polyp    Allergic rhinitis, seasonal    Allergy    Atrial fibrillation ([place])    Beta thalassemia trait 11/25/2013   Cholelithiasis    Class 3 obesity without serious comorbidity with body mass index (BMI) of 40.0 to 44.9 in adult 11/19/2012   endometrial ca dx'd 08/2009   endometrial    GERD (gastroesophageal reflux disease)    H. pylori infection    History of radiation therapy    endometrial - 02/19/2022-03/23/2022 Dr Gery Pray   HLD (hyperlipidemia)    Hypercholesterolemia    Hypertension 03/18/2017   no meds    Hypothyroidism    Intraductal papilloma of left breast    Patient underwent left needle-localized lumpectomy by Dr. Imogene Burn. Tsuei on 09/09/2013; pathology showed intraductal papilloma with no atypia or malignancy identified.   PONV (postoperative nausea and vomiting)    Pre-diabetes    pt denies   Urticaria    Uterine fibroid    Past Surgical History:  Procedure Laterality Date   ANKLE FRACTURE SURGERY Right 1966   right   BREAST EXCISIONAL BIOPSY Left 2014   benign   BREAST LUMPECTOMY WITH NEEDLE LOCALIZATION Left 09/09/2013   Procedure: BREAST LUMPECTOMY WITH NEEDLE LOCALIZATION;  Surgeon: Imogene Burn. Georgette Dover, MD;  Location: [facility];  Service: General;  Laterality: Left;   CHOLECYSTECTOMY     COLONOSCOPY     IR IMAGING  GUIDED PORT INSERTION  05/18/2020   POLYPECTOMY     ROBOTIC ASSISTED LAPAROSCOPIC CHOLECYSTECTOMY  09/09/2019   ROBOTIC ASSISTED TOTAL HYSTERECTOMY WITH BILATERAL SALPINGO OOPHERECTOMY N/A 10/22/2019   Procedure: XI ROBOTIC ASSISTED TOTAL HYSTERECTOMY WITH BILATERAL SALPINGO OOPHORECTOMY GREATER THAN 250 GRAMS, MINI LAPARTOMY FOR SPECIMEN DELIVERY; PELVIC AND PERI-AORTIC LYMPHADENECTOMY;  Surgeon: Everitt Amber, MD;  Location[facility];  Service: Gynecology;  Laterality: N/A;   SENTINEL NODE BIOPSY N/A 10/22/2019   Procedure: SENTINEL NODE BIOPSY;  Surgeon: Everitt Amber, MD;  Location[facility];  Service: Gynecology;  Laterality: N/A;   Patient Active Problem List   Diagnosis Date Noted   Right hip pain 08/28/2022   Aortic atherosclerosis ([place]) 08/01/2022   Chronic idiopathic urticaria 11/22/2021   Chronic anticoagulation 10/02/2021   Visit for screening mammogram 08/18/2021   Acquired hypothyroidism 08/18/2021   Allergic rhinoconjunctivitis 08/18/2021   Encounter for general adult medical examination with abnormal findings 08/18/2021   Hyperlipidemia with target LDL less than 100 08/17/2021   Laryngitis, chronic 08/17/2021   Paroxysmal atrial fibrillation ([place]) 02/03/2021   Secondary hypercoagulable state ([place]) 02/03/2021   Essential hypertension 11/16/2020   Vitamin D deficiency 10/27/2020   Endometrial cancer ([place]) 10/22/2019   Beta thalassemia trait 11/25/2013   Class II obesity 11/19/2012   Gastroesophageal  reflux disease 01/28/2007    PCP: [name], MD  REFERRING PROVIDER: Girtha Rm, NP-C  REFERRING DIAG: Acute bilateral low back pain without sciatica [M54.50]   Rationale for Evaluation and Treatment: Rehabilitation  THERAPY DIAG:  Other low back pain - Plan: PT plan of care cert/re-cert  Muscle weakness (generalized) - Plan: PT plan of care cert/re-cert  ONSET DATE: A couple of years.   SUBJECTIVE:                                                                                                                                                                                             SUBJECTIVE STATEMENT: Pt states that she has a long history of lower back pain. Pain has progressively been getting worse. She is currently taking a medication for her high cholesterol which has a side effect of lower back pain. She is unsure if the pain has gotten worse because of the medication or due to degenerative changes. Pain mostly noted at night. She is sleeping elevated due to GERD.   PERTINENT HISTORY:  HTN, Hypothyroidism   PAIN:  Are you having pain? Yes: NPRS scale: 1/10 currently, laying down 4-5/10 Pain location: Bilat lower back  Pain description: Nagging Aggravating factors: Laying down.  Relieving factors: Moving around.   PRECAUTIONS: None  WEIGHT BEARING RESTRICTIONS: No  FALLS:  Has patient fallen in last 6 months? No  LIVING ENVIRONMENT: Lives with: lives with their family Lives in: House/apartment Stairs: No Has following equipment at home: None  OCCUPATION: Caregiver   PLOF: Independent  PATIENT GOALS: Pt would like to eliminate her lower back pain.   NEXT MD VISIT:   OBJECTIVE:   DIAGNOSTIC FINDINGS:  No recent fracture is seen. Severe degenerative changes are noted. There is minimal anterolisthesis at L4-L5 level.  PATIENT SURVEYS:  FOTO 67.78%, 78% in 9 visits.   COGNITION: Overall cognitive status: Within functional limits for tasks assessed     SENSATION: WFL  POSTURE: No Significant postural limitations  PALPATION: None  LUMBAR ROM:   AROM eval  Flexion WFL  Extension Severe limitation   Right lateral flexion WFL  Left lateral flexion WFL  Right rotation WFL  Left rotation WFL   (Blank rows = not tested)  LOWER EXTREMITY ROM:     Active  Right eval Left eval  Hip flexion [facility]  Knee flexion [facility] WFL  Knee extension WFL WFL   (Blank rows = not tested)  LOWER EXTREMITY MMT:    MMT  Right eval Left eval  Hip flexion 4+/5 4+/5  Knee flexion 4+/5 4+/5  Knee extension 4+/5  4+/5   (Blank rows = not tested)  GAIT: Distance walked: 70f  Assistive device utilized: None Level of assistance: Complete Independence Comments: Normal gait   TODAY'S TREATMENT:                                                                                                                              DATE: Creating, reviewing, and completing below HEP    PATIENT EDUCATION:  Education details: Educated pt on anatomy and physiology of current symptoms, FOTO, diagnosis, prognosis, HEP,  and POC. Person educated: Patient Education method: ECustomer service managerEducation comprehension: verbalized understanding and returned demonstration  HOME EXERCISE PROGRAM: Access Code: [email or web address]: [email or web address] Date: 11/30/2022 Prepared by: SRudi Heap Exercises - Supine Lower Trunk Rotation  - 2 x daily - 7 x weekly - 3 sets - 10 reps - Supine Posterior Pelvic Tilt  - 2 x daily - 7 x weekly - 3 sets - 10 reps - Supine Transversus Abdominis Bracing - Hands on Stomach  - 2 x daily - 7 x weekly - 3 sets - 5 hold   ASSESSMENT:  CLINICAL IMPRESSION: Patient referred to PT for lower back pain. She demonstrates normal posture, and functional ROM. Pt is most limited with lumbar extension. She states that her pain is most prominent at night. She is requesting to be seen for one more follow up appt to be provided a comprehensive HEP. Patient will benefit from skilled PT to address below impairments, limitations and improve overall function.  OBJECTIVE IMPAIRMENTS: decreased activity tolerance, difficulty walking, decreased balance, decreased endurance, decreased mobility, decreased ROM, decreased strength, impaired flexibility, impaired UE/LE use, postural dysfunction, and pain.  ACTIVITY LIMITATIONS: bending, lifting, carry, locomotion, cleaning, community activity, driving,  and or occupation  PERSONAL FACTORS: HTN, Hypothyroidism  are also affecting patient's functional outcome.  REHAB POTENTIAL: Good  CLINICAL DECISION MAKING: Stable/uncomplicated  EVALUATION COMPLEXITY: Low    GOALS: Short term PT Goals Target date: 12/28/2022 Pt will be I and compliant with HEP. Baseline:  Goal status: New Pt will decrease pain by 25% overall Baseline: Goal status: New  PLAN: PT FREQUENCY:  1x per week   PT DURATION: 4 weeks  PLANNED INTERVENTIONS (unless contraindicated): aquatic PT, Canalith repositioning, cryotherapy, Electrical stimulation, Iontophoresis with 4 mg/ml dexamethasome, Moist heat, traction, Ultrasound, gait training, Therapeutic exercise, balance training, neuromuscular re-education, patient/family education, prosthetic training, manual techniques, passive ROM, dry needling, taping, vasopnuematic device, vestibular, spinal manipulations, joint manipulations  PLAN FOR NEXT SESSION: Assess HEP/update PRN, continue to progress functional mobility, strengthen core, paraspinals and proximal hips. Decrease patients pain and help minimize functional deficits.    SLynden Ang PT 11/30/2022, 9:26 AM   PHYSICAL THERAPY DISCHARGE SUMMARY  Visits from Start of Care: 1  Current functional level related to goals / functional outcomes: See note   Remaining deficits: See note   Education / Equipment: HEP  Patient goals were not met. Patient is being discharged  due to not returning since the last visit.  Scot Jun, PT, DPT, OCS, ATC 01/24/23  3:22 PM

## 2022-11-30 ENCOUNTER — Ambulatory Visit (INDEPENDENT_AMBULATORY_CARE_PROVIDER_SITE_OTHER): Payer: Medicare Other | Admitting: Physical Therapy

## 2022-11-30 ENCOUNTER — Other Ambulatory Visit: Payer: Self-pay

## 2022-11-30 DIAGNOSIS — M6281 Muscle weakness (generalized): Secondary | ICD-10-CM

## 2022-11-30 DIAGNOSIS — M5459 Other low back pain: Secondary | ICD-10-CM

## 2022-12-12 ENCOUNTER — Ambulatory Visit (INDEPENDENT_AMBULATORY_CARE_PROVIDER_SITE_OTHER): Payer: Medicare Other | Admitting: Emergency Medicine

## 2022-12-12 ENCOUNTER — Encounter: Payer: Self-pay | Admitting: Emergency Medicine

## 2022-12-12 ENCOUNTER — Encounter: Payer: Medicare Other | Admitting: Physical Therapy

## 2022-12-12 VITALS — BP 118/70 | HR 65 | Temp 98.6°F | Ht 64.0 in | Wt 215.0 lb

## 2022-12-12 DIAGNOSIS — B9789 Other viral agents as the cause of diseases classified elsewhere: Secondary | ICD-10-CM

## 2022-12-12 DIAGNOSIS — R051 Acute cough: Secondary | ICD-10-CM

## 2022-12-12 DIAGNOSIS — J988 Other specified respiratory disorders: Secondary | ICD-10-CM | POA: Diagnosis not present

## 2022-12-12 LAB — POC COVID19 BINAXNOW: SARS Coronavirus 2 Ag: NEGATIVE

## 2022-12-12 NOTE — Assessment & Plan Note (Signed)
Cough management discussed. Continue over-the-counter Mucinex DM as needed along with cough drops and good hydration.

## 2022-12-12 NOTE — Progress Notes (Signed)
Tina Stone 72 y.o.   Chief Complaint  Patient presents with   covid test    coughing    HISTORY OF PRESENT ILLNESS: Acute problem visit today.  Patient of Dr. Scarlette Calico. This is a 72 y.o. female complaining of dry cough which started about 3 days ago. Recently exposed to [place].  Requesting test. No other associated symptoms. No other complaints or medical concerns today.  HPI   Prior to Admission medications   Medication Sig Start Date End Date Taking? Authorizing Provider  acetaminophen (TYLENOL) 500 MG tablet Take 1,000 mg by mouth every 6 (six) hours as needed for moderate pain.   Yes [provider]  APPLE CIDER VINEGAR PO Take 5 mLs by mouth daily.    Yes [provider]  atenolol (TENORMIN) 25 MG tablet TAKE 1 TABLET (25 MG TOTAL) BY MOUTH DAILY. 07/24/22  Yes [name], MD  Cholecalciferol (VITAMIN D3) 50 MCG (2000 UT) TABS Take by mouth.   Yes [provider]  ELIQUIS 5 MG TABS tablet TAKE 1 TABLET BY MOUTH TWICE A DAY 07/24/22  Yes [name], MD  Evolocumab (REPATHA SURECLICK) 242 MG/ML SOAJ Inject 1 Pen into the skin every 14 (fourteen) days. 08/06/22  Yes Belva Crome, MD  levothyroxine (SYNTHROID) 75 MCG tablet TAKE 1 TABLET BY MOUTH EVERY DAY 11/02/22  Yes [name], MD  Multiple Vitamin (MULTIVITAMIN) capsule Take 1 capsule by mouth daily.   Yes [provider]  pantoprazole (PROTONIX) 40 MG tablet Take 1 tablet (40 mg total) by mouth daily. 11/21/22  Yes [name], MD    Allergies  Allergen Reactions   Benicar Hct [Olmesartan Medoxomil-Hctz] Shortness Of Breath and Palpitations   Iodinated Contrast Media Hives and Itching    02/02/2021-  pt developed 2 hives and itching even with the 13hr premedication.  Radiology PA came into eval the pt.  Developed itching and hives after injection on 05/10/20; needs 13hr prep in future   Crestor [Rosuvastatin] Other (See Comments)    Muscle aches   Bactrim  [Sulfamethoxazole-Trimethoprim] Other (See Comments)    Abdominal pain, dizziness   Pravachol [Pravastatin] Other (See Comments)    Lower abdominal pain   Shellfish Allergy Nausea And Vomiting   Amlodipine Palpitations    Patient Active Problem List   Diagnosis Date Noted   Right hip pain 08/28/2022   Aortic atherosclerosis ([place]) 08/01/2022   Chronic idiopathic urticaria 11/22/2021   Chronic anticoagulation 10/02/2021   Visit for screening mammogram 08/18/2021   Acquired hypothyroidism 08/18/2021   Allergic rhinoconjunctivitis 08/18/2021   Encounter for general adult medical examination with abnormal findings 08/18/2021   Hyperlipidemia with target LDL less than 100 08/17/2021   Laryngitis, chronic 08/17/2021   Paroxysmal atrial fibrillation ([place]) 02/03/2021   Secondary hypercoagulable state ([place]) 02/03/2021   Essential hypertension 11/16/2020   Vitamin D deficiency 10/27/2020   Endometrial cancer ([place]) 10/22/2019   Beta thalassemia trait 11/25/2013   Class II obesity 11/19/2012   Gastroesophageal reflux disease 01/28/2007    Past Medical History:  Diagnosis Date   Adenomatous colon polyp    Allergic rhinitis, seasonal    Allergy    Atrial fibrillation ([place])    Beta thalassemia trait 11/25/2013   Cholelithiasis    Class 3 obesity without serious comorbidity with body mass index (BMI) of 40.0 to 44.9 in adult 11/19/2012   endometrial ca dx'd 08/2009   endometrial    GERD (gastroesophageal reflux disease)  H. pylori infection    History of radiation therapy    endometrial - 02/19/2022-03/23/2022 Dr Gery Pray   HLD (hyperlipidemia)    Hypercholesterolemia    Hypertension 03/18/2017   no meds    Hypothyroidism    Intraductal papilloma of left breast    Patient underwent left needle-localized lumpectomy by Dr. Imogene Burn. Tsuei on 09/09/2013; pathology showed intraductal papilloma with no atypia or malignancy identified.   PONV (postoperative nausea and vomiting)     Pre-diabetes    pt denies   Urticaria    Uterine fibroid     Past Surgical History:  Procedure Laterality Date   ANKLE FRACTURE SURGERY Right 1966   right   BREAST EXCISIONAL BIOPSY Left 2014   benign   BREAST LUMPECTOMY WITH NEEDLE LOCALIZATION Left 09/09/2013   Procedure: BREAST LUMPECTOMY WITH NEEDLE LOCALIZATION;  Surgeon: Imogene Burn. Georgette Dover, MD;  Location: [facility];  Service: General;  Laterality: Left;   CHOLECYSTECTOMY     COLONOSCOPY     IR IMAGING GUIDED PORT INSERTION  05/18/2020   POLYPECTOMY     ROBOTIC ASSISTED LAPAROSCOPIC CHOLECYSTECTOMY  09/09/2019   ROBOTIC ASSISTED TOTAL HYSTERECTOMY WITH BILATERAL SALPINGO OOPHERECTOMY N/A 10/22/2019   Procedure: XI ROBOTIC ASSISTED TOTAL HYSTERECTOMY WITH BILATERAL SALPINGO OOPHORECTOMY GREATER THAN 250 GRAMS, MINI LAPARTOMY FOR SPECIMEN DELIVERY; PELVIC AND PERI-AORTIC LYMPHADENECTOMY;  Surgeon: Everitt Amber, MD;  Location: [facility];  Service: Gynecology;  Laterality: N/A;   SENTINEL NODE BIOPSY N/A 10/22/2019   Procedure: SENTINEL NODE BIOPSY;  Surgeon: Everitt Amber, MD;  Location: [facility];  Service: Gynecology;  Laterality: N/A;    Social History   Socioeconomic History   Marital status: Widowed    Spouse name: Not on file   Number of children: 0   Years of education: Not on file   Highest education level: Not on file  Occupational History   Occupation: Lawyer  Tobacco Use   Smoking status: Never   Smokeless tobacco: Never   Tobacco comments:    few puffs but not a true smoker quit many yrs ago  Vaping Use   Vaping Use: Never used  Substance and Sexual Activity   Alcohol use: Never   Drug use: Never   Sexual activity: Not Currently  Other Topics Concern   Not on file  Social History Narrative   ** Merged History Encounter **       Lives in [place], widowed 2003   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         Social Determinants of Health   Financial Resource Strain: [place]  (05/15/2022)   Overall  Financial Resource Strain (CARDIA)    Difficulty of Paying Living Expenses: Not hard at all  Food Insecurity: No [place] (05/15/2022)   Hunger Vital Sign    Worried About Running Out of Food in the Last Year: Never true    [place] in the Last Year: Never true  Transportation Needs: No Transportation Needs (05/15/2022)   PRAPARE - Hydrologist (Medical): No    Lack of Transportation (Non-Medical): No  Physical Activity: Sufficiently Active (05/15/2022)   Exercise Vital Sign    Days of Exercise per Week: 5 days    Minutes of Exercise per Session: 60 min  Stress: No Stress Concern Present (05/15/2022)   [place]    Feeling of Stress : Not at all  Social Connections: Moderately Integrated (  05/15/2022)   Social Connection and Isolation Panel [NHANES]    Frequency of Communication with Friends and Family: More than three times a week    Frequency of Social Gatherings with Friends and Family: More than three times a week    Attends Religious Services: More than 4 times per year    Active Member of [employer] or Organizations: Yes    Attends Archivist Meetings: More than 4 times per year    Marital Status: Widowed  Intimate Partner Violence: Not At Risk (05/15/2022)   Humiliation, Afraid, Rape, and Kick questionnaire    Fear of Current or Ex-Partner: No    Emotionally Abused: No    Physically Abused: No    Sexually Abused: No    Family History  Problem Relation Age of Onset   Diabetes Mother    Hypertension Mother    Colon polyps Mother 43   Dementia Mother 5   Diabetes Father    Congestive Heart Failure Father    Asthma Brother    Diabetes Brother    Hypertension Brother    Hypertension Brother    Hypertension Maternal Grandmother    Diabetes Maternal Grandmother    Asthma Paternal Grandmother    Lung disease Paternal Grandmother    Diabetes Paternal Grandfather     Pancreatic cancer Paternal Aunt    Uterine cancer Paternal Aunt    Colon cancer Neg Hx    Breast cancer Neg Hx    Lung cancer Neg Hx    Esophageal cancer Neg Hx    Rectal cancer Neg Hx    Stomach cancer Neg Hx      Review of Systems  Constitutional: Negative.  Negative for chills and fever.  HENT: Negative.  Negative for congestion and sore throat.   Respiratory:  Positive for cough.   Cardiovascular: Negative.  Negative for chest pain and palpitations.  Gastrointestinal:  Negative for abdominal pain, nausea and vomiting.  Genitourinary: Negative.  Negative for dysuria and hematuria.  Skin: Negative.  Negative for rash.  Neurological: Negative.  Negative for dizziness and headaches.  All other systems reviewed and are negative.  Today's Vitals   12/12/22 1609  BP: 118/70  Pulse: 65  Temp: 98.6 F (37 C)  TempSrc: Oral  SpO2: 100%  Weight: 215 lb (97.5 kg)  Height: '5\' 4"'$  (1.626 m)   Body mass index is 36.9 kg/m.   Physical Exam Vitals reviewed.  Constitutional:      Appearance: Normal appearance.  HENT:     Head: Normocephalic.     Mouth/Throat:     Mouth: Mucous membranes are moist.     Pharynx: Oropharynx is clear.  Eyes:     Extraocular Movements: Extraocular movements intact.     Pupils: Pupils are equal, round, and reactive to light.  Cardiovascular:     Rate and Rhythm: Normal rate and regular rhythm.     Pulses: Normal pulses.     Heart sounds: Normal heart sounds.  Pulmonary:     Effort: Pulmonary effort is normal.     Breath sounds: Normal breath sounds.  Musculoskeletal:     Cervical back: No tenderness.  Lymphadenopathy:     Cervical: No cervical adenopathy.  Skin:    General: Skin is warm and dry.     Capillary Refill: Capillary refill takes less than 2 seconds.  Neurological:     General: No focal deficit present.     Mental Status: She is alert and oriented to person, place,  and time.  Psychiatric:        Mood and Affect: Mood normal.         Behavior: Behavior normal.    Results for orders placed or performed in visit on 12/12/22 (from the past 24 hour(s))  POC COVID-19     Status: Normal   Collection Time: 12/12/22  4:24 PM  Result Value Ref Range   SARS Coronavirus 2 Ag Negative Negative     ASSESSMENT & PLAN: A total of 32 minutes was spent with the patient and counseling/coordination of care regarding preparing for this visit, review of most recent office visit notes, review of chronic medical problems under management, review of all medications, diagnosis of viral respiratory infection and treatment, prognosis, documentation and need for follow-up.. . Problem List Items Addressed This Visit       Respiratory   Viral respiratory infection - Primary    Running its course without complications. Clinically stable.  No red flag signs or symptoms. Continue over-the-counter medications, rest, and stay well-hydrated.        Other   Acute cough    Cough management discussed. Continue over-the-counter Mucinex DM as needed along with cough drops and good hydration.      Relevant Orders   POC COVID-19 (Completed)   Patient Instructions  Cough, Adult A cough helps to clear your throat and lungs. A cough may be a sign of an illness or another medical condition. An acute cough may only last 2-3 weeks, while a chronic cough may last 8 or more weeks. Many things can cause a cough. They include: Germs (viruses or bacteria) that attack the airway. Breathing in things that bother (irritate) your lungs. Allergies. Asthma. Mucus that runs down the back of your throat (postnasal drip). Smoking. Acid backing up from the stomach into the tube that moves food from the mouth to the stomach (gastroesophageal reflux). Some medicines. Lung problems. Other medical conditions, such as heart failure or a blood clot in the lung (pulmonary embolism). Follow these instructions at home: Medicines Take over-the-counter and  prescription medicines only as told by your doctor. Talk with your doctor before you take medicines that stop a cough (cough suppressants). Lifestyle  Do not smoke, and try not to be around smoke. Do not use any products that contain nicotine or tobacco, such as cigarettes, e-cigarettes, and chewing tobacco. If you need help quitting, ask your doctor. Drink enough fluid to keep your pee (urine) pale yellow. Avoid caffeine. Do not drink alcohol if your doctor tells you not to drink. General instructions  Watch for any changes in your cough. Tell your doctor about them. Always cover your mouth when you cough. Stay away from things that make you cough, such as perfume, candles, campfire smoke, or cleaning products. If the air is dry, use a cool mist vaporizer or humidifier in your home. If your cough is worse at night, try using extra pillows to raise your head up higher while you sleep. Rest as needed. Keep all follow-up visits as told by your doctor. This is important. Contact a doctor if: You have new symptoms. You cough up pus. Your cough does not get better after 2-3 weeks, or your cough gets worse. Cough medicine does not help your cough and you are not sleeping well. You have pain that gets worse or pain that is not helped with medicine. You have a fever. You are losing weight and you do not know why. You have night sweats. Get help  right away if: You cough up blood. You have trouble breathing. Your heartbeat is very fast. These symptoms may be an emergency. Do not wait to see if the symptoms will go away. Get medical help right away. Call your local emergency services (911 in the [place] Do not drive yourself to the hospital. Summary A cough helps to clear your throat and lungs. Many things can cause a cough. Take over-the-counter and prescription medicines only as told by your doctor. Always cover your mouth when you cough. Contact a doctor if you have new symptoms or you have  a cough that does not get better or gets worse. This information is not intended to replace advice given to you by your health care provider. Make sure you discuss any questions you have with your health care provider. Document Revised: 02/05/2020 Document Reviewed: 01/05/2019 Elsevier Patient Education  [place], MD [facility]

## 2022-12-12 NOTE — Assessment & Plan Note (Signed)
Running its course without complications. Clinically stable.  No red flag signs or symptoms. Continue over-the-counter medications, rest, and stay well-hydrated.

## 2022-12-12 NOTE — Patient Instructions (Signed)

## 2022-12-19 ENCOUNTER — Ambulatory Visit (HOSPITAL_COMMUNITY)
Admission: RE | Admit: 2022-12-19 | Discharge: 2022-12-19 | Disposition: A | Discharge status: Home / Self Care | Payer: Medicare Other | Source: Ambulatory Visit | Attending: Hematology and Oncology | Admitting: Hematology and Oncology

## 2022-12-19 ENCOUNTER — Inpatient Hospital Stay: Payer: Medicare Other | Attending: Gynecologic Oncology

## 2022-12-19 DIAGNOSIS — N289 Disorder of kidney and ureter, unspecified: Secondary | ICD-10-CM | POA: Diagnosis not present

## 2022-12-19 DIAGNOSIS — D563 Thalassemia minor: Secondary | ICD-10-CM | POA: Insufficient documentation

## 2022-12-19 DIAGNOSIS — Z7901 Long term (current) use of anticoagulants: Secondary | ICD-10-CM | POA: Insufficient documentation

## 2022-12-19 DIAGNOSIS — C541 Malignant neoplasm of endometrium: Secondary | ICD-10-CM | POA: Insufficient documentation

## 2022-12-19 DIAGNOSIS — Z7189 Other specified counseling: Secondary | ICD-10-CM

## 2022-12-19 DIAGNOSIS — Z79899 Other long term (current) drug therapy: Secondary | ICD-10-CM | POA: Insufficient documentation

## 2022-12-19 DIAGNOSIS — K7689 Other specified diseases of liver: Secondary | ICD-10-CM | POA: Insufficient documentation

## 2022-12-19 DIAGNOSIS — K769 Liver disease, unspecified: Secondary | ICD-10-CM | POA: Diagnosis not present

## 2022-12-19 LAB — CMP (CANCER CENTER ONLY)
ALT: 14 U/L (ref 0–44)
AST: 17 U/L (ref 15–41)
Albumin: 3.3 g/dL — ABNORMAL LOW (ref 3.5–5.0)
Alkaline Phosphatase: 97 U/L (ref 38–126)
Anion gap: 7 (ref 5–15)
BUN: 11 mg/dL (ref 8–23)
CO2: 27 mmol/L (ref 22–32)
Calcium: 8.9 mg/dL (ref 8.9–10.3)
Chloride: 106 mmol/L (ref 98–111)
Creatinine: 0.83 mg/dL (ref 0.44–1.00)
GFR, Estimated: >60 mL/min (ref >60)
Glucose, Bld: 100 mg/dL — ABNORMAL HIGH (ref 70–99)
Potassium: 3.9 mmol/L (ref 3.5–5.1)
Sodium: 140 mmol/L (ref 135–145)
Total Bilirubin: 0.8 mg/dL (ref 0.3–1.2)
Total Protein: 6.9 g/dL (ref 6.5–8.1)

## 2022-12-19 LAB — CBC WITH DIFFERENTIAL (CANCER CENTER ONLY)
Abs Immature Granulocytes: 0.01 10*3/uL (ref 0.00–0.07)
Basophils Absolute: 0 10*3/uL (ref 0.0–0.1)
Basophils Relative: 1 %
Eosinophils Absolute: 0.1 10*3/uL (ref 0.0–0.5)
Eosinophils Relative: 2 %
HCT: 36.3 % (ref 36.0–46.0)
Hemoglobin: 12.1 g/dL (ref 12.0–15.0)
Immature Granulocytes: 0 %
Lymphocytes Relative: 26 %
Lymphs Abs: 1 10*3/uL (ref 0.7–4.0)
MCH: 25.1 pg — ABNORMAL LOW (ref 26.0–34.0)
MCHC: 33.3 g/dL (ref 30.0–36.0)
MCV: 75.3 fL — ABNORMAL LOW (ref 80.0–100.0)
Monocytes Absolute: 0.3 10*3/uL (ref 0.1–1.0)
Monocytes Relative: 7 %
Neutro Abs: 2.5 10*3/uL (ref 1.7–7.7)
Neutrophils Relative %: 64 %
Platelet Count: 199 10*3/uL (ref 150–400)
RBC: 4.82 MIL/uL (ref 3.87–5.11)
RDW: 15.2 % (ref 11.5–15.5)
WBC Count: 3.8 10*3/uL — ABNORMAL LOW (ref 4.0–10.5)
nRBC: 0 % (ref 0.0–0.2)

## 2022-12-19 MED ORDER — HEPARIN SOD (PORK) LOCK FLUSH 100 UNIT/ML IV SOLN
500.0000 [IU] | Freq: Once | INTRAVENOUS | Status: AC
  Administered 2022-12-19: 500 [IU] via INTRAVENOUS

## 2022-12-19 MED ORDER — SODIUM CHLORIDE 0.9% FLUSH
10.0000 mL | Freq: Once | INTRAVENOUS | Status: AC
  Administered 2022-12-19: 10 mL

## 2022-12-19 MED ORDER — HEPARIN SOD (PORK) LOCK FLUSH 100 UNIT/ML IV SOLN
INTRAVENOUS | Status: AC
  Filled 2022-12-19: qty 5

## 2022-12-20 DIAGNOSIS — R3121 Asymptomatic microscopic hematuria: Secondary | ICD-10-CM | POA: Diagnosis not present

## 2022-12-20 DIAGNOSIS — R310 Gross hematuria: Secondary | ICD-10-CM | POA: Diagnosis not present

## 2022-12-20 DIAGNOSIS — N281 Cyst of kidney, acquired: Secondary | ICD-10-CM | POA: Diagnosis not present

## 2022-12-21 ENCOUNTER — Encounter: Payer: Self-pay | Admitting: Hematology and Oncology

## 2022-12-21 ENCOUNTER — Inpatient Hospital Stay (HOSPITAL_BASED_OUTPATIENT_CLINIC_OR_DEPARTMENT_OTHER): Payer: Medicare Other | Admitting: Hematology and Oncology

## 2022-12-21 ENCOUNTER — Inpatient Hospital Stay: Payer: Medicare Other

## 2022-12-21 VITALS — BP 154/73 | HR 5 | Temp 98.9°F | Resp 18 | Ht 64.0 in | Wt 216.2 lb

## 2022-12-21 DIAGNOSIS — C541 Malignant neoplasm of endometrium: Secondary | ICD-10-CM | POA: Diagnosis present

## 2022-12-21 DIAGNOSIS — Z7901 Long term (current) use of anticoagulants: Secondary | ICD-10-CM | POA: Diagnosis not present

## 2022-12-21 DIAGNOSIS — D563 Thalassemia minor: Secondary | ICD-10-CM | POA: Diagnosis not present

## 2022-12-21 DIAGNOSIS — Z79899 Other long term (current) drug therapy: Secondary | ICD-10-CM | POA: Diagnosis not present

## 2022-12-21 DIAGNOSIS — K7689 Other specified diseases of liver: Secondary | ICD-10-CM | POA: Diagnosis not present

## 2022-12-21 NOTE — Assessment & Plan Note (Signed)
She has multiple benign liver cyst Observe only

## 2022-12-21 NOTE — Progress Notes (Signed)
[place] OFFICE PROGRESS NOTE  Patient Care Team: [name], MD as PCP - General (Internal Medicine) Belva Crome, MD as PCP - Cardiology (Cardiology) Axel Filler, MD Monna Fam, MD as Consulting Physician (Ophthalmology)  ASSESSMENT & PLAN:  Endometrial cancer [facility]) She is completely asymptomatic We reviewed imaging study together She has no evidence of disease The patient has completed her treatment almost 2 years ago with negative imaging study since I recommend port removal I will space out her future appointment and I plan to see her again in 9 months with repeat blood work and imaging study She is in agreement  Beta thalassemia trait She has thalassemia trait She is not symptomatic Observe only  Benign liver cyst She has multiple benign liver cyst Observe only  Orders Placed This Encounter  Procedures   IR REMOVAL TUN ACCESS W/ PORT W/O FL MOD SED    Standing Status:   Future    Standing Expiration Date:   12/22/2023    Order Specific Question:   Reason for exam:    Answer:   no need port    Order Specific Question:   Preferred Imaging Location?    Answer:   [facility]   CT ABDOMEN PELVIS WO CONTRAST    Standing Status:   Future    Standing Expiration Date:   12/21/2023    Order Specific Question:   Preferred imaging location?    Answer:   [facility]    Order Specific Question:   Is Oral Contrast requested for this exam?    Answer:   Yes, Per Radiology protocol   CMP ([place] only)    Standing Status:   Future    Standing Expiration Date:   12/22/2023   CBC with Differential ([place] Only)    Standing Status:   Future    Standing Expiration Date:   12/22/2023    All questions were answered. The patient knows to call the clinic with any problems, questions or concerns. The total time spent in the appointment was 30 minutes encounter with patients including review of chart and various  tests results, discussions about plan of care and coordination of care plan   [name], MD 12/21/2022 8:04 AM  INTERVAL HISTORY: Please see below for problem oriented charting. she returns for surveillance follow-up for history of recurrent uterine cancer She is doing well She has no new symptoms We spent majority of our time reviewing test results and imaging studies and discussed next step  REVIEW OF SYSTEMS:   Constitutional: Denies fevers, chills or abnormal weight loss Eyes: Denies blurriness of vision Ears, nose, mouth, throat, and face: Denies mucositis or sore throat Respiratory: Denies cough, dyspnea or wheezes Cardiovascular: Denies palpitation, chest discomfort or lower extremity swelling Gastrointestinal:  Denies nausea, heartburn or change in bowel habits Skin: Denies abnormal skin rashes Lymphatics: Denies new lymphadenopathy or easy bruising Neurological:Denies numbness, tingling or new weaknesses Behavioral/Psych: Mood is stable, no new changes  All other systems were reviewed with the patient and are negative.  I have reviewed the past medical history, past surgical history, social history and family history with the patient and they are unchanged from previous note.  ALLERGIES:  is allergic to benicar hct [olmesartan medoxomil-hctz], iodinated contrast media, crestor [rosuvastatin], bactrim [sulfamethoxazole-trimethoprim], pravachol [pravastatin], shellfish allergy, and amlodipine.  MEDICATIONS:  Current Outpatient Medications  Medication Sig Dispense Refill   acetaminophen (TYLENOL) 500 MG tablet Take 1,000 mg by mouth  every 6 (six) hours as needed for moderate pain.     APPLE CIDER VINEGAR PO Take 5 mLs by mouth daily.      atenolol (TENORMIN) 25 MG tablet TAKE 1 TABLET (25 MG TOTAL) BY MOUTH DAILY. 90 tablet 1   Cholecalciferol (VITAMIN D3) 50 MCG (2000 UT) TABS Take by mouth.     ELIQUIS 5 MG TABS tablet TAKE 1 TABLET BY MOUTH TWICE A DAY 180 tablet 1    Evolocumab (REPATHA SURECLICK) 841 MG/ML SOAJ Inject 1 Pen into the skin every 14 (fourteen) days. 2 mL 11   levothyroxine (SYNTHROID) 75 MCG tablet TAKE 1 TABLET BY MOUTH EVERY DAY 90 tablet 0   Multiple Vitamin (MULTIVITAMIN) capsule Take 1 capsule by mouth daily.     pantoprazole (PROTONIX) 40 MG tablet Take 1 tablet (40 mg total) by mouth daily. 90 tablet 0   No current facility-administered medications for this visit.    SUMMARY OF ONCOLOGIC HISTORY: Oncology History Overview Note  Poorly differentiated carcinoma, mixed histology with squamous differentiation, rare focus of clear cells as well as serous features MSI stable Her2 negative   Endometrial cancer ([place])  09/01/2019 Initial Diagnosis   The patient reported a history of postmenopausal bleeding that began 1 to 2 months before diagnosis   09/14/2019 Imaging   US pelvis 1. Enlarged uterus with numerous myometrial masses, presumably fibroids. 2. Endometrial thickness of 6.2 mm. In the setting of post-menopausal bleeding, endometrial sampling is indicated to exclude carcinoma. If results are benign, sonohysterogram should be considered for focal lesion work-up.  3. Nonvisualized ovaries   09/23/2019 Pathology Results   A. ENDOMETRIUM, BIOPSY:  - Poorly differentiated carcinoma   10/15/2019 Imaging   Ct scan of chest, abdomen and pelvis: No evidence of metastatic disease within the chest, abdomen, or pelvis.   Enlarged fibroid uterus.   Colonic diverticulosis. No radiographic evidence of diverticulitis.   Aortic and coronary artery atherosclerosis.     10/22/2019 Pathology Results   SURGICAL PATHOLOGY   FINAL MICROSCOPIC DIAGNOSIS:   A. UTERUS, BILATERAL TUBES AND OVARIES, HYSTERECTOMY:  Poorly differentiated carcinoma, 6.5 cm.  Lymphovascular involvement by tumor.  Carcinoma involves inner half of the myometrium.  Margins not involved.  Cervix, bilateral fallopian tubes and bilateral ovaries free of tumor.   B.  LYMPH NODE, RIGHT EXTERNAL SENTINEL, BIOPSY:  One lymph node with no metastatic carcinoma (0/1).   C. LYMPH NODE, RIGHT PELVIC, BIOPSY  Five lymph nodes with no metastatic carcinoma (0/5).   D. LYMPH NODE, LEFT PELVIC, BIOPSY:  Five lymph nodes with no metastatic carcinoma (0/5).   E. LYMPH NODE, RIGHT PERI AORTIC, BIOPSY:  One lymph node with no metastatic carcinoma (0/1).   F. LYMPH NODE, LEFT PERI AORTIC, BIOPSY:  Five lymph nodes with no metastatic carcinoma (0/5).    ONCOLOGY TABLE:  UTERUS, CARCINOMA OR CARCINOSARCOMA   Procedure: Total hysterectomy with bilateral f-oophorectomy and sentinel  lymph nodes.  Histologic type: Poorly differentiated carcinoma, see comment.  Histologic Grade: High-grade, FIGO 3.  Myometrial invasion:       Depth of invasion: 13 mm       Myometrial thickness: 40 mm  Uterine Serosa Involvement: Not identified  Cervical stromal involvement: Not identified  Extent of involvement of other organs: Not identified  Lymphovascular invasion: Present  Regional Lymph Nodes:       Examined:     17 Sentinel  0 non-sentinel                               17 total        Lymph nodes with metastasis: 0        Isolated tumor cells (<0.2 mm): 0        Micrometastasis:  (>0.2 mm and < 2.0 mm): 0        Macrometastasis: (>2.0 mm): 0  Representative Tumor Block: A5, A6, A7 and A8.  MMR / MSI testing: Pending  Pathologic Stage Classification (pTNM, AJCC 8th edition):  pT1a, pN0  Comments: The carcinoma is a high-grade poorly differentiated carcinoma which morphologically has predominantly serous features.  There are a few foci with squamous differentiation and a rare focus of clear cell features.  Immunohistochemistry for cytokeratin AE1/AE3 is performed on the sentinel lymph nodes and no positivity is identified.    10/22/2019 Surgery   Pre-operative Diagnosis: endometrial cancer grade 3   Post-operative Diagnosis: same,     Operation: Robotic-assisted laparoscopic total hysterectomy for uterus >250gm with bilateral salpingoophorectomy, SLN mapping, bilateral pelvic and para-aortic lymphadenectomy.   Surgeon: Donaciano Eva    Operative Findings:  : 16cm bulky fibroid uterus, normal ovaries bilaterally, no suspicious lymph nodes.     11/10/2019 Cancer Staging   Staging form: Corpus Uteri - Carcinoma and Carcinosarcoma, AJCC 8th Edition - Pathologic: Stage IVB (pT1a, pN0, cM1) - Signed by [name], MD on 05/16/2020   05/10/2020 Imaging   1. New hypodense 2.0 cm segment 4A left liver lobe mass, suspicious for hepatic metastasis. 2. New left pelvic sidewall 1.4 cm soft tissue nodule, suspicious for left internal iliac nodal metastasis. 3. New left vaginal cuff 1.6 x 1.3 cm soft tissue nodule, suspicious for recurrent tumor. 4. Aortic Atherosclerosis (ICD10-I70.0).   05/18/2020 Procedure   Successful placement of a right internal jugular approach power injectable Port-A-Cath. The catheter is ready for immediate use.   05/23/2020 -  Chemotherapy   The patient had carboplatin and taxol for chemotherapy treatment.     07/28/2020 Imaging   1. No findings identified to suggest recurrent or metastatic disease. 2. Indeterminate and slightly exophytic lesion arising from the inferior pole of left kidney measures 0.8 cm. Technically this is too small to reliably characterize. Attention on follow-up imaging is advised. 3. Aortic atherosclerosis and LAD coronary artery atherosclerotic calcifications.   11/07/2020 Imaging   1. Interval progression of right pelvic sidewall lymphadenopathy, highly concerning for disease progression. PET-CT may prove helpful to further evaluate as clinically warranted. 2. Interval resolution of the previously identified hypodense lesion in segment IVA of the liver. 3. Stable 7 mm subcapsular lesion in the lower pole left kidney with attenuation higher than would be expected for a  simple cyst but is too small to reliably characterize. Attention on follow-up recommended. 4. Aortic Atherosclerosis (ICD10-I70.0).   11/16/2020 - 01/04/2021 Chemotherapy   She received Beryle Flock plus Lenvima       02/02/2021 Imaging   1. Significant interval decrease in size in right pelvic sidewall, iliac, and inguinal lymph nodes, consistent with treatment response of nodal metastatic disease. 2. Multiple low-attenuation lesions throughout the liver, unchanged compared to prior examination, the majority of these consistent with simple cysts. A previously noted metastatic lesion of anterior hepatic segment VII remains resolved. No new lesions. Findings are consistent with sustained treatment response of hepatic metastatic disease. 3. Status post hysterectomy. Unchanged post treatment  appearance of left vaginal cuff and pelvic sidewall soft tissue, consistent with sustained treatment response. 4. No evidence of metastatic disease in the chest.   Aortic Atherosclerosis (ICD10-I70.0).     05/17/2021 Imaging   Mild right external iliac lymphadenopathy, without significant change. No new or progressive disease within the abdomen or pelvis.   Colonic diverticulosis. No radiographic evidence of diverticulitis.   Aortic Atherosclerosis (ICD10-I70.0).   11/03/2021 Imaging   Stable mild right external iliac lymphadenopathy. No new or progressive disease within the abdomen or pelvis.   Colonic diverticulosis. No radiographic evidence of diverticulitis.   Aortic Atherosclerosis (ICD10-I70.0).     02/05/2022 Imaging   1. Progressive enlargement of an external iliac lymph node on the right, suspicious for metastatic disease from the patient's endometrial cancer. This should be amenable to percutaneous biopsy. 2. No other evidence of progressive metastatic disease. Heterogeneous sclerosis of the proximal right femur is unchanged from recent prior studies, presumably treated metastatic disease. 3.  Grossly stable probable cysts within the liver and kidneys. 4. Distal colonic diverticulosis. 5.  Aortic Atherosclerosis (ICD10-I70.0).     06/21/2022 Imaging   1. Interval decrease in size of the right external iliac nodal disease suggesting a good response to treatment. 2. No new or progressive findings. 3. Stable scattered low-attenuation hepatic lesions. No findings suspicious for recurrent hepatic metastatic disease. 4. Status post cholecystectomy. No biliary dilatation. 5. Moderate stool burden suggesting constipation.   Aortic Atherosclerosis (ICD10-I70.0).   12/21/2022 Imaging   1. Interval decreased size of right external iliac lymph node. 2. Stable lytic and sclerotic lesion of the greater trochanter of the right femur. 3. No evidence of new or progressive metastatic disease in the abdomen or pelvis. 4. Aortic Atherosclerosis (ICD10-I70.0).         PHYSICAL EXAMINATION: ECOG PERFORMANCE STATUS: 0 - Asymptomatic  Vitals:   12/21/22 0759  BP: (!) 154/73  Pulse: (!) 5  Resp: 18  Temp: 98.9 F (37.2 C)  SpO2: 100%   Filed Weights   12/21/22 0759  Weight: 216 lb 3.2 oz (98.1 kg)    GENERAL:alert, no distress and comfortable NEURO: alert & oriented x 3 with fluent speech, no focal motor/sensory deficits  LABORATORY DATA:  I have reviewed the data as listed    Component Value Date/Time   NA 140 12/19/2022 0756   NA 140 07/04/2017 0952   K 3.9 12/19/2022 0756   CL 106 12/19/2022 0756   CO2 27 12/19/2022 0756   GLUCOSE 100 (H) 12/19/2022 0756   BUN 11 12/19/2022 0756   BUN 10 07/04/2017 0952   CREATININE 0.83 12/19/2022 0756   CREATININE 0.81 03/23/2015 0946   CALCIUM 8.9 12/19/2022 0756   PROT 6.9 12/19/2022 0756   ALBUMIN 3.3 (L) 12/19/2022 0756   AST 17 12/19/2022 0756   ALT 14 12/19/2022 0756   ALKPHOS 97 12/19/2022 0756   BILITOT 0.8 12/19/2022 0756   GFRNONAA >60 12/19/2022 0756   GFRNONAA 78 03/23/2015 0946   GFRAA >60 09/12/2020 0853   GFRAA  >89 03/23/2015 0946    No results found for: "SPEP", "UPEP"  Lab Results  Component Value Date   WBC 3.8 (L) 12/19/2022   NEUTROABS 2.5 12/19/2022   HGB 12.1 12/19/2022   HCT 36.3 12/19/2022   MCV 75.3 (L) 12/19/2022   PLT 199 12/19/2022      Chemistry      Component Value Date/Time   NA 140 12/19/2022 0756   NA 140 07/04/2017 0952   K  3.9 12/19/2022 0756   CL 106 12/19/2022 0756   CO2 27 12/19/2022 0756   BUN 11 12/19/2022 0756   BUN 10 07/04/2017 0952   CREATININE 0.83 12/19/2022 0756   CREATININE 0.81 03/23/2015 0946      Component Value Date/Time   CALCIUM 8.9 12/19/2022 0756   ALKPHOS 97 12/19/2022 0756   AST 17 12/19/2022 0756   ALT 14 12/19/2022 0756   BILITOT 0.8 12/19/2022 0756       RADIOGRAPHIC STUDIES: I have reviewed multiple imaging studies with the patient I have personally reviewed the radiological images as listed and agreed with the findings in the report. CT Abdomen Pelvis Wo Contrast  Result Date: 12/20/2022 CLINICAL DATA:  Cervical cancer, assess treatment response; * Tracking Code: BO * EXAM: CT ABDOMEN AND PELVIS WITHOUT CONTRAST TECHNIQUE: Multidetector CT imaging of the abdomen and pelvis was performed following the standard protocol without IV contrast. RADIATION DOSE REDUCTION: This exam was performed according to the departmental dose-optimization program which includes automated exposure control, adjustment of the mA and/or kV according to patient size and/or use of iterative reconstruction technique. COMPARISON:  Multiple priors, most recent CT abdomen and pelvis dated July 20, 2022 FINDINGS: Lower chest: No acute abnormality. Hepatobiliary: Stable scattered low-attenuation liver lesions which are likely simple cysts. Within limitations of noncontrast exam, no new liver lesions are seen. Gallbladder is surgically absent. No biliary ductal dilation. Pancreas: Unremarkable. No pancreatic ductal dilatation or surrounding inflammatory changes.  Spleen: Normal in size without focal abnormality. Adrenals/Urinary Tract: Bilateral adrenal glands are unremarkable. No hydronephrosis or nephrolithiasis. Grossly stable small low-density lesion in the interpolar region of the right kidney and hyper attenuated lesion anteriorly in the lower interpolar region of the left kidney, likely cysts based on stability, specific follow-up imaging is recommended for these lesions. Bladder is unremarkable. Stomach/Bowel: Stomach is within normal limits. Appendix appears normal. No evidence of bowel wall thickening, distention, or inflammatory changes. Vascular/Lymphatic: Aortic atherosclerosis. Decreased size and increased calcification of right external iliac lymph node measuring 10 x 8 mm on series 2, image 63, previously 15 x 13 mm. No enlarged abdominal or pelvic lymph nodes. Reproductive: Status post hysterectomy. No adnexal masses. Other: No abdominal wall hernia or abnormality. No abdominopelvic ascites. Musculoskeletal: Stable lytic and sclerotic lesion of the greater trochanter of the right femur, unchanged compared with multiple priors. IMPRESSION: 1. Interval decreased size of right external iliac lymph node. 2. Stable lytic and sclerotic lesion of the greater trochanter of the right femur. 3. No evidence of new or progressive metastatic disease in the abdomen or pelvis. 4. Aortic Atherosclerosis (ICD10-I70.0). Electronically Signed   By: Yetta Glassman M.D.   On: 12/20/2022 16:25

## 2022-12-21 NOTE — Assessment & Plan Note (Signed)
She has thalassemia trait She is not symptomatic Observe only

## 2022-12-21 NOTE — Assessment & Plan Note (Signed)
She is completely asymptomatic We reviewed imaging study together She has no evidence of disease The patient has completed her treatment almost 2 years ago with negative imaging study since I recommend port removal I will space out her future appointment and I plan to see her again in 9 months with repeat blood work and imaging study She is in agreement

## 2022-12-27 ENCOUNTER — Other Ambulatory Visit: Payer: Self-pay | Admitting: Internal Medicine

## 2022-12-27 DIAGNOSIS — Z1231 Encounter for screening mammogram for malignant neoplasm of breast: Secondary | ICD-10-CM

## 2023-01-02 ENCOUNTER — Ambulatory Visit (HOSPITAL_COMMUNITY)
Admission: RE | Admit: 2023-01-02 | Discharge: 2023-01-02 | Disposition: A | Discharge status: Home / Self Care | Payer: Medicare Other | Source: Ambulatory Visit | Attending: Hematology and Oncology | Admitting: Hematology and Oncology

## 2023-01-02 DIAGNOSIS — Z452 Encounter for adjustment and management of vascular access device: Secondary | ICD-10-CM | POA: Diagnosis not present

## 2023-01-02 DIAGNOSIS — C541 Malignant neoplasm of endometrium: Secondary | ICD-10-CM

## 2023-01-02 HISTORY — PX: IR REMOVAL TUN ACCESS W/ PORT W/O FL MOD SED: IMG2290

## 2023-01-02 MED ORDER — LIDOCAINE HCL 1 % IJ SOLN
INTRAMUSCULAR | Status: AC
  Filled 2023-01-02: qty 20

## 2023-01-04 ENCOUNTER — Telehealth: Payer: Self-pay

## 2023-01-04 NOTE — Telephone Encounter (Signed)
Returned her call to answering service this am. Left a message asking her to call the office back.

## 2023-01-04 NOTE — Telephone Encounter (Signed)
Returned her call. Reassured her that Dr. Alvy Bimler did receive the christmas gift.

## 2023-01-09 ENCOUNTER — Encounter: Payer: Self-pay | Admitting: Internal Medicine

## 2023-01-09 ENCOUNTER — Ambulatory Visit (INDEPENDENT_AMBULATORY_CARE_PROVIDER_SITE_OTHER): Payer: Medicare Other | Admitting: Internal Medicine

## 2023-01-09 VITALS — BP 144/78 | HR 70 | Temp 98.2°F | Resp 16 | Ht 64.0 in | Wt 216.0 lb

## 2023-01-09 DIAGNOSIS — Z0000 Encounter for general adult medical examination without abnormal findings: Secondary | ICD-10-CM

## 2023-01-09 DIAGNOSIS — E039 Hypothyroidism, unspecified: Secondary | ICD-10-CM | POA: Diagnosis not present

## 2023-01-09 DIAGNOSIS — Z0001 Encounter for general adult medical examination with abnormal findings: Secondary | ICD-10-CM

## 2023-01-09 DIAGNOSIS — I10 Essential (primary) hypertension: Secondary | ICD-10-CM

## 2023-01-09 DIAGNOSIS — K635 Polyp of colon: Secondary | ICD-10-CM | POA: Insufficient documentation

## 2023-01-09 LAB — TSH: TSH: 1.66 u[IU]/mL (ref 0.35–5.50)

## 2023-01-09 NOTE — Patient Instructions (Signed)

## 2023-01-09 NOTE — Progress Notes (Unsigned)
Subjective:  Patient ID: Tina Stone, female    DOB: 09/04/1950  Age: 73 y.o. MRN: [id number]  CC: Annual Exam, Hypertension, and Hypothyroidism   HPI Tina Stone presents for a CPX and f/up -  Tina Stone complains of feeling irritable and jittery. Tina Stone exercises for 1.5 hours qod and has good endurance. Tina Stone denies palpitations, CP, DOE, edema, or fatigue  Outpatient Medications Prior to Visit  Medication Sig Dispense Refill   acetaminophen (TYLENOL) 500 MG tablet Take 1,000 mg by mouth every 6 (six) hours as needed for moderate pain.     APPLE CIDER VINEGAR PO Take 5 mLs by mouth daily.      atenolol (TENORMIN) 25 MG tablet TAKE 1 TABLET (25 MG TOTAL) BY MOUTH DAILY. 90 tablet 1   Cholecalciferol (VITAMIN D3) 50 MCG (2000 UT) TABS Take by mouth.     ELIQUIS 5 MG TABS tablet TAKE 1 TABLET BY MOUTH TWICE A DAY 180 tablet 1   Evolocumab (REPATHA SURECLICK) 932 MG/ML SOAJ Inject 1 Pen into the skin every 14 (fourteen) days. 2 mL 11   levothyroxine (SYNTHROID) 75 MCG tablet TAKE 1 TABLET BY MOUTH EVERY DAY 90 tablet 0   Multiple Vitamin (MULTIVITAMIN) capsule Take 1 capsule by mouth daily.     pantoprazole (PROTONIX) 40 MG tablet Take 1 tablet (40 mg total) by mouth daily. 90 tablet 0   No facility-administered medications prior to visit.    ROS Review of Systems  Constitutional:  Negative for diaphoresis, fatigue and unexpected weight change.  HENT: Negative.    Eyes: Negative.   Respiratory:  Negative for cough, chest tightness, shortness of breath and wheezing.   Cardiovascular:  Negative for chest pain, palpitations and leg swelling.  Gastrointestinal:  Negative for abdominal pain, constipation, diarrhea, nausea and vomiting.  Endocrine: Negative.  Negative for cold intolerance and heat intolerance.  Genitourinary: Negative.  Negative for difficulty urinating.  Musculoskeletal: Negative.  Negative for arthralgias, joint swelling and myalgias.  Skin: Negative.    Neurological:  Negative for dizziness and weakness.  Hematological:  Negative for adenopathy. Does not bruise/bleed easily.  Psychiatric/Behavioral: Negative.      Objective:  BP (!) 144/78 (BP Location: Left Arm, Patient Position: Sitting, Cuff Size: Large)   Pulse 70   Temp 98.2 F (36.8 C) (Oral)   Resp 16   Ht '5\' 4"'$  (1.626 m)   Wt 216 lb (98 kg)   SpO2 99%   BMI 37.08 kg/m   BP Readings from Last 3 Encounters:  01/09/23 (!) 144/78  12/21/22 (!) 154/73  12/12/22 118/70    Wt Readings from Last 3 Encounters:  01/09/23 216 lb (98 kg)  12/21/22 216 lb 3.2 oz (98.1 kg)  12/12/22 215 lb (97.5 kg)    Physical Exam Vitals reviewed.  HENT:     Nose: Nose normal.     Mouth/Throat:     Mouth: Mucous membranes are moist.  Eyes:     General: No scleral icterus.    Conjunctiva/sclera: Conjunctivae normal.  Cardiovascular:     Rate and Rhythm: Normal rate and regular rhythm.     Heart sounds: No murmur heard. Pulmonary:     Effort: Pulmonary effort is normal.     Breath sounds: No stridor. No wheezing, rhonchi or rales.  Abdominal:     General: Abdomen is flat.     Palpations: There is no mass.     Tenderness: There is no abdominal tenderness. There is no guarding.  Hernia: No hernia is present.  Musculoskeletal:        General: Normal range of motion.     Cervical back: Neck supple.     Right lower leg: No edema.     Left lower leg: No edema.  Lymphadenopathy:     Cervical: No cervical adenopathy.  Skin:    General: Skin is warm and dry.  Neurological:     General: No focal deficit present.     Mental Status: Tina Stone is alert.  Psychiatric:        Mood and Affect: Mood normal.        Behavior: Behavior normal.     Lab Results  Component Value Date   WBC 3.8 (L) 12/19/2022   HGB 12.1 12/19/2022   HCT 36.3 12/19/2022   PLT 199 12/19/2022   GLUCOSE 100 (H) 12/19/2022   CHOL 219 (H) 10/17/2022   TRIG 55 10/17/2022   HDL 84 10/17/2022   LDLCALC 125 (H)  10/17/2022   ALT 14 12/19/2022   AST 17 12/19/2022   NA 140 12/19/2022   K 3.9 12/19/2022   CL 106 12/19/2022   CREATININE 0.83 12/19/2022   BUN 11 12/19/2022   CO2 27 12/19/2022   TSH 0.36 08/24/2022   INR 1.0 01/28/2021   HGBA1C 5.3 02/02/2022    IR REMOVAL TUN ACCESS W/ PORT W/O FL MOD SED  Result Date: 01/02/2023 CLINICAL DATA:  History of endometrial carcinoma and status post Port-A-Cath placement on 05/18/2020 for chemotherapy. The patient has completed chemotherapy and requires removal her port. EXAM: REMOVAL OF IMPLANTED TUNNELED PORT-A-CATH MEDICATIONS: None PROCEDURE: The right chest Port-A-Cath site was prepped with chlorhexidine. A sterile gown and gloves were worn during the procedure. Local anesthesia was provided with 1% lidocaine. An incision was made overlying the Port-A-Cath with a #15 scalpel. Utilizing sharp and blunt dissection, the Port-A-Cath was removed. Portable cautery was utilized. The pocket was irrigated with sterile saline. Wound closure was performed with subcutaneous 3-0 Monocryl, subcuticular 4-0 Vicryl and Dermabond. The entire Port-A-Cath was removed successfully. IMPRESSION: Removal of implanted Port-A-Cath utilizing sharp and blunt dissection. The procedure was uncomplicated. Electronically Signed   By: Aletta Edouard M.D.   On: 01/02/2023 09:27    Assessment & Plan:   Tina Stone was seen today for annual exam, hypertension and hypothyroidism.  Diagnoses and all orders for this visit:  Essential hypertension -     TSH; Future -     TSH  Acquired hypothyroidism -     TSH; Future -     TSH  Encounter for general adult medical examination with abnormal findings  Polyp of colon, unspecified part of colon, unspecified type -     Ambulatory referral to Gastroenterology   I am having Tina Stone maintain her APPLE CIDER VINEGAR PO, multivitamin, acetaminophen, Vitamin D3, atenolol, Eliquis, Repatha SureClick, levothyroxine, and  pantoprazole.  No orders of the defined types were placed in this encounter.    Follow-up: Return in about 6 months (around 07/10/2023).  Scarlette Calico, MD

## 2023-01-15 ENCOUNTER — Other Ambulatory Visit: Payer: Self-pay | Admitting: Internal Medicine

## 2023-01-15 DIAGNOSIS — E039 Hypothyroidism, unspecified: Secondary | ICD-10-CM

## 2023-01-15 DIAGNOSIS — I10 Essential (primary) hypertension: Secondary | ICD-10-CM

## 2023-01-15 DIAGNOSIS — I480 Paroxysmal atrial fibrillation: Secondary | ICD-10-CM

## 2023-01-25 ENCOUNTER — Other Ambulatory Visit (HOSPITAL_COMMUNITY): Payer: Self-pay

## 2023-01-25 DIAGNOSIS — R3121 Asymptomatic microscopic hematuria: Secondary | ICD-10-CM | POA: Diagnosis not present

## 2023-01-30 ENCOUNTER — Other Ambulatory Visit (HOSPITAL_COMMUNITY): Payer: Self-pay

## 2023-02-01 ENCOUNTER — Ambulatory Visit (INDEPENDENT_AMBULATORY_CARE_PROVIDER_SITE_OTHER): Payer: 59 | Admitting: Nurse Practitioner

## 2023-02-01 VITALS — BP 136/78 | HR 65 | Temp 98.1°F | Ht 64.0 in | Wt 216.5 lb

## 2023-02-01 DIAGNOSIS — R0981 Nasal congestion: Secondary | ICD-10-CM | POA: Insufficient documentation

## 2023-02-01 LAB — POCT RESPIRATORY SYNCYTIAL VIRUS: RSV Rapid Ag: NEGATIVE

## 2023-02-01 LAB — POC COVID19 BINAXNOW: SARS Coronavirus 2 Ag: NEGATIVE

## 2023-02-01 LAB — POCT INFLUENZA A/B
Influenza A, POC: NEGATIVE
Influenza B, POC: NEGATIVE

## 2023-02-01 MED ORDER — FLUTICASONE PROPIONATE 50 MCG/ACT NA SUSP: 2.0000 | Freq: Every day | NASAL | 6 refills | Status: DC

## 2023-02-01 NOTE — Progress Notes (Signed)
   Established Patient Office Visit  Subjective   Patient ID: Tina Stone, female    DOB: [birth year]  Age: 73 y.o. MRN: [id number]  Chief Complaint  Patient presents with   Acute Visit    Congestion, no cough or sore throat, x 2 days     Symptom onset 2 days ago. May have been exposed to someone with covid. Main symptoms is congestion. Has felt chills, but has not checked temperature to verify fever. No fatigue, no cough, no shortness of breath. Has chronic sinus problems at baseline.     Review of Systems  Constitutional:  Negative for fever.  HENT:  Positive for congestion.   Respiratory:  Negative for cough and shortness of breath.   Cardiovascular:  Negative for chest pain.      Objective:     BP 136/78   Pulse 65   Temp 98.1 F (36.7 C) (Oral)   Ht '5\' 4"'$  (1.626 m)   Wt 216 lb 8 oz (98.2 kg)   SpO2 99%   BMI 37.16 kg/m    Physical Exam Vitals reviewed.  Constitutional:      General: She is not in acute distress.    Appearance: Normal appearance.  HENT:     Head: Normocephalic and atraumatic.  Neck:     Vascular: No carotid bruit.  Cardiovascular:     Rate and Rhythm: Normal rate and regular rhythm.     Pulses: Normal pulses.     Heart sounds: Normal heart sounds.  Pulmonary:     Effort: Pulmonary effort is normal.     Breath sounds: Normal breath sounds.  Skin:    General: Skin is warm and dry.  Neurological:     General: No focal deficit present.     Mental Status: She is alert and oriented to person, place, and time.  Psychiatric:        Mood and Affect: Mood normal.        Behavior: Behavior normal.        Judgment: Judgment normal.      No results found for any visits on 02/01/23.    The 10-year ASCVD risk score (Arnett DK, et al., 2019) is: 15%    Assessment & Plan:   Problem List Items Addressed This Visit       Respiratory   Congestion of nasal sinus - Primary    Acute, VSS, patient appears well on exam no signs of acute  distress. POC covid/RSV/flu testing negative. Recommend symptomatic treatment with flonase nasal spray, rest, hydration, tylenol/ibuprofen as needed per package directions for fever or myalgias. F/U if symptoms persist for >7 days or if they worsen before that.       Relevant Medications   fluticasone (FLONASE) 50 MCG/ACT nasal spray   Other Relevant Orders   POCT respiratory syncytial virus   POCT Influenza A/B   POC COVID-19    Return if symptoms worsen or fail to improve.    Ailene Ards, NP

## 2023-02-01 NOTE — Assessment & Plan Note (Signed)
Acute, VSS, patient appears well on exam no signs of acute distress. POC covid/RSV/flu testing negative. Recommend symptomatic treatment with flonase nasal spray, rest, hydration, tylenol/ibuprofen as needed per package directions for fever or myalgias. F/U if symptoms persist for >7 days or if they worsen before that.

## 2023-02-04 DIAGNOSIS — H40013 Open angle with borderline findings, low risk, bilateral: Secondary | ICD-10-CM | POA: Diagnosis not present

## 2023-02-18 ENCOUNTER — Other Ambulatory Visit: Payer: Self-pay | Admitting: Internal Medicine

## 2023-02-18 DIAGNOSIS — D6869 Other thrombophilia: Secondary | ICD-10-CM

## 2023-02-18 DIAGNOSIS — I480 Paroxysmal atrial fibrillation: Secondary | ICD-10-CM

## 2023-02-18 NOTE — Telephone Encounter (Signed)
Patient called and said that she only has 2 pills left - this will need to be approved so she can get it before she runs out.  Patient's # [phone]

## 2023-02-20 ENCOUNTER — Encounter: Payer: Self-pay | Admitting: Hematology and Oncology

## 2023-02-21 ENCOUNTER — Telehealth: Payer: Self-pay

## 2023-02-21 NOTE — Telephone Encounter (Signed)
Returned call to patient - no answer. Kathrene Alu RN

## 2023-02-21 NOTE — Telephone Encounter (Signed)
-----   Message from Maurine Minister, [place] sent at 02/21/2023  2:40 PM EST ----- Regarding: Request call back Requesting to speak with Dr. Eugenie Norrie nurse.

## 2023-03-11 DIAGNOSIS — E785 Hyperlipidemia, unspecified: Secondary | ICD-10-CM | POA: Diagnosis not present

## 2023-03-13 LAB — NMR, LIPOPROFILE
Cholesterol, Total: 200 mg/dL — ABNORMAL HIGH (ref 100–199)
HDL Particle Number: 42.8 umol/L (ref >=30.5)
HDL-C: 82 mg/dL (ref >39)
LDL Particle Number: 1118 nmol/L — ABNORMAL HIGH (ref <1000)
LDL Size: 21.6 nm (ref >20.5)
LDL-C (NIH Calc): 106 mg/dL — ABNORMAL HIGH (ref 0–99)
LP-IR Score: <25 (ref <=45)
Small LDL Particle Number: 198 nmol/L (ref <=527)
Triglycerides: 65 mg/dL (ref 0–149)

## 2023-03-13 LAB — LIPOPROTEIN A (LPA): Lipoprotein (a): 167.1 nmol/L — ABNORMAL HIGH (ref <75.0)

## 2023-03-18 ENCOUNTER — Ambulatory Visit: Payer: 59 | Attending: Internal Medicine | Admitting: Internal Medicine

## 2023-03-18 ENCOUNTER — Encounter: Payer: Self-pay | Admitting: Internal Medicine

## 2023-03-18 VITALS — BP 144/76 | HR 66 | Ht 64.0 in | Wt 217.4 lb

## 2023-03-18 DIAGNOSIS — T466X5D Adverse effect of antihyperlipidemic and antiarteriosclerotic drugs, subsequent encounter: Secondary | ICD-10-CM | POA: Diagnosis not present

## 2023-03-18 DIAGNOSIS — E785 Hyperlipidemia, unspecified: Secondary | ICD-10-CM

## 2023-03-18 DIAGNOSIS — I700 Atherosclerosis of aorta: Secondary | ICD-10-CM

## 2023-03-18 DIAGNOSIS — M791 Myalgia, unspecified site: Secondary | ICD-10-CM | POA: Diagnosis not present

## 2023-03-18 NOTE — Progress Notes (Signed)
LIPID CLINIC CONSULT NOTE  Chief Complaint:  Follow-up dyslipidemia  Primary Care Physician: [name], MD  Primary Cardiologist:  Sinclair Grooms, MD (Inactive)  HPI:  Tina Stone is a 73 y.o. female who is being seen today for the evaluation of dyslipidemia at the request of [name], MD. this is a pleasant 73 year old female kindly referred for evaluation management of dyslipidemia.  She has a history of atrial fibrillation, hypertension and dyslipidemia with aortic atherosclerosis.  Unfortunately she has been intolerant to statins.  Most recently she was tried on rosuvastatin but has previously had muscle aches on atorvastatin and pravastatin.  She then was seen by our lipid clinic pharmacist who started her on [place] back in August.  She believes she has had at least 3 injections prior to repeat lab work in October which showed total cholesterol 219, HDL 55, triglycerides 84 and LDL 125.  The LDL had come down from 167, but this would be considered a suboptimal response, much less than the expected 50 to 65%.  There may be several reasons for this.  A change in diet or decrease in activity that was concomitant with starting the medicine might explain it.  Ms. administration of the medication could be an issue but it does not sound like she is having issues with that.  The other possibilities are mutations a PCSK9 or perhaps high LP(a) which seems to be a more common issue.  She also asked about Ozempic today and was interested in a prescription for it.  03/18/2023  [name] returns today for follow-up of her lipids.  She has had further improvement in her cholesterol on Repatha.  Her LDL particle number is now 1118 with an LDL-C of 106, triglycerides 82 and HDL of 65.  Her LP(a) was assessed and is elevated 167 nmol/L.  She seems to be tolerating the [place] quite well.  No side effects as noted with her statins in the past.  Her primary cardiologist Dr. Tamala Julian is  retired.  I am happy to follow-up with her regarding her lipids and A-fib is a general cardiologist.   PMHx:  Past Medical History:  Diagnosis Date   Adenomatous colon polyp    Allergic rhinitis, seasonal    Allergy    Atrial fibrillation ([place])    Beta thalassemia trait 11/25/2013   Cholelithiasis    Class 3 obesity without serious comorbidity with body mass index (BMI) of 40.0 to 44.9 in adult 11/19/2012   endometrial ca dx'd 08/2009   endometrial    GERD (gastroesophageal reflux disease)    H. pylori infection    History of radiation therapy    endometrial - 02/19/2022-03/23/2022 Dr Tina Stone   HLD (hyperlipidemia)    Hypercholesterolemia    Hypertension 03/18/2017   no meds    Hypothyroidism    Intraductal papilloma of left breast    Patient underwent left needle-localized lumpectomy by Dr. Imogene Burn. Tina Stone on 09/09/2013; pathology showed intraductal papilloma with no atypia or malignancy identified.   PONV (postoperative nausea and vomiting)    Pre-diabetes    pt denies   Urticaria    Uterine fibroid     Past Surgical History:  Procedure Laterality Date   ANKLE FRACTURE SURGERY Right 1966   right   BREAST EXCISIONAL BIOPSY Left 2014   benign   BREAST LUMPECTOMY WITH NEEDLE LOCALIZATION Left 09/09/2013   Procedure: BREAST LUMPECTOMY WITH NEEDLE LOCALIZATION;  Surgeon: Imogene Burn. Georgette Dover, MD;  Location:  [facility];  Service: General;  Laterality: Left;   CHOLECYSTECTOMY     COLONOSCOPY     IR IMAGING GUIDED PORT INSERTION  05/18/2020   IR REMOVAL TUN ACCESS W/ PORT W/O FL MOD SED  01/02/2023   POLYPECTOMY     ROBOTIC ASSISTED LAPAROSCOPIC CHOLECYSTECTOMY  09/09/2019   ROBOTIC ASSISTED TOTAL HYSTERECTOMY WITH BILATERAL SALPINGO OOPHERECTOMY N/A 10/22/2019   Procedure: XI ROBOTIC ASSISTED TOTAL HYSTERECTOMY WITH BILATERAL SALPINGO OOPHORECTOMY GREATER THAN 250 GRAMS, MINI LAPARTOMY FOR SPECIMEN DELIVERY; PELVIC AND PERI-AORTIC LYMPHADENECTOMY;  Surgeon: Everitt Amber, MD;   Location: [facility];  Service: Gynecology;  Laterality: N/A;   SENTINEL NODE BIOPSY N/A 10/22/2019   Procedure: SENTINEL NODE BIOPSY;  Surgeon: Everitt Amber, MD;  Location: [facility];  Service: Gynecology;  Laterality: N/A;    FAMHx:  Family History  Problem Relation Age of Onset   Diabetes Mother    Hypertension Mother    Colon polyps Mother 86   Dementia Mother 60   Diabetes Father    Congestive Heart Failure Father    Asthma Brother    Diabetes Brother    Hypertension Brother    Hypertension Brother    Hypertension Maternal Grandmother    Diabetes Maternal Grandmother    Asthma Paternal Grandmother    Lung disease Paternal Grandmother    Diabetes Paternal Grandfather    Pancreatic cancer Paternal Aunt    Uterine cancer Paternal Aunt    Colon cancer Neg Hx    Breast cancer Neg Hx    Lung cancer Neg Hx    Esophageal cancer Neg Hx    Rectal cancer Neg Hx    Stomach cancer Neg Hx     SOCHx:   reports that she has never smoked. She has never used smokeless tobacco. She reports that she does not drink alcohol and does not use drugs.  ALLERGIES:  Allergies  Allergen Reactions   Benicar Hct [Olmesartan Medoxomil-Hctz] Shortness Of Breath and Palpitations   Iodinated Contrast Media Hives and Itching    02/02/2021-  pt developed 2 hives and itching even with the 13hr premedication.  Radiology PA came into eval the pt.  Developed itching and hives after injection on 05/10/20; needs 13hr prep in future   Crestor [Rosuvastatin] Other (See Comments)    Muscle aches   Bactrim [Sulfamethoxazole-Trimethoprim] Other (See Comments)    Abdominal pain, dizziness   Pravachol [Pravastatin] Other (See Comments)    Lower abdominal pain   Shellfish Allergy Nausea And Vomiting   Amlodipine Palpitations    ROS: Pertinent items noted in HPI and remainder of comprehensive ROS otherwise negative.  HOME MEDS: Current Outpatient Medications on File Prior to Visit  Medication Sig Dispense Refill    acetaminophen (TYLENOL) 500 MG tablet Take 1,000 mg by mouth every 6 (six) hours as needed for moderate pain.     APPLE CIDER VINEGAR PO Take 5 mLs by mouth daily.      atenolol (TENORMIN) 25 MG tablet TAKE 1 TABLET (25 MG TOTAL) BY MOUTH DAILY. 90 tablet 1   Cholecalciferol (VITAMIN D3) 50 MCG (2000 UT) TABS Take by mouth.     ELIQUIS 5 MG TABS tablet TAKE 1 TABLET BY MOUTH TWICE A DAY 180 tablet 0   Evolocumab (REPATHA SURECLICK) XX123456 MG/ML SOAJ Inject 1 Pen into the skin every 14 (fourteen) days. 2 mL 11   fluticasone (FLONASE) 50 MCG/ACT nasal spray Place 2 sprays into both nostrils daily. 16 g 6   levothyroxine (SYNTHROID) 75 MCG  tablet TAKE 1 TABLET BY MOUTH EVERY DAY 90 tablet 1   Multiple Vitamin (MULTIVITAMIN) capsule Take 1 capsule by mouth daily.     pantoprazole (PROTONIX) 40 MG tablet Take 1 tablet (40 mg total) by mouth daily. 90 tablet 0   No current facility-administered medications on file prior to visit.    LABS/IMAGING: No results found for this or any previous visit (from the past 48 hour(s)).  No results found.  LIPID PANEL:    Component Value Date/Time   CHOL 219 (H) 10/17/2022 0910   TRIG 55 10/17/2022 0910   HDL 84 10/17/2022 0910   CHOLHDL 2.6 10/17/2022 0910   CHOLHDL 4 07/09/2022 0845   VLDL 12.6 07/09/2022 0845   LDLCALC 125 (H) 10/17/2022 0910    WEIGHTS: Wt Readings from Last 3 Encounters:  03/18/23 217 lb 6.4 oz (98.6 kg)  02/01/23 216 lb 8 oz (98.2 kg)  01/09/23 216 lb (98 kg)    VITALS: BP (!) 144/76   Pulse 66   Ht 5\' 4"  (1.626 m)   Wt 217 lb 6.4 oz (98.6 kg)   SpO2 98%   BMI 37.32 kg/m   EXAM: Deferred  EKG: Deferred  ASSESSMENT: Mixed dyslipidemia, goal LDL less than 70 Statin intolerant-myalgias Aortic atherosclerosis Hypertension PAF  PLAN: 1.   Ms. Geraghty has had improvement in her lipids with LDL now down close to 100.  She is tolerating Repatha well.  There is probably room for further improvement.  I would like to  see her LDL closer to 70 if possible but this may be challenging given a high LP(a).  She is tolerating Repatha well.  She will continue work on diet and exercise.  Will will plan repeat lipids in about 6 months.  I will also see her at that time for general cardiology including follow-up of her A-fib with an EKG at that appointment.  Pixie Casino, MD, [facility], [place] Director of the Advanced Lipid Disorders &  Cardiovascular Risk Reduction Clinic Diplomate of the American Board of Clinical Lipidology Attending Cardiologist  Direct Dial: [phone]  Fax: [phone]  Website:  [email or web address] 03/18/2023, 2:50 PM

## 2023-03-18 NOTE — Patient Instructions (Addendum)
Medication Instructions:  NO CHANGES  *If you need a refill on your cardiac medications before your next appointment, please call your pharmacy*   Lab Work: FASTING lab work to check cholesterol in 6 months  If you have labs (blood work) drawn today and your tests are completely normal, you will receive your results only by: [place] (if you have MyChart) OR A paper copy in the mail If you have any lab test that is abnormal or we need to change your treatment, we will call you to review the results.   Testing/Procedures: NONE   Follow-Up: At [facility], you and your health needs are our priority.  As part of our continuing mission to provide you with exceptional heart care, we have created designated Provider Care Teams.  These Care Teams include your primary Cardiologist (physician) and Advanced Practice Providers (APPs -  Physician Assistants and Nurse Practitioners) who all work together to provide you with the care you need, when you need it.  We recommend signing up for the patient portal called "MyChart".  Sign up information is provided on this After Visit Summary.  MyChart is used to connect with patients for Virtual Visits (Telemedicine).  Patients are able to view lab/test results, encounter notes, upcoming appointments, etc.  Non-urgent messages can be sent to your provider as well.   To learn more about what you can do with MyChart, go to [email or web address].    Your next appointment:    6 months with Dr. Debara Pickett -- lipids and EKG

## 2023-04-01 ENCOUNTER — Other Ambulatory Visit: Payer: Self-pay | Admitting: Internal Medicine

## 2023-04-01 ENCOUNTER — Ambulatory Visit (INDEPENDENT_AMBULATORY_CARE_PROVIDER_SITE_OTHER): Payer: 59

## 2023-04-01 ENCOUNTER — Ambulatory Visit (INDEPENDENT_AMBULATORY_CARE_PROVIDER_SITE_OTHER): Payer: 59 | Admitting: Internal Medicine

## 2023-04-01 VITALS — BP 160/90 | HR 66 | Temp 98.4°F | Ht 64.0 in | Wt 215.0 lb

## 2023-04-01 DIAGNOSIS — R109 Unspecified abdominal pain: Secondary | ICD-10-CM | POA: Diagnosis not present

## 2023-04-01 DIAGNOSIS — I700 Atherosclerosis of aorta: Secondary | ICD-10-CM

## 2023-04-01 DIAGNOSIS — I10 Essential (primary) hypertension: Secondary | ICD-10-CM

## 2023-04-01 DIAGNOSIS — R1084 Generalized abdominal pain: Secondary | ICD-10-CM

## 2023-04-01 LAB — URINALYSIS, ROUTINE W REFLEX MICROSCOPIC
Bilirubin Urine: NEGATIVE
Ketones, ur: NEGATIVE
Leukocytes,Ua: NEGATIVE
Nitrite: NEGATIVE
Specific Gravity, Urine: 1.01 (ref 1.000–1.030)
Total Protein, Urine: NEGATIVE
Urine Glucose: NEGATIVE
Urobilinogen, UA: 0.2 (ref 0.0–1.0)
pH: 7 (ref 5.0–8.0)

## 2023-04-01 LAB — CBC WITH DIFFERENTIAL/PLATELET
Basophils Absolute: 0 10*3/uL (ref 0.0–0.1)
Basophils Relative: 0.7 % (ref 0.0–3.0)
Eosinophils Absolute: 0.1 10*3/uL (ref 0.0–0.7)
Eosinophils Relative: 1.5 % (ref 0.0–5.0)
HCT: 39.4 % (ref 36.0–46.0)
Hemoglobin: 12.8 g/dL (ref 12.0–15.0)
Lymphocytes Relative: 30 % (ref 12.0–46.0)
Lymphs Abs: 1.4 10*3/uL (ref 0.7–4.0)
MCHC: 32.4 g/dL (ref 30.0–36.0)
MCV: 78.5 fl (ref 78.0–100.0)
Monocytes Absolute: 0.4 10*3/uL (ref 0.1–1.0)
Monocytes Relative: 8.1 % (ref 3.0–12.0)
Neutro Abs: 2.8 10*3/uL (ref 1.4–7.7)
Neutrophils Relative %: 59.7 % (ref 43.0–77.0)
Platelets: 212 10*3/uL (ref 150.0–400.0)
RBC: 5.02 Mil/uL (ref 3.87–5.11)
RDW: 16.1 % — ABNORMAL HIGH (ref 11.5–15.5)
WBC: 4.6 10*3/uL (ref 4.0–10.5)

## 2023-04-01 LAB — LIPASE: Lipase: 40 U/L (ref 11.0–59.0)

## 2023-04-01 LAB — BASIC METABOLIC PANEL
BUN: 11 mg/dL (ref 6–23)
CO2: 30 mEq/L (ref 19–32)
Calcium: 9.4 mg/dL (ref 8.4–10.5)
Chloride: 102 mEq/L (ref 96–112)
Creatinine, Ser: 0.91 mg/dL (ref 0.40–1.20)
GFR: 64.02 mL/min (ref >60.00)
Glucose, Bld: 89 mg/dL (ref 70–99)
Potassium: 3.9 mEq/L (ref 3.5–5.1)
Sodium: 136 mEq/L (ref 135–145)

## 2023-04-01 LAB — HEPATIC FUNCTION PANEL
ALT: 10 U/L (ref 0–35)
AST: 17 U/L (ref 0–37)
Albumin: 4.1 g/dL (ref 3.5–5.2)
Alkaline Phosphatase: 105 U/L (ref 39–117)
Bilirubin, Direct: 0.1 mg/dL (ref 0.0–0.3)
Total Bilirubin: 0.5 mg/dL (ref 0.2–1.2)
Total Protein: 7.3 g/dL (ref 6.0–8.3)

## 2023-04-01 MED ORDER — PANTOPRAZOLE SODIUM 40 MG PO TBEC: 40.0000 mg | DELAYED_RELEASE_TABLET | Freq: Every day | ORAL | 3 refills | Status: DC

## 2023-04-01 NOTE — Progress Notes (Unsigned)
Patient ID: Tina Stone, female   DOB: 07/04/1950, 73 y.o.   MRN: [id number]        Chief Complaint: pt of Dr Ronnald Ramp with right mid abd pain, htn, aortic atherosclerosis       HPI:  Tina Stone is a 73 y.o. female here with c/o 2 mo onset mild intermittent right mid and upper abd pain, can radiate to the left abd at times, worse with solid food, ok with water, "nagging" in character but no n/v/d, fever, blood, overt constipation, and no recent nsaid or ETOH use.  Pt is s/p CCX.  No hx of PUD or pancreatitis.  Pt denies chest pain, increased sob or doe, wheezing, orthopnea, PND, increased LE swelling, palpitations, dizziness or syncope.   Pt denies polydipsia, polyuria, or new focal neuro s/s.   BP has been controlled at home recently.         Wt Readings from Last 3 Encounters:  04/01/23 215 lb (97.5 kg)  03/18/23 217 lb 6.4 oz (98.6 kg)  02/01/23 216 lb 8 oz (98.2 kg)   BP Readings from Last 3 Encounters:  04/01/23 (!) 160/90  03/18/23 (!) 144/76  02/01/23 136/78         Past Medical History:  Diagnosis Date   Adenomatous colon polyp    Allergic rhinitis, seasonal    Allergy    Atrial fibrillation    Beta thalassemia trait 11/25/2013   Cholelithiasis    Class 3 obesity without serious comorbidity with body mass index (BMI) of 40.0 to 44.9 in adult 11/19/2012   endometrial ca dx'd 08/2009   endometrial    GERD (gastroesophageal reflux disease)    H. pylori infection    History of radiation therapy    endometrial - 02/19/2022-03/23/2022 Dr Gery Pray   HLD (hyperlipidemia)    Hypercholesterolemia    Hypertension 03/18/2017   no meds    Hypothyroidism    Intraductal papilloma of left breast    Patient underwent left needle-localized lumpectomy by Dr. Imogene Burn. [place] on 09/09/2013; pathology showed intraductal papilloma with no atypia or malignancy identified.   PONV (postoperative nausea and vomiting)    Pre-diabetes    pt denies   Urticaria    Uterine fibroid     Past Surgical History:  Procedure Laterality Date   ANKLE FRACTURE SURGERY Right 1966   right   BREAST EXCISIONAL BIOPSY Left 2014   benign   BREAST LUMPECTOMY WITH NEEDLE LOCALIZATION Left 09/09/2013   Procedure: BREAST LUMPECTOMY WITH NEEDLE LOCALIZATION;  Surgeon: Imogene Burn. Georgette Dover, MD;  Location: [facility];  Service: General;  Laterality: Left;   CHOLECYSTECTOMY     COLONOSCOPY     IR IMAGING GUIDED PORT INSERTION  05/18/2020   IR REMOVAL TUN ACCESS W/ PORT W/O FL MOD SED  01/02/2023   POLYPECTOMY     ROBOTIC ASSISTED LAPAROSCOPIC CHOLECYSTECTOMY  09/09/2019   ROBOTIC ASSISTED TOTAL HYSTERECTOMY WITH BILATERAL SALPINGO OOPHERECTOMY N/A 10/22/2019   Procedure: XI ROBOTIC ASSISTED TOTAL HYSTERECTOMY WITH BILATERAL SALPINGO OOPHORECTOMY GREATER THAN 250 GRAMS, MINI LAPARTOMY FOR SPECIMEN DELIVERY; PELVIC AND PERI-AORTIC LYMPHADENECTOMY;  Surgeon: Everitt Amber, MD;  Location: [facility];  Service: Gynecology;  Laterality: N/A;   SENTINEL NODE BIOPSY N/A 10/22/2019   Procedure: SENTINEL NODE BIOPSY;  Surgeon: Everitt Amber, MD;  Location: [facility];  Service: Gynecology;  Laterality: N/A;    reports that she has never smoked. She has never used smokeless tobacco. She reports that she does not drink alcohol and  does not use drugs. family history includes Asthma in her brother and paternal grandmother; Colon polyps (age of onset: 34) in her mother; Congestive Heart Failure in her father; Dementia (age of onset: 14) in her mother; Diabetes in her brother, father, maternal grandmother, mother, and paternal grandfather; Hypertension in her brother, brother, maternal grandmother, and mother; Lung disease in her paternal grandmother; Pancreatic cancer in her paternal aunt; Uterine cancer in her paternal aunt. Allergies  Allergen Reactions   Benicar Hct [Olmesartan Medoxomil-Hctz] Shortness Of Breath and Palpitations   Iodinated Contrast Media Hives and Itching    02/02/2021-  pt developed 2 hives and itching even  with the 13hr premedication.  Radiology PA came into eval the pt.  Developed itching and hives after injection on 05/10/20; needs 13hr prep in future   Crestor [Rosuvastatin] Other (See Comments)    Muscle aches   Bactrim [Sulfamethoxazole-Trimethoprim] Other (See Comments)    Abdominal pain, dizziness   Pravachol [Pravastatin] Other (See Comments)    Lower abdominal pain   Shellfish Allergy Nausea And Vomiting   Amlodipine Palpitations   Current Outpatient Medications on File Prior to Visit  Medication Sig Dispense Refill   acetaminophen (TYLENOL) 500 MG tablet Take 1,000 mg by mouth every 6 (six) hours as needed for moderate pain.     APPLE CIDER VINEGAR PO Take 5 mLs by mouth daily.      atenolol (TENORMIN) 25 MG tablet TAKE 1 TABLET (25 MG TOTAL) BY MOUTH DAILY. 90 tablet 1   Cholecalciferol (VITAMIN D3) 50 MCG (2000 UT) TABS Take by mouth.     ELIQUIS 5 MG TABS tablet TAKE 1 TABLET BY MOUTH TWICE A DAY 180 tablet 0   Evolocumab (REPATHA SURECLICK) [id number] MG/ML SOAJ Inject 1 Pen into the skin every 14 (fourteen) days. 2 mL 11   levothyroxine (SYNTHROID) 75 MCG tablet TAKE 1 TABLET BY MOUTH EVERY DAY 90 tablet 1   Multiple Vitamin (MULTIVITAMIN) capsule Take 1 capsule by mouth daily.     fluticasone (FLONASE) 50 MCG/ACT nasal spray Place 2 sprays into both nostrils daily. (Patient not taking: Reported on 04/01/2023) 16 g 6   No current facility-administered medications on file prior to visit.        ROS:  All others reviewed and negative.  Objective        PE:  BP (!) 160/90 (BP Location: Left Arm, Patient Position: Sitting, Cuff Size: Normal)   Pulse 66   Temp 98.4 F (36.9 C) (Oral)   Ht 5\' 4"  (1.626 m)   Wt 215 lb (97.5 kg)   BMI 36.90 kg/m                 Constitutional: Pt appears in NAD               HENT: Head: NCAT.                Right Ear: External ear normal.                 Left Ear: External ear normal.                Eyes: . Pupils are equal, round, and reactive  to light. Conjunctivae and EOM are normal               Nose: without d/c or deformity               Neck: Neck supple. Gross normal ROM  Cardiovascular: Normal rate and regular rhythm.                 Pulmonary/Chest: Effort normal and breath sounds without rales or wheezing.                Abd:  Soft, NT, ND, + BS, no organomegaly               Neurological: Pt is alert. At baseline orientation, motor grossly intact               Skin: Skin is warm. No rashes, no other new lesions, LE edema - none               Psychiatric: Pt behavior is normal without agitation   Micro: none  Cardiac tracings I have personally interpreted today:  none  Pertinent Radiological findings (summarize): none   Lab Results  Component Value Date   WBC 4.6 04/01/2023   HGB 12.8 04/01/2023   HCT 39.4 04/01/2023   PLT 212.0 04/01/2023   GLUCOSE 89 04/01/2023   CHOL 219 (H) 10/17/2022   TRIG 55 10/17/2022   HDL 84 10/17/2022   LDLCALC 125 (H) 10/17/2022   ALT 10 04/01/2023   AST 17 04/01/2023   NA 136 04/01/2023   K 3.9 04/01/2023   CL 102 04/01/2023   CREATININE 0.91 04/01/2023   BUN 11 04/01/2023   CO2 30 04/01/2023   TSH 1.66 01/09/2023   INR 1.0 01/28/2021   HGBA1C 5.3 02/02/2022   Assessment/Plan:  Afomia Hiracheta is a 73 y.o. Black or African American [2] female with  has a past medical history of Adenomatous colon polyp, Allergic rhinitis, seasonal, Allergy, Atrial fibrillation, Beta thalassemia trait (11/25/2013), Cholelithiasis, Class 3 obesity without serious comorbidity with body mass index (BMI) of 40.0 to 44.9 in adult (11/19/2012), endometrial ca (dx'd 08/2009), GERD (gastroesophageal reflux disease), H. pylori infection, History of radiation therapy, HLD (hyperlipidemia), Hypercholesterolemia, Hypertension (03/18/2017), Hypothyroidism, Intraductal papilloma of left breast, PONV (postoperative nausea and vomiting), Pre-diabetes, Urticaria, and Uterine fibroid.  Abdominal  pain More right than left sided, s/p ccx, without fever, vomiting, blood and no recent nsaid or ETOH use, for labs as ordered including LFTs, lipase, cbc and xray r/o constipation if seen, and trial protonix 40 mg qd  Essential hypertension BP Readings from Last 3 Encounters:  04/01/23 (!) 160/90  03/18/23 (!) 144/76  02/01/23 136/78   Uncontrolled here but pt states controlled at home, , pt to continue medical treatment tenormin 25 mg qd, declines change today in med tx   Aortic atherosclerosis ([place]) Noted on last CT, for lower chol diet, repatha, excercise  Followup: Return if symptoms worsen or fail to improve.  Cathlean Cower, MD 04/02/2023 9:36 PM [facility]

## 2023-04-01 NOTE — Patient Instructions (Addendum)
Please take all new medication as prescribed - the protonix 40 mg per day  Please continue all other medications as before, and refills have been done if requested.  Please have the pharmacy call with any other refills you may need.  Please continue your efforts at being more active, low cholesterol diet, and weight control.  Please keep your appointments with your specialists as you may have planned  Please go to the XRAY Department in the first floor for the x-ray testing  Please go to the LAB at the blood drawing area for the tests to be done You will be contacted by phone if any changes need to be made immediately.  Otherwise, you will receive a letter about your results with an explanation, but please check with MyChart first.  Please remember to sign up for MyChart if you have not done so, as this will be important to you in the future with finding out test results, communicating by private email, and scheduling acute appointments online when needed.

## 2023-04-02 ENCOUNTER — Encounter: Payer: Self-pay | Admitting: Internal Medicine

## 2023-04-02 NOTE — Assessment & Plan Note (Signed)
BP Readings from Last 3 Encounters:  04/01/23 (!) 160/90  03/18/23 (!) 144/76  02/01/23 136/78   Uncontrolled here but pt states controlled at home, , pt to continue medical treatment tenormin 25 mg qd, declines change today in med tx

## 2023-04-02 NOTE — Assessment & Plan Note (Addendum)
More right than left sided, s/p ccx, without fever, vomiting, blood and no recent nsaid or ETOH use, for labs as ordered including LFTs, lipase, cbc and xray r/o constipation if seen, and trial protonix 40 mg qd

## 2023-04-02 NOTE — Assessment & Plan Note (Signed)
Noted on last CT, for lower chol diet, repatha, excercise

## 2023-04-08 ENCOUNTER — Ambulatory Visit
Admission: RE | Admit: 2023-04-08 | Discharge: 2023-04-08 | Disposition: A | Discharge status: Home / Self Care | Payer: 59 | Source: Ambulatory Visit | Attending: Internal Medicine | Admitting: Internal Medicine

## 2023-04-08 DIAGNOSIS — R3129 Other microscopic hematuria: Secondary | ICD-10-CM | POA: Diagnosis not present

## 2023-04-08 DIAGNOSIS — R1084 Generalized abdominal pain: Secondary | ICD-10-CM

## 2023-04-08 DIAGNOSIS — I898 Other specified noninfective disorders of lymphatic vessels and lymph nodes: Secondary | ICD-10-CM | POA: Diagnosis not present

## 2023-04-08 DIAGNOSIS — M47816 Spondylosis without myelopathy or radiculopathy, lumbar region: Secondary | ICD-10-CM | POA: Diagnosis not present

## 2023-04-08 DIAGNOSIS — K5730 Diverticulosis of large intestine without perforation or abscess without bleeding: Secondary | ICD-10-CM | POA: Diagnosis not present

## 2023-04-09 ENCOUNTER — Ambulatory Visit (HOSPITAL_BASED_OUTPATIENT_CLINIC_OR_DEPARTMENT_OTHER): Payer: Medicare Other | Admitting: Internal Medicine

## 2023-05-02 ENCOUNTER — Encounter: Payer: Self-pay | Admitting: Emergency Medicine

## 2023-05-02 ENCOUNTER — Ambulatory Visit (INDEPENDENT_AMBULATORY_CARE_PROVIDER_SITE_OTHER): Payer: 59 | Admitting: Emergency Medicine

## 2023-05-02 VITALS — BP 130/78 | HR 70 | Temp 98.3°F | Ht 64.0 in | Wt 219.0 lb

## 2023-05-02 DIAGNOSIS — Z20822 Contact with and (suspected) exposure to covid-19: Secondary | ICD-10-CM

## 2023-05-02 DIAGNOSIS — R6889 Other general symptoms and signs: Secondary | ICD-10-CM

## 2023-05-02 DIAGNOSIS — J069 Acute upper respiratory infection, unspecified: Secondary | ICD-10-CM

## 2023-05-02 LAB — POC COVID19 BINAXNOW: SARS Coronavirus 2 Ag: NEGATIVE

## 2023-05-02 NOTE — Patient Instructions (Signed)

## 2023-05-02 NOTE — Progress Notes (Signed)
Tina Stone 73 y.o.   Chief Complaint  Patient presents with   Headache    Mild headache, congestion , was exposed to COVID     HISTORY OF PRESENT ILLNESS: Acute problem visit today.  Patient of Dr. Sanda Linger. This is a 73 y.o. female recently exposed to known COVID case complaining of mild headache and congestion for the last 5 days Slowly getting better No other associated symptoms  Headache  Pertinent negatives include no abdominal pain, coughing, fever, nausea, sore throat or vomiting.     Prior to Admission medications   Medication Sig Start Date End Date Taking? Authorizing Provider  acetaminophen (TYLENOL) 500 MG tablet Take 1,000 mg by mouth every 6 (six) hours as needed for moderate pain.   Yes [provider]  APPLE CIDER VINEGAR PO Take 5 mLs by mouth daily.    Yes [provider]  atenolol (TENORMIN) 25 MG tablet TAKE 1 TABLET (25 MG TOTAL) BY MOUTH DAILY. 01/15/23  Yes Etta Grandchild, MD  Cholecalciferol (VITAMIN D3) 50 MCG (2000 UT) TABS Take by mouth.   Yes [provider]  ELIQUIS 5 MG TABS tablet TAKE 1 TABLET BY MOUTH TWICE A DAY 02/18/23  Yes Etta Grandchild, MD  Evolocumab (REPATHA SURECLICK) 140 MG/ML SOAJ Inject 1 Pen into the skin every 14 (fourteen) days. 08/06/22  Yes Lyn Records, MD  levothyroxine (SYNTHROID) 75 MCG tablet TAKE 1 TABLET BY MOUTH EVERY DAY 01/15/23  Yes Etta Grandchild, MD  Multiple Vitamin (MULTIVITAMIN) capsule Take 1 capsule by mouth daily.   Yes [provider]  pantoprazole (PROTONIX) 40 MG tablet Take 1 tablet (40 mg total) by mouth daily. 04/01/23  Yes Corwin Levins, MD  fluticasone [facility]) 50 MCG/ACT nasal spray Place 2 sprays into both nostrils daily. Patient not taking: Reported on 04/01/2023 02/01/23   Elenore Paddy, NP    Allergies  Allergen Reactions   Benicar Hct [Olmesartan Medoxomil-Hctz] Shortness Of Breath and Palpitations   Iodinated Contrast Media Hives and Itching     02/02/2021-  pt developed 2 hives and itching even with the 13hr premedication.  Radiology PA came into eval the pt.  Developed itching and hives after injection on 05/10/20; needs 13hr prep in future   Crestor [Rosuvastatin] Other (See Comments)    Muscle aches   Bactrim [Sulfamethoxazole-Trimethoprim] Other (See Comments)    Abdominal pain, dizziness   Pravachol [Pravastatin] Other (See Comments)    Lower abdominal pain   Shellfish Allergy Nausea And Vomiting   Amlodipine Palpitations    Patient Active Problem List   Diagnosis Date Noted   Abdominal pain 04/01/2023   Congestion of nasal sinus 02/01/2023   Polyp of colon 01/09/2023   Benign liver cyst 12/21/2022   Right hip pain 08/28/2022   Aortic atherosclerosis (HCC) 08/01/2022   Chronic idiopathic urticaria 11/22/2021   Chronic anticoagulation 10/02/2021   Visit for screening mammogram 08/18/2021   Acquired hypothyroidism 08/18/2021   Allergic rhinoconjunctivitis 08/18/2021   Encounter for general adult medical examination with abnormal findings 08/18/2021   Hyperlipidemia with target LDL less than 100 08/17/2021   Laryngitis, chronic 08/17/2021   Paroxysmal atrial fibrillation (HCC) 02/03/2021   Secondary hypercoagulable state (HCC) 02/03/2021   Essential hypertension 11/16/2020   Vitamin D deficiency 10/27/2020   Endometrial cancer (HCC) 10/22/2019   Viral respiratory infection 04/01/2018   Acute cough 02/02/2014   Beta thalassemia trait 11/25/2013   Class II obesity 11/19/2012   Gastroesophageal reflux  disease 01/28/2007    Past Medical History:  Diagnosis Date   Adenomatous colon polyp    Allergic rhinitis, seasonal    Allergy    Atrial fibrillation (HCC)    Beta thalassemia trait 11/25/2013   Cholelithiasis    Class 3 obesity without serious comorbidity with body mass index (BMI) of 40.0 to 44.9 in adult 11/19/2012   endometrial ca dx'd 08/2009   endometrial    GERD (gastroesophageal reflux disease)    H.  pylori infection    History of radiation therapy    endometrial - 02/19/2022-03/23/2022 Dr Antony Blackbird   HLD (hyperlipidemia)    Hypercholesterolemia    Hypertension 03/18/2017   no meds    Hypothyroidism    Intraductal papilloma of left breast    Patient underwent left needle-localized lumpectomy by Dr. Wilmon Arms. Tsuei on 09/09/2013; pathology showed intraductal papilloma with no atypia or malignancy identified.   PONV (postoperative nausea and vomiting)    Pre-diabetes    pt denies   Urticaria    Uterine fibroid     Past Surgical History:  Procedure Laterality Date   ANKLE FRACTURE SURGERY Right 1966   right   BREAST EXCISIONAL BIOPSY Left 2014   benign   BREAST LUMPECTOMY WITH NEEDLE LOCALIZATION Left 09/09/2013   Procedure: BREAST LUMPECTOMY WITH NEEDLE LOCALIZATION;  Surgeon: Wilmon Arms. Corliss Skains, MD;  Location: [facility] OR;  Service: General;  Laterality: Left;   CHOLECYSTECTOMY     COLONOSCOPY     IR IMAGING GUIDED PORT INSERTION  05/18/2020   IR REMOVAL TUN ACCESS W/ PORT W/O FL MOD SED  01/02/2023   POLYPECTOMY     ROBOTIC ASSISTED LAPAROSCOPIC CHOLECYSTECTOMY  09/09/2019   ROBOTIC ASSISTED TOTAL HYSTERECTOMY WITH BILATERAL SALPINGO OOPHERECTOMY N/A 10/22/2019   Procedure: XI ROBOTIC ASSISTED TOTAL HYSTERECTOMY WITH BILATERAL SALPINGO OOPHORECTOMY GREATER THAN 250 GRAMS, MINI LAPARTOMY FOR SPECIMEN DELIVERY; PELVIC AND PERI-AORTIC LYMPHADENECTOMY;  Surgeon: Adolphus Birchwood, MD;  Location: [facility] ORS;  Service: Gynecology;  Laterality: N/A;   SENTINEL NODE BIOPSY N/A 10/22/2019   Procedure: SENTINEL NODE BIOPSY;  Surgeon: Adolphus Birchwood, MD;  Location: [facility];  Service: Gynecology;  Laterality: N/A;    Social History   Socioeconomic History   Marital status: Widowed    Spouse name: Not on file   Number of children: 0   Years of education: Not on file   Highest education level: Bachelor's degree (e.g., BA, AB, BS)  Occupational History   Occupation: Geographical information systems officer  Tobacco Use   Smoking  status: Never   Smokeless tobacco: Never   Tobacco comments:    few puffs but not a true smoker quit many yrs ago  Vaping Use   Vaping Use: Never used  Substance and Sexual Activity   Alcohol use: Never   Drug use: Never   Sexual activity: Not Currently  Other Topics Concern   Not on file  Social History Narrative   ** Merged History Encounter **       Lives in [place], widowed 2003   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         Social Determinants of Health   Financial Resource Strain: Medium Risk (03/28/2023)   Overall Financial Resource Strain (CARDIA)    Difficulty of Paying Living Expenses: Somewhat hard  Food Insecurity: Food Insecurity Present (03/28/2023)   Hunger Vital Sign    Worried About Running Out of Food in the Last Year: Sometimes true    Ran Out of Food in the  Last Year: Sometimes true  Transportation Needs: No Transportation Needs (03/28/2023)   PRAPARE - Administrator, Civil Service (Medical): No    Lack of Transportation (Non-Medical): No  Physical Activity: Sufficiently Active (03/28/2023)   Exercise Vital Sign    Days of Exercise per Week: 3 days    Minutes of Exercise per Session: 60 min  Stress: No Stress Concern Present (03/28/2023)   [employer] of Occupational Health - Occupational Stress Questionnaire    Feeling of Stress : Not at all  Social Connections: Moderately Integrated (03/28/2023)   Social Connection and Isolation Panel [NHANES]    Frequency of Communication with Friends and Family: More than three times a week    Frequency of Social Gatherings with Friends and Family: More than three times a week    Attends Religious Services: More than 4 times per year    Active Member of [employer] or Organizations: Yes    Attends Banker Meetings: More than 4 times per year    Marital Status: Widowed  Intimate Partner Violence: Not At Risk (05/15/2022)   Humiliation, Afraid, Rape, and Kick questionnaire    Fear of  Current or Ex-Partner: No    Emotionally Abused: No    Physically Abused: No    Sexually Abused: No    Family History  Problem Relation Age of Onset   Diabetes Mother    Hypertension Mother    Colon polyps Mother 66   Dementia Mother 13   Diabetes Father    Congestive Heart Failure Father    Asthma Brother    Diabetes Brother    Hypertension Brother    Hypertension Brother    Hypertension Maternal Grandmother    Diabetes Maternal Grandmother    Asthma Paternal Grandmother    Lung disease Paternal Grandmother    Diabetes Paternal Grandfather    Pancreatic cancer Paternal Aunt    Uterine cancer Paternal Aunt    Colon cancer Neg Hx    Breast cancer Neg Hx    Lung cancer Neg Hx    Esophageal cancer Neg Hx    Rectal cancer Neg Hx    Stomach cancer Neg Hx      Review of Systems  Constitutional: Negative.  Negative for chills and fever.  HENT:  Positive for congestion. Negative for sore throat.   Respiratory: Negative.  Negative for cough and shortness of breath.   Cardiovascular: Negative.  Negative for chest pain and palpitations.  Gastrointestinal:  Negative for abdominal pain, diarrhea, nausea and vomiting.  Genitourinary: Negative.  Negative for dysuria and hematuria.  Skin: Negative.  Negative for rash.  Neurological:  Positive for headaches.  All other systems reviewed and are negative.   Vitals:   05/02/23 1433  BP: 130/78  Pulse: 70  Temp: 98.3 F (36.8 C)  SpO2: 98%    Physical Exam Vitals reviewed.  Constitutional:      Appearance: She is well-developed.  HENT:     Head: Normocephalic.     Mouth/Throat:     Mouth: Mucous membranes are moist.     Pharynx: Oropharynx is clear.  Eyes:     Extraocular Movements: Extraocular movements intact.     Conjunctiva/sclera: Conjunctivae normal.     Pupils: Pupils are equal, round, and reactive to light.  Cardiovascular:     Rate and Rhythm: Normal rate and regular rhythm.     Pulses: Normal pulses.      Heart sounds: Normal heart sounds.  Pulmonary:  Effort: Pulmonary effort is normal.     Breath sounds: Normal breath sounds.  Musculoskeletal:     Cervical back: No tenderness.  Lymphadenopathy:     Cervical: No cervical adenopathy.  Skin:    General: Skin is warm and dry.     Capillary Refill: Capillary refill takes less than 2 seconds.  Neurological:     Mental Status: She is alert and oriented to person, place, and time.  Psychiatric:        Mood and Affect: Mood normal.        Behavior: Behavior normal.    Results for orders placed or performed in visit on 05/02/23 (from the past 24 hour(s))  POC COVID-19     Status: Normal   Collection Time: 05/02/23  2:53 PM  Result Value Ref Range   SARS Coronavirus 2 Ag Negative Negative     ASSESSMENT & PLAN: A total of 33 minutes was spent with the patient and counseling/coordination of care regarding preparing for this visit, review of most recent office visit notes, review of chronic medical conditions under management, review of all medications, diagnosis of upper viral respiratory infection and management, prognosis, documentation, and need for follow-up.  Problem List Items Addressed This Visit       Respiratory   Upper respiratory infection, viral - Primary    Stable and running its course without complications Symptom management discussed Advised to rest and stay well-hydrated Tylenol and or Advil as needed No concerns identified.      Other Visit Diagnoses     Flu-like symptoms       Relevant Orders   POC COVID-19 (Completed)   Exposure to COVID-19 virus          Patient Instructions  Viral Respiratory Infection A viral respiratory infection is an illness that affects parts of the body that are used for breathing. These include the lungs, nose, and throat. It is caused by a germ called a virus. Some examples of this kind of infection are: A cold. The flu (influenza). A respiratory syncytial virus (RSV)  infection. What are the causes? This condition is caused by a virus. It spreads from person to person. You can get the virus if: You breathe in droplets from someone who is sick. You come in contact with people who are sick. You touch mucus or other fluid from a person who is sick. What are the signs or symptoms? Symptoms of this condition include: A stuffy or runny nose. A sore throat. A cough. Shortness of breath. Trouble breathing. Yellow or green fluid in the nose. Other symptoms may include: A fever. Sweating or chills. Tiredness (fatigue). Achy muscles. A headache. How is this treated? This condition may be treated with: Medicines that treat viruses. Medicines that make it easy to breathe. Medicines that are sprayed into the nose. Acetaminophen or NSAIDs, such as ibuprofen, to treat fever. Follow these instructions at home: Managing pain and congestion Take over-the-counter and prescription medicines only as told by your doctor. If you have a sore throat, gargle with salt water. Do this 3-4 times a day or as needed. To make salt water, dissolve -1 tsp (3-6 g) of salt in 1 cup (237 mL) of warm water. Make sure that all the salt dissolves. Use nose drops made from salt water. This helps with stuffiness (congestion). It also helps soften the skin around your nose. Take 2 tsp (10 mL) of honey at bedtime to lessen coughing at night. Do not give honey  to children who are younger than 26 year old. Drink enough fluid to keep your pee (urine) pale yellow. General instructions  Rest as much as possible. Do not drink alcohol. Do not smoke or use any products that contain nicotine or tobacco. If you need help quitting, ask your doctor. Keep all follow-up visits. How is this prevented?     Get a flu shot every year. Ask your doctor when you should get your flu shot. Do not let other people get your germs. If you are sick: Wash your hands with soap and water often. Wash your  hands after you cough or sneeze. Wash hands for at least 20 seconds. If you cannot use soap and water, use hand sanitizer. Cover your mouth when you cough. Cover your nose and mouth when you sneeze. Do not share cups or eating utensils. Clean commonly used objects often. Clean commonly touched surfaces. Stay home from work or school. Avoid contact with people who are sick during cold and flu season. This is in fall and winter. Get help if: Your symptoms last for 10 days or longer. Your symptoms get worse over time. You have very bad pain in your face or forehead. Parts of your jaw or neck get very swollen. You have shortness of breath. Get help right away if: You feel pain or pressure in your chest. You have trouble breathing. You faint or feel like you will faint. You keep vomiting and it gets worse. You feel confused. These symptoms may be an emergency. Get help right away. Call your local emergency services (911 in the [place] Do not wait to see if the symptoms will go away. Do not drive yourself to the hospital. Summary A viral respiratory infection is an illness that affects parts of the body that are used for breathing. Examples of this illness include a cold, the flu, and a respiratory syncytial virus (RSV) infection. The infection can cause a runny nose, cough, sore throat, and fever. Follow what your doctor tells you about taking medicines, drinking lots of fluid, washing your hands, resting at home, and avoiding people who are sick. This information is not intended to replace advice given to you by your health care provider. Make sure you discuss any questions you have with your health care provider. Document Revised: 03/23/2021 Document Reviewed: 03/23/2021 Elsevier Patient Education  2023 Elsevier Inc.    Edwina Barth, MD [facility]

## 2023-05-02 NOTE — Assessment & Plan Note (Signed)
Stable and running its course without complications Symptom management discussed Advised to rest and stay well-hydrated Tylenol and or Advil as needed No concerns identified.

## 2023-05-08 ENCOUNTER — Other Ambulatory Visit: Payer: Self-pay | Admitting: Internal Medicine

## 2023-05-08 DIAGNOSIS — H40013 Open angle with borderline findings, low risk, bilateral: Secondary | ICD-10-CM | POA: Diagnosis not present

## 2023-05-08 DIAGNOSIS — E039 Hypothyroidism, unspecified: Secondary | ICD-10-CM

## 2023-05-08 MED ORDER — UNITHROID 75 MCG PO TABS
75.0000 ug | ORAL_TABLET | Freq: Every day | ORAL | 0 refills | Status: DC
Start: 2023-05-08 — End: 2023-10-13

## 2023-05-09 ENCOUNTER — Other Ambulatory Visit: Payer: Self-pay | Admitting: Pharmacist

## 2023-05-09 MED ORDER — REPATHA SURECLICK 140 MG/ML ~~LOC~~ SOAJ: 140.0000 mg | SUBCUTANEOUS | 11 refills | Status: DC

## 2023-05-10 ENCOUNTER — Other Ambulatory Visit: Payer: Self-pay | Admitting: Internal Medicine

## 2023-05-10 DIAGNOSIS — I480 Paroxysmal atrial fibrillation: Secondary | ICD-10-CM

## 2023-05-10 DIAGNOSIS — I10 Essential (primary) hypertension: Secondary | ICD-10-CM

## 2023-05-13 ENCOUNTER — Other Ambulatory Visit: Payer: Self-pay | Admitting: Internal Medicine

## 2023-05-13 DIAGNOSIS — I480 Paroxysmal atrial fibrillation: Secondary | ICD-10-CM

## 2023-05-13 DIAGNOSIS — D6869 Other thrombophilia: Secondary | ICD-10-CM

## 2023-05-20 ENCOUNTER — Ambulatory Visit (INDEPENDENT_AMBULATORY_CARE_PROVIDER_SITE_OTHER): Payer: 59

## 2023-05-20 VITALS — BP 122/60 | HR 60 | Temp 97.7°F | Ht 64.0 in | Wt 221.4 lb

## 2023-05-20 DIAGNOSIS — Z0000 Encounter for general adult medical examination without abnormal findings: Secondary | ICD-10-CM | POA: Diagnosis not present

## 2023-05-20 NOTE — Telephone Encounter (Signed)
Tina Stone would like to discuss with Dr. Yetta Barre her obesity and wanting to try Ozempic to assist with weight loss. On 05/02/2023 Tina Stone weighed at 219 lbs.  Today Tina Stone weighed 221.4 lbs.  Tina Stone stated that she is having a hard time trying to maintain her weight due to her thyroid medication.  She stated that with the synthroid her appetite increases.  She stated that she exercises (water aerobics) 3 days a week for 1.5 hours, plus she is very active at work. This nurse advised Tina Stone that she would need to schedule an appointment to discuss this issue with Dr. Yetta Barre and that maybe being referred to weight management would be an option.  Airrion Otting N. Laquanta Hummel, LPN. [facility] AWV Team Direct Dial: [phone]

## 2023-05-20 NOTE — Progress Notes (Signed)
Subjective:   Tina Stone is a 73 y.o. female who presents for Medicare Annual (Subsequent) preventive examination.  Review of Systems     Cardiac Risk Factors include: advanced age (>53men, >48 women);dyslipidemia;family history of premature cardiovascular disease;hypertension;obesity (BMI >30kg/m2)     Objective:    Today's Vitals   05/20/23 0848 05/20/23 0855  BP: 122/60   Pulse: 60   Temp: 97.7 F (36.5 C)   TempSrc: Temporal   SpO2: 99%   Weight: 221 lb 6.4 oz (100.4 kg)   Height: 5\' 4"  (1.626 m)   PainSc: 0-No pain 0-No pain   Body mass index is 38 kg/m.     05/20/2023    8:57 AM 11/30/2022    8:49 AM 09/07/2022    9:21 AM 05/15/2022    3:29 PM 05/03/2022    8:27 AM 02/07/2022    7:56 AM 10/10/2021    4:07 AM  Advanced Directives  Does Patient Have a Medical Advance Directive? No No No No No No No  Would patient like information on creating a medical advance directive? No - Patient declined   No - Patient declined No - Patient declined No - Patient declined     Current Medications (verified) Outpatient Encounter Medications as of 05/20/2023  Medication Sig   acetaminophen (TYLENOL) 500 MG tablet Take 1,000 mg by mouth every 6 (six) hours as needed for moderate pain.   APPLE CIDER VINEGAR PO Take 5 mLs by mouth daily.    atenolol (TENORMIN) 25 MG tablet TAKE 1 TABLET (25 MG TOTAL) BY MOUTH DAILY.   Cholecalciferol (VITAMIN D3) 50 MCG (2000 UT) TABS Take by mouth.   ELIQUIS 5 MG TABS tablet TAKE 1 TABLET BY MOUTH TWICE A DAY   Evolocumab (REPATHA SURECLICK) 140 MG/ML SOAJ Inject 140 mg into the skin every 14 (fourteen) days.   Multiple Vitamin (MULTIVITAMIN) capsule Take 1 capsule by mouth daily.   UNITHROID 75 MCG tablet Take 1 tablet (75 mcg total) by mouth daily before breakfast.   [DISCONTINUED] fluticasone (FLONASE) 50 MCG/ACT nasal spray Place 2 sprays into both nostrils daily. (Patient not taking: Reported on 04/01/2023)   [DISCONTINUED] pantoprazole  (PROTONIX) 40 MG tablet Take 1 tablet (40 mg total) by mouth daily. (Patient not taking: Reported on 05/20/2023)   No facility-administered encounter medications on file as of 05/20/2023.    Allergies (verified) Benicar hct [olmesartan medoxomil-hctz], Iodinated contrast media, Crestor [rosuvastatin], Bactrim [sulfamethoxazole-trimethoprim], Pravachol [pravastatin], Shellfish allergy, and Amlodipine   History: Past Medical History:  Diagnosis Date   Adenomatous colon polyp    Allergic rhinitis, seasonal    Allergy    Atrial fibrillation (HCC)    Beta thalassemia trait 11/25/2013   Cholelithiasis    Class 3 obesity without serious comorbidity with body mass index (BMI) of 40.0 to 44.9 in adult 11/19/2012   endometrial ca dx'd 08/2009   endometrial    GERD (gastroesophageal reflux disease)    H. pylori infection    History of radiation therapy    endometrial - 02/19/2022-03/23/2022 Dr Antony Blackbird   HLD (hyperlipidemia)    Hypercholesterolemia    Hypertension 03/18/2017   no meds    Hypothyroidism    Intraductal papilloma of left breast    Patient underwent left needle-localized lumpectomy by Dr. Wilmon Arms. Tsuei on 09/09/2013; pathology showed intraductal papilloma with no atypia or malignancy identified.   PONV (postoperative nausea and vomiting)    Pre-diabetes    pt denies   Urticaria  Uterine fibroid    Past Surgical History:  Procedure Laterality Date   ANKLE FRACTURE SURGERY Right 1966   right   BREAST EXCISIONAL BIOPSY Left 2014   benign   BREAST LUMPECTOMY WITH NEEDLE LOCALIZATION Left 09/09/2013   Procedure: BREAST LUMPECTOMY WITH NEEDLE LOCALIZATION;  Surgeon: Wilmon Arms. Corliss Skains, MD;  Location: [facility] OR;  Service: General;  Laterality: Left;   CHOLECYSTECTOMY     COLONOSCOPY     IR IMAGING GUIDED PORT INSERTION  05/18/2020   IR REMOVAL TUN ACCESS W/ PORT W/O FL MOD SED  01/02/2023   POLYPECTOMY     ROBOTIC ASSISTED LAPAROSCOPIC CHOLECYSTECTOMY  09/09/2019   ROBOTIC  ASSISTED TOTAL HYSTERECTOMY WITH BILATERAL SALPINGO OOPHERECTOMY N/A 10/22/2019   Procedure: XI ROBOTIC ASSISTED TOTAL HYSTERECTOMY WITH BILATERAL SALPINGO OOPHORECTOMY GREATER THAN 250 GRAMS, MINI LAPARTOMY FOR SPECIMEN DELIVERY; PELVIC AND PERI-AORTIC LYMPHADENECTOMY;  Surgeon: Adolphus Birchwood, MD;  Location: [facility];  Service: Gynecology;  Laterality: N/A;   SENTINEL NODE BIOPSY N/A 10/22/2019   Procedure: SENTINEL NODE BIOPSY;  Surgeon: Adolphus Birchwood, MD;  Location: [facility];  Service: Gynecology;  Laterality: N/A;   Family History  Problem Relation Age of Onset   Diabetes Mother    Hypertension Mother    Colon polyps Mother 52   Dementia Mother 62   Diabetes Father    Congestive Heart Failure Father    Asthma Brother    Diabetes Brother    Hypertension Brother    Hypertension Brother    Hypertension Maternal Grandmother    Diabetes Maternal Grandmother    Asthma Paternal Grandmother    Lung disease Paternal Grandmother    Diabetes Paternal Grandfather    Pancreatic cancer Paternal Aunt    Uterine cancer Paternal Aunt    Colon cancer Neg Hx    Breast cancer Neg Hx    Lung cancer Neg Hx    Esophageal cancer Neg Hx    Rectal cancer Neg Hx    Stomach cancer Neg Hx    Social History   Socioeconomic History   Marital status: Widowed    Spouse name: Not on file   Number of children: 0   Years of education: Not on file   Highest education level: Bachelor's degree (e.g., BA, AB, BS)  Occupational History   Occupation: Geographical information systems officer  Tobacco Use   Smoking status: Never   Smokeless tobacco: Never   Tobacco comments:    few puffs but not a true smoker quit many yrs ago  Vaping Use   Vaping Use: Never used  Substance and Sexual Activity   Alcohol use: Never   Drug use: Never   Sexual activity: Not Currently  Other Topics Concern   Not on file  Social History Narrative   ** Merged History Encounter **       Lives in [place], widowed 2003   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         Social Determinants of Health   Financial Resource Strain: Medium Risk (05/20/2023)   Overall Financial Resource Strain (CARDIA)    Difficulty of Paying Living Expenses: Somewhat hard  Food Insecurity: Food Insecurity Present (05/20/2023)   Hunger Vital Sign    Worried About Running Out of Food in the Last Year: Never true    Ran Out of Food in the Last Year: Sometimes true  Transportation Needs: No Transportation Needs (05/20/2023)   PRAPARE - Transportation    Lack of Transportation (Medical): No    Lack of  Transportation (Non-Medical): No  Physical Activity: Sufficiently Active (05/20/2023)   Exercise Vital Sign    Days of Exercise per Week: 3 days    Minutes of Exercise per Session: 70 min  Stress: No Stress Concern Present (05/20/2023)   [employer] of Occupational Health - Occupational Stress Questionnaire    Feeling of Stress : Only a little  Social Connections: Moderately Integrated (05/20/2023)   Social Connection and Isolation Panel [NHANES]    Frequency of Communication with Friends and Family: More than three times a week    Frequency of Social Gatherings with Friends and Family: More than three times a week    Attends Religious Services: More than 4 times per year    Active Member of [employer] or Organizations: Yes    Attends Banker Meetings: More than 4 times per year    Marital Status: Widowed    Tobacco Counseling Counseling given: Not Answered Tobacco comments: few puffs but not a true smoker quit many yrs ago   Clinical Intake:  Pre-visit preparation completed: Yes  Pain : No/denies pain Pain Score: 0-No pain     BMI - recorded: 38 Nutritional Status: BMI > 30  Obese Nutritional Risks: None Diabetes: No  How often do you need to have someone help you when you read instructions, pamphlets, or other written materials from your doctor or pharmacy?: 1 - Never What is the last grade level you completed in school?: Bachelor's  Degree  Diabetic? No  Interpreter Needed?: No  Information entered by :: Susie Cassette, LPN.   Activities of Daily Living    05/20/2023    9:04 AM 05/16/2023    3:20 PM  In your present state of health, do you have any difficulty performing the following activities:  Hearing? 0 0  Vision? 0 0  Difficulty concentrating or making decisions? 0 0  Walking or climbing stairs? 0 0  Dressing or bathing? 0 0  Doing errands, shopping? 0 0  Preparing Food and eating ? N N  Using the Toilet? N N  In the past six months, have you accidently leaked urine? N N  Do you have problems with loss of bowel control? N N  Managing your Medications? N N  Managing your Finances? N N  Housekeeping or managing your Housekeeping? N N    Patient Care Team: Etta Grandchild, MD as PCP - General (Internal Medicine) Lyn Records, MD (Inactive) as PCP - Cardiology (Cardiology) Tyson Alias, MD Diona Foley, MD as Consulting Physician (Ophthalmology)  Indicate any recent Medical Services you may have received from other than [facility] providers in the past year (date may be approximate).     Assessment:   This is a routine wellness examination for Pierre.  Hearing/Vision screen Hearing Screening - Comments:: Denies hearing difficulties   Vision Screening - Comments:: Wears rx glasses - up to date with routine eye exams with [facility]-Dr. Zenaida Niece   Dietary issues and exercise activities discussed: Current Exercise Habits: Home exercise routine, Type of exercise: Other - see comments (aqua therapy), Time (Minutes): > 60 (70 minutes), Frequency (Times/Week): 3, Weekly Exercise (Minutes/Week): 0, Intensity: Moderate, Exercise limited by: None identified   Goals Addressed             This Visit's Progress    My goal is to lose weight.  My weight goal is to be 180-187.        Depression Screen    05/20/2023  8:58 AM 05/02/2023    2:33 PM 04/01/2023    8:12 AM 02/01/2023     1:03 PM 01/09/2023    8:24 AM 12/12/2022    4:09 PM 08/24/2022    8:15 AM  PHQ 2/9 Scores  PHQ - 2 Score 0 0 0 0 0 0 0  PHQ- 9 Score 0    1      Fall Risk    05/20/2023    8:58 AM 05/16/2023    3:20 PM 05/02/2023    2:33 PM 04/01/2023    8:12 AM 02/02/2023    4:36 PM  Fall Risk   Falls in the past year? 1 1 0 0 1  Number falls in past yr: 0 0 0 0 0  Injury with Fall? 0 0 0 0 0  Risk for fall due to : No Fall Risks  No Fall Risks No Fall Risks   Follow up Falls prevention discussed  Falls evaluation completed Falls evaluation completed     FALL RISK PREVENTION PERTAINING TO THE HOME:  Any stairs in or around the home? No  If so, are there any without handrails? No  Home free of loose throw rugs in walkways, pet beds, electrical cords, etc? Yes  Adequate lighting in your home to reduce risk of falls? Yes   ASSISTIVE DEVICES UTILIZED TO PREVENT FALLS:  Life alert? No  Use of a cane, walker or w/c? No  Grab bars in the bathroom? No  Shower chair or bench in shower? No  Elevated toilet seat or a handicapped toilet? No   TIMED UP AND GO:  Was the test performed? Yes .  Length of time to ambulate 10 feet: 8 sec.   Gait steady and fast without use of assistive device  Cognitive Function:        05/20/2023    9:14 AM 05/15/2022    4:11 PM  6CIT Screen  What Year? 0 points 0 points  What month? 0 points 0 points  What time? 0 points 0 points  Count back from 20 0 points 0 points  Months in reverse 0 points 0 points  Repeat phrase 0 points 0 points  Total Score 0 points 0 points    Immunizations Immunization History  Administered Date(s) Administered   Fluad Quad(high Dose 65+) 09/16/2019, 10/31/2020, 09/14/2022   Influenza Split 10/25/2011, 10/22/2012   Influenza Whole 09/14/2010   Influenza,inj,Quad PF,6+ Mos 10/14/2013, 10/14/2014, 10/12/2015, 10/01/2016, 09/16/2017, 10/13/2018   Influenza-Unspecified 09/29/2021   PFIZER Comirnaty(Gray Top)Covid-19 Tri-Sucrose  Vaccine 08/01/2021   PFIZER(Purple Top)SARS-COV-2 Vaccination 03/31/2020, 04/25/2020, 09/01/2020, 08/01/2021   Pfizer Covid-19 Vaccine Bivalent Booster 37yrs & up 11/20/2021   Pneumococcal Conjugate-13 05/12/2018   Pneumococcal Polysaccharide-23 09/16/2019   Tdap 08/04/2013   Zoster Recombinat (Shingrix) 05/01/2021, 07/10/2021    TDAP status: Up to date  Flu Vaccine status: Up to date  Pneumococcal vaccine status: Up to date  Covid-19 vaccine status: Completed vaccines  Qualifies for Shingles Vaccine? Yes   Zostavax completed No   Shingrix Completed?: Yes  Screening Tests Health Maintenance  Topic Date Due   INFLUENZA VACCINE  08/01/2023   DTaP/Tdap/Td (2 - Td or Tdap) 08/05/2023   COLONOSCOPY (Pts 45-31yrs Insurance coverage will need to be confirmed)  10/07/2023   Medicare Annual Wellness (AWV)  05/19/2024   MAMMOGRAM  08/22/2024   Pneumonia Vaccine 92+ Years old  Completed   DEXA SCAN  Completed   Hepatitis C Screening  Completed   Zoster Vaccines- Shingrix  Completed   HPV VACCINES  Aged Out   COVID-19 Vaccine  Discontinued    Health Maintenance  There are no preventive care reminders to display for this patient.   Colorectal cancer screening: Type of screening: Colonoscopy. Completed 10/06/2018. Repeat every 5 years  Mammogram status: Completed 08/22/2022. Repeat every year  Bone Density status: Completed 10/22/2018. Results reflect: Bone density results: NORMAL. Repeat every 5 years.  Lung Cancer Screening: (Low Dose CT Chest recommended if Age 89-80 years, 30 pack-year currently smoking OR have quit w/in 15years.) does not qualify.   Lung Cancer Screening Referral: no  Additional Screening:  Hepatitis C Screening: does qualify; Completed 03/23/2015  Vision Screening: Recommended annual ophthalmology exams for early detection of glaucoma and other disorders of the eye. Is the patient up to date with their annual eye exam?  Yes  Who is the provider or what  is the name of the office in which the patient attends annual eye exams? Sharyn Dross, MD. If pt is not established with a provider, would they like to be referred to a provider to establish care? No .   Dental Screening: Recommended annual dental exams for proper oral hygiene  Community Resource Referral / Chronic Care Management: CRR required this visit?  No   CCM required this visit?  No      Plan:     I have personally reviewed and noted the following in the patient's chart:   Medical and social history Use of alcohol, tobacco or illicit drugs  Current medications and supplements including opioid prescriptions. Patient is not currently taking opioid prescriptions. Functional ability and status Nutritional status Physical activity Advanced directives List of other physicians Hospitalizations, surgeries, and ER visits in previous 12 months Vitals Screenings to include cognitive, depression, and falls Referrals and appointments  In addition, I have reviewed and discussed with patient certain preventive protocols, quality metrics, and best practice recommendations. A written personalized care plan for preventive services as well as general preventive health recommendations were provided to patient.     Mickeal Needy, LPN   1/61/0960   Nurse Notes:  Normal cognitive status assessed by direct observation by this Nurse Health Advisor. No abnormalities found.

## 2023-05-20 NOTE — Patient Instructions (Signed)
Tina Stone , Thank you for taking time to come for your Medicare Wellness Visit. I appreciate your ongoing commitment to your health goals. Please review the following plan we discussed and let me know if I can assist you in the future.   These are the goals we discussed:  Goals      My goal is to lose weight.  My weight goal is to be 180-187.        This is a list of the screening recommended for you and due dates:  Health Maintenance  Topic Date Due   Medicare Annual Wellness Visit  05/16/2023   Flu Shot  08/01/2023   DTaP/Tdap/Td vaccine (2 - Td or Tdap) 08/05/2023   Colon Cancer Screening  10/07/2023   Mammogram  08/22/2024   Pneumonia Vaccine  Completed   DEXA scan (bone density measurement)  Completed   Hepatitis C Screening: USPSTF Recommendation to screen - Ages 22-79 yo.  Completed   Zoster (Shingles) Vaccine  Completed   HPV Vaccine  Aged Out   COVID-19 Vaccine  Discontinued    Advanced directives: No  Conditions/risks identified: Yes  Next appointment: Follow up in one year for your annual wellness visit.   Preventive Care 21 Years and Older, Female Preventive care refers to lifestyle choices and visits with your health care provider that can promote health and wellness. What does preventive care include? A yearly physical exam. This is also called an annual well check. Dental exams once or twice a year. Routine eye exams. Ask your health care provider how often you should have your eyes checked. Personal lifestyle choices, including: Daily care of your teeth and gums. Regular physical activity. Eating a healthy diet. Avoiding tobacco and drug use. Limiting alcohol use. Practicing safe sex. Taking low-dose aspirin every day. Taking vitamin and mineral supplements as recommended by your health care provider. What happens during an annual well check? The services and screenings done by your health care provider during your annual well check will depend on your  age, overall health, lifestyle risk factors, and family history of disease. Counseling  Your health care provider may ask you questions about your: Alcohol use. Tobacco use. Drug use. Emotional well-being. Home and relationship well-being. Sexual activity. Eating habits. History of falls. Memory and ability to understand (cognition). Work and work Astronomer. Reproductive health. Screening  You may have the following tests or measurements: Height, weight, and BMI. Blood pressure. Lipid and cholesterol levels. These may be checked every 5 years, or more frequently if you are over 23 years old. Skin check. Lung cancer screening. You may have this screening every year starting at age 72 if you have a 30-pack-year history of smoking and currently smoke or have quit within the past 15 years. Fecal occult blood test (FOBT) of the stool. You may have this test every year starting at age 59. Flexible sigmoidoscopy or colonoscopy. You may have a sigmoidoscopy every 5 years or a colonoscopy every 10 years starting at age 22. Hepatitis C blood test. Hepatitis B blood test. Sexually transmitted disease (STD) testing. Diabetes screening. This is done by checking your blood sugar (glucose) after you have not eaten for a while (fasting). You may have this done every 1-3 years. Bone density scan. This is done to screen for osteoporosis. You may have this done starting at age 53. Mammogram. This may be done every 1-2 years. Talk to your health care provider about how often you should have regular mammograms. Talk with  your health care provider about your test results, treatment options, and if necessary, the need for more tests. Vaccines  Your health care provider may recommend certain vaccines, such as: Influenza vaccine. This is recommended every year. Tetanus, diphtheria, and acellular pertussis (Tdap, Td) vaccine. You may need a Td booster every 10 years. Zoster vaccine. You may need this after  age 79. Pneumococcal 13-valent conjugate (PCV13) vaccine. One dose is recommended after age 28. Pneumococcal polysaccharide (PPSV23) vaccine. One dose is recommended after age 56. Talk to your health care provider about which screenings and vaccines you need and how often you need them. This information is not intended to replace advice given to you by your health care provider. Make sure you discuss any questions you have with your health care provider. Document Released: 01/13/2016 Document Revised: 09/05/2016 Document Reviewed: 10/18/2015 Elsevier Interactive Patient Education  2017 [employer].  Fall Prevention in the Home Falls can cause injuries. They can happen to people of all ages. There are many things you can do to make your home safe and to help prevent falls. What can I do on the outside of my home? Regularly fix the edges of walkways and driveways and fix any cracks. Remove anything that might make you trip as you walk through a door, such as a raised step or threshold. Trim any bushes or trees on the path to your home. Use bright outdoor lighting. Clear any walking paths of anything that might make someone trip, such as rocks or tools. Regularly check to see if handrails are loose or broken. Make sure that both sides of any steps have handrails. Any raised decks and porches should have guardrails on the edges. Have any leaves, snow, or ice cleared regularly. Use sand or salt on walking paths during winter. Clean up any spills in your garage right away. This includes oil or grease spills. What can I do in the bathroom? Use night lights. Install grab bars by the toilet and in the tub and shower. Do not use towel bars as grab bars. Use non-skid mats or decals in the tub or shower. If you need to sit down in the shower, use a plastic, non-slip stool. Keep the floor dry. Clean up any water that spills on the floor as soon as it happens. Remove soap buildup in the tub or shower  regularly. Attach bath mats securely with double-sided non-slip rug tape. Do not have throw rugs and other things on the floor that can make you trip. What can I do in the bedroom? Use night lights. Make sure that you have a light by your bed that is easy to reach. Do not use any sheets or blankets that are too big for your bed. They should not hang down onto the floor. Have a firm chair that has side arms. You can use this for support while you get dressed. Do not have throw rugs and other things on the floor that can make you trip. What can I do in the kitchen? Clean up any spills right away. Avoid walking on wet floors. Keep items that you use a lot in easy-to-reach places. If you need to reach something above you, use a strong step stool that has a grab bar. Keep electrical cords out of the way. Do not use floor polish or wax that makes floors slippery. If you must use wax, use non-skid floor wax. Do not have throw rugs and other things on the floor that can make you trip.  What can I do with my stairs? Do not leave any items on the stairs. Make sure that there are handrails on both sides of the stairs and use them. Fix handrails that are broken or loose. Make sure that handrails are as long as the stairways. Check any carpeting to make sure that it is firmly attached to the stairs. Fix any carpet that is loose or worn. Avoid having throw rugs at the top or bottom of the stairs. If you do have throw rugs, attach them to the floor with carpet tape. Make sure that you have a light switch at the top of the stairs and the bottom of the stairs. If you do not have them, ask someone to add them for you. What else can I do to help prevent falls? Wear shoes that: Do not have high heels. Have rubber bottoms. Are comfortable and fit you well. Are closed at the toe. Do not wear sandals. If you use a stepladder: Make sure that it is fully opened. Do not climb a closed stepladder. Make sure that  both sides of the stepladder are locked into place. Ask someone to hold it for you, if possible. Clearly mark and make sure that you can see: Any grab bars or handrails. First and last steps. Where the edge of each step is. Use tools that help you move around (mobility aids) if they are needed. These include: Canes. Walkers. Scooters. Crutches. Turn on the lights when you go into a dark area. Replace any light bulbs as soon as they burn out. Set up your furniture so you have a clear path. Avoid moving your furniture around. If any of your floors are uneven, fix them. If there are any pets around you, be aware of where they are. Review your medicines with your doctor. Some medicines can make you feel dizzy. This can increase your chance of falling. Ask your doctor what other things that you can do to help prevent falls. This information is not intended to replace advice given to you by your health care provider. Make sure you discuss any questions you have with your health care provider. Document Released: 10/13/2009 Document Revised: 05/24/2016 Document Reviewed: 01/21/2015 Elsevier Interactive Patient Education  2017 [employer].

## 2023-05-31 ENCOUNTER — Encounter: Payer: Self-pay | Admitting: Internal Medicine

## 2023-05-31 ENCOUNTER — Ambulatory Visit (INDEPENDENT_AMBULATORY_CARE_PROVIDER_SITE_OTHER): Payer: 59 | Admitting: Internal Medicine

## 2023-05-31 VITALS — BP 120/80 | HR 65 | Temp 98.5°F | Ht 64.0 in | Wt 220.0 lb

## 2023-05-31 DIAGNOSIS — K2970 Gastritis, unspecified, without bleeding: Secondary | ICD-10-CM | POA: Diagnosis not present

## 2023-05-31 DIAGNOSIS — I10 Essential (primary) hypertension: Secondary | ICD-10-CM | POA: Diagnosis not present

## 2023-05-31 DIAGNOSIS — E559 Vitamin D deficiency, unspecified: Secondary | ICD-10-CM | POA: Diagnosis not present

## 2023-05-31 DIAGNOSIS — Z860100 Personal history of colon polyps, unspecified: Secondary | ICD-10-CM

## 2023-05-31 DIAGNOSIS — A048 Other specified bacterial intestinal infections: Secondary | ICD-10-CM | POA: Diagnosis not present

## 2023-05-31 DIAGNOSIS — K5900 Constipation, unspecified: Secondary | ICD-10-CM

## 2023-05-31 DIAGNOSIS — K635 Polyp of colon: Secondary | ICD-10-CM | POA: Diagnosis not present

## 2023-05-31 DIAGNOSIS — K219 Gastro-esophageal reflux disease without esophagitis: Secondary | ICD-10-CM

## 2023-05-31 NOTE — Patient Instructions (Signed)
Ok to try the OTC miralax for probable constipation  Please continue all other medications as before, and refills have been done if requested.  Please have the pharmacy call with any other refills you may need.  Please keep your appointments with your specialists as you may have planned  Please go to the LAB at the blood drawing area for the tests to be done - the stool test for h pylori  You will be contacted regarding the referral for: colonoscopy with Dr Lavon Paganini

## 2023-05-31 NOTE — Progress Notes (Unsigned)
Patient ID: Tina Stone, female   DOB: 1950-02-07, 73 y.o.   MRN: [id number]        Chief Complaint: follow up constipation, gastritis, hx colon polyp,  gerd, htn, low vit d        HPI:  Tina Stone is a 73 y.o. female here with c/o mild intemittent upper abd pains, dull without n/v, but has intermittent constipation as well. Does have recent hx of h pylori gastritis now wondering if still resolved  Also has hx of colon polyp and due for colonoscopy with Dr Lavon Paganini.  Denies worsening reflux, other abd pain, dysphagia, n/v, diarrhea or blood.Pt denies chest pain, increased sob or doe, wheezing, orthopnea, PND, increased LE swelling, palpitations, dizziness or syncope.   Pt denies polydipsia, polyuria, or new focal neuro s/s.    Pt denies fever, wt loss, night sweats, loss of appetite, or other constitutional symptoms         Wt Readings from Last 3 Encounters:  05/31/23 220 lb (99.8 kg)  05/20/23 221 lb 6.4 oz (100.4 kg)  05/02/23 219 lb (99.3 kg)   BP Readings from Last 3 Encounters:  05/31/23 120/80  05/20/23 122/60  05/02/23 130/78         Past Medical History:  Diagnosis Date   Adenomatous colon polyp    Allergic rhinitis, seasonal    Allergy    Atrial fibrillation (HCC)    Beta thalassemia trait 11/25/2013   Cholelithiasis    Class 3 obesity without serious comorbidity with body mass index (BMI) of 40.0 to 44.9 in adult 11/19/2012   endometrial ca dx'd 08/2009   endometrial    GERD (gastroesophageal reflux disease)    H. pylori infection    History of radiation therapy    endometrial - 02/19/2022-03/23/2022 Dr Antony Blackbird   HLD (hyperlipidemia)    Hypercholesterolemia    Hypertension 03/18/2017   no meds    Hypothyroidism    Intraductal papilloma of left breast    Patient underwent left needle-localized lumpectomy by Dr. Wilmon Arms. Tsuei on 09/09/2013; pathology showed intraductal papilloma with no atypia or malignancy identified.   PONV (postoperative nausea and  vomiting)    Pre-diabetes    pt denies   Urticaria    Uterine fibroid    Past Surgical History:  Procedure Laterality Date   ANKLE FRACTURE SURGERY Right 1966   right   BREAST EXCISIONAL BIOPSY Left 2014   benign   BREAST LUMPECTOMY WITH NEEDLE LOCALIZATION Left 09/09/2013   Procedure: BREAST LUMPECTOMY WITH NEEDLE LOCALIZATION;  Surgeon: Wilmon Arms. Corliss Skains, MD;  Location: [facility] OR;  Service: General;  Laterality: Left;   CHOLECYSTECTOMY     COLONOSCOPY     IR IMAGING GUIDED PORT INSERTION  05/18/2020   IR REMOVAL TUN ACCESS W/ PORT W/O FL MOD SED  01/02/2023   POLYPECTOMY     ROBOTIC ASSISTED LAPAROSCOPIC CHOLECYSTECTOMY  09/09/2019   ROBOTIC ASSISTED TOTAL HYSTERECTOMY WITH BILATERAL SALPINGO OOPHERECTOMY N/A 10/22/2019   Procedure: XI ROBOTIC ASSISTED TOTAL HYSTERECTOMY WITH BILATERAL SALPINGO OOPHORECTOMY GREATER THAN 250 GRAMS, MINI LAPARTOMY FOR SPECIMEN DELIVERY; PELVIC AND PERI-AORTIC LYMPHADENECTOMY;  Surgeon: Adolphus Birchwood, MD;  Location: [facility];  Service: Gynecology;  Laterality: N/A;   SENTINEL NODE BIOPSY N/A 10/22/2019   Procedure: SENTINEL NODE BIOPSY;  Surgeon: Adolphus Birchwood, MD;  Location: [facility];  Service: Gynecology;  Laterality: N/A;    reports that she has never smoked. She has never used smokeless tobacco. She reports that she does not  drink alcohol and does not use drugs. family history includes Asthma in her brother and paternal grandmother; Colon polyps (age of onset: 75) in her mother; Congestive Heart Failure in her father; Dementia (age of onset: 52) in her mother; Diabetes in her brother, father, maternal grandmother, mother, and paternal grandfather; Hypertension in her brother, brother, maternal grandmother, and mother; Lung disease in her paternal grandmother; Pancreatic cancer in her paternal aunt; Uterine cancer in her paternal aunt. Allergies  Allergen Reactions   Benicar Hct [Olmesartan Medoxomil-Hctz] Shortness Of Breath and Palpitations   Iodinated Contrast  Media Hives and Itching    02/02/2021-  pt developed 2 hives and itching even with the 13hr premedication.  Radiology PA came into eval the pt.  Developed itching and hives after injection on 05/10/20; needs 13hr prep in future   Crestor [Rosuvastatin] Other (See Comments)    Muscle aches   Bactrim [Sulfamethoxazole-Trimethoprim] Other (See Comments)    Abdominal pain, dizziness   Pravachol [Pravastatin] Other (See Comments)    Lower abdominal pain   Shellfish Allergy Nausea And Vomiting   Amlodipine Palpitations   Current Outpatient Medications on File Prior to Visit  Medication Sig Dispense Refill   acetaminophen (TYLENOL) 500 MG tablet Take 1,000 mg by mouth every 6 (six) hours as needed for moderate pain.     APPLE CIDER VINEGAR PO Take 5 mLs by mouth daily.      atenolol (TENORMIN) 25 MG tablet TAKE 1 TABLET (25 MG TOTAL) BY MOUTH DAILY. 90 tablet 0   Cholecalciferol (VITAMIN D3) 50 MCG (2000 UT) TABS Take by mouth.     ELIQUIS 5 MG TABS tablet TAKE 1 TABLET BY MOUTH TWICE A DAY 180 tablet 0   Evolocumab (REPATHA SURECLICK) 140 MG/ML SOAJ Inject 140 mg into the skin every 14 (fourteen) days. 2 mL 11   Multiple Vitamin (MULTIVITAMIN) capsule Take 1 capsule by mouth daily.     UNITHROID 75 MCG tablet Take 1 tablet (75 mcg total) by mouth daily before breakfast. 90 tablet 0   No current facility-administered medications on file prior to visit.        ROS:  All others reviewed and negative.  Objective        PE:  BP 120/80 (BP Location: Right Arm, Patient Position: Sitting, Cuff Size: Normal)   Pulse 65   Temp 98.5 F (36.9 C) (Oral)   Ht 5\' 4"  (1.626 m)   Wt 220 lb (99.8 kg)   SpO2 99%   BMI 37.76 kg/m                 Constitutional: Pt appears in NAD               HENT: Head: NCAT.                Right Ear: External ear normal.                 Left Ear: External ear normal.                Eyes: . Pupils are equal, round, and reactive to light. Conjunctivae and EOM are  normal               Nose: without d/c or deformity               Neck: Neck supple. Gross normal ROM               Cardiovascular: Normal rate and regular  rhythm.                 Pulmonary/Chest: Effort normal and breath sounds without rales or wheezing.                Abd:  Soft, NT, ND, + BS, no organomegaly               Neurological: Pt is alert. At baseline orientation, motor grossly intact               Skin: Skin is warm. No rashes, no other new lesions, LE edema - none               Psychiatric: Pt behavior is normal without agitation   Micro: none  Cardiac tracings I have personally interpreted today:  none  Pertinent Radiological findings (summarize): none   Lab Results  Component Value Date   WBC 4.6 04/01/2023   HGB 12.8 04/01/2023   HCT 39.4 04/01/2023   PLT 212.0 04/01/2023   GLUCOSE 89 04/01/2023   CHOL 219 (H) 10/17/2022   TRIG 55 10/17/2022   HDL 84 10/17/2022   LDLCALC 125 (H) 10/17/2022   ALT 10 04/01/2023   AST 17 04/01/2023   NA 136 04/01/2023   K 3.9 04/01/2023   CL 102 04/01/2023   CREATININE 0.91 04/01/2023   BUN 11 04/01/2023   CO2 30 04/01/2023   TSH 1.66 01/09/2023   INR 1.0 01/28/2021   HGBA1C 5.3 02/02/2022   Assessment/Plan:  Tina Stone is a 73 y.o. Black or African American [2] female with  has a past medical history of Adenomatous colon polyp, Allergic rhinitis, seasonal, Allergy, Atrial fibrillation (HCC), Beta thalassemia trait (11/25/2013), Cholelithiasis, Class 3 obesity without serious comorbidity with body mass index (BMI) of 40.0 to 44.9 in adult (11/19/2012), endometrial ca (dx'd 08/2009), GERD (gastroesophageal reflux disease), H. pylori infection, History of radiation therapy, HLD (hyperlipidemia), Hypercholesterolemia, Hypertension (03/18/2017), Hypothyroidism, Intraductal papilloma of left breast, PONV (postoperative nausea and vomiting), Pre-diabetes, Urticaria, and Uterine fibroid.  Gastroesophageal reflux disease Stable,  declines need for PPI or other at this time  Vitamin D deficiency Last vitamin D Lab Results  Component Value Date   VD25OH 42.24 04/09/2022   Stable, cont oral replacement   Essential hypertension BP Readings from Last 3 Encounters:  05/31/23 120/80  05/20/23 122/60  05/02/23 130/78   Stable, pt to continue medical treatment tenormin 25 qd   Polyp of colon Due for colonoscopy with Dr Lavon Paganini  Gastritis Has hx of h pylori - for stool Ag testing to prove eradicaiton  Constipation Mild intermittent, for miralax 17 mg po qd prn  Followup: Return if symptoms worsen or fail to improve.  Oliver Barre, MD 06/02/2023 12:50 PM [facility]

## 2023-06-02 ENCOUNTER — Encounter: Payer: Self-pay | Admitting: Internal Medicine

## 2023-06-02 DIAGNOSIS — K5900 Constipation, unspecified: Secondary | ICD-10-CM | POA: Insufficient documentation

## 2023-06-02 DIAGNOSIS — K2970 Gastritis, unspecified, without bleeding: Secondary | ICD-10-CM | POA: Insufficient documentation

## 2023-06-02 NOTE — Assessment & Plan Note (Signed)
Due for colonoscopy with Dr Lavon Paganini

## 2023-06-02 NOTE — Assessment & Plan Note (Signed)
BP Readings from Last 3 Encounters:  05/31/23 120/80  05/20/23 122/60  05/02/23 130/78   Stable, pt to continue medical treatment tenormin 25 qd

## 2023-06-02 NOTE — Assessment & Plan Note (Signed)
Stable, declines need for PPI or other at this time

## 2023-06-02 NOTE — Assessment & Plan Note (Signed)
Mild intermittent, for miralax 17 mg po qd prn

## 2023-06-02 NOTE — Assessment & Plan Note (Signed)
Has hx of h pylori - for stool Ag testing to prove eradicaiton

## 2023-06-02 NOTE — Assessment & Plan Note (Signed)
Last vitamin D Lab Results  Component Value Date   VD25OH 42.24 04/09/2022   Stable, cont oral replacement  

## 2023-06-03 DIAGNOSIS — A048 Other specified bacterial intestinal infections: Secondary | ICD-10-CM | POA: Diagnosis not present

## 2023-06-04 LAB — HELICOBACTER PYLORI  SPECIAL ANTIGEN

## 2023-06-28 ENCOUNTER — Encounter: Payer: Self-pay | Admitting: Family Medicine

## 2023-06-28 ENCOUNTER — Ambulatory Visit (INDEPENDENT_AMBULATORY_CARE_PROVIDER_SITE_OTHER): Payer: 59 | Admitting: Family Medicine

## 2023-06-28 VITALS — BP 140/80 | HR 67 | Temp 98.0°F | Ht 64.0 in | Wt 220.6 lb

## 2023-06-28 DIAGNOSIS — C541 Malignant neoplasm of endometrium: Secondary | ICD-10-CM

## 2023-06-28 DIAGNOSIS — M25551 Pain in right hip: Secondary | ICD-10-CM | POA: Diagnosis not present

## 2023-06-28 DIAGNOSIS — Z7901 Long term (current) use of anticoagulants: Secondary | ICD-10-CM

## 2023-06-28 DIAGNOSIS — M4726 Other spondylosis with radiculopathy, lumbar region: Secondary | ICD-10-CM | POA: Diagnosis not present

## 2023-06-28 DIAGNOSIS — M5432 Sciatica, left side: Secondary | ICD-10-CM | POA: Diagnosis not present

## 2023-06-28 NOTE — Progress Notes (Signed)
Subjective  CC:  Chief Complaint  Patient presents with   Leg Pain    Pt stated that has has been having some pain in both legs for the past 2 weeks. Pt stated that she does work out quite a bit but not sure if it is related. Pt stated that when she is sitting she has to keep moving around in different positions to help with the pain.    Same day acute visit; PCP not available. New pt to me. Chart reviewed.    HPI: Tina Stone is a 73 y.o. female who presents to the office today to address the problems listed above in the chief complaint. 73 year old female with history of endometrial cancer, lumbar DJD, right hip DJD on chronic anticoagulation due to paroxysmal A-fib presents with 1 to 2-week history of hip and leg pain.  She is a vague historian but it sounds like she has had some left low back pain that radiates down her leg to around her knee intermittently.  Worse with walking and sitting.  Sometimes hurts while driving.  No weakness in the lower extremity.  She feels best if she is lying at night.  Pain comes and goes.  She does not use any medication for the pain.  She describes it as discomfort.  No bowel or bladder problems. Also describes some right hip pain, more in the anterior groin region.  She has known right hip arthritis.  I reviewed lumbar x-rays, right hip x-rays and recent abdominal pelvic CT for endometrial cancer surveillance.  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  Assessment  1. Left sided sciatica   2. Osteoarthritis of spine with radiculopathy, lumbar region   3. Right hip pain   4. Endometrial cancer (HCC)   5. Chronic anticoagulation      Plan  Left sciatica/radiculopathy: Education given.  Patient is comfortable now.  Pain has been mild.  No emergent symptoms present.  Benign exam.  Recommend Tylenol per patient's preference.  If that is not helpful, she can try Advil.  We discussed other treatment options if pain worsens.   She will let us know.  Discussed emergent symptoms. Right hip pain likely due to arthritis.  Tylenol. Discussed risks of anti-inflammatories in the setting of chronic anticoagulation.  Follow up: As needed Visit date not found  No orders of the defined types were placed in this encounter.  No orders of the defined types were placed in this encounter.     I reviewed the patients updated PMH, FH, and SocHx.    Patient Active Problem List   Diagnosis Date Noted   Gastritis 06/02/2023   Constipation 06/02/2023   Abdominal pain 04/01/2023   Congestion of nasal sinus 02/01/2023   Polyp of colon 01/09/2023   Benign liver cyst 12/21/2022   Right hip pain 08/28/2022   Aortic atherosclerosis (HCC) 08/01/2022   Chronic idiopathic urticaria 11/22/2021   Chronic anticoagulation 10/02/2021   Visit for screening mammogram 08/18/2021   Acquired hypothyroidism 08/18/2021   Allergic rhinoconjunctivitis 08/18/2021   Encounter for general adult medical examination with abnormal findings 08/18/2021   Hyperlipidemia with target LDL less than 100 08/17/2021   Laryngitis, chronic 08/17/2021   Paroxysmal atrial fibrillation (HCC) 02/03/2021   Secondary hypercoagulable state (HCC) 02/03/2021   Essential hypertension 11/16/2020   Vitamin D deficiency 10/27/2020   Endometrial cancer (HCC) 10/22/2019   Upper respiratory infection, viral 04/01/2018   Acute cough 02/02/2014   Beta  thalassemia trait 11/25/2013   Class II obesity 11/19/2012   Gastroesophageal reflux disease 01/28/2007   No outpatient medications have been marked as taking for the 06/28/23 encounter (Office Visit) with Willow Ora, MD.    Allergies: Patient is allergic to benicar hct [olmesartan medoxomil-hctz], iodinated contrast media, crestor [rosuvastatin], bactrim [sulfamethoxazole-trimethoprim], pravachol [pravastatin], shellfish allergy, and amlodipine. Family History: Patient family history includes Asthma in her brother  and paternal grandmother; Colon polyps (age of onset: 34) in her mother; Congestive Heart Failure in her father; Dementia (age of onset: 85) in her mother; Diabetes in her brother, father, maternal grandmother, mother, and paternal grandfather; Hypertension in her brother, brother, maternal grandmother, and mother; Lung disease in her paternal grandmother; Pancreatic cancer in her paternal aunt; Uterine cancer in her paternal aunt. Social History:  Patient  reports that she has never smoked. She has never used smokeless tobacco. She reports that she does not drink alcohol and does not use drugs.  Review of Systems: Constitutional: Negative for fever malaise or anorexia Cardiovascular: negative for chest pain Respiratory: negative for SOB or persistent cough Gastrointestinal: negative for abdominal pain  Objective  Vitals: BP (!) 140/80   Pulse 67   Temp 98 F (36.7 C)   Ht 5\' 4"  (1.626 m)   Wt 220 lb 9.6 oz (100.1 kg)   SpO2 97%   BMI 37.87 kg/m  General: no acute distress , A&Ox3, moves to exam table easily Back: No spinal tenderness, no sciatic notch tenderness, Hips: No groin tenderness or greater trochanteric bursa tenderness Negative straight leg raise bilaterally +2 DTRs throughout lower extremities bilaterally  Commons side effects, risks, benefits, and alternatives for medications and treatment plan prescribed today were discussed, and the patient expressed understanding of the given instructions. Patient is instructed to call or message via MyChart if he/she has any questions or concerns regarding our treatment plan. No barriers to understanding were identified. We discussed Red Flag symptoms and signs in detail. Patient expressed understanding regarding what to do in case of urgent or emergency type symptoms.  Medication list was reconciled, printed and provided to the patient in AVS. Patient instructions and summary information was reviewed with the patient as documented in the  AVS. This note was prepared with assistance of Dragon voice recognition software. Occasional wrong-word or sound-a-like substitutions may have occurred due to the inherent limitations of voice recognition software

## 2023-07-11 ENCOUNTER — Other Ambulatory Visit: Payer: Self-pay | Admitting: Internal Medicine

## 2023-07-11 DIAGNOSIS — E039 Hypothyroidism, unspecified: Secondary | ICD-10-CM

## 2023-07-19 ENCOUNTER — Other Ambulatory Visit: Payer: Self-pay | Admitting: Internal Medicine

## 2023-07-19 DIAGNOSIS — D6869 Other thrombophilia: Secondary | ICD-10-CM

## 2023-07-19 DIAGNOSIS — I480 Paroxysmal atrial fibrillation: Secondary | ICD-10-CM

## 2023-07-23 ENCOUNTER — Other Ambulatory Visit: Payer: Self-pay | Admitting: Internal Medicine

## 2023-07-23 DIAGNOSIS — I480 Paroxysmal atrial fibrillation: Secondary | ICD-10-CM

## 2023-07-23 DIAGNOSIS — D6869 Other thrombophilia: Secondary | ICD-10-CM

## 2023-07-23 DIAGNOSIS — I10 Essential (primary) hypertension: Secondary | ICD-10-CM

## 2023-08-08 ENCOUNTER — Telehealth: Payer: Self-pay

## 2023-08-08 NOTE — Telephone Encounter (Signed)
-----   Message from Tina Stone sent at 08/08/2023  1:52 PM EDT ----- Call her to schedule CT for 9/20 and move her labs before CT

## 2023-08-08 NOTE — Telephone Encounter (Signed)
Called and left radiology scheduling # and ask her to schedule CT on 9/20 after 0730 lab appt. Ask her to call the office with questions.

## 2023-08-09 ENCOUNTER — Ambulatory Visit (INDEPENDENT_AMBULATORY_CARE_PROVIDER_SITE_OTHER): Payer: 59 | Admitting: Nurse Practitioner

## 2023-08-09 ENCOUNTER — Other Ambulatory Visit: Payer: Self-pay | Admitting: Nurse Practitioner

## 2023-08-09 ENCOUNTER — Ambulatory Visit (INDEPENDENT_AMBULATORY_CARE_PROVIDER_SITE_OTHER): Payer: 59

## 2023-08-09 VITALS — BP 128/70 | HR 60 | Temp 98.3°F | Ht 64.0 in | Wt 224.1 lb

## 2023-08-09 DIAGNOSIS — E871 Hypo-osmolality and hyponatremia: Secondary | ICD-10-CM

## 2023-08-09 DIAGNOSIS — M549 Dorsalgia, unspecified: Secondary | ICD-10-CM

## 2023-08-09 DIAGNOSIS — I700 Atherosclerosis of aorta: Secondary | ICD-10-CM | POA: Diagnosis not present

## 2023-08-09 DIAGNOSIS — M545 Low back pain, unspecified: Secondary | ICD-10-CM | POA: Diagnosis not present

## 2023-08-09 DIAGNOSIS — E039 Hypothyroidism, unspecified: Secondary | ICD-10-CM

## 2023-08-09 DIAGNOSIS — M47814 Spondylosis without myelopathy or radiculopathy, thoracic region: Secondary | ICD-10-CM | POA: Diagnosis not present

## 2023-08-09 DIAGNOSIS — R319 Hematuria, unspecified: Secondary | ICD-10-CM | POA: Insufficient documentation

## 2023-08-09 DIAGNOSIS — M47816 Spondylosis without myelopathy or radiculopathy, lumbar region: Secondary | ICD-10-CM | POA: Diagnosis not present

## 2023-08-09 LAB — POC URINALSYSI DIPSTICK (AUTOMATED)
Bilirubin, UA: NEGATIVE
Glucose, UA: NEGATIVE
Ketones, UA: NEGATIVE
Leukocytes, UA: NEGATIVE
Nitrite, UA: NEGATIVE
Protein, UA: NEGATIVE
Spec Grav, UA: 1.015 (ref 1.010–1.025)
Urobilinogen, UA: 0.2 E.U./dL
pH, UA: 7.5 (ref 5.0–8.0)

## 2023-08-09 LAB — COMPREHENSIVE METABOLIC PANEL
ALT: 10 U/L (ref 0–35)
AST: 16 U/L (ref 0–37)
Albumin: 3.9 g/dL (ref 3.5–5.2)
Alkaline Phosphatase: 92 U/L (ref 39–117)
BUN: 10 mg/dL (ref 6–23)
CO2: 30 mEq/L (ref 19–32)
Calcium: 9.3 mg/dL (ref 8.4–10.5)
Chloride: 99 mEq/L (ref 96–112)
Creatinine, Ser: 0.9 mg/dL (ref 0.40–1.20)
GFR: 64.71 mL/min (ref >60.00)
Glucose, Bld: 89 mg/dL (ref 70–99)
Potassium: 3.7 mEq/L (ref 3.5–5.1)
Sodium: 130 mEq/L — ABNORMAL LOW (ref 135–145)
Total Bilirubin: 0.6 mg/dL (ref 0.2–1.2)
Total Protein: 7.1 g/dL (ref 6.0–8.3)

## 2023-08-09 LAB — POCT INFLUENZA A/B
Influenza A, POC: NEGATIVE
Influenza B, POC: NEGATIVE

## 2023-08-09 LAB — POCT RESPIRATORY SYNCYTIAL VIRUS: RSV Rapid Ag: NEGATIVE

## 2023-08-09 LAB — POC COVID19 BINAXNOW: SARS Coronavirus 2 Ag: NEGATIVE

## 2023-08-09 LAB — TSH: TSH: 1.19 u[IU]/mL (ref 0.35–5.50)

## 2023-08-09 NOTE — Assessment & Plan Note (Signed)
Chronic, incidental finding on urinalysis today.  Patient has been evaluated for this in January 2024 with urology.  Will send urine out for urine culture to rule out infection, if infection is not present encouraged patient to follow-up with urology as scheduled/as needed.

## 2023-08-09 NOTE — Patient Instructions (Addendum)
Call office if you experience worsening back pain, sensory changes of arms/legs (numbness/tingling), weakness of arms/legs, or inability to control your bowels or bladder.

## 2023-08-09 NOTE — Assessment & Plan Note (Addendum)
Acute Point-of-care COVID, RSV, flu: negative Will check urine for evidence of UTI Will check TSH and CMP, further recommendations may be made based upon these results.  In the meantime patient to continue taking her unit thyroid 75 mcg daily. Due to history of cancer and new back pain will check x-ray of thoracic and lumbar spine for further evaluation. In the meantime patient was encouraged to rest and practice gentle stretching as well as ambulation as tolerated. Patient to call office if symptoms worsen especially if she experiences weakness, sensory changes, inability to control bowels or bladder.  Patient reports understanding.

## 2023-08-09 NOTE — Progress Notes (Signed)
Established Patient Office Visit  Subjective   Patient ID: Tina Stone, female    DOB: 03-05-1950  Age: 73 y.o. MRN: [id number]  Chief Complaint  Patient presents with   Back Pain    Patient arrives for acute visit for the above.  Reports symptom onset 1 week ago.  She has mid to low back pain.  1-2/10 in intensity.  Movement worsens it, stretching improves it.  Denies any physical trauma preceding back pain.  Has not felt the need to take any over-the-counter medications.  Denies any new paresthesias, extremity weakness, urinary or bowel incontinence.  She is concerned she may have COVID as she is her back pain can be a cause of this.  Would like to be tested denies any coughing or fever.  Also reports feeling more anxious recently and would like her thyroid checked.  Does have a history of uterine cancer and per her report with metastasis to liver.  She would like her liver function testing completed today.     ROS: see HPI    Objective:     BP 128/70   Pulse 60   Temp 98.3 F (36.8 C) (Temporal)   Ht 5\' 4"  (1.626 m)   Wt 224 lb 2 oz (101.7 kg)   SpO2 100%   BMI 38.47 kg/m    Physical Exam Vitals reviewed.  Constitutional:      General: She is not in acute distress.    Appearance: Normal appearance.  HENT:     Head: Normocephalic and atraumatic.  Neck:     Vascular: No carotid bruit.  Cardiovascular:     Rate and Rhythm: Normal rate and regular rhythm.     Pulses: Normal pulses.     Heart sounds: Normal heart sounds.  Pulmonary:     Effort: Pulmonary effort is normal.     Breath sounds: Normal breath sounds.  Abdominal:     Tenderness: There is no right CVA tenderness or left CVA tenderness.  Musculoskeletal:     Cervical back: Normal.     Thoracic back: Normal. No deformity, tenderness or bony tenderness. Normal range of motion.     Lumbar back: Normal. No tenderness or bony tenderness. Normal range of motion.  Skin:    General: Skin is warm and dry.   Neurological:     General: No focal deficit present.     Mental Status: She is alert and oriented to person, place, and time.  Psychiatric:        Mood and Affect: Mood normal.        Behavior: Behavior normal.        Judgment: Judgment normal.      Results for orders placed or performed in visit on 08/09/23  POC COVID-19  Result Value Ref Range   SARS Coronavirus 2 Ag Negative Negative  POCT respiratory syncytial virus  Result Value Ref Range   RSV Rapid Ag negative   POCT Urinalysis Dipstick (Automated)  Result Value Ref Range   Color, UA     Clarity, UA     Glucose, UA Negative Negative   Bilirubin, UA negative    Ketones, UA negative    Spec Grav, UA 1.015 1.010 - 1.025   Blood, UA trace 2+    pH, UA 7.5 5.0 - 8.0   Protein, UA Negative Negative   Urobilinogen, UA 0.2 0.2 or 1.0 E.U./dL   Nitrite, UA negative    Leukocytes, UA Negative Negative  POCT Influenza A/B  Result Value Ref Range   Influenza A, POC Negative Negative   Influenza B, POC Negative Negative      The 10-year ASCVD risk score (Arnett DK, et al., 2019) is: 12.4%    Assessment & Plan:   Problem List Items Addressed This Visit       Endocrine   Acquired hypothyroidism    Labs ordered, further recommendations may be made based upon his results For now patient continue on unit thyroid 75 mcg daily.      Relevant Orders   TSH     Other   Acute bilateral back pain - Primary    Acute Point-of-care COVID, RSV, flu: negative Will check urine for evidence of UTI Will check TSH and CMP, further recommendations may be made based upon these results.  In the meantime patient to continue taking her unit thyroid 75 mcg daily. Due to history of cancer and new back pain will check x-ray of thoracic and lumbar spine for further evaluation. In the meantime patient was encouraged to rest and practice gentle stretching as well as ambulation as tolerated. Patient to call office if symptoms worsen  especially if she experiences weakness, sensory changes, inability to control bowels or bladder.  Patient reports understanding.      Relevant Orders   POC COVID-19 (Completed)   Urine Culture   Comprehensive metabolic panel   DG Thoracic Spine 2 View   DG Lumbar Spine Complete   POCT respiratory syncytial virus (Completed)   POCT Urinalysis Dipstick (Automated) (Completed)   POCT Influenza A/B (Completed)   Hematuria    Chronic, incidental finding on urinalysis today.  Patient has been evaluated for this in January 2024 with urology.  Will send urine out for urine culture to rule out infection, if infection is not present encouraged patient to follow-up with urology as scheduled/as needed.       Return if symptoms worsen or fail to improve.    Elenore Paddy, NP

## 2023-08-09 NOTE — Assessment & Plan Note (Signed)
Labs ordered, further recommendations may be made based upon his results For now patient continue on unit thyroid 75 mcg daily.

## 2023-08-12 NOTE — Progress Notes (Unsigned)
Cardiology Clinic Note   Patient Name: Tina Stone Date of Encounter: 08/13/2023  Primary Care Provider:  Etta Grandchild, MD Primary Cardiologist: Dr. Rennis Golden   Patient Profile    73 year old female with history of dyslipidemia, atrial fibrillation, hypertension, and aortic atherosclerosis.  Formally followed by Dr. Verdis Prime III until his retirement in 2024.  Is also followed by Dr. Rennis Golden in the lipid clinic and has agreed to be her primary cardiologist.  She was seen last in March 2024 and is due for repeat lipids and LFTs this office visit.  She remains on Repatha.  Past Medical History    Past Medical History:  Diagnosis Date   Adenomatous colon polyp    Allergic rhinitis, seasonal    Allergy    Atrial fibrillation (HCC)    Beta thalassemia trait 11/25/2013   Cholelithiasis    Class 3 obesity without serious comorbidity with body mass index (BMI) of 40.0 to 44.9 in adult 11/19/2012   endometrial ca dx'd 08/2009   endometrial    GERD (gastroesophageal reflux disease)    H. pylori infection    History of radiation therapy    endometrial - 02/19/2022-03/23/2022 Dr Antony Blackbird   HLD (hyperlipidemia)    Hypercholesterolemia    Hypertension 03/18/2017   no meds    Hypothyroidism    Intraductal papilloma of left breast    Patient underwent left needle-localized lumpectomy by Dr. Wilmon Arms. Tsuei on 09/09/2013; pathology showed intraductal papilloma with no atypia or malignancy identified.   PONV (postoperative nausea and vomiting)    Pre-diabetes    pt denies   Urticaria    Uterine fibroid    Past Surgical History:  Procedure Laterality Date   ANKLE FRACTURE SURGERY Right 1966   right   BREAST EXCISIONAL BIOPSY Left 2014   benign   BREAST LUMPECTOMY WITH NEEDLE LOCALIZATION Left 09/09/2013   Procedure: BREAST LUMPECTOMY WITH NEEDLE LOCALIZATION;  Surgeon: Wilmon Arms. Corliss Skains, MD;  Location: [facility];  Service: General;  Laterality: Left;   CHOLECYSTECTOMY      COLONOSCOPY     IR IMAGING GUIDED PORT INSERTION  05/18/2020   IR REMOVAL TUN ACCESS W/ PORT W/O FL MOD SED  01/02/2023   POLYPECTOMY     ROBOTIC ASSISTED LAPAROSCOPIC CHOLECYSTECTOMY  09/09/2019   ROBOTIC ASSISTED TOTAL HYSTERECTOMY WITH BILATERAL SALPINGO OOPHERECTOMY N/A 10/22/2019   Procedure: XI ROBOTIC ASSISTED TOTAL HYSTERECTOMY WITH BILATERAL SALPINGO OOPHORECTOMY GREATER THAN 250 GRAMS, MINI LAPARTOMY FOR SPECIMEN DELIVERY; PELVIC AND PERI-AORTIC LYMPHADENECTOMY;  Surgeon: Adolphus Birchwood, MD;  Location: [facility];  Service: Gynecology;  Laterality: N/A;   SENTINEL NODE BIOPSY N/A 10/22/2019   Procedure: SENTINEL NODE BIOPSY;  Surgeon: Adolphus Birchwood, MD;  Location: [facility];  Service: Gynecology;  Laterality: N/A;    Allergies  Allergies  Allergen Reactions   Benicar Hct [Olmesartan Medoxomil-Hctz] Shortness Of Breath and Palpitations   Iodinated Contrast Media Hives and Itching    02/02/2021-  pt developed 2 hives and itching even with the 13hr premedication.  Radiology PA came into eval the pt.  Developed itching and hives after injection on 05/10/20; needs 13hr prep in future   Crestor [Rosuvastatin] Other (See Comments)    Muscle aches   Bactrim [Sulfamethoxazole-Trimethoprim] Other (See Comments)    Abdominal pain, dizziness   Pravachol [Pravastatin] Other (See Comments)    Lower abdominal pain   Shellfish Allergy Nausea And Vomiting   Amlodipine Palpitations    History of Present Illness    Tina Stone  returns today for ongoing assessment and management of hypertension, atrial fibrillation, CHA2DS2-VASc score 3 (Female, age, HTN) remains on Eliquis,. and hyperlipidemia on Repatha.  She comes today without any cardiac complaints.  She denies any rapid heart rate, palpitations, or significant fatigue.  Her main complaint is obesity.  She is trying to lose weight but is having a difficult time of it.  Home Medications    Current Outpatient Medications  Medication Sig Dispense Refill    acetaminophen (TYLENOL) 500 MG tablet Take 1,000 mg by mouth every 6 (six) hours as needed for moderate pain.     APPLE CIDER VINEGAR PO Take 5 mLs by mouth daily.      atenolol (TENORMIN) 25 MG tablet TAKE 1 TABLET (25 MG TOTAL) BY MOUTH DAILY. 90 tablet 0   Cholecalciferol (VITAMIN D3) 50 MCG (2000 UT) TABS Take by mouth.     ELIQUIS 5 MG TABS tablet TAKE 1 TABLET BY MOUTH TWICE A DAY 180 tablet 0   Evolocumab (REPATHA SURECLICK) 140 MG/ML SOAJ Inject 140 mg into the skin every 14 (fourteen) days. 2 mL 11   Multiple Vitamin (MULTIVITAMIN) capsule Take 1 capsule by mouth daily.     UNITHROID 75 MCG tablet Take 1 tablet (75 mcg total) by mouth daily before breakfast. 90 tablet 0   No current facility-administered medications for this visit.     Family History    Family History  Problem Relation Age of Onset   Diabetes Mother    Hypertension Mother    Colon polyps Mother 72   Dementia Mother 36   Diabetes Father    Congestive Heart Failure Father    Asthma Brother    Diabetes Brother    Hypertension Brother    Hypertension Brother    Hypertension Maternal Grandmother    Diabetes Maternal Grandmother    Asthma Paternal Grandmother    Lung disease Paternal Grandmother    Diabetes Paternal Grandfather    Pancreatic cancer Paternal Aunt    Uterine cancer Paternal Aunt    Colon cancer Neg Hx    Breast cancer Neg Hx    Lung cancer Neg Hx    Esophageal cancer Neg Hx    Rectal cancer Neg Hx    Stomach cancer Neg Hx    She indicated that her mother is alive. She indicated that her father is deceased. She indicated that both of her sisters are alive. She indicated that two of her three brothers are alive. She indicated that her maternal grandmother is deceased. She indicated that her maternal grandfather is deceased. She indicated that her paternal grandmother is deceased. She indicated that her paternal grandfather is deceased. She indicated that only one of her two paternal aunts is  alive. She indicated that the status of her neg hx is unknown.  Social History    Social History   Socioeconomic History   Marital status: Widowed    Spouse name: Not on file   Number of children: 0   Years of education: Not on file   Highest education level: Bachelor's degree (e.g., BA, AB, BS)  Occupational History   Occupation: Geographical information systems officer  Tobacco Use   Smoking status: Never   Smokeless tobacco: Never   Tobacco comments:    few puffs but not a true smoker quit many yrs ago  Vaping Use   Vaping status: Never Used  Substance and Sexual Activity   Alcohol use: Never   Drug use: Never   Sexual activity: Not Currently  Other Topics Concern   Not on file  Social History Narrative   ** Merged History Encounter **       Lives in [place], widowed 2003   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         Social Determinants of Health   Financial Resource Strain: Medium Risk (05/20/2023)   Overall Financial Resource Strain (CARDIA)    Difficulty of Paying Living Expenses: Somewhat hard  Food Insecurity: Food Insecurity Present (05/20/2023)   Hunger Vital Sign    Worried About Running Out of Food in the Last Year: Never true    Ran Out of Food in the Last Year: Sometimes true  Transportation Needs: No Transportation Needs (05/20/2023)   PRAPARE - Administrator, Civil Service (Medical): No    Lack of Transportation (Non-Medical): No  Physical Activity: Sufficiently Active (05/20/2023)   Exercise Vital Sign    Days of Exercise per Week: 3 days    Minutes of Exercise per Session: 70 min  Stress: No Stress Concern Present (05/20/2023)   [employer] of Occupational Health - Occupational Stress Questionnaire    Feeling of Stress : Only a little  Social Connections: Moderately Integrated (05/20/2023)   Social Connection and Isolation Panel [NHANES]    Frequency of Communication with Friends and Family: More than three times a week    Frequency of Social  Gatherings with Friends and Family: More than three times a week    Attends Religious Services: More than 4 times per year    Active Member of [employer] or Organizations: Yes    Attends Banker Meetings: More than 4 times per year    Marital Status: Widowed  Intimate Partner Violence: Not At Risk (05/20/2023)   Humiliation, Afraid, Rape, and Kick questionnaire    Fear of Current or Ex-Partner: No    Emotionally Abused: No    Physically Abused: No    Sexually Abused: No     Review of Systems    General:  No chills, fever, night sweats or weight changes.  Cardiovascular:  No chest pain, dyspnea on exertion, edema, orthopnea, palpitations, paroxysmal nocturnal dyspnea. Dermatological: No rash, lesions/masses Respiratory: No cough, dyspnea Urologic: No hematuria, dysuria Abdominal:   No nausea, vomiting, diarrhea, bright red blood per rectum, melena, or hematemesis Neurologic:  No visual changes, wkns, changes in mental status. All other systems reviewed and are otherwise negative except as noted above.       Physical Exam    VS:  BP 134/84 (BP Location: Left Arm, Patient Position: Sitting, Cuff Size: Normal)   Pulse 75   Ht 5\' 4"  (1.626 m)   Wt 226 lb 3.2 oz (102.6 kg)   SpO2 91%   BMI 38.83 kg/m  , BMI Body mass index is 38.83 kg/m. STOP-Bang Score:  3      GEN: Well nourished, well developed, in no acute distress. HEENT: normal. Neck: Supple, no JVD, carotid bruits, or masses. Cardiac: RRR, no murmurs, rubs, or gallops. No clubbing, cyanosis, edema.  Radials/DP/PT 2+ and equal bilaterally.  Respiratory:  Respirations regular and unlabored, clear to auscultation bilaterally. GI: Soft, nontender, nondistended, BS + x 4. MS: no deformity or atrophy. Skin: warm and dry, no rash. Neuro:  Strength and sensation are intact. Psych: Normal affect.      Lab Results  Component Value Date   WBC 4.6 04/01/2023   HGB 12.8 04/01/2023   HCT 39.4 04/01/2023   MCV 78.5  04/01/2023   PLT 212.0 04/01/2023   Lab Results  Component Value Date   CREATININE 0.90 08/09/2023   BUN 10 08/09/2023   NA 130 (L) 08/09/2023   K 3.7 08/09/2023   CL 99 08/09/2023   CO2 30 08/09/2023   Lab Results  Component Value Date   ALT 10 08/09/2023   AST 16 08/09/2023   ALKPHOS 92 08/09/2023   BILITOT 0.6 08/09/2023   Lab Results  Component Value Date   CHOL 219 (H) 10/17/2022   HDL 84 10/17/2022   LDLCALC 125 (H) 10/17/2022   TRIG 55 10/17/2022   CHOLHDL 2.6 10/17/2022    Lab Results  Component Value Date   HGBA1C 5.3 02/02/2022     Review of Prior Studies    Echocardiogram 03/06/2021 1. Left ventricular ejection fraction, by estimation, is 55 to 60%. The  left ventricle has normal function. The left ventricle has no regional  wall motion abnormalities. Left ventricular diastolic parameters are  consistent with Grade I diastolic  dysfunction (impaired relaxation).   2. Right ventricular systolic function is normal. The right ventricular  size is normal. There is normal pulmonary artery systolic pressure.   3. The mitral valve is abnormal. Trivial mitral valve regurgitation.   4. The aortic valve is tricuspid. Aortic valve regurgitation is not  visualized.   5. Pulmonic valve: Mild leaflet calcification - no stenosis.   6. The inferior vena cava is normal in size with <50% respiratory  variability, suggesting right atrial pressure of 8 mmHg.   Assessment & Plan   PAF: Currently in normal sinus rhythm.  Continue atenolol 25 mg daily along with Eliquis 5 mg twice daily.  CBC is ordered today.  2.  Hypercholesterolemia: Patient remains on Repatha and is followed by Dr. Rennis Golden.  C-Met and lipid panel is ordered today.  She is to see Dr. Rennis Golden on follow-up next month.  He should have these values available to him on review.  3.  Obesity: She is concerned about her weight.  Spoke with her PCP about GLP inhibitor and was not provided this as she is not a diabetic.   I am referring her to [facility] for assistance on weight loss.         Signed, Bettey Mare. Liborio Nixon, ANP, AACC   08/13/2023 4:57 PM      Office [phone] Fax [phone]  Notice: This dictation was prepared with Dragon dictation along with smaller phrase technology. Any transcriptional errors that result from this process are unintentional and may not be corrected upon review.

## 2023-08-13 ENCOUNTER — Ambulatory Visit: Payer: 59 | Attending: Adult Health | Admitting: Adult Health

## 2023-08-13 ENCOUNTER — Encounter: Payer: Self-pay | Admitting: Adult Health

## 2023-08-13 VITALS — BP 134/84 | HR 75 | Ht 64.0 in | Wt 226.2 lb

## 2023-08-13 DIAGNOSIS — E7800 Pure hypercholesterolemia, unspecified: Secondary | ICD-10-CM

## 2023-08-13 DIAGNOSIS — I480 Paroxysmal atrial fibrillation: Secondary | ICD-10-CM

## 2023-08-13 NOTE — Patient Instructions (Signed)
Medication Instructions:  No Changes *If you need a refill on your cardiac medications before your next appointment, please call your pharmacy*   Lab Work: CBC, CMET, Lipid Panel. 1 Week If you have labs (blood work) drawn today and your tests are completely normal, you will receive your results only by: MyChart Message (if you have MyChart) OR A paper copy in the mail If you have any lab test that is abnormal or we need to change your treatment, we will call you to review the results.   Testing/Procedures: No Testing   Follow-Up: At [facility], you and your health needs are our priority.  As part of our continuing mission to provide you with exceptional heart care, we have created designated Provider Care Teams.  These Care Teams include your primary Cardiologist (physician) and Advanced Practice Providers (APPs -  Physician Assistants and Nurse Practitioners) who all work together to provide you with the care you need, when you need it.  We recommend signing up for the patient portal called "MyChart".  Sign up information is provided on this After Visit Summary.  MyChart is used to connect with patients for Virtual Visits (Telemedicine).  Patients are able to view lab/test results, encounter notes, upcoming appointments, etc.  Non-urgent messages can be sent to your provider as well.   To learn more about what you can do with MyChart, go to [email or web address].    Your next appointment:   6 month(s)  Provider:   Chrystie Nose, MD

## 2023-08-16 DIAGNOSIS — I700 Atherosclerosis of aorta: Secondary | ICD-10-CM | POA: Diagnosis not present

## 2023-08-16 DIAGNOSIS — I480 Paroxysmal atrial fibrillation: Secondary | ICD-10-CM | POA: Diagnosis not present

## 2023-08-17 LAB — LIPID PANEL
Chol/HDL Ratio: 2.5 ratio (ref 0.0–4.4)
Cholesterol, Total: 201 mg/dL — ABNORMAL HIGH (ref 100–199)
HDL: 79 mg/dL
LDL Chol Calc (NIH): 112 mg/dL — ABNORMAL HIGH (ref 0–99)
Triglycerides: 53 mg/dL (ref 0–149)
VLDL Cholesterol Cal: 10 mg/dL (ref 5–40)

## 2023-08-17 LAB — COMPREHENSIVE METABOLIC PANEL: Chloride: 101 mmol/L (ref 96–106)

## 2023-08-19 ENCOUNTER — Encounter: Payer: Self-pay | Admitting: Internal Medicine

## 2023-08-19 ENCOUNTER — Telehealth: Payer: Self-pay

## 2023-08-19 ENCOUNTER — Ambulatory Visit (INDEPENDENT_AMBULATORY_CARE_PROVIDER_SITE_OTHER): Payer: 59 | Admitting: Internal Medicine

## 2023-08-19 VITALS — BP 142/86 | HR 65 | Temp 98.9°F | Ht 64.0 in | Wt 222.0 lb

## 2023-08-19 DIAGNOSIS — R718 Other abnormality of red blood cells: Secondary | ICD-10-CM | POA: Diagnosis not present

## 2023-08-19 DIAGNOSIS — E871 Hypo-osmolality and hyponatremia: Secondary | ICD-10-CM | POA: Diagnosis not present

## 2023-08-19 DIAGNOSIS — I10 Essential (primary) hypertension: Secondary | ICD-10-CM | POA: Diagnosis not present

## 2023-08-19 DIAGNOSIS — M5416 Radiculopathy, lumbar region: Secondary | ICD-10-CM | POA: Diagnosis not present

## 2023-08-19 LAB — FERRITIN: Ferritin: 50.8 ng/mL (ref 10.0–291.0)

## 2023-08-19 LAB — IBC PANEL
Iron: 59 ug/dL (ref 42–145)
Saturation Ratios: 16.6 % — ABNORMAL LOW (ref 20.0–50.0)
TIBC: 355.6 ug/dL (ref 250.0–450.0)
Transferrin: 254 mg/dL (ref 212.0–360.0)

## 2023-08-19 NOTE — Telephone Encounter (Addendum)
Called patient regarding results.Left detailed message for patient . Letter mailed to patient 08/19/23.----- Message from Joni Reining sent at 08/18/2023 12:54 PM EDT ----- I have reviewed the labs. Her MCHC and MCH are a little low. This can indicate iron deficiency anemia. The levels are steady when compared to previous levels. Should see PCP for ongoing management.   Cholesterol level is slightly elevated despite being on Repatha. Should follow up with Dr. Sloan Leiter and work on avoiding high cholesterol foods, increase activity by doing purposeful exercise such as walking or riding stationary bike for example, at least 30 minutes a day.

## 2023-08-19 NOTE — Patient Instructions (Signed)
Please continue all other medications as before, including the tylenol for the pain  Please have the pharmacy call with any other refills you may need.  Please continue your efforts at being more active, low cholesterol diet, and weight control.  Please keep your appointments with your specialists as you may have planned  You will be contacted regarding the referral for: MRI and Sports medicine  Please go to the LAB at the blood drawing area for the tests to be done -to check the iron level  You will be contacted by phone if any changes need to be made immediately.  Otherwise, you will receive a letter about your results with an explanation, but please check with MyChart first.

## 2023-08-19 NOTE — Progress Notes (Unsigned)
Patient ID: Tina Stone, female   DOB: August 09, 1950, 73 y.o.   MRN: [id number]        Chief Complaint: follow up low back pain, low MCV, low sodium, htn        HPI:  Tina Stone is a 72 y.o. female here with c/o 2 mo onset left lower back pain, started 7/0, now 5/10, constant, with radiation to LLE with mild weakness, no falls.  Pt continues to have recurring LBP without bowel or bladder change, fever, wt loss,  worsening LE pain/numbness/weakness, gait change or falls. Pt denies chest pain, increased sob or doe, wheezing, orthopnea, PND, increased LE swelling, palpitations, dizziness or syncope.   Pt denies polydipsia, polyuria, or new focal neuro s/s.    Pt denies fever, wt loss, night sweats, loss of appetite, or other constitutional symptoms         Wt Readings from Last 3 Encounters:  08/19/23 222 lb (100.7 kg)  08/13/23 226 lb 3.2 oz (102.6 kg)  08/09/23 224 lb 2 oz (101.7 kg)   BP Readings from Last 3 Encounters:  08/19/23 (!) 142/86  08/13/23 134/84  08/09/23 128/70         Past Medical History:  Diagnosis Date   Adenomatous colon polyp    Allergic rhinitis, seasonal    Allergy    Atrial fibrillation (HCC)    Beta thalassemia trait 11/25/2013   Cholelithiasis    Class 3 obesity without serious comorbidity with body mass index (BMI) of 40.0 to 44.9 in adult 11/19/2012   endometrial ca dx'd 08/2009   endometrial    GERD (gastroesophageal reflux disease)    H. pylori infection    History of radiation therapy    endometrial - 02/19/2022-03/23/2022 Dr Antony Blackbird   HLD (hyperlipidemia)    Hypercholesterolemia    Hypertension 03/18/2017   no meds    Hypothyroidism    Intraductal papilloma of left breast    Patient underwent left needle-localized lumpectomy by Dr. Wilmon Arms. Tsuei on 09/09/2013; pathology showed intraductal papilloma with no atypia or malignancy identified.   PONV (postoperative nausea and vomiting)    Pre-diabetes    pt denies   Urticaria     Uterine fibroid    Past Surgical History:  Procedure Laterality Date   ANKLE FRACTURE SURGERY Right 1966   right   BREAST EXCISIONAL BIOPSY Left 2014   benign   BREAST LUMPECTOMY WITH NEEDLE LOCALIZATION Left 09/09/2013   Procedure: BREAST LUMPECTOMY WITH NEEDLE LOCALIZATION;  Surgeon: Wilmon Arms. Corliss Skains, MD;  Location: [facility];  Service: General;  Laterality: Left;   CHOLECYSTECTOMY     COLONOSCOPY     IR IMAGING GUIDED PORT INSERTION  05/18/2020   IR REMOVAL TUN ACCESS W/ PORT W/O FL MOD SED  01/02/2023   POLYPECTOMY     ROBOTIC ASSISTED LAPAROSCOPIC CHOLECYSTECTOMY  09/09/2019   ROBOTIC ASSISTED TOTAL HYSTERECTOMY WITH BILATERAL SALPINGO OOPHERECTOMY N/A 10/22/2019   Procedure: XI ROBOTIC ASSISTED TOTAL HYSTERECTOMY WITH BILATERAL SALPINGO OOPHORECTOMY GREATER THAN 250 GRAMS, MINI LAPARTOMY FOR SPECIMEN DELIVERY; PELVIC AND PERI-AORTIC LYMPHADENECTOMY;  Surgeon: Adolphus Birchwood, MD;  Location: [facility];  Service: Gynecology;  Laterality: N/A;   SENTINEL NODE BIOPSY N/A 10/22/2019   Procedure: SENTINEL NODE BIOPSY;  Surgeon: Adolphus Birchwood, MD;  Location: [facility];  Service: Gynecology;  Laterality: N/A;    reports that she has never smoked. She has never used smokeless tobacco. She reports that she does not drink alcohol and does not use drugs. family  history includes Asthma in her brother and paternal grandmother; Colon polyps (age of onset: 46) in her mother; Congestive Heart Failure in her father; Dementia (age of onset: 51) in her mother; Diabetes in her brother, father, maternal grandmother, mother, and paternal grandfather; Hypertension in her brother, brother, maternal grandmother, and mother; Lung disease in her paternal grandmother; Pancreatic cancer in her paternal aunt; Uterine cancer in her paternal aunt. Allergies  Allergen Reactions   Benicar Hct [Olmesartan Medoxomil-Hctz] Shortness Of Breath and Palpitations   Iodinated Contrast Media Hives and Itching    02/02/2021-  pt developed 2 hives  and itching even with the 13hr premedication.  Radiology PA came into eval the pt.  Developed itching and hives after injection on 05/10/20; needs 13hr prep in future   Crestor [Rosuvastatin] Other (See Comments)    Muscle aches   Bactrim [Sulfamethoxazole-Trimethoprim] Other (See Comments)    Abdominal pain, dizziness   Pravachol [Pravastatin] Other (See Comments)    Lower abdominal pain   Shellfish Allergy Nausea And Vomiting   Amlodipine Palpitations   Current Outpatient Medications on File Prior to Visit  Medication Sig Dispense Refill   acetaminophen (TYLENOL) 500 MG tablet Take 1,000 mg by mouth every 6 (six) hours as needed for moderate pain.     APPLE CIDER VINEGAR PO Take 5 mLs by mouth daily.      atenolol (TENORMIN) 25 MG tablet TAKE 1 TABLET (25 MG TOTAL) BY MOUTH DAILY. 90 tablet 0   Cholecalciferol (VITAMIN D3) 50 MCG (2000 UT) TABS Take by mouth.     ELIQUIS 5 MG TABS tablet TAKE 1 TABLET BY MOUTH TWICE A DAY 180 tablet 0   Evolocumab (REPATHA SURECLICK) 140 MG/ML SOAJ Inject 140 mg into the skin every 14 (fourteen) days. 2 mL 11   Multiple Vitamin (MULTIVITAMIN) capsule Take 1 capsule by mouth daily.     UNITHROID 75 MCG tablet Take 1 tablet (75 mcg total) by mouth daily before breakfast. 90 tablet 0   No current facility-administered medications on file prior to visit.        ROS:  All others reviewed and negative.  Objective        PE:  BP (!) 142/86 (BP Location: Left Arm, Patient Position: Sitting, Cuff Size: Normal)   Pulse 65   Temp 98.9 F (37.2 C) (Oral)   Ht 5\' 4"  (1.626 m)   Wt 222 lb (100.7 kg)   SpO2 99%   BMI 38.11 kg/m                 Constitutional: Pt appears in NAD               HENT: Head: NCAT.                Right Ear: External ear normal.                 Left Ear: External ear normal.                Eyes: . Pupils are equal, round, and reactive to light. Conjunctivae and EOM are normal               Nose: without d/c or deformity                Neck: Neck supple. Gross normal ROM               Cardiovascular: Normal rate and regular rhythm.  Pulmonary/Chest: Effort normal and breath sounds without rales or wheezing.                Abd:  Soft, NT, ND, + BS, no organomegaly               Neurological: Pt is alert. At baseline orientation, motor with 4/5 LLE motor               Skin: Skin is warm. No rashes, no other new lesions, LE edema - none               Psychiatric: Pt behavior is normal without agitation   Micro: none  Cardiac tracings I have personally interpreted today:  none  Pertinent Radiological findings (summarize): none   Lab Results  Component Value Date   WBC 4.3 08/16/2023   HGB 12.2 08/16/2023   HCT 39.1 08/16/2023   PLT 214 08/16/2023   GLUCOSE 83 08/16/2023   CHOL 201 (H) 08/16/2023   TRIG 53 08/16/2023   HDL 79 08/16/2023   LDLCALC 112 (H) 08/16/2023   ALT 12 08/16/2023   AST 18 08/16/2023   NA 140 08/16/2023   K 4.3 08/16/2023   CL 101 08/16/2023   CREATININE 0.82 08/16/2023   BUN 10 08/16/2023   CO2 25 08/16/2023   TSH 1.19 08/09/2023   INR 1.0 01/28/2021   HGBA1C 5.3 02/02/2022   Assessment/Plan:  Sareena Benedick is a 73 y.o. Black or African American [2] female with  has a past medical history of Adenomatous colon polyp, Allergic rhinitis, seasonal, Allergy, Atrial fibrillation (HCC), Beta thalassemia trait (11/25/2013), Cholelithiasis, Class 3 obesity without serious comorbidity with body mass index (BMI) of 40.0 to 44.9 in adult (11/19/2012), endometrial ca (dx'd 08/2009), GERD (gastroesophageal reflux disease), H. pylori infection, History of radiation therapy, HLD (hyperlipidemia), Hypercholesterolemia, Hypertension (03/18/2017), Hypothyroidism, Intraductal papilloma of left breast, PONV (postoperative nausea and vomiting), Pre-diabetes, Urticaria, and Uterine fibroid.  Hyponatremia ? Chronic new - for f/u lab  Low mean corpuscular volume (MCV) Mild recent, can't  r/o low iron - for f/u lab  Left lumbar radiculopathy Chronic with recent worsening and mild LLE weakness , for MRI, refer sports medicine, declines other pain med for now  Essential hypertension BP Readings from Last 3 Encounters:  08/19/23 (!) 142/86  08/13/23 134/84  08/09/23 128/70   Uncontrolled, likely situational,, pt to continue medical treatment tenormin 25 every day, and f/u bp at home and next visit  Followup: Return if symptoms worsen or fail to improve.  Oliver Barre, MD 08/22/2023 8:35 PM [facility]

## 2023-08-20 ENCOUNTER — Ambulatory Visit: Admission: RE | Admit: 2023-08-20 | Payer: 59 | Source: Ambulatory Visit

## 2023-08-20 DIAGNOSIS — M5416 Radiculopathy, lumbar region: Secondary | ICD-10-CM | POA: Diagnosis not present

## 2023-08-22 ENCOUNTER — Encounter: Payer: Self-pay | Admitting: Internal Medicine

## 2023-08-22 NOTE — Assessment & Plan Note (Signed)
Chronic with recent worsening and mild LLE weakness , for MRI, refer sports medicine, declines other pain med for now

## 2023-08-22 NOTE — Assessment & Plan Note (Signed)
?   Chronic new - for f/u lab

## 2023-08-22 NOTE — Assessment & Plan Note (Signed)
BP Readings from Last 3 Encounters:  08/19/23 (!) 142/86  08/13/23 134/84  08/09/23 128/70   Uncontrolled, likely situational,, pt to continue medical treatment tenormin 25 every day, and f/u bp at home and next visit

## 2023-08-22 NOTE — Assessment & Plan Note (Signed)
Mild recent, can't r/o low iron - for f/u lab

## 2023-08-22 NOTE — Progress Notes (Signed)
Tina Stone D.[facility] [address] [place] [address] Phone: [phone]   Assessment and Plan:     1. Chronic bilateral low back pain with left-sided sciatica 2. DDD (degenerative disc disease), lumbar 3. Spinal stenosis of lumbar region with neurogenic claudication - Chronic with exacerbation, initial sports medicine visit - Consistent with spinal and foraminal stenosis worse at left-sided L2-L3 causing low back pain with left-sided radicular symptoms into left thigh - Discussed epidural CSI which I believe would be beneficial for patient.  Patient declined at this time and wishes to pursue exercise, weight loss, and other conservative options.  Could consider epidural CSI at future visit at left-sided L2-L3 - Do not recommend NSAID or prednisone use due to history of paroxysmal atrial fibrillation on chronic anticoagulation with Eliquis - Recommend using Tylenol for day-to-day pain relief - Start HEP and physical therapy for low back.  Referral sent - Reviewed patient's MRI with patient in room today   Pertinent previous records reviewed include    Follow Up: 4 to 6 weeks for reevaluation.  If no improvement or worsening of symptoms, could consider epidural CSI left-sided L2-L3   Subjective:    Chief Complaint: Back pain   HPI:   08/23/2023 Patient is a 73 year old female presenting with left lumbar radiculopathy referred by PCP. Patient was seen by PCP on 08/19/23 patient has had an x ray and an MRI for this issue. Today patient states continued pain in the lower back and left hip. Was able to exercise in the pool yesterday. Warm water and pressure from the shower helps. Pain will occasionally radiate into the leg but not beyond the knee. Denies bowel/bladder dysfunction. Denies new injury.   Relevant Historical Information: On chronic anticoagulation with Eliquis, paroxysmal atrial fibrillation, hypertension, GERD, history of  endometrial cancer  Additional pertinent review of systems negative.   Current Outpatient Medications:    acetaminophen (TYLENOL) 500 MG tablet, Take 1,000 mg by mouth every 6 (six) hours as needed for moderate pain., Disp: , Rfl:    APPLE CIDER VINEGAR PO, Take 5 mLs by mouth daily. , Disp: , Rfl:    atenolol (TENORMIN) 25 MG tablet, TAKE 1 TABLET (25 MG TOTAL) BY MOUTH DAILY., Disp: 90 tablet, Rfl: 0   Cholecalciferol (VITAMIN D3) 50 MCG (2000 UT) TABS, Take by mouth., Disp: , Rfl:    ELIQUIS 5 MG TABS tablet, TAKE 1 TABLET BY MOUTH TWICE A DAY, Disp: 180 tablet, Rfl: 0   Evolocumab (REPATHA SURECLICK) 140 MG/ML SOAJ, Inject 140 mg into the skin every 14 (fourteen) days., Disp: 2 mL, Rfl: 11   Multiple Vitamin (MULTIVITAMIN) capsule, Take 1 capsule by mouth daily., Disp: , Rfl:    UNITHROID 75 MCG tablet, Take 1 tablet (75 mcg total) by mouth daily before breakfast., Disp: 90 tablet, Rfl: 0   Objective:     Vitals:   08/23/23 0817  BP: 138/78  Pulse: 74  SpO2: 97%  Weight: 223 lb (101.2 kg)  Height: 5\' 4"  (1.626 m)      Body mass index is 38.28 kg/m.    Physical Exam:    Gen: Appears well, nad, nontoxic and pleasant Psych: Alert and oriented, appropriate mood and affect Neuro: sensation intact, strength is 5/5 in upper and lower extremities, muscle tone wnl Skin: no susupicious lesions or rashes  Back - Normal skin, Spine with normal alignment and no deformity.   No tenderness to vertebral process palpation.  Bilateral lumbar paraspinous muscles are   tender and without spasm NTTP gluteal musculature Straight leg raise positive on the left   Gait antalgic, favoring right leg   Electronically signed by:  Tina Stone [facility] 9:11 AM 08/23/23

## 2023-08-23 ENCOUNTER — Ambulatory Visit (INDEPENDENT_AMBULATORY_CARE_PROVIDER_SITE_OTHER): Payer: 59 | Admitting: Sports Medicine

## 2023-08-23 ENCOUNTER — Encounter: Payer: Self-pay | Admitting: Sports Medicine

## 2023-08-23 VITALS — BP 138/78 | HR 74 | Ht 64.0 in | Wt 223.0 lb

## 2023-08-23 DIAGNOSIS — M48062 Spinal stenosis, lumbar region with neurogenic claudication: Secondary | ICD-10-CM | POA: Diagnosis not present

## 2023-08-23 DIAGNOSIS — G8929 Other chronic pain: Secondary | ICD-10-CM

## 2023-08-23 DIAGNOSIS — M5136 Other intervertebral disc degeneration, lumbar region: Secondary | ICD-10-CM | POA: Diagnosis not present

## 2023-08-23 DIAGNOSIS — M5442 Lumbago with sciatica, left side: Secondary | ICD-10-CM

## 2023-08-23 NOTE — Patient Instructions (Signed)
-   Start Tylenol 500 to 1000 mg tablets 2-3 times a day for day-to-day pain relief   We've placed a referral to physical therapy, you should hear from them within the next week.   Home Exercise Program provided [facility] code: [email or web address]

## 2023-08-26 ENCOUNTER — Encounter: Payer: Self-pay | Admitting: Sports Medicine

## 2023-08-26 ENCOUNTER — Ambulatory Visit
Admission: RE | Admit: 2023-08-26 | Discharge: 2023-08-26 | Disposition: A | Discharge status: Home / Self Care | Payer: 59 | Source: Ambulatory Visit | Attending: Internal Medicine | Admitting: Internal Medicine

## 2023-08-26 DIAGNOSIS — Z1231 Encounter for screening mammogram for malignant neoplasm of breast: Secondary | ICD-10-CM | POA: Diagnosis not present

## 2023-08-28 ENCOUNTER — Ambulatory Visit: Payer: 59

## 2023-08-28 ENCOUNTER — Ambulatory Visit (INDEPENDENT_AMBULATORY_CARE_PROVIDER_SITE_OTHER): Payer: 59

## 2023-08-28 ENCOUNTER — Other Ambulatory Visit: Payer: Self-pay | Admitting: Internal Medicine

## 2023-08-28 DIAGNOSIS — Z23 Encounter for immunization: Secondary | ICD-10-CM

## 2023-08-28 DIAGNOSIS — R928 Other abnormal and inconclusive findings on diagnostic imaging of breast: Secondary | ICD-10-CM

## 2023-08-30 ENCOUNTER — Ambulatory Visit
Admission: RE | Admit: 2023-08-30 | Discharge: 2023-08-30 | Disposition: A | Discharge status: Home / Self Care | Payer: 59 | Source: Ambulatory Visit | Attending: Internal Medicine | Admitting: Internal Medicine

## 2023-08-30 ENCOUNTER — Ambulatory Visit: Admission: RE | Admit: 2023-08-30 | Payer: 59 | Source: Ambulatory Visit

## 2023-08-30 DIAGNOSIS — R928 Other abnormal and inconclusive findings on diagnostic imaging of breast: Secondary | ICD-10-CM

## 2023-08-30 DIAGNOSIS — N6311 Unspecified lump in the right breast, upper outer quadrant: Secondary | ICD-10-CM | POA: Diagnosis not present

## 2023-08-30 NOTE — Addendum Note (Signed)
Addended by: Darryll Capers on: 08/30/2023 12:22 PM   Modules accepted: Orders

## 2023-09-03 ENCOUNTER — Other Ambulatory Visit: Payer: Self-pay | Admitting: Internal Medicine

## 2023-09-03 DIAGNOSIS — N6310 Unspecified lump in the right breast, unspecified quadrant: Secondary | ICD-10-CM

## 2023-09-04 NOTE — Addendum Note (Signed)
Addended by: Darryll Capers on: 09/04/2023 01:45 PM   Modules accepted: Orders

## 2023-09-06 ENCOUNTER — Encounter: Payer: Self-pay | Admitting: Physical Therapy

## 2023-09-06 ENCOUNTER — Ambulatory Visit (INDEPENDENT_AMBULATORY_CARE_PROVIDER_SITE_OTHER): Payer: 59 | Admitting: Physical Therapy

## 2023-09-06 ENCOUNTER — Ambulatory Visit
Admission: RE | Admit: 2023-09-06 | Discharge: 2023-09-06 | Disposition: A | Discharge status: Home / Self Care | Payer: 59 | Source: Ambulatory Visit | Attending: Internal Medicine | Admitting: Internal Medicine

## 2023-09-06 ENCOUNTER — Other Ambulatory Visit: Payer: Self-pay | Admitting: Internal Medicine

## 2023-09-06 DIAGNOSIS — N6310 Unspecified lump in the right breast, unspecified quadrant: Secondary | ICD-10-CM

## 2023-09-06 DIAGNOSIS — M6281 Muscle weakness (generalized): Secondary | ICD-10-CM | POA: Diagnosis not present

## 2023-09-06 DIAGNOSIS — M5459 Other low back pain: Secondary | ICD-10-CM

## 2023-09-06 NOTE — Therapy (Signed)
OUTPATIENT PHYSICAL THERAPY THORACOLUMBAR EVALUATION   Patient Name: Tina Stone MRN: [id number] DOB:[birth year], 73 y.o., female Today's Date: 09/06/2023  END OF SESSION:  PT End of Session - 09/06/23 1027     Visit Number 1    Number of Visits 16    Date for PT Re-Evaluation 11/01/23    Authorization Type UHC- Medicare    PT Start Time 0930    PT Stop Time 1014    PT Time Calculation (min) 44 min    Activity Tolerance Patient tolerated treatment well    Behavior During Therapy WFL for tasks assessed/performed              Past Medical History:  Diagnosis Date   Adenomatous colon polyp    Allergic rhinitis, seasonal    Allergy    Atrial fibrillation (HCC)    Beta thalassemia trait 11/25/2013   Cholelithiasis    Class 3 obesity without serious comorbidity with body mass index (BMI) of 40.0 to 44.9 in adult 11/19/2012   endometrial ca dx'd 08/2009   endometrial    GERD (gastroesophageal reflux disease)    H. pylori infection    History of radiation therapy    endometrial - 02/19/2022-03/23/2022 Dr Antony Blackbird   HLD (hyperlipidemia)    Hypercholesterolemia    Hypertension 03/18/2017   no meds    Hypothyroidism    Intraductal papilloma of left breast    Patient underwent left needle-localized lumpectomy by Dr. Wilmon Arms. Tsuei on 09/09/2013; pathology showed intraductal papilloma with no atypia or malignancy identified.   PONV (postoperative nausea and vomiting)    Pre-diabetes    pt denies   Urticaria    Uterine fibroid    Past Surgical History:  Procedure Laterality Date   ANKLE FRACTURE SURGERY Right 1966   right   BREAST EXCISIONAL BIOPSY Left 2014   benign   BREAST LUMPECTOMY WITH NEEDLE LOCALIZATION Left 09/09/2013   Procedure: BREAST LUMPECTOMY WITH NEEDLE LOCALIZATION;  Surgeon: Wilmon Arms. Corliss Skains, MD;  Location: [facility];  Service: General;  Laterality: Left;   CHOLECYSTECTOMY     COLONOSCOPY     IR IMAGING GUIDED PORT INSERTION  05/18/2020   IR  REMOVAL TUN ACCESS W/ PORT W/O FL MOD SED  01/02/2023   POLYPECTOMY     ROBOTIC ASSISTED LAPAROSCOPIC CHOLECYSTECTOMY  09/09/2019   ROBOTIC ASSISTED TOTAL HYSTERECTOMY WITH BILATERAL SALPINGO OOPHERECTOMY N/A 10/22/2019   Procedure: XI ROBOTIC ASSISTED TOTAL HYSTERECTOMY WITH BILATERAL SALPINGO OOPHORECTOMY GREATER THAN 250 GRAMS, MINI LAPARTOMY FOR SPECIMEN DELIVERY; PELVIC AND PERI-AORTIC LYMPHADENECTOMY;  Surgeon: Adolphus Birchwood, MD;  Location: [facility];  Service: Gynecology;  Laterality: N/A;   SENTINEL NODE BIOPSY N/A 10/22/2019   Procedure: SENTINEL NODE BIOPSY;  Surgeon: Adolphus Birchwood, MD;  Location: [facility];  Service: Gynecology;  Laterality: N/A;   Patient Active Problem List   Diagnosis Date Noted   Left lumbar radiculopathy 08/19/2023   Hyponatremia 08/19/2023   Low mean corpuscular volume (MCV) 08/19/2023   Acute bilateral back pain 08/09/2023   Hematuria 08/09/2023   Gastritis 06/02/2023   Constipation 06/02/2023   Abdominal pain 04/01/2023   Congestion of nasal sinus 02/01/2023   Polyp of colon 01/09/2023   Benign liver cyst 12/21/2022   Right hip pain 08/28/2022   Aortic atherosclerosis (HCC) 08/01/2022   Chronic idiopathic urticaria 11/22/2021   Chronic anticoagulation 10/02/2021   Visit for screening mammogram 08/18/2021   Acquired hypothyroidism 08/18/2021   Allergic rhinoconjunctivitis 08/18/2021   Encounter for general adult medical  examination with abnormal findings 08/18/2021   Hyperlipidemia with target LDL less than 100 08/17/2021   Laryngitis, chronic 08/17/2021   Paroxysmal atrial fibrillation (HCC) 02/03/2021   Secondary hypercoagulable state (HCC) 02/03/2021   Essential hypertension 11/16/2020   Vitamin D deficiency 10/27/2020   Endometrial cancer (HCC) 10/22/2019   Upper respiratory infection, viral 04/01/2018   Acute cough 02/02/2014   Beta thalassemia trait 11/25/2013   Class II obesity 11/19/2012   Gastroesophageal reflux disease 01/28/2007    PCP:  Etta Grandchild, MD  REFERRING PROVIDER: Richardean Sale, DO  REFERRING DIAG: Chronic bil low back pain, Spinal stenosis   Rationale for Evaluation and Treatment: Rehabilitation  THERAPY DIAG:  Other low back pain  Muscle weakness (generalized)  ONSET DATE: 2-3 years   SUBJECTIVE:                                                                                                                                                                                            SUBJECTIVE STATEMENT:  Eval: Increased pain for 2-3 years, worsening lately.  She likes to be in water, has been exercising at aquatic center 2- 3d/wk. Not doing formal class. States being in pool helps. She is not doing any regular exercise or specific activities for back.  Increased pain: with walking for a while. Able to Stand/walk about 1 hr.  She is still Taking tylenol daily for pain.    PERTINENT HISTORY:  HTN, , A- Fib, previous cancer.   PAIN:  Are you having pain? Yes: NPRS scale:  4-5/10 Pain location: Bilat lower back  Pain description: sore, constant  Aggravating factors: standing, walking   Relieving factors: sitting, or changing positions    PRECAUTIONS: None  WEIGHT BEARING RESTRICTIONS: No  FALLS:  Has patient fallen in last 6 months? No   PLOF: Independent  PATIENT GOALS: Decreased pain in back.  Continue exercise in pool .   NEXT MD VISIT:   OBJECTIVE:   DIAGNOSTIC FINDINGS:  MRI lumbar:  IMPRESSION: Assessment is limited due to poor signal to noise ratio.   1. Within this limitation, there is multilevel degenerative disc and facet disease, which is most pronounced at L2-L3 where there is mild to moderate spinal canal narrowing and severe left and moderate right neural foraminal narrowing. 2. Moderate bilateral neural foraminal narrowing at L3-L4, L4-L5, and L5-S1   PATIENT SURVEYS:  Foto:   COGNITION: Overall cognitive status: Within functional limits for tasks  assessed     SENSATION: WFL  POSTURE: No Significant postural limitations  PALPATION: Pain in L low lumbar paraspinals and QL, into L SI.   LUMBAR  ROM:  Hips: WFL  Knees: WFL   AROM eval  Flexion WFL  Extension Sig limitation/pain    Right lateral flexion WFL  Left lateral flexion WFL  Right rotation WFL  Left rotation WFL   (Blank rows = not tested)    LOWER EXTREMITY MMT:   Hip flex: 4-/5 bil;  Hip abd: 4/5 bil  Knee flex/ext : 4+/5 bil;   GAIT: Distance walked: 2ft  Assistive device utilized: None Level of assistance: Complete Independence Comments: Normal gait   TODAY'S TREATMENT:                                                                                                                              DATE:  09/06/2023 Ther ex: See below for HEP   PATIENT EDUCATION:  Education details: PT POC, exam findings, HEP.  Person educated: Patient Education method: Medical illustrator Education comprehension: verbalized understanding and returned demonstration  HOME EXERCISE PROGRAM: Access Code: [email or web address] URL: [email or web address] Date: 09/06/2023 Prepared by: Sedalia Muta  Exercises - Hooklying Single Knee to Chest Stretch  - 2 x daily - 3 reps - 20 hold - Supine Piriformis Stretch Pulling Heel to Hip  - 2 x daily - 3 reps - 20 hold - Supine Lower Trunk Rotation  - 1-2 x daily - 10 reps - 5-10 sec  hold - Seated Hamstring Stretch  - 2 x daily - 3 reps - 20 hold   ASSESSMENT:  CLINICAL IMPRESSION:  Patient referred to PT for lower back pain. She demonstrates normal posture, and functional ROM. Pt is most limited with lumbar extension. She states that her pain is most prominent at night. She is requesting to be seen for one more follow up appt to be provided a comprehensive HEP. Patient will benefit from skilled PT to address below impairments, limitations and improve overall function.  OBJECTIVE IMPAIRMENTS: decreased activity  tolerance, decreased mobility, decreased ROM, decreased strength, increased muscle spasms, improper body mechanics, and pain.    ACTIVITY LIMITATIONS:  lifting, standing, squatting, stairs, transfers, hygiene/grooming, and locomotion level   PARTICIPATION LIMITATIONS: meal prep, cleaning, laundry, shopping, and community activity   PERSONAL FACTORS: Time since onset    are also affecting patient's functional outcome.  REHAB POTENTIAL: Good  CLINICAL DECISION MAKING: Stable/uncomplicated  EVALUATION COMPLEXITY: Low    GOALS: SHORT TERM GOALS: Target date: 09/20/2023  Patient to be independent with initial HEP for land exercises   Goal status: INITIAL    LONG TERM GOALS: Target date: 11/01/2023  Patient to be independent with final HEP  for pool and land exercises   Goal status: INITIAL  2.  Patient to demo improved strength of bil hips to at least 4/5   to improve stability and pain  Goal status: INITIAL  3. Pt to demo improved back pain to 0-4/10 with activity.    Goal status: INITIAL   4.  Patient to demo ability for bend, squat, with optimal  mechanics, to improve ability for IADLs.   Goal status: INITIAL     PLAN: PT FREQUENCY:  1-2  x per week   PT DURATION: 8 weeks  PLANNED INTERVENTIONS  aquatic PT, Canalith repositioning, cryotherapy, Electrical stimulation, Iontophoresis with 4 mg/ml dexamethasome, Moist heat, traction, Ultrasound, gait training, Therapeutic exercise, balance training, neuromuscular re-education, patient/family education, prosthetic training, manual techniques, passive ROM, dry needling, taping, vasopnuematic device, vestibular, spinal manipulations, joint manipulations   PLAN FOR NEXT SESSION:  Flexion mobility, educate on HEP, TA , core and hip strength,  review pool program   Sedalia Muta, PT, DPT 10:33 AM  09/06/23

## 2023-09-13 ENCOUNTER — Ambulatory Visit: Payer: 59 | Admitting: Internal Medicine

## 2023-09-13 ENCOUNTER — Ambulatory Visit: Payer: 59 | Admitting: Adult Health

## 2023-09-13 ENCOUNTER — Ambulatory Visit: Payer: 59 | Admitting: Gastroenterology

## 2023-09-13 ENCOUNTER — Telehealth: Payer: Self-pay | Admitting: *Deleted

## 2023-09-13 ENCOUNTER — Ambulatory Visit (INDEPENDENT_AMBULATORY_CARE_PROVIDER_SITE_OTHER): Payer: 59 | Admitting: Gastroenterology

## 2023-09-13 ENCOUNTER — Encounter: Payer: Self-pay | Admitting: Gastroenterology

## 2023-09-13 VITALS — BP 126/70 | HR 64 | Ht 62.0 in | Wt 221.2 lb

## 2023-09-13 DIAGNOSIS — Z7901 Long term (current) use of anticoagulants: Secondary | ICD-10-CM | POA: Diagnosis not present

## 2023-09-13 DIAGNOSIS — Z09 Encounter for follow-up examination after completed treatment for conditions other than malignant neoplasm: Secondary | ICD-10-CM

## 2023-09-13 DIAGNOSIS — Z860100 Personal history of colon polyps, unspecified: Secondary | ICD-10-CM | POA: Insufficient documentation

## 2023-09-13 NOTE — Patient Instructions (Signed)
You will be contacted by our office prior to your procedure for directions on holding your Eliquis.  If you do not hear from our office 1 week prior to your scheduled procedure, please call [phone] to discuss.   You have been scheduled for a colonoscopy. Please follow written instructions given to you at your visit today.   Please pick up your prep supplies at the pharmacy within the next 1-3 days.  If you use inhalers (even only as needed), please bring them with you on the day of your procedure.  DO NOT TAKE 7 DAYS PRIOR TO TEST- Trulicity (dulaglutide) Ozempic, Wegovy (semaglutide) Mounjaro (tirzepatide) Bydureon Bcise (exanatide extended release)  DO NOT TAKE 1 DAY PRIOR TO YOUR TEST Rybelsus (semaglutide) Adlyxin (lixisenatide) Victoza (liraglutide) Byetta (exanatide) ___________________________________________________________________________  _______________________________________________________  If your blood pressure at your visit was 140/90 or greater, please contact your primary care physician to follow up on this.  _______________________________________________________  If you are age 11 or older, your body mass index should be between 23-30. Your Body mass index is 40.47 kg/m. If this is out of the aforementioned range listed, please consider follow up with your Primary Care Provider.  If you are age 76 or younger, your body mass index should be between 19-25. Your Body mass index is 40.47 kg/m. If this is out of the aformentioned range listed, please consider follow up with your Primary Care Provider.   ________________________________________________________  The [facility] providers would like to encourage you to use [facility] to communicate with providers for non-urgent requests or questions.  Due to long hold times on the telephone, sending your provider a message by [facility] may be a faster and more efficient way to get a response.  Please allow 48 business  hours for a response.  Please remember that this is for non-urgent requests.  _______________________________________________________

## 2023-09-13 NOTE — Progress Notes (Signed)
09/13/2023 Tina Stone [id number] 05/06/1950   HISTORY OF PRESENT ILLNESS: This is a 73 year old female who is a patient of Dr. Elana Alm.  [facility] is here today to schedule colonoscopy.  She has a history of colon polyps, last colonoscopy in October 2019 showed 2 polyps that were removed, one was 3 mm, one was 8 mm, tubular adenomas.  Repeat was recommended in 5 years.  She is now on Eliquis for paroxysmal atrial fibrillation.  Last seen by Dr. Rennis Golden earlier this year with cardiology.  She moves her bowels well without any issues.  No rectal bleeding.   Past Medical History:  Diagnosis Date   Adenomatous colon polyp    Allergic rhinitis, seasonal    Allergy    Atrial fibrillation (HCC)    Beta thalassemia trait 11/25/2013   Cholelithiasis    Class 3 obesity without serious comorbidity with body mass index (BMI) of 40.0 to 44.9 in adult 11/19/2012   DDD (degenerative disc disease), lumbar    endometrial ca dx'd 08/2009   endometrial    GERD (gastroesophageal reflux disease)    H. pylori infection    History of radiation therapy    endometrial - 02/19/2022-03/23/2022 Dr Antony Blackbird   HLD (hyperlipidemia)    Hypercholesterolemia    Hypertension 03/18/2017   no meds    Hypothyroidism    Intraductal papilloma of left breast    Patient underwent left needle-localized lumpectomy by Dr. Wilmon Arms. Tsuei on 09/09/2013; pathology showed intraductal papilloma with no atypia or malignancy identified.   PONV (postoperative nausea and vomiting)    Pre-diabetes    pt denies   Sciatica    Spinal stenosis    Urticaria    Uterine fibroid    Past Surgical History:  Procedure Laterality Date   ANKLE FRACTURE SURGERY Right 1966   right   BREAST EXCISIONAL BIOPSY Left 2014   benign   BREAST LUMPECTOMY WITH NEEDLE LOCALIZATION Left 09/09/2013   Procedure: BREAST LUMPECTOMY WITH NEEDLE LOCALIZATION;  Surgeon: Wilmon Arms. Corliss Skains, MD;  Location: [facility] OR;  Service: General;  Laterality: Left;    CHOLECYSTECTOMY     COLONOSCOPY     IR IMAGING GUIDED PORT INSERTION  05/18/2020   IR REMOVAL TUN ACCESS W/ PORT W/O FL MOD SED  01/02/2023   POLYPECTOMY     ROBOTIC ASSISTED LAPAROSCOPIC CHOLECYSTECTOMY  09/09/2019   ROBOTIC ASSISTED TOTAL HYSTERECTOMY WITH BILATERAL SALPINGO OOPHERECTOMY N/A 10/22/2019   Procedure: XI ROBOTIC ASSISTED TOTAL HYSTERECTOMY WITH BILATERAL SALPINGO OOPHORECTOMY GREATER THAN 250 GRAMS, MINI LAPARTOMY FOR SPECIMEN DELIVERY; PELVIC AND PERI-AORTIC LYMPHADENECTOMY;  Surgeon: Adolphus Birchwood, MD;  Location: [facility];  Service: Gynecology;  Laterality: N/A;   SENTINEL NODE BIOPSY N/A 10/22/2019   Procedure: SENTINEL NODE BIOPSY;  Surgeon: Adolphus Birchwood, MD;  Location: [facility];  Service: Gynecology;  Laterality: N/A;    reports that she has never smoked. She has never used smokeless tobacco. She reports that she does not drink alcohol and does not use drugs. family history includes Asthma in her brother and paternal grandmother; Colon polyps (age of onset: 53) in her mother; Congestive Heart Failure in her father; Dementia (age of onset: 56) in her mother; Diabetes in her brother, father, maternal grandmother, mother, and paternal grandfather; Hypertension in her brother, brother, maternal grandmother, and mother; Lung disease in her paternal grandmother; Pancreatic cancer in her paternal aunt; Uterine cancer in her paternal aunt. Allergies  Allergen Reactions   Benicar Hct [Olmesartan Medoxomil-Hctz] Shortness Of Breath  and Palpitations   Iodinated Contrast Media Hives and Itching    02/02/2021-  pt developed 2 hives and itching even with the 13hr premedication.  Radiology PA came into eval the pt.  Developed itching and hives after injection on 05/10/20; needs 13hr prep in future   Crestor [Rosuvastatin] Other (See Comments)    Muscle aches   Bactrim [Sulfamethoxazole-Trimethoprim] Other (See Comments)    Abdominal pain, dizziness   Pravachol [Pravastatin] Other (See Comments)     Lower abdominal pain   Shellfish Allergy Nausea And Vomiting   Amlodipine Palpitations      Outpatient Encounter Medications as of 09/13/2023  Medication Sig   acetaminophen (TYLENOL) 500 MG tablet Take 1,000 mg by mouth every 6 (six) hours as needed for moderate pain.   APPLE CIDER VINEGAR PO Take 5 mLs by mouth as needed.   atenolol (TENORMIN) 25 MG tablet TAKE 1 TABLET (25 MG TOTAL) BY MOUTH DAILY.   Cholecalciferol (VITAMIN D3) 50 MCG (2000 UT) TABS Take by mouth.   ELIQUIS 5 MG TABS tablet TAKE 1 TABLET BY MOUTH TWICE A DAY   Evolocumab (REPATHA SURECLICK) 140 MG/ML SOAJ Inject 140 mg into the skin every 14 (fourteen) days.   Multiple Vitamin (MULTIVITAMIN) capsule Take 1 capsule by mouth daily.   UNITHROID 75 MCG tablet Take 1 tablet (75 mcg total) by mouth daily before breakfast.   No facility-administered encounter medications on file as of 09/13/2023.     REVIEW OF SYSTEMS  : All other systems reviewed and negative except where noted in the History of Present Illness.   PHYSICAL EXAM: BP 126/70 (BP Location: Left Arm, Patient Position: Sitting, Cuff Size: Large)   Pulse 64   Ht 5\' 2"  (1.575 m)   Wt 221 lb 4 oz (100.4 kg)   BMI 40.47 kg/m  General: Well developed female in no acute distress Head: Normocephalic and atraumatic Eyes:  Sclerae anicteric, conjunctiva pink. Ears: Normal auditory acuity Lungs: Clear throughout to auscultation; no W/R/R. Heart: Regular rate and rhythm; no M/R/G. Rectal:  Will be done at the time of colonoscopy. Musculoskeletal: Symmetrical with no gross deformities  Skin: No lesions on visible extremities Extremities: No edema  Neurological: Alert oriented x 4, grossly non-focal Psychological:  Alert and cooperative. Normal mood and affect  ASSESSMENT AND PLAN: *Personal history of colon polyps: Had a couple of tubular adenomas removed in 2019, was put in for a 5-year colonoscopy recall.  Will schedule with Dr. Lavon Paganini. *Chronic  anticoagulation with Eliquis for history of atrial fibrillation:  Will hold Eliquis for 2 days prior to endoscopic procedures - will instruct when and how to resume after procedure. Benefits and risks of procedure explained including risks of bleeding, perforation, infection, missed lesions, reactions to medications and possible need for hospitalization and surgery for complications. Additional rare but real risk of stroke or other vascular clotting events off of Eliquis also explained and need to seek urgent help if any signs of these problems occur. Will communicate by phone or EMR with patient's  prescribing provider to confirm that holding Eliquis is reasonable in this case.     CC:  Etta Grandchild, MD

## 2023-09-14 ENCOUNTER — Other Ambulatory Visit: Payer: 59

## 2023-09-19 ENCOUNTER — Other Ambulatory Visit: Payer: Self-pay | Admitting: Internal Medicine

## 2023-09-19 DIAGNOSIS — D6869 Other thrombophilia: Secondary | ICD-10-CM

## 2023-09-19 DIAGNOSIS — I480 Paroxysmal atrial fibrillation: Secondary | ICD-10-CM

## 2023-09-20 ENCOUNTER — Inpatient Hospital Stay: Payer: 59 | Attending: Hematology and Oncology

## 2023-09-20 ENCOUNTER — Ambulatory Visit (HOSPITAL_COMMUNITY)
Admission: RE | Admit: 2023-09-20 | Discharge: 2023-09-20 | Disposition: A | Discharge status: Home / Self Care | Payer: 59 | Source: Ambulatory Visit | Attending: Hematology and Oncology

## 2023-09-20 DIAGNOSIS — C541 Malignant neoplasm of endometrium: Secondary | ICD-10-CM

## 2023-09-20 DIAGNOSIS — M5136 Other intervertebral disc degeneration, lumbar region: Secondary | ICD-10-CM | POA: Insufficient documentation

## 2023-09-20 DIAGNOSIS — M1611 Unilateral primary osteoarthritis, right hip: Secondary | ICD-10-CM | POA: Insufficient documentation

## 2023-09-20 DIAGNOSIS — I10 Essential (primary) hypertension: Secondary | ICD-10-CM | POA: Insufficient documentation

## 2023-09-20 DIAGNOSIS — K5730 Diverticulosis of large intestine without perforation or abscess without bleeding: Secondary | ICD-10-CM | POA: Diagnosis not present

## 2023-09-20 DIAGNOSIS — E785 Hyperlipidemia, unspecified: Secondary | ICD-10-CM | POA: Insufficient documentation

## 2023-09-20 DIAGNOSIS — Z8542 Personal history of malignant neoplasm of other parts of uterus: Secondary | ICD-10-CM | POA: Insufficient documentation

## 2023-09-20 DIAGNOSIS — Z9221 Personal history of antineoplastic chemotherapy: Secondary | ICD-10-CM | POA: Insufficient documentation

## 2023-09-20 LAB — CMP (CANCER CENTER ONLY)
ALT: 11 U/L (ref 0–44)
AST: 17 U/L (ref 15–41)
Albumin: 3.7 g/dL (ref 3.5–5.0)
Alkaline Phosphatase: 87 U/L (ref 38–126)
Anion gap: 5 (ref 5–15)
BUN: 11 mg/dL (ref 8–23)
CO2: 29 mmol/L (ref 22–32)
Calcium: 9.1 mg/dL (ref 8.9–10.3)
Chloride: 105 mmol/L (ref 98–111)
Creatinine: 0.93 mg/dL (ref 0.44–1.00)
GFR, Estimated: >60 mL/min (ref >60)
Glucose, Bld: 92 mg/dL (ref 70–99)
Potassium: 3.9 mmol/L (ref 3.5–5.1)
Sodium: 139 mmol/L (ref 135–145)
Total Bilirubin: 0.5 mg/dL (ref 0.3–1.2)
Total Protein: 6.8 g/dL (ref 6.5–8.1)

## 2023-09-20 LAB — CBC WITH DIFFERENTIAL (CANCER CENTER ONLY)
Abs Immature Granulocytes: 0.02 10*3/uL (ref 0.00–0.07)
Basophils Absolute: 0 10*3/uL (ref 0.0–0.1)
Basophils Relative: 1 %
Eosinophils Absolute: 0.1 10*3/uL (ref 0.0–0.5)
Eosinophils Relative: 3 %
HCT: 36.7 % (ref 36.0–46.0)
Hemoglobin: 12.1 g/dL (ref 12.0–15.0)
Immature Granulocytes: 1 %
Lymphocytes Relative: 26 %
Lymphs Abs: 1 10*3/uL (ref 0.7–4.0)
MCH: 25.3 pg — ABNORMAL LOW (ref 26.0–34.0)
MCHC: 33 g/dL (ref 30.0–36.0)
MCV: 76.6 fL — ABNORMAL LOW (ref 80.0–100.0)
Monocytes Absolute: 0.4 10*3/uL (ref 0.1–1.0)
Monocytes Relative: 10 %
Neutro Abs: 2.3 10*3/uL (ref 1.7–7.7)
Neutrophils Relative %: 59 %
Platelet Count: 184 10*3/uL (ref 150–400)
RBC: 4.79 MIL/uL (ref 3.87–5.11)
RDW: 15.6 % — ABNORMAL HIGH (ref 11.5–15.5)
WBC Count: 3.9 10*3/uL — ABNORMAL LOW (ref 4.0–10.5)
nRBC: 0 % (ref 0.0–0.2)

## 2023-09-20 NOTE — Telephone Encounter (Signed)
Tina Stone 12-16-1950 [id number]  Dear Dr. Yetta Barre:  We have scheduled the above named patient for a(n) colonoscopy procedure. Our records show that (s)he is on anticoagulation therapy.  Please advise as to whether the patient may come off their therapy of Eliquis 2 days prior to their procedure which is scheduled for 10/25/2023.  Please route your response to Cristela Felt, CMA or fax response to [phone].  Sincerely,   Cristela Felt, [facility]

## 2023-09-22 ENCOUNTER — Other Ambulatory Visit: Payer: Self-pay | Admitting: Internal Medicine

## 2023-09-22 ENCOUNTER — Encounter: Payer: Self-pay | Admitting: Internal Medicine

## 2023-09-23 DIAGNOSIS — H25813 Combined forms of age-related cataract, bilateral: Secondary | ICD-10-CM | POA: Diagnosis not present

## 2023-09-23 DIAGNOSIS — H04123 Dry eye syndrome of bilateral lacrimal glands: Secondary | ICD-10-CM | POA: Diagnosis not present

## 2023-09-23 DIAGNOSIS — H40013 Open angle with borderline findings, low risk, bilateral: Secondary | ICD-10-CM | POA: Diagnosis not present

## 2023-09-23 DIAGNOSIS — H35033 Hypertensive retinopathy, bilateral: Secondary | ICD-10-CM | POA: Diagnosis not present

## 2023-09-23 NOTE — Telephone Encounter (Signed)
Clearance approval under letter tab. Left message for patient to call office.

## 2023-09-23 NOTE — Telephone Encounter (Signed)
Patient informed. 

## 2023-09-23 NOTE — Progress Notes (Unsigned)
    Aleen Sells D.[facility] [address] [place] [address] Phone: [phone]   Assessment and Plan:     There are no diagnoses linked to this encounter.  ***   Pertinent previous records reviewed include ***   Follow Up: ***     Subjective:   I, Karion Cudd, am serving as a Neurosurgeon for Doctor Richardean Sale  Chief Complaint: Back pain    HPI:    08/23/2023 Patient is a 73 year old female presenting with left lumbar radiculopathy referred by PCP. Patient was seen by PCP on 08/19/23 patient has had an x ray and an MRI for this issue. Today patient states continued pain in the lower back and left hip. Was able to exercise in the pool yesterday. Warm water and pressure from the shower helps. Pain will occasionally radiate into the leg but not beyond the knee. Denies bowel/bladder dysfunction. Denies new injury.   09/24/2023 Patient states    Relevant Historical Information: On chronic anticoagulation with Eliquis, paroxysmal atrial fibrillation, hypertension, GERD, history of endometrial cancer    Additional pertinent review of systems negative.   Current Outpatient Medications:    acetaminophen (TYLENOL) 500 MG tablet, Take 1,000 mg by mouth every 6 (six) hours as needed for moderate pain., Disp: , Rfl:    APPLE CIDER VINEGAR PO, Take 5 mLs by mouth as needed., Disp: , Rfl:    atenolol (TENORMIN) 25 MG tablet, TAKE 1 TABLET (25 MG TOTAL) BY MOUTH DAILY., Disp: 90 tablet, Rfl: 0   Cholecalciferol (VITAMIN D3) 50 MCG (2000 UT) TABS, Take by mouth., Disp: , Rfl:    ELIQUIS 5 MG TABS tablet, TAKE 1 TABLET BY MOUTH TWICE A DAY, Disp: 180 tablet, Rfl: 0   Evolocumab (REPATHA SURECLICK) 140 MG/ML SOAJ, Inject 140 mg into the skin every 14 (fourteen) days., Disp: 2 mL, Rfl: 11   Multiple Vitamin (MULTIVITAMIN) capsule, Take 1 capsule by mouth daily., Disp: , Rfl:    UNITHROID 75 MCG tablet, Take 1 tablet (75 mcg total) by mouth daily  before breakfast., Disp: 90 tablet, Rfl: 0   Objective:     There were no vitals filed for this visit.    There is no height or weight on file to calculate BMI.    Physical Exam:    ***   Electronically signed by:  Aleen Sells D.[facility] 12:01 PM 09/23/23

## 2023-09-24 ENCOUNTER — Ambulatory Visit (INDEPENDENT_AMBULATORY_CARE_PROVIDER_SITE_OTHER): Payer: 59 | Admitting: Sports Medicine

## 2023-09-24 ENCOUNTER — Encounter: Payer: Self-pay | Admitting: Internal Medicine

## 2023-09-24 VITALS — BP 124/82 | Ht 62.0 in | Wt 221.0 lb

## 2023-09-24 DIAGNOSIS — M5442 Lumbago with sciatica, left side: Secondary | ICD-10-CM | POA: Diagnosis not present

## 2023-09-24 DIAGNOSIS — M48062 Spinal stenosis, lumbar region with neurogenic claudication: Secondary | ICD-10-CM | POA: Diagnosis not present

## 2023-09-24 DIAGNOSIS — G8929 Other chronic pain: Secondary | ICD-10-CM | POA: Diagnosis not present

## 2023-09-24 DIAGNOSIS — M5136 Other intervertebral disc degeneration, lumbar region: Secondary | ICD-10-CM | POA: Diagnosis not present

## 2023-09-25 ENCOUNTER — Other Ambulatory Visit: Payer: Medicare Other

## 2023-09-26 DIAGNOSIS — E785 Hyperlipidemia, unspecified: Secondary | ICD-10-CM | POA: Diagnosis not present

## 2023-09-27 ENCOUNTER — Inpatient Hospital Stay (HOSPITAL_BASED_OUTPATIENT_CLINIC_OR_DEPARTMENT_OTHER): Payer: 59 | Admitting: Hematology and Oncology

## 2023-09-27 ENCOUNTER — Encounter: Payer: Self-pay | Admitting: Hematology and Oncology

## 2023-09-27 VITALS — BP 158/91 | HR 68 | Temp 97.7°F | Resp 18 | Ht 62.0 in | Wt 220.8 lb

## 2023-09-27 DIAGNOSIS — I10 Essential (primary) hypertension: Secondary | ICD-10-CM | POA: Diagnosis not present

## 2023-09-27 DIAGNOSIS — Z9221 Personal history of antineoplastic chemotherapy: Secondary | ICD-10-CM | POA: Diagnosis not present

## 2023-09-27 DIAGNOSIS — E785 Hyperlipidemia, unspecified: Secondary | ICD-10-CM | POA: Diagnosis not present

## 2023-09-27 DIAGNOSIS — C541 Malignant neoplasm of endometrium: Secondary | ICD-10-CM | POA: Diagnosis not present

## 2023-09-27 DIAGNOSIS — M1611 Unilateral primary osteoarthritis, right hip: Secondary | ICD-10-CM | POA: Diagnosis not present

## 2023-09-27 DIAGNOSIS — Z8542 Personal history of malignant neoplasm of other parts of uterus: Secondary | ICD-10-CM | POA: Diagnosis present

## 2023-09-27 DIAGNOSIS — M5136 Other intervertebral disc degeneration, lumbar region: Secondary | ICD-10-CM | POA: Diagnosis not present

## 2023-09-27 HISTORY — DX: Morbid (severe) obesity due to excess calories: E66.01

## 2023-09-27 LAB — NMR, LIPOPROFILE
Cholesterol, Total: 196 mg/dL (ref 100–199)
HDL Particle Number: 38.6 umol/L (ref >=30.5)
HDL-C: 77 mg/dL (ref >39)
LDL Particle Number: 1086 nmol/L — ABNORMAL HIGH (ref <1000)
LDL Size: 21.3 nmol (ref >20.5)
LDL-C (NIH Calc): 108 mg/dL — ABNORMAL HIGH (ref 0–99)
LP-IR Score: <25 (ref <=45)
Small LDL Particle Number: 304 nmol/L (ref <=527)
Triglycerides: 61 mg/dL (ref 0–149)

## 2023-09-27 NOTE — Progress Notes (Signed)
[facility] OFFICE PROGRESS NOTE  Patient Care Team: Etta Grandchild, MD as PCP - General (Internal Medicine) Rennis Golden Lisette Abu, MD as PCP - Cardiology (Cardiology) Tyson Alias, MD Diona Foley, MD as Consulting Physician (Ophthalmology)  HISTORY OF PRESENTING ILLNESS: Discussed the use of AI scribe software for clinical note transcription with the patient, who gave verbal consent to proceed.  History of Present Illness   The patient, previously diagnosed with uterine cancer in September 2020, underwent surgery and received treatment chemotherapy and subsequetntly with Assunta Curtis and Keytruda until February 2022. The patient has been off treatment for two and a half years. The patient has had no new symptoms or complaints related to the cancer.  The patient has been experiencing discomfort in the left hip and back, recently diagnosed as arthritis and sciatica. The patient also mentions a problem with the right heel but denies any known injury to the right hip.  The patient's blood pressure has been normal in the past but was slightly elevated during the current visit. The patient attributes this to discomfort and possible anxiety. The patient's total cholesterol level is 201, which the patient perceives as an improvement. The patient is committed to continuing exercise and weight loss efforts. She returns today to review labs and CT imaging results      Assessment and Plan    Uterine Cancer In remission since last treatment with Lenvima and Keytruda in February 2022. Recent CT scan shows no signs of recurrence. -Repeat CT scan and blood work in June 2025.  Degenerative Disc Disease Patient reports back pain and is currently undergoing physical therapy. CT scan shows bone spur and abnormal curvature. -Continue physical therapy and water exercises.  Right Hip Degeneration Radiologist report indicates degenerative changes in the right hip, though patient reports no  pain or discomfort. -No immediate action required unless patient begins to experience pain or discomfort.  Hypertension Blood pressure was elevated during the visit (158/95), but previous readings have been normal. Patient reported discomfort during the visit. -No immediate action required. Monitor blood pressure in future visits.   Hyperlipidemia Total cholesterol is 201, which is slightly elevated but improving. -Continue efforts towards weight loss and maintaining a healthy lifestyle.          Orders Placed This Encounter  Procedures   CT ABDOMEN PELVIS WO CONTRAST    Standing Status:   Future    Standing Expiration Date:   09/26/2024    Order Specific Question:   Preferred imaging location?    Answer:   [facility]    Order Specific Question:   If indicated for the ordered procedure, I authorize the administration of oral contrast media per Radiology protocol    Answer:   Yes    Order Specific Question:   Does the patient have a contrast media/X-ray dye allergy?    Answer:   Yes   CMP (Cancer Center only)    Standing Status:   Future    Standing Expiration Date:   09/26/2024   CBC with Differential (Cancer Center Only)    Standing Status:   Future    Standing Expiration Date:   09/26/2024    All questions were answered. The patient knows to call the clinic with any problems, questions or concerns. The total time spent in the appointment was 30 minutes encounter with patients including review of chart and various tests results, discussions about plan of care and coordination of care plan   Rayetta Veith  Bertis Ruddy, MD 09/27/2023 8:30 AM  REVIEW OF SYSTEMS:   Constitutional: Denies fevers, chills or abnormal weight loss Eyes: Denies blurriness of vision Ears, nose, mouth, throat, and face: Denies mucositis or sore throat Respiratory: Denies cough, dyspnea or wheezes Cardiovascular: Denies palpitation, chest discomfort or lower extremity swelling Gastrointestinal:  Denies  nausea, heartburn or change in bowel habits Skin: Denies abnormal skin rashes Lymphatics: Denies new lymphadenopathy or easy bruising Neurological:Denies numbness, tingling or new weaknesses Behavioral/Psych: Mood is stable, no new changes  All other systems were reviewed with the patient and are negative.  I have reviewed the past medical history, past surgical history, social history and family history with the patient and they are unchanged from previous note.  ALLERGIES:  is allergic to benicar hct [olmesartan medoxomil-hctz], iodinated contrast media, crestor [rosuvastatin], bactrim [sulfamethoxazole-trimethoprim], pravachol [pravastatin], shellfish allergy, and amlodipine.  MEDICATIONS:  Current Outpatient Medications  Medication Sig Dispense Refill   acetaminophen (TYLENOL) 500 MG tablet Take 1,000 mg by mouth every 6 (six) hours as needed for moderate pain.     APPLE CIDER VINEGAR PO Take 5 mLs by mouth as needed.     atenolol (TENORMIN) 25 MG tablet TAKE 1 TABLET (25 MG TOTAL) BY MOUTH DAILY. 90 tablet 0   Cholecalciferol (VITAMIN D3) 50 MCG (2000 UT) TABS Take by mouth.     ELIQUIS 5 MG TABS tablet TAKE 1 TABLET BY MOUTH TWICE A DAY 180 tablet 0   Evolocumab (REPATHA SURECLICK) 140 MG/ML SOAJ Inject 140 mg into the skin every 14 (fourteen) days. 2 mL 11   Multiple Vitamin (MULTIVITAMIN) capsule Take 1 capsule by mouth daily.     UNITHROID 75 MCG tablet Take 1 tablet (75 mcg total) by mouth daily before breakfast. 90 tablet 0   No current facility-administered medications for this visit.    SUMMARY OF ONCOLOGIC HISTORY: Oncology History Overview Note  Poorly differentiated carcinoma, mixed histology with squamous differentiation, rare focus of clear cells as well as serous features MSI stable Her2 negative   Endometrial cancer (HCC)  09/01/2019 Initial Diagnosis   The patient reported a history of postmenopausal bleeding that began 1 to 2 months before diagnosis   09/14/2019  Imaging   US pelvis 1. Enlarged uterus with numerous myometrial masses, presumably fibroids. 2. Endometrial thickness of 6.2 mm. In the setting of post-menopausal bleeding, endometrial sampling is indicated to exclude carcinoma. If results are benign, sonohysterogram should be considered for focal lesion work-up.  3. Nonvisualized ovaries   09/23/2019 Pathology Results   A. ENDOMETRIUM, BIOPSY:  - Poorly differentiated carcinoma   10/15/2019 Imaging   Ct scan of chest, abdomen and pelvis: No evidence of metastatic disease within the chest, abdomen, or pelvis.   Enlarged fibroid uterus.   Colonic diverticulosis. No radiographic evidence of diverticulitis.   Aortic and coronary artery atherosclerosis.     10/22/2019 Pathology Results   SURGICAL PATHOLOGY   FINAL MICROSCOPIC DIAGNOSIS:   A. UTERUS, BILATERAL TUBES AND OVARIES, HYSTERECTOMY:  Poorly differentiated carcinoma, 6.5 cm.  Lymphovascular involvement by tumor.  Carcinoma involves inner half of the myometrium.  Margins not involved.  Cervix, bilateral fallopian tubes and bilateral ovaries free of tumor.   B. LYMPH NODE, RIGHT EXTERNAL SENTINEL, BIOPSY:  One lymph node with no metastatic carcinoma (0/1).   C. LYMPH NODE, RIGHT PELVIC, BIOPSY  Five lymph nodes with no metastatic carcinoma (0/5).   D. LYMPH NODE, LEFT PELVIC, BIOPSY:  Five lymph nodes with no metastatic carcinoma (0/5).   E.  LYMPH NODE, RIGHT PERI AORTIC, BIOPSY:  One lymph node with no metastatic carcinoma (0/1).   F. LYMPH NODE, LEFT PERI AORTIC, BIOPSY:  Five lymph nodes with no metastatic carcinoma (0/5).    ONCOLOGY TABLE:  UTERUS, CARCINOMA OR CARCINOSARCOMA   Procedure: Total hysterectomy with bilateral f-oophorectomy and sentinel  lymph nodes.  Histologic type: Poorly differentiated carcinoma, see comment.  Histologic Grade: High-grade, FIGO 3.  Myometrial invasion:       Depth of invasion: 13 mm       Myometrial thickness: 40 mm   Uterine Serosa Involvement: Not identified  Cervical stromal involvement: Not identified  Extent of involvement of other organs: Not identified  Lymphovascular invasion: Present  Regional Lymph Nodes:       Examined:     17 Sentinel                               0 non-sentinel                               17 total        Lymph nodes with metastasis: 0        Isolated tumor cells (<0.2 mm): 0        Micrometastasis:  (>0.2 mm and < 2.0 mm): 0        Macrometastasis: (>2.0 mm): 0  Representative Tumor Block: A5, A6, A7 and A8.  MMR / MSI testing: Pending  Pathologic Stage Classification (pTNM, AJCC 8th edition):  pT1a, pN0  Comments: The carcinoma is a high-grade poorly differentiated carcinoma which morphologically has predominantly serous features.  There are a few foci with squamous differentiation and a rare focus of clear cell features.  Immunohistochemistry for cytokeratin AE1/AE3 is performed on the sentinel lymph nodes and no positivity is identified.    10/22/2019 Surgery   Pre-operative Diagnosis: endometrial cancer grade 3   Post-operative Diagnosis: same,    Operation: Robotic-assisted laparoscopic total hysterectomy for uterus >250gm with bilateral salpingoophorectomy, SLN mapping, bilateral pelvic and para-aortic lymphadenectomy.   Surgeon: Quinn Axe    Operative Findings:  : 16cm bulky fibroid uterus, normal ovaries bilaterally, no suspicious lymph nodes.     11/10/2019 Cancer Staging   Staging form: Corpus Uteri - Carcinoma and Carcinosarcoma, AJCC 8th Edition - Pathologic: Stage IVB (pT1a, pN0, cM1) - Signed by Artis Delay, MD on 05/16/2020   05/10/2020 Imaging   1. New hypodense 2.0 cm segment 4A left liver lobe mass, suspicious for hepatic metastasis. 2. New left pelvic sidewall 1.4 cm soft tissue nodule, suspicious for left internal iliac nodal metastasis. 3. New left vaginal cuff 1.6 x 1.3 cm soft tissue nodule, suspicious for recurrent tumor. 4.  Aortic Atherosclerosis (ICD10-I70.0).   05/18/2020 Procedure   Successful placement of a right internal jugular approach power injectable Port-A-Cath. The catheter is ready for immediate use.   05/23/2020 -  Chemotherapy   The patient had carboplatin and taxol for chemotherapy treatment.     07/28/2020 Imaging   1. No findings identified to suggest recurrent or metastatic disease. 2. Indeterminate and slightly exophytic lesion arising from the inferior pole of left kidney measures 0.8 cm. Technically this is too small to reliably characterize. Attention on follow-up imaging is advised. 3. Aortic atherosclerosis and LAD coronary artery atherosclerotic calcifications.   11/07/2020 Imaging   1. Interval progression of right pelvic sidewall lymphadenopathy, highly  concerning for disease progression. PET-CT may prove helpful to further evaluate as clinically warranted. 2. Interval resolution of the previously identified hypodense lesion in segment IVA of the liver. 3. Stable 7 mm subcapsular lesion in the lower pole left kidney with attenuation higher than would be expected for a simple cyst but is too small to reliably characterize. Attention on follow-up recommended. 4. Aortic Atherosclerosis (ICD10-I70.0).   11/16/2020 - 01/04/2021 Chemotherapy   She received Rande Lawman plus Lenvima       02/02/2021 Imaging   1. Significant interval decrease in size in right pelvic sidewall, iliac, and inguinal lymph nodes, consistent with treatment response of nodal metastatic disease. 2. Multiple low-attenuation lesions throughout the liver, unchanged compared to prior examination, the majority of these consistent with simple cysts. A previously noted metastatic lesion of anterior hepatic segment VII remains resolved. No new lesions. Findings are consistent with sustained treatment response of hepatic metastatic disease. 3. Status post hysterectomy. Unchanged post treatment appearance of left vaginal cuff and  pelvic sidewall soft tissue, consistent with sustained treatment response. 4. No evidence of metastatic disease in the chest.   Aortic Atherosclerosis (ICD10-I70.0).     05/17/2021 Imaging   Mild right external iliac lymphadenopathy, without significant change. No new or progressive disease within the abdomen or pelvis.   Colonic diverticulosis. No radiographic evidence of diverticulitis.   Aortic Atherosclerosis (ICD10-I70.0).   11/03/2021 Imaging   Stable mild right external iliac lymphadenopathy. No new or progressive disease within the abdomen or pelvis.   Colonic diverticulosis. No radiographic evidence of diverticulitis.   Aortic Atherosclerosis (ICD10-I70.0).     02/05/2022 Imaging   1. Progressive enlargement of an external iliac lymph node on the right, suspicious for metastatic disease from the patient's endometrial cancer. This should be amenable to percutaneous biopsy. 2. No other evidence of progressive metastatic disease. Heterogeneous sclerosis of the proximal right femur is unchanged from recent prior studies, presumably treated metastatic disease. 3. Grossly stable probable cysts within the liver and kidneys. 4. Distal colonic diverticulosis. 5.  Aortic Atherosclerosis (ICD10-I70.0).     06/21/2022 Imaging   1. Interval decrease in size of the right external iliac nodal disease suggesting a good response to treatment. 2. No new or progressive findings. 3. Stable scattered low-attenuation hepatic lesions. No findings suspicious for recurrent hepatic metastatic disease. 4. Status post cholecystectomy. No biliary dilatation. 5. Moderate stool burden suggesting constipation.   Aortic Atherosclerosis (ICD10-I70.0).   12/21/2022 Imaging   1. Interval decreased size of right external iliac lymph node. 2. Stable lytic and sclerotic lesion of the greater trochanter of the right femur. 3. No evidence of new or progressive metastatic disease in the abdomen or pelvis. 4.  Aortic Atherosclerosis (ICD10-I70.0).       01/02/2023 Procedure   Removal of implanted Port-A-Cath utilizing sharp and blunt dissection. The procedure was uncomplicated.   09/20/2023 Imaging   CT ABDOMEN PELVIS WO CONTRAST  Result Date: 09/26/2023 CLINICAL DATA:  Endometrial/cervical cancer restaging. History of left breast lumpectomy. Prior chemotherapy. * Tracking Code: BO * EXAM: CT ABDOMEN AND PELVIS WITHOUT CONTRAST TECHNIQUE: Multidetector CT imaging of the abdomen and pelvis was performed following the standard protocol without IV contrast. RADIATION DOSE REDUCTION: This exam was performed according to the departmental dose-optimization program which includes automated exposure control, adjustment of the mA and/or kV according to patient size and/or use of iterative reconstruction technique. COMPARISON:  04/08/2023 FINDINGS: Lower chest: Postoperative findings, left breast. Mild left anterior descending coronary artery atheromatous vascular disease. Hepatobiliary:  No change in bilateral hypodense hepatic lesions compatible with cysts. Cholecystectomy. Stable mild prominence of the extrahepatic biliary tree, likely a physiologic response to cholecystectomy. Pancreas: Unremarkable Spleen: Unremarkable Adrenals/Urinary Tract: Unremarkable Stomach/Bowel: Sigmoid colon diverticulosis. Vascular/Lymphatic: Atherosclerosis is present, including aortoiliac atherosclerotic disease. No pathologic adenopathy observed. Reproductive: Uterus absent. Adnexa unremarkable. No mass along the vaginal cuff. Other: No supplemental non-categorized findings. Musculoskeletal: Chronic symmetric bilateral sacroiliitis. Chronically stable sclerosis in the right femoral neck and right greater trochanter. Lower thoracic and lumbar spondylosis and degenerative disc disease with grade 1 anterolisthesis of L4 on L5. Suspected multilevel foraminal impingement in the lumbar spine. Grade 1 degenerative retrolisthesis at T12-L1 and L1-2.  IMPRESSION: 1. No findings of active malignancy. 2. Aortic atherosclerosis. 3. Chronic symmetric bilateral sacroiliitis. 4. Chronic sclerosis in the right femoral neck and right greater trochanter. This is been present at least for 3 years and may be from prior trauma or prior radiation necrosis. This would be an unusual metastatic lesion for cervical/endometrial cancer particularly given the nonprogressive nature of the last couple of years. 5. Sigmoid colon diverticulosis. 6. Lower thoracic and lumbar spondylosis and degenerative disc disease with grade 1 anterolisthesis of L4 on L5. Suspected multilevel foraminal impingement in the lumbar spine. Aortic Atherosclerosis (ICD10-I70.0). Electronically Signed   By: Gaylyn Rong M.D.   On: 09/26/2023 15:06   MM 3D DIAGNOSTIC MAMMOGRAM UNILATERAL RIGHT BREAST  Addendum Date: 09/09/2023   ADDENDUM REPORT: 09/09/2023 08:03 BI-RADS CATEGORY: BI-RADS CATEGORY 3: Probably benign. Electronically Signed   By: Harmon Pier M.D.   On: 09/09/2023 08:03   Addendum Date: 09/06/2023   ADDENDUM REPORT: 09/06/2023 12:41 ADDENDUM: The patient returned on 09/06/2023 for stereotactic guided RIGHT breast biopsy of a 0.3 cm UPPER-OUTER RIGHT breast mass without sonographic correlate. Remote prior studies have now been obtained and on further review, this small circumscribed oval mass in the UPPER-OUTER RIGHT breast appears unchanged or slightly decreased in size from 07/31/2019 mammogram. This was discussed with the patient and options of continuing with a biopsy or short-term follow-up were presented. We both agreed to proceed with short-term follow-up as this RIGHT breast mass is likely benign. This stereotactic biopsy was not performed. Recommendation: RIGHT diagnostic mammogram with possible RIGHT breast ultrasound in 6 months, which will be scheduled. Electronically Signed   By: Harmon Pier M.D.   On: 09/06/2023 12:41   Result Date: 09/09/2023 CLINICAL DATA:  73 year old  female presenting as a recall from screening for possible right breast mass. EXAM: DIGITAL DIAGNOSTIC UNILATERAL RIGHT MAMMOGRAM WITH TOMOSYNTHESIS AND CAD; ULTRASOUND RIGHT BREAST LIMITED TECHNIQUE: Right digital diagnostic mammography and breast tomosynthesis was performed. The images were evaluated with computer-aided detection. ; Targeted ultrasound examination of the right breast was performed COMPARISON:  Previous exam(s). ACR Breast Density Category b: There are scattered areas of fibroglandular density. FINDINGS: Mammogram: Right breast: Spot compression tomosynthesis views of the right breast were performed demonstrating persistence of a small oval circumscribed mass measuring approximately 3 cm in the upper outer right breast. Ultrasound: Targeted ultrasound is performed throughout the upper-outer quadrant the right breast demonstrating no definite correlate to the mass identified mammographically. IMPRESSION: Indeterminate mass measuring 0.3 cm in the upper outer right breast without definite sonographic correlate. RECOMMENDATION: Stereotactic core needle biopsy x1 of the right breast. I have discussed the findings and recommendations with the patient. If applicable, a reminder letter will be sent to the patient regarding the next appointment. BI-RADS CATEGORY  4: Suspicious. Electronically Signed: By: Emmaline Kluver M.D. On:  08/30/2023 16:02   [place] LIMITED ULTRASOUND INCLUDING AXILLA RIGHT BREAST  Addendum Date: 09/09/2023   ADDENDUM REPORT: 09/09/2023 08:03 BI-RADS CATEGORY: BI-RADS CATEGORY 3: Probably benign. Electronically Signed   By: Harmon Pier M.D.   On: 09/09/2023 08:03   Addendum Date: 09/06/2023   ADDENDUM REPORT: 09/06/2023 12:41 ADDENDUM: The patient returned on 09/06/2023 for stereotactic guided RIGHT breast biopsy of a 0.3 cm UPPER-OUTER RIGHT breast mass without sonographic correlate. Remote prior studies have now been obtained and on further review, this small circumscribed oval  mass in the UPPER-OUTER RIGHT breast appears unchanged or slightly decreased in size from 07/31/2019 mammogram. This was discussed with the patient and options of continuing with a biopsy or short-term follow-up were presented. We both agreed to proceed with short-term follow-up as this RIGHT breast mass is likely benign. This stereotactic biopsy was not performed. Recommendation: RIGHT diagnostic mammogram with possible RIGHT breast ultrasound in 6 months, which will be scheduled. Electronically Signed   By: Harmon Pier M.D.   On: 09/06/2023 12:41   Result Date: 09/09/2023 CLINICAL DATA:  73 year old female presenting as a recall from screening for possible right breast mass. EXAM: DIGITAL DIAGNOSTIC UNILATERAL RIGHT MAMMOGRAM WITH TOMOSYNTHESIS AND CAD; ULTRASOUND RIGHT BREAST LIMITED TECHNIQUE: Right digital diagnostic mammography and breast tomosynthesis was performed. The images were evaluated with computer-aided detection. ; Targeted ultrasound examination of the right breast was performed COMPARISON:  Previous exam(s). ACR Breast Density Category b: There are scattered areas of fibroglandular density. FINDINGS: Mammogram: Right breast: Spot compression tomosynthesis views of the right breast were performed demonstrating persistence of a small oval circumscribed mass measuring approximately 3 cm in the upper outer right breast. Ultrasound: Targeted ultrasound is performed throughout the upper-outer quadrant the right breast demonstrating no definite correlate to the mass identified mammographically. IMPRESSION: Indeterminate mass measuring 0.3 cm in the upper outer right breast without definite sonographic correlate. RECOMMENDATION: Stereotactic core needle biopsy x1 of the right breast. I have discussed the findings and recommendations with the patient. If applicable, a reminder letter will be sent to the patient regarding the next appointment. BI-RADS CATEGORY  4: Suspicious. Electronically Signed: By:  Emmaline Kluver M.D. On: 08/30/2023 16:02        PHYSICAL EXAMINATION: ECOG PERFORMANCE STATUS: 0 - Asymptomatic  Vitals:   09/27/23 0748  BP: (!) 158/91  Pulse: 68  Resp: 18  Temp: 97.7 F (36.5 C)  SpO2: 100%   Filed Weights   09/27/23 0748  Weight: 220 lb 12.8 oz (100.2 kg)    GENERAL:alert, no distress and comfortable   LABORATORY DATA:  I have reviewed the data as listed    Component Value Date/Time   NA 139 09/20/2023 0733   NA 140 08/16/2023 1159   K 3.9 09/20/2023 0733   CL 105 09/20/2023 0733   CO2 29 09/20/2023 0733   GLUCOSE 92 09/20/2023 0733   BUN 11 09/20/2023 0733   BUN 10 08/16/2023 1159   CREATININE 0.93 09/20/2023 0733   CREATININE 0.81 03/23/2015 0946   CALCIUM 9.1 09/20/2023 0733   PROT 6.8 09/20/2023 0733   PROT 6.6 08/16/2023 1159   ALBUMIN 3.7 09/20/2023 0733   ALBUMIN 4.1 08/16/2023 1159   AST 17 09/20/2023 0733   ALT 11 09/20/2023 0733   ALKPHOS 87 09/20/2023 0733   BILITOT 0.5 09/20/2023 0733   GFRNONAA >60 09/20/2023 0733   GFRNONAA 78 03/23/2015 0946   GFRAA >60 09/12/2020 0853   GFRAA >89 03/23/2015 0946    No  results found for: "SPEP", "UPEP"  Lab Results  Component Value Date   WBC 3.9 (L) 09/20/2023   NEUTROABS 2.3 09/20/2023   HGB 12.1 09/20/2023   HCT 36.7 09/20/2023   MCV 76.6 (L) 09/20/2023   PLT 184 09/20/2023      Chemistry      Component Value Date/Time   NA 139 09/20/2023 0733   NA 140 08/16/2023 1159   K 3.9 09/20/2023 0733   CL 105 09/20/2023 0733   CO2 29 09/20/2023 0733   BUN 11 09/20/2023 0733   BUN 10 08/16/2023 1159   CREATININE 0.93 09/20/2023 0733   CREATININE 0.81 03/23/2015 0946      Component Value Date/Time   CALCIUM 9.1 09/20/2023 0733   ALKPHOS 87 09/20/2023 0733   AST 17 09/20/2023 0733   ALT 11 09/20/2023 0733   BILITOT 0.5 09/20/2023 0733       RADIOGRAPHIC STUDIES:I have reviewed imaging with patient I have personally reviewed the radiological images as listed and  agreed with the findings in the report. CT ABDOMEN PELVIS WO CONTRAST  Result Date: 09/26/2023 CLINICAL DATA:  Endometrial/cervical cancer restaging. History of left breast lumpectomy. Prior chemotherapy. * Tracking Code: BO * EXAM: CT ABDOMEN AND PELVIS WITHOUT CONTRAST TECHNIQUE: Multidetector CT imaging of the abdomen and pelvis was performed following the standard protocol without IV contrast. RADIATION DOSE REDUCTION: This exam was performed according to the departmental dose-optimization program which includes automated exposure control, adjustment of the mA and/or kV according to patient size and/or use of iterative reconstruction technique. COMPARISON:  04/08/2023 FINDINGS: Lower chest: Postoperative findings, left breast. Mild left anterior descending coronary artery atheromatous vascular disease. Hepatobiliary: No change in bilateral hypodense hepatic lesions compatible with cysts. Cholecystectomy. Stable mild prominence of the extrahepatic biliary tree, likely a physiologic response to cholecystectomy. Pancreas: Unremarkable Spleen: Unremarkable Adrenals/Urinary Tract: Unremarkable Stomach/Bowel: Sigmoid colon diverticulosis. Vascular/Lymphatic: Atherosclerosis is present, including aortoiliac atherosclerotic disease. No pathologic adenopathy observed. Reproductive: Uterus absent. Adnexa unremarkable. No mass along the vaginal cuff. Other: No supplemental non-categorized findings. Musculoskeletal: Chronic symmetric bilateral sacroiliitis. Chronically stable sclerosis in the right femoral neck and right greater trochanter. Lower thoracic and lumbar spondylosis and degenerative disc disease with grade 1 anterolisthesis of L4 on L5. Suspected multilevel foraminal impingement in the lumbar spine. Grade 1 degenerative retrolisthesis at T12-L1 and L1-2. IMPRESSION: 1. No findings of active malignancy. 2. Aortic atherosclerosis. 3. Chronic symmetric bilateral sacroiliitis. 4. Chronic sclerosis in the right  femoral neck and right greater trochanter. This is been present at least for 3 years and may be from prior trauma or prior radiation necrosis. This would be an unusual metastatic lesion for cervical/endometrial cancer particularly given the nonprogressive nature of the last couple of years. 5. Sigmoid colon diverticulosis. 6. Lower thoracic and lumbar spondylosis and degenerative disc disease with grade 1 anterolisthesis of L4 on L5. Suspected multilevel foraminal impingement in the lumbar spine. Aortic Atherosclerosis (ICD10-I70.0). Electronically Signed   By: Gaylyn Rong M.D.   On: 09/26/2023 15:06   MM 3D DIAGNOSTIC MAMMOGRAM UNILATERAL RIGHT BREAST  Addendum Date: 09/09/2023   ADDENDUM REPORT: 09/09/2023 08:03 BI-RADS CATEGORY: BI-RADS CATEGORY 3: Probably benign. Electronically Signed   By: Harmon Pier M.D.   On: 09/09/2023 08:03   Addendum Date: 09/06/2023   ADDENDUM REPORT: 09/06/2023 12:41 ADDENDUM: The patient returned on 09/06/2023 for stereotactic guided RIGHT breast biopsy of a 0.3 cm UPPER-OUTER RIGHT breast mass without sonographic correlate. Remote prior studies have now been obtained and on further  review, this small circumscribed oval mass in the UPPER-OUTER RIGHT breast appears unchanged or slightly decreased in size from 07/31/2019 mammogram. This was discussed with the patient and options of continuing with a biopsy or short-term follow-up were presented. We both agreed to proceed with short-term follow-up as this RIGHT breast mass is likely benign. This stereotactic biopsy was not performed. Recommendation: RIGHT diagnostic mammogram with possible RIGHT breast ultrasound in 6 months, which will be scheduled. Electronically Signed   By: Harmon Pier M.D.   On: 09/06/2023 12:41   Result Date: 09/09/2023 CLINICAL DATA:  73 year old female presenting as a recall from screening for possible right breast mass. EXAM: DIGITAL DIAGNOSTIC UNILATERAL RIGHT MAMMOGRAM WITH TOMOSYNTHESIS AND CAD;  ULTRASOUND RIGHT BREAST LIMITED TECHNIQUE: Right digital diagnostic mammography and breast tomosynthesis was performed. The images were evaluated with computer-aided detection. ; Targeted ultrasound examination of the right breast was performed COMPARISON:  Previous exam(s). ACR Breast Density Category b: There are scattered areas of fibroglandular density. FINDINGS: Mammogram: Right breast: Spot compression tomosynthesis views of the right breast were performed demonstrating persistence of a small oval circumscribed mass measuring approximately 3 cm in the upper outer right breast. Ultrasound: Targeted ultrasound is performed throughout the upper-outer quadrant the right breast demonstrating no definite correlate to the mass identified mammographically. IMPRESSION: Indeterminate mass measuring 0.3 cm in the upper outer right breast without definite sonographic correlate. RECOMMENDATION: Stereotactic core needle biopsy x1 of the right breast. I have discussed the findings and recommendations with the patient. If applicable, a reminder letter will be sent to the patient regarding the next appointment. BI-RADS CATEGORY  4: Suspicious. Electronically Signed: By: Emmaline Kluver M.D. On: 08/30/2023 16:02   [place] LIMITED ULTRASOUND INCLUDING AXILLA RIGHT BREAST  Addendum Date: 09/09/2023   ADDENDUM REPORT: 09/09/2023 08:03 BI-RADS CATEGORY: BI-RADS CATEGORY 3: Probably benign. Electronically Signed   By: Harmon Pier M.D.   On: 09/09/2023 08:03   Addendum Date: 09/06/2023   ADDENDUM REPORT: 09/06/2023 12:41 ADDENDUM: The patient returned on 09/06/2023 for stereotactic guided RIGHT breast biopsy of a 0.3 cm UPPER-OUTER RIGHT breast mass without sonographic correlate. Remote prior studies have now been obtained and on further review, this small circumscribed oval mass in the UPPER-OUTER RIGHT breast appears unchanged or slightly decreased in size from 07/31/2019 mammogram. This was discussed with the patient and options  of continuing with a biopsy or short-term follow-up were presented. We both agreed to proceed with short-term follow-up as this RIGHT breast mass is likely benign. This stereotactic biopsy was not performed. Recommendation: RIGHT diagnostic mammogram with possible RIGHT breast ultrasound in 6 months, which will be scheduled. Electronically Signed   By: Harmon Pier M.D.   On: 09/06/2023 12:41   Result Date: 09/09/2023 CLINICAL DATA:  73 year old female presenting as a recall from screening for possible right breast mass. EXAM: DIGITAL DIAGNOSTIC UNILATERAL RIGHT MAMMOGRAM WITH TOMOSYNTHESIS AND CAD; ULTRASOUND RIGHT BREAST LIMITED TECHNIQUE: Right digital diagnostic mammography and breast tomosynthesis was performed. The images were evaluated with computer-aided detection. ; Targeted ultrasound examination of the right breast was performed COMPARISON:  Previous exam(s). ACR Breast Density Category b: There are scattered areas of fibroglandular density. FINDINGS: Mammogram: Right breast: Spot compression tomosynthesis views of the right breast were performed demonstrating persistence of a small oval circumscribed mass measuring approximately 3 cm in the upper outer right breast. Ultrasound: Targeted ultrasound is performed throughout the upper-outer quadrant the right breast demonstrating no definite correlate to the mass identified mammographically. IMPRESSION: Indeterminate mass measuring  0.3 cm in the upper outer right breast without definite sonographic correlate. RECOMMENDATION: Stereotactic core needle biopsy x1 of the right breast. I have discussed the findings and recommendations with the patient. If applicable, a reminder letter will be sent to the patient regarding the next appointment. BI-RADS CATEGORY  4: Suspicious. Electronically Signed: By: Emmaline Kluver M.D. On: 08/30/2023 16:02

## 2023-10-04 ENCOUNTER — Ambulatory Visit (INDEPENDENT_AMBULATORY_CARE_PROVIDER_SITE_OTHER): Payer: 59 | Admitting: Physical Therapy

## 2023-10-04 ENCOUNTER — Encounter: Payer: Self-pay | Admitting: Physical Therapy

## 2023-10-04 DIAGNOSIS — M5459 Other low back pain: Secondary | ICD-10-CM | POA: Diagnosis not present

## 2023-10-04 DIAGNOSIS — M6281 Muscle weakness (generalized): Secondary | ICD-10-CM | POA: Diagnosis not present

## 2023-10-04 NOTE — Therapy (Signed)
OUTPATIENT PHYSICAL THERAPY THORACOLUMBAR TREATMENT    Patient Name: Tina Stone MRN: [id number] DOB:04-07-1950, 73 y.o., female Today's Date: 10/04/2023  END OF SESSION:  PT End of Session - 10/04/23 1021     Visit Number 2    Number of Visits 16    Date for PT Re-Evaluation 11/01/23    Authorization Type UHC- Medicare    PT Start Time [phone]    PT Stop Time 1037    PT Time Calculation (min) 44 min    Activity Tolerance Patient tolerated treatment well    Behavior During Therapy [facility] for tasks assessed/performed               Past Medical History:  Diagnosis Date   Adenomatous colon polyp    Allergic rhinitis, seasonal    Allergy    Atrial fibrillation (HCC)    Beta thalassemia trait 11/25/2013   Cholelithiasis    Class 3 obesity without serious comorbidity with body mass index (BMI) of 40.0 to 44.9 in adult 11/19/2012   DDD (degenerative disc disease), lumbar    endometrial ca dx'd 08/2009   endometrial    GERD (gastroesophageal reflux disease)    H. pylori infection    History of radiation therapy    endometrial - 02/19/2022-03/23/2022 Dr Antony Blackbird   HLD (hyperlipidemia)    Hypercholesterolemia    Hypertension 03/18/2017   no meds    Hypothyroidism    Intraductal papilloma of left breast    Patient underwent left needle-localized lumpectomy by Dr. Wilmon Arms. Tsuei on 09/09/2013; pathology showed intraductal papilloma with no atypia or malignancy identified.   PONV (postoperative nausea and vomiting)    Pre-diabetes    pt denies   Sciatica    Spinal stenosis    Urticaria    Uterine fibroid    Past Surgical History:  Procedure Laterality Date   ANKLE FRACTURE SURGERY Right 1966   right   BREAST EXCISIONAL BIOPSY Left 2014   benign   BREAST LUMPECTOMY WITH NEEDLE LOCALIZATION Left 09/09/2013   Procedure: BREAST LUMPECTOMY WITH NEEDLE LOCALIZATION;  Surgeon: Wilmon Arms. Corliss Skains, MD;  Location: [facility] OR;  Service: General;  Laterality: Left;    CHOLECYSTECTOMY     COLONOSCOPY     IR IMAGING GUIDED PORT INSERTION  05/18/2020   IR REMOVAL TUN ACCESS W/ PORT W/O FL MOD SED  01/02/2023   POLYPECTOMY     ROBOTIC ASSISTED LAPAROSCOPIC CHOLECYSTECTOMY  09/09/2019   ROBOTIC ASSISTED TOTAL HYSTERECTOMY WITH BILATERAL SALPINGO OOPHERECTOMY N/A 10/22/2019   Procedure: XI ROBOTIC ASSISTED TOTAL HYSTERECTOMY WITH BILATERAL SALPINGO OOPHORECTOMY GREATER THAN 250 GRAMS, MINI LAPARTOMY FOR SPECIMEN DELIVERY; PELVIC AND PERI-AORTIC LYMPHADENECTOMY;  Surgeon: Adolphus Birchwood, MD;  Location: [facility] ORS;  Service: Gynecology;  Laterality: N/A;   SENTINEL NODE BIOPSY N/A 10/22/2019   Procedure: SENTINEL NODE BIOPSY;  Surgeon: Adolphus Birchwood, MD;  Location: [facility];  Service: Gynecology;  Laterality: N/A;   Patient Active Problem List   Diagnosis Date Noted   History of colonic polyps 09/13/2023   Left lumbar radiculopathy 08/19/2023   Hyponatremia 08/19/2023   Low mean corpuscular volume (MCV) 08/19/2023   Acute bilateral back pain 08/09/2023   Hematuria 08/09/2023   Gastritis 06/02/2023   Constipation 06/02/2023   Abdominal pain 04/01/2023   Congestion of nasal sinus 02/01/2023   Polyp of colon 01/09/2023   Benign liver cyst 12/21/2022   Right hip pain 08/28/2022   Aortic atherosclerosis (HCC) 08/01/2022   Chronic idiopathic urticaria 11/22/2021   Chronic  anticoagulation 10/02/2021   Visit for screening mammogram 08/18/2021   Acquired hypothyroidism 08/18/2021   Allergic rhinoconjunctivitis 08/18/2021   Encounter for general adult medical examination with abnormal findings 08/18/2021   Hyperlipidemia with target LDL less than 100 08/17/2021   Laryngitis, chronic 08/17/2021   Paroxysmal atrial fibrillation (HCC) 02/03/2021   Secondary hypercoagulable state (HCC) 02/03/2021   Essential hypertension 11/16/2020   Vitamin D deficiency 10/27/2020   Endometrial cancer (HCC) 10/22/2019   Upper respiratory infection, viral 04/01/2018   Acute cough 02/02/2014    Beta thalassemia trait 11/25/2013   Class II obesity 11/19/2012   Gastroesophageal reflux disease 01/28/2007    PCP: Etta Grandchild, MD  REFERRING PROVIDER: Richardean Sale, DO  REFERRING DIAG: Chronic bil low back pain, Spinal stenosis   Rationale for Evaluation and Treatment: Rehabilitation  THERAPY DIAG:  Other low back pain  Muscle weakness (generalized)  ONSET DATE: 2-3 years   SUBJECTIVE:                                                                                                                                                                                            SUBJECTIVE STATEMENT: Pt states back pain has been doing better. She feels she can stand for longer, and has been doing well with exercises in the pool.  Still has pain with going up and down stairs.   Eval: Increased pain for 2-3 years, worsening lately.  She likes to be in water, has been exercising at aquatic center 2- 3d/wk. Not doing formal class. States being in pool helps. She is not doing any regular exercise or specific activities for back.  Increased pain: with walking for a while. Able to Stand/walk about 1 hr.  She is still Taking tylenol daily for pain.    PERTINENT HISTORY:  HTN, , A- Fib, previous cancer.   PAIN:  Are you having pain? Yes: NPRS scale:  4-5/10 Pain location: Bilat lower back  Pain description: sore, constant  Aggravating factors: standing, walking   Relieving factors: sitting, or changing positions    PRECAUTIONS: None  WEIGHT BEARING RESTRICTIONS: No  FALLS:  Has patient fallen in last 6 months? No   PLOF: Independent  PATIENT GOALS: Decreased pain in back.  Continue exercise in pool .   NEXT MD VISIT:   OBJECTIVE:   DIAGNOSTIC FINDINGS:  MRI lumbar:  IMPRESSION: Assessment is limited due to poor signal to noise ratio.   1. Within this limitation, there is multilevel degenerative disc and facet disease, which is most pronounced at L2-L3 where  there is mild to moderate spinal canal narrowing and severe left and  moderate right neural foraminal narrowing. 2. Moderate bilateral neural foraminal narrowing at L3-L4, L4-L5, and L5-S1   PATIENT SURVEYS:  Foto:   COGNITION: Overall cognitive status: Within functional limits for tasks assessed     SENSATION: WFL  POSTURE: No Significant postural limitations  PALPATION: Pain in L low lumbar paraspinals and QL, into L SI.   LUMBAR ROM:  Hips: WFL  Knees: WFL   AROM eval  Flexion WFL  Extension Sig limitation/pain    Right lateral flexion WFL  Left lateral flexion WFL  Right rotation WFL  Left rotation WFL   (Blank rows = not tested)    LOWER EXTREMITY MMT:   Hip flex: 4-/5 bil;  Hip abd: 4/5 bil  Knee flex/ext : 4+/5 bil;   GAIT: Distance walked: 87ft  Assistive device utilized: None Level of assistance: Complete Independence Comments: Normal gait   TODAY'S TREATMENT:                                                                                                                              DATE:   10/04/2023 Therapeutic Exercise: Aerobic: Supine:  Figure 4 piriformis 20 sec x 3 bil; LTR x 15;   SLR 2 x 5 bil; bridging x 5;  Seated: Standing:  Hip abd 2 x 10 bil- education for hep on land and in water.  Stretches:  Seated HSS and piriformis stretch 30 sec x 3 ea bil;  Neuromuscular Re-education: Manual Therapy: Therapeutic Activity: Self Care:   PATIENT EDUCATION:  Education details: updated and reviewed HEP Person educated: Patient Education method: Medical illustrator Education comprehension: verbalized understanding and returned demonstration  HOME EXERCISE PROGRAM: Access Code: [email or web address] URL: [email or web address] Date: 09/06/2023 Prepared by: Sedalia Muta  Exercises - Hooklying Single Knee to Chest Stretch  - 2 x daily - 3 reps - 20 hold - Supine Piriformis Stretch Pulling Heel to Hip  - 2 x daily - 3 reps - 20  hold - Supine Lower Trunk Rotation  - 1-2 x daily - 10 reps - 5-10 sec  hold - Seated Hamstring Stretch  - 2 x daily - 3 reps - 20 hold   ASSESSMENT :  CLINICAL IMPRESSION:  10/04/2023 Pt with decreasing pain in low back. She is doing well with HEP and has been going to the pool. We reviewed initial HEP and added hip strength today. She is very challenged with SLR and bridge in supine, and will continue to benefit from strengthening as tolerated. She also has noted/significant heel supination on R>L with shoes on, not with shoes off. Discussed updating footwear for more neutral foot position.   OBJECTIVE IMPAIRMENTS: decreased activity tolerance, decreased mobility, decreased ROM, decreased strength, increased muscle spasms, improper body mechanics, and pain.    ACTIVITY LIMITATIONS:  lifting, standing, squatting, stairs, transfers, hygiene/grooming, and locomotion level   PARTICIPATION LIMITATIONS: meal prep, cleaning, laundry, shopping, and community activity   PERSONAL FACTORS: Time since onset  are also affecting patient's functional outcome.  REHAB POTENTIAL: Good  CLINICAL DECISION MAKING: Stable/uncomplicated  EVALUATION COMPLEXITY: Low    GOALS: SHORT TERM GOALS: Target date: 09/20/2023  Patient to be independent with initial HEP for land exercises   Goal status: INITIAL    LONG TERM GOALS: Target date: 11/01/2023  Patient to be independent with final HEP  for pool and land exercises   Goal status: INITIAL  2.  Patient to demo improved strength of bil hips to at least 4/5   to improve stability and pain  Goal status: INITIAL  3. Pt to demo improved back pain to 0-4/10 with activity.    Goal status: INITIAL   4.  Patient to demo ability for bend, squat, with optimal mechanics, to improve ability for IADLs.   Goal status: INITIAL     PLAN: PT FREQUENCY:  1-2  x per week   PT DURATION: 8 weeks  PLANNED INTERVENTIONS  aquatic PT, Canalith  repositioning, cryotherapy, Electrical stimulation, Iontophoresis with 4 mg/ml dexamethasome, Moist heat, traction, Ultrasound, gait training, Therapeutic exercise, balance training, neuromuscular re-education, patient/family education, prosthetic training, manual techniques, passive ROM, dry needling, taping, vasopnuematic device, vestibular, spinal manipulations, joint manipulations   PLAN FOR NEXT SESSION:  Flexion mobility, educate on HEP, TA , core and hip strength,  review pool program   Sedalia Muta, PT, DPT 10:42 AM  10/04/23

## 2023-10-09 ENCOUNTER — Other Ambulatory Visit: Payer: Self-pay | Admitting: Internal Medicine

## 2023-10-09 DIAGNOSIS — D6869 Other thrombophilia: Secondary | ICD-10-CM

## 2023-10-09 DIAGNOSIS — I480 Paroxysmal atrial fibrillation: Secondary | ICD-10-CM

## 2023-10-10 ENCOUNTER — Other Ambulatory Visit: Payer: Self-pay | Admitting: Internal Medicine

## 2023-10-10 DIAGNOSIS — I480 Paroxysmal atrial fibrillation: Secondary | ICD-10-CM

## 2023-10-10 DIAGNOSIS — D6869 Other thrombophilia: Secondary | ICD-10-CM

## 2023-10-11 ENCOUNTER — Encounter: Payer: 59 | Admitting: Physical Therapy

## 2023-10-11 NOTE — Telephone Encounter (Signed)
Patient made an appointment for 11/11/2023 - she wants to know if you can send in enough of this medication to get her to the appointment.

## 2023-10-13 ENCOUNTER — Other Ambulatory Visit: Payer: Self-pay | Admitting: Internal Medicine

## 2023-10-13 DIAGNOSIS — E039 Hypothyroidism, unspecified: Secondary | ICD-10-CM

## 2023-10-14 ENCOUNTER — Other Ambulatory Visit: Payer: Self-pay | Admitting: Internal Medicine

## 2023-10-14 ENCOUNTER — Encounter: Payer: Self-pay | Admitting: Gastroenterology

## 2023-10-14 DIAGNOSIS — D6869 Other thrombophilia: Secondary | ICD-10-CM

## 2023-10-14 DIAGNOSIS — I480 Paroxysmal atrial fibrillation: Secondary | ICD-10-CM

## 2023-10-18 ENCOUNTER — Ambulatory Visit (INDEPENDENT_AMBULATORY_CARE_PROVIDER_SITE_OTHER): Payer: 59 | Admitting: Physical Therapy

## 2023-10-18 ENCOUNTER — Encounter: Payer: Self-pay | Admitting: Physical Therapy

## 2023-10-18 DIAGNOSIS — M5459 Other low back pain: Secondary | ICD-10-CM

## 2023-10-18 DIAGNOSIS — M6281 Muscle weakness (generalized): Secondary | ICD-10-CM

## 2023-10-18 NOTE — Therapy (Signed)
OUTPATIENT PHYSICAL THERAPY THORACOLUMBAR TREATMENT    Patient Name: Tina Stone MRN: [id number] DOB:06/23/1950, 73 y.o., female Today's Date: 10/18/2023  END OF SESSION:  PT End of Session - 10/18/23 1102     Visit Number 3    Number of Visits 16    Date for PT Re-Evaluation 11/01/23    Authorization Type UHC- Medicare    PT Start Time 1016    PT Stop Time 1059    PT Time Calculation (min) 43 min    Activity Tolerance Patient tolerated treatment well    Behavior During Therapy WFL for tasks assessed/performed                Past Medical History:  Diagnosis Date   Adenomatous colon polyp    Allergic rhinitis, seasonal    Allergy    Atrial fibrillation (HCC)    Beta thalassemia trait 11/25/2013   Cholelithiasis    Class 3 obesity without serious comorbidity with body mass index (BMI) of 40.0 to 44.9 in adult 11/19/2012   DDD (degenerative disc disease), lumbar    endometrial ca dx'd 08/2009   endometrial    GERD (gastroesophageal reflux disease)    H. pylori infection    History of radiation therapy    endometrial - 02/19/2022-03/23/2022 Dr Antony Blackbird   HLD (hyperlipidemia)    Hypercholesterolemia    Hypertension 03/18/2017   no meds    Hypothyroidism    Intraductal papilloma of left breast    Patient underwent left needle-localized lumpectomy by Dr. Wilmon Arms. Tsuei on 09/09/2013; pathology showed intraductal papilloma with no atypia or malignancy identified.   PONV (postoperative nausea and vomiting)    Pre-diabetes    pt denies   Sciatica    Spinal stenosis    Urticaria    Uterine fibroid    Past Surgical History:  Procedure Laterality Date   ANKLE FRACTURE SURGERY Right 1966   right   BREAST EXCISIONAL BIOPSY Left 2014   benign   BREAST LUMPECTOMY WITH NEEDLE LOCALIZATION Left 09/09/2013   Procedure: BREAST LUMPECTOMY WITH NEEDLE LOCALIZATION;  Surgeon: Wilmon Arms. Corliss Skains, MD;  Location: [facility];  Service: General;  Laterality: Left;    CHOLECYSTECTOMY     COLONOSCOPY     IR IMAGING GUIDED PORT INSERTION  05/18/2020   IR REMOVAL TUN ACCESS W/ PORT W/O FL MOD SED  01/02/2023   POLYPECTOMY     ROBOTIC ASSISTED LAPAROSCOPIC CHOLECYSTECTOMY  09/09/2019   ROBOTIC ASSISTED TOTAL HYSTERECTOMY WITH BILATERAL SALPINGO OOPHERECTOMY N/A 10/22/2019   Procedure: XI ROBOTIC ASSISTED TOTAL HYSTERECTOMY WITH BILATERAL SALPINGO OOPHORECTOMY GREATER THAN 250 GRAMS, MINI LAPARTOMY FOR SPECIMEN DELIVERY; PELVIC AND PERI-AORTIC LYMPHADENECTOMY;  Surgeon: Adolphus Birchwood, MD;  Location: [facility];  Service: Gynecology;  Laterality: N/A;   SENTINEL NODE BIOPSY N/A 10/22/2019   Procedure: SENTINEL NODE BIOPSY;  Surgeon: Adolphus Birchwood, MD;  Location: [facility];  Service: Gynecology;  Laterality: N/A;   Patient Active Problem List   Diagnosis Date Noted   History of colonic polyps 09/13/2023   Left lumbar radiculopathy 08/19/2023   Hyponatremia 08/19/2023   Low mean corpuscular volume (MCV) 08/19/2023   Acute bilateral back pain 08/09/2023   Hematuria 08/09/2023   Gastritis 06/02/2023   Constipation 06/02/2023   Abdominal pain 04/01/2023   Congestion of nasal sinus 02/01/2023   Polyp of colon 01/09/2023   Benign liver cyst 12/21/2022   Right hip pain 08/28/2022   Aortic atherosclerosis (HCC) 08/01/2022   Chronic idiopathic urticaria 11/22/2021  Chronic anticoagulation 10/02/2021   Visit for screening mammogram 08/18/2021   Acquired hypothyroidism 08/18/2021   Allergic rhinoconjunctivitis 08/18/2021   Encounter for general adult medical examination with abnormal findings 08/18/2021   Hyperlipidemia with target LDL less than 100 08/17/2021   Laryngitis, chronic 08/17/2021   Paroxysmal atrial fibrillation (HCC) 02/03/2021   Secondary hypercoagulable state (HCC) 02/03/2021   Essential hypertension 11/16/2020   Vitamin D deficiency 10/27/2020   Endometrial cancer (HCC) 10/22/2019   Upper respiratory infection, viral 04/01/2018   Acute cough 02/02/2014    Beta thalassemia trait 11/25/2013   Class II obesity 11/19/2012   Gastroesophageal reflux disease 01/28/2007    PCP: Etta Grandchild, MD  REFERRING PROVIDER: Richardean Sale, DO  REFERRING DIAG: Chronic bil low back pain, Spinal stenosis   Rationale for Evaluation and Treatment: Rehabilitation  THERAPY DIAG:  Other low back pain  Muscle weakness (generalized)  ONSET DATE: 2-3 years   SUBJECTIVE:                                                                                                                                                                                            SUBJECTIVE STATEMENT: Pt states increased pain in the last couple days, thinks because the weather got cold. But overall states she is doing very well. She has been going to the pool and doing HEP. Feels back is much improved.   Eval: Increased pain for 2-3 years, worsening lately.  She likes to be in water, has been exercising at aquatic center 2- 3d/wk. Not doing formal class. States being in pool helps. She is not doing any regular exercise or specific activities for back.  Increased pain: with walking for a while. Able to Stand/walk about 1 hr.  She is still Taking tylenol daily for pain.    PERTINENT HISTORY:  HTN, , A- Fib, previous cancer.   PAIN:  Are you having pain? Yes: NPRS scale:  4-5/10 Pain location: Bilat lower back  Pain description: sore, constant  Aggravating factors: standing, walking   Relieving factors: sitting, or changing positions    PRECAUTIONS: None  WEIGHT BEARING RESTRICTIONS: No  FALLS:  Has patient fallen in last 6 months? No   PLOF: Independent  PATIENT GOALS: Decreased pain in back.  Continue exercise in pool .   NEXT MD VISIT:   OBJECTIVE:   DIAGNOSTIC FINDINGS:  MRI lumbar:  IMPRESSION: Assessment is limited due to poor signal to noise ratio.   1. Within this limitation, there is multilevel degenerative disc and facet disease, which is most  pronounced at L2-L3 where there is mild to moderate spinal canal narrowing  and severe left and moderate right neural foraminal narrowing. 2. Moderate bilateral neural foraminal narrowing at L3-L4, L4-L5, and L5-S1   PATIENT SURVEYS:  Foto:      Visit 3:   72.6  COGNITION: Overall cognitive status: Within functional limits for tasks assessed     SENSATION: WFL  POSTURE: No Significant postural limitations  PALPATION: Pain in L low lumbar paraspinals and QL, into L SI.   LUMBAR ROM:  Hips: WFL  Knees: WFL   AROM eval  Flexion WFL  Extension Sig limitation/pain    Right lateral flexion WFL  Left lateral flexion WFL  Right rotation WFL  Left rotation WFL   (Blank rows = not tested)    LOWER EXTREMITY MMT:   Hip flex: 4-/5 bil;  Hip abd: 4/5 bil  Knee flex/ext : 4+/5 bil;   10/18: Hip flex: 4/5 bil;  Hip abd: 4/5 bil  Knee flex/ext : 4+/5 bil;     GAIT: Distance walked: 59ft  Assistive device utilized: None Level of assistance: Complete Independence Comments: Normal gait   TODAY'S TREATMENT:                                                                                                                              DATE:   10/18/2023 Therapeutic Exercise: Aerobic: Supine:  Figure 4 piriformis 20 sec x 3 bil; LTR x 15;   SLR 2 x 10 bil; bridging 2 x 5 ;  Seated: Standing:  Hip abd 2 x 10 bil ; step ups 4 and 6 in x 10 ea bil on both, with no UE support, up/down 5 steps with 1 light UE support x 6;  Stretches:   Neuromuscular Re-education: Manual Therapy: Therapeutic Activity: Self Care:   Previous:  Therapeutic Exercise: Aerobic: Supine:  Figure 4 piriformis 20 sec x 3 bil; LTR x 15;   SLR 2 x 5 bil; bridging x 5;  Seated: Standing:  Hip abd 2 x 10 bil- education for hep on land and in water.  Stretches:  Seated HSS and piriformis stretch 30 sec x 3 ea bil;  Neuromuscular Re-education: Manual Therapy: Therapeutic Activity: Self  Care:   PATIENT EDUCATION:  Education details: updated and reviewed HEP Person educated: Patient Education method: Explanation and Demonstration Education comprehension: verbalized understanding and returned demonstration  HOME EXERCISE PROGRAM: Access Code: [email or web address]   ASSESSMENT :  CLINICAL IMPRESSION:  10/18/2023 Pt reports doing well with her back pain. She requests to continue HEP at home. She is having little pain, and thinks she is doing much better overall. Continued to review importance of strengthening for core and hips for back pain. She does have improved strength in hip abd and flexion with testing today.  She is doing well going to aquatic center and doing HEP. We reviewed HEP today. Pt ready for d/c to HEP.   OBJECTIVE IMPAIRMENTS: decreased activity tolerance, decreased mobility, decreased ROM, decreased strength, increased muscle spasms, improper body  mechanics, and pain.    ACTIVITY LIMITATIONS:  lifting, standing, squatting, stairs, transfers, hygiene/grooming, and locomotion level   PARTICIPATION LIMITATIONS: meal prep, cleaning, laundry, shopping, and community activity   PERSONAL FACTORS: Time since onset    are also affecting patient's functional outcome.  REHAB POTENTIAL: Good  CLINICAL DECISION MAKING: Stable/uncomplicated  EVALUATION COMPLEXITY: Low    GOALS: SHORT TERM GOALS: Target date: 09/20/2023  Patient to be independent with initial HEP for land exercises   Goal status: MET    LONG TERM GOALS: Target date: 11/01/2023  Patient to be independent with final HEP  for pool and land exercises    Goal status: MET  2.  Patient to demo improved strength of bil hips to at least 4/5   to improve stability and pain   Goal status: MET  3. Pt to demo improved back pain to 0-4/10 with activity.    Goal status: MET   4.  Patient to demo ability for bend, squat, with optimal mechanics, to improve ability for IADLs.    Goal status:  MET     PLAN: PT FREQUENCY:  1-2  x per week   PT DURATION: 8 weeks  PLANNED INTERVENTIONS  aquatic PT, Canalith repositioning, cryotherapy, Electrical stimulation, Iontophoresis with 4 mg/ml dexamethasome, Moist heat, traction, Ultrasound, gait training, Therapeutic exercise, balance training, neuromuscular re-education, patient/family education, prosthetic training, manual techniques, passive ROM, dry needling, taping, vasopnuematic device, vestibular, spinal manipulations, joint manipulations   PLAN FOR NEXT SESSION:    Sedalia Muta, PT, DPT 11:13 AM  10/18/23  PHYSICAL THERAPY DISCHARGE SUMMARY  Visits from Start of Care: 3   Plan: Patient agrees to discharge.  Patient goals were met. Patient is being discharged due to meeting the stated rehab goals.     Sedalia Muta, PT, DPT 11:19 AM  10/18/23

## 2023-10-20 ENCOUNTER — Encounter: Payer: Self-pay | Admitting: Certified Registered Nurse Anesthetist

## 2023-10-22 ENCOUNTER — Other Ambulatory Visit: Payer: Self-pay | Admitting: Internal Medicine

## 2023-10-22 DIAGNOSIS — I480 Paroxysmal atrial fibrillation: Secondary | ICD-10-CM

## 2023-10-22 DIAGNOSIS — I10 Essential (primary) hypertension: Secondary | ICD-10-CM

## 2023-10-22 DIAGNOSIS — D6869 Other thrombophilia: Secondary | ICD-10-CM

## 2023-10-24 ENCOUNTER — Other Ambulatory Visit: Payer: Self-pay | Admitting: Internal Medicine

## 2023-10-24 DIAGNOSIS — E039 Hypothyroidism, unspecified: Secondary | ICD-10-CM

## 2023-10-24 DIAGNOSIS — I10 Essential (primary) hypertension: Secondary | ICD-10-CM

## 2023-10-24 DIAGNOSIS — I480 Paroxysmal atrial fibrillation: Secondary | ICD-10-CM

## 2023-10-25 ENCOUNTER — Encounter: Payer: Self-pay | Admitting: Gastroenterology

## 2023-10-25 ENCOUNTER — Encounter: Payer: 59 | Admitting: Physical Therapy

## 2023-10-25 ENCOUNTER — Ambulatory Visit (AMBULATORY_SURGERY_CENTER): Payer: 59 | Admitting: Gastroenterology

## 2023-10-25 VITALS — BP 112/56 | HR 58 | Temp 98.6°F | Resp 13 | Ht 62.0 in | Wt 221.0 lb

## 2023-10-25 DIAGNOSIS — Z09 Encounter for follow-up examination after completed treatment for conditions other than malignant neoplasm: Secondary | ICD-10-CM | POA: Diagnosis not present

## 2023-10-25 DIAGNOSIS — Z860101 Personal history of adenomatous and serrated colon polyps: Secondary | ICD-10-CM

## 2023-10-25 DIAGNOSIS — Z1211 Encounter for screening for malignant neoplasm of colon: Secondary | ICD-10-CM | POA: Diagnosis not present

## 2023-10-25 DIAGNOSIS — Z860100 Personal history of colon polyps, unspecified: Secondary | ICD-10-CM

## 2023-10-25 MED ORDER — SODIUM CHLORIDE 0.9 % IV SOLN: 500.0000 mL | Freq: Once | INTRAVENOUS | Status: DC

## 2023-10-25 NOTE — Progress Notes (Signed)
[facility] History and Physical   Primary Care Physician:  Etta Grandchild, MD   Reason for Procedure:  History of adenomatous colon polyps  Plan:    Surveillance colonoscopy with possible interventions as needed     HPI: Tina Stone is a very pleasant 73 y.o. female here for surveillance colonoscopy. Denies any nausea, vomiting, abdominal pain, melena or bright red blood per rectum  The risks and benefits as well as alternatives of endoscopic procedure(s) have been discussed and reviewed. All questions answered. The patient agrees to proceed.    Past Medical History:  Diagnosis Date   Adenomatous colon polyp    Allergic rhinitis, seasonal    Allergy    Atrial fibrillation (HCC)    Beta thalassemia trait 11/25/2013   Cholelithiasis    Class 3 obesity without serious comorbidity with body mass index (BMI) of 40.0 to 44.9 in adult 11/19/2012   DDD (degenerative disc disease), lumbar    endometrial ca dx'd 08/2009   endometrial    GERD (gastroesophageal reflux disease)    H. pylori infection    History of radiation therapy    endometrial - 02/19/2022-03/23/2022 Dr Antony Blackbird   HLD (hyperlipidemia)    Hypercholesterolemia    Hypertension 03/18/2017   no meds    Hypothyroidism    Intraductal papilloma of left breast    Patient underwent left needle-localized lumpectomy by Dr. Wilmon Arms. Tsuei on 09/09/2013; pathology showed intraductal papilloma with no atypia or malignancy identified.   Morbid (severe) obesity due to excess calories (HCC) 09/27/2023   bmi 40.38   PONV (postoperative nausea and vomiting)    Pre-diabetes    pt denies   Sciatica    Spinal stenosis    Urticaria    Uterine fibroid     Past Surgical History:  Procedure Laterality Date   ANKLE FRACTURE SURGERY Right 1966   right   BREAST EXCISIONAL BIOPSY Left 2014   benign   BREAST LUMPECTOMY WITH NEEDLE LOCALIZATION Left 09/09/2013   Procedure: BREAST LUMPECTOMY WITH NEEDLE  LOCALIZATION;  Surgeon: Wilmon Arms. Corliss Skains, MD;  Location: [facility];  Service: General;  Laterality: Left;   CHOLECYSTECTOMY     COLONOSCOPY     IR IMAGING GUIDED PORT INSERTION  05/18/2020   IR REMOVAL TUN ACCESS W/ PORT W/O FL MOD SED  01/02/2023   POLYPECTOMY     ROBOTIC ASSISTED LAPAROSCOPIC CHOLECYSTECTOMY  09/09/2019   ROBOTIC ASSISTED TOTAL HYSTERECTOMY WITH BILATERAL SALPINGO OOPHERECTOMY N/A 10/22/2019   Procedure: XI ROBOTIC ASSISTED TOTAL HYSTERECTOMY WITH BILATERAL SALPINGO OOPHORECTOMY GREATER THAN 250 GRAMS, MINI LAPARTOMY FOR SPECIMEN DELIVERY; PELVIC AND PERI-AORTIC LYMPHADENECTOMY;  Surgeon: Adolphus Birchwood, MD;  Location: [facility];  Service: Gynecology;  Laterality: N/A;   SENTINEL NODE BIOPSY N/A 10/22/2019   Procedure: SENTINEL NODE BIOPSY;  Surgeon: Adolphus Birchwood, MD;  Location: [facility];  Service: Gynecology;  Laterality: N/A;    Prior to Admission medications   Medication Sig Start Date End Date Taking? Authorizing Provider  atenolol (TENORMIN) 25 MG tablet TAKE 1 TABLET (25 MG TOTAL) BY MOUTH DAILY. 10/24/23  Yes Etta Grandchild, MD  Cholecalciferol (VITAMIN D3) 50 MCG (2000 UT) TABS Take by mouth.   Yes [provider]  levothyroxine (SYNTHROID) 75 MCG tablet TAKE 1 TABLET BY MOUTH EVERY DAY BEFORE BREAKFAST 10/24/23  Yes Etta Grandchild, MD  Multiple Vitamin (MULTIVITAMIN) capsule Take 1 capsule by mouth daily.   Yes [provider]  NON FORMULARY Herbal Ginger and lemon tea  Yes [provider]  zinc gluconate 50 MG tablet Take 50 mg by mouth daily.   Yes [provider]  acetaminophen (TYLENOL) 500 MG tablet Take 1,000 mg by mouth every 6 (six) hours as needed for moderate pain.    [provider]  APPLE CIDER VINEGAR PO Take 5 mLs by mouth as needed.    [provider]  ELIQUIS 5 MG TABS tablet TAKE 1 TABLET BY MOUTH TWICE A DAY 10/22/23   Etta Grandchild, MD  Evolocumab (REPATHA SURECLICK) 140 MG/ML SOAJ Inject 140 mg into  the skin every 14 (fourteen) days. 05/09/23   Chrystie Nose, MD    Current Outpatient Medications  Medication Sig Dispense Refill   atenolol (TENORMIN) 25 MG tablet TAKE 1 TABLET (25 MG TOTAL) BY MOUTH DAILY. 30 tablet 0   Cholecalciferol (VITAMIN D3) 50 MCG (2000 UT) TABS Take by mouth.     levothyroxine (SYNTHROID) 75 MCG tablet TAKE 1 TABLET BY MOUTH EVERY DAY BEFORE BREAKFAST 30 tablet 0   Multiple Vitamin (MULTIVITAMIN) capsule Take 1 capsule by mouth daily.     NON FORMULARY Herbal Ginger and lemon tea     zinc gluconate 50 MG tablet Take 50 mg by mouth daily.     acetaminophen (TYLENOL) 500 MG tablet Take 1,000 mg by mouth every 6 (six) hours as needed for moderate pain.     APPLE CIDER VINEGAR PO Take 5 mLs by mouth as needed.     ELIQUIS 5 MG TABS tablet TAKE 1 TABLET BY MOUTH TWICE A DAY 60 tablet 0   Evolocumab (REPATHA SURECLICK) 140 MG/ML SOAJ Inject 140 mg into the skin every 14 (fourteen) days. 2 mL 11   Current Facility-Administered Medications  Medication Dose Route Frequency Provider Last Rate Last Admin   0.9 %  sodium chloride infusion  500 mL Intravenous Once Napoleon Form, MD        Allergies as of 10/25/2023 - Review Complete 10/25/2023  Allergen Reaction Noted   Benicar hct [olmesartan medoxomil-hctz] Shortness Of Breath and Palpitations 03/05/2019   Iodinated contrast media Hives and Itching 05/10/2020   Crestor [rosuvastatin] Other (See Comments) 07/09/2022   Bactrim [sulfamethoxazole-trimethoprim] Other (See Comments) 02/11/2015   Pravachol [pravastatin] Other (See Comments) 03/23/2015   Shellfish allergy Nausea And Vomiting 08/23/2018   Amlodipine Palpitations 02/25/2019    Family History  Problem Relation Age of Onset   Diabetes Mother    Hypertension Mother    Colon polyps Mother 31   Dementia Mother 90   Diabetes Father    Congestive Heart Failure Father    Asthma Brother    Diabetes Brother    Hypertension Brother    Hypertension  Brother    Hypertension Maternal Grandmother    Diabetes Maternal Grandmother    Asthma Paternal Grandmother    Lung disease Paternal Grandmother    Diabetes Paternal Grandfather    Pancreatic cancer Paternal Aunt    Uterine cancer Paternal Aunt    Colon cancer Neg Hx    Breast cancer Neg Hx    Lung cancer Neg Hx    Esophageal cancer Neg Hx    Rectal cancer Neg Hx    Stomach cancer Neg Hx     Social History   Socioeconomic History   Marital status: Widowed    Spouse name: Not on file   Number of children: 0   Years of education: Not on file   Highest education level: Bachelor's degree (e.g., BA, AB,  BS)  Occupational History   Occupation: Geographical information systems officer  Tobacco Use   Smoking status: Never   Smokeless tobacco: Never   Tobacco comments:    few puffs but not a true smoker quit many yrs ago  Vaping Use   Vaping status: Never Used  Substance and Sexual Activity   Alcohol use: Never   Drug use: Never   Sexual activity: Not Currently  Other Topics Concern   Not on file  Social History Narrative   ** Merged History Encounter **       Lives in [place], widowed 2003   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         Social Determinants of Health   Financial Resource Strain: Medium Risk (05/20/2023)   Overall Financial Resource Strain (CARDIA)    Difficulty of Paying Living Expenses: Somewhat hard  Food Insecurity: Food Insecurity Present (05/20/2023)   Hunger Vital Sign    Worried About Running Out of Food in the Last Year: Never true    Ran Out of Food in the Last Year: Sometimes true  Transportation Needs: No Transportation Needs (05/20/2023)   PRAPARE - Administrator, Civil Service (Medical): No    Lack of Transportation (Non-Medical): No  Physical Activity: Sufficiently Active (05/20/2023)   Exercise Vital Sign    Days of Exercise per Week: 3 days    Minutes of Exercise per Session: 70 min  Stress: No Stress Concern Present (05/20/2023)   [employer] of Occupational Health - Occupational Stress Questionnaire    Feeling of Stress : Only a little  Social Connections: Moderately Integrated (05/20/2023)   Social Connection and Isolation Panel [NHANES]    Frequency of Communication with Friends and Family: More than three times a week    Frequency of Social Gatherings with Friends and Family: More than three times a week    Attends Religious Services: More than 4 times per year    Active Member of [employer] or Organizations: Yes    Attends Banker Meetings: More than 4 times per year    Marital Status: Widowed  Intimate Partner Violence: Not At Risk (05/20/2023)   Humiliation, Afraid, Rape, and Kick questionnaire    Fear of Current or Ex-Partner: No    Emotionally Abused: No    Physically Abused: No    Sexually Abused: No    Review of Systems:  All other review of systems negative except as mentioned in the HPI.  Physical Exam: Vital signs in last 24 hours: BP (!) 135/95   Pulse 76   Temp 98.6 F (37 C) (Temporal)   Ht 5\' 2"  (1.575 m)   Wt 221 lb (100.2 kg)   SpO2 98%   BMI 40.42 kg/m  General:   Alert, NAD Lungs:  Clear .   Heart:  Regular rate and rhythm Abdomen:  Soft, nontender and nondistended. Neuro/Psych:  Alert and cooperative. Normal mood and affect. A and O x 3  Reviewed labs, radiology imaging, old records and pertinent past GI work up  Patient is appropriate for planned procedure(s) and anesthesia in an ambulatory setting   K. Scherry Ran , MD [phone]

## 2023-10-25 NOTE — Patient Instructions (Signed)
Resume Eliquis today at previous dose.  Resume all of your previous medications today as ordered.  Read the handouts given to you by your recovery room nurse.  Read your discharge instructions.  YOU HAD AN ENDOSCOPIC PROCEDURE TODAY AT THE [facility]:   Refer to the procedure report that was given to you for any specific questions about what was found during the examination.  If the procedure report does not answer your questions, please call your gastroenterologist to clarify.  If you requested that your care partner not be given the details of your procedure findings, then the procedure report has been included in a sealed envelope for you to review at your convenience later.  YOU SHOULD EXPECT: Some feelings of bloating in the abdomen. Passage of more gas than usual.  Walking can help get rid of the air that was put into your GI tract during the procedure and reduce the bloating. If you had a lower endoscopy (such as a colonoscopy or flexible sigmoidoscopy) you may notice spotting of blood in your stool or on the toilet paper. If you underwent a bowel prep for your procedure, you may not have a normal bowel movement for a few days.  Please Note:  You might notice some irritation and congestion in your nose or some drainage.  This is from the oxygen used during your procedure.  There is no need for concern and it should clear up in a day or so.  SYMPTOMS TO REPORT IMMEDIATELY:  Following lower endoscopy (colonoscopy or flexible sigmoidoscopy):  Excessive amounts of blood in the stool  Significant tenderness or worsening of abdominal pains  Swelling of the abdomen that is new, acute  Fever of 100F or higher  For urgent or emergent issues, a gastroenterologist can be reached at any hour by calling [phone]. Do not use MyChart messaging for urgent concerns.    DIET:  We do recommend a small meal at first, but then you may proceed to your regular diet.  Drink plenty of fluids  but you should avoid alcoholic beverages for 24 hours.  ACTIVITY:  You should plan to take it easy for the rest of today and you should NOT DRIVE or use heavy machinery until tomorrow (because of the sedation medicines used during the test).    FOLLOW UP: Our staff will call the number listed on your records the next business day following your procedure.  We will call around 7:15- 8:00 am to check on you and address any questions or concerns that you may have regarding the information given to you following your procedure. If we do not reach you, we will leave a message.       SIGNATURES/CONFIDENTIALITY: You and/or your care partner have signed paperwork which will be entered into your electronic medical record.  These signatures attest to the fact that that the information above on your After Visit Summary has been reviewed and is understood.  Full responsibility of the confidentiality of this discharge information lies with you and/or your care-partner.

## 2023-10-25 NOTE — Op Note (Signed)
[facility] Patient Name: Tina Stone Procedure Date: 10/25/2023 3:03 PM MRN: [id number] Endoscopist: Napoleon Form , MD, [id number] Age: 73 Referring MD:  Date of Birth: 02-Feb-1950 Gender: Female Account #: [id number] Procedure:                Colonoscopy Indications:              High risk colon cancer surveillance: Personal                            history of colonic polyps, High risk colon cancer                            surveillance: Personal history of adenoma less than                            10 mm in size Medicines:                Monitored Anesthesia Care Procedure:                Pre-Anesthesia Assessment:                           - Prior to the procedure, a History and Physical                            was performed, and patient medications and                            allergies were reviewed. The patient's tolerance of                            previous anesthesia was also reviewed. The risks                            and benefits of the procedure and the sedation                            options and risks were discussed with the patient.                            All questions were answered, and informed consent                            was obtained. Prior Anticoagulants: The patient                            last took Eliquis (apixaban) 2 days prior to the                            procedure. ASA Grade Assessment: II - A patient                            with mild systemic disease. After reviewing the  risks and benefits, the patient was deemed in                            satisfactory condition to undergo the procedure.                           After obtaining informed consent, the colonoscope                            was passed under direct vision. Throughout the                            procedure, the patient's blood pressure, pulse, and                            oxygen saturations were  monitored continuously. The                            Olympus Scope [id number] was introduced through the                            anus and advanced to the the cecum, identified by                            appendiceal orifice and ileocecal valve. The                            colonoscopy was performed without difficulty. The                            patient tolerated the procedure well. The quality                            of the bowel preparation was good. The ileocecal                            valve, appendiceal orifice, and rectum were                            photographed. Scope In: 3:13:06 PM Scope Out: 3:29:12 PM Scope Withdrawal Time: 0 hours 9 minutes 48 seconds  Total Procedure Duration: 0 hours 16 minutes 6 seconds  Findings:                 The perianal and digital rectal examinations were                            normal.                           Scattered large-mouthed, medium-mouthed and                            small-mouthed diverticula were found in the sigmoid  colon, descending colon and transverse colon.                           Non-bleeding external and internal hemorrhoids were                            found during retroflexion. The hemorrhoids were                            medium-sized. Complications:            No immediate complications. Estimated Blood Loss:     Estimated blood loss was minimal. Impression:               - Moderate diverticulosis in the sigmoid colon, in                            the descending colon and in the transverse colon.                           - Non-bleeding external and internal hemorrhoids.                           - No specimens collected. Recommendation:           - Patient has a contact number available for                            emergencies. The signs and symptoms of potential                            delayed complications were discussed with the                             patient. Return to normal activities tomorrow.                            Written discharge instructions were provided to the                            patient.                           - Resume previous diet.                           - Continue present medications.                           - No repeat colonoscopy due to age and the absence                            of colonic polyps.                           - Return to GI clinic PRN.                           -  Resume Eliquis (apixaban) at prior dose today. Napoleon Form, MD 10/25/2023 3:39:47 PM This report has been signed electronically.

## 2023-10-25 NOTE — Progress Notes (Signed)
Pt's states no medical or surgical changes since previsit or office visit. 

## 2023-10-25 NOTE — Progress Notes (Signed)
Report given to PACU, vss 

## 2023-10-28 ENCOUNTER — Telehealth: Payer: Self-pay

## 2023-10-28 NOTE — Telephone Encounter (Signed)
  Follow up Call-     10/25/2023    2:28 PM  Call back number  Post procedure Call Back phone  # [phone]  Permission to leave phone message Yes     Patient questions:  Do you have a fever, pain , or abdominal swelling? No. Pain Score  0 *  Have you tolerated food without any problems? Yes.    Have you been able to return to your normal activities? Yes.    Do you have any questions about your discharge instructions: Diet   No. Medications  No. Follow up visit  No.  Do you have questions or concerns about your Care? No.  Actions: * If pain score is 4 or above: No action needed, pain <4.

## 2023-10-31 ENCOUNTER — Encounter: Payer: Self-pay | Admitting: Internal Medicine

## 2023-11-11 ENCOUNTER — Encounter: Payer: Self-pay | Admitting: Internal Medicine

## 2023-11-11 ENCOUNTER — Ambulatory Visit: Payer: 59 | Admitting: Internal Medicine

## 2023-11-11 VITALS — BP 158/88 | HR 73 | Temp 98.4°F | Resp 16 | Ht 62.0 in | Wt 222.6 lb

## 2023-11-11 DIAGNOSIS — I10 Essential (primary) hypertension: Secondary | ICD-10-CM

## 2023-11-11 DIAGNOSIS — I480 Paroxysmal atrial fibrillation: Secondary | ICD-10-CM | POA: Diagnosis not present

## 2023-11-11 DIAGNOSIS — E039 Hypothyroidism, unspecified: Secondary | ICD-10-CM | POA: Diagnosis not present

## 2023-11-11 LAB — TSH: TSH: 1.44 u[IU]/mL (ref 0.35–5.50)

## 2023-11-11 MED ORDER — INDAPAMIDE 1.25 MG PO TABS: 1.2500 mg | ORAL_TABLET | Freq: Every day | ORAL | 0 refills | Status: DC

## 2023-11-11 MED ORDER — LEVOTHYROXINE SODIUM 50 MCG PO TABS: 50.0000 ug | ORAL_TABLET | Freq: Every day | ORAL | 0 refills | Status: DC

## 2023-11-11 MED ORDER — NEBIVOLOL HCL 10 MG PO TABS: 10.0000 mg | ORAL_TABLET | Freq: Every day | ORAL | 0 refills | Status: DC

## 2023-11-11 NOTE — Patient Instructions (Signed)
Hypertension, Adult High blood pressure (hypertension) is when the force of blood pumping through the arteries is too strong. The arteries are the blood vessels that carry blood from the heart throughout the body. Hypertension forces the heart to work harder to pump blood and may cause arteries to become narrow or stiff. Untreated or uncontrolled hypertension can lead to a heart attack, heart failure, a stroke, kidney disease, and other problems. A blood pressure reading consists of a higher number over a lower number. Ideally, your blood pressure should be below 120/80. The first ("top") number is called the systolic pressure. It is a measure of the pressure in your arteries as your heart beats. The second ("bottom") number is called the diastolic pressure. It is a measure of the pressure in your arteries as the heart relaxes. What are the causes? The exact cause of this condition is not known. There are some conditions that result in high blood pressure. What increases the risk? Certain factors may make you more likely to develop high blood pressure. Some of these risk factors are under your control, including: Smoking. Not getting enough exercise or physical activity. Being overweight. Having too much fat, sugar, calories, or salt (sodium) in your diet. Drinking too much alcohol. Other risk factors include: Having a personal history of heart disease, diabetes, high cholesterol, or kidney disease. Stress. Having a family history of high blood pressure and high cholesterol. Having obstructive sleep apnea. Age. The risk increases with age. What are the signs or symptoms? High blood pressure may not cause symptoms. Very high blood pressure (hypertensive crisis) may cause: Headache. Fast or irregular heartbeats (palpitations). Shortness of breath. Nosebleed. Nausea and vomiting. Vision changes. Severe chest pain, dizziness, and seizures. How is this diagnosed? This condition is diagnosed by  measuring your blood pressure while you are seated, with your arm resting on a flat surface, your legs uncrossed, and your feet flat on the floor. The cuff of the blood pressure monitor will be placed directly against the skin of your upper arm at the level of your heart. Blood pressure should be measured at least twice using the same arm. Certain conditions can cause a difference in blood pressure between your right and left arms. If you have a high blood pressure reading during one visit or you have normal blood pressure with other risk factors, you may be asked to: Return on a different day to have your blood pressure checked again. Monitor your blood pressure at home for 1 week or longer. If you are diagnosed with hypertension, you may have other blood or imaging tests to help your health care provider understand your overall risk for other conditions. How is this treated? This condition is treated by making healthy lifestyle changes, such as eating healthy foods, exercising more, and reducing your alcohol intake. You may be referred for counseling on a healthy diet and physical activity. Your health care provider may prescribe medicine if lifestyle changes are not enough to get your blood pressure under control and if: Your systolic blood pressure is above 130. Your diastolic blood pressure is above 80. Your personal target blood pressure may vary depending on your medical conditions, your age, and other factors. Follow these instructions at home: Eating and drinking  Eat a diet that is high in fiber and potassium, and low in sodium, added sugar, and fat. An example of this eating plan is called the DASH diet. DASH stands for Dietary Approaches to Stop Hypertension. To eat this way: Eat   plenty of fresh fruits and vegetables. Try to fill one half of your plate at each meal with fruits and vegetables. Eat whole grains, such as whole-wheat pasta, brown rice, or whole-grain bread. Fill about one  fourth of your plate with whole grains. Eat or drink low-fat dairy products, such as skim milk or low-fat yogurt. Avoid fatty cuts of meat, processed or cured meats, and poultry with skin. Fill about one fourth of your plate with lean proteins, such as fish, chicken without skin, beans, eggs, or tofu. Avoid pre-made and processed foods. These tend to be higher in sodium, added sugar, and fat. Reduce your daily sodium intake. Many people with hypertension should eat less than 1,500 mg of sodium a day. Do not drink alcohol if: Your health care provider tells you not to drink. You are pregnant, may be pregnant, or are planning to become pregnant. If you drink alcohol: Limit how much you have to: 0-1 drink a day for women. 0-2 drinks a day for men. Know how much alcohol is in your drink. In the [place] one drink equals one 12 oz bottle of beer (355 mL), one 5 oz glass of wine (148 mL), or one 1 oz glass of hard liquor (44 mL). Lifestyle  Work with your health care provider to maintain a healthy body weight or to lose weight. Ask what an ideal weight is for you. Get at least 30 minutes of exercise that causes your heart to beat faster (aerobic exercise) most days of the week. Activities may include walking, swimming, or biking. Include exercise to strengthen your muscles (resistance exercise), such as Pilates or lifting weights, as part of your weekly exercise routine. Try to do these types of exercises for 30 minutes at least 3 days a week. Do not use any products that contain nicotine or tobacco. These products include cigarettes, chewing tobacco, and vaping devices, such as e-cigarettes. If you need help quitting, ask your health care provider. Monitor your blood pressure at home as told by your health care provider. Keep all follow-up visits. This is important. Medicines Take over-the-counter and prescription medicines only as told by your health care provider. Follow directions carefully. Blood  pressure medicines must be taken as prescribed. Do not skip doses of blood pressure medicine. Doing this puts you at risk for problems and can make the medicine less effective. Ask your health care provider about side effects or reactions to medicines that you should watch for. Contact a health care provider if you: Think you are having a reaction to a medicine you are taking. Have headaches that keep coming back (recurring). Feel dizzy. Have swelling in your ankles. Have trouble with your vision. Get help right away if you: Develop a severe headache or confusion. Have unusual weakness or numbness. Feel faint. Have severe pain in your chest or abdomen. Vomit repeatedly. Have trouble breathing. These symptoms may be an emergency. Get help right away. Call 911. Do not wait to see if the symptoms will go away. Do not drive yourself to the hospital. Summary Hypertension is when the force of blood pumping through your arteries is too strong. If this condition is not controlled, it may put you at risk for serious complications. Your personal target blood pressure may vary depending on your medical conditions, your age, and other factors. For most people, a normal blood pressure is less than 120/80. Hypertension is treated with lifestyle changes, medicines, or a combination of both. Lifestyle changes include losing weight, eating a healthy,   low-sodium diet, exercising more, and limiting alcohol. This information is not intended to replace advice given to you by your health care provider. Make sure you discuss any questions you have with your health care provider. Document Revised: 10/24/2021 Document Reviewed: 10/24/2021 Elsevier Patient Education  2024 [employer]vier Inc.  

## 2023-11-11 NOTE — Progress Notes (Signed)
Subjective:  Patient ID: Tina Stone, female    DOB: Sep 15, 1950  Age: 73 y.o. MRN: [id number]  CC: Hypertension and Hypothyroidism   HPI Tina Stone presents for f/up  ----  Discussed the use of AI scribe software for clinical note transcription with the patient, who gave verbal consent to proceed.  History of Present Illness   The patient, with a history of hypertension and thyroid disease, expressed a desire to reduce her medication load, specifically her antihypertensives and thyroid medication. She reported experiencing occasional jitteriness and dizziness, which she suspects may be related to her thyroid medication. However, she denied any chest pain, shortness of breath, changes in weight or appetite, or alterations in bowel habits.  The patient also reported inadequate sleep, which she attributed to a potentially high dose of thyroid medication. She expressed concern that the medication might be causing her sleep disturbances and jitteriness.  The patient acknowledged the need for weight loss and admitted to exercising but consuming more food than she burns off. She speculated that her thyroid medication might be contributing to her increased appetite.  There were no reported issues with her blood thinner, and she denied any bleeding, bruising, or irregular heartbeats since starting the medication. The patient had not experienced any episodes of irregular heartbeats since her initial diagnosis of A fib.       Outpatient Medications Prior to Visit  Medication Sig Dispense Refill   acetaminophen (TYLENOL) 500 MG tablet Take 1,000 mg by mouth every 6 (six) hours as needed for moderate pain.     APPLE CIDER VINEGAR PO Take 5 mLs by mouth as needed.     Cholecalciferol (VITAMIN D3) 50 MCG (2000 UT) TABS Take by mouth.     ELIQUIS 5 MG TABS tablet TAKE 1 TABLET BY MOUTH TWICE A DAY 60 tablet 0   Evolocumab (REPATHA SURECLICK) 140 MG/ML SOAJ Inject 140 mg into the skin every  14 (fourteen) days. 2 mL 11   Multiple Vitamin (MULTIVITAMIN) capsule Take 1 capsule by mouth daily.     NON FORMULARY Herbal Ginger and lemon tea     zinc gluconate 50 MG tablet Take 50 mg by mouth daily.     atenolol (TENORMIN) 25 MG tablet TAKE 1 TABLET (25 MG TOTAL) BY MOUTH DAILY. 30 tablet 0   levothyroxine (SYNTHROID) 75 MCG tablet TAKE 1 TABLET BY MOUTH EVERY DAY BEFORE BREAKFAST 30 tablet 0   No facility-administered medications prior to visit.    ROS Review of Systems  Constitutional:  Negative for appetite change, chills, diaphoresis and fatigue.  HENT: Negative.    Eyes: Negative.   Respiratory: Negative.  Negative for cough, chest tightness, shortness of breath and wheezing.   Cardiovascular:  Negative for chest pain, palpitations and leg swelling.  Gastrointestinal:  Negative for abdominal pain, diarrhea, nausea and vomiting.  Endocrine: Negative for cold intolerance and heat intolerance.  Genitourinary: Negative.  Negative for difficulty urinating.  Musculoskeletal: Negative.   Skin: Negative.   Neurological: Negative.  Negative for dizziness and weakness.  Hematological:  Negative for adenopathy. Does not bruise/bleed easily.  Psychiatric/Behavioral:  Positive for sleep disturbance. Negative for confusion and dysphoric mood. The patient is nervous/anxious.     Objective:  BP (!) 158/88 (BP Location: Right Arm, Patient Position: Sitting, Cuff Size: Large)   Pulse 73   Temp 98.4 F (36.9 C) (Oral)   Resp 16   Ht 5\' 2"  (1.575 m)   Wt 222 lb 9.6 oz (101  kg)   SpO2 95%   BMI 40.71 kg/m   BP Readings from Last 3 Encounters:  11/11/23 (!) 158/88  10/25/23 (!) 112/56  09/27/23 (!) 158/91    Wt Readings from Last 3 Encounters:  11/11/23 222 lb 9.6 oz (101 kg)  10/25/23 221 lb (100.2 kg)  09/27/23 220 lb 12.8 oz (100.2 kg)    Physical Exam Vitals reviewed.  Constitutional:      Appearance: Normal appearance.  HENT:     Mouth/Throat:     Mouth: Mucous  membranes are moist.  Eyes:     General: No scleral icterus.    Conjunctiva/sclera: Conjunctivae normal.  Cardiovascular:     Rate and Rhythm: Normal rate and regular rhythm.     Heart sounds: No murmur heard.    No friction rub. No gallop.  Pulmonary:     Effort: Pulmonary effort is normal.     Breath sounds: No stridor. No wheezing, rhonchi or rales.  Abdominal:     General: Abdomen is flat.     Palpations: There is no mass.     Tenderness: There is no abdominal tenderness. There is no guarding.     Hernia: No hernia is present.  Musculoskeletal:        General: Normal range of motion.     Cervical back: Neck supple.     Right lower leg: No edema.     Left lower leg: No edema.  Lymphadenopathy:     Cervical: No cervical adenopathy.  Skin:    General: Skin is warm and dry.  Neurological:     General: No focal deficit present.     Mental Status: She is alert. Mental status is at baseline.  Psychiatric:        Mood and Affect: Mood normal.        Behavior: Behavior normal.     Lab Results  Component Value Date   WBC 3.9 (L) 09/20/2023   HGB 12.1 09/20/2023   HCT 36.7 09/20/2023   PLT 184 09/20/2023   GLUCOSE 92 09/20/2023   CHOL 201 (H) 08/16/2023   TRIG 53 08/16/2023   HDL 79 08/16/2023   LDLCALC 112 (H) 08/16/2023   ALT 11 09/20/2023   AST 17 09/20/2023   NA 139 09/20/2023   K 3.9 09/20/2023   CL 105 09/20/2023   CREATININE 0.93 09/20/2023   BUN 11 09/20/2023   CO2 29 09/20/2023   TSH 1.44 11/11/2023   INR 1.0 01/28/2021   HGBA1C 5.3 02/02/2022    CT ABDOMEN PELVIS WO CONTRAST  Result Date: 09/26/2023 CLINICAL DATA:  Endometrial/cervical cancer restaging. History of left breast lumpectomy. Prior chemotherapy. * Tracking Code: BO * EXAM: CT ABDOMEN AND PELVIS WITHOUT CONTRAST TECHNIQUE: Multidetector CT imaging of the abdomen and pelvis was performed following the standard protocol without IV contrast. RADIATION DOSE REDUCTION: This exam was performed  according to the departmental dose-optimization program which includes automated exposure control, adjustment of the mA and/or kV according to patient size and/or use of iterative reconstruction technique. COMPARISON:  04/08/2023 FINDINGS: Lower chest: Postoperative findings, left breast. Mild left anterior descending coronary artery atheromatous vascular disease. Hepatobiliary: No change in bilateral hypodense hepatic lesions compatible with cysts. Cholecystectomy. Stable mild prominence of the extrahepatic biliary tree, likely a physiologic response to cholecystectomy. Pancreas: Unremarkable Spleen: Unremarkable Adrenals/Urinary Tract: Unremarkable Stomach/Bowel: Sigmoid colon diverticulosis. Vascular/Lymphatic: Atherosclerosis is present, including aortoiliac atherosclerotic disease. No pathologic adenopathy observed. Reproductive: Uterus absent. Adnexa unremarkable. No mass along the vaginal cuff.  Other: No supplemental non-categorized findings. Musculoskeletal: Chronic symmetric bilateral sacroiliitis. Chronically stable sclerosis in the right femoral neck and right greater trochanter. Lower thoracic and lumbar spondylosis and degenerative disc disease with grade 1 anterolisthesis of L4 on L5. Suspected multilevel foraminal impingement in the lumbar spine. Grade 1 degenerative retrolisthesis at T12-L1 and L1-2. IMPRESSION: 1. No findings of active malignancy. 2. Aortic atherosclerosis. 3. Chronic symmetric bilateral sacroiliitis. 4. Chronic sclerosis in the right femoral neck and right greater trochanter. This is been present at least for 3 years and may be from prior trauma or prior radiation necrosis. This would be an unusual metastatic lesion for cervical/endometrial cancer particularly given the nonprogressive nature of the last couple of years. 5. Sigmoid colon diverticulosis. 6. Lower thoracic and lumbar spondylosis and degenerative disc disease with grade 1 anterolisthesis of L4 on L5. Suspected  multilevel foraminal impingement in the lumbar spine. Aortic Atherosclerosis (ICD10-I70.0). Electronically Signed   By: Gaylyn Rong M.D.   On: 09/26/2023 15:06    Assessment & Plan:   Paroxysmal atrial fibrillation (HCC)- She has good R/R control.  Essential hypertension- She has not achieved her BP goal. Will upgrade to a better BB and will start a thiazide diuretic. -     Nebivolol HCl; Take 1 tablet (10 mg total) by mouth daily.  Dispense: 90 tablet; Refill: 0 -     AMB Referral [facility] Care Management -     Indapamide; Take 1 tablet (1.25 mg total) by mouth daily.  Dispense: 90 tablet; Refill: 0  Acquired hypothyroidism- She does not feel well on the current T4 dose. Will lower the dose. -     TSH; Future -     Levothyroxine Sodium; Take 1 tablet (50 mcg total) by mouth daily.  Dispense: 90 tablet; Refill: 0     Follow-up: Return in about 3 months (around 02/11/2024).  Sanda Linger, MD

## 2023-11-12 ENCOUNTER — Encounter: Payer: Self-pay | Admitting: Gastroenterology

## 2023-11-14 ENCOUNTER — Telehealth: Payer: Self-pay

## 2023-11-14 NOTE — Progress Notes (Signed)
   Care Guide Note  11/14/2023 Name: [name] MRN: [id number] DOB: [birth year]  Referred by: Etta Grandchild, MD Reason for referral : No chief complaint on file.   Tina Stone is a 73 y.o. year old female who is a primary care patient of Etta Grandchild, MD. Dominic Pea was referred to the pharmacist for assistance related to HTN.    Successful contact was made with the patient to discuss pharmacy services including being ready for the pharmacist to call at least 5 minutes before the scheduled appointment time, to have medication bottles and any blood sugar or blood pressure readings ready for review. The patient agreed to meet with the pharmacist via with the pharmacist via telephone visit on (date/time).   12/06/2023 Penne Lash, RMA Care Guide [facility]  [place], [place] [address] Direct Dial: [phone] [email or web address]

## 2023-11-20 ENCOUNTER — Telehealth: Payer: Self-pay | Admitting: Internal Medicine

## 2023-11-20 NOTE — Telephone Encounter (Signed)
Patient experienced dizziness when taking the indapamide and nebivilol - she is going to try taking the indapamide later and see if that helps.  What does Dr. Yetta Barre suggest doing?  Please call patient and advise.  [phone].

## 2023-11-20 NOTE — Telephone Encounter (Signed)
Please advise 

## 2023-11-21 NOTE — Telephone Encounter (Signed)
Patient called back and said that she took the medications separate and she feels better.  If this was not the right thing to do, please call her.

## 2023-11-22 ENCOUNTER — Ambulatory Visit (INDEPENDENT_AMBULATORY_CARE_PROVIDER_SITE_OTHER): Payer: 59 | Admitting: Family

## 2023-11-22 VITALS — BP 146/85 | HR 67 | Temp 98.0°F | Ht 62.0 in | Wt 220.0 lb

## 2023-11-22 DIAGNOSIS — I480 Paroxysmal atrial fibrillation: Secondary | ICD-10-CM

## 2023-11-22 DIAGNOSIS — I10 Essential (primary) hypertension: Secondary | ICD-10-CM

## 2023-11-22 MED ORDER — ATENOLOL 25 MG PO TABS: 25.0000 mg | ORAL_TABLET | Freq: Every day | ORAL | 2 refills | Status: DC

## 2023-11-22 MED ORDER — ATENOLOL 25 MG PO TABS
25.0000 mg | ORAL_TABLET | Freq: Every evening | ORAL | 2 refills | Status: DC
Start: 2023-11-22 — End: 2024-06-05

## 2023-11-22 NOTE — Progress Notes (Signed)
Patient ID: Tina Stone, female    DOB: 03/01/1950, 73 y.o.   MRN: [id number]  Chief Complaint  Patient presents with   Dizziness    Pt c/o dizziness and lightheadedness for about a week after changing Lozol.    Discussed the use of AI scribe software for clinical note transcription with the patient, who gave verbal consent to proceed.  History of Present Illness   Tina Stone, a patient with a past medical history of cancer and a heart rhythm problem, presents with dizziness and elevated blood pressure. She reports that her blood pressure has been consistently around 120/60-80, but during her last visit to her primary care doctor, it was 150/something. She also mentions experiencing constipation and abdominal pain, which she believes might have contributed to the increase in her blood pressure.  Tina Stone was previously on Atenolol 25mg  daily, which she took in the evenings for about two years. She reports that she did not have any major problems with Atenolol. However, she recently stopped taking Atenolol and was started on Nebivolol 10mg  and Indapamide 1.5mg  by her primary care doctor. due to the higher BP. She initially took both medications together in the morning but started experiencing extreme lightheadedness and dizziness. She then decided to separate the two medications by taking them two to three hours apart, which she reports made her feel okay, but she still experienced dizziness.  Tina Stone expresses a desire to switch back to Atenolol, as she believes the Nebivolol is not working for her. She also mentions that she has been experiencing constipation since starting the new medications. She does not currently check her BP at home.    Assessment & Plan:     Hypertension - Elevated blood pressure readings and dizziness. Previously on Atenolol 25mg  daily, switched to Nebivolol 10mg  daily and Indapamide 1.5mg  daily. Patient reports dizziness with current regimen. Pt denies any CP or  palpitations, no detected irregular heart rhythm. -Discontinue Nebivolol. -Resume Atenolol 25mg  daily in the evening. -Continue Indapamide 1.5mg  daily in the morning. -Advised on the importance of hydrating with at least 2L of water daily. -Follow up with primary care provider as planned or sooner if symptoms do not resolve.    Subjective:    Outpatient Medications Prior to Visit  Medication Sig Dispense Refill   acetaminophen (TYLENOL) 500 MG tablet Take 1,000 mg by mouth every 6 (six) hours as needed for moderate pain.     APPLE CIDER VINEGAR PO Take 5 mLs by mouth as needed.     Cholecalciferol (VITAMIN D3) 50 MCG (2000 UT) TABS Take by mouth.     ELIQUIS 5 MG TABS tablet TAKE 1 TABLET BY MOUTH TWICE A DAY 60 tablet 0   Evolocumab (REPATHA SURECLICK) 140 MG/ML SOAJ Inject 140 mg into the skin every 14 (fourteen) days. 2 mL 11   indapamide (LOZOL) 1.25 MG tablet Take 1 tablet (1.25 mg total) by mouth daily. 90 tablet 0   levothyroxine (SYNTHROID) 50 MCG tablet Take 1 tablet (50 mcg total) by mouth daily. 90 tablet 0   Multiple Vitamin (MULTIVITAMIN) capsule Take 1 capsule by mouth daily.     nebivolol (BYSTOLIC) 10 MG tablet Take 1 tablet (10 mg total) by mouth daily. 90 tablet 0   NON FORMULARY Herbal Ginger and lemon tea     zinc gluconate 50 MG tablet Take 50 mg by mouth daily.     No facility-administered medications prior to visit.   Past Medical History:  Diagnosis Date  Adenomatous colon polyp    Allergic rhinitis, seasonal    Allergy    Atrial fibrillation (HCC)    Beta thalassemia trait 11/25/2013   Cholelithiasis    Class 3 obesity without serious comorbidity with body mass index (BMI) of 40.0 to 44.9 in adult 11/19/2012   DDD (degenerative disc disease), lumbar    endometrial ca dx'd 08/2009   endometrial    GERD (gastroesophageal reflux disease)    H. pylori infection    History of radiation therapy    endometrial - 02/19/2022-03/23/2022 Dr Antony Blackbird   HLD  (hyperlipidemia)    Hypercholesterolemia    Hypertension 03/18/2017   no meds    Hypothyroidism    Intraductal papilloma of left breast    Patient underwent left needle-localized lumpectomy by Dr. Wilmon Arms. Tsuei on 09/09/2013; pathology showed intraductal papilloma with no atypia or malignancy identified.   Morbid (severe) obesity due to excess calories (HCC) 09/27/2023   bmi 40.38   PONV (postoperative nausea and vomiting)    Pre-diabetes    pt denies   Sciatica    Spinal stenosis    Urticaria    Uterine fibroid    Past Surgical History:  Procedure Laterality Date   ANKLE FRACTURE SURGERY Right 1966   right   BREAST EXCISIONAL BIOPSY Left 2014   benign   BREAST LUMPECTOMY WITH NEEDLE LOCALIZATION Left 09/09/2013   Procedure: BREAST LUMPECTOMY WITH NEEDLE LOCALIZATION;  Surgeon: Wilmon Arms. Corliss Skains, MD;  Location: [facility];  Service: General;  Laterality: Left;   CHOLECYSTECTOMY     COLONOSCOPY     IR IMAGING GUIDED PORT INSERTION  05/18/2020   IR REMOVAL TUN ACCESS W/ PORT W/O FL MOD SED  01/02/2023   POLYPECTOMY     ROBOTIC ASSISTED LAPAROSCOPIC CHOLECYSTECTOMY  09/09/2019   ROBOTIC ASSISTED TOTAL HYSTERECTOMY WITH BILATERAL SALPINGO OOPHERECTOMY N/A 10/22/2019   Procedure: XI ROBOTIC ASSISTED TOTAL HYSTERECTOMY WITH BILATERAL SALPINGO OOPHORECTOMY GREATER THAN 250 GRAMS, MINI LAPARTOMY FOR SPECIMEN DELIVERY; PELVIC AND PERI-AORTIC LYMPHADENECTOMY;  Surgeon: Adolphus Birchwood, MD;  Location: [facility];  Service: Gynecology;  Laterality: N/A;   SENTINEL NODE BIOPSY N/A 10/22/2019   Procedure: SENTINEL NODE BIOPSY;  Surgeon: Adolphus Birchwood, MD;  Location: [facility];  Service: Gynecology;  Laterality: N/A;   Allergies  Allergen Reactions   Benicar Hct [Olmesartan Medoxomil-Hctz] Shortness Of Breath and Palpitations   Iodinated Contrast Media Hives and Itching    02/02/2021-  pt developed 2 hives and itching even with the 13hr premedication.  Radiology PA came into eval the pt.  Developed itching and  hives after injection on 05/10/20; needs 13hr prep in future   Crestor [Rosuvastatin] Other (See Comments)    Muscle aches   Bactrim [Sulfamethoxazole-Trimethoprim] Other (See Comments)    Abdominal pain, dizziness   Pravachol [Pravastatin] Other (See Comments)    Lower abdominal pain   Shellfish Allergy Nausea And Vomiting   Amlodipine Palpitations      Objective:    Physical Exam Vitals and nursing note reviewed.  Constitutional:      Appearance: Normal appearance. She is obese.  Cardiovascular:     Rate and Rhythm: Normal rate and regular rhythm.  Pulmonary:     Effort: Pulmonary effort is normal.     Breath sounds: Normal breath sounds.  Musculoskeletal:        General: Normal range of motion.  Skin:    General: Skin is warm and dry.  Neurological:     Mental Status: She is alert.  Psychiatric:        Mood and Affect: Mood normal.        Behavior: Behavior normal.    BP (!) 146/85 (BP Location: Left Arm, Patient Position: Sitting, Cuff Size: Large)   Pulse 67   Temp 98 F (36.7 C) (Temporal)   Ht 5\' 2"  (1.575 m)   Wt 220 lb (99.8 kg)   SpO2 98%   BMI 40.24 kg/m  Wt Readings from Last 3 Encounters:  11/22/23 220 lb (99.8 kg)  11/11/23 222 lb 9.6 oz (101 kg)  10/25/23 221 lb (100.2 kg)      Dulce Sellar, NP

## 2023-11-22 NOTE — Patient Instructions (Addendum)
It was very nice to see you today!   Stop the Nebivolol (Bystolic) medication for your blood pressure. I have sent in the Atenolol for you to start this evening and continue taking in the evenings. Continue taking the Indapamide (Lozol) in the mornings, this helps to get the extra fluid off of you and lower your blood pressure. Be sure you are drinking up to 2 liters = 64 ounces of water every day to avoid getting dehydrated.  Follow up with Dr Yetta Barre as planned or sooner if needed.    PLEASE NOTE:  If you had any lab tests please let us know if you have not heard back within a few days. You may see your results on MyChart before we have a chance to review them but we will give you a call once they are reviewed by [place]. If we ordered any referrals today, please let us know if you have not heard from their office within the next week.

## 2023-12-02 ENCOUNTER — Other Ambulatory Visit: Payer: 59

## 2023-12-02 NOTE — Patient Instructions (Signed)
It was a pleasure speaking with you today!  Continue your current medication regimen and check your blood pressure daily at home. Keep a log of your readings and I will call back in 3 weeks to review them with you.  Feel free to call with any questions or concerns!  Arbutus Leas, PharmD, BCPS [facility] Clinical Pharmacist [facility] [phone]

## 2023-12-02 NOTE — Progress Notes (Signed)
   12/02/2023 Name: Tina Stone MRN: [id number] DOB: [birth year]  No chief complaint on file.   Tina Stone is a 73 y.o. year old female who presented for a telephone visit.   They were referred to the pharmacist by their PCP for assistance in managing hypertension.    Subjective:  Care Team: Primary Care Provider: Etta Grandchild, MD ; Next Scheduled Visit: none scheduled  Medication Access/Adherence  Current Pharmacy:  [id number] - [place], [place] - [address] [place] [address] Phone: [phone] Fax: [phone]  [id number] - [place], [place] - [address] [place] [address] Phone: [phone] Fax: [phone]   Patient reports affordability concerns with their medications: No  Patient reports access/transportation concerns to their pharmacy: No  Patient reports adherence concerns with their medications:  Yes     Hypertension:  Current medications: atenolol 25 mg daily Only taking indapamide 1.25 mg about every other day  *She had dizziness when she was changed to nebivolol plus indapamide. She was switched back to atenolol and was told to continue indapamide  Patient has a validated, automated, upper arm home BP cuff Current blood pressure readings readings: none recent  Patient denies hypotensive s/sx including dizziness, lightheadedness.  Patient denies hypertensive symptoms including headache, chest pain, shortness of breath   Objective: BP Readings from Last 3 Encounters:  11/22/23 (!) 146/85  11/11/23 (!) 158/88  10/25/23 (!) 112/56     Lab Results  Component Value Date   HGBA1C 5.3 02/02/2022    Lab Results  Component Value Date   CREATININE 0.93 09/20/2023   BUN 11 09/20/2023   NA 139 09/20/2023   K 3.9 09/20/2023   CL 105 09/20/2023   CO2 29 09/20/2023    Lab Results  Component Value Date   CHOL 201 (H) 08/16/2023   HDL 79 08/16/2023    LDLCALC 112 (H) 08/16/2023   TRIG 53 08/16/2023   CHOLHDL 2.5 08/16/2023    Medications Reviewed Today   Medications were not reviewed in this encounter       Assessment/Plan:   Hypertension: - Currently uncontrolled, BP goal <130/80 without hypotension sx - Reviewed long term cardiovascular and renal outcomes of uncontrolled blood pressure - Reviewed appropriate blood pressure monitoring technique and reviewed goal blood pressure. Recommended to check home blood pressure and heart rate daily and keep a log - Recommend to continue current regimen - F/u in 3 weeks to review home BP readings     Follow Up Plan: 12/23 at 9 AM  Arbutus Leas, PharmD, BCPS, CPP Clinical Pharmacist Practitioner [facility] [phone]

## 2023-12-04 ENCOUNTER — Encounter: Payer: Self-pay | Admitting: Emergency Medicine

## 2023-12-04 ENCOUNTER — Ambulatory Visit: Payer: 59 | Admitting: Emergency Medicine

## 2023-12-04 VITALS — BP 140/82 | HR 63 | Temp 97.9°F | Ht 62.0 in | Wt 218.0 lb

## 2023-12-04 DIAGNOSIS — I10 Essential (primary) hypertension: Secondary | ICD-10-CM

## 2023-12-04 NOTE — Progress Notes (Signed)
Tina Stone 73 y.o.   Chief Complaint  Patient presents with   Hypertension    Patient states her b/p is still elevate  154/94 was the most recent. Patient states she is now taking atenolol previous meds caused her to become light headed and dizzy. Pt brought her home b/p device to compare on her device today was 158/ 97    HISTORY OF PRESENT ILLNESS: This is a 73 y.o. female complaining of persistently elevated blood pressure readings over the past couple of weeks. Asymptomatic.  Hypertension Pertinent negatives include no chest pain, headaches, palpitations or shortness of breath.     Prior to Admission medications   Medication Sig Start Date End Date Taking? Authorizing Provider  acetaminophen (TYLENOL) 500 MG tablet Take 1,000 mg by mouth every 6 (six) hours as needed for moderate pain.   Yes [provider]  APPLE CIDER VINEGAR PO Take 5 mLs by mouth as needed.   Yes [provider]  atenolol (TENORMIN) 25 MG tablet Take 1 tablet (25 mg total) by mouth every evening. 11/22/23  Yes Dulce Sellar, NP  Cholecalciferol (VITAMIN D3) 50 MCG (2000 UT) TABS Take by mouth.   Yes [provider]  ELIQUIS 5 MG TABS tablet TAKE 1 TABLET BY MOUTH TWICE A DAY 10/22/23  Yes Etta Grandchild, MD  Evolocumab (REPATHA SURECLICK) 140 MG/ML SOAJ Inject 140 mg into the skin every 14 (fourteen) days. 05/09/23  Yes Hilty, Lisette Abu, MD  indapamide (LOZOL) 1.25 MG tablet Take 1 tablet (1.25 mg total) by mouth daily. 11/11/23  Yes Etta Grandchild, MD  levothyroxine (SYNTHROID) 50 MCG tablet Take 1 tablet (50 mcg total) by mouth daily. 11/11/23  Yes Etta Grandchild, MD  Multiple Vitamin (MULTIVITAMIN) capsule Take 1 capsule by mouth daily.   Yes [provider]  NON FORMULARY Herbal Ginger and lemon tea   Yes [provider]  zinc gluconate 50 MG tablet Take 50 mg by mouth daily.   Yes [provider]    Allergies  Allergen Reactions    Benicar Hct [Olmesartan Medoxomil-Hctz] Shortness Of Breath and Palpitations   Iodinated Contrast Media Hives and Itching    02/02/2021-  pt developed 2 hives and itching even with the 13hr premedication.  Radiology PA came into eval the pt.  Developed itching and hives after injection on 05/10/20; needs 13hr prep in future   Crestor [Rosuvastatin] Other (See Comments)    Muscle aches   Bactrim [Sulfamethoxazole-Trimethoprim] Other (See Comments)    Abdominal pain, dizziness   Pravachol [Pravastatin] Other (See Comments)    Lower abdominal pain   Shellfish Allergy Nausea And Vomiting   Amlodipine Palpitations    Patient Active Problem List   Diagnosis Date Noted   History of colonic polyps 09/13/2023   Left lumbar radiculopathy 08/19/2023   Hyponatremia 08/19/2023   Low mean corpuscular volume (MCV) 08/19/2023   Acute bilateral back pain 08/09/2023   Hematuria 08/09/2023   Gastritis 06/02/2023   Constipation 06/02/2023   Abdominal pain 04/01/2023   Congestion of nasal sinus 02/01/2023   Polyp of colon 01/09/2023   Benign liver cyst 12/21/2022   Right hip pain 08/28/2022   Aortic atherosclerosis (HCC) 08/01/2022   Chronic idiopathic urticaria 11/22/2021   Chronic anticoagulation 10/02/2021   Visit for screening mammogram 08/18/2021   Acquired hypothyroidism 08/18/2021   Allergic rhinoconjunctivitis 08/18/2021   Encounter for general adult medical examination with abnormal findings 08/18/2021   Hyperlipidemia with target LDL less than  100 08/17/2021   Laryngitis, chronic 08/17/2021   Paroxysmal atrial fibrillation (HCC) 02/03/2021   Secondary hypercoagulable state (HCC) 02/03/2021   Essential hypertension 11/16/2020   Vitamin D deficiency 10/27/2020   Endometrial cancer (HCC) 10/22/2019   Upper respiratory infection, viral 04/01/2018   Acute cough 02/02/2014   Beta thalassemia trait 11/25/2013   Class II obesity 11/19/2012   Gastroesophageal reflux disease 01/28/2007     Past Medical History:  Diagnosis Date   Adenomatous colon polyp    Allergic rhinitis, seasonal    Allergy    Atrial fibrillation (HCC)    Beta thalassemia trait 11/25/2013   Cholelithiasis    Class 3 obesity without serious comorbidity with body mass index (BMI) of 40.0 to 44.9 in adult 11/19/2012   DDD (degenerative disc disease), lumbar    endometrial ca dx'd 08/2009   endometrial    GERD (gastroesophageal reflux disease)    H. pylori infection    History of radiation therapy    endometrial - 02/19/2022-03/23/2022 Dr Antony Blackbird   HLD (hyperlipidemia)    Hypercholesterolemia    Hypertension 03/18/2017   no meds    Hypothyroidism    Intraductal papilloma of left breast    Patient underwent left needle-localized lumpectomy by Dr. Wilmon Arms. Tsuei on 09/09/2013; pathology showed intraductal papilloma with no atypia or malignancy identified.   Morbid (severe) obesity due to excess calories (HCC) 09/27/2023   bmi 40.38   PONV (postoperative nausea and vomiting)    Pre-diabetes    pt denies   Sciatica    Spinal stenosis    Urticaria    Uterine fibroid     Past Surgical History:  Procedure Laterality Date   ANKLE FRACTURE SURGERY Right 1966   right   BREAST EXCISIONAL BIOPSY Left 2014   benign   BREAST LUMPECTOMY WITH NEEDLE LOCALIZATION Left 09/09/2013   Procedure: BREAST LUMPECTOMY WITH NEEDLE LOCALIZATION;  Surgeon: Wilmon Arms. Corliss Skains, MD;  Location: [facility] OR;  Service: General;  Laterality: Left;   CHOLECYSTECTOMY     COLONOSCOPY     IR IMAGING GUIDED PORT INSERTION  05/18/2020   IR REMOVAL TUN ACCESS W/ PORT W/O FL MOD SED  01/02/2023   POLYPECTOMY     ROBOTIC ASSISTED LAPAROSCOPIC CHOLECYSTECTOMY  09/09/2019   ROBOTIC ASSISTED TOTAL HYSTERECTOMY WITH BILATERAL SALPINGO OOPHERECTOMY N/A 10/22/2019   Procedure: XI ROBOTIC ASSISTED TOTAL HYSTERECTOMY WITH BILATERAL SALPINGO OOPHORECTOMY GREATER THAN 250 GRAMS, MINI LAPARTOMY FOR SPECIMEN DELIVERY; PELVIC AND PERI-AORTIC  LYMPHADENECTOMY;  Surgeon: Adolphus Birchwood, MD;  Location: [facility];  Service: Gynecology;  Laterality: N/A;   SENTINEL NODE BIOPSY N/A 10/22/2019   Procedure: SENTINEL NODE BIOPSY;  Surgeon: Adolphus Birchwood, MD;  Location: [facility];  Service: Gynecology;  Laterality: N/A;    Social History   Socioeconomic History   Marital status: Widowed    Spouse name: Not on file   Number of children: 0   Years of education: Not on file   Highest education level: Bachelor's degree (e.g., BA, AB, BS)  Occupational History   Occupation: Geographical information systems officer  Tobacco Use   Smoking status: Never   Smokeless tobacco: Never   Tobacco comments:    few puffs but not a true smoker quit many yrs ago  Vaping Use   Vaping status: Never Used  Substance and Sexual Activity   Alcohol use: Never   Drug use: Never   Sexual activity: Not Currently  Other Topics Concern   Not on file  Social History Narrative   **  Merged History Encounter **       Lives in [place], widowed 2003   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         Social Determinants of Health   Financial Resource Strain: Low Risk  (11/08/2023)   Overall Financial Resource Strain (CARDIA)    Difficulty of Paying Living Expenses: Not very hard  Food Insecurity: No Food Insecurity (11/08/2023)   Hunger Vital Sign    Worried About Running Out of Food in the Last Year: Never true    Ran Out of Food in the Last Year: Never true  Transportation Needs: No Transportation Needs (11/08/2023)   PRAPARE - Administrator, Civil Service (Medical): No    Lack of Transportation (Non-Medical): No  Physical Activity: Sufficiently Active (11/08/2023)   Exercise Vital Sign    Days of Exercise per Week: 3 days    Minutes of Exercise per Session: 90 min  Stress: No Stress Concern Present (11/08/2023)   [employer] of Occupational Health - Occupational Stress Questionnaire    Feeling of Stress : Not at all  Social Connections: Moderately Integrated  (11/08/2023)   Social Connection and Isolation Panel [NHANES]    Frequency of Communication with Friends and Family: More than three times a week    Frequency of Social Gatherings with Friends and Family: Three times a week    Attends Religious Services: More than 4 times per year    Active Member of Clubs or Organizations: Yes    Attends Banker Meetings: 1 to 4 times per year    Marital Status: Widowed  Intimate Partner Violence: Not At Risk (05/20/2023)   Humiliation, Afraid, Rape, and Kick questionnaire    Fear of Current or Ex-Partner: No    Emotionally Abused: No    Physically Abused: No    Sexually Abused: No    Family History  Problem Relation Age of Onset   Diabetes Mother    Hypertension Mother    Colon polyps Mother 94   Dementia Mother 22   Diabetes Father    Congestive Heart Failure Father    Asthma Brother    Diabetes Brother    Hypertension Brother    Hypertension Brother    Hypertension Maternal Grandmother    Diabetes Maternal Grandmother    Asthma Paternal Grandmother    Lung disease Paternal Grandmother    Diabetes Paternal Grandfather    Pancreatic cancer Paternal Aunt    Uterine cancer Paternal Aunt    Colon cancer Neg Hx    Breast cancer Neg Hx    Lung cancer Neg Hx    Esophageal cancer Neg Hx    Rectal cancer Neg Hx    Stomach cancer Neg Hx      Review of Systems  Constitutional: Negative.  Negative for chills and fever.  HENT: Negative.  Negative for congestion and sore throat.   Respiratory: Negative.  Negative for cough and shortness of breath.   Cardiovascular: Negative.  Negative for chest pain and palpitations.  Gastrointestinal:  Negative for abdominal pain, diarrhea, nausea and vomiting.  Skin: Negative.   Neurological: Negative.  Negative for dizziness and headaches.  All other systems reviewed and are negative.   Vitals:   12/04/23 0824  BP: (!) 140/82  Pulse: 63  Temp: 97.9 F (36.6 C)  SpO2: 93%    Physical  Exam Vitals reviewed.  Constitutional:      Appearance: Normal appearance.  HENT:  Head: Normocephalic.     Mouth/Throat:     Mouth: Mucous membranes are moist.     Pharynx: Oropharynx is clear.  Eyes:     Extraocular Movements: Extraocular movements intact.     Pupils: Pupils are equal, round, and reactive to light.  Cardiovascular:     Rate and Rhythm: Normal rate and regular rhythm.     Pulses: Normal pulses.     Heart sounds: Normal heart sounds.  Pulmonary:     Effort: Pulmonary effort is normal.     Breath sounds: Normal breath sounds.  Musculoskeletal:     Cervical back: No tenderness.  Lymphadenopathy:     Cervical: No cervical adenopathy.  Skin:    General: Skin is warm and dry.     Capillary Refill: Capillary refill takes less than 2 seconds.  Neurological:     General: No focal deficit present.     Mental Status: She is alert and oriented to person, place, and time.  Psychiatric:        Mood and Affect: Mood normal.        Behavior: Behavior normal.      ASSESSMENT & PLAN: A total of 44 minutes was spent with the patient and counseling/coordination of care regarding preparing for this visit, review of most recent office visit notes, review of multiple chronic medical conditions and their management, cardiovascular risks associated with uncontrolled hypertension, review of all medications and changes made, review of most recent bloodwork results, review of health maintenance items, education on nutrition, prognosis, documentation, and need for follow up.   Problem List Items Addressed This Visit       Cardiovascular and Mediastinum   Essential hypertension - Primary    Uncontrolled hypertension Elevated blood pressure readings in the office and at home Asymptomatic Cardiovascular risks associated with hypertension discussed Heart rate in the low 60s.  Recommend to continue atenolol 25 mg daily Recommend to increase thiazide diuretic Lozol to 2.5 mg  daily Advised to continue monitoring blood pressure readings at home daily for the next several weeks and contact the office if numbers persistently abnormal Recommend follow-up with PCP      Patient Instructions  Cannot atenolol 25 mg daily Increase dose of indapamide to 2.5 mg daily.  Take 2 tablets of 1.25 mg daily Continue monitoring blood pressure readings at home daily for the next several weeks.  Contact the office if numbers persistently abnormal Recommend follow-up with PCP   hypertension, Adult High blood pressure (hypertension) is when the force of blood pumping through the arteries is too strong. The arteries are the blood vessels that carry blood from the heart throughout the body. Hypertension forces the heart to work harder to pump blood and may cause arteries to become narrow or stiff. Untreated or uncontrolled hypertension can lead to a heart attack, heart failure, a stroke, kidney disease, and other problems. A blood pressure reading consists of a higher number over a lower number. Ideally, your blood pressure should be below 120/80. The first ("top") number is called the systolic pressure. It is a measure of the pressure in your arteries as your heart beats. The second ("bottom") number is called the diastolic pressure. It is a measure of the pressure in your arteries as the heart relaxes. What are the causes? The exact cause of this condition is not known. There are some conditions that result in high blood pressure. What increases the risk? Certain factors may make you more likely to develop high blood  pressure. Some of these risk factors are under your control, including: Smoking. Not getting enough exercise or physical activity. Being overweight. Having too much fat, sugar, calories, or salt (sodium) in your diet. Drinking too much alcohol. Other risk factors include: Having a personal history of heart disease, diabetes, high cholesterol, or kidney  disease. Stress. Having a family history of high blood pressure and high cholesterol. Having obstructive sleep apnea. Age. The risk increases with age. What are the signs or symptoms? High blood pressure may not cause symptoms. Very high blood pressure (hypertensive crisis) may cause: Headache. Fast or irregular heartbeats (palpitations). Shortness of breath. Nosebleed. Nausea and vomiting. Vision changes. Severe chest pain, dizziness, and seizures. How is this diagnosed? This condition is diagnosed by measuring your blood pressure while you are seated, with your arm resting on a flat surface, your legs uncrossed, and your feet flat on the floor. The cuff of the blood pressure monitor will be placed directly against the skin of your upper arm at the level of your heart. Blood pressure should be measured at least twice using the same arm. Certain conditions can cause a difference in blood pressure between your right and left arms. If you have a high blood pressure reading during one visit or you have normal blood pressure with other risk factors, you may be asked to: Return on a different day to have your blood pressure checked again. Monitor your blood pressure at home for 1 week or longer. If you are diagnosed with hypertension, you may have other blood or imaging tests to help your health care provider understand your overall risk for other conditions. How is this treated? This condition is treated by making healthy lifestyle changes, such as eating healthy foods, exercising more, and reducing your alcohol intake. You may be referred for counseling on a healthy diet and physical activity. Your health care provider may prescribe medicine if lifestyle changes are not enough to get your blood pressure under control and if: Your systolic blood pressure is above 130. Your diastolic blood pressure is above 80. Your personal target blood pressure may vary depending on your medical conditions,  your age, and other factors. Follow these instructions at home: Eating and drinking  Eat a diet that is high in fiber and potassium, and low in sodium, added sugar, and fat. An example of this eating plan is called the DASH diet. DASH stands for Dietary Approaches to Stop Hypertension. To eat this way: Eat plenty of fresh fruits and vegetables. Try to fill one half of your plate at each meal with fruits and vegetables. Eat whole grains, such as whole-wheat pasta, brown rice, or whole-grain bread. Fill about one fourth of your plate with whole grains. Eat or drink low-fat dairy products, such as skim milk or low-fat yogurt. Avoid fatty cuts of meat, processed or cured meats, and poultry with skin. Fill about one fourth of your plate with lean proteins, such as fish, chicken without skin, beans, eggs, or tofu. Avoid pre-made and processed foods. These tend to be higher in sodium, added sugar, and fat. Reduce your daily sodium intake. Many people with hypertension should eat less than 1,500 mg of sodium a day. Do not drink alcohol if: Your health care provider tells you not to drink. You are pregnant, may be pregnant, or are planning to become pregnant. If you drink alcohol: Limit how much you have to: 0-1 drink a day for women. 0-2 drinks a day for men. Know how much alcohol  is in your drink. In the [place] one drink equals one 12 oz bottle of beer (355 mL), one 5 oz glass of wine (148 mL), or one 1 oz glass of hard liquor (44 mL). Lifestyle  Work with your health care provider to maintain a healthy body weight or to lose weight. Ask what an ideal weight is for you. Get at least 30 minutes of exercise that causes your heart to beat faster (aerobic exercise) most days of the week. Activities may include walking, swimming, or biking. Include exercise to strengthen your muscles (resistance exercise), such as Pilates or lifting weights, as part of your weekly exercise routine. Try to do these types  of exercises for 30 minutes at least 3 days a week. Do not use any products that contain nicotine or tobacco. These products include cigarettes, chewing tobacco, and vaping devices, such as e-cigarettes. If you need help quitting, ask your health care provider. Monitor your blood pressure at home as told by your health care provider. Keep all follow-up visits. This is important. Medicines Take over-the-counter and prescription medicines only as told by your health care provider. Follow directions carefully. Blood pressure medicines must be taken as prescribed. Do not skip doses of blood pressure medicine. Doing this puts you at risk for problems and can make the medicine less effective. Ask your health care provider about side effects or reactions to medicines that you should watch for. Contact a health care provider if you: Think you are having a reaction to a medicine you are taking. Have headaches that keep coming back (recurring). Feel dizzy. Have swelling in your ankles. Have trouble with your vision. Get help right away if you: Develop a severe headache or confusion. Have unusual weakness or numbness. Feel faint. Have severe pain in your chest or abdomen. Vomit repeatedly. Have trouble breathing. These symptoms may be an emergency. Get help right away. Call 911. Do not wait to see if the symptoms will go away. Do not drive yourself to the hospital. Summary Hypertension is when the force of blood pumping through your arteries is too strong. If this condition is not controlled, it may put you at risk for serious complications. Your personal target blood pressure may vary depending on your medical conditions, your age, and other factors. For most people, a normal blood pressure is less than 120/80. Hypertension is treated with lifestyle changes, medicines, or a combination of both. Lifestyle changes include losing weight, eating a healthy, low-sodium diet, exercising more, and limiting  alcohol. This information is not intended to replace advice given to you by your health care provider. Make sure you discuss any questions you have with your health care provider. Document Revised: 10/24/2021 Document Reviewed: 10/24/2021 Elsevier Patient Education  2024 [employer]     Edwina Barth, MD [facility]

## 2023-12-04 NOTE — Patient Instructions (Addendum)
Cannot atenolol 25 mg daily Increase dose of indapamide to 2.5 mg daily.  Take 2 tablets of 1.25 mg daily Continue monitoring blood pressure readings at home daily for the next several weeks.  Contact the office if numbers persistently abnormal Recommend follow-up with PCP   hypertension, Adult High blood pressure (hypertension) is when the force of blood pumping through the arteries is too strong. The arteries are the blood vessels that carry blood from the heart throughout the body. Hypertension forces the heart to work harder to pump blood and may cause arteries to become narrow or stiff. Untreated or uncontrolled hypertension can lead to a heart attack, heart failure, a stroke, kidney disease, and other problems. A blood pressure reading consists of a higher number over a lower number. Ideally, your blood pressure should be below 120/80. The first ("top") number is called the systolic pressure. It is a measure of the pressure in your arteries as your heart beats. The second ("bottom") number is called the diastolic pressure. It is a measure of the pressure in your arteries as the heart relaxes. What are the causes? The exact cause of this condition is not known. There are some conditions that result in high blood pressure. What increases the risk? Certain factors may make you more likely to develop high blood pressure. Some of these risk factors are under your control, including: Smoking. Not getting enough exercise or physical activity. Being overweight. Having too much fat, sugar, calories, or salt (sodium) in your diet. Drinking too much alcohol. Other risk factors include: Having a personal history of heart disease, diabetes, high cholesterol, or kidney disease. Stress. Having a family history of high blood pressure and high cholesterol. Having obstructive sleep apnea. Age. The risk increases with age. What are the signs or symptoms? High blood pressure may not cause symptoms. Very  high blood pressure (hypertensive crisis) may cause: Headache. Fast or irregular heartbeats (palpitations). Shortness of breath. Nosebleed. Nausea and vomiting. Vision changes. Severe chest pain, dizziness, and seizures. How is this diagnosed? This condition is diagnosed by measuring your blood pressure while you are seated, with your arm resting on a flat surface, your legs uncrossed, and your feet flat on the floor. The cuff of the blood pressure monitor will be placed directly against the skin of your upper arm at the level of your heart. Blood pressure should be measured at least twice using the same arm. Certain conditions can cause a difference in blood pressure between your right and left arms. If you have a high blood pressure reading during one visit or you have normal blood pressure with other risk factors, you may be asked to: Return on a different day to have your blood pressure checked again. Monitor your blood pressure at home for 1 week or longer. If you are diagnosed with hypertension, you may have other blood or imaging tests to help your health care provider understand your overall risk for other conditions. How is this treated? This condition is treated by making healthy lifestyle changes, such as eating healthy foods, exercising more, and reducing your alcohol intake. You may be referred for counseling on a healthy diet and physical activity. Your health care provider may prescribe medicine if lifestyle changes are not enough to get your blood pressure under control and if: Your systolic blood pressure is above 130. Your diastolic blood pressure is above 80. Your personal target blood pressure may vary depending on your medical conditions, your age, and other factors. Follow these instructions  at home: Eating and drinking  Eat a diet that is high in fiber and potassium, and low in sodium, added sugar, and fat. An example of this eating plan is called the DASH diet. DASH  stands for Dietary Approaches to Stop Hypertension. To eat this way: Eat plenty of fresh fruits and vegetables. Try to fill one half of your plate at each meal with fruits and vegetables. Eat whole grains, such as whole-wheat pasta, brown rice, or whole-grain bread. Fill about one fourth of your plate with whole grains. Eat or drink low-fat dairy products, such as skim milk or low-fat yogurt. Avoid fatty cuts of meat, processed or cured meats, and poultry with skin. Fill about one fourth of your plate with lean proteins, such as fish, chicken without skin, beans, eggs, or tofu. Avoid pre-made and processed foods. These tend to be higher in sodium, added sugar, and fat. Reduce your daily sodium intake. Many people with hypertension should eat less than 1,500 mg of sodium a day. Do not drink alcohol if: Your health care provider tells you not to drink. You are pregnant, may be pregnant, or are planning to become pregnant. If you drink alcohol: Limit how much you have to: 0-1 drink a day for women. 0-2 drinks a day for men. Know how much alcohol is in your drink. In the [place] one drink equals one 12 oz bottle of beer (355 mL), one 5 oz glass of wine (148 mL), or one 1 oz glass of hard liquor (44 mL). Lifestyle  Work with your health care provider to maintain a healthy body weight or to lose weight. Ask what an ideal weight is for you. Get at least 30 minutes of exercise that causes your heart to beat faster (aerobic exercise) most days of the week. Activities may include walking, swimming, or biking. Include exercise to strengthen your muscles (resistance exercise), such as Pilates or lifting weights, as part of your weekly exercise routine. Try to do these types of exercises for 30 minutes at least 3 days a week. Do not use any products that contain nicotine or tobacco. These products include cigarettes, chewing tobacco, and vaping devices, such as e-cigarettes. If you need help quitting, ask your  health care provider. Monitor your blood pressure at home as told by your health care provider. Keep all follow-up visits. This is important. Medicines Take over-the-counter and prescription medicines only as told by your health care provider. Follow directions carefully. Blood pressure medicines must be taken as prescribed. Do not skip doses of blood pressure medicine. Doing this puts you at risk for problems and can make the medicine less effective. Ask your health care provider about side effects or reactions to medicines that you should watch for. Contact a health care provider if you: Think you are having a reaction to a medicine you are taking. Have headaches that keep coming back (recurring). Feel dizzy. Have swelling in your ankles. Have trouble with your vision. Get help right away if you: Develop a severe headache or confusion. Have unusual weakness or numbness. Feel faint. Have severe pain in your chest or abdomen. Vomit repeatedly. Have trouble breathing. These symptoms may be an emergency. Get help right away. Call 911. Do not wait to see if the symptoms will go away. Do not drive yourself to the hospital. Summary Hypertension is when the force of blood pumping through your arteries is too strong. If this condition is not controlled, it may put you at risk for serious complications.  Your personal target blood pressure may vary depending on your medical conditions, your age, and other factors. For most people, a normal blood pressure is less than 120/80. Hypertension is treated with lifestyle changes, medicines, or a combination of both. Lifestyle changes include losing weight, eating a healthy, low-sodium diet, exercising more, and limiting alcohol. This information is not intended to replace advice given to you by your health care provider. Make sure you discuss any questions you have with your health care provider. Document Revised: 10/24/2021 Document Reviewed:  10/24/2021 Elsevier Patient Education  2024 [employer].

## 2023-12-04 NOTE — Assessment & Plan Note (Signed)
Uncontrolled hypertension Elevated blood pressure readings in the office and at home Asymptomatic Cardiovascular risks associated with hypertension discussed Heart rate in the low 60s.  Recommend to continue atenolol 25 mg daily Recommend to increase thiazide diuretic Lozol to 2.5 mg daily Advised to continue monitoring blood pressure readings at home daily for the next several weeks and contact the office if numbers persistently abnormal Recommend follow-up with PCP

## 2023-12-06 ENCOUNTER — Other Ambulatory Visit: Payer: 59

## 2023-12-13 ENCOUNTER — Ambulatory Visit (INDEPENDENT_AMBULATORY_CARE_PROVIDER_SITE_OTHER): Payer: 59 | Admitting: Family Medicine

## 2023-12-13 ENCOUNTER — Encounter: Payer: Self-pay | Admitting: Family Medicine

## 2023-12-13 DIAGNOSIS — R1031 Right lower quadrant pain: Secondary | ICD-10-CM

## 2023-12-13 DIAGNOSIS — K5900 Constipation, unspecified: Secondary | ICD-10-CM | POA: Diagnosis not present

## 2023-12-13 NOTE — Patient Instructions (Addendum)
I would try Colace to help soften stool.  May use miralax as needed.  We are checking labs today, will be in contact with any results that require further attention  Follow-up with me for new or worsening symptoms.

## 2023-12-13 NOTE — Progress Notes (Signed)
Acute Office Visit  Subjective:     Patient ID: Tina Stone, female    DOB: [birth year], 73 y.o.   MRN: [id number]  Chief Complaint  Patient presents with   Abdominal Pain    PT notes of pain when eating foods, digesting foods, as well as constipation x 2 weeks. Pain lessens after having a bowel movement. No dietary changes. PT did note that this might have started when they began their indapamide script    HPI Patient is in today for RLQ abdominal pain for the last 2 weeks. States she has recently started taking indapamide, she is wondering if she is not drinking enough fluids to mitigate the diuretic effects of this medication.. Also has history of H. pylori, states this feels very similar and requesting testing today. Reports that she is passing gas. Has not been taking any stool softeners or laxatives. Denies known sick contacts. Denies nausea, vomiting, diarrhea, rash, chills, fever, other symptoms. Medical history as outlined below.   ROS Per HPI      Objective:    BP (!) 128/90   Pulse 62   Temp 97.7 F (36.5 C) (Oral)   Ht 5\' 2"  (1.575 m)   Wt 220 lb 6.4 oz (100 kg)   SpO2 94%   BMI 40.31 kg/m    Physical Exam Vitals and nursing note reviewed.  Constitutional:      General: She is not in acute distress.    Appearance: Normal appearance. She is normal weight.  HENT:     Head: Normocephalic and atraumatic.     Right Ear: Tympanic membrane and ear canal normal.     Left Ear: Tympanic membrane and ear canal normal.     Nose: Nose normal.  Eyes:     Extraocular Movements: Extraocular movements intact.     Pupils: Pupils are equal, round, and reactive to light.  Cardiovascular:     Rate and Rhythm: Normal rate and regular rhythm.  Pulmonary:     Effort: Pulmonary effort is normal.  Abdominal:     General: Bowel sounds are normal. There is no distension or abdominal bruit. There are no signs of injury.     Palpations: Abdomen is soft.      Comments: Mildly tender to deep palpation at RLQ, non tender at McBurney's point, - Murphy sign, - Rosvings  Musculoskeletal:        General: Normal range of motion.     Cervical back: Normal range of motion.  Skin:    General: Skin is warm and dry.  Neurological:     General: No focal deficit present.     Mental Status: She is alert and oriented to person, place, and time.  Psychiatric:        Mood and Affect: Mood normal.        Thought Content: Thought content normal.     No results found for any visits on 12/13/23.      Assessment & Plan:  1. RLQ abdominal pain  - H. pylori breath test  2. Constipation, unspecified constipation type  Printed edu to patient at d/c  I would try Colace to help soften stool.  May use miralax as needed.  We are checking labs today, will be in contact with any results that require further attention  Follow-up with me for new or worsening symptoms.  No orders of the defined types were placed in this encounter.   Return if symptoms worsen or fail to improve.  Moshe Cipro, FNP

## 2023-12-18 ENCOUNTER — Encounter: Payer: Self-pay | Admitting: Internal Medicine

## 2023-12-18 LAB — H. PYLORI BREATH TEST: H. pylori Breath Test: NOT DETECTED

## 2023-12-19 ENCOUNTER — Telehealth: Payer: Self-pay

## 2023-12-19 NOTE — Telephone Encounter (Signed)
Copied from CRM [id number]. Topic: General - Other >> Dec 19, 2023 10:48 AM Corin V wrote: Reason for CRM: Patient called and stated she had been speaking to Dr. Yetta Barre' nurse, Jaz, and had gotten disconnected and was calling back. Attempted to transfer to office but Jaz had gone into a room with a patient. Advised patient that Cheron Every would return call as soon as possible.

## 2023-12-19 NOTE — Telephone Encounter (Signed)
Patient has been made aware of her lab results.

## 2023-12-19 NOTE — Telephone Encounter (Signed)
Unable to reach patient. LMTRC  

## 2023-12-23 ENCOUNTER — Other Ambulatory Visit: Payer: 59 | Admitting: Pharmacist

## 2023-12-23 DIAGNOSIS — I10 Essential (primary) hypertension: Secondary | ICD-10-CM

## 2023-12-23 NOTE — Progress Notes (Signed)
12/23/2023 Name: Tina Stone MRN: [id number] DOB: 1950/08/03  Chief Complaint  Patient presents with   Hypertension   Medication Management     Tina Stone is a 73 y.o. year old female who presented for a telephone visit.   They were referred to the pharmacist by their PCP for assistance in managing hypertension.    Subjective:  Care Team: Primary Care Provider: Etta Grandchild, MD ; Next Scheduled Visit: none scheduled  Medication Access/Adherence  Current Pharmacy:  [id number] - [place], [place] - [address] [place] [address] Phone: [phone] Fax: [phone]  [id number] - [place], [place] - [address] [place] [address] Phone: [phone] Fax: [phone]   Patient reports affordability concerns with their medications: No  Patient reports access/transportation concerns to their pharmacy: No  Patient reports adherence concerns with their medications:  Yes     Hypertension:  Current medications: atenolol 25 mg daily Only taking indapamide 1.25 mg about every other day  *She had dizziness when she was changed to nebivolol plus indapamide. She was switched back to atenolol and was told to continue indapamide  Patient has a validated, automated, upper arm home BP cuff Current blood pressure readings readings: none recent *She notes her previously BP monitor was giving high readings when compared to in office reading. She got a new monitor but has not checked BP at home yet with it  Patient denies hypotensive s/sx including dizziness, lightheadedness.  Patient denies hypertensive symptoms including headache, chest pain, shortness of breath   Objective: BP Readings from Last 3 Encounters:  12/13/23 (!) 128/90  12/04/23 (!) 140/82  11/22/23 (!) 146/85     Lab Results  Component Value Date   HGBA1C 5.3 02/02/2022    Lab Results  Component Value Date    CREATININE 0.93 09/20/2023   BUN 11 09/20/2023   NA 139 09/20/2023   K 3.9 09/20/2023   CL 105 09/20/2023   CO2 29 09/20/2023    Lab Results  Component Value Date   CHOL 201 (H) 08/16/2023   HDL 79 08/16/2023   LDLCALC 112 (H) 08/16/2023   TRIG 53 08/16/2023   CHOLHDL 2.5 08/16/2023    Medications Reviewed Today     Reviewed by Bonita Quin, RPH (Pharmacist) on 12/23/23 at 1207  Med List Status: <None>   Medication Order Taking? Sig Documenting Provider Last Dose Status Informant  acetaminophen (TYLENOL) 500 MG tablet [id number]  Take 1,000 mg by mouth every 6 (six) hours as needed for moderate pain. [provider]  Active Self  APPLE CIDER VINEGAR PO [id number]  Take 5 mLs by mouth as needed.  Patient not taking: Reported on 12/13/2023   [provider]  Active Self  atenolol (TENORMIN) 25 MG tablet [id number] Yes Take 1 tablet (25 mg total) by mouth every evening. Dulce Sellar, NP Taking Active   Cholecalciferol (VITAMIN D3) 50 MCG (2000 UT) TABS [id number]  Take by mouth. [provider]  Active   ELIQUIS 5 MG TABS tablet [id number]  TAKE 1 TABLET BY MOUTH TWICE A DAY Etta Grandchild, MD  Active   Evolocumab (REPATHA SURECLICK) 140 MG/ML Ivory Broad [id number]  Inject 140 mg into the skin every 14 (fourteen) days. Chrystie Nose, MD  Active   indapamide (LOZOL) 1.25 MG tablet [id number] Yes Take 1 tablet (1.25 mg total) by mouth daily. Etta Grandchild, MD Taking Active  Med Note Michele Rockers Dec 23, 2023 12:07 PM) Patient is taking every other day  levothyroxine (SYNTHROID) 50 MCG tablet [id number]  Take 1 tablet (50 mcg total) by mouth daily. Etta Grandchild, MD  Active   Multiple Vitamin (MULTIVITAMIN) capsule [id number]  Take 1 capsule by mouth daily. [provider]  Active Self  Clent Demark [id number]  Herbal Ginger and lemon tea [provider]  Active   zinc gluconate 50 MG tablet [id number]  Take 50 mg  by mouth daily. [provider]  Active               Assessment/Plan:   Hypertension: - Currently uncontrolled, BP goal <130/80 without hypotension sx - Reviewed long term cardiovascular and renal outcomes of uncontrolled blood pressure - Reviewed appropriate blood pressure monitoring technique and reviewed goal blood pressure. Recommended to check home blood pressure and heart rate daily and keep a log - Recommend to continue current regimen. Noted especially if BP frequently >130/80, taking indapamide daily is recommended   She has upcoming PCP appt on 1/9  Follow Up Plan: PRN  Arbutus Leas, PharmD, BCPS, CPP Clinical Pharmacist Practitioner [facility] [phone]

## 2024-01-03 ENCOUNTER — Ambulatory Visit (INDEPENDENT_AMBULATORY_CARE_PROVIDER_SITE_OTHER): Payer: 59 | Admitting: Urgent Care

## 2024-01-03 VITALS — BP 152/84 | HR 64 | Temp 97.8°F | Ht 62.0 in | Wt 222.8 lb

## 2024-01-03 DIAGNOSIS — R051 Acute cough: Secondary | ICD-10-CM | POA: Diagnosis not present

## 2024-01-03 DIAGNOSIS — J22 Unspecified acute lower respiratory infection: Secondary | ICD-10-CM

## 2024-01-03 DIAGNOSIS — R519 Headache, unspecified: Secondary | ICD-10-CM

## 2024-01-03 LAB — POC COVID19 BINAXNOW: SARS Coronavirus 2 Ag: NEGATIVE

## 2024-01-03 MED ORDER — GUAIFENESIN ER 600 MG PO TB12: 600.0000 mg | ORAL_TABLET | Freq: Two times a day (BID) | ORAL | 0 refills | Status: DC | PRN

## 2024-01-03 MED ORDER — DOXYCYCLINE HYCLATE 100 MG PO TABS: 100.0000 mg | ORAL_TABLET | Freq: Two times a day (BID) | ORAL | 0 refills | Status: AC

## 2024-01-03 NOTE — Progress Notes (Signed)
 Established Patient Office Visit  Subjective:  Patient ID: Tina Stone, female    DOB: November 28, 1950  Age: 74 y.o. MRN: [id number]  Chief Complaint  Patient presents with   Sore Throat    Onset one week  Cough, sore throat, mild headache     73yo female who has been vaccinated against COVID-19, reports exposure to individuals who tested positive for the virus approximately a week ago. Following the exposure, she developed a sore throat, which has persisted for seven days. A few days later, she began experiencing intermittent coughing and mild headaches. The cough is described as dry, and the patient denies any production of phlegm. She has not taken any over-the-counter medications except for cough drops and has been maintaining hydration. The patient denies any known fever, ear pain, or additional problems. She also reports a history of sinus infections and a sensation of drainage down the back of her throat.    Patient Active Problem List   Diagnosis Date Noted   History of colonic polyps 09/13/2023   Left lumbar radiculopathy 08/19/2023   Hyponatremia 08/19/2023   Low mean corpuscular volume (MCV) 08/19/2023   Acute bilateral back pain 08/09/2023   Hematuria 08/09/2023   Gastritis 06/02/2023   Constipation 06/02/2023   Abdominal pain 04/01/2023   Congestion of nasal sinus 02/01/2023   Polyp of colon 01/09/2023   Benign liver cyst 12/21/2022   Right hip pain 08/28/2022   Aortic atherosclerosis (HCC) 08/01/2022   Chronic idiopathic urticaria 11/22/2021   Chronic anticoagulation 10/02/2021   Visit for screening mammogram 08/18/2021   Acquired hypothyroidism 08/18/2021   Allergic rhinoconjunctivitis 08/18/2021   Encounter for general adult medical examination with abnormal findings 08/18/2021   Hyperlipidemia with target LDL less than 100 08/17/2021   Laryngitis, chronic 08/17/2021   Paroxysmal atrial fibrillation (HCC) 02/03/2021   Secondary hypercoagulable state (HCC)  02/03/2021   Essential hypertension 11/16/2020   Vitamin D  deficiency 10/27/2020   Endometrial cancer (HCC) 10/22/2019   Upper respiratory infection, viral 04/01/2018   Acute cough 02/02/2014   Beta thalassemia trait 11/25/2013   Class II obesity 11/19/2012   Gastroesophageal reflux disease 01/28/2007   Past Medical History:  Diagnosis Date   Adenomatous colon polyp    Allergic rhinitis, seasonal    Allergy    Atrial fibrillation (HCC)    Beta thalassemia trait 11/25/2013   Cholelithiasis    Class 3 obesity without serious comorbidity with body mass index (BMI) of 40.0 to 44.9 in adult 11/19/2012   DDD (degenerative disc disease), lumbar    endometrial ca dx'd 08/2009   endometrial    GERD (gastroesophageal reflux disease)    H. pylori infection    History of radiation therapy    endometrial - 02/19/2022-03/23/2022 Dr Lynwood Nasuti   HLD (hyperlipidemia)    Hypercholesterolemia    Hypertension 03/18/2017   no meds    Hypothyroidism    Intraductal papilloma of left breast    Patient underwent left needle-localized lumpectomy by Dr. Donnice POUR. Tsuei on 09/09/2013; pathology showed intraductal papilloma with no atypia or malignancy identified.   Morbid (severe) obesity due to excess calories (HCC) 09/27/2023   bmi 40.38   PONV (postoperative nausea and vomiting)    Pre-diabetes    pt denies   Sciatica    Spinal stenosis    Urticaria    Uterine fibroid    Social History   Tobacco Use   Smoking status: Never   Smokeless tobacco: Never   Tobacco comments:  few puffs but not a true smoker quit many yrs ago  Vaping Use   Vaping status: Never Used  Substance Use Topics   Alcohol use: Never   Drug use: Never      ROS: as noted in HPI  Objective:     BP (!) 152/84 (BP Location: Left Arm, Patient Position: Sitting, Cuff Size: Large)   Pulse 64   Temp 97.8 F (36.6 C) (Oral)   Ht 5' 2 (1.575 m)   Wt 222 lb 12.8 oz (101.1 kg)   SpO2 99%   BMI 40.75 kg/m  BP  Readings from Last 3 Encounters:  01/03/24 (!) 152/84  12/13/23 (!) 128/90  12/04/23 (!) 140/82   Wt Readings from Last 3 Encounters:  01/03/24 222 lb 12.8 oz (101.1 kg)  12/13/23 220 lb 6.4 oz (100 kg)  12/04/23 218 lb (98.9 kg)      Physical Exam Vitals reviewed.  Constitutional:      Appearance: Normal appearance. She is not ill-appearing, toxic-appearing or diaphoretic.  HENT:     Head: Normocephalic.     Right Ear: No swelling or tenderness. No middle ear effusion. No mastoid tenderness. Tympanic membrane is not injected, scarred, perforated, erythematous, retracted or bulging.     Left Ear: No swelling or tenderness. A middle ear effusion is present. No mastoid tenderness. Tympanic membrane is not injected, scarred, perforated, erythematous, retracted or bulging.     Nose: Congestion and rhinorrhea present.     Right Turbinates: Not enlarged or swollen.     Left Turbinates: Not enlarged or swollen.     Right Sinus: Maxillary sinus tenderness present. No frontal sinus tenderness.     Left Sinus: Maxillary sinus tenderness present. No frontal sinus tenderness.     Mouth/Throat:     Lips: Pink.     Pharynx: Oropharynx is clear. Uvula midline. Postnasal drip present. No pharyngeal swelling, oropharyngeal exudate, posterior oropharyngeal erythema or uvula swelling.  Cardiovascular:     Rate and Rhythm: Normal rate and regular rhythm.  Pulmonary:     Effort: Pulmonary effort is normal. No respiratory distress.     Breath sounds: No stridor. Rhonchi (posterior lung fields, bibasilar) present. No wheezing.  Chest:     Chest wall: No tenderness.  Musculoskeletal:     Cervical back: Normal range of motion. No rigidity.  Lymphadenopathy:     Cervical: No cervical adenopathy.  Skin:    General: Skin is warm and dry.     Findings: No erythema or rash.  Neurological:     General: No focal deficit present.     Mental Status: She is alert and oriented to person, place, and time.       Results for orders placed or performed in visit on 01/03/24  POC COVID-19 BinaxNow  Result Value Ref Range   SARS Coronavirus 2 Ag Negative Negative    Last CBC Lab Results  Component Value Date   WBC 3.9 (L) 09/20/2023   HGB 12.1 09/20/2023   HCT 36.7 09/20/2023   MCV 76.6 (L) 09/20/2023   MCH 25.3 (L) 09/20/2023   RDW 15.6 (H) 09/20/2023   PLT 184 09/20/2023   Last metabolic panel Lab Results  Component Value Date   GLUCOSE 92 09/20/2023   NA 139 09/20/2023   K 3.9 09/20/2023   CL 105 09/20/2023   CO2 29 09/20/2023   BUN 11 09/20/2023   CREATININE 0.93 09/20/2023   GFRNONAA >60 09/20/2023   CALCIUM  9.1 09/20/2023  PROT 6.8 09/20/2023   ALBUMIN 3.7 09/20/2023   LABGLOB 2.5 08/16/2023   BILITOT 0.5 09/20/2023   ALKPHOS 87 09/20/2023   AST 17 09/20/2023   ALT 11 09/20/2023   ANIONGAP 5 09/20/2023      The 10-year ASCVD risk score (Arnett DK, et al., 2019) is: 17.5%  Assessment & Plan:  Acute cough -     POC COVID-19 BinaxNow -     guaiFENesin  ER; Take 1 tablet (600 mg total) by mouth 2 (two) times daily as needed for cough or to loosen phlegm.  Dispense: 20 tablet; Refill: 0  Acute nonintractable headache, unspecified headache type  Acute lower respiratory tract infection -     Doxycycline  Hyclate; Take 1 tablet (100 mg total) by mouth 2 (two) times daily for 7 days.  Dispense: 14 tablet; Refill: 0   Assessment and Plan Acute lower respiratory infection with possible early developing sinusitis: Recent exposure to COVID-19 positive individuals with onset of symptoms (sore throat, cough, headache) within the past week. No fever or respiratory distress. Hx of sinus infections -COVID-19 test negative. -doxycycline  to cover for sinusitis or possible atypical lower respiratory infection. -mucinex  to help break up mucous   No follow-ups on file.   Benton LITTIE Gave, PA

## 2024-01-03 NOTE — Patient Instructions (Signed)
 You have a lower respiratory tract infection. Your covid swab is negative.  Please start taking the antibiotic, doxycycline , twice daily x 7 days. Take it an hour before or two hours after eating any food with diary, or a multivitamin.  Take mucinex  (called in today) twice daily with plenty of water . This will help break up any phlegm/ mucous.  Please purchase a saline sinus rinse and flush your nasal passages out. This will help with your post nasal drainage and sore throat.  Hot steam from a shower or vaporizer may also be beneficial to help open up the upper airway. Eucalyptus can be helpful.  Over the counter Quercetin 500mg  with zinc 50mg  can help boost your immune system to get over the infection.  If any worsening symptoms such as persistent headache, fever, or shortness of breath, please return for recheck.

## 2024-01-04 ENCOUNTER — Other Ambulatory Visit: Payer: Self-pay | Admitting: Internal Medicine

## 2024-01-04 DIAGNOSIS — I10 Essential (primary) hypertension: Secondary | ICD-10-CM

## 2024-01-04 DIAGNOSIS — E039 Hypothyroidism, unspecified: Secondary | ICD-10-CM

## 2024-01-08 ENCOUNTER — Other Ambulatory Visit: Payer: Self-pay | Admitting: Internal Medicine

## 2024-01-08 DIAGNOSIS — E039 Hypothyroidism, unspecified: Secondary | ICD-10-CM

## 2024-01-08 DIAGNOSIS — I10 Essential (primary) hypertension: Secondary | ICD-10-CM

## 2024-01-08 MED ORDER — INDAPAMIDE 1.25 MG PO TABS: 1.2500 mg | ORAL_TABLET | Freq: Every day | ORAL | 0 refills | Status: DC

## 2024-01-08 MED ORDER — LEVOTHYROXINE SODIUM 50 MCG PO TABS: 50.0000 ug | ORAL_TABLET | Freq: Every day | ORAL | 0 refills | Status: DC

## 2024-01-09 ENCOUNTER — Encounter: Payer: Self-pay | Admitting: Internal Medicine

## 2024-01-09 ENCOUNTER — Ambulatory Visit (INDEPENDENT_AMBULATORY_CARE_PROVIDER_SITE_OTHER): Payer: 59 | Admitting: Internal Medicine

## 2024-01-09 ENCOUNTER — Ambulatory Visit (INDEPENDENT_AMBULATORY_CARE_PROVIDER_SITE_OTHER): Payer: 59

## 2024-01-09 VITALS — BP 144/82 | HR 64 | Temp 98.4°F | Resp 16 | Ht 62.0 in | Wt 221.0 lb

## 2024-01-09 DIAGNOSIS — R052 Subacute cough: Secondary | ICD-10-CM | POA: Diagnosis not present

## 2024-01-09 DIAGNOSIS — J22 Unspecified acute lower respiratory infection: Secondary | ICD-10-CM

## 2024-01-09 DIAGNOSIS — R0989 Other specified symptoms and signs involving the circulatory and respiratory systems: Secondary | ICD-10-CM | POA: Diagnosis not present

## 2024-01-09 DIAGNOSIS — R059 Cough, unspecified: Secondary | ICD-10-CM | POA: Diagnosis not present

## 2024-01-09 MED ORDER — HYDROCODONE BIT-HOMATROP MBR 5-1.5 MG/5ML PO SOLN: 5.0000 mL | Freq: Three times a day (TID) | ORAL | 0 refills | Status: AC | PRN

## 2024-01-09 NOTE — Progress Notes (Signed)
 Subjective:  Patient ID: Tina Stone, female    DOB: Sep 19, 1950  Age: 74 y.o. MRN: [id number]  CC: Cough and Hypertension   HPI Tina Stone presents for f/up  ----  Discussed the use of AI scribe software for clinical note transcription with the patient, who gave verbal consent to proceed.  History of Present Illness   The patient, on a regimen of Synthroid  75 micrograms instead of 50, reports non-compliance with her prescribed diuretic, attributing this to her elevated blood pressure. She acknowledges the need to resume the medication. She denies experiencing headaches, blurred vision, chest pain, shortness of breath, or lower extremity edema.  She had a recent cardiology appointment in August, where it was noted that her cholesterol levels were decreasing, albeit not as rapidly as desired. She has recently started a course of Doxycycline  for an upper respiratory infection, presenting with a cough of approximately 1.5 weeks duration. The cough has been productive of clear sputum, with no associated fever, chills, or night sweats.  The patient also reports no instances of bleeding or bruising. She expresses a desire for a cough suppressant to alleviate her current symptoms. She also mentions a preference for swimming as a form of exercise, but has abstained due to her current respiratory symptoms.       Outpatient Medications Prior to Visit  Medication Sig Dispense Refill   acetaminophen  (TYLENOL ) 500 MG tablet Take 1,000 mg by mouth every 6 (six) hours as needed for moderate pain.     APPLE CIDER VINEGAR PO Take 5 mLs by mouth as needed.     atenolol  (TENORMIN ) 25 MG tablet Take 1 tablet (25 mg total) by mouth every evening. 30 tablet 2   Cholecalciferol (VITAMIN D3) 50 MCG (2000 UT) TABS Take by mouth.     doxycycline  (VIBRA -TABS) 100 MG tablet Take 1 tablet (100 mg total) by mouth 2 (two) times daily for 7 days. 14 tablet 0   ELIQUIS  5 MG TABS tablet TAKE 1 TABLET BY MOUTH  TWICE A DAY 60 tablet 0   Evolocumab  (REPATHA  SURECLICK) 140 MG/ML SOAJ Inject 140 mg into the skin every 14 (fourteen) days. 2 mL 11   indapamide  (LOZOL ) 1.25 MG tablet Take 1 tablet (1.25 mg total) by mouth daily. 90 tablet 0   levothyroxine  (SYNTHROID ) 75 MCG tablet Take 75 mcg by mouth every morning.     Multiple Vitamin (MULTIVITAMIN) capsule Take 1 capsule by mouth daily.     NON FORMULARY Herbal Ginger and lemon tea     zinc gluconate 50 MG tablet Take 50 mg by mouth daily.     levothyroxine  (SYNTHROID ) 50 MCG tablet Take 1 tablet (50 mcg total) by mouth daily. (Patient not taking: Reported on 01/09/2024) 90 tablet 0   guaiFENesin  (MUCINEX ) 600 MG 12 hr tablet Take 1 tablet (600 mg total) by mouth 2 (two) times daily as needed for cough or to loosen phlegm. 20 tablet 0   No facility-administered medications prior to visit.    ROS Review of Systems  Constitutional:  Negative for appetite change, chills, diaphoresis, fatigue and fever.  HENT: Negative.  Negative for sore throat and trouble swallowing.   Eyes: Negative.   Respiratory:  Positive for cough. Negative for chest tightness, shortness of breath, wheezing and stridor.   Cardiovascular:  Negative for chest pain and leg swelling.  Gastrointestinal:  Negative for abdominal pain, constipation, diarrhea, nausea and vomiting.  Endocrine: Negative.   Genitourinary: Negative.  Negative for difficulty urinating.  Musculoskeletal: Negative.  Negative for myalgias.  Skin: Negative.  Negative for color change and pallor.  Neurological:  Negative for dizziness and weakness.  Hematological:  Negative for adenopathy. Does not bruise/bleed easily.  Psychiatric/Behavioral: Negative.      Objective:  BP (!) 144/82 (BP Location: Left Arm, Patient Position: Sitting, Cuff Size: Large)   Pulse 64   Temp 98.4 F (36.9 C) (Oral)   Resp 16   Ht 5' 2 (1.575 m)   Wt 221 lb (100.2 kg)   SpO2 100%   BMI 40.42 kg/m   BP Readings from Last 3  Encounters:  01/09/24 (!) 144/82  01/03/24 (!) 152/84  12/13/23 (!) 128/90    Wt Readings from Last 3 Encounters:  01/09/24 221 lb (100.2 kg)  01/03/24 222 lb 12.8 oz (101.1 kg)  12/13/23 220 lb 6.4 oz (100 kg)    Physical Exam Vitals reviewed.  Constitutional:      Appearance: Normal appearance.  HENT:     Mouth/Throat:     Mouth: Mucous membranes are moist.  Eyes:     General: No scleral icterus.    Conjunctiva/sclera: Conjunctivae normal.  Cardiovascular:     Rate and Rhythm: Normal rate and regular rhythm.     Pulses: Normal pulses.     Heart sounds: No murmur heard.    No friction rub. No gallop.  Pulmonary:     Effort: Pulmonary effort is normal.     Breath sounds: No stridor. No wheezing, rhonchi or rales.  Abdominal:     General: Abdomen is flat.     Palpations: There is no mass.     Tenderness: There is no abdominal tenderness. There is no guarding.     Hernia: No hernia is present.  Musculoskeletal:        General: Normal range of motion.     Cervical back: Neck supple.     Right lower leg: No edema.     Left lower leg: No edema.  Lymphadenopathy:     Cervical: No cervical adenopathy.  Skin:    General: Skin is warm and dry.  Neurological:     General: No focal deficit present.     Mental Status: She is alert. Mental status is at baseline.  Psychiatric:        Mood and Affect: Mood normal.        Behavior: Behavior normal.     Lab Results  Component Value Date   WBC 3.9 (L) 09/20/2023   HGB 12.1 09/20/2023   HCT 36.7 09/20/2023   PLT 184 09/20/2023   GLUCOSE 92 09/20/2023   CHOL 201 (H) 08/16/2023   TRIG 53 08/16/2023   HDL 79 08/16/2023   LDLCALC 112 (H) 08/16/2023   ALT 11 09/20/2023   AST 17 09/20/2023   NA 139 09/20/2023   K 3.9 09/20/2023   CL 105 09/20/2023   CREATININE 0.93 09/20/2023   BUN 11 09/20/2023   CO2 29 09/20/2023   TSH 1.44 11/11/2023   INR 1.0 01/28/2021   HGBA1C 5.3 02/02/2022    CT ABDOMEN PELVIS WO  CONTRAST Result Date: 09/26/2023 CLINICAL DATA:  Endometrial/cervical cancer restaging. History of left breast lumpectomy. Prior chemotherapy. * Tracking Code: BO * EXAM: CT ABDOMEN AND PELVIS WITHOUT CONTRAST TECHNIQUE: Multidetector CT imaging of the abdomen and pelvis was performed following the standard protocol without IV contrast. RADIATION DOSE REDUCTION: This exam was performed according to the departmental dose-optimization program which includes automated exposure control, adjustment of the  mA and/or kV according to patient size and/or use of iterative reconstruction technique. COMPARISON:  04/08/2023 FINDINGS: Lower chest: Postoperative findings, left breast. Mild left anterior descending coronary artery atheromatous vascular disease. Hepatobiliary: No change in bilateral hypodense hepatic lesions compatible with cysts. Cholecystectomy. Stable mild prominence of the extrahepatic biliary tree, likely a physiologic response to cholecystectomy. Pancreas: Unremarkable Spleen: Unremarkable Adrenals/Urinary Tract: Unremarkable Stomach/Bowel: Sigmoid colon diverticulosis. Vascular/Lymphatic: Atherosclerosis is present, including aortoiliac atherosclerotic disease. No pathologic adenopathy observed. Reproductive: Uterus absent. Adnexa unremarkable. No mass along the vaginal cuff. Other: No supplemental non-categorized findings. Musculoskeletal: Chronic symmetric bilateral sacroiliitis. Chronically stable sclerosis in the right femoral neck and right greater trochanter. Lower thoracic and lumbar spondylosis and degenerative disc disease with grade 1 anterolisthesis of L4 on L5. Suspected multilevel foraminal impingement in the lumbar spine. Grade 1 degenerative retrolisthesis at T12-L1 and L1-2. IMPRESSION: 1. No findings of active malignancy. 2. Aortic atherosclerosis. 3. Chronic symmetric bilateral sacroiliitis. 4. Chronic sclerosis in the right femoral neck and right greater trochanter. This is been present at  least for 3 years and may be from prior trauma or prior radiation necrosis. This would be an unusual metastatic lesion for cervical/endometrial cancer particularly given the nonprogressive nature of the last couple of years. 5. Sigmoid colon diverticulosis. 6. Lower thoracic and lumbar spondylosis and degenerative disc disease with grade 1 anterolisthesis of L4 on L5. Suspected multilevel foraminal impingement in the lumbar spine. Aortic Atherosclerosis (ICD10-I70.0). Electronically Signed   By: Ryan Salvage M.D.   On: 09/26/2023 15:06   DG Chest 2 View Result Date: 01/09/2024 CLINICAL DATA:  Cough for 10 days.  Congestion. EXAM: CHEST - 2 VIEW COMPARISON:  04/01/2023 FINDINGS: Both lungs are clear without airspace disease or pulmonary edema. Heart and mediastinum are within normal limits. Trachea is midline. No large pleural effusions. No acute bone abnormality. Negative for a pneumothorax. IMPRESSION: No active cardiopulmonary disease. Electronically Signed   By: Juliene Balder M.D.   On: 01/09/2024 08:41     Assessment & Plan:   LRTI (lower respiratory tract infection)- Will continue the Doxy. -     HYDROcodone  Bit-Homatrop MBr; Take 5 mLs by mouth every 8 (eight) hours as needed for up to 8 days for cough.  Dispense: 120 mL; Refill: 0  Subacute cough- CXR is negative for mass/infiltrate. -     DG Chest 2 View; Future -     HYDROcodone  Bit-Homatrop MBr; Take 5 mLs by mouth every 8 (eight) hours as needed for up to 8 days for cough.  Dispense: 120 mL; Refill: 0     Follow-up: Return in about 3 months (around 04/08/2024).  Debby Molt, MD

## 2024-01-09 NOTE — Patient Instructions (Signed)

## 2024-01-17 ENCOUNTER — Telehealth: Payer: Self-pay | Admitting: Internal Medicine

## 2024-01-17 ENCOUNTER — Telehealth: Payer: Self-pay

## 2024-01-17 ENCOUNTER — Other Ambulatory Visit: Payer: Self-pay

## 2024-01-17 DIAGNOSIS — I480 Paroxysmal atrial fibrillation: Secondary | ICD-10-CM

## 2024-01-17 DIAGNOSIS — I700 Atherosclerosis of aorta: Secondary | ICD-10-CM

## 2024-01-17 DIAGNOSIS — D6869 Other thrombophilia: Secondary | ICD-10-CM

## 2024-01-17 DIAGNOSIS — E7800 Pure hypercholesterolemia, unspecified: Secondary | ICD-10-CM

## 2024-01-17 DIAGNOSIS — E785 Hyperlipidemia, unspecified: Secondary | ICD-10-CM

## 2024-01-17 MED ORDER — APIXABAN 5 MG PO TABS: 5.0000 mg | ORAL_TABLET | Freq: Two times a day (BID) | ORAL | 1 refills | Status: DC

## 2024-01-17 MED ORDER — REPATHA SURECLICK 140 MG/ML ~~LOC~~ SOAJ: 140.0000 mg | SUBCUTANEOUS | 11 refills | Status: DC

## 2024-01-17 MED ORDER — LEVOTHYROXINE SODIUM 75 MCG PO TABS: 75.0000 ug | ORAL_TABLET | Freq: Every morning | ORAL | 1 refills | Status: DC

## 2024-01-17 NOTE — Telephone Encounter (Signed)
Copied from CRM [id number]. Topic: Clinical - Prescription Issue >> Jan 17, 2024  8:51 AM Josefa Half C wrote: Reason for CRM: Patient was seen in the office on 01/09/2024 and her prescription for levothyroxine (SYNTHROID) was supposed to be changed back to 75 MCG tablets instead of 50 MCG tablets. Patient also needs a refill for ELIQUIS 5 MG TABS tablets. Please submit both updated RX refills to patient preferred pharmacy-  [id number] - [place], [place] - [address]  [address] [place] [address]  Phone: [phone]  Fax: [phone]

## 2024-01-17 NOTE — Telephone Encounter (Signed)
Spoke with patient, medications have been ordered

## 2024-01-17 NOTE — Telephone Encounter (Signed)
*  STAT* If patient is at the pharmacy, call can be transferred to refill team.   1. Which medications need to be refilled? (please list name of each medication and dose if known)   Evolocumab (REPATHA SURECLICK) 140 MG/ML SOAJ   2. Would you like to learn more about the convenience, safety, & potential cost savings by using the [facility]?   3. Are you open to using the [facility] (Type [facility]. ).  4. Which pharmacy/location (including street and city if local pharmacy) is medication to be sent to?  [id number] - [place], [place] - [address]   5. Do they need a 30 day or 90 day supply?   90 day  Patient stated she still has medication left.

## 2024-02-08 ENCOUNTER — Other Ambulatory Visit: Payer: Self-pay | Admitting: Internal Medicine

## 2024-02-11 ENCOUNTER — Other Ambulatory Visit: Payer: Self-pay

## 2024-02-11 ENCOUNTER — Encounter: Payer: Self-pay | Admitting: Internal Medicine

## 2024-02-11 ENCOUNTER — Ambulatory Visit (INDEPENDENT_AMBULATORY_CARE_PROVIDER_SITE_OTHER): Payer: 59 | Admitting: Internal Medicine

## 2024-02-11 VITALS — BP 120/82 | HR 64 | Temp 97.9°F | Ht 62.0 in | Wt 220.8 lb

## 2024-02-11 DIAGNOSIS — Z91018 Allergy to other foods: Secondary | ICD-10-CM | POA: Diagnosis not present

## 2024-02-11 DIAGNOSIS — L501 Idiopathic urticaria: Secondary | ICD-10-CM | POA: Diagnosis not present

## 2024-02-11 MED ORDER — LEVOCETIRIZINE DIHYDROCHLORIDE 5 MG PO TABS: 5.0000 mg | ORAL_TABLET | Freq: Two times a day (BID) | ORAL | 5 refills | Status: DC | PRN

## 2024-02-11 MED ORDER — EPINEPHRINE 0.3 MG/0.3ML IJ SOAJ: 0.3000 mg | INTRAMUSCULAR | 1 refills | Status: DC | PRN

## 2024-02-11 MED ORDER — FAMOTIDINE 20 MG PO TABS: 20.0000 mg | ORAL_TABLET | Freq: Two times a day (BID) | ORAL | 5 refills | Status: DC | PRN

## 2024-02-11 NOTE — Patient Instructions (Addendum)
Chronic Idiopathic Urticaria (Hives): - At this time etiology of hives and swelling is unknown. Hives can be caused by a variety of different triggers including illness/infection, pressure, vibrations, extremes of temperature to name a few however majority of the time there is no identifiable trigger.  -Start Xyzl 5mg  daily.   -If no improvement in a few days, increase to Xyzal 5mg  twice daily.   -If still no improvement in 2-3 days, add Pepcid 20mg  twice daily and continue Xyzal 5mg  twice daily.  Food Allergy:  - please strictly avoid shellfish, tree nuts (except cashews and pistachios which you already eat and tolerate), sesame - for SKIN only reaction, okay to take Benadryl 25mg  capsules every 6 hours as needed - for SKIN + ANY additional symptoms, OR IF concern for LIFE THREATENING reaction = Epipen Autoinjector EpiPen 0.3 mg. - If using Epinephrine autoinjector, call 911 or go to the ER.

## 2024-02-11 NOTE — Progress Notes (Signed)
FOLLOW UP Date of Service/Encounter:  02/11/24   Subjective:  Tina Stone (DOB: 09/21/1950) is a 74 y.o. female who [facility] for follow up for food allergies and CSU.   History obtained from: chart review and patient. Last seen on 02/28/2022 and at the time discussed use of Xyzal.  Avoiding shellfish, some treenuts, sesame.     Since last visit, reports she was doing well until about a week ago, started noting recurrence of itching and hives on her back.  No pictures and none on exam today.  She reports going swimming and hitting her toe and wonders if that is causing it.  Also is trying to figure out foods but can't pinpoint anything; for example, has eaten tortilla chips for years but now wonders if this is causing her hives. She is followed by Oncology for uterine cancer in remission, plans for repeat scan in 05/2024.  Also has hypothyroidism on levothyroxine, most recent TSH 1.44 controlled. She is not taking any anti histamines; used to be on Xyzal.  Avoids shellfish, treenuts, sesame. Has no interested in reintroduction.   Past Medical History: Past Medical History:  Diagnosis Date   Adenomatous colon polyp    Allergic rhinitis, seasonal    Allergy    Atrial fibrillation (HCC)    Beta thalassemia trait 11/25/2013   Cholelithiasis    Class 3 obesity without serious comorbidity with body mass index (BMI) of 40.0 to 44.9 in adult 11/19/2012   DDD (degenerative disc disease), lumbar    endometrial ca dx'd 08/2009   endometrial    GERD (gastroesophageal reflux disease)    H. pylori infection    History of radiation therapy    endometrial - 02/19/2022-03/23/2022 Dr Antony Blackbird   HLD (hyperlipidemia)    Hypercholesterolemia    Hypertension 03/18/2017   no meds    Hypothyroidism    Intraductal papilloma of left breast    Patient underwent left needle-localized lumpectomy by Dr. Wilmon Arms. Tsuei on 09/09/2013; pathology showed  intraductal papilloma with no atypia or malignancy identified.   Morbid (severe) obesity due to excess calories (HCC) 09/27/2023   bmi 40.38   PONV (postoperative nausea and vomiting)    Pre-diabetes    pt denies   Sciatica    Spinal stenosis    Urticaria    Uterine fibroid     Objective:  BP 120/82 (BP Location: Right Arm, Patient Position: Sitting, Cuff Size: Normal)   Pulse 64   Temp 97.9 F (36.6 C) (Temporal)   Ht 5\' 2"  (1.575 m)   Wt 220 lb 12.8 oz (100.2 kg)   SpO2 97%   BMI 40.38 kg/m  Body mass index is 40.38 kg/m. Physical Exam: GEN: alert, well developed HEENT: clear conjunctiva, nose with slight rhinorrhea, no turbinate hypertrophy  HEART: regular rate and rhythm, no murmur LUNGS: clear to auscultation bilaterally, no coughing, unlabored respiration SKIN: no hives on exam, R great toe with slight swelling but no erythema/warmth/foreign material   Assessment:   1. Chronic idiopathic urticaria   2. Food allergy     Plan/Recommendations:  Chronic Idiopathic Urticaria (Hives): - At this time etiology of hives and swelling is unknown. Hives can be caused by a variety of different triggers including illness/infection, pressure, vibrations, extremes of temperature to name a few however majority of the time there is no identifiable trigger.  - Known hypothyroidism, controlled on levothyroxine. Discussed chronic hives are not related to foods; okay  to eat her nacho chips that she has been eating for years.  I also don't think it has to do with her injuring her toe in swimming pool, no signs of infection.  -SPT 2022 positive to grasses, weeds, trees  -Restart Xyzl 5mg  daily.   -If no improvement in a few days, increase to Xyzal 5mg  twice daily.   -If still no improvement in 2-3 days, add Pepcid 20mg  twice daily and continue Xyzal 5mg  twice daily.   Food allergy:  - please strictly avoid shellfish, tree nuts (except cashews and pistachios which you already eat and  tolerate), sesame. Not interested in reintroduction.  - Initial rxn: none with sesame, positive testing only; shellfish causes stomach pain/nausea/vomiting/diarrhea, treenuts like almonds cause stomach pain/vomiting/diarrhea - sIgE 08/2021: positive to walnut, sesame, hazelnut, almond; negative to shrimp/scallops/cashew - for SKIN only reaction, okay to take Benadryl 25mg  capsules every 6 hours as needed - for SKIN + ANY additional symptoms, OR IF concern for LIFE THREATENING reaction = Epipen Autoinjector EpiPen 0.3 mg. - If using Epinephrine autoinjector, call 911 or go to the ER.    Return in about 3 months (around 05/10/2024).  Alesia Morin, MD [facility]

## 2024-03-06 ENCOUNTER — Other Ambulatory Visit: Payer: Self-pay | Admitting: Internal Medicine

## 2024-03-06 DIAGNOSIS — N6310 Unspecified lump in the right breast, unspecified quadrant: Secondary | ICD-10-CM

## 2024-03-13 ENCOUNTER — Ambulatory Visit

## 2024-03-13 ENCOUNTER — Other Ambulatory Visit: Payer: Self-pay | Admitting: Family

## 2024-03-13 ENCOUNTER — Other Ambulatory Visit: Payer: Self-pay | Admitting: Internal Medicine

## 2024-03-13 ENCOUNTER — Ambulatory Visit
Admission: RE | Admit: 2024-03-13 | Discharge: 2024-03-13 | Disposition: A | Discharge status: Home / Self Care | Payer: 59 | Source: Ambulatory Visit | Attending: Internal Medicine | Admitting: Internal Medicine

## 2024-03-13 DIAGNOSIS — I480 Paroxysmal atrial fibrillation: Secondary | ICD-10-CM

## 2024-03-13 DIAGNOSIS — I10 Essential (primary) hypertension: Secondary | ICD-10-CM

## 2024-03-13 DIAGNOSIS — D6869 Other thrombophilia: Secondary | ICD-10-CM

## 2024-03-13 DIAGNOSIS — N6310 Unspecified lump in the right breast, unspecified quadrant: Secondary | ICD-10-CM

## 2024-03-13 DIAGNOSIS — N6311 Unspecified lump in the right breast, upper outer quadrant: Secondary | ICD-10-CM | POA: Diagnosis not present

## 2024-03-16 ENCOUNTER — Other Ambulatory Visit: Payer: Self-pay | Admitting: Internal Medicine

## 2024-03-16 DIAGNOSIS — D6869 Other thrombophilia: Secondary | ICD-10-CM

## 2024-03-16 DIAGNOSIS — I480 Paroxysmal atrial fibrillation: Secondary | ICD-10-CM

## 2024-03-16 MED ORDER — APIXABAN 5 MG PO TABS: 5.0000 mg | ORAL_TABLET | Freq: Two times a day (BID) | ORAL | 2 refills | Status: DC

## 2024-03-16 NOTE — Telephone Encounter (Signed)
 Copied from CRM [id number]. Topic: Clinical - Medication Refill >> Mar 16, 2024 11:34 AM Kathryne Eriksson wrote: Most Recent Primary Care Visit:  Provider: Etta Grandchild  Department: [facility]  Visit Type: OFFICE VISIT  Date: 01/09/2024  Medication: apixaban (ELIQUIS) 5 MG TABS tablet  Has the patient contacted their pharmacy? Yes (Agent: If no, request that the patient contact the pharmacy for the refill. If patient does not wish to contact the pharmacy document the reason why and proceed with request.) (Agent: If yes, when and what did the pharmacy advise?)  Is this the correct pharmacy for this prescription? Yes If no, delete pharmacy and type the correct one.  This is the patient's preferred pharmacy:  [facility] [address], [place] - [address] [place] [address] Phone: [phone] Fax: [phone]    Has the prescription been filled recently? No  Is the patient out of the medication? Yes  Has the patient been seen for an appointment in the last year OR does the patient have an upcoming appointment? Yes  Can we respond through MyChart? Yes  Agent: Please be advised that Rx refills may take up to 3 business days. We ask that you follow-up with your pharmacy.

## 2024-03-30 DIAGNOSIS — H40013 Open angle with borderline findings, low risk, bilateral: Secondary | ICD-10-CM | POA: Diagnosis not present

## 2024-04-13 ENCOUNTER — Other Ambulatory Visit: Payer: Self-pay | Admitting: Internal Medicine

## 2024-04-13 ENCOUNTER — Other Ambulatory Visit: Payer: Self-pay | Admitting: Family

## 2024-04-13 DIAGNOSIS — I10 Essential (primary) hypertension: Secondary | ICD-10-CM

## 2024-04-13 DIAGNOSIS — I480 Paroxysmal atrial fibrillation: Secondary | ICD-10-CM

## 2024-04-27 ENCOUNTER — Telehealth: Payer: Self-pay

## 2024-04-27 NOTE — Telephone Encounter (Signed)
 Called and left a message that Dr. Marton Sleeper sent a scheduling message to schedule labs on 6/20 and see her on 6/27. Left phone # for radiology scheduling to schedule CT on 6/20. Ask her to call the office for questions.

## 2024-05-06 ENCOUNTER — Ambulatory Visit (INDEPENDENT_AMBULATORY_CARE_PROVIDER_SITE_OTHER): Admitting: Family Medicine

## 2024-05-06 ENCOUNTER — Ambulatory Visit (INDEPENDENT_AMBULATORY_CARE_PROVIDER_SITE_OTHER)
Admission: RE | Admit: 2024-05-06 | Discharge: 2024-05-06 | Disposition: A | Discharge status: Home / Self Care | Source: Ambulatory Visit | Attending: Family Medicine | Admitting: Family Medicine

## 2024-05-06 ENCOUNTER — Encounter: Payer: Self-pay | Admitting: Family Medicine

## 2024-05-06 VITALS — BP 144/82 | HR 64 | Temp 98.4°F | Ht 62.0 in | Wt 225.8 lb

## 2024-05-06 DIAGNOSIS — Z8543 Personal history of malignant neoplasm of ovary: Secondary | ICD-10-CM

## 2024-05-06 DIAGNOSIS — M545 Low back pain, unspecified: Secondary | ICD-10-CM

## 2024-05-06 DIAGNOSIS — M47816 Spondylosis without myelopathy or radiculopathy, lumbar region: Secondary | ICD-10-CM | POA: Diagnosis not present

## 2024-05-06 DIAGNOSIS — M5136 Other intervertebral disc degeneration, lumbar region with discogenic back pain only: Secondary | ICD-10-CM | POA: Diagnosis not present

## 2024-05-06 NOTE — Progress Notes (Signed)
 Patient ID: Tina Stone, female    DOB: 1950/02/03, 74 y.o.   MRN: [id number]  This visit was conducted in person.  BP (!) 144/82   Pulse 64   Temp 98.4 F (36.9 C) (Oral)   Ht 5\' 2"  (1.575 m)   Wt 225 lb 12.8 oz (102.4 kg)   SpO2 96%   BMI 41.30 kg/m    CC:  Chief Complaint  Patient presents with   Back Strain    About a week ago she was helping someone carry heavy trash bags and that's how she strained her back. Its gotten somewhat better but still hurts     Subjective:   HPI: Tina Stone is a 74 y.o. female presenting on 05/06/2024 for Back Strain (About a week ago she was helping someone carry heavy trash bags and that's how she strained her back. Its gotten somewhat better but still hurts )   1 week ago carrying heavy bags.. felt  gradual pain the next day.  2-3  on pain scale  Right mid back radiates to right lower back.  She has been doing stretching and water  aerobic.  No numbness  or weakness in legs.  No radiation to leg.    Not using med to treat.  She is concerned there may be a fracture   Uterine cancer history .   No back surgery.   DEXA nml in past.   Relevant past medical, surgical, family and social history reviewed and updated as indicated. Interim medical history since our last visit reviewed. Allergies and medications reviewed and updated. Outpatient Medications Prior to Visit  Medication Sig Dispense Refill   acetaminophen  (TYLENOL ) 500 MG tablet Take 1,000 mg by mouth every 6 (six) hours as needed for moderate pain.     apixaban  (ELIQUIS ) 5 MG TABS tablet Take 1 tablet (5 mg total) by mouth 2 (two) times daily. 60 tablet 2   APPLE CIDER VINEGAR PO Take 5 mLs by mouth as needed.     atenolol  (TENORMIN ) 25 MG tablet Take 1 tablet (25 mg total) by mouth every evening. 30 tablet 2   Cholecalciferol (VITAMIN D3) 50 MCG (2000 UT) TABS Take by mouth.     EPINEPHrine  (EPIPEN  2-PAK) 0.3 mg/0.3 mL IJ SOAJ injection Inject 0.3 mg into the  muscle as needed for anaphylaxis. 2 each 1   Evolocumab  (REPATHA  SURECLICK) 140 MG/ML SOAJ Inject 140 mg into the skin every 14 (fourteen) days. 2 mL 11   famotidine  (PEPCID ) 20 MG tablet Take 1 tablet (20 mg total) by mouth 2 (two) times daily as needed (hives). 60 tablet 5   indapamide  (LOZOL ) 1.25 MG tablet Take 1 tablet (1.25 mg total) by mouth daily. 90 tablet 0   levocetirizine (XYZAL ) 5 MG tablet Take 1 tablet (5 mg total) by mouth 2 (two) times daily as needed (hives). 30 tablet 5   levothyroxine  (SYNTHROID ) 75 MCG tablet TAKE 1 TABLET BY MOUTH EVERY DAY IN THE MORNING 90 tablet 1   Multiple Vitamin (MULTIVITAMIN) capsule Take 1 capsule by mouth daily.     NON FORMULARY Herbal Ginger and lemon tea     zinc gluconate 50 MG tablet Take 50 mg by mouth daily.     No facility-administered medications prior to visit.     Per HPI unless specifically indicated in ROS section below Review of Systems  Constitutional:  Negative for fatigue and fever.  HENT:  Negative for congestion.   Eyes:  Negative for  pain.  Respiratory:  Negative for cough and shortness of breath.   Cardiovascular:  Negative for chest pain, palpitations and leg swelling.  Gastrointestinal:  Negative for abdominal pain.  Genitourinary:  Negative for dysuria and vaginal bleeding.  Musculoskeletal:  Positive for back pain.  Neurological:  Negative for syncope, light-headedness and headaches.  Psychiatric/Behavioral:  Negative for dysphoric mood.    Objective:  BP (!) 144/82   Pulse 64   Temp 98.4 F (36.9 C) (Oral)   Ht 5\' 2"  (1.575 m)   Wt 225 lb 12.8 oz (102.4 kg)   SpO2 96%   BMI 41.30 kg/m   Wt Readings from Last 3 Encounters:  05/06/24 225 lb 12.8 oz (102.4 kg)  02/11/24 220 lb 12.8 oz (100.2 kg)  01/09/24 221 lb (100.2 kg)      Physical Exam Constitutional:      General: She is not in acute distress.    Appearance: Normal appearance. She is well-developed. She is not ill-appearing or toxic-appearing.   HENT:     Head: Normocephalic.     Right Ear: Hearing, tympanic membrane, ear canal and external ear normal. Tympanic membrane is not erythematous, retracted or bulging.     Left Ear: Hearing, tympanic membrane, ear canal and external ear normal. Tympanic membrane is not erythematous, retracted or bulging.     Nose: No mucosal edema or rhinorrhea.     Right Sinus: No maxillary sinus tenderness or frontal sinus tenderness.     Left Sinus: No maxillary sinus tenderness or frontal sinus tenderness.     Mouth/Throat:     Pharynx: Uvula midline.  Eyes:     General: Lids are normal. Lids are everted, no foreign bodies appreciated.     Conjunctiva/sclera: Conjunctivae normal.     Pupils: Pupils are equal, round, and reactive to light.  Neck:     Thyroid : No thyroid  mass or thyromegaly.     Vascular: No carotid bruit.     Trachea: Trachea normal.  Cardiovascular:     Rate and Rhythm: Normal rate and regular rhythm.     Pulses: Normal pulses.     Heart sounds: Normal heart sounds, S1 normal and S2 normal. No murmur heard.    No friction rub. No gallop.  Pulmonary:     Effort: Pulmonary effort is normal. No tachypnea or respiratory distress.     Breath sounds: Normal breath sounds. No decreased breath sounds, wheezing, rhonchi or rales.  Abdominal:     General: Bowel sounds are normal.     Palpations: Abdomen is soft.     Tenderness: There is no abdominal tenderness.  Musculoskeletal:     Cervical back: Normal range of motion and neck supple.     Lumbar back: Tenderness present. No bony tenderness. Decreased range of motion. Negative right straight leg raise test and negative left straight leg raise test.  Skin:    General: Skin is warm and dry.     Findings: No rash.  Neurological:     Mental Status: She is alert.  Psychiatric:        Mood and Affect: Mood is not anxious or depressed.        Speech: Speech normal.        Behavior: Behavior normal. Behavior is cooperative.         Thought Content: Thought content normal.        Judgment: Judgment normal.       Results for orders placed or performed in visit on  01/03/24  POC COVID-19 [employer]   Collection Time: 01/03/24  4:41 PM  Result Value Ref Range   SARS Coronavirus 2 Ag Negative Negative    Assessment and Plan  Acute right-sided low back pain without sciatica Assessment & Plan: Acute, most likely paraspinous muscle strain.  Given history of uterine cancer she is concerned about a pathologic fracture.  There is no focal vertebral tenderness but we will proceed given her history. Bone density in the past has been normal.  Recommend gentle stretching heat and Tylenol  as needed for pain.  Orders: -     DG Lumbar Spine Complete; Future  History of ovarian cancer -     DG Lumbar Spine Complete; Future    No follow-ups on file.   Herby Lolling, MD

## 2024-05-06 NOTE — Assessment & Plan Note (Signed)
 Acute, most likely paraspinous muscle strain.  Given history of uterine cancer she is concerned about a pathologic fracture.  There is no focal vertebral tenderness but we will proceed given her history. Bone density in the past has been normal.  Recommend gentle stretching heat and Tylenol  as needed for pain.

## 2024-05-07 ENCOUNTER — Encounter: Payer: Self-pay | Admitting: Family Medicine

## 2024-05-08 ENCOUNTER — Ambulatory Visit: Payer: 59 | Admitting: Internal Medicine

## 2024-05-08 ENCOUNTER — Other Ambulatory Visit: Payer: Self-pay | Admitting: Internal Medicine

## 2024-05-08 ENCOUNTER — Other Ambulatory Visit: Payer: Self-pay | Admitting: Family

## 2024-05-08 DIAGNOSIS — I10 Essential (primary) hypertension: Secondary | ICD-10-CM

## 2024-05-08 DIAGNOSIS — I480 Paroxysmal atrial fibrillation: Secondary | ICD-10-CM

## 2024-05-11 ENCOUNTER — Other Ambulatory Visit: Payer: Self-pay | Admitting: Internal Medicine

## 2024-05-11 ENCOUNTER — Ambulatory Visit: Admitting: Internal Medicine

## 2024-05-15 ENCOUNTER — Ambulatory Visit: Payer: 59 | Attending: Internal Medicine | Admitting: Internal Medicine

## 2024-05-15 ENCOUNTER — Encounter: Payer: Self-pay | Admitting: Internal Medicine

## 2024-05-15 VITALS — BP 130/78 | HR 56 | Ht 62.0 in | Wt 222.8 lb

## 2024-05-15 DIAGNOSIS — I480 Paroxysmal atrial fibrillation: Secondary | ICD-10-CM | POA: Diagnosis not present

## 2024-05-15 DIAGNOSIS — E785 Hyperlipidemia, unspecified: Secondary | ICD-10-CM

## 2024-05-15 DIAGNOSIS — I10 Essential (primary) hypertension: Secondary | ICD-10-CM | POA: Diagnosis not present

## 2024-05-15 DIAGNOSIS — E66813 Obesity, class 3: Secondary | ICD-10-CM

## 2024-05-15 NOTE — Patient Instructions (Signed)
 Medication Instructions:  Your physician recommends that you continue on your current medications as directed. Please refer to the Current Medication list given to you today.  *If you need a refill on your cardiac medications before your next appointment, please call your pharmacy*  Lab Work: Fasting lipids - NMR in 1 year (May 2026) If you have labs (blood work) drawn today and your tests are completely normal, you will receive your results only by: MyChart Message (if you have MyChart) OR A paper copy in the mail If you have any lab test that is abnormal or we need to change your treatment, we will call you to review the results.  Follow-Up: At [facility], you and your health needs are our priority.  As part of our continuing mission to provide you with exceptional heart care, our providers are all part of one team.  This team includes your primary Cardiologist (physician) and Advanced Practice Providers or APPs (Physician Assistants and Nurse Practitioners) who all work together to provide you with the care you need, when you need it.  Your next appointment:   In 1 year with Dr. Maximo Spar   Other Instructions Referral to PharmD for weight loss

## 2024-05-15 NOTE — Progress Notes (Signed)
 LIPID CLINIC CONSULT NOTE  Chief Complaint:  Follow-up dyslipidemia  Primary Care Physician: Arcadio Knuckles, MD  Primary Cardiologist:  Hazle Lites, MD  HPI:  Tina Stone is a 74 y.o. female who is being seen today for the evaluation of dyslipidemia at the request of Arcadio Knuckles, MD. this is a pleasant 74 year old female kindly referred for evaluation management of dyslipidemia.  She has a history of atrial fibrillation, hypertension and dyslipidemia with aortic atherosclerosis.  Unfortunately she has been intolerant to statins.  Most recently she was tried on rosuvastatin  but has previously had muscle aches on atorvastatin  and pravastatin .  She then was seen by our lipid clinic pharmacist who started her on Repatha  back in August.  She believes she has had at least 3 injections prior to repeat lab work in October which showed total cholesterol 219, HDL 55, triglycerides 84 and LDL 125.  The LDL had come down from 167, but this would be considered a suboptimal response, much less than the expected 50 to 65%.  There may be several reasons for this.  A change in diet or decrease in activity that was concomitant with starting the medicine might explain it.  Ms. administration of the medication could be an issue but it does not sound like she is having issues with that.  The other possibilities are mutations a PCSK9 or perhaps high LP(a) which seems to be a more common issue.  She also asked about Ozempic today and was interested in a prescription for it.  03/18/2023  Tina Stone returns today for follow-up of her lipids.  She has had further improvement in her cholesterol on Repatha .  Her LDL particle number is now 1118 with an LDL-C of 106, triglycerides 82 and HDL of 65.  Her LP(a) was assessed and is elevated 167 nmol/L.  She seems to be tolerating the Repatha  quite well.  No side effects as noted with her statins in the past.  Her primary cardiologist Dr. Felipe Horton is retired.  I am  happy to follow-up with her regarding her lipids and A-fib is a general cardiologist.  05/15/2024  Tina Stone is seen today in follow-up.  She was seen last August by Kathyrn Lawrence, NP.  At the time they discussed weight loss and there was a plan to refer her to healthy weight and wellness however that never occurred.  She is interested in GLP-1 medications however does not have diabetes and may have difficulty getting insurance coverage for that.  She does have hypertension and obesity with a BMI over 40 as well as aortic atherosclerosis and atrial fibrillation.  Perhaps she might qualify for [facility].  She denies any symptomatic atrial fibrillation.  EKG shows sinus bradycardia today.  Her cholesterol remains above target with LDL 108.  Goal is less than 70.  She remains on Repatha  which she is tolerating well.  PMHx:  Past Medical History:  Diagnosis Date   Adenomatous colon polyp    Allergic rhinitis, seasonal    Allergy    Atrial fibrillation (HCC)    Beta thalassemia trait 11/25/2013   Cholelithiasis    Class 3 obesity without serious comorbidity with body mass index (BMI) of 40.0 to 44.9 in adult 11/19/2012   DDD (degenerative disc disease), lumbar    endometrial ca dx'd 08/2009   endometrial    GERD (gastroesophageal reflux disease)    H. pylori infection    History of radiation therapy    endometrial - 02/19/2022-03/23/2022 Dr  Retta Caster   HLD (hyperlipidemia)    Hypercholesterolemia    Hypertension 03/18/2017   no meds    Hypothyroidism    Intraductal papilloma of left breast    Patient underwent left needle-localized lumpectomy by Dr. Kari Otto. Tsuei on 09/09/2013; pathology showed intraductal papilloma with no atypia or malignancy identified.   Morbid (severe) obesity due to excess calories (HCC) 09/27/2023   bmi 40.38   PONV (postoperative nausea and vomiting)    Pre-diabetes    pt denies   Sciatica    Spinal stenosis    Urticaria    Uterine fibroid     Past  Surgical History:  Procedure Laterality Date   ANKLE FRACTURE SURGERY Right 1966   right   BREAST EXCISIONAL BIOPSY Left 2014   benign   BREAST LUMPECTOMY WITH NEEDLE LOCALIZATION Left 09/09/2013   Procedure: BREAST LUMPECTOMY WITH NEEDLE LOCALIZATION;  Surgeon: Kari Otto. Eli Grizzle, MD;  Location: [facility] OR;  Service: General;  Laterality: Left;   CHOLECYSTECTOMY     COLONOSCOPY     IR IMAGING GUIDED PORT INSERTION  05/18/2020   IR REMOVAL TUN ACCESS W/ PORT W/O FL MOD SED  01/02/2023   POLYPECTOMY     ROBOTIC ASSISTED LAPAROSCOPIC CHOLECYSTECTOMY  09/09/2019   ROBOTIC ASSISTED TOTAL HYSTERECTOMY WITH BILATERAL SALPINGO OOPHERECTOMY N/A 10/22/2019   Procedure: XI ROBOTIC ASSISTED TOTAL HYSTERECTOMY WITH BILATERAL SALPINGO OOPHORECTOMY GREATER THAN 250 GRAMS, MINI LAPARTOMY FOR SPECIMEN DELIVERY; PELVIC AND PERI-AORTIC LYMPHADENECTOMY;  Surgeon: Alphonso Aschoff, MD;  Location: [facility];  Service: Gynecology;  Laterality: N/A;   SENTINEL NODE BIOPSY N/A 10/22/2019   Procedure: SENTINEL NODE BIOPSY;  Surgeon: Alphonso Aschoff, MD;  Location: [facility];  Service: Gynecology;  Laterality: N/A;    FAMHx:  Family History  Problem Relation Age of Onset   Diabetes Mother    Hypertension Mother    Colon polyps Mother 19   Dementia Mother 49   Diabetes Father    Congestive Heart Failure Father    Asthma Brother    Diabetes Brother    Hypertension Brother    Hypertension Brother    Hypertension Maternal Grandmother    Diabetes Maternal Grandmother    Asthma Paternal Grandmother    Lung disease Paternal Grandmother    Diabetes Paternal Grandfather    Pancreatic cancer Paternal Aunt    Uterine cancer Paternal Aunt    Colon cancer Neg Hx    Breast cancer Neg Hx    Lung cancer Neg Hx    Esophageal cancer Neg Hx    Rectal cancer Neg Hx    Stomach cancer Neg Hx     SOCHx:   reports that she has never smoked. She has never used smokeless tobacco. She reports that she does not drink alcohol and does not use  drugs.  ALLERGIES:  Allergies  Allergen Reactions   Benicar  Hct [Olmesartan  Medoxomil-Hctz] Shortness Of Breath and Palpitations   Iodinated Contrast Media Hives and Itching    02/02/2021-  pt developed 2 hives and itching even with the 13hr premedication.  Radiology PA came into eval the pt.  Developed itching and hives after injection on 05/10/20; needs 13hr prep in future   Crestor  [Rosuvastatin ] Other (See Comments)    Muscle aches   Bactrim  [Sulfamethoxazole -Trimethoprim ] Other (See Comments)    Abdominal pain, dizziness   Pravachol  [Pravastatin ] Other (See Comments)    Lower abdominal pain   Shellfish Allergy Nausea And Vomiting   Amlodipine  Palpitations    ROS: Pertinent items noted  in HPI and remainder of comprehensive ROS otherwise negative.  HOME MEDS: Current Outpatient Medications on File Prior to Visit  Medication Sig Dispense Refill   acetaminophen  (TYLENOL ) 500 MG tablet Take 1,000 mg by mouth every 6 (six) hours as needed for moderate pain.     apixaban  (ELIQUIS ) 5 MG TABS tablet Take 1 tablet (5 mg total) by mouth 2 (two) times daily. 60 tablet 2   APPLE CIDER VINEGAR PO Take 5 mLs by mouth as needed.     atenolol  (TENORMIN ) 25 MG tablet Take 1 tablet (25 mg total) by mouth every evening. 30 tablet 2   Cholecalciferol (VITAMIN D3) 50 MCG (2000 UT) TABS Take by mouth.     EPINEPHrine  (EPIPEN  2-PAK) 0.3 mg/0.3 mL IJ SOAJ injection Inject 0.3 mg into the muscle as needed for anaphylaxis. 2 each 1   Evolocumab  (REPATHA  SURECLICK) 140 MG/ML SOAJ Inject 140 mg into the skin every 14 (fourteen) days. 2 mL 11   famotidine  (PEPCID ) 20 MG tablet TAKE 1 TABLET (20 MG TOTAL) BY MOUTH 2 (TWO) TIMES DAILY AS NEEDED (HIVES). 180 tablet 1   indapamide  (LOZOL ) 1.25 MG tablet TAKE 1 TABLET BY MOUTH DAILY. 90 tablet 0   levocetirizine (XYZAL ) 5 MG tablet Take 1 tablet (5 mg total) by mouth 2 (two) times daily as needed (hives). 30 tablet 5   levothyroxine  (SYNTHROID ) 75 MCG tablet TAKE  1 TABLET BY MOUTH EVERY DAY IN THE MORNING 90 tablet 1   Multiple Vitamin (MULTIVITAMIN) capsule Take 1 capsule by mouth daily.     NON FORMULARY Herbal Ginger and lemon tea     zinc gluconate 50 MG tablet Take 50 mg by mouth daily.     No current facility-administered medications on file prior to visit.    LABS/IMAGING: No results found for this or any previous visit (from the past 48 hours).  No results found.  LIPID PANEL:    Component Value Date/Time   CHOL 201 (H) 08/16/2023 1159   TRIG 53 08/16/2023 1159   HDL 79 08/16/2023 1159   CHOLHDL 2.5 08/16/2023 1159   CHOLHDL 4 07/09/2022 0845   VLDL 12.6 07/09/2022 0845   LDLCALC 112 (H) 08/16/2023 1159    WEIGHTS: Wt Readings from Last 3 Encounters:  05/15/24 222 lb 12.8 oz (101.1 kg)  05/06/24 225 lb 12.8 oz (102.4 kg)  02/11/24 220 lb 12.8 oz (100.2 kg)    VITALS: BP 130/78 (BP Location: Left Arm, Patient Position: Sitting, Cuff Size: Large)   Pulse (!) 56   Ht 5\' 2"  (1.575 m)   Wt 222 lb 12.8 oz (101.1 kg)   SpO2 99%   BMI 40.75 kg/m   EXAM: Deferred  EKG: EKG Interpretation Date/Time:  Friday May 15 2024 07:55:35 EDT Ventricular Rate:  56 PR Interval:  190 QRS Duration:  70 QT Interval:  408 QTC Calculation: 393 R Axis:   2  Text Interpretation: Sinus bradycardia When compared with ECG of 13-Aug-2023 14:28, Criteria for Septal infarct are no longer Present Confirmed by Dinah Franco [phone]) on 05/15/2024 8:11:24 AM    ASSESSMENT: Mixed dyslipidemia, goal LDL less than 70 Statin intolerant-myalgias Aortic atherosclerosis Hypertension PAF Morbid obesity, BMI>40  PLAN: 1.   Tina Stone is doing well without any recurrent symptomatic A-fib on Eliquis .  No bleeding issues.  She is statin intolerant but has been on Repatha  but her LDL remains in the low 100s.  Her target LDL is less than 70 given history of aortic atherosclerosis.  We discussed additional therapies today.  She like to focus on weight loss  as an end to that.  There was discussion about this before however she was never referred to clinic.  She is interested in GLP-1 agonist and may be a candidate for those.  I will refer to our Pharm.D.'s to see if she might qualify based on her weight, hypertension, A-fib and history of atherosclerosis.  Plan follow-up with repeat lipids in 1 year or sooner as necessary.  Hazle Lites, MD, [facility], FNLA, FACP  [place]  [facility]  Medical Director of the [facility] Lipid [facility] &  Cardiovascular Risk Reduction Clinic Diplomate of the American Board of Clinical Lipidology Attending Cardiologist  Direct Dial: [phone]  Fax: [phone]  Website:  [email or web address] 05/15/2024, 8:09 AM

## 2024-05-20 ENCOUNTER — Ambulatory Visit (INDEPENDENT_AMBULATORY_CARE_PROVIDER_SITE_OTHER)

## 2024-05-20 VITALS — Ht 62.0 in | Wt 222.0 lb

## 2024-05-20 DIAGNOSIS — Z0000 Encounter for general adult medical examination without abnormal findings: Secondary | ICD-10-CM

## 2024-05-20 NOTE — Progress Notes (Signed)
 Subjective:    Subjective:  Please attest and cosign this visit due to patients primary care provider not being in the office at the time the visit was completed.  (Pt of Dr Sandra Crouch)   Tina Stone is a 74 y.o. who presents for a Medicare Wellness preventive visit.  As a reminder, Annual Wellness Visits don't include a physical exam, and some assessments may be limited, especially if this visit is performed virtually. We may recommend an in-person follow-up visit with your provider if needed.  Visit Complete: Virtual I connected with  Briannon Boggio Behney on 05/20/24 by a audio enabled telemedicine application and verified that I am speaking with the correct person using two identifiers.  Patient Location: Home  Provider Location: Office/Clinic  I discussed the limitations of evaluation and management by telemedicine. The patient expressed understanding and agreed to proceed.  Vital Signs: Because this visit was a virtual/telehealth visit, some criteria may be missing or patient reported. Any vitals not documented were not able to be obtained and vitals that have been documented are patient reported.  VideoDeclined- This patient declined Librarian, academic. Therefore the visit was completed with audio only.  Persons Participating in Visit: Patient.  AWV Questionnaire: Yes: Patient Medicare AWV questionnaire was completed by the patient on 05/19/2024; I have confirmed that all information answered by patient is correct and no changes since this date.  Cardiac Risk Factors include: advanced age (>74men, >39 women);dyslipidemia;hypertension;obesity (BMI >30kg/m2)     Objective:     Today's Vitals   05/20/24 1135  Weight: 222 lb (100.7 kg)  Height: 5\' 2"  (1.575 m)   Body mass index is 40.6 kg/m.     05/20/2024   11:34 AM 09/06/2023   10:27 AM 05/20/2023    8:57 AM 11/30/2022    8:49 AM 09/07/2022    9:21 AM 05/15/2022    3:29 PM 05/03/2022     8:27 AM  Advanced Directives  Does Patient Have a Medical Advance Directive? No No No No No No No  Would patient like information on creating a medical advance directive? Yes (MAU/Ambulatory/Procedural Areas - Information given) No - Patient declined No - Patient declined   No - Patient declined No - Patient declined    Current Medications (verified) Outpatient Encounter Medications as of 05/20/2024  Medication Sig   acetaminophen  (TYLENOL ) 500 MG tablet Take 1,000 mg by mouth every 6 (six) hours as needed for moderate pain.   apixaban  (ELIQUIS ) 5 MG TABS tablet Take 1 tablet (5 mg total) by mouth 2 (two) times daily.   APPLE CIDER VINEGAR PO Take 5 mLs by mouth as needed.   atenolol  (TENORMIN ) 25 MG tablet Take 1 tablet (25 mg total) by mouth every evening.   Cholecalciferol (VITAMIN D3) 50 MCG (2000 UT) TABS Take by mouth.   EPINEPHrine  (EPIPEN  2-PAK) 0.3 mg/0.3 mL IJ SOAJ injection Inject 0.3 mg into the muscle as needed for anaphylaxis.   Evolocumab  (REPATHA  SURECLICK) 140 MG/ML SOAJ Inject 140 mg into the skin every 14 (fourteen) days.   famotidine  (PEPCID ) 20 MG tablet TAKE 1 TABLET (20 MG TOTAL) BY MOUTH 2 (TWO) TIMES DAILY AS NEEDED (HIVES).   indapamide  (LOZOL ) 1.25 MG tablet TAKE 1 TABLET BY MOUTH DAILY.   levocetirizine (XYZAL ) 5 MG tablet Take 1 tablet (5 mg total) by mouth 2 (two) times daily as needed (hives).   levothyroxine  (SYNTHROID ) 75 MCG tablet TAKE 1 TABLET BY MOUTH EVERY DAY IN THE MORNING  Multiple Vitamin (MULTIVITAMIN) capsule Take 1 capsule by mouth daily.   NON FORMULARY Herbal Ginger and lemon tea   zinc gluconate 50 MG tablet Take 50 mg by mouth daily.   No facility-administered encounter medications on file as of 05/20/2024.    Allergies (verified) Benicar  hct [olmesartan  medoxomil-hctz], Iodinated contrast media, Crestor  [rosuvastatin ], Bactrim  [sulfamethoxazole -trimethoprim ], Pravachol  [pravastatin ], Shellfish allergy, and Amlodipine    History: Past  Medical History:  Diagnosis Date   Adenomatous colon polyp    Allergic rhinitis, seasonal    Allergy    Atrial fibrillation (HCC)    Beta thalassemia trait 11/25/2013   Cholelithiasis    Class 3 obesity without serious comorbidity with body mass index (BMI) of 40.0 to 44.9 in adult 11/19/2012   DDD (degenerative disc disease), lumbar    endometrial ca dx'd 08/2009   endometrial    GERD (gastroesophageal reflux disease)    H. pylori infection    History of radiation therapy    endometrial - 02/19/2022-03/23/2022 Dr Retta Caster   HLD (hyperlipidemia)    Hypercholesterolemia    Hypertension 03/18/2017   no meds    Hypothyroidism    Intraductal papilloma of left breast    Patient underwent left needle-localized lumpectomy by Dr. Kari Otto. Tsuei on 09/09/2013; pathology showed intraductal papilloma with no atypia or malignancy identified.   Morbid (severe) obesity due to excess calories (HCC) 09/27/2023   bmi 40.38   PONV (postoperative nausea and vomiting)    Pre-diabetes    pt denies   Sciatica    Spinal stenosis    Urticaria    Uterine fibroid    Past Surgical History:  Procedure Laterality Date   ANKLE FRACTURE SURGERY Right 1966   right   BREAST EXCISIONAL BIOPSY Left 2014   benign   BREAST LUMPECTOMY WITH NEEDLE LOCALIZATION Left 09/09/2013   Procedure: BREAST LUMPECTOMY WITH NEEDLE LOCALIZATION;  Surgeon: Kari Otto. Eli Grizzle, MD;  Location: [facility] OR;  Service: General;  Laterality: Left;   CHOLECYSTECTOMY     COLONOSCOPY     IR IMAGING GUIDED PORT INSERTION  05/18/2020   IR REMOVAL TUN ACCESS W/ PORT W/O FL MOD SED  01/02/2023   POLYPECTOMY     ROBOTIC ASSISTED LAPAROSCOPIC CHOLECYSTECTOMY  09/09/2019   ROBOTIC ASSISTED TOTAL HYSTERECTOMY WITH BILATERAL SALPINGO OOPHERECTOMY N/A 10/22/2019   Procedure: XI ROBOTIC ASSISTED TOTAL HYSTERECTOMY WITH BILATERAL SALPINGO OOPHORECTOMY GREATER THAN 250 GRAMS, MINI LAPARTOMY FOR SPECIMEN DELIVERY; PELVIC AND PERI-AORTIC LYMPHADENECTOMY;   Surgeon: Alphonso Aschoff, MD;  Location: [facility];  Service: Gynecology;  Laterality: N/A;   SENTINEL NODE BIOPSY N/A 10/22/2019   Procedure: SENTINEL NODE BIOPSY;  Surgeon: Alphonso Aschoff, MD;  Location: [facility];  Service: Gynecology;  Laterality: N/A;   Family History  Problem Relation Age of Onset   Diabetes Mother    Hypertension Mother    Colon polyps Mother 79   Dementia Mother 75   Diabetes Father    Congestive Heart Failure Father    Asthma Brother    Diabetes Brother    Hypertension Brother    Hypertension Brother    Hypertension Maternal Grandmother    Diabetes Maternal Grandmother    Asthma Paternal Grandmother    Lung disease Paternal Grandmother    Diabetes Paternal Grandfather    Pancreatic cancer Paternal Aunt    Uterine cancer Paternal Aunt    Colon cancer Neg Hx    Breast cancer Neg Hx    Lung cancer Neg Hx    Esophageal cancer Neg Hx  Rectal cancer Neg Hx    Stomach cancer Neg Hx    Social History   Socioeconomic History   Marital status: Widowed    Spouse name: Not on file   Number of children: 0   Years of education: Not on file   Highest education level: Bachelor's degree (e.g., BA, AB, BS)  Occupational History   Occupation: Geographical information systems officer  Tobacco Use   Smoking status: Never   Smokeless tobacco: Never   Tobacco comments:    few puffs but not a true smoker quit many yrs ago  Vaping Use   Vaping status: Never Used  Substance and Sexual Activity   Alcohol use: Never   Drug use: Never   Sexual activity: Not Currently  Other Topics Concern   Not on file  Social History Narrative   ** Merged History Encounter **       Lives in [place], widowed 2003   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         Social Drivers of Health   Financial Resource Strain: Low Risk  (05/20/2024)   Overall Financial Resource Strain (CARDIA)    Difficulty of Paying Living Expenses: Not very hard  Food Insecurity: Food Insecurity Present (05/20/2024)   Hunger Vital  Sign    Worried About Running Out of Food in the Last Year: Never true    Ran Out of Food in the Last Year: Sometimes true  Transportation Needs: No Transportation Needs (05/20/2024)   PRAPARE - Administrator, Civil Service (Medical): No    Lack of Transportation (Non-Medical): No  Physical Activity: Sufficiently Active (05/20/2024)   Exercise Vital Sign    Days of Exercise per Week: 3 days    Minutes of Exercise per Session: 90 min  Stress: No Stress Concern Present (05/20/2024)   [employer] of Occupational Health - Occupational Stress Questionnaire    Feeling of Stress : Not at all  Social Connections: Moderately Integrated (05/20/2024)   Social Connection and Isolation Panel [NHANES]    Frequency of Communication with Friends and Family: More than three times a week    Frequency of Social Gatherings with Friends and Family: More than three times a week    Attends Religious Services: More than 4 times per year    Active Member of [employer] or Organizations: Yes    Attends Banker Meetings: More than 4 times per year    Marital Status: Widowed    Tobacco Counseling Counseling given: No Tobacco comments: few puffs but not a true smoker quit many yrs ago    Clinical Intake:  Pre-visit preparation completed: Yes  Pain : No/denies pain     BMI - recorded: 40.6 Nutritional Status: BMI > 30  Obese Nutritional Risks: None Diabetes: No  Lab Results  Component Value Date   HGBA1C 5.3 02/02/2022   HGBA1C 4.8 11/14/2021   HGBA1C 5.8 08/17/2021     How often do you need to have someone help you when you read instructions, pamphlets, or other written materials from your doctor or pharmacy?: 1 - Never  Interpreter Needed?: No  Information entered by :: Kandy Orris, CMA   Activities of Daily Living     05/20/2024   11:37 AM 05/19/2024   10:47 AM  In your present state of health, do you have any difficulty performing the following activities:   Hearing? 0 0  Vision? 0 0  Difficulty concentrating or making decisions? 0 0  Walking or  climbing stairs? 0 0  Dressing or bathing? 0 0  Doing errands, shopping? 0 0  Preparing Food and eating ? N N  Using the Toilet? N N  In the past six months, have you accidently leaked urine? N N  Do you have problems with loss of bowel control? N N  Managing your Medications? N N  Managing your Finances? N N  Housekeeping or managing your Housekeeping? N N    Patient Care Team: Arcadio Knuckles, MD as PCP - General (Internal Medicine) Maximo Spar Aviva Lemmings, MD as PCP - Cardiology (Cardiology) Ether Hercules, MD Florance Hun, MD as Consulting Physician (Ophthalmology) Dion Frankel, [facility] (Pharmacist) Florance Hun, MD as Consulting Physician (Ophthalmology)  Indicate any recent Medical Services you may have received from other than [facility] providers in the past year (date may be approximate).     Assessment:    This is a routine wellness examination for Tina Stone.  Hearing/Vision screen Hearing Screening - Comments:: Denies hearing difficulties   Vision Screening - Comments:: Wears rx glasses - up to date with routine eye exams with Dr Carloyn Chi w/[facility]   Goals Addressed               This Visit's Progress     Patient Stated (pt-stated)        Patient stated she plans to continue losing weight and exercise       Depression Screen     05/20/2024   11:38 AM 05/06/2024   12:06 PM 12/04/2023    8:29 AM 08/19/2023   10:16 AM 08/09/2023    9:42 AM 06/28/2023   10:16 AM 05/31/2023    1:23 PM  PHQ 2/9 Scores  PHQ - 2 Score 0 0 0 0 0 0 0  PHQ- 9 Score 0 1     0    Fall Risk     05/20/2024   11:38 AM 05/19/2024   10:47 AM 05/06/2024   12:06 PM 12/04/2023    8:29 AM 08/19/2023   10:16 AM  Fall Risk   Falls in the past year? 0 0 0 0 0  Number falls in past yr: 0 0 0 0 0  Injury with Fall? 0 0 0 0 0  Risk for fall due to : No Fall Risks  No Fall Risks No Fall Risks No Fall  Risks  Follow up Falls evaluation completed;Falls prevention discussed  Falls evaluation completed Falls evaluation completed Falls evaluation completed    MEDICARE RISK AT HOME:  Medicare Risk at Home Any stairs in or around the home?: No If so, are there any without handrails?: No Home free of loose throw rugs in walkways, pet beds, electrical cords, etc?: No Adequate lighting in your home to reduce risk of falls?: Yes Life alert?: No Use of a cane, walker or w/c?: No Grab bars in the bathroom?: No Shower chair or bench in shower?: No Elevated toilet seat or a handicapped toilet?: No  TIMED UP AND GO:  Was the test performed?  No  Cognitive Function: 6CIT completed        05/20/2024   11:41 AM 05/20/2023    9:14 AM 05/15/2022    4:11 PM  6CIT Screen  What Year? 0 points 0 points 0 points  What month? 0 points 0 points 0 points  What time? 0 points 0 points 0 points  Count back from 20 0 points 0 points 0 points  Months in reverse 0  points 0 points 0 points  Repeat phrase 0 points 0 points 0 points  Total Score 0 points 0 points 0 points    Immunizations Immunization History  Administered Date(s) Administered   Dtap, Unspecified 12/31/1952, 12/31/1958   Fluad Quad(high Dose 65+) 09/16/2019, 10/31/2020, 09/14/2022   Fluad Trivalent(High Dose 65+) 08/28/2023   Influenza Split 10/25/2011, 10/22/2012   Influenza Whole 09/14/2010   Influenza,inj,Quad PF,6+ Mos 10/14/2013, 10/14/2014, 10/12/2015, 10/01/2016, 09/16/2017, 10/13/2018   Influenza-Unspecified 09/29/2021   PFIZER Comirnaty(Gray Top)Covid-19 Tri-Sucrose Vaccine 08/01/2021   PFIZER(Purple Top)SARS-COV-2 Vaccination 03/31/2020, 04/25/2020, 09/01/2020, 08/01/2021   Pfizer Covid-19 Vaccine Bivalent Booster 76yrs & up 11/20/2021   Pneumococcal Conjugate-13 05/12/2018   Pneumococcal Polysaccharide-23 09/16/2019   Rubella 02/25/1984   Td (Adult),unspecified 04/30/2002   Tdap 08/04/2013, 08/28/2023   Zoster  Recombinant(Shingrix ) 05/01/2021, 07/10/2021    Screening Tests Health Maintenance  Topic Date Due   INFLUENZA VACCINE  07/31/2024   Medicare Annual Wellness (AWV)  05/20/2025   MAMMOGRAM  08/25/2025   DTaP/Tdap/Td (4 - Td or Tdap) 08/27/2033   Pneumonia Vaccine 45+ Years old  Completed   DEXA SCAN  Completed   Hepatitis C Screening  Completed   Zoster Vaccines- Shingrix   Completed   HPV VACCINES  Aged Out   Meningococcal B Vaccine  Aged Out   Colonoscopy  Discontinued   COVID-19 Vaccine  Discontinued    Health Maintenance  There are no preventive care reminders to display for this patient. Health Maintenance Items Addressed:  05/20/2024   Additional Screening:  Vision Screening: Recommended annual ophthalmology exams for early detection of glaucoma and other disorders of the eye.  Dental Screening: Recommended annual dental exams for proper oral hygiene  Community Resource Referral / Chronic Care Management: CRR required this visit?  No   CCM required this visit?  No   Plan:    I have personally reviewed and noted the following in the patient's chart:   Medical and social history Use of alcohol, tobacco or illicit drugs  Current medications and supplements including opioid prescriptions. Patient is not currently taking opioid prescriptions. Functional ability and status Nutritional status Physical activity Advanced directives List of other physicians Hospitalizations, surgeries, and ER visits in previous 12 months Vitals Screenings to include cognitive, depression, and falls Referrals and appointments  In addition, I have reviewed and discussed with patient certain preventive protocols, quality metrics, and best practice recommendations. A written personalized care plan for preventive services as well as general preventive health recommendations were provided to patient.   Patria Bookbinder, CMA   05/20/2024   After Visit Summary: (MyChart) Due to this being  a telephonic visit, the after visit summary with patients personalized plan was offered to patient via MyChart   Notes: Nothing significant to report at this time.

## 2024-05-20 NOTE — Patient Instructions (Addendum)
 Tina Stone , Thank you for taking time out of your busy schedule to complete your Annual Wellness Visit with me. I enjoyed our conversation and look forward to speaking with you again next year. I, as well as your care team,  appreciate your ongoing commitment to your health goals. Please review the following plan we discussed and let me know if I can assist you in the future. Your Game plan/ To Do List    Follow up Visits: Next Medicare AWV with our clinical staff: 05/27/2025   Have you seen your provider in the last 6 months (3 months if uncontrolled diabetes)? Yes Next Office Visit with your provider: 05/26/2024 - Physical  Clinician Recommendations:  Aim for 30 minutes of exercise or brisk walking, 6-8 glasses of water , and 5 servings of fruits and vegetables each day.       This is a list of the screening recommended for you and due dates:  Health Maintenance  Topic Date Due   Flu Shot  07/31/2024   Medicare Annual Wellness Visit  05/20/2025   Mammogram  08/25/2025   DTaP/Tdap/Td vaccine (4 - Td or Tdap) 08/27/2033   Pneumonia Vaccine  Completed   DEXA scan (bone density measurement)  Completed   Hepatitis C Screening  Completed   Zoster (Shingles) Vaccine  Completed   HPV Vaccine  Aged Out   Meningitis B Vaccine  Aged Out   Colon Cancer Screening  Discontinued   COVID-19 Vaccine  Discontinued    Advanced directives: (Provided) Advance directive discussed with you today. I have provided a copy for you to complete at home and have notarized. Once this is complete, please bring a copy in to our office so we can scan it into your chart.  Advance Care Planning is important because it:  [x]  Makes sure you receive the medical care that is consistent with your values, goals, and preferences  [x]  It provides guidance to your family and loved ones and reduces their decisional burden about whether or not they are making the right decisions based on your wishes.  Follow the link provided  in your after visit summary or read over the paperwork we have mailed to you to help you started getting your Advance Directives in place. If you need assistance in completing these, please reach out to us  so that we can help you!

## 2024-05-26 ENCOUNTER — Ambulatory Visit (INDEPENDENT_AMBULATORY_CARE_PROVIDER_SITE_OTHER): Admitting: Internal Medicine

## 2024-05-26 ENCOUNTER — Encounter: Payer: Self-pay | Admitting: Internal Medicine

## 2024-05-26 VITALS — BP 136/86 | HR 57 | Temp 97.9°F | Resp 16 | Ht 62.0 in | Wt 224.4 lb

## 2024-05-26 DIAGNOSIS — I480 Paroxysmal atrial fibrillation: Secondary | ICD-10-CM

## 2024-05-26 DIAGNOSIS — I10 Essential (primary) hypertension: Secondary | ICD-10-CM | POA: Diagnosis not present

## 2024-05-26 DIAGNOSIS — E039 Hypothyroidism, unspecified: Secondary | ICD-10-CM

## 2024-05-26 DIAGNOSIS — Z0000 Encounter for general adult medical examination without abnormal findings: Secondary | ICD-10-CM

## 2024-05-26 DIAGNOSIS — K7689 Other specified diseases of liver: Secondary | ICD-10-CM

## 2024-05-26 DIAGNOSIS — E785 Hyperlipidemia, unspecified: Secondary | ICD-10-CM | POA: Diagnosis not present

## 2024-05-26 DIAGNOSIS — Z0001 Encounter for general adult medical examination with abnormal findings: Secondary | ICD-10-CM

## 2024-05-26 LAB — CBC WITH DIFFERENTIAL/PLATELET
Basophils Absolute: 0 10*3/uL (ref 0.0–0.1)
Basophils Relative: 0.9 % (ref 0.0–3.0)
Eosinophils Absolute: 0.1 10*3/uL (ref 0.0–0.7)
Eosinophils Relative: 2.2 % (ref 0.0–5.0)
HCT: 39.6 % (ref 36.0–46.0)
Hemoglobin: 12.7 g/dL (ref 12.0–15.0)
Lymphocytes Relative: 30.8 % (ref 12.0–46.0)
Lymphs Abs: 1.2 10*3/uL (ref 0.7–4.0)
MCHC: 32.1 g/dL (ref 30.0–36.0)
MCV: 77.2 fl — ABNORMAL LOW (ref 78.0–100.0)
Monocytes Absolute: 0.3 10*3/uL (ref 0.1–1.0)
Monocytes Relative: 7.4 % (ref 3.0–12.0)
Neutro Abs: 2.3 10*3/uL (ref 1.4–7.7)
Neutrophils Relative %: 58.7 % (ref 43.0–77.0)
Platelets: 212 10*3/uL (ref 150.0–400.0)
RBC: 5.13 Mil/uL — ABNORMAL HIGH (ref 3.87–5.11)
RDW: 15.5 % (ref 11.5–15.5)
WBC: 3.8 10*3/uL — ABNORMAL LOW (ref 4.0–10.5)

## 2024-05-26 LAB — BASIC METABOLIC PANEL WITH GFR
BUN: 14 mg/dL (ref 6–23)
CO2: 29 meq/L (ref 19–32)
Calcium: 9.6 mg/dL (ref 8.4–10.5)
Chloride: 101 meq/L (ref 96–112)
Creatinine, Ser: 0.82 mg/dL (ref 0.40–1.20)
GFR: 71.96 mL/min (ref >60.00)
Glucose, Bld: 93 mg/dL (ref 70–99)
Potassium: 4 meq/L (ref 3.5–5.1)
Sodium: 139 meq/L (ref 135–145)

## 2024-05-26 LAB — HEPATIC FUNCTION PANEL
ALT: 10 U/L (ref 0–35)
AST: 15 U/L (ref 0–37)
Albumin: 4.2 g/dL (ref 3.5–5.2)
Alkaline Phosphatase: 86 U/L (ref 39–117)
Bilirubin, Direct: 0.1 mg/dL (ref 0.0–0.3)
Total Bilirubin: 0.6 mg/dL (ref 0.2–1.2)
Total Protein: 7.4 g/dL (ref 6.0–8.3)

## 2024-05-26 LAB — URINALYSIS, ROUTINE W REFLEX MICROSCOPIC
Bilirubin Urine: NEGATIVE
Ketones, ur: NEGATIVE
Leukocytes,Ua: NEGATIVE
Nitrite: NEGATIVE
Specific Gravity, Urine: <=1.005 — AB (ref 1.000–1.030)
Total Protein, Urine: NEGATIVE
Urine Glucose: NEGATIVE
Urobilinogen, UA: 0.2 (ref 0.0–1.0)
pH: 6 (ref 5.0–8.0)

## 2024-05-26 LAB — LIPID PANEL
Cholesterol: 228 mg/dL — ABNORMAL HIGH (ref 0–200)
HDL: 81.8 mg/dL (ref >39.00)
LDL Cholesterol: 135 mg/dL — ABNORMAL HIGH (ref 0–99)
NonHDL: 145.96
Total CHOL/HDL Ratio: 3
Triglycerides: 56 mg/dL (ref 0.0–149.0)
VLDL: 11.2 mg/dL (ref 0.0–40.0)

## 2024-05-26 LAB — TSH: TSH: 2.58 u[IU]/mL (ref 0.35–5.50)

## 2024-05-26 NOTE — Progress Notes (Unsigned)
 Subjective:  Patient ID: Tina Stone, female    DOB: 02-12-1950  Age: 74 y.o. MRN: [id number]  CC: Annual Exam (Patient wants HEB B blood work. No other concerns. ), Hypertension, and Hyperlipidemia   HPI Tina Stone presents for a CPX and f/up -----   Discussed the use of AI scribe software for clinical note transcription with the patient, who gave verbal consent to proceed.  History of Present Illness   Dynesha Stone is a 74 year old female who presents with concerns about elevated blood pressure.  She feels well overall but is concerned about her elevated blood pressure. She recently consulted a cardiologist who confirmed that her heart is in good condition. She experiences no symptoms typically associated with hypertension, such as headaches, blurred vision, chest pain, shortness of breath, dizziness, lightheadedness, or peripheral edema.  She is interested in hepatitis B testing due to an option provided by her workplace and wants to determine her immunity status. She denies any known exposure to hepatitis A or B in the past.       Outpatient Medications Prior to Visit  Medication Sig Dispense Refill   acetaminophen  (TYLENOL ) 500 MG tablet Take 1,000 mg by mouth every 6 (six) hours as needed for moderate pain.     apixaban  (ELIQUIS ) 5 MG TABS tablet Take 1 tablet (5 mg total) by mouth 2 (two) times daily. 60 tablet 2   APPLE CIDER VINEGAR PO Take 5 mLs by mouth as needed.     atenolol  (TENORMIN ) 25 MG tablet Take 1 tablet (25 mg total) by mouth every evening. 30 tablet 2   Cholecalciferol (VITAMIN D3) 50 MCG (2000 UT) TABS Take by mouth.     EPINEPHrine  (EPIPEN  2-PAK) 0.3 mg/0.3 mL IJ SOAJ injection Inject 0.3 mg into the muscle as needed for anaphylaxis. 2 each 1   Evolocumab  (REPATHA  SURECLICK) 140 MG/ML SOAJ Inject 140 mg into the skin every 14 (fourteen) days. 2 mL 11   famotidine  (PEPCID ) 20 MG tablet TAKE 1 TABLET (20 MG TOTAL) BY MOUTH 2 (TWO) TIMES DAILY  AS NEEDED (HIVES). 180 tablet 1   indapamide  (LOZOL ) 1.25 MG tablet TAKE 1 TABLET BY MOUTH DAILY. 90 tablet 0   levocetirizine (XYZAL ) 5 MG tablet Take 1 tablet (5 mg total) by mouth 2 (two) times daily as needed (hives). 30 tablet 5   levothyroxine  (SYNTHROID ) 75 MCG tablet TAKE 1 TABLET BY MOUTH EVERY DAY IN THE MORNING 90 tablet 1   Multiple Vitamin (MULTIVITAMIN) capsule Take 1 capsule by mouth daily.     NON FORMULARY Herbal Ginger and lemon tea     zinc gluconate 50 MG tablet Take 50 mg by mouth daily.     No facility-administered medications prior to visit.    ROS Review of Systems  Constitutional:  Negative for appetite change, chills, diaphoresis and fatigue.  HENT: Negative.    Eyes: Negative.   Respiratory:  Negative for cough, chest tightness, shortness of breath and wheezing.   Cardiovascular:  Negative for chest pain and leg swelling.  Gastrointestinal:  Negative for abdominal pain, constipation, diarrhea, nausea and vomiting.  Endocrine: Negative.   Genitourinary: Negative.  Negative for difficulty urinating.  Musculoskeletal: Negative.  Negative for arthralgias and myalgias.  Skin: Negative.   Neurological:  Negative for dizziness and weakness.  Hematological:  Negative for adenopathy. Does not bruise/bleed easily.  Psychiatric/Behavioral: Negative.      Objective:  BP 136/86 (BP Location: Left Arm, Patient Position: Sitting, Cuff  Size: Normal)   Pulse (!) 57   Temp 97.9 F (36.6 C) (Oral)   Resp 16   Ht 5\' 2"  (1.575 m)   Wt 224 lb 6.4 oz (101.8 kg)   SpO2 95%   BMI 41.04 kg/m   BP Readings from Last 3 Encounters:  05/26/24 136/86  05/15/24 130/78  05/06/24 (!) 144/82    Wt Readings from Last 3 Encounters:  05/26/24 224 lb 6.4 oz (101.8 kg)  05/20/24 222 lb (100.7 kg)  05/15/24 222 lb 12.8 oz (101.1 kg)    Physical Exam Vitals reviewed.  Constitutional:      Appearance: Normal appearance.  HENT:     Mouth/Throat:     Mouth: Mucous membranes  are moist.  Eyes:     General: No scleral icterus.    Conjunctiva/sclera: Conjunctivae normal.  Cardiovascular:     Rate and Rhythm: Bradycardia present.     Heart sounds: No murmur heard.    No friction rub. No gallop.  Pulmonary:     Effort: Pulmonary effort is normal.     Breath sounds: No stridor. No wheezing, rhonchi or rales.  Abdominal:     General: Abdomen is flat.     Palpations: There is no mass.     Tenderness: There is no abdominal tenderness. There is no guarding.     Hernia: No hernia is present.  Musculoskeletal:        General: Normal range of motion.     Cervical back: Neck supple.     Right lower leg: No edema.     Left lower leg: No edema.  Lymphadenopathy:     Cervical: No cervical adenopathy.  Skin:    General: Skin is warm and dry.  Neurological:     General: No focal deficit present.     Mental Status: She is alert. Mental status is at baseline.  Psychiatric:        Mood and Affect: Mood normal.        Behavior: Behavior normal.        Thought Content: Thought content normal.        Judgment: Judgment normal.     Lab Results  Component Value Date   WBC 3.8 (L) 05/26/2024   HGB 12.7 05/26/2024   HCT 39.6 05/26/2024   PLT 212.0 05/26/2024   GLUCOSE 93 05/26/2024   CHOL 228 (H) 05/26/2024   TRIG 56.0 05/26/2024   HDL 81.80 05/26/2024   LDLCALC 135 (H) 05/26/2024   ALT 10 05/26/2024   AST 15 05/26/2024   NA 139 05/26/2024   K 4.0 05/26/2024   CL 101 05/26/2024   CREATININE 0.82 05/26/2024   BUN 14 05/26/2024   CO2 29 05/26/2024   TSH 2.58 05/26/2024   INR 1.0 01/28/2021   HGBA1C 5.3 02/02/2022    DG Lumbar Spine Complete Result Date: 05/06/2024 CLINICAL DATA:  Low back pain. EXAM: LUMBAR SPINE - COMPLETE 4+ VIEW COMPARISON:  August 09, 2023. FINDINGS: There is no evidence of lumbar spine fracture. Alignment is normal. Mild degenerative disc disease is noted at all levels of the lumbar spine. IMPRESSION: Multilevel degenerative changes as  noted above. No acute abnormality seen. Electronically Signed   By: Rosalene Colon M.D.   On: 05/06/2024 14:11    Assessment & Plan:  Benign liver cyst -     Hepatic function panel; Future -     Hepatitis B core antibody, total; Future -     Hepatitis B surface  antibody,qualitative; Future  Encounter for general adult medical examination with abnormal findings  Acquired hypothyroidism -     TSH; Future  Hyperlipidemia with target LDL less than 100 -     Lipid panel; Future  Paroxysmal atrial fibrillation (HCC) -     TSH; Future  Essential hypertension -     Basic metabolic panel with GFR; Future -     CBC with Differential/Platelet; Future -     Urinalysis, Routine w reflex microscopic; Future     Follow-up: Return in about 6 months (around 11/26/2024).  Sandra Crouch, MD

## 2024-05-26 NOTE — Patient Instructions (Signed)

## 2024-05-27 ENCOUNTER — Ambulatory Visit: Payer: Self-pay | Admitting: Internal Medicine

## 2024-05-27 LAB — HEPATITIS B CORE ANTIBODY, TOTAL: Hep B Core Total Ab: NONREACTIVE

## 2024-05-27 LAB — HEPATITIS B SURFACE ANTIBODY,QUALITATIVE: Hep B S Ab: NONREACTIVE

## 2024-05-30 ENCOUNTER — Other Ambulatory Visit: Payer: Self-pay | Admitting: Internal Medicine

## 2024-05-30 ENCOUNTER — Other Ambulatory Visit: Payer: Self-pay | Admitting: Family

## 2024-05-30 DIAGNOSIS — I480 Paroxysmal atrial fibrillation: Secondary | ICD-10-CM

## 2024-05-30 DIAGNOSIS — D6869 Other thrombophilia: Secondary | ICD-10-CM

## 2024-05-30 DIAGNOSIS — I10 Essential (primary) hypertension: Secondary | ICD-10-CM

## 2024-06-05 ENCOUNTER — Other Ambulatory Visit: Payer: Self-pay | Admitting: Family

## 2024-06-05 ENCOUNTER — Other Ambulatory Visit: Payer: Self-pay

## 2024-06-05 DIAGNOSIS — I10 Essential (primary) hypertension: Secondary | ICD-10-CM

## 2024-06-05 DIAGNOSIS — I480 Paroxysmal atrial fibrillation: Secondary | ICD-10-CM

## 2024-06-05 MED ORDER — ATENOLOL 25 MG PO TABS: 25.0000 mg | ORAL_TABLET | Freq: Every evening | ORAL | 2 refills | Status: DC

## 2024-06-08 ENCOUNTER — Ambulatory Visit (INDEPENDENT_AMBULATORY_CARE_PROVIDER_SITE_OTHER): Admitting: Internal Medicine

## 2024-06-08 ENCOUNTER — Encounter: Payer: Self-pay | Admitting: Internal Medicine

## 2024-06-08 ENCOUNTER — Other Ambulatory Visit: Payer: Self-pay

## 2024-06-08 VITALS — BP 132/80 | HR 60 | Temp 98.2°F | Ht 63.39 in | Wt 216.1 lb

## 2024-06-08 DIAGNOSIS — Z91018 Allergy to other foods: Secondary | ICD-10-CM | POA: Diagnosis not present

## 2024-06-08 DIAGNOSIS — L508 Other urticaria: Secondary | ICD-10-CM

## 2024-06-08 DIAGNOSIS — R1112 Projectile vomiting: Secondary | ICD-10-CM | POA: Diagnosis not present

## 2024-06-08 MED ORDER — LEVOCETIRIZINE DIHYDROCHLORIDE 5 MG PO TABS: 5.0000 mg | ORAL_TABLET | Freq: Two times a day (BID) | ORAL | 5 refills | Status: DC | PRN

## 2024-06-08 NOTE — Progress Notes (Signed)
 FOLLOW UP Date of Service/Encounter:  06/08/24  Subjective:  Tina Stone (DOB: 12-06-1950) is a 74 y.o. female who [facility] in re-evaluation of the following: chronic urticaria, food allergies History obtained from: chart review and patient.  For Review, LV was on 02/11/24  with Dr. Lydia Sams seen for routine follow-up. See below for summary of history and diagnostics.   Therapeutic plans/changes recommended: reported return of hives, concerned for food allergies, not interested at that time in reintroduction or retesting of treenuts, shellfish or sesame which she was avoiding. ----------------------------------------------------- Pertinent History/Diagnostics:  Allergic rhinitis:  - total IgE 742 - respiratory panel 08/2021: positives pigweed, ragweed, [place], [place], [place], pecan/hickory tree, elm, cedar, box elder, cockroach, dog, cat, dust mites -10/20/2021: SPT environmentals-positive to grasses, mugwort, trees Concern for food allergy:                 - hx of reaction: tree nuts and shellfish- upset stomach, nausea on multiple occasions with these foods within 15 minutes to an hour, has avoided since 2000 - food panel 08/2021: positive to peanut (1.98), wheat (0.98), walnut (1.08), soy (0.39), sesame (0.62), hazelnut (0.36), almonds (0.62). -10/20/2021: SPT to select food allergens (peanut, soy, wheat, sesame, milk, egg, casting, shellfish, fish, cashew and mushrooms) negative - eating wheat, soy, peanut, cashews and pistachios. And other tree nuts. - avoiding sesame, shellfish Chronic hives:  - Hives labs: CBCd (chronic anemia), tryptase (wnl-5), alpha-gal (negative), total IgE 830 - TSH 11/04-low <0.01, T4 wnl (1.25) - no hives for several years   --------------------------------------------------- Today presents for follow-up. Discussed the use of AI scribe software for clinical note transcription with the patient, who  gave verbal consent to proceed.  History of Present Illness   Tina Stone is a 74 year old female who presents for follow-up and evaluation of potential shellfish and sesame allergies.  She has a history of food allergies and is currently avoiding shellfish due to previous reactions of vomiting and nausea on 2 separate occasions with crab. Despite negative allergy tests for shellfish, she remains cautious because of past experiences of nausea and vomiting shortly after consuming crab. She has not experienced swelling or other severe allergic reactions.  Regarding sesame, she has not consumed it recently but recalls no past reactions when she did consume foods containing sesame, such as hamburger buns. She is interested in retesting for these allergies to potentially reintroduce them into her diet.  She consumes peanuts and tree nuts, including cashews and pistachios, without issue and has not experienced any adverse reactions. She eats other tree nuts as well.  She inquires about chocolate allergies due to occasional consumption of dark chocolate, but there is no record of testing for chocolate allergies. She has not experienced any reactions to chocolate. She only has a family history.   She reports no hives in several years since taking one tablet of xyzal . She is no longer on this medication but carries it with her to take if needed.       All medications reviewed by clinical staff and updated in chart. No new pertinent medical or surgical history except as noted in HPI.  ROS: All others negative except as noted per HPI.   Objective:  BP 132/80   Pulse 60   Temp 98.2 F (36.8 C)   Ht 5' 3.39" (1.61 m)   Wt 216 lb 1.6 oz (98 kg)   SpO2 98%   BMI 37.82 kg/m  Body mass index is 37.82 kg/m. Physical Exam: General Appearance:  Alert, cooperative, no distress, appears stated age  Head:  Normocephalic, without obvious abnormality, atraumatic  Eyes:  Conjunctiva clear, EOM's  intact  Ears EACs normal bilaterally and normal TMs bilaterally  Nose: Nares normal, hypertrophic turbinates and normal mucosa  Throat: Lips, tongue normal; teeth and gums normal, normal posterior oropharynx  Neck: Supple, symmetrical  Lungs:   clear to auscultation bilaterally, Respirations unlabored, no coughing  Heart:  regular rate and rhythm and no murmur, Appears well perfused  Extremities: No edema  Skin: Skin color, texture, turgor normal and no rashes or lesions on visualized portions of skin  Neurologic: No gross deficits   Labs:  Lab Orders         Allergen Profile, Shellfish         Allergen Sesame f10       Assessment/Plan   Chronic Idiopathic Urticaria (Hives): Resolved. - At this time etiology of hives and swelling is unknown. Hives can be caused by a variety of different triggers including illness/infection, pressure, vibrations, extremes of temperature to name a few however majority of the time there is no identifiable trigger.  -Continue Xyzl 5mg  daily as needed..   -If no improvement in a few days, increase to Xyzal  5mg  twice daily.   -If still no improvement in 2-3 days, add Pepcid  20mg  twice daily and continue Xyzal  5mg  twice daily.  Food Allergy: Stable Nausea and vomiting immediately after crab consumption x 2. Last eaten in early 2000s. Considered FPIES but history of onset symptoms not consistent. Sesame avoiding and sensitized. Previously tolerating. Has avoided for many years.  - please strictly avoid shellfish and sesame; labs today. Consider reintroduction. - for SKIN only reaction, okay to take Benadryl  25mg  capsules every 6 hours as needed - for SKIN + ANY additional symptoms, OR IF concern for LIFE THREATENING reaction = Epipen  Autoinjector EpiPen  0.3 mg. Reports epipen  uptodate. - If using Epinephrine  autoinjector, call 911 or go to the ER.  Follow up : 12 months, sooner if needed It was a pleasure seeing you again in clinic today! Thank you for  allowing me to participate in your care.  Other: none  Jonathon Neighbors, MD  [facility] 

## 2024-06-08 NOTE — Patient Instructions (Addendum)
 Chronic Idiopathic Urticaria (Hives): Resolved. - At this time etiology of hives and swelling is unknown. Hives can be caused by a variety of different triggers including illness/infection, pressure, vibrations, extremes of temperature to name a few however majority of the time there is no identifiable trigger.  -Continue Xyzl 5mg  daily as needed..   -If no improvement in a few days, increase to Xyzal  5mg  twice daily.   -If still no improvement in 2-3 days, add Pepcid  20mg  twice daily and continue Xyzal  5mg  twice daily.  Food Allergy:  Nausea and vomiting immediately after crab consumption x 2. Last eaten in early 2000s. Considered FPIES but history of onset symptoms not consistent. Sesame avoiding and sensitized. Previously tolerating. Has avoided for many years.  - please strictly avoid shellfish and sesame; labs today. Consider reintroduction. - for SKIN only reaction, okay to take Benadryl  25mg  capsules every 6 hours as needed - for SKIN + ANY additional symptoms, OR IF concern for LIFE THREATENING reaction = Epipen  Autoinjector EpiPen  0.3 mg. - If using Epinephrine  autoinjector, call 911 or go to the ER.  Follow up : 12 months, sooner if needed It was a pleasure seeing you again in clinic today! Thank you for allowing me to participate in your care.  Jonathon Neighbors, MD [facility]

## 2024-06-10 ENCOUNTER — Ambulatory Visit: Payer: Self-pay | Admitting: Internal Medicine

## 2024-06-10 LAB — ALLERGEN PROFILE, SHELLFISH
Clam IgE: 0.4 kU/L — AB
F023-IgE Crab: 0.67 kU/L — AB
F080-IgE Lobster: 0.36 kU/L — AB
F290-IgE Oyster: <0.1 kU/L
Scallop IgE: 0.15 kU/L — AB
Shrimp IgE: 0.21 kU/L — AB

## 2024-06-10 LAB — ALLERGEN SESAME F10: Sesame Seed IgE: 0.69 kU/L — AB

## 2024-06-10 NOTE — Progress Notes (Signed)
 Please let Tina Stone know that her crab, lobster and clam are positive. Given her history would recommend ongoing avoidance. Sesame is also positive. I would recommend ongoing avoidance as well.

## 2024-06-19 ENCOUNTER — Inpatient Hospital Stay: Attending: Hematology and Oncology

## 2024-06-19 ENCOUNTER — Encounter (HOSPITAL_COMMUNITY): Payer: Self-pay

## 2024-06-19 ENCOUNTER — Ambulatory Visit (HOSPITAL_COMMUNITY)
Admission: RE | Admit: 2024-06-19 | Discharge: 2024-06-19 | Disposition: A | Discharge status: Home / Self Care | Source: Ambulatory Visit | Attending: Hematology and Oncology | Admitting: Hematology and Oncology

## 2024-06-19 DIAGNOSIS — C541 Malignant neoplasm of endometrium: Secondary | ICD-10-CM | POA: Insufficient documentation

## 2024-06-19 DIAGNOSIS — K5900 Constipation, unspecified: Secondary | ICD-10-CM | POA: Diagnosis not present

## 2024-06-19 DIAGNOSIS — K5730 Diverticulosis of large intestine without perforation or abscess without bleeding: Secondary | ICD-10-CM | POA: Diagnosis not present

## 2024-06-19 LAB — CBC WITH DIFFERENTIAL (CANCER CENTER ONLY)
Abs Immature Granulocytes: 0.02 10*3/uL (ref 0.00–0.07)
Basophils Absolute: 0 10*3/uL (ref 0.0–0.1)
Basophils Relative: 1 %
Eosinophils Absolute: 0.1 10*3/uL (ref 0.0–0.5)
Eosinophils Relative: 2 %
HCT: 35.8 % — ABNORMAL LOW (ref 36.0–46.0)
Hemoglobin: 12.1 g/dL (ref 12.0–15.0)
Immature Granulocytes: 1 %
Lymphocytes Relative: 29 %
Lymphs Abs: 1.1 10*3/uL (ref 0.7–4.0)
MCH: 25.2 pg — ABNORMAL LOW (ref 26.0–34.0)
MCHC: 33.8 g/dL (ref 30.0–36.0)
MCV: 74.4 fL — ABNORMAL LOW (ref 80.0–100.0)
Monocytes Absolute: 0.4 10*3/uL (ref 0.1–1.0)
Monocytes Relative: 10 %
Neutro Abs: 2.3 10*3/uL (ref 1.7–7.7)
Neutrophils Relative %: 57 %
Platelet Count: 192 10*3/uL (ref 150–400)
RBC: 4.81 MIL/uL (ref 3.87–5.11)
RDW: 15.3 % (ref 11.5–15.5)
WBC Count: 3.9 10*3/uL — ABNORMAL LOW (ref 4.0–10.5)
nRBC: 0 % (ref 0.0–0.2)

## 2024-06-19 LAB — CMP (CANCER CENTER ONLY)
ALT: 11 U/L (ref 0–44)
AST: 16 U/L (ref 15–41)
Albumin: 3.8 g/dL (ref 3.5–5.0)
Alkaline Phosphatase: 84 U/L (ref 38–126)
Anion gap: 6 (ref 5–15)
BUN: 7 mg/dL — ABNORMAL LOW (ref 8–23)
CO2: 28 mmol/L (ref 22–32)
Calcium: 9 mg/dL (ref 8.9–10.3)
Chloride: 106 mmol/L (ref 98–111)
Creatinine: 0.78 mg/dL (ref 0.44–1.00)
GFR, Estimated: >60 mL/min (ref >60)
Glucose, Bld: 91 mg/dL (ref 70–99)
Potassium: 3.9 mmol/L (ref 3.5–5.1)
Sodium: 140 mmol/L (ref 135–145)
Total Bilirubin: 0.6 mg/dL (ref 0.0–1.2)
Total Protein: 6.8 g/dL (ref 6.5–8.1)

## 2024-06-26 ENCOUNTER — Encounter: Payer: Self-pay | Admitting: Hematology and Oncology

## 2024-06-26 ENCOUNTER — Inpatient Hospital Stay (HOSPITAL_BASED_OUTPATIENT_CLINIC_OR_DEPARTMENT_OTHER): Admitting: Hematology and Oncology

## 2024-06-26 VITALS — BP 159/76 | HR 62 | Temp 99.5°F | Resp 18 | Ht 63.39 in | Wt 219.4 lb

## 2024-06-26 DIAGNOSIS — C541 Malignant neoplasm of endometrium: Secondary | ICD-10-CM | POA: Diagnosis not present

## 2024-06-26 DIAGNOSIS — I10 Essential (primary) hypertension: Secondary | ICD-10-CM | POA: Diagnosis not present

## 2024-06-26 DIAGNOSIS — D563 Thalassemia minor: Secondary | ICD-10-CM

## 2024-06-26 NOTE — Progress Notes (Signed)
 [facility] OFFICE PROGRESS NOTE  Patient Care Team: Joshua Debby CROME, MD as PCP - General (Internal Medicine) Mona Vinie BROCKS, MD as PCP - Cardiology (Cardiology) Jerrell Cleatus Debby, MD Fleeta Zerita DASEN, MD as Consulting Physician (Ophthalmology) Merceda Lela SAUNDERS, [facility] (Pharmacist) Fleeta Zerita DASEN, MD as Consulting Physician (Ophthalmology)  Assessment & Plan Endometrial cancer [facility]) She was urgently diagnosed with stage II uterine cancer in 2020 status post surgery.   Final pathology: Poorly differentiated carcinoma, mixed histology with squamous differentiation, some focus of clear cells and serous features, MSI stable, HER2/neu negative She was noted to have recurrent disease in May 2021 status post chemotherapy with carboplatin  and paclitaxel  with complete response.  She had recurrence again in November 2021 and was treated with lenvatinib  with pembrolizumab  with complete response, last treatment in January 2022  I reviewed blood work and imaging study with the patient which show no evidence of cancer recurrence Due to high risk disease, I recommend close surveillance with another imaging study in a year I will arrange for GYN oncology follow-up for pelvic exam  Essential hypertension Her blood pressure is intermittently elevated, could be due to anxiety She will continue follow-up with primary care doctor Beta thalassemia trait She has thalassemia trait She is not symptomatic Observe only  Orders Placed This Encounter  Procedures   CT ABDOMEN PELVIS WO CONTRAST    Standing Status:   Future    Expected Date:   06/21/2025    Expiration Date:   06/26/2025    Preferred imaging location?:   [facility]    If indicated for the ordered procedure, I authorize the administration of oral contrast media per Radiology protocol:   No    Reason for no oral contrast::   no need oral contrast   CMP (Cancer Center only)    Standing Status:   Future    Expiration  Date:   06/26/2025   CBC with Differential (Cancer Center Only)    Standing Status:   Future    Expiration Date:   06/26/2025     Almarie Bedford, MD  INTERVAL HISTORY: she returns for surveillance follow-up for recurrent uterine cancer She is currently on active surveillance She is asymptomatic She has not have pelvic exam done for some time We discussed and reviewed imaging study and blood work  PHYSICAL EXAMINATION: ECOG PERFORMANCE STATUS: 0 - Asymptomatic  Vitals:   06/26/24 0756  BP: (!) 159/76  Pulse: 62  Resp: 18  Temp: 99.5 F (37.5 C)  SpO2: 100%   Filed Weights   06/26/24 0756  Weight: 219 lb 6.4 oz (99.5 kg)    Relevant data reviewed during this visit included CBC, CMP, CT imaging from June 2025

## 2024-06-26 NOTE — Assessment & Plan Note (Addendum)
 She was urgently diagnosed with stage II uterine cancer in 2020 status post surgery.   Final pathology: Poorly differentiated carcinoma, mixed histology with squamous differentiation, some focus of clear cells and serous features, MSI stable, HER2/neu negative She was noted to have recurrent disease in May 2021 status post chemotherapy with carboplatin  and paclitaxel  with complete response.  She had recurrence again in November 2021 and was treated with lenvatinib  with pembrolizumab  with complete response, last treatment in January 2022  I reviewed blood work and imaging study with the patient which show no evidence of cancer recurrence Due to high risk disease, I recommend close surveillance with another imaging study in a year I will arrange for GYN oncology follow-up for pelvic exam

## 2024-06-26 NOTE — Assessment & Plan Note (Addendum)
 Her blood pressure is intermittently elevated, could be due to anxiety She will continue follow-up with primary care doctor

## 2024-06-26 NOTE — Assessment & Plan Note (Addendum)
She has thalassemia trait She is not symptomatic Observe only 

## 2024-06-29 ENCOUNTER — Telehealth: Payer: Self-pay | Admitting: Oncology

## 2024-06-29 NOTE — Telephone Encounter (Signed)
 Left a message with appointment to see Dr. Olam Mill on 07/29/2024 at 12:45.  Requested a return call to confirm.

## 2024-06-29 NOTE — Telephone Encounter (Signed)
 Nuvia called back and confirmed the appointment on 07/29/2024 at 12:45 with Dr. Olam Mill.

## 2024-07-07 ENCOUNTER — Ambulatory Visit: Attending: Cardiology | Admitting: Pharmacist

## 2024-07-07 VITALS — Wt 216.0 lb

## 2024-07-07 DIAGNOSIS — E66812 Obesity, class 2: Secondary | ICD-10-CM

## 2024-07-07 NOTE — Progress Notes (Signed)
 Patient ID: Tina Stone                 DOB: 02-23-1950                    MRN: [id number]     HPI: Tina Stone is a 74 y.o. female patient referred to pharmacy clinic by Dr. Mona to initiate GLP1-RA therapy. PMH is significant for hyperlipidemia, hypertension, A-fib, aortic atherosclerosis and obesity. Most recent BMI 37.79 kg/m .  Patient presents today to Pharm.D. clinic.  She has Medicare.  Unfortunately Medicare will not cover weight loss medications.  She has not had an MI, stroke or PAD therefore Wegovy would not be covered.  She does not have obstructive sleep apnea nor does she have any symptoms that would suggest she may have OSA other than her weight.  Although she has not had an A1c in a couple years, her glucose on BMP which was nonfasting was 98 therefore unlikely she is diabetic.  Patient disappointed that she would not qualify for GLP-1.  She has made several changes to her diet and exercise and feels as though she has been losing some weight.  She is focusing on protein in her meal along with vegetables.  She has been working hard to limit sweets.  Doing water  aerobics 2-3 times a week for 1 hour each session.  Diet: salmon, yogurt, beans, eggs, ground [place] Fruit/veggies Drink: water  Not a lot of bread or sweets  Exercise: water  areobics 1hr 2-3 times per week,   Family History:  Family History  Problem Relation Age of Onset   Diabetes Mother    Hypertension Mother    Colon polyps Mother 1   Dementia Mother 47   Diabetes Father    Congestive Heart Failure Father    Asthma Brother    Diabetes Brother    Hypertension Brother    Hypertension Brother    Hypertension Maternal Grandmother    Diabetes Maternal Grandmother    Asthma Paternal Grandmother    Lung disease Paternal Grandmother    Diabetes Paternal Grandfather    Pancreatic cancer Paternal Aunt    Uterine cancer Paternal Aunt    Colon cancer Neg Hx    Breast cancer Neg Hx    Lung cancer Neg  Hx    Esophageal cancer Neg Hx    Rectal cancer Neg Hx    Stomach cancer Neg Hx      Social History: no ETOH, no tobacco  Labs: Lab Results  Component Value Date   HGBA1C 5.3 02/02/2022    Wt Readings from Last 1 Encounters:  06/26/24 219 lb 6.4 oz (99.5 kg)    BP Readings from Last 1 Encounters:  06/26/24 (!) 159/76   Pulse Readings from Last 1 Encounters:  06/26/24 62       Component Value Date/Time   CHOL 228 (H) 05/26/2024 0921   CHOL 201 (H) 08/16/2023 1159   TRIG 56.0 05/26/2024 0921   HDL 81.80 05/26/2024 0921   HDL 79 08/16/2023 1159   CHOLHDL 3 05/26/2024 0921   VLDL 11.2 05/26/2024 0921   LDLCALC 135 (H) 05/26/2024 0921   LDLCALC 112 (H) 08/16/2023 1159    Past Medical History:  Diagnosis Date   Adenomatous colon polyp    Allergic rhinitis, seasonal    Allergy    Atrial fibrillation (HCC)    Beta thalassemia trait 11/25/2013   Cholelithiasis    Class 3 obesity without serious comorbidity with  body mass index (BMI) of 40.0 to 44.9 in adult 11/19/2012   DDD (degenerative disc disease), lumbar    endometrial ca dx'd 08/2009   endometrial    GERD (gastroesophageal reflux disease)    H. pylori infection    History of radiation therapy    endometrial - 02/19/2022-03/23/2022 Dr Lynwood Nasuti   HLD (hyperlipidemia)    Hypercholesterolemia    Hypertension 03/18/2017   no meds    Hypothyroidism    Intraductal papilloma of left breast    Patient underwent left needle-localized lumpectomy by Dr. Donnice POUR. Tsuei on 09/09/2013; pathology showed intraductal papilloma with no atypia or malignancy identified.   Morbid (severe) obesity due to excess calories (HCC) 09/27/2023   bmi 40.38   PONV (postoperative nausea and vomiting)    Pre-diabetes    pt denies   Sciatica    Spinal stenosis    Urticaria    Uterine fibroid     Current Outpatient Medications on File Prior to Visit  Medication Sig Dispense Refill   acetaminophen  (TYLENOL ) 500 MG tablet Take  1,000 mg by mouth every 6 (six) hours as needed for moderate pain.     APPLE CIDER VINEGAR PO Take 5 mLs by mouth as needed.     atenolol  (TENORMIN ) 25 MG tablet TAKE 1 TABLET (25 MG TOTAL) BY MOUTH DAILY. (Patient not taking: Reported on 06/08/2024) 90 tablet 0   atenolol  (TENORMIN ) 25 MG tablet Take 1 tablet (25 mg total) by mouth every evening. 30 tablet 2   Cholecalciferol (VITAMIN D3) 50 MCG (2000 UT) TABS Take by mouth.     ELIQUIS  5 MG TABS tablet TAKE 1 TABLET BY MOUTH TWICE A DAY 60 tablet 2   EPINEPHrine  (EPIPEN  2-PAK) 0.3 mg/0.3 mL IJ SOAJ injection Inject 0.3 mg into the muscle as needed for anaphylaxis. (Patient not taking: Reported on 06/08/2024) 2 each 1   Evolocumab  (REPATHA  SURECLICK) 140 MG/ML SOAJ Inject 140 mg into the skin every 14 (fourteen) days. 2 mL 11   indapamide  (LOZOL ) 1.25 MG tablet TAKE 1 TABLET BY MOUTH DAILY. 90 tablet 0   levocetirizine (XYZAL ) 5 MG tablet Take 1 tablet (5 mg total) by mouth 2 (two) times daily as needed (hives). 30 tablet 5   levothyroxine  (SYNTHROID ) 75 MCG tablet TAKE 1 TABLET BY MOUTH EVERY DAY IN THE MORNING 90 tablet 1   Multiple Vitamin (MULTIVITAMIN) capsule Take 1 capsule by mouth daily.     NON FORMULARY Herbal Ginger and lemon tea     zinc gluconate 50 MG tablet Take 50 mg by mouth daily.     No current facility-administered medications on file prior to visit.    Allergies  Allergen Reactions   Benicar  Hct [Olmesartan  Medoxomil-Hctz] Shortness Of Breath and Palpitations   Iodinated Contrast Media Hives and Itching    02/02/2021-  pt developed 2 hives and itching even with the 13hr premedication.  Radiology PA came into eval the pt.  Developed itching and hives after injection on 05/10/20; needs 13hr prep in future   Crestor  [Rosuvastatin ] Other (See Comments)    Muscle aches   Bactrim  [Sulfamethoxazole -Trimethoprim ] Other (See Comments)    Abdominal pain, dizziness   Pravachol  [Pravastatin ] Other (See Comments)    Lower abdominal  pain   Shellfish Allergy Nausea And Vomiting   Amlodipine  Palpitations     Assessment/Plan:  1. Weight loss -patient is insurance will not cover GLP-1 therapy.  She has started several lifestyle modifications to help her lose weight.  I congratulated her on this and encouraged her to continue.  I did encourage her to add in some strength training and to increase her water  aerobics frequency.  Szymon Foiles D Meshulem Onorato, Pharm.JONETTA SARAN, CPP [facility] [address] [place], [place] [address]  Phone: [phone]; Fax: [phone]

## 2024-07-14 ENCOUNTER — Other Ambulatory Visit: Payer: Self-pay | Admitting: Internal Medicine

## 2024-07-14 DIAGNOSIS — Z1231 Encounter for screening mammogram for malignant neoplasm of breast: Secondary | ICD-10-CM

## 2024-07-29 ENCOUNTER — Inpatient Hospital Stay: Attending: Hematology and Oncology | Admitting: Obstetrics & Gynecology

## 2024-07-29 ENCOUNTER — Encounter: Payer: Self-pay | Admitting: Obstetrics & Gynecology

## 2024-07-29 VITALS — BP 133/68 | HR 60 | Temp 98.2°F | Resp 20 | Wt 216.2 lb

## 2024-07-29 DIAGNOSIS — C541 Malignant neoplasm of endometrium: Secondary | ICD-10-CM

## 2024-07-29 DIAGNOSIS — Z8542 Personal history of malignant neoplasm of other parts of uterus: Secondary | ICD-10-CM | POA: Insufficient documentation

## 2024-07-29 DIAGNOSIS — Z9221 Personal history of antineoplastic chemotherapy: Secondary | ICD-10-CM | POA: Insufficient documentation

## 2024-07-29 NOTE — Assessment & Plan Note (Signed)
 74 yo w/recurrent EC Negative symptom review, normal exam.   -Return prn or in 6 mos

## 2024-07-29 NOTE — Progress Notes (Signed)
 Follow Up Note: Gyn-Onc  Tina Stone 74 y.o. female  CC: Surveillance visit   HPI: The oncology history was reviewed. H/O stage II uterine cancer in 2020 status post surgery.   Final pathology: Poorly differentiated carcinoma, mixed histology with squamous differentiation, some focus of clear cells and serous features, MSI stable, HER2/neu negative She was noted to have recurrent disease in May 2021 status post chemotherapy with carboplatin  and paclitaxel  with complete response.  She had recurrence again in November 2021 and was treated with lenvatinib  with pembrolizumab  with complete response, last treatment in January 2022. She is seen at the request of Dr. Lonn for a pelvic exam.   Interval History:She denies any vaginal bleeding, abdominal/pelvic pain, cough, lethargy or increasing abdominal girth. She is working on reducing her weight. Discussed the use of AI scribe software for clinical note transcription with the patient, who gave verbal consent to proceed.     Review of Systems  Review of Systems  Constitutional:  Negative for malaise/fatigue and weight loss.  Respiratory:  Negative for shortness of breath and wheezing.   Cardiovascular:  Negative for chest pain and leg swelling.  Gastrointestinal:  Negative for abdominal pain, blood in stool, constipation, nausea and vomiting.  Genitourinary:  Negative for dysuria, frequency, hematuria and urgency.  Musculoskeletal:  Negative for joint pain and myalgias.  Neurological:  Negative for weakness.  Psychiatric/Behavioral:  Negative for depression. The patient does not have insomnia.    Current medications, allergy, social history, past surgical history, past medical history, family history were all reviewed.    Vitals: BP 133/68 (BP Location: Right Arm, Patient Position: Sitting)   Pulse 60   Temp 98.2 F (36.8 C) (Oral)   Resp 20   Wt 216 lb 3.2 oz (98.1 kg)   SpO2 100%   BMI 37.83 kg/m     Physical Exam Exam  conducted with a chaperone present.  Constitutional:      General: She is not in acute distress. Cardiovascular:     Rate and Rhythm: Normal rate and regular rhythm.  Pulmonary:     Effort: Pulmonary effort is normal.     Breath sounds: Normal breath sounds. No wheezing or rhonchi.  Abdominal:     Palpations: Abdomen is soft.     Tenderness: There is no abdominal tenderness. There is no right CVA tenderness or left CVA tenderness.     Hernia: No hernia is present.  Genitourinary:    General: Normal vulva.     Urethra: No urethral lesion.     Vagina: No lesions. No bleeding Musculoskeletal:     Cervical back: Neck supple.     Right lower leg: No edema.     Left lower leg: No edema.  Lymphadenopathy:     Upper Body:     Right upper body: No supraclavicular adenopathy.     Left upper body: No supraclavicular adenopathy.     Lower Body: No right inguinal adenopathy. No left inguinal adenopathy.  Skin:    Findings: No rash.  Neurological:     Mental Status: She is oriented to person, place, and time.   Assessment/Plan:  Endometrial cancer (HCC) 74 yo w/recurrent EC Negative symptom review, normal exam.   -Return prn or in 6 mos     [place], MD

## 2024-07-29 NOTE — Patient Instructions (Addendum)
 Plan to follow up in six months or sooner if needed. Please call in late November to get scheduled with Dr.Jackson-Moore in January.   Symptoms to report to your health care team include vaginal bleeding, rectal bleeding, bloating, weight loss without effort, new and persistent pain, new and  persistent fatigue, new leg swelling, new masses (i.e., bumps in your neck or groin), new and persistent cough, new and persistent nausea and vomiting, change in bowel or bladder habits, and any other concerns.

## 2024-08-08 ENCOUNTER — Other Ambulatory Visit: Payer: Self-pay | Admitting: Internal Medicine

## 2024-08-08 DIAGNOSIS — I10 Essential (primary) hypertension: Secondary | ICD-10-CM

## 2024-08-26 ENCOUNTER — Ambulatory Visit

## 2024-08-28 ENCOUNTER — Other Ambulatory Visit: Payer: Self-pay | Admitting: Internal Medicine

## 2024-08-28 DIAGNOSIS — I480 Paroxysmal atrial fibrillation: Secondary | ICD-10-CM

## 2024-08-28 DIAGNOSIS — I10 Essential (primary) hypertension: Secondary | ICD-10-CM

## 2024-08-28 DIAGNOSIS — D6869 Other thrombophilia: Secondary | ICD-10-CM

## 2024-08-28 NOTE — Telephone Encounter (Signed)
 Copied from CRM [id number]. Topic: General - Call Back - No Documentation >> Aug 28, 2024 12:06 PM Merlynn A wrote: Reason for CRM: patient called in returning call from [facility]. Reached out to [facility] and was advised clinical staff is gone to lunch and will return call. Please contact patient back at [phone].

## 2024-08-29 ENCOUNTER — Other Ambulatory Visit: Payer: Self-pay | Admitting: Family

## 2024-08-29 DIAGNOSIS — I480 Paroxysmal atrial fibrillation: Secondary | ICD-10-CM

## 2024-08-29 DIAGNOSIS — I10 Essential (primary) hypertension: Secondary | ICD-10-CM

## 2024-09-01 ENCOUNTER — Ambulatory Visit: Payer: Self-pay

## 2024-09-01 NOTE — Telephone Encounter (Signed)
 I can't really advise based on info. Ok for acute visit if patient is requesting assessment or covid-19 testing.

## 2024-09-01 NOTE — Telephone Encounter (Addendum)
 FYI Only or Action Required?: Action required by provider: Pt is poor historian, limited info gathered. Advised pt call 911 if having chest discomfort/pain/pressure/heaviness lasting longer than 5 min, advised ED if chest discomfort coming more frequently, needs call back with further recommendations with symptoms of last week and now in consideration, as well as her question of coming in for covid test and/or keeping flu shot appt tomorrow.  Patient was last seen in primary care on 05/26/2024 by Joshua Debby CROME, MD.  Called Nurse Triage reporting Headache, Cough, sneezing, medical advice, and prior chest discomfort can't explain.  Symptoms began a week ago.  Interventions attempted: Nothing.  Symptoms are: potentially severe.  Triage Disposition: Go to ED Now (or PCP Triage)  Patient/caregiver understands and will follow disposition?: No, wishes to speak with PCP     Copied from CRM #[id number]. Topic: Clinical - Red Word Triage >> Sep 01, 2024  9:14 AM Tiffini S wrote: [facility] that prompted transfer to Nurse Triage:  mild headaches, coughing, sneezing  Getting flu shot, wondering if should do covid test or any suggestions for her today  Reason for Disposition  Patient sounds very sick or weak to the triager  Answer Assessment - Initial Assessment Questions Pt is poor historian  No not really, sometimes suffer with indigestion or what Wanted to come in to determine whether or not covid  1. LOCATION: Where does it hurt?       Middle of chest, really can't describe it, not having anything come up 2. RADIATION: Does the pain go anywhere else? (e.g., into neck, jaw, arms, back)     Don't think so, don't know 3. ONSET: When did the chest pain begin? (Minutes, hours or days)      Some time last week, was in midst of crowd of people 4. PATTERN: Does the pain come and go, or has it been constant since it started?  Does it get worse with exertion?      Comes and goes, not  worse after activity/exerting self, don't know if worse with eating 5. DURATION: How long does it last (e.g., seconds, minutes, hours)     Not long, a few minutes, sometimes 5 min, sometimes doesn't 6. SEVERITY: How bad is the pain?  (e.g., Scale 1-10; mild, moderate, or severe)     Not sure chest pain or indigestion 3-4/10 7. CARDIAC RISK FACTORS: Do you have any history of heart problems or risk factors for heart disease? (e.g., angina, prior heart attack; diabetes, high blood pressure, high cholesterol, smoker, or strong family history of heart disease)     HTN, a-fib per pt chart, pt states no heart issues, on BP and cholesterol meds, states assuming BP been pretty good 8. PULMONARY RISK FACTORS: Do you have any history of lung disease?  (e.g., blood clots in lung, asthma, emphysema, birth control pills)     no 10. OTHER SYMPTOMS: Do you have any other symptoms? (e.g., dizziness, nausea, vomiting, sweating, fever, difficulty breathing, cough)       No dizziness, nausea, sweating, vomiting, diarrhea, fever, SOB Not really coughing anything up Sore throat  Asked if chest discomfort been more frequent since started, pt responded Only had chest discomfort last week not had since, maybe one time, 1-2x Hx indigestion, think maybe it was that Nurse attempting to clarify if had chest discomfort that lasted longer than 5 min one time and less the other time, pt responded Might not even have been that long Not really pressure,  heaviness No covid test at home she can take  Advised if fever or feeling very poorly, would not want to do flu shot tomorrow Advised that office will let pt know if prefer pt take test at home or okay to come in for testing while taking symptoms of last week and now into consideration   Pt is poor historian, limited info gathered. Advised pt call 911 if having chest discomfort/pain/pressure/heaviness lasting longer than 5 min, advised ED if chest discomfort coming  more frequently, advised that nurse sending message to PCP office for further recommendations about symptoms of last week and now in consideration, as well as her question of coming in for covid test and/or keeping flu shot appt tomorrow.  Protocols used: Chest Pain-A-AH

## 2024-09-01 NOTE — Telephone Encounter (Signed)
 I have not cancel her nurse visit but I did get her scheduled to be seen on Thursday with Dr burns

## 2024-09-02 ENCOUNTER — Ambulatory Visit

## 2024-09-02 DIAGNOSIS — Z23 Encounter for immunization: Secondary | ICD-10-CM

## 2024-09-02 NOTE — Progress Notes (Addendum)
 After obtaining consent, and per orders of Dr. Joshua, injection of Hflu shot  given by Ronnald SHAUNNA Palms. Patient instructed to report any adverse reaction to me immediately.

## 2024-09-02 NOTE — Progress Notes (Unsigned)
 Subjective:    Patient ID: Tina Stone, female    DOB: 1950-09-18, 74 y.o.   MRN: [id number]      HPI Tina Stone is here for No chief complaint on file.        Medications and allergies reviewed with patient and updated if appropriate.  Current Outpatient Medications on File Prior to Visit  Medication Sig Dispense Refill   acetaminophen  (TYLENOL ) 500 MG tablet Take 1,000 mg by mouth every 6 (six) hours as needed for moderate pain.     APPLE CIDER VINEGAR PO Take 5 mLs by mouth as needed.     atenolol  (TENORMIN ) 25 MG tablet TAKE 1 TABLET (25 MG TOTAL) BY MOUTH DAILY. (Patient not taking: Reported on 06/08/2024) 90 tablet 0   atenolol  (TENORMIN ) 25 MG tablet Take 1 tablet (25 mg total) by mouth every evening. 30 tablet 2   Cholecalciferol (VITAMIN D3) 50 MCG (2000 UT) TABS Take by mouth.     ELIQUIS  5 MG TABS tablet TAKE 1 TABLET BY MOUTH TWICE A DAY 60 tablet 2   EPINEPHRINE  0.3 mg/0.3 mL IJ SOAJ injection INJECT 0.3 MG INTO THE MUSCLE AS NEEDED FOR ANAPHYLAXIS. 2 each 1   Evolocumab  (REPATHA  SURECLICK) 140 MG/ML SOAJ Inject 140 mg into the skin every 14 (fourteen) days. 2 mL 11   indapamide  (LOZOL ) 1.25 MG tablet TAKE 1 TABLET BY MOUTH DAILY. 90 tablet 0   levocetirizine (XYZAL ) 5 MG tablet Take 1 tablet (5 mg total) by mouth 2 (two) times daily as needed (hives). 30 tablet 5   levothyroxine  (SYNTHROID ) 75 MCG tablet TAKE 1 TABLET BY MOUTH EVERY DAY IN THE MORNING 90 tablet 1   Multiple Vitamin (MULTIVITAMIN) capsule Take 1 capsule by mouth daily.     NON FORMULARY Herbal Ginger and lemon tea     zinc gluconate 50 MG tablet Take 50 mg by mouth daily.     No current facility-administered medications on file prior to visit.    Review of Systems     Objective:  There were no vitals filed for this visit. BP Readings from Last 3 Encounters:  07/29/24 133/68  06/26/24 (!) 159/76  06/08/24 132/80   Wt Readings from Last 3 Encounters:  07/29/24 216 lb 3.2 oz (98.1 kg)   07/07/24 216 lb (98 kg)  06/26/24 219 lb 6.4 oz (99.5 kg)   There is no height or weight on file to calculate BMI.    Physical Exam Constitutional:      General: She is not in acute distress.    Appearance: Normal appearance. She is not ill-appearing.  HENT:     Head: Normocephalic and atraumatic.     Right Ear: Tympanic membrane, ear canal and external ear normal.     Left Ear: Tympanic membrane, ear canal and external ear normal.     Mouth/Throat:     Mouth: Mucous membranes are moist.     Pharynx: No oropharyngeal exudate or posterior oropharyngeal erythema.  Eyes:     Conjunctiva/sclera: Conjunctivae normal.  Cardiovascular:     Rate and Rhythm: Normal rate and regular rhythm.  Pulmonary:     Effort: Pulmonary effort is normal. No respiratory distress.     Breath sounds: Normal breath sounds. No wheezing or rales.  Musculoskeletal:     Cervical back: Neck supple. No tenderness.  Lymphadenopathy:     Cervical: No cervical adenopathy.  Skin:    General: Skin is warm and dry.  Neurological:  Mental Status: She is alert.            Assessment & Plan:    See Problem List for Assessment and Plan of chronic medical problems.

## 2024-09-02 NOTE — Patient Instructions (Addendum)
       Medications changes include :   None        Return if symptoms worsen or fail to improve.

## 2024-09-03 ENCOUNTER — Encounter: Payer: Self-pay | Admitting: Internal Medicine

## 2024-09-03 ENCOUNTER — Ambulatory Visit (INDEPENDENT_AMBULATORY_CARE_PROVIDER_SITE_OTHER): Admitting: Internal Medicine

## 2024-09-03 VITALS — BP 128/84 | HR 57 | Temp 98.5°F | Ht 63.39 in | Wt 216.2 lb

## 2024-09-03 DIAGNOSIS — J029 Acute pharyngitis, unspecified: Secondary | ICD-10-CM

## 2024-09-03 DIAGNOSIS — J069 Acute upper respiratory infection, unspecified: Secondary | ICD-10-CM | POA: Diagnosis not present

## 2024-09-03 DIAGNOSIS — I10 Essential (primary) hypertension: Secondary | ICD-10-CM

## 2024-09-03 DIAGNOSIS — Z20822 Contact with and (suspected) exposure to covid-19: Secondary | ICD-10-CM | POA: Diagnosis not present

## 2024-09-03 DIAGNOSIS — I480 Paroxysmal atrial fibrillation: Secondary | ICD-10-CM

## 2024-09-03 DIAGNOSIS — D6869 Other thrombophilia: Secondary | ICD-10-CM

## 2024-09-03 DIAGNOSIS — R059 Cough, unspecified: Secondary | ICD-10-CM | POA: Diagnosis not present

## 2024-09-03 LAB — POC COVID19 BINAXNOW: SARS Coronavirus 2 Ag: NEGATIVE

## 2024-09-03 LAB — POCT INFLUENZA A/B
Influenza A, POC: NEGATIVE
Influenza B, POC: NEGATIVE

## 2024-09-03 MED ORDER — APIXABAN 5 MG PO TABS: 5.0000 mg | ORAL_TABLET | Freq: Two times a day (BID) | ORAL | 2 refills | Status: AC

## 2024-09-03 NOTE — Addendum Note (Signed)
 Addended by: Mycah Mcdougall , Lyle Niblett M on: 09/03/2024 10:03 AM   Modules accepted: Orders

## 2024-09-10 ENCOUNTER — Ambulatory Visit
Admission: RE | Admit: 2024-09-10 | Discharge: 2024-09-10 | Disposition: A | Discharge status: Home / Self Care | Source: Ambulatory Visit | Attending: Internal Medicine | Admitting: Internal Medicine

## 2024-09-10 DIAGNOSIS — Z1231 Encounter for screening mammogram for malignant neoplasm of breast: Secondary | ICD-10-CM | POA: Diagnosis not present

## 2024-10-05 DIAGNOSIS — H25813 Combined forms of age-related cataract, bilateral: Secondary | ICD-10-CM | POA: Diagnosis not present

## 2024-10-05 DIAGNOSIS — H40013 Open angle with borderline findings, low risk, bilateral: Secondary | ICD-10-CM | POA: Diagnosis not present

## 2024-10-14 ENCOUNTER — Ambulatory Visit: Payer: Self-pay

## 2024-10-14 ENCOUNTER — Telehealth: Payer: Self-pay | Admitting: Internal Medicine

## 2024-10-14 ENCOUNTER — Other Ambulatory Visit: Payer: Self-pay | Admitting: Internal Medicine

## 2024-10-14 DIAGNOSIS — K219 Gastro-esophageal reflux disease without esophagitis: Secondary | ICD-10-CM

## 2024-10-14 MED ORDER — FAMOTIDINE 40 MG PO TABS: 40.0000 mg | ORAL_TABLET | Freq: Every day | ORAL | 0 refills | Status: DC

## 2024-10-14 NOTE — Telephone Encounter (Signed)
 Medication refill request has been sent to Dr. Yetta Barre

## 2024-10-14 NOTE — Telephone Encounter (Signed)
 FYI Only or Action Required?: Action required by provider: update on patient condition and Requesting famotidine  script for heartburn.  Patient was last seen in primary care on 09/03/2024 by Geofm Glade PARAS, MD.  Called Nurse Triage reporting Heartburn.  Symptoms began daily in the mornings.  Interventions attempted: Rest, hydration, or home remedies.  Symptoms are: gradually worsening.  Triage Disposition: Home Care  Patient/caregiver understands and will follow disposition?: Yes  Copied from CRM [id number]. Topic: Clinical - Medication Question >> Oct 14, 2024  9:44 AM Ashley R wrote: Reason for CRM: Calling to request the antacid that was previously prescribed to her to be added again. Experiencing issues with GERD when eating acidic foods, ginger lemon tea, etc. Would have been sent a few years back and does not remember what the medication was.  ----------------------------------------------------------------------- From previous Reason for Contact - Medication Refill: Medication:   Has the patient contacted their pharmacy?   (Agent: If no, request that the patient contact the pharmacy for the refill. If patient does not wish to contact the pharmacy document the reason why and proceed with request.) (Agent: If yes, when and what did the pharmacy advise?)  This is the patient's preferred pharmacy:  [phone], [place] - [address] [place] [address] Phone: [phone] Fax: [phone]   Is this the correct pharmacy for this prescription? Yes  If no, delete pharmacy and type the correct one.   Has the prescription been filled recently? No   Is the patient out of the medication? Yes   Has the patient been seen for an appointment in the last year OR does the patient have an upcoming appointment?    Can we respond through MyChart?    Agent: Please be advised that Rx refills may take up to 3 business days. We ask  that you follow-up with your pharmacy. Reason for Disposition  [1] Abdominal pain is intermittent AND [2] shoots into chest, with sour taste in mouth AND [3] symptoms same as previously diagnosed reflux and not tried antacids  Answer Assessment - Initial Assessment Questions For the last few months patient has tried a new morning regimen  that involves a lot of acidic teas and foods. Patient remembers that she was on a heartburn medication previously that started with an F. Past med list reviewed and famotidine  infusion was given for awhile in 2021. She agrees it is this medication.  Patient denies CP, SOB, dizziness, abdominal pain, or referred pain to her neck or shoulders. She describes the heartburn as burning in her esophagus. Worse when she bends over, and relief with burping and Tums. Tums only helps short term so far.   1. LOCATION: Where does it hurt?      Burning through her esophagus 2. RADIATION: Does the pain shoot anywhere else? (e.g., chest, back)     denies 3. ONSET: When did the pain begin? (e.g., minutes, hours or days ago)      In the mornings when she eats or drinks acidic food and teas 4. SUDDEN: Gradual or sudden onset?     Shortly after ingesting acidic foods 5. PATTERN Does the pain come and go, or is it constant?     Can take a tums and resolve 6. SEVERITY: How bad is the pain?  (e.g., Scale 1-10; mild, moderate, or severe)    mild 7. RECURRENT SYMPTOM: Have you ever had this type of stomach pain before? If Yes, ask: When was the  last time? and What happened that time?      Yes- consistent with her normal acid reflux 8. AGGRAVATING FACTORS: Does anything seem to cause this pain? (e.g., foods, stress, alcohol)     Acidic foods and drinks 9. CARDIAC SYMPTOMS: Do you have any of the following symptoms: chest pain, difficulty breathing, sweating, nausea?     Denies  10. OTHER SYMPTOMS: Do you have any other symptoms? (e.g., back pain, diarrhea,  fever, urination pain, vomiting)       Denies all  Protocols used: Abdominal Pain - Upper-A-AH

## 2024-10-14 NOTE — Telephone Encounter (Signed)
 Please see triage encounter- patient requesting famotidine  for heartburn. Pharmacy confirmed

## 2024-10-14 NOTE — Telephone Encounter (Signed)
**Note De-identified  [place] Obfuscation** Please advise 

## 2024-10-21 ENCOUNTER — Ambulatory Visit: Payer: Self-pay

## 2024-10-21 NOTE — Telephone Encounter (Signed)
 FYI Only or Action Required?: FYI only for provider.  Patient was last seen in primary care on 09/03/2024 by Geofm Glade PARAS, MD.  Called Nurse Triage reporting Sore Throat and Fever.  Symptoms began today.  Interventions attempted: OTC medications: tylenol .  Symptoms are: gradually improving.  Triage Disposition: Home Care  Patient/caregiver understands and will follow disposition?: Yes  Message from [facility] sent at 10/21/2024  3:49 PM EDT  Summary: Fever   Reason for Triage: Patient states she thinks she has a fever feeling really hot and sore throat and wants to know if she can take tylenol .  [place] [phone]         Reason for Disposition  [1] Sore throat is the only symptom AND [2] sore throat present < 48 hours  Answer Assessment - Initial Assessment Questions Patient calling to confirm that it is safe to take tylenol    1. ONSET: When did the throat start hurting? (Hours or days ago)      This morning 2. SEVERITY: How bad is the sore throat? (Scale 1-10; mild, moderate or severe)     mild 3. STREP EXPOSURE: Has there been any exposure to strep within the past week? If Yes, ask: What type of contact occurred?      unknown 4.  VIRAL SYMPTOMS: Are there any symptoms of a cold, such as a runny nose, cough, hoarse voice or red eyes?      Sore throat 5. FEVER: Do you have a fever? If Yes, ask: What is your temperature, how was it measured, and when did it start?     Has felt feverish 6. PUS ON THE TONSILS: Is there pus on the tonsils in the back of your throat?     Not assessed 7. OTHER SYMPTOMS: Do you have any other symptoms? (e.g., difficulty breathing, headache, rash)     denies 8. PREGNANCY: Is there any chance you are pregnant? When was your last menstrual period?     N/a  Protocols used: Sore Throat-A-AH

## 2024-10-22 ENCOUNTER — Other Ambulatory Visit: Payer: Self-pay

## 2024-10-22 ENCOUNTER — Encounter (HOSPITAL_COMMUNITY): Payer: Self-pay

## 2024-10-22 ENCOUNTER — Emergency Department (HOSPITAL_COMMUNITY)
Admission: EM | Admit: 2024-10-22 | Discharge: 2024-10-22 | Disposition: A | Discharge status: Home / Self Care | Attending: Emergency Medicine | Admitting: Emergency Medicine

## 2024-10-22 DIAGNOSIS — K219 Gastro-esophageal reflux disease without esophagitis: Secondary | ICD-10-CM | POA: Insufficient documentation

## 2024-10-22 DIAGNOSIS — Z7901 Long term (current) use of anticoagulants: Secondary | ICD-10-CM | POA: Diagnosis not present

## 2024-10-22 DIAGNOSIS — I480 Paroxysmal atrial fibrillation: Secondary | ICD-10-CM | POA: Diagnosis not present

## 2024-10-22 DIAGNOSIS — I10 Essential (primary) hypertension: Secondary | ICD-10-CM | POA: Diagnosis not present

## 2024-10-22 DIAGNOSIS — R002 Palpitations: Secondary | ICD-10-CM | POA: Diagnosis present

## 2024-10-22 LAB — CBC WITH DIFFERENTIAL/PLATELET
Abs Immature Granulocytes: 0.02 K/uL (ref 0.00–0.07)
Basophils Absolute: 0 K/uL (ref 0.0–0.1)
Basophils Relative: 1 %
Eosinophils Absolute: 0.1 K/uL (ref 0.0–0.5)
Eosinophils Relative: 3 %
HCT: 39.8 % (ref 36.0–46.0)
Hemoglobin: 12.8 g/dL (ref 12.0–15.0)
Immature Granulocytes: 1 %
Lymphocytes Relative: 32 %
Lymphs Abs: 1.1 K/uL (ref 0.7–4.0)
MCH: 24.5 pg — ABNORMAL LOW (ref 26.0–34.0)
MCHC: 32.2 g/dL (ref 30.0–36.0)
MCV: 76.1 fL — ABNORMAL LOW (ref 80.0–100.0)
Monocytes Absolute: 0.3 K/uL (ref 0.1–1.0)
Monocytes Relative: 8 %
Neutro Abs: 2 K/uL (ref 1.7–7.7)
Neutrophils Relative %: 55 %
Platelets: 234 K/uL (ref 150–400)
RBC: 5.23 MIL/uL — ABNORMAL HIGH (ref 3.87–5.11)
RDW: 15.9 % — ABNORMAL HIGH (ref 11.5–15.5)
WBC: 3.6 K/uL — ABNORMAL LOW (ref 4.0–10.5)
nRBC: 0 % (ref 0.0–0.2)

## 2024-10-22 LAB — COMPREHENSIVE METABOLIC PANEL WITH GFR
ALT: 13 U/L (ref 0–44)
AST: 19 U/L (ref 15–41)
Albumin: 3.3 g/dL — ABNORMAL LOW (ref 3.5–5.0)
Alkaline Phosphatase: 81 U/L (ref 38–126)
Anion gap: 9 (ref 5–15)
BUN: 9 mg/dL (ref 8–23)
CO2: 25 mmol/L (ref 22–32)
Calcium: 9 mg/dL (ref 8.9–10.3)
Chloride: 104 mmol/L (ref 98–111)
Creatinine, Ser: 0.99 mg/dL (ref 0.44–1.00)
GFR, Estimated: >60 mL/min (ref >60)
Glucose, Bld: 102 mg/dL — ABNORMAL HIGH (ref 70–99)
Potassium: 3.9 mmol/L (ref 3.5–5.1)
Sodium: 138 mmol/L (ref 135–145)
Total Bilirubin: 0.7 mg/dL (ref 0.0–1.2)
Total Protein: 7 g/dL (ref 6.5–8.1)

## 2024-10-22 MED ORDER — LIDOCAINE VISCOUS HCL 2 % MT SOLN
15.0000 mL | Freq: Once | OROMUCOSAL | Status: AC
  Administered 2024-10-22: 15 mL via ORAL
  Filled 2024-10-22: qty 15

## 2024-10-22 MED ORDER — OMEPRAZOLE 40 MG PO CPDR: 40.0000 mg | DELAYED_RELEASE_CAPSULE | Freq: Every day | ORAL | 0 refills | Status: DC

## 2024-10-22 MED ORDER — ALUM & MAG HYDROXIDE-SIMETH 200-200-20 MG/5ML PO SUSP
30.0000 mL | Freq: Once | ORAL | Status: AC
  Administered 2024-10-22: 30 mL via ORAL
  Filled 2024-10-22: qty 30

## 2024-10-22 NOTE — ED Triage Notes (Addendum)
 Pt POV d/t heart palpitations at home and thought she may be having an allergic reaction to her Pepcid  .  She has taken 3 doses since Tuesday and took at 2100 tonight. All symptoms have subsided at this time.

## 2024-10-22 NOTE — ED Provider Notes (Signed)
 [place] EMERGENCY DEPARTMENT AT [facility] Provider Note   CSN: [id number] Arrival date & time: 10/22/24  0140     Patient presents with: Palpitations   Tina Stone is a 74 y.o. female presents today for heart palpitations and concerned she may be having allergic reaction to Pepcid  or still having GERD symptoms.  Patient has taken 3 doses since Tuesday and took another at 2100 tonight.  Patient denies any symptoms at this time.  Patient states this feels similar to her previous GERD symptoms.  Patient denies chest pain, shortness of breath, fever, chills, nausea, vomiting, any other complaints at this time.    Palpitations      Prior to Admission medications   Medication Sig Start Date End Date Taking? Authorizing Provider  omeprazole  (PRILOSEC) 40 MG capsule Take 1 capsule (40 mg total) by mouth daily. 10/22/24  Yes Mykle Pascua N, PA-C  acetaminophen  (TYLENOL ) 500 MG tablet Take 1,000 mg by mouth every 6 (six) hours as needed for moderate pain.    [provider]  apixaban  (ELIQUIS ) 5 MG TABS tablet Take 1 tablet (5 mg total) by mouth 2 (two) times daily. 09/03/24   Geofm Glade PARAS, MD  APPLE CIDER VINEGAR PO Take 5 mLs by mouth as needed.    [provider]  atenolol  (TENORMIN ) 25 MG tablet Take 1 tablet (25 mg total) by mouth every evening. 06/05/24   Lucius Krabbe, NP  Cholecalciferol (VITAMIN D3) 50 MCG (2000 UT) TABS Take by mouth.    [provider]  EPINEPHRINE  0.3 mg/0.3 mL IJ SOAJ injection INJECT 0.3 MG INTO THE MUSCLE AS NEEDED FOR ANAPHYLAXIS. 08/28/24   Tobie Arleta SQUIBB, MD  Evolocumab  (REPATHA  SURECLICK) 140 MG/ML SOAJ Inject 140 mg into the skin every 14 (fourteen) days. 01/17/24   Hilty, Vinie BROCKS, MD  indapamide  (LOZOL ) 1.25 MG tablet TAKE 1 TABLET BY MOUTH DAILY. Patient not taking: Reported on 09/03/2024 08/10/24   Joshua Debby CROME, MD  levocetirizine (XYZAL ) 5 MG tablet Take 1 tablet (5 mg total) by mouth 2 (two) times  daily as needed (hives). Patient not taking: Reported on 09/03/2024 06/08/24   Marinda Rocky SAILOR, MD  levothyroxine  (SYNTHROID ) 75 MCG tablet TAKE 1 TABLET BY MOUTH EVERY DAY IN THE MORNING 05/11/24   Joshua Debby CROME, MD  Multiple Vitamin (MULTIVITAMIN) capsule Take 1 capsule by mouth daily.    [provider]  NON FORMULARY Herbal Ginger and lemon tea    [provider]  zinc gluconate 50 MG tablet Take 50 mg by mouth daily.    [provider]    Allergies: Benicar  hct [olmesartan  medoxomil-hctz], Iodinated contrast media, Crestor  [rosuvastatin ], Bactrim  [sulfamethoxazole -trimethoprim ], Pravachol  [pravastatin ], Shellfish allergy, and Amlodipine     Review of Systems  Cardiovascular:  Positive for palpitations.    Updated Vital Signs BP 135/76   Pulse (!) 55   Temp 97.7 F (36.5 C) (Oral)   Resp 15   Ht 5' 4 (1.626 m)   Wt 97.1 kg   SpO2 100%   BMI 36.73 kg/m   Physical Exam Vitals and nursing note reviewed.  Constitutional:      General: She is not in acute distress.    Appearance: Normal appearance. She is well-developed.  HENT:     Head: Normocephalic and atraumatic.     Right Ear: External ear normal.     Left Ear: External ear normal.     Nose: Nose normal.     Mouth/Throat:  Mouth: Mucous membranes are moist. No angioedema.     Tongue: Tongue does not deviate from midline.     Pharynx: Oropharynx is clear. Uvula midline. No uvula swelling.     Tonsils: No tonsillar abscesses.  Eyes:     Conjunctiva/sclera: Conjunctivae normal.  Cardiovascular:     Rate and Rhythm: Normal rate and regular rhythm.     Heart sounds: No murmur heard. Pulmonary:     Effort: Pulmonary effort is normal. No respiratory distress.     Breath sounds: Normal breath sounds.  Abdominal:     Palpations: Abdomen is soft.     Tenderness: There is no abdominal tenderness.  Musculoskeletal:        General: No swelling.     Cervical back: Neck supple.  Skin:    General:  Skin is warm and dry.     Capillary Refill: Capillary refill takes less than 2 seconds.  Neurological:     Mental Status: She is alert.  Psychiatric:        Mood and Affect: Mood normal.     (all labs ordered are listed, but only abnormal results are displayed) Labs Reviewed  COMPREHENSIVE METABOLIC PANEL WITH GFR - Abnormal; Notable for the following components:      Result Value   Glucose, Bld 102 (*)    Albumin 3.3 (*)    All other components within normal limits  CBC WITH DIFFERENTIAL/PLATELET - Abnormal; Notable for the following components:   WBC 3.6 (*)    RBC 5.23 (*)    MCV 76.1 (*)    MCH 24.5 (*)    RDW 15.9 (*)    All other components within normal limits    EKG: EKG Interpretation Date/Time:  Thursday October 22 2024 05:35:30 EDT Ventricular Rate:  61 PR Interval:  182 QRS Duration:  91 QT Interval:  423 QTC Calculation: 427 R Axis:   65  Text Interpretation: Sinus rhythm Low voltage, precordial leads No significant change was found Confirmed by Trine Likes [phone]) on 10/22/2024 5:45:31 AM  Radiology: No results found.   Procedures   Medications Ordered in the ED  alum & mag hydroxide-simeth (MAALOX/MYLANTA) 200-200-20 MG/5ML suspension 30 mL (has no administration in time range)    And  lidocaine  (XYLOCAINE ) 2 % viscous mouth solution 15 mL (has no administration in time range)                                    Medical Decision Making Amount and/or Complexity of Data Reviewed Labs: ordered.   This patient presents to the ED for concern of palpitations, this involves an extensive number of treatment options, and is a complaint that carries with it a high risk of complications and morbidity.  The differential diagnosis includes GERD, arrhythmia, electrolyte abnormality, anemia   Co morbidities / Chronic conditions that complicate the patient evaluation  GERD, paroxysmal A-fib   Lab Tests:  I Ordered, and personally interpreted labs.   The pertinent results include: CMP unremarkable, leukopenia 3.6 which is chronic per historical values   Cardiac Monitoring: / EKG:  The patient was maintained on a cardiac monitor.  I personally viewed and interpreted the cardiac monitored which showed an underlying rhythm of: sinus rhythm   Problem List / ED Course / Critical interventions / Medication management  I ordered medication including GI cocktail I have reviewed the patients home medicines and have made adjustments  as needed  Test / Admission - Considered:  Consider for admission or further workup however patient's vital signs, physical exam, and labs are reassuring.  Patient symptoms likely due to GERD.  Patient given GI cocktail prior to discharge.  Patient advised to stop taking Pepcid  and begin taking Prilosec.  Patient to follow-up with primary care and/or cardiology if her symptoms persist for further evaluation and workup.  Patient given return precautions.  I feel patient safer discharge at this time.     Final diagnoses:  Gastroesophageal reflux disease, unspecified whether esophagitis present    ED Discharge Orders          Ordered    omeprazole  (PRILOSEC) 40 MG capsule  Daily        10/22/24 0548               Francis Ileana SAILOR, PA-C 10/22/24 0549    Trine Raynell Moder, MD 10/22/24 [phone]

## 2024-10-22 NOTE — Progress Notes (Deleted)
   Acute Office Visit  Subjective:     Patient ID: Tina Stone, female    DOB: [birth year], 74 y.o.   MRN: [id number]  No chief complaint on file.   HPI  Discussed the use of AI scribe software for clinical note transcription with the patient, who gave verbal consent to proceed.  ED f/u from [facility] on 10/22/24 for palpitations after taking pepcid . History of Present Illness      ROS Per HPI      Objective:    There were no vitals taken for this visit.   Physical Exam Vitals and nursing note reviewed.  Constitutional:      General: She is not in acute distress.    Appearance: Normal appearance. She is normal weight.  HENT:     Head: Normocephalic and atraumatic.     Right Ear: External ear normal.     Left Ear: External ear normal.     Nose: Nose normal.     Mouth/Throat:     Mouth: Mucous membranes are moist.     Pharynx: Oropharynx is clear.  Eyes:     Extraocular Movements: Extraocular movements intact.     Pupils: Pupils are equal, round, and reactive to light.  Cardiovascular:     Rate and Rhythm: Normal rate and regular rhythm.     Pulses: Normal pulses.     Heart sounds: Normal heart sounds.  Pulmonary:     Effort: Pulmonary effort is normal. No respiratory distress.     Breath sounds: Normal breath sounds. No wheezing, rhonchi or rales.  Musculoskeletal:        General: Normal range of motion.     Cervical back: Normal range of motion.     Right lower leg: No edema.     Left lower leg: No edema.  Lymphadenopathy:     Cervical: No cervical adenopathy.  Neurological:     General: No focal deficit present.     Mental Status: She is alert and oriented to person, place, and time.  Psychiatric:        Mood and Affect: Mood normal.        Thought Content: Thought content normal.     No results found for any visits on 10/23/24.      Assessment & Plan:   Assessment and Plan Assessment & Plan      No orders of the defined types were  placed in this encounter.    No orders of the defined types were placed in this encounter.   No follow-ups on file.  Corean LITTIE Ku, FNP

## 2024-10-22 NOTE — Discharge Instructions (Addendum)
 Today you were seen for GERD.  Please stop taking your Pepcid  and begin taking Prilosec.  Please follow-up with your primary care and cardiologist if your symptoms persist for further evaluation and workup.  Thank you for letting us  treat you today. After reviewing your labs, I feel you are safe to go home. Please follow up with your PCP in the next several days and provide them with your records from this visit. Return to the Emergency Room if pain becomes severe or symptoms worsen.

## 2024-10-23 ENCOUNTER — Encounter: Payer: Self-pay | Admitting: Family Medicine

## 2024-10-23 ENCOUNTER — Telehealth: Payer: Self-pay | Admitting: Internal Medicine

## 2024-10-23 ENCOUNTER — Ambulatory Visit: Attending: Internal Medicine | Admitting: Internal Medicine

## 2024-10-23 ENCOUNTER — Inpatient Hospital Stay: Admitting: Family Medicine

## 2024-10-23 ENCOUNTER — Encounter: Payer: Self-pay | Admitting: Internal Medicine

## 2024-10-23 ENCOUNTER — Ambulatory Visit (INDEPENDENT_AMBULATORY_CARE_PROVIDER_SITE_OTHER): Admitting: Family Medicine

## 2024-10-23 VITALS — BP 142/76 | HR 63 | Ht 64.0 in | Wt 221.3 lb

## 2024-10-23 VITALS — BP 136/78 | HR 60 | Temp 97.8°F | Ht 64.0 in | Wt 221.0 lb

## 2024-10-23 DIAGNOSIS — T466X5D Adverse effect of antihyperlipidemic and antiarteriosclerotic drugs, subsequent encounter: Secondary | ICD-10-CM

## 2024-10-23 DIAGNOSIS — K219 Gastro-esophageal reflux disease without esophagitis: Secondary | ICD-10-CM

## 2024-10-23 DIAGNOSIS — I480 Paroxysmal atrial fibrillation: Secondary | ICD-10-CM

## 2024-10-23 DIAGNOSIS — E785 Hyperlipidemia, unspecified: Secondary | ICD-10-CM

## 2024-10-23 DIAGNOSIS — E66813 Obesity, class 3: Secondary | ICD-10-CM | POA: Diagnosis not present

## 2024-10-23 DIAGNOSIS — M791 Myalgia, unspecified site: Secondary | ICD-10-CM | POA: Diagnosis not present

## 2024-10-23 MED ORDER — OMEPRAZOLE 20 MG PO CPDR: 20.0000 mg | DELAYED_RELEASE_CAPSULE | Freq: Two times a day (BID) | ORAL | 1 refills | Status: DC

## 2024-10-23 NOTE — Progress Notes (Signed)
 LIPID CLINIC CONSULT NOTE  Chief Complaint:  Follow-up dyslipidemia  Primary Care Physician: Joshua Debby CROME, MD  Primary Cardiologist:  Vinie JAYSON Maxcy, MD  HPI:  Tina Stone is a 74 y.o. female who is being seen today for the evaluation of dyslipidemia at the request of Joshua Debby CROME, MD. this is a pleasant 74 year old female kindly referred for evaluation management of dyslipidemia.  She has a history of atrial fibrillation, hypertension and dyslipidemia with aortic atherosclerosis.  Unfortunately she has been intolerant to statins.  Most recently she was tried on rosuvastatin  but has previously had muscle aches on atorvastatin  and pravastatin .  She then was seen by our lipid clinic pharmacist who started her on Repatha  back in August.  She believes she has had at least 3 injections prior to repeat lab work in October which showed total cholesterol 219, HDL 55, triglycerides 84 and LDL 125.  The LDL had come down from 167, but this would be considered a suboptimal response, much less than the expected 50 to 65%.  There may be several reasons for this.  A change in diet or decrease in activity that was concomitant with starting the medicine might explain it.  Ms. administration of the medication could be an issue but it does not sound like she is having issues with that.  The other possibilities are mutations a PCSK9 or perhaps high LP(a) which seems to be a more common issue.  She also asked about Ozempic today and was interested in a prescription for it.  03/18/2023  Tina Stone returns today for follow-up of her lipids.  She has had further improvement in her cholesterol on Repatha .  Her LDL particle number is now 1118 with an LDL-C of 106, triglycerides 82 and HDL of 65.  Her LP(a) was assessed and is elevated 167 nmol/L.  She seems to be tolerating the Repatha  quite well.  No side effects as noted with her statins in the past.  Her primary cardiologist Dr. Claudene is retired.  I am  happy to follow-up with her regarding her lipids and A-fib is a general cardiologist.  05/15/2024  Tina Stone is seen today in follow-up.  She was seen last August by Kathyrn Lawrence, NP.  At the time they discussed weight loss and there was a plan to refer her to healthy weight and wellness however that never occurred.  She is interested in GLP-1 medications however does not have diabetes and may have difficulty getting insurance coverage for that.  She does have hypertension and obesity with a BMI over 40 as well as aortic atherosclerosis and atrial fibrillation.  Perhaps she might qualify for [facility].  She denies any symptomatic atrial fibrillation.  EKG shows sinus bradycardia today.  Her cholesterol remains above target with LDL 108.  Goal is less than 70.  She remains on Repatha  which she is tolerating well.  10/23/2024  Tina Stone is seen today in follow-up.  Overall she feels well denies chest pain or shortness of breath.  She was seen in the hospital yesterday however for what was described as palpitations however it may have been reflux.  She was taking famotidine  and noted palpitations that were associated with that and they seem to subside after stopping it.  Although it is possible for famotidine  be associated with arrhythmias it is quite rare.  I wonder if she had some paroxysmal A-fib however EKG in the ER showed normal sinus rhythm.  She plans to see her PCP for  better reflux therapy.  She remains on Repatha  and had been working on weight loss to try to help her lipids however unfortunately her cholesterol has gone up with total cholesterol now 228 and LDL 135.  Unfortunately Medicare does not cover the GLP-1 medications.    PMHx:  Past Medical History:  Diagnosis Date   Adenomatous colon polyp    Allergic rhinitis, seasonal    Allergy    Atrial fibrillation (HCC)    Beta thalassemia trait 11/25/2013   Cholelithiasis    Class 3 obesity without serious comorbidity with body mass index  (BMI) of 40.0 to 44.9 in adult 11/19/2012   DDD (degenerative disc disease), lumbar    endometrial ca dx'd 08/2009   endometrial    GERD (gastroesophageal reflux disease)    H. pylori infection    History of radiation therapy    endometrial - 02/19/2022-03/23/2022 Dr Lynwood Nasuti   HLD (hyperlipidemia)    Hypercholesterolemia    Hypertension 03/18/2017   no meds    Hypothyroidism    Intraductal papilloma of left breast    Patient underwent left needle-localized lumpectomy by Dr. Donnice POUR. Tsuei on 09/09/2013; pathology showed intraductal papilloma with no atypia or malignancy identified.   Morbid (severe) obesity due to excess calories (HCC) 09/27/2023   bmi 40.38   PONV (postoperative nausea and vomiting)    Pre-diabetes    pt denies   Sciatica    Spinal stenosis    Urticaria    Uterine fibroid     Past Surgical History:  Procedure Laterality Date   ANKLE FRACTURE SURGERY Right 1966   right   BREAST EXCISIONAL BIOPSY Left 2014   benign   BREAST LUMPECTOMY WITH NEEDLE LOCALIZATION Left 09/09/2013   Procedure: BREAST LUMPECTOMY WITH NEEDLE LOCALIZATION;  Surgeon: Donnice POUR. Belinda, MD;  Location: [facility] OR;  Service: General;  Laterality: Left;   CHOLECYSTECTOMY     COLONOSCOPY     IR IMAGING GUIDED PORT INSERTION  05/18/2020   IR REMOVAL TUN ACCESS W/ PORT W/O FL MOD SED  01/02/2023   POLYPECTOMY     ROBOTIC ASSISTED LAPAROSCOPIC CHOLECYSTECTOMY  09/09/2019   ROBOTIC ASSISTED TOTAL HYSTERECTOMY WITH BILATERAL SALPINGO OOPHERECTOMY N/A 10/22/2019   Procedure: XI ROBOTIC ASSISTED TOTAL HYSTERECTOMY WITH BILATERAL SALPINGO OOPHORECTOMY GREATER THAN 250 GRAMS, MINI LAPARTOMY FOR SPECIMEN DELIVERY; PELVIC AND PERI-AORTIC LYMPHADENECTOMY;  Surgeon: Eloy Herring, MD;  Location: [facility] ORS;  Service: Gynecology;  Laterality: N/A;   SENTINEL NODE BIOPSY N/A 10/22/2019   Procedure: SENTINEL NODE BIOPSY;  Surgeon: Eloy Herring, MD;  Location: [facility];  Service: Gynecology;  Laterality: N/A;     FAMHx:  Family History  Problem Relation Age of Onset   Diabetes Mother    Hypertension Mother    Colon polyps Mother 34   Dementia Mother 61   Diabetes Father    Congestive Heart Failure Father    Asthma Brother    Diabetes Brother    Hypertension Brother    Hypertension Brother    Hypertension Maternal Grandmother    Diabetes Maternal Grandmother    Asthma Paternal Grandmother    Lung disease Paternal Grandmother    Diabetes Paternal Grandfather    Pancreatic cancer Paternal Aunt    Uterine cancer Paternal Aunt    Colon cancer Neg Hx    Breast cancer Neg Hx    Lung cancer Neg Hx    Esophageal cancer Neg Hx    Rectal cancer Neg Hx    Stomach cancer Neg Hx  SOCHx:   reports that she has never smoked. She has never used smokeless tobacco. She reports that she does not drink alcohol and does not use drugs.  ALLERGIES:  Allergies  Allergen Reactions   Benicar  Hct [Olmesartan  Medoxomil-Hctz] Shortness Of Breath and Palpitations   Iodinated Contrast Media Hives and Itching    02/02/2021-  pt developed 2 hives and itching even with the 13hr premedication.  Radiology PA came into eval the pt.  Developed itching and hives after injection on 05/10/20; needs 13hr prep in future   Crestor  [Rosuvastatin ] Other (See Comments)    Muscle aches   Bactrim  [Sulfamethoxazole -Trimethoprim ] Other (See Comments)    Abdominal pain, dizziness   Famotidine  Other (See Comments)    Reaction: Sore Throat   Pravachol  [Pravastatin ] Other (See Comments)    Lower abdominal pain   Shellfish Allergy Nausea And Vomiting   Amlodipine  Palpitations    ROS: Pertinent items noted in HPI and remainder of comprehensive ROS otherwise negative.  HOME MEDS: Current Outpatient Medications on File Prior to Visit  Medication Sig Dispense Refill   acetaminophen  (TYLENOL ) 500 MG tablet Take 1,000 mg by mouth every 6 (six) hours as needed for moderate pain.     apixaban  (ELIQUIS ) 5 MG TABS tablet Take 1  tablet (5 mg total) by mouth 2 (two) times daily. 60 tablet 2   APPLE CIDER VINEGAR PO Take 5 mLs by mouth as needed.     atenolol  (TENORMIN ) 25 MG tablet Take 1 tablet (25 mg total) by mouth every evening. 30 tablet 2   Cholecalciferol (VITAMIN D3) 50 MCG (2000 UT) TABS Take by mouth.     EPINEPHRINE  0.3 mg/0.3 mL IJ SOAJ injection INJECT 0.3 MG INTO THE MUSCLE AS NEEDED FOR ANAPHYLAXIS. 2 each 1   Evolocumab  (REPATHA  SURECLICK) 140 MG/ML SOAJ Inject 140 mg into the skin every 14 (fourteen) days. 2 mL 11   levothyroxine  (SYNTHROID ) 75 MCG tablet TAKE 1 TABLET BY MOUTH EVERY DAY IN THE MORNING 90 tablet 1   Multiple Vitamin (MULTIVITAMIN) capsule Take 1 capsule by mouth daily.     NON FORMULARY Herbal Ginger and lemon tea     omeprazole  (PRILOSEC) 40 MG capsule Take 1 capsule (40 mg total) by mouth daily. 14 capsule 0   zinc gluconate 50 MG tablet Take 50 mg by mouth daily.     indapamide  (LOZOL ) 1.25 MG tablet TAKE 1 TABLET BY MOUTH DAILY. (Patient not taking: Reported on 10/23/2024) 90 tablet 0   levocetirizine (XYZAL ) 5 MG tablet Take 1 tablet (5 mg total) by mouth 2 (two) times daily as needed (hives). (Patient not taking: Reported on 10/23/2024) 30 tablet 5   No current facility-administered medications on file prior to visit.    LABS/IMAGING: Results for orders placed or performed during the hospital encounter of 10/22/24 (from the past 48 hours)  Comprehensive metabolic panel     Status: Abnormal   Collection Time: 10/22/24  2:13 AM  Result Value Ref Range   Sodium 138 135 - 145 mmol/L   Potassium 3.9 3.5 - 5.1 mmol/L   Chloride 104 98 - 111 mmol/L   CO2 25 22 - 32 mmol/L   Glucose, Bld 102 (H) 70 - 99 mg/dL    Comment: Glucose reference range applies only to samples taken after fasting for at least 8 hours.   BUN 9 8 - 23 mg/dL   Creatinine, Ser 9.00 0.44 - 1.00 mg/dL   Calcium  9.0 8.9 - 10.3 mg/dL  Total Protein 7.0 6.5 - 8.1 g/dL   Albumin 3.3 (L) 3.5 - 5.0 g/dL   AST 19  15 - 41 U/L   ALT 13 0 - 44 U/L   Alkaline Phosphatase 81 38 - 126 U/L   Total Bilirubin 0.7 0.0 - 1.2 mg/dL   GFR, Estimated >39 >39 mL/min    Comment: (NOTE) Calculated using the CKD-EPI Creatinine Equation (2021)    Anion gap 9 5 - 15    Comment: Performed at [facility], [address] [place], [place] [address]  CBC with Differential     Status: Abnormal   Collection Time: 10/22/24  2:13 AM  Result Value Ref Range   WBC 3.6 (L) 4.0 - 10.5 K/uL   RBC 5.23 (H) 3.87 - 5.11 MIL/uL   Hemoglobin 12.8 12.0 - 15.0 g/dL   HCT 60.1 63.9 - 53.9 %   MCV 76.1 (L) 80.0 - 100.0 fL   MCH 24.5 (L) 26.0 - 34.0 pg   MCHC 32.2 30.0 - 36.0 g/dL   RDW 84.0 (H) 88.4 - 84.4 %   Platelets 234 150 - 400 K/uL   nRBC 0.0 0.0 - 0.2 %   Neutrophils Relative % 55 %   Neutro Abs 2.0 1.7 - 7.7 K/uL   Lymphocytes Relative 32 %   Lymphs Abs 1.1 0.7 - 4.0 K/uL   Monocytes Relative 8 %   Monocytes Absolute 0.3 0.1 - 1.0 K/uL   Eosinophils Relative 3 %   Eosinophils Absolute 0.1 0.0 - 0.5 K/uL   Basophils Relative 1 %   Basophils Absolute 0.0 0.0 - 0.1 K/uL   Immature Granulocytes 1 %   Abs Immature Granulocytes 0.02 0.00 - 0.07 K/uL    Comment: Performed at [facility], [address] [place], [place] [address]   *Note: Due to a large number of results and/or encounters for the requested time period, some results have not been displayed. A complete set of results can be found in Results Review.    No results found.  LIPID PANEL:    Component Value Date/Time   CHOL 228 (H) 05/26/2024 0921   CHOL 201 (H) 08/16/2023 1159   TRIG 56.0 05/26/2024 0921   HDL 81.80 05/26/2024 0921   HDL 79 08/16/2023 1159   CHOLHDL 3 05/26/2024 0921   VLDL 11.2 05/26/2024 0921   LDLCALC 135 (H) 05/26/2024 0921   LDLCALC 112 (H) 08/16/2023 1159    WEIGHTS: Wt Readings from Last 3 Encounters:  10/23/24 221 lb 4.8 oz (100.4 kg)  10/22/24 214 lb (97.1 kg)  09/03/24 216 lb 3.2 oz (98.1 kg)     VITALS: BP (!) 142/76   Pulse 63   Ht 5' 4 (1.626 m)   Wt 221 lb 4.8 oz (100.4 kg)   SpO2 96%   BMI 37.99 kg/m   EXAM: General appearance: alert and no distress Lungs: clear to auscultation bilaterally Heart: regular rate and rhythm, S1, S2 normal, no murmur, click, rub or gallop Extremities: extremities normal, atraumatic, no cyanosis or edema Neurologic: Grossly normal  EKG: ER EKG personally reviewed from 10/22/2024-sinus rhythm  ASSESSMENT: Mixed dyslipidemia, goal LDL less than 70 Statin intolerant-myalgias Aortic atherosclerosis Hypertension PAF Morbid obesity, BMI>40  PLAN: 1.   Tina Stone was seen in the ER yesterday for was described as palpitations however might have been reflux.  It is also possible she had some paroxysmal A-fib although she feels like it was related to famotidine .  She has  now switched off of that medicine.  I have advised her to consider a Kardia mobile device to use if she feels ongoing palpitations at home to try to capture her EKG as this may be A-fib.  Cholesterol still remains elevated.  She needs at least 50% reduction in her lipids which I think is unlikely to happen with just diet however she like to work another 6 months on this and repeat lipids at that time.  Plan follow-up with repeat lipids in 6 months or sooner as necessary.  Vinie KYM Maxcy, MD, [facility], FNLA, FACP  [place]  [facility]  Medical Director of the Advanced Lipid Disorders &  Cardiovascular Risk Reduction Clinic Diplomate of the American Board of Clinical Lipidology Attending Cardiologist  Direct Dial: [phone]  Fax: [phone]  Website:  [email or web address]   Vinie BROCKS Alain Deschene 10/23/2024, 10:15 AM

## 2024-10-23 NOTE — Patient Instructions (Addendum)
 STOP famotidine .  START omeprazole  20mg  once daily just before dinner.   If you need more in a week, may take a second dose before breakfast.  Follow-up with me for new or worsening symptoms.

## 2024-10-23 NOTE — Patient Instructions (Addendum)
 Medication Instructions:  NO CHANGES  *If you need a refill on your cardiac medications before your next appointment, please call your pharmacy*  Lab Work: FASTING lab work in 6 months to check cholesterol   If you have labs (blood work) drawn today and your tests are completely normal, you will receive your results only by: MyChart Message (if you have MyChart) OR A paper copy in the mail If you have any lab test that is abnormal or we need to change your treatment, we will call you to review the results.  Follow-Up: At [facility], you and your health needs are our priority.  As part of our continuing mission to provide you with exceptional heart care, our providers are all part of one team.  This team includes your primary Cardiologist (physician) and Advanced Practice Providers or APPs (Physician Assistants and Nurse Practitioners) who all work together to provide you with the care you need, when you need it.  Your next appointment:    6 months with Dr. Mona  We recommend signing up for the patient portal called MyChart.  Sign up information is provided on this After Visit Summary.  MyChart is used to connect with patients for Virtual Visits (Telemedicine).  Patients are able to view lab/test results, encounter notes, upcoming appointments, etc.  Non-urgent messages can be sent to your provider as well.   To learn more about what you can do with MyChart, go to [email or web address].   Other Instructions

## 2024-10-23 NOTE — Progress Notes (Signed)
 Acute Office Visit  Subjective:     Patient ID: Tina Stone, female    DOB: 24-Nov-1950, 74 y.o.   MRN: [id number]  Chief Complaint  Patient presents with   Hospitalization Follow-up    HPI  Discussed the use of AI scribe software for clinical note transcription with the patient, who gave verbal consent to proceed.  History of Present Illness Tina Stone is a 74 year old female with atrial fibrillation who presents with concerns about medication side effects.  Palpitations - Experiences heart palpitations described as 'flutters of the heart' associated with famotidine  use - Initial two doses of famotidine  40 mg were well-tolerated; subsequent doses led to palpitations and discomfort - No current palpitations  Oropharyngeal symptoms - Developed a severe sore throat after starting famotidine , which resolved quickly after discontinuation - No current sore throat  Medication intolerance - Expresses a general dislike for taking medications - Has not picked up any new medications from the pharmacy  Recent laboratory and infectious disease testing - No recent laboratory work performed by cardiologist - Recent COVID and influenza tests were normal     ROS Per HPI      Objective:    BP 136/78 (BP Location: Left Arm, Patient Position: Sitting)   Pulse 60   Temp 97.8 F (36.6 C) (Temporal)   Ht 5' 4 (1.626 m)   Wt 221 lb (100.2 kg)   SpO2 97%   BMI 37.93 kg/m    Physical Exam Vitals and nursing note reviewed.  Constitutional:      General: She is not in acute distress.    Appearance: Normal appearance. She is normal weight.  HENT:     Head: Normocephalic and atraumatic.     Right Ear: External ear normal.     Left Ear: External ear normal.     Nose: Nose normal.     Mouth/Throat:     Mouth: Mucous membranes are moist.     Pharynx: Oropharynx is clear.  Eyes:     Extraocular Movements: Extraocular movements intact.     Pupils: Pupils are  equal, round, and reactive to light.  Cardiovascular:     Rate and Rhythm: Normal rate and regular rhythm.     Pulses: Normal pulses.     Heart sounds: Normal heart sounds.  Pulmonary:     Effort: Pulmonary effort is normal. No respiratory distress.     Breath sounds: Normal breath sounds. No wheezing, rhonchi or rales.  Musculoskeletal:        General: Normal range of motion.     Cervical back: Normal range of motion.     Right lower leg: No edema.     Left lower leg: No edema.  Lymphadenopathy:     Cervical: No cervical adenopathy.  Neurological:     General: No focal deficit present.     Mental Status: She is alert and oriented to person, place, and time.  Psychiatric:        Mood and Affect: Mood normal.        Thought Content: Thought content normal.     No results found for any visits on 10/23/24.      Assessment & Plan:   Assessment and Plan Assessment & Plan Gastroesophageal Reflux Disease (GERD) Symptoms resolved after stopping famotidine . Prefers lower dose of omeprazole  Stone to side effect concerns. Agreed to start low and increase if needed. - Prescribe lower dose of Prilosec (omeprazole ) once daily before dinner. - Increase to  twice daily if symptoms persist after one week.  Paroxysmal Atrial Fibrillation (AFib) AFib well-managed. Palpitations possibly related to famotidine  resolved after discontinuation. Treat as allergy. - Avoid famotidine  Stone to potential side effects and treat as an allergy.  General Health Maintenance Recent COVID and flu tests normal.  Follow-up Advised to follow up as needed and encouraged to reach out for any further questions or concerns. - Encourage to contact the office via text, call, or MyChart for any further questions or concerns.     No orders of the defined types were placed in this encounter.    Meds ordered this encounter  Medications   omeprazole  (PRILOSEC) 20 MG capsule    Sig: Take 1 capsule (20 mg total) by  mouth 2 (two) times daily before a meal.    Dispense:  60 capsule    Refill:  1    Return if symptoms worsen or fail to improve.  Corean LITTIE Ku, FNP

## 2024-10-23 NOTE — Telephone Encounter (Signed)
 PAS did not notate which medication is not at the pharmacy. Left VM for patient requesting call back.  Copied from CRM [id number]. Topic: Clinical - Prescription Issue >> Oct 23, 2024 12:22 PM [name] wrote: Reason for CRM: patient called because the pharmacy said they didn't have the perscription

## 2024-10-28 ENCOUNTER — Telehealth: Payer: Self-pay

## 2024-10-28 ENCOUNTER — Ambulatory Visit: Payer: Self-pay

## 2024-10-28 NOTE — Transitions of Care (Post Inpatient/ED Visit) (Signed)
   10/28/2024  Name: Tina Stone MRN: [id number] DOB: 04/29/1950  Today's TOC FU Call Status: Today's TOC FU Call Status:: Successful TOC FU Call Completed TOC FU Call Complete Date: 10/28/24 Patient's Name and Date of Birth confirmed.  Transition Care Management Follow-up Telephone Call Date of Discharge: 10/22/24 Discharge Facility: [facility]) Type of Discharge: Emergency Department Reason for ED Visit: Other: (Palpitations, GERD) How have you been since you were released from the hospital?: Better Any questions or concerns?: No  Items Reviewed: Did you receive and understand the discharge instructions provided?: Yes Medications obtained,verified, and reconciled?: Yes (Medications Reviewed) Any new allergies since your discharge?: No Dietary orders reviewed?: No Do you have support at home?: Yes People in Home [RPT]: alone, sibling(s) Name of Support/Comfort Primary Source: Avelina Hawks (sister)  Medications Reviewed Today: Medications Reviewed Today   Medications were not reviewed in this encounter     Home Care and Equipment/Supplies: Were Home Health Services Ordered?: No Any new equipment or medical supplies ordered?: No  Functional Questionnaire: Do you need assistance with bathing/showering or dressing?: No Do you need assistance with meal preparation?: No Do you need assistance with eating?: No Do you have difficulty maintaining continence: No Do you need assistance with getting out of bed/getting out of a chair/moving?: No Do you have difficulty managing or taking your medications?: No  Follow up appointments reviewed: PCP Follow-up appointment confirmed?: No (Patient does not need follow up appt) MD Provider Line Number:[phone] Given: No Specialist Hospital Follow-up appointment confirmed?: NA Do you need transportation to your follow-up appointment?: No Do you understand care options if your condition(s) worsen?: Yes-patient verbalized  understanding    SIGNATURE: Edsel Kerns, CMA

## 2024-10-28 NOTE — Telephone Encounter (Signed)
 FYI Only or Action Required?: FYI only for provider: appointment scheduled on 10/29/2024.  Patient was last seen in primary care on 10/23/2024 by Alvia Corean CROME, FNP.  Called Nurse Triage reporting Cough.  Symptoms began 2-3 days ago.  Interventions attempted: OTC medications: cough drops and Rest, hydration, or home remedies.  Symptoms are: unchanged.  Triage Disposition: See PCP When Office is Open (Within 3 Days)  Patient/caregiver understands and will follow disposition?: Yes  Patient reports dry persistent cough along with runny nose for the last 2-3 days. Reports symptoms started with the weather change. Patient scheduled for 10/29/2024 at 9 AM    Copied from CRM #[id number]. Topic: Clinical - Medical Advice >> Oct 28, 2024  9:05 AM Alexandria E wrote: Reason for CRM: Patient has had a cough for the past 2-3 days, stating that it is not worsening, just a consistent dry cough. Patient is questioning if PCP would be able to prescribe some cough medicine without coming into the office for a visit. Please call patient and advise if this is doable. Reason for Disposition  [1] Also has allergy symptoms (e.g., itchy eyes, clear nasal discharge, postnasal drip) AND [2] they are acting up  Answer Assessment - Initial Assessment Questions 1. ONSET: When did the cough begin?      Started 2-3 days ago 2. SEVERITY: How bad is the cough today?      Mild during the day. Worse at night 3. SPUTUM: Describe the color of your sputum (e.g., none, dry cough; clear, white, yellow, green)     Dry mostly but occasionally coughing up sputum 4. HEMOPTYSIS: Are you coughing up any blood? If Yes, ask: How much? (e.g., flecks, streaks, tablespoons, etc.)     no 5. DIFFICULTY BREATHING: Are you having difficulty breathing? If Yes, ask: How bad is it? (e.g., mild, moderate, severe)      No difficulty breathing 6. FEVER: Do you have a fever? If Yes, ask: What is your temperature, how  was it measured, and when did it start?     no 7. CARDIAC HISTORY: Do you have any history of heart disease? (e.g., heart attack, congestive heart failure)      no 8. LUNG HISTORY: Do you have any history of lung disease?  (e.g., pulmonary embolus, asthma, emphysema)     no 9. PE RISK FACTORS: Do you have a history of blood clots? (or: recent major surgery, recent prolonged travel, bedridden)     no 10. OTHER SYMPTOMS: Do you have any other symptoms? (e.g., runny nose, wheezing, chest pain)       Runny nose, some allergy like symptoms.  12. TRAVEL: Have you traveled out of the country in the last month? (e.g., travel history, exposures)       no  Protocols used: Cough - Acute Non-Productive-A-AH

## 2024-10-29 ENCOUNTER — Ambulatory Visit: Admitting: Emergency Medicine

## 2024-10-29 ENCOUNTER — Encounter: Payer: Self-pay | Admitting: Emergency Medicine

## 2024-10-29 VITALS — BP 126/84 | HR 66 | Temp 98.2°F | Ht 64.0 in | Wt 217.0 lb

## 2024-10-29 DIAGNOSIS — J069 Acute upper respiratory infection, unspecified: Secondary | ICD-10-CM | POA: Diagnosis not present

## 2024-10-29 DIAGNOSIS — R051 Acute cough: Secondary | ICD-10-CM

## 2024-10-29 MED ORDER — BENZONATATE 200 MG PO CAPS: 200.0000 mg | ORAL_CAPSULE | Freq: Two times a day (BID) | ORAL | 0 refills | Status: DC | PRN

## 2024-10-29 NOTE — Patient Instructions (Signed)

## 2024-10-29 NOTE — Assessment & Plan Note (Signed)
Stable and running its course without complications Symptom management discussed Advised to rest and stay well-hydrated Tylenol and or Advil as needed No concerns identified.

## 2024-10-29 NOTE — Assessment & Plan Note (Signed)
 Cough management discussed. Continue over-the-counter Mucinex  DM as needed along with cough drops and good hydration. Tessalon  200 mg 3 times daily as needed NyQuil at bedtime recommended

## 2024-10-29 NOTE — Progress Notes (Signed)
 Tina Stone 74 y.o.   Chief Complaint  Patient presents with   Cough    Pt has has this cough for the past 3-4 days and it is just lingering only thing over the counter she has tried is cough drops     HISTORY OF PRESENT ILLNESS: This is a 74 y.o. female complaining of flulike symptoms and cough that started about 4 days ago.  Slowly getting better but cough still present Dry cough.  No fever or chills.  No difficulty breathing or chest pain.  No other associated symptoms. No other complaints or medical concerns today.  Cough Associated symptoms include a sore throat. Pertinent negatives include no chest pain, chills, fever, headaches, rash, shortness of breath or wheezing.     Prior to Admission medications   Medication Sig Start Date End Date Taking? Authorizing Provider  benzonatate  (TESSALON ) 200 MG capsule Take 1 capsule (200 mg total) by mouth 2 (two) times daily as needed for cough. 10/29/24  Yes Chrissy Ealey, Emil Schanz, MD  acetaminophen  (TYLENOL ) 500 MG tablet Take 1,000 mg by mouth every 6 (six) hours as needed for moderate pain.    [provider]  apixaban  (ELIQUIS ) 5 MG TABS tablet Take 1 tablet (5 mg total) by mouth 2 (two) times daily. 09/03/24   Geofm Glade PARAS, MD  APPLE CIDER VINEGAR PO Take 5 mLs by mouth as needed.    [provider]  atenolol  (TENORMIN ) 25 MG tablet Take 1 tablet (25 mg total) by mouth every evening. 06/05/24   Lucius Krabbe, NP  Cholecalciferol (VITAMIN D3) 50 MCG (2000 UT) TABS Take by mouth.    [provider]  EPINEPHRINE  0.3 mg/0.3 mL IJ SOAJ injection INJECT 0.3 MG INTO THE MUSCLE AS NEEDED FOR ANAPHYLAXIS. 08/28/24   Tobie Arleta SQUIBB, MD  Evolocumab  (REPATHA  SURECLICK) 140 MG/ML SOAJ Inject 140 mg into the skin every 14 (fourteen) days. 01/17/24   Hilty, Vinie BROCKS, MD  indapamide  (LOZOL ) 1.25 MG tablet TAKE 1 TABLET BY MOUTH DAILY. Patient not taking: Reported on 10/23/2024 08/10/24   Joshua Debby CROME, MD   levocetirizine (XYZAL ) 5 MG tablet Take 1 tablet (5 mg total) by mouth 2 (two) times daily as needed (hives). Patient not taking: Reported on 10/23/2024 06/08/24   Marinda Rocky SAILOR, MD  levothyroxine  (SYNTHROID ) 75 MCG tablet TAKE 1 TABLET BY MOUTH EVERY DAY IN THE MORNING 05/11/24   Joshua Debby CROME, MD  Multiple Vitamin (MULTIVITAMIN) capsule Take 1 capsule by mouth daily.    [provider]  NON FORMULARY Herbal Ginger and lemon tea    [provider]  omeprazole  (PRILOSEC) 20 MG capsule Take 1 capsule (20 mg total) by mouth 2 (two) times daily before a meal. 10/23/24   Alvia Krabbe CROME, FNP  zinc gluconate 50 MG tablet Take 50 mg by mouth daily.    [provider]    Allergies  Allergen Reactions   Benicar  Hct [Olmesartan  Medoxomil-Hctz] Shortness Of Breath and Palpitations   Iodinated Contrast Media Hives and Itching    02/02/2021-  pt developed 2 hives and itching even with the 13hr premedication.  Radiology PA came into eval the pt.  Developed itching and hives after injection on 05/10/20; needs 13hr prep in future   Crestor  [Rosuvastatin ] Other (See Comments)    Muscle aches   Bactrim  [Sulfamethoxazole -Trimethoprim ] Other (See Comments)    Abdominal pain, dizziness   Famotidine  Other (See Comments)    Reaction: Sore Throat   Pravachol  [Pravastatin ]  Other (See Comments)    Lower abdominal pain   Shellfish Allergy Nausea And Vomiting   Amlodipine  Palpitations    Patient Active Problem List   Diagnosis Date Noted   History of colonic polyps 09/13/2023   Left lumbar radiculopathy 08/19/2023   Polyp of colon 01/09/2023   Benign liver cyst 12/21/2022   Aortic atherosclerosis 08/01/2022   Chronic idiopathic urticaria 11/22/2021   Chronic anticoagulation 10/02/2021   Visit for screening mammogram 08/18/2021   Acquired hypothyroidism 08/18/2021   Encounter for general adult medical examination with abnormal findings 08/18/2021   Hyperlipidemia with  target LDL less than 100 08/17/2021   Laryngitis, chronic 08/17/2021   Paroxysmal atrial fibrillation (HCC) 02/03/2021   Secondary hypercoagulable state 02/03/2021   Essential hypertension 11/16/2020   Vitamin D  deficiency 10/27/2020   Endometrial cancer (HCC) 10/22/2019   Beta thalassemia trait 11/25/2013   Class II obesity 11/19/2012   Gastroesophageal reflux disease 01/28/2007    Past Medical History:  Diagnosis Date   Adenomatous colon polyp    Allergic rhinitis, seasonal    Allergy    Atrial fibrillation (HCC)    Beta thalassemia trait 11/25/2013   Cholelithiasis    Class 3 obesity without serious comorbidity with body mass index (BMI) of 40.0 to 44.9 in adult 11/19/2012   DDD (degenerative disc disease), lumbar    endometrial ca dx'd 08/2009   endometrial    GERD (gastroesophageal reflux disease)    H. pylori infection    History of radiation therapy    endometrial - 02/19/2022-03/23/2022 Dr Lynwood Nasuti   HLD (hyperlipidemia)    Hypercholesterolemia    Hypertension 03/18/2017   no meds    Hypothyroidism    Intraductal papilloma of left breast    Patient underwent left needle-localized lumpectomy by Dr. Donnice POUR. Tsuei on 09/09/2013; pathology showed intraductal papilloma with no atypia or malignancy identified.   Morbid (severe) obesity due to excess calories (HCC) 09/27/2023   bmi 40.38   PONV (postoperative nausea and vomiting)    Pre-diabetes    pt denies   Sciatica    Spinal stenosis    Urticaria    Uterine fibroid     Past Surgical History:  Procedure Laterality Date   ANKLE FRACTURE SURGERY Right 1966   right   BREAST EXCISIONAL BIOPSY Left 2014   benign   BREAST LUMPECTOMY WITH NEEDLE LOCALIZATION Left 09/09/2013   Procedure: BREAST LUMPECTOMY WITH NEEDLE LOCALIZATION;  Surgeon: Donnice POUR. Belinda, MD;  Location: [facility];  Service: General;  Laterality: Left;   CHOLECYSTECTOMY     COLONOSCOPY     IR IMAGING GUIDED PORT INSERTION  05/18/2020   IR REMOVAL  TUN ACCESS W/ PORT W/O FL MOD SED  01/02/2023   POLYPECTOMY     ROBOTIC ASSISTED LAPAROSCOPIC CHOLECYSTECTOMY  09/09/2019   ROBOTIC ASSISTED TOTAL HYSTERECTOMY WITH BILATERAL SALPINGO OOPHERECTOMY N/A 10/22/2019   Procedure: XI ROBOTIC ASSISTED TOTAL HYSTERECTOMY WITH BILATERAL SALPINGO OOPHORECTOMY GREATER THAN 250 GRAMS, MINI LAPARTOMY FOR SPECIMEN DELIVERY; PELVIC AND PERI-AORTIC LYMPHADENECTOMY;  Surgeon: Eloy Herring, MD;  Location: [facility];  Service: Gynecology;  Laterality: N/A;   SENTINEL NODE BIOPSY N/A 10/22/2019   Procedure: SENTINEL NODE BIOPSY;  Surgeon: Eloy Herring, MD;  Location: [facility];  Service: Gynecology;  Laterality: N/A;    Social History   Socioeconomic History   Marital status: Widowed    Spouse name: Not on file   Number of children: 0   Years of education: Not on file   Highest  education level: Bachelor's degree (e.g., BA, AB, BS)  Occupational History   Occupation: Geographical Information Systems Officer  Tobacco Use   Smoking status: Never   Smokeless tobacco: Never   Tobacco comments:    few puffs but not a true smoker quit many yrs ago  Vaping Use   Vaping status: Never Used  Substance and Sexual Activity   Alcohol use: Never   Drug use: Never   Sexual activity: Not Currently  Other Topics Concern   Not on file  Social History Narrative   ** Merged History Encounter **       Lives in [place], widowed 2003   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         Social Drivers of Health   Financial Resource Strain: Low Risk  (05/20/2024)   Overall Financial Resource Strain (CARDIA)    Difficulty of Paying Living Expenses: Not very hard  Food Insecurity: Food Insecurity Present (05/20/2024)   Hunger Vital Sign    Worried About Running Out of Food in the Last Year: Never true    Ran Out of Food in the Last Year: Sometimes true  Transportation Needs: No Transportation Needs (05/20/2024)   PRAPARE - Administrator, Civil Service (Medical): No    Lack of Transportation  (Non-Medical): No  Physical Activity: Sufficiently Active (05/20/2024)   Exercise Vital Sign    Days of Exercise per Week: 3 days    Minutes of Exercise per Session: 90 min  Stress: No Stress Concern Present (05/20/2024)   [employer] of Occupational Health - Occupational Stress Questionnaire    Feeling of Stress : Not at all  Social Connections: Moderately Integrated (05/20/2024)   Social Connection and Isolation Panel    Frequency of Communication with Friends and Family: More than three times a week    Frequency of Social Gatherings with Friends and Family: More than three times a week    Attends Religious Services: More than 4 times per year    Active Member of [employer] or Organizations: Yes    Attends Banker Meetings: More than 4 times per year    Marital Status: Widowed  Intimate Partner Violence: Not At Risk (05/20/2024)   Humiliation, Afraid, Rape, and Kick questionnaire    Fear of Current or Ex-Partner: No    Emotionally Abused: No    Physically Abused: No    Sexually Abused: No    Family History  Problem Relation Age of Onset   Diabetes Mother    Hypertension Mother    Colon polyps Mother 63   Dementia Mother 54   Diabetes Father    Congestive Heart Failure Father    Asthma Brother    Diabetes Brother    Hypertension Brother    Hypertension Brother    Hypertension Maternal Grandmother    Diabetes Maternal Grandmother    Asthma Paternal Grandmother    Lung disease Paternal Grandmother    Diabetes Paternal Grandfather    Pancreatic cancer Paternal Aunt    Uterine cancer Paternal Aunt    Colon cancer Neg Hx    Breast cancer Neg Hx    Lung cancer Neg Hx    Esophageal cancer Neg Hx    Rectal cancer Neg Hx    Stomach cancer Neg Hx      Review of Systems  Constitutional: Negative.  Negative for chills and fever.  HENT:  Positive for congestion and sore throat.   Respiratory:  Positive for cough. Negative for  shortness of breath and wheezing.    Cardiovascular: Negative.  Negative for chest pain and palpitations.  Gastrointestinal:  Negative for abdominal pain, diarrhea, nausea and vomiting.  Genitourinary: Negative.  Negative for dysuria and hematuria.  Skin: Negative.  Negative for rash.  Neurological: Negative.  Negative for dizziness and headaches.    Vitals:   10/29/24 0857  BP: 126/84  Pulse: 66  Temp: 98.2 F (36.8 C)  SpO2: 98%    Physical Exam Vitals reviewed.  Constitutional:      Appearance: Normal appearance.  HENT:     Head: Normocephalic.     Mouth/Throat:     Mouth: Mucous membranes are moist.     Pharynx: Oropharynx is clear.  Eyes:     Extraocular Movements: Extraocular movements intact.     Pupils: Pupils are equal, round, and reactive to light.  Cardiovascular:     Rate and Rhythm: Normal rate and regular rhythm.     Pulses: Normal pulses.     Heart sounds: Normal heart sounds.  Pulmonary:     Effort: Pulmonary effort is normal.     Breath sounds: Normal breath sounds.  Musculoskeletal:     Cervical back: No tenderness.  Lymphadenopathy:     Cervical: No cervical adenopathy.  Skin:    General: Skin is warm and dry.     Capillary Refill: Capillary refill takes less than 2 seconds.  Neurological:     General: No focal deficit present.     Mental Status: She is alert and oriented to person, place, and time.  Psychiatric:        Mood and Affect: Mood normal.        Behavior: Behavior normal.      ASSESSMENT & PLAN: Problem List Items Addressed This Visit       Respiratory   Upper respiratory infection, viral - Primary   Stable and running its course without complications Symptom management discussed Advised to rest and stay well-hydrated Tylenol  and or Advil  as needed No concerns identified.        Other   Acute cough   Cough management discussed. Continue over-the-counter Mucinex  DM as needed along with cough drops and good hydration. Tessalon  200 mg 3 times daily as  needed NyQuil at bedtime recommended      Relevant Medications   benzonatate  (TESSALON ) 200 MG capsule   Patient Instructions  Cough, Adult A cough helps to clear your throat and lungs. It may be a sign of an illness or another condition. A short-term (acute) cough may last 2-3 weeks. A long-term (chronic) cough may last 8 or more weeks. Many things can cause a cough. They include: Illnesses such as: An infection in your throat or lungs. Asthma or other heart or lung problems. Gastroesophageal reflux. This is when acid comes back up from your stomach. Breathing in things that bother (irritate) your lungs. Allergies. Postnasal drip. This is when mucus runs down the back of your throat. Smoking. Some medicines. Follow these instructions at home: Medicines Take over-the-counter and prescription medicines only as told by your doctor. Talk with your doctor before you take cough medicine (cough suppressants). Eating and drinking Do not drink alcohol. Do not drink caffeine. Drink enough fluid to keep your pee (urine) pale yellow. Lifestyle Stay away from cigarette smoke. Do not smoke or use any products that contain nicotine or tobacco. If you need help quitting, ask your doctor. Stay away from things that make you cough. These may include perfume, candles,  cleaning products, or campfire smoke. General instructions  Watch for any changes to your cough. Tell your doctor about them. Always cover your mouth when you cough. If the air is dry in your home, use a cool mist vaporizer or humidifier. If your cough is worse at night, try using extra pillows to raise your head up higher while you sleep. Rest as needed. Contact a doctor if: You have new symptoms. Your symptoms get worse. You cough up pus. You have a fever that does not go away. Your cough does not get better after 2-3 weeks. Cough medicine does not help, and you are not sleeping well. You have pain that gets worse or is  not helped with medicine. You are losing weight and do not know why. You have night sweats. Get help right away if: You cough up blood. You have trouble breathing. Your heart is beating very fast. These symptoms may be an emergency. Get help right away. Call 911. Do not wait to see if the symptoms will go away. Do not drive yourself to the hospital. This information is not intended to replace advice given to you by your health care provider. Make sure you discuss any questions you have with your health care provider. Document Revised: 08/17/2022 Document Reviewed: 08/17/2022 Elsevier Patient Education  2024 Elsevier Inc.      Emil Schaumann, MD [facility]

## 2024-10-30 NOTE — Telephone Encounter (Signed)
 I see where medication was sent to the pharmacy for the patient on 10/24 and then again on yesterday. I was unable to reach the patient but I left a very detailed message in regards to identifying which medication she needed.

## 2024-11-05 NOTE — Telephone Encounter (Signed)
 Patient has received her medication

## 2024-11-10 ENCOUNTER — Telehealth: Payer: Self-pay

## 2024-11-10 NOTE — Telephone Encounter (Signed)
**Note De-identified  [place] Obfuscation** Please advise 

## 2024-11-10 NOTE — Telephone Encounter (Signed)
 Copied from CRM [id number]. Topic: Clinical - Medical Advice >> Nov 10, 2024 11:19 AM Burnard DEL wrote: Reason for CRM: Patient called in stating that she has been experiencing some pain around the area where she had a porta cath placed 3 years ago when she had cancer? She would like advice as to if this is possibly normal,and if this could just e because of scar tissue? Please advise.

## 2024-11-11 ENCOUNTER — Ambulatory Visit: Payer: Self-pay

## 2024-11-11 NOTE — Telephone Encounter (Signed)
 FYI Only or Action Required?: FYI only for provider: appointment scheduled on 11/12/2024.  Patient was last seen in primary care on 10/29/2024 by Purcell Emil Schanz, MD.  Called Nurse Triage reporting chest tenderness.  Symptoms began several days ago.  Interventions attempted: Nothing.  Symptoms are: completely resolved.  Triage Disposition: See PCP When Office is Open (Within 3 Days)  Patient/caregiver understands and will follow disposition?: Yes  Copied from CRM [id number]. Topic: Clinical - Red Word Triage >> Nov 11, 2024  3:34 PM Victoria A wrote: [facility] that prompted transfer to Nurse Triage: Patient is having discomfort around the port area started last Friday Reason for Disposition  Nursing judgment or information in reference  Answer Assessment - Initial Assessment Questions 1. REASON FOR CALL: What is your main concern right now?     Site of old port tender to touch x 2 days 2. ONSET: When did the tenderness start?     5 days ago 3. SEVERITY: How bad is the pain?     Mild discomfort 4. FUNCTIONAL IMPAIRMENT: How have things been going for you overall? Have you had more difficulty than usual doing your normal daily activities? (e.g., self-care, school, work, interactions)     N/a 5. RELIEVING AND AGGRAVATING FACTORS: What makes it better or worse? (e.g., certain activities, rest)     N/a 6. FEVER: Do you have a fever?     denies 7. OTHER SYMPTOMS: Do you have any other new symptoms?     No redness or swelling 8. TREATMENTS AND RESPONSE: What have you done so far to try to make this better? What medicines have you used?     Pain has resolved, only had soreness for a couple of days, patient thinks that it may have been related to scar tissue.  Port removed more than four years ago  Protocols used: No Guideline Available-A-AH

## 2024-11-11 NOTE — Telephone Encounter (Signed)
OV for tomorrow

## 2024-11-12 ENCOUNTER — Ambulatory Visit: Admitting: Internal Medicine

## 2024-11-12 ENCOUNTER — Ambulatory Visit

## 2024-11-12 ENCOUNTER — Ambulatory Visit: Payer: Self-pay | Admitting: Internal Medicine

## 2024-11-12 ENCOUNTER — Encounter: Payer: Self-pay | Admitting: Internal Medicine

## 2024-11-12 VITALS — BP 136/82 | HR 65 | Temp 98.3°F | Ht 64.0 in | Wt 218.0 lb

## 2024-11-12 DIAGNOSIS — E559 Vitamin D deficiency, unspecified: Secondary | ICD-10-CM | POA: Diagnosis not present

## 2024-11-12 DIAGNOSIS — R0789 Other chest pain: Secondary | ICD-10-CM

## 2024-11-12 DIAGNOSIS — R739 Hyperglycemia, unspecified: Secondary | ICD-10-CM | POA: Diagnosis not present

## 2024-11-12 DIAGNOSIS — I10 Essential (primary) hypertension: Secondary | ICD-10-CM | POA: Diagnosis not present

## 2024-11-12 DIAGNOSIS — R079 Chest pain, unspecified: Secondary | ICD-10-CM | POA: Insufficient documentation

## 2024-11-12 LAB — POCT GLYCOSYLATED HEMOGLOBIN (HGB A1C): Hemoglobin A1C: 5.4 % (ref 4.0–5.6)

## 2024-11-12 NOTE — Assessment & Plan Note (Signed)
 Lab Results  Component Value Date   HGBA1C 5.4 11/12/2024   Stable, pt to continue current medical treatment  - diet, wt control

## 2024-11-12 NOTE — Assessment & Plan Note (Signed)
 BP Readings from Last 3 Encounters:  11/12/24 136/82  10/29/24 126/84  10/23/24 136/78   Stable, pt to continue medical treatment tenormin  25 mg every day, lozol  1.25 mg qd

## 2024-11-12 NOTE — Assessment & Plan Note (Signed)
Last vitamin D Lab Results  Component Value Date   VD25OH 42.24 04/09/2022   Stable, cont oral replacement  

## 2024-11-12 NOTE — Patient Instructions (Signed)
 Your A1c was done in the office today  Please continue all other medications as before, and refills have been done if requested.  Please have the pharmacy call with any other refills you may need.  Please keep your appointments with your specialists as you may have planned  Please go to the XRAY Department in the first floor for the x-ray testing  You will be contacted by phone if any changes need to be made immediately.  Otherwise, you will receive a letter about your results with an explanation, but please check with MyChart first.

## 2024-11-12 NOTE — Assessment & Plan Note (Signed)
 Atypical, etiology unclear but very low suspicion for cardiac, likely MSK it seems but will have CXR to confirm, ok for tylenol  prn

## 2024-11-12 NOTE — Progress Notes (Signed)
 Patient ID: Tina Stone, female   DOB: 1950-09-16, 74 y.o.   MRN: [id number]        Chief Complaint: follow up chest pain, htn, hyperglycemia, low vit d       HPI:  Tina Stone is a 74 y.o. female here with c/o soreness to touch at the right upper chest without swelling, fever, cough, chills, and Pt denies diaphoresis increased sob or doe, wheezing, orthopnea, PND, increased LE swelling, palpitations, dizziness or syncope.  Has been mild to mod for one wk, maybe somewhat improved today for some reason.  Pt is s/p port removal x 4 yrs.   Pt denies polydipsia, polyuria, or new focal neuro s/s.   No recurrence endometrial ca for over 4 yrs  Pt requests A1c in office today Wt Readings from Last 3 Encounters:  11/12/24 218 lb (98.9 kg)  10/29/24 217 lb (98.4 kg)  10/23/24 221 lb (100.2 kg)   BP Readings from Last 3 Encounters:  11/12/24 136/82  10/29/24 126/84  10/23/24 136/78         Past Medical History:  Diagnosis Date   Adenomatous colon polyp    Allergic rhinitis, seasonal    Allergy    Atrial fibrillation (HCC)    Beta thalassemia trait 11/25/2013   Cholelithiasis    Class 3 obesity without serious comorbidity with body mass index (BMI) of 40.0 to 44.9 in adult 11/19/2012   DDD (degenerative disc disease), lumbar    endometrial ca dx'd 08/2009   endometrial    GERD (gastroesophageal reflux disease)    H. pylori infection    History of radiation therapy    endometrial - 02/19/2022-03/23/2022 Dr Lynwood Nasuti   HLD (hyperlipidemia)    Hypercholesterolemia    Hypertension 03/18/2017   no meds    Hypothyroidism    Intraductal papilloma of left breast    Patient underwent left needle-localized lumpectomy by Dr. Donnice POUR. Tsuei on 09/09/2013; pathology showed intraductal papilloma with no atypia or malignancy identified.   Morbid (severe) obesity due to excess calories (HCC) 09/27/2023   bmi 40.38   PONV (postoperative nausea and vomiting)    Pre-diabetes    pt denies    Sciatica    Spinal stenosis    Urticaria    Uterine fibroid    Past Surgical History:  Procedure Laterality Date   ANKLE FRACTURE SURGERY Right 1966   right   BREAST EXCISIONAL BIOPSY Left 2014   benign   BREAST LUMPECTOMY WITH NEEDLE LOCALIZATION Left 09/09/2013   Procedure: BREAST LUMPECTOMY WITH NEEDLE LOCALIZATION;  Surgeon: Donnice POUR. Belinda, MD;  Location: [facility];  Service: General;  Laterality: Left;   CHOLECYSTECTOMY     COLONOSCOPY     IR IMAGING GUIDED PORT INSERTION  05/18/2020   IR REMOVAL TUN ACCESS W/ PORT W/O FL MOD SED  01/02/2023   POLYPECTOMY     ROBOTIC ASSISTED LAPAROSCOPIC CHOLECYSTECTOMY  09/09/2019   ROBOTIC ASSISTED TOTAL HYSTERECTOMY WITH BILATERAL SALPINGO OOPHERECTOMY N/A 10/22/2019   Procedure: XI ROBOTIC ASSISTED TOTAL HYSTERECTOMY WITH BILATERAL SALPINGO OOPHORECTOMY GREATER THAN 250 GRAMS, MINI LAPARTOMY FOR SPECIMEN DELIVERY; PELVIC AND PERI-AORTIC LYMPHADENECTOMY;  Surgeon: Eloy Herring, MD;  Location: [facility];  Service: Gynecology;  Laterality: N/A;   SENTINEL NODE BIOPSY N/A 10/22/2019   Procedure: SENTINEL NODE BIOPSY;  Surgeon: Eloy Herring, MD;  Location: [facility];  Service: Gynecology;  Laterality: N/A;    reports that she has never smoked. She has never used smokeless tobacco. She reports that  she does not drink alcohol and does not use drugs. family history includes Asthma in her brother and paternal grandmother; Colon polyps (age of onset: 52) in her mother; Congestive Heart Failure in her father; Dementia (age of onset: 35) in her mother; Diabetes in her brother, father, maternal grandmother, mother, and paternal grandfather; Hypertension in her brother, brother, maternal grandmother, and mother; Lung disease in her paternal grandmother; Pancreatic cancer in her paternal aunt; Uterine cancer in her paternal aunt. Allergies  Allergen Reactions   Benicar  Hct [Olmesartan  Medoxomil-Hctz] Shortness Of Breath and Palpitations   Iodinated Contrast Media  Hives and Itching    02/02/2021-  pt developed 2 hives and itching even with the 13hr premedication.  Radiology PA came into eval the pt.  Developed itching and hives after injection on 05/10/20; needs 13hr prep in future   Crestor  [Rosuvastatin ] Other (See Comments)    Muscle aches   Bactrim  [Sulfamethoxazole -Trimethoprim ] Other (See Comments)    Abdominal pain, dizziness   Famotidine  Other (See Comments)    Reaction: Sore Throat   Pravachol  [Pravastatin ] Other (See Comments)    Lower abdominal pain   Shellfish Allergy Nausea And Vomiting   Amlodipine  Palpitations   Current Outpatient Medications on File Prior to Visit  Medication Sig Dispense Refill   acetaminophen  (TYLENOL ) 500 MG tablet Take 1,000 mg by mouth every 6 (six) hours as needed for moderate pain.     apixaban  (ELIQUIS ) 5 MG TABS tablet Take 1 tablet (5 mg total) by mouth 2 (two) times daily. 60 tablet 2   APPLE CIDER VINEGAR PO Take 5 mLs by mouth as needed.     atenolol  (TENORMIN ) 25 MG tablet Take 1 tablet (25 mg total) by mouth every evening. 30 tablet 2   benzonatate  (TESSALON ) 200 MG capsule Take 1 capsule (200 mg total) by mouth 2 (two) times daily as needed for cough. 20 capsule 0   Cholecalciferol (VITAMIN D3) 50 MCG (2000 UT) TABS Take by mouth.     EPINEPHRINE  0.3 mg/0.3 mL IJ SOAJ injection INJECT 0.3 MG INTO THE MUSCLE AS NEEDED FOR ANAPHYLAXIS. 2 each 1   Evolocumab  (REPATHA  SURECLICK) 140 MG/ML SOAJ Inject 140 mg into the skin every 14 (fourteen) days. 2 mL 11   indapamide  (LOZOL ) 1.25 MG tablet TAKE 1 TABLET BY MOUTH DAILY. (Patient not taking: Reported on 10/23/2024) 90 tablet 0   levocetirizine (XYZAL ) 5 MG tablet Take 1 tablet (5 mg total) by mouth 2 (two) times daily as needed (hives). (Patient not taking: Reported on 10/23/2024) 30 tablet 5   levothyroxine  (SYNTHROID ) 75 MCG tablet TAKE 1 TABLET BY MOUTH EVERY DAY IN THE MORNING 90 tablet 1   Multiple Vitamin (MULTIVITAMIN) capsule Take 1 capsule by mouth  daily.     NON FORMULARY Herbal Ginger and lemon tea     omeprazole  (PRILOSEC) 20 MG capsule Take 1 capsule (20 mg total) by mouth 2 (two) times daily before a meal. 60 capsule 1   zinc gluconate 50 MG tablet Take 50 mg by mouth daily.     No current facility-administered medications on file prior to visit.        ROS:  All others reviewed and negative.  Objective        PE:  BP 136/82 (BP Location: Left Arm, Patient Position: Sitting, Cuff Size: Normal)   Pulse 65   Temp 98.3 F (36.8 C) (Oral)   Ht 5' 4 (1.626 m)   Wt 218 lb (98.9 kg)   SpO2  99%   BMI 37.42 kg/m                 Constitutional: Pt appears in NAD               HENT: Head: NCAT.                Right Ear: External ear normal.                 Left Ear: External ear normal.                Eyes: . Pupils are equal, round, and reactive to light. Conjunctivae and EOM are normal               Nose: without d/c or deformity               Neck: Neck supple. Gross normal ROM               Cardiovascular: Normal rate and regular rhythm.                 Pulmonary/Chest: Effort normal and breath sounds without rales or wheezing. , does have mild tender to touch below and andjacent to port removal mild keloid type scar               Abd:  Soft, NT, ND, + BS, no organomegaly               Neurological: Pt is alert. At baseline orientation, motor grossly intact               Skin: Skin is warm. No rashes, no other new lesions, LE edema - none               Psychiatric: Pt behavior is normal without agitation   Micro: none  Cardiac tracings I have personally interpreted today:  none  Pertinent Radiological findings (summarize): none   Lab Results  Component Value Date   WBC 3.6 (L) 10/22/2024   HGB 12.8 10/22/2024   HCT 39.8 10/22/2024   PLT 234 10/22/2024   GLUCOSE 102 (H) 10/22/2024   CHOL 228 (H) 05/26/2024   TRIG 56.0 05/26/2024   HDL 81.80 05/26/2024   LDLCALC 135 (H) 05/26/2024   ALT 13 10/22/2024   AST 19  10/22/2024   NA 138 10/22/2024   K 3.9 10/22/2024   CL 104 10/22/2024   CREATININE 0.99 10/22/2024   BUN 9 10/22/2024   CO2 25 10/22/2024   TSH 2.58 05/26/2024   INR 1.0 01/28/2021   HGBA1C 5.4 11/12/2024   Assessment/Plan:  Tina Stone is a 74 y.o. Black or African American [2] female with  has a past medical history of Adenomatous colon polyp, Allergic rhinitis, seasonal, Allergy, Atrial fibrillation (HCC), Beta thalassemia trait (11/25/2013), Cholelithiasis, Class 3 obesity without serious comorbidity with body mass index (BMI) of 40.0 to 44.9 in adult (11/19/2012), DDD (degenerative disc disease), lumbar, endometrial ca (dx'd 08/2009), GERD (gastroesophageal reflux disease), H. pylori infection, History of radiation therapy, HLD (hyperlipidemia), Hypercholesterolemia, Hypertension (03/18/2017), Hypothyroidism, Intraductal papilloma of left breast, Morbid (severe) obesity due to excess calories (HCC) (09/27/2023), PONV (postoperative nausea and vomiting), Pre-diabetes, Sciatica, Spinal stenosis, Urticaria, and Uterine fibroid.  Vitamin D  deficiency Last vitamin D  Lab Results  Component Value Date   VD25OH 42.24 04/09/2022   Stable, cont oral replacement   Hyperglycemia Lab Results  Component Value Date   HGBA1C 5.4 11/12/2024   Stable, pt to  continue current medical treatment  - diet, wt control   Essential hypertension BP Readings from Last 3 Encounters:  11/12/24 136/82  10/29/24 126/84  10/23/24 136/78   Stable, pt to continue medical treatment tenormin  25 mg every day, lozol  1.25 mg qd   Chest pain Atypical, etiology unclear but very low suspicion for cardiac, likely MSK it seems but will have CXR to confirm, ok for tylenol  prn  Followup: Return if symptoms worsen or fail to improve.  Lynwood Rush, MD 11/12/2024 4:49 PM [facility]

## 2024-11-14 ENCOUNTER — Other Ambulatory Visit: Payer: Self-pay | Admitting: Family Medicine

## 2024-11-14 DIAGNOSIS — K219 Gastro-esophageal reflux disease without esophagitis: Secondary | ICD-10-CM

## 2024-11-27 ENCOUNTER — Other Ambulatory Visit: Payer: Self-pay | Admitting: Urgent Care

## 2024-11-27 ENCOUNTER — Other Ambulatory Visit: Payer: Self-pay | Admitting: Family

## 2024-11-27 ENCOUNTER — Other Ambulatory Visit: Payer: Self-pay | Admitting: Emergency Medicine

## 2024-11-27 DIAGNOSIS — I10 Essential (primary) hypertension: Secondary | ICD-10-CM

## 2024-11-27 DIAGNOSIS — R051 Acute cough: Secondary | ICD-10-CM

## 2024-11-27 DIAGNOSIS — J22 Unspecified acute lower respiratory infection: Secondary | ICD-10-CM

## 2024-11-27 DIAGNOSIS — I480 Paroxysmal atrial fibrillation: Secondary | ICD-10-CM

## 2024-12-01 ENCOUNTER — Other Ambulatory Visit: Payer: Self-pay

## 2024-12-01 ENCOUNTER — Telehealth: Payer: Self-pay

## 2024-12-01 DIAGNOSIS — I480 Paroxysmal atrial fibrillation: Secondary | ICD-10-CM

## 2024-12-01 DIAGNOSIS — I10 Essential (primary) hypertension: Secondary | ICD-10-CM

## 2024-12-01 MED ORDER — ATENOLOL 25 MG PO TABS: 25.0000 mg | ORAL_TABLET | Freq: Every evening | ORAL | 2 refills | Status: AC

## 2024-12-01 NOTE — Telephone Encounter (Signed)
 Copied from CRM #[id number]. Topic: Clinical - Prescription Issue >> Dec 01, 2024 11:32 AM Tina Stone wrote: Reason for CRM: Patient requested a refill for medication atenolol  (TENORMIN ) 25 MG tablet last week. Can Dr.Jones write a new prescription for it. She just saw him on 11/13. Patient has 2 tablets left.

## 2024-12-01 NOTE — Telephone Encounter (Signed)
 Spoke with patient, refills sent in

## 2024-12-05 ENCOUNTER — Other Ambulatory Visit: Payer: Self-pay | Admitting: Internal Medicine

## 2024-12-05 DIAGNOSIS — I700 Atherosclerosis of aorta: Secondary | ICD-10-CM

## 2024-12-05 DIAGNOSIS — E7800 Pure hypercholesterolemia, unspecified: Secondary | ICD-10-CM

## 2024-12-05 DIAGNOSIS — E785 Hyperlipidemia, unspecified: Secondary | ICD-10-CM

## 2024-12-07 ENCOUNTER — Telehealth: Payer: Self-pay

## 2024-12-07 ENCOUNTER — Telehealth: Payer: Self-pay | Admitting: Internal Medicine

## 2024-12-07 DIAGNOSIS — E785 Hyperlipidemia, unspecified: Secondary | ICD-10-CM

## 2024-12-07 DIAGNOSIS — I700 Atherosclerosis of aorta: Secondary | ICD-10-CM

## 2024-12-07 DIAGNOSIS — E7800 Pure hypercholesterolemia, unspecified: Secondary | ICD-10-CM

## 2024-12-07 NOTE — Telephone Encounter (Signed)
*  STAT* If patient is at the pharmacy, call can be transferred to refill team.   1. Which medications need to be refilled? (please list name of each medication and dose if known)  Evolocumab  (REPATHA  SURECLICK) 140 MG/ML SOAJ    2. Would you like to learn more about the convenience, safety, & potential cost savings by using the [facility]? no   3. Are you open to using the [facility] (Type [facility]. no   4. Which pharmacy/location (including street and city if local pharmacy) is medication to be sent to?  [id number] - [place], [place] - [address]    5. Do they need a 30 day or 90 day supply?

## 2024-12-07 NOTE — Telephone Encounter (Signed)
 There is no vaccine for Hep C

## 2024-12-07 NOTE — Telephone Encounter (Signed)
 Copied from CRM [id number]. Topic: Clinical - Medical Advice >> Dec 07, 2024 11:32 AM Nessti S wrote: Reason for CRM: pt called in because she wanted to schedule hep c shot but was terminated from employer before she can get the last series shot. She wanted to know what should she do because she wants to get the last shot. Call back number [phone]

## 2024-12-07 NOTE — Telephone Encounter (Signed)
 Can she come in the office and get this ?

## 2024-12-08 MED ORDER — REPATHA SURECLICK 140 MG/ML ~~LOC~~ SOAJ: 140.0000 mg | SUBCUTANEOUS | 1 refills | Status: AC

## 2024-12-08 NOTE — Telephone Encounter (Signed)
 Patient has been made aware that there is not Hep C vaccine. She states that she is sure that's what her job was giving her. I advised her again of Dr. Joshua comments she gave me a verbal understanding and states that she will call us  back about this.

## 2024-12-09 ENCOUNTER — Telehealth: Payer: Self-pay

## 2024-12-09 ENCOUNTER — Ambulatory Visit: Admitting: Obstetrics & Gynecology

## 2024-12-09 NOTE — Telephone Encounter (Signed)
 Copied from CRM #[id number]. Topic: Clinical - Medical Advice >> Dec 09, 2024 11:37 AM [name] wrote: Reason for CRM: patient called in asking if her insurance will cover a hep b vaccine, advised maybe she should reach out to insurance as well. Please callback patient with any info at [phone] .

## 2024-12-11 NOTE — Telephone Encounter (Signed)
 Please advise

## 2024-12-11 NOTE — Telephone Encounter (Unsigned)
 Copied from CRM [id number]. Topic: General - Other >> Dec 08, 2024  2:18 PM Lauren C wrote: Reason for CRM: Pt states she was speaking with Audreyanna Butkiewicz , Dashawn Bartnick M, CMA and is requesting to get back in touch with her. Please return call at [phone]

## 2024-12-11 NOTE — Telephone Encounter (Unsigned)
 Copied from CRM #[id number]. Topic: Clinical - Medication Question >> Dec 11, 2024 10:29 AM Dedra B wrote: Reason for CRM: Pt called to follow up on her request for the Hepatitis B vaccine. Pls let pt know via MyChart or phone when order has been placed so she can schedule.

## 2024-12-18 ENCOUNTER — Ambulatory Visit: Admitting: Family Medicine

## 2024-12-18 VITALS — BP 142/84 | HR 66 | Temp 98.6°F | Ht 64.0 in | Wt 219.0 lb

## 2024-12-18 DIAGNOSIS — I10 Essential (primary) hypertension: Secondary | ICD-10-CM | POA: Diagnosis not present

## 2024-12-18 DIAGNOSIS — R300 Dysuria: Secondary | ICD-10-CM

## 2024-12-18 DIAGNOSIS — Z23 Encounter for immunization: Secondary | ICD-10-CM

## 2024-12-18 LAB — POCT URINALYSIS DIP (CLINITEK)
Bilirubin, UA: NEGATIVE
Glucose, UA: NEGATIVE mg/dL
Ketones, POC UA: NEGATIVE mg/dL
Leukocytes, UA: NEGATIVE
Nitrite, UA: NEGATIVE
POC PROTEIN,UA: NEGATIVE
Spec Grav, UA: 1.01
Urobilinogen, UA: 0.2 U/dL
pH, UA: 6

## 2024-12-18 NOTE — Progress Notes (Signed)
 "      Acute Office Visit  Subjective:     Patient ID: Tina Stone, female    DOB: Nov 19, 1950, 74 y.o.   MRN: [id number]  Chief Complaint  Patient presents with   Acute Visit    C/o burning and itching ongoing for 3 days     HPI  Discussed the use of AI scribe software for clinical note transcription with the patient, who gave verbal consent to proceed.  History of Present Illness Tina Stone is a 74 year old female who presents with itching and burning symptoms.  Pruritus and burning sensation - Recent episode of itching and burning symptoms - Symptoms have fully resolved since scheduling the appointment - No current itching or burning - No home treatments used  Hepatitis b vaccination series - Incomplete hepatitis B vaccine series due to job termination - Uncertainty regarding [employer] and Medicare coverage for remaining dose - Requests guidance on how to complete the vaccine series     ROS Per HPI      Objective:    BP (!) 142/84 (BP Location: Left Arm, Patient Position: Sitting)   Pulse 66   Temp 98.6 F (37 C) (Temporal)   Ht 5' 4 (1.626 m)   Wt 219 lb (99.3 kg)   SpO2 99%   BMI 37.59 kg/m    Physical Exam Vitals and nursing note reviewed.  Constitutional:      General: She is not in acute distress.    Appearance: Normal appearance. She is obese.  HENT:     Head: Normocephalic and atraumatic.     Right Ear: External ear normal.     Left Ear: External ear normal.     Nose: Nose normal.     Mouth/Throat:     Mouth: Mucous membranes are moist.     Pharynx: Oropharynx is clear.  Eyes:     Extraocular Movements: Extraocular movements intact.     Pupils: Pupils are equal, round, and reactive to light.  Cardiovascular:     Rate and Rhythm: Normal rate.  Pulmonary:     Effort: Pulmonary effort is normal.  Genitourinary:    Comments: Declines exam today Musculoskeletal:        General: Normal range of motion.     Cervical  back: Normal range of motion.     Right lower leg: No edema.     Left lower leg: No edema.  Lymphadenopathy:     Cervical: No cervical adenopathy.  Neurological:     General: No focal deficit present.     Mental Status: She is alert and oriented to person, place, and time.  Psychiatric:        Mood and Affect: Mood normal.        Thought Content: Thought content normal.     Results for orders placed or performed in visit on 12/18/24  POCT URINALYSIS DIP (CLINITEK)  Result Value Ref Range   Color, UA yellow yellow   Clarity, UA clear clear   Glucose, UA negative negative mg/dL   Bilirubin, UA negative negative   Ketones, POC UA negative negative mg/dL   Spec Grav, UA 8.989 8.989 - 1.025   Blood, UA small (A) negative   pH, UA 6.0 5.0 - 8.0   POC PROTEIN,UA negative negative, trace   Urobilinogen, UA 0.2 0.2 or 1.0 E.U./dL   Nitrite, UA Negative Negative   Leukocytes, UA Negative Negative        Assessment & Plan:  Assessment and Plan Assessment & Plan Dysuria Intermittent dysuria, currently asymptomatic. Possible environmental irritation. No antibiotics needed. - Sent urine for culture to rule out infection.  Elevated blood pressure Blood pressure slightly elevated. - Rechecked blood pressure before leaving the office. - likely elevated bc she took her medication immediately before coming in to the office, has not had a chance to work this morning - Continue atenolol  and indapamide   Need for hep B vaccine Due for final hepatitis B vaccine. Insurance coverage issues discussed. - Advised to obtain final hepatitis B vaccine at a pharmacy.     Orders Placed This Encounter  Procedures   Urine Culture   POCT URINALYSIS DIP (CLINITEK)     No orders of the defined types were placed in this encounter.   Return if symptoms worsen or fail to improve.  Corean LITTIE Ku, FNP  "

## 2024-12-18 NOTE — Patient Instructions (Signed)
 Your urine was concerning for infection in the office today. We have sent it to the lab for culture and confirmation.  We can wait for your urine culture since you are not currently symptomatic today.  You are due for your third hepatitis B vaccination.  Please follow-up with the pharmacy and you may receive it there.  Insurance does not cover it in the office today.  Follow-up with me for new or worsening symptoms.

## 2024-12-19 LAB — URINE CULTURE: Result:: NO GROWTH

## 2024-12-20 ENCOUNTER — Encounter: Payer: Self-pay | Admitting: Internal Medicine

## 2024-12-21 ENCOUNTER — Ambulatory Visit: Payer: Self-pay | Admitting: Family Medicine

## 2024-12-22 ENCOUNTER — Encounter: Payer: Self-pay | Admitting: Internal Medicine

## 2025-01-08 ENCOUNTER — Ambulatory Visit (INDEPENDENT_AMBULATORY_CARE_PROVIDER_SITE_OTHER): Admitting: Physician Assistant

## 2025-01-08 ENCOUNTER — Ambulatory Visit

## 2025-01-08 ENCOUNTER — Encounter: Payer: Self-pay | Admitting: Physician Assistant

## 2025-01-08 VITALS — BP 124/80 | HR 69 | Temp 97.6°F | Ht 64.0 in | Wt 216.2 lb

## 2025-01-08 DIAGNOSIS — M25552 Pain in left hip: Secondary | ICD-10-CM

## 2025-01-08 NOTE — Progress Notes (Signed)
 "  History of Present Illness:   Chief Complaint  Patient presents with   Hip Pain    Pt c/o left hip pain radiating into left groin. Has not tried any medication.    Discussed the use of AI scribe software for clinical note transcription with the patient, who gave verbal consent to proceed.  History of Present Illness   Tina Stone is a 75 year old female who presents with groin pain radiating to the hip.  She has had mild intermittent left groin pain radiating to the hip since Monday. The pain occurs mainly at rest in the evening when preparing for bed, is not worsened by walking, and she has not used medication for relief. She regularly does water  aerobics and swimming and recently lifted a case of bottled water  at the store, which she wonders may have triggered the pain. She notes similar pain about once a year that resolves on its own. Prior spine x-ray for back pain showed degenerative changes.  She has history of endometrial cancer.   She has no recent urinary symptoms or bowel changes. A recent urine test was normal.        Past Medical History:  Diagnosis Date   Adenomatous colon polyp    Allergic rhinitis, seasonal    Allergy    Atrial fibrillation (HCC)    Beta thalassemia trait 11/25/2013   Cholelithiasis    Class 3 obesity without serious comorbidity with body mass index (BMI) of 40.0 to 44.9 in adult 11/19/2012   DDD (degenerative disc disease), lumbar    endometrial ca dx'd 08/2009   endometrial    GERD (gastroesophageal reflux disease)    H. pylori infection    History of radiation therapy    endometrial - 02/19/2022-03/23/2022 Dr Lynwood Nasuti   HLD (hyperlipidemia)    Hypercholesterolemia    Hypertension 03/18/2017   no meds    Hypothyroidism    Intraductal papilloma of left breast    Patient underwent left needle-localized lumpectomy by Dr. Donnice POUR. Tsuei on 09/09/2013; pathology showed intraductal papilloma with no atypia or malignancy identified.    Morbid (severe) obesity due to excess calories (HCC) 09/27/2023   bmi 40.38   PONV (postoperative nausea and vomiting)    Pre-diabetes    pt denies   Sciatica    Spinal stenosis    Urticaria    Uterine fibroid      Social History[1]  Past Surgical History:  Procedure Laterality Date   ANKLE FRACTURE SURGERY Right 1966   right   BREAST EXCISIONAL BIOPSY Left 2014   benign   BREAST LUMPECTOMY WITH NEEDLE LOCALIZATION Left 09/09/2013   Procedure: BREAST LUMPECTOMY WITH NEEDLE LOCALIZATION;  Surgeon: Donnice POUR. Belinda, MD;  Location: [facility];  Service: General;  Laterality: Left;   CHOLECYSTECTOMY     COLONOSCOPY     IR IMAGING GUIDED PORT INSERTION  05/18/2020   IR REMOVAL TUN ACCESS W/ PORT W/O FL MOD SED  01/02/2023   POLYPECTOMY     ROBOTIC ASSISTED LAPAROSCOPIC CHOLECYSTECTOMY  09/09/2019   ROBOTIC ASSISTED TOTAL HYSTERECTOMY WITH BILATERAL SALPINGO OOPHERECTOMY N/A 10/22/2019   Procedure: XI ROBOTIC ASSISTED TOTAL HYSTERECTOMY WITH BILATERAL SALPINGO OOPHORECTOMY GREATER THAN 250 GRAMS, MINI LAPARTOMY FOR SPECIMEN DELIVERY; PELVIC AND PERI-AORTIC LYMPHADENECTOMY;  Surgeon: Eloy Herring, MD;  Location: [facility];  Service: Gynecology;  Laterality: N/A;   SENTINEL NODE BIOPSY N/A 10/22/2019   Procedure: SENTINEL NODE BIOPSY;  Surgeon: Eloy Herring, MD;  Location: [facility];  Service:  Gynecology;  Laterality: N/A;    Family History  Problem Relation Age of Onset   Diabetes Mother    Hypertension Mother    Colon polyps Mother 39   Dementia Mother 14   Diabetes Father    Congestive Heart Failure Father    Asthma Brother    Diabetes Brother    Hypertension Brother    Hypertension Brother    Hypertension Maternal Grandmother    Diabetes Maternal Grandmother    Asthma Paternal Grandmother    Lung disease Paternal Grandmother    Diabetes Paternal Grandfather    Pancreatic cancer Paternal Aunt    Uterine cancer Paternal Aunt    Colon cancer Neg Hx    Breast cancer Neg Hx    Lung  cancer Neg Hx    Esophageal cancer Neg Hx    Rectal cancer Neg Hx    Stomach cancer Neg Hx     Allergies[2]  Current Medications:  Current Medications[3]   Review of Systems:   Negative unless otherwise specified per HPI.  Vitals:   Vitals:   01/08/25 0955  BP: 124/80  Pulse: 69  Temp: 97.6 F (36.4 C)  TempSrc: Temporal  SpO2: 99%  Weight: 216 lb 4 oz (98.1 kg)  Height: 5' 4 (1.626 m)     Body mass index is 37.12 kg/m.  Physical Exam:   Physical Exam Vitals and nursing note reviewed.  Constitutional:      General: She is not in acute distress.    Appearance: She is well-developed. She is not ill-appearing or toxic-appearing.  Cardiovascular:     Rate and Rhythm: Normal rate and regular rhythm.     Pulses: Normal pulses.     Heart sounds: Normal heart sounds, S1 normal and S2 normal.  Pulmonary:     Effort: Pulmonary effort is normal.     Breath sounds: Normal breath sounds.  Musculoskeletal:     Comments: No tenderness to palpation to left groin area or left hip area No tenderness to palpation to lumbar spine Normal gait Pain elicited with passive flexion of left leg Normal range of motion   Skin:    General: Skin is warm and dry.  Neurological:     Mental Status: She is alert.     GCS: GCS eye subscore is 4. GCS verbal subscore is 5. GCS motor subscore is 6.  Psychiatric:        Speech: Speech normal.        Behavior: Behavior normal. Behavior is cooperative.     Assessment and Plan:   Assessment and Plan    Left hip pain Intermittent mild pain radiating to groin, likely musculoskeletal strain or degenerative changes. No obvious red flag symptom(s). - Ordered left hip x-ray. - Advised monitoring symptoms for two weeks; consider sports medicine referral if unresolved.      Lucie Buttner, PA-C    [1]  Social History Tobacco Use   Smoking status: Never   Smokeless tobacco: Never   Tobacco comments:    few puffs but not a true smoker  quit many yrs ago  Vaping Use   Vaping status: Never Used  Substance Use Topics   Alcohol use: Never   Drug use: Never  [2]  Allergies Allergen Reactions   Benicar  Hct [Olmesartan  Medoxomil-Hctz] Shortness Of Breath and Palpitations   Iodinated Contrast Media Hives and Itching    02/02/2021-  pt developed 2 hives and itching even with the 13hr premedication.  Radiology PA came into  eval the pt.  Developed itching and hives after injection on 05/10/20; needs 13hr prep in future   Crestor  [Rosuvastatin ] Other (See Comments)    Muscle aches   Bactrim  [Sulfamethoxazole -Trimethoprim ] Other (See Comments)    Abdominal pain, dizziness   Famotidine  Other (See Comments)    Reaction: Sore Throat   Pravachol  [Pravastatin ] Other (See Comments)    Lower abdominal pain   Shellfish Allergy Nausea And Vomiting   Amlodipine  Palpitations  [3]  Current Outpatient Medications:    acetaminophen  (TYLENOL ) 500 MG tablet, Take 1,000 mg by mouth every 6 (six) hours as needed for moderate pain., Disp: , Rfl:    apixaban  (ELIQUIS ) 5 MG TABS tablet, Take 1 tablet (5 mg total) by mouth 2 (two) times daily., Disp: 60 tablet, Rfl: 2   APPLE CIDER VINEGAR PO, Take 5 mLs by mouth as needed., Disp: , Rfl:    atenolol  (TENORMIN ) 25 MG tablet, Take 1 tablet (25 mg total) by mouth every evening., Disp: 90 tablet, Rfl: 2   Cholecalciferol (VITAMIN D3) 50 MCG (2000 UT) TABS, Take by mouth., Disp: , Rfl:    EPINEPHRINE  0.3 mg/0.3 mL IJ SOAJ injection, INJECT 0.3 MG INTO THE MUSCLE AS NEEDED FOR ANAPHYLAXIS., Disp: 2 each, Rfl: 1   Evolocumab  (REPATHA  SURECLICK) 140 MG/ML SOAJ, Inject 140 mg into the skin every 14 (fourteen) days., Disp: 6 mL, Rfl: 1   levothyroxine  (SYNTHROID ) 75 MCG tablet, TAKE 1 TABLET BY MOUTH EVERY DAY IN THE MORNING, Disp: 90 tablet, Rfl: 1   Multiple Vitamin (MULTIVITAMIN) capsule, Take 1 capsule by mouth daily., Disp: , Rfl:    NON FORMULARY, Herbal Ginger and lemon tea, Disp: , Rfl:    omeprazole   (PRILOSEC) 20 MG capsule, TAKE 1 CAPSULE (20 MG TOTAL) BY MOUTH 2 (TWO) TIMES DAILY BEFORE A MEAL. (Patient taking differently: Take 20 mg by mouth 2 (two) times daily before a meal. PRN), Disp: 180 capsule, Rfl: 1   zinc gluconate 50 MG tablet, Take 50 mg by mouth daily., Disp: , Rfl:   "

## 2025-01-08 NOTE — Patient Instructions (Signed)
 It was great to see you!  We will get xray today and be in touch with results  If any sudden changes/worsening -- let us  know right away  Take care,  Rinaldo Macqueen PA-C

## 2025-01-09 ENCOUNTER — Other Ambulatory Visit: Payer: Self-pay | Admitting: Internal Medicine

## 2025-01-09 DIAGNOSIS — K219 Gastro-esophageal reflux disease without esophagitis: Secondary | ICD-10-CM

## 2025-01-15 ENCOUNTER — Ambulatory Visit: Payer: Self-pay | Admitting: Physician Assistant

## 2025-01-28 ENCOUNTER — Other Ambulatory Visit: Payer: Self-pay | Admitting: Internal Medicine

## 2025-05-10 ENCOUNTER — Ambulatory Visit: Admitting: Internal Medicine

## 2025-05-27 ENCOUNTER — Ambulatory Visit

## 2025-05-27 ENCOUNTER — Encounter: Admitting: Internal Medicine

## 2025-06-22 ENCOUNTER — Other Ambulatory Visit

## 2025-06-29 ENCOUNTER — Ambulatory Visit: Admitting: Hematology and Oncology
